# Patient Record
Sex: Female | Born: 1965 | Race: Black or African American | Hispanic: No | Smoking: Current every day smoker
Health system: Southern US, Community
[De-identification: ages and names within clinical notes are randomized; demographics above are authoritative.]

## PROBLEM LIST (undated history)

## (undated) DIAGNOSIS — F319 Bipolar disorder, unspecified: Secondary | ICD-10-CM

## (undated) DIAGNOSIS — G894 Chronic pain syndrome: Secondary | ICD-10-CM

## (undated) DIAGNOSIS — Z460 Encounter for fitting and adjustment of spectacles and contact lenses: Secondary | ICD-10-CM

## (undated) DIAGNOSIS — E785 Hyperlipidemia, unspecified: Secondary | ICD-10-CM

## (undated) DIAGNOSIS — F419 Anxiety disorder, unspecified: Secondary | ICD-10-CM

## (undated) DIAGNOSIS — R61 Generalized hyperhidrosis: Secondary | ICD-10-CM

## (undated) DIAGNOSIS — I10 Essential (primary) hypertension: Secondary | ICD-10-CM

## (undated) DIAGNOSIS — K279 Peptic ulcer, site unspecified, unspecified as acute or chronic, without hemorrhage or perforation: Secondary | ICD-10-CM

## (undated) DIAGNOSIS — K219 Gastro-esophageal reflux disease without esophagitis: Secondary | ICD-10-CM

## (undated) DIAGNOSIS — D509 Iron deficiency anemia, unspecified: Secondary | ICD-10-CM

## (undated) DIAGNOSIS — M75121 Complete rotator cuff tear or rupture of right shoulder, not specified as traumatic: Secondary | ICD-10-CM

## (undated) DIAGNOSIS — M5137 Other intervertebral disc degeneration, lumbosacral region: Secondary | ICD-10-CM

## (undated) HISTORY — DX: Iron deficiency anemia, unspecified: D50.9

## (undated) HISTORY — DX: Hyperlipidemia, unspecified: E78.5

## (undated) HISTORY — DX: Chronic pain syndrome: G89.4

## (undated) HISTORY — DX: Anxiety disorder, unspecified: F41.9

## (undated) HISTORY — DX: Peptic ulcer, site unspecified, unspecified as acute or chronic, without hemorrhage or perforation: K27.9

## (undated) HISTORY — DX: Essential (primary) hypertension: I10

## (undated) HISTORY — DX: Encounter for fitting and adjustment of spectacles and contact lenses: Z46.0

## (undated) HISTORY — PX: TUBAL LIGATION: SHX77

## (undated) HISTORY — PX: BLADDER SURGERY: SHX569

## (undated) HISTORY — DX: Bipolar disorder, unspecified: F31.9

## (undated) HISTORY — DX: Other intervertebral disc degeneration, lumbosacral region: M51.37

## (undated) HISTORY — PX: ABDOMINAL HYSTERECTOMY: SHX81

## (undated) HISTORY — DX: Morbid (severe) obesity due to excess calories: E66.01

## (undated) HISTORY — DX: Generalized hyperhidrosis: R61

---

## 1989-02-23 HISTORY — PX: HAND TENDON SURGERY: SHX663

## 2008-05-13 ENCOUNTER — Observation Stay (HOSPITAL_COMMUNITY): Admission: EM | Admit: 2008-05-13 | Discharge: 2008-05-14 | Payer: Self-pay | Admitting: Emergency Medicine

## 2008-05-22 ENCOUNTER — Other Ambulatory Visit: Payer: Self-pay

## 2008-05-22 ENCOUNTER — Inpatient Hospital Stay (HOSPITAL_COMMUNITY): Admission: RE | Admit: 2008-05-22 | Discharge: 2008-05-25 | Payer: Self-pay | Admitting: Psychiatry

## 2008-05-22 ENCOUNTER — Ambulatory Visit: Payer: Self-pay | Admitting: Psychiatry

## 2008-05-22 ENCOUNTER — Other Ambulatory Visit: Payer: Self-pay | Admitting: Emergency Medicine

## 2008-06-27 ENCOUNTER — Emergency Department (HOSPITAL_COMMUNITY): Admission: EM | Admit: 2008-06-27 | Discharge: 2008-06-27 | Payer: Self-pay | Admitting: Emergency Medicine

## 2008-06-27 ENCOUNTER — Encounter (INDEPENDENT_AMBULATORY_CARE_PROVIDER_SITE_OTHER): Payer: Self-pay | Admitting: *Deleted

## 2008-07-25 ENCOUNTER — Ambulatory Visit (HOSPITAL_COMMUNITY): Admission: RE | Admit: 2008-07-25 | Discharge: 2008-07-25 | Payer: Self-pay | Admitting: Obstetrics and Gynecology

## 2008-08-13 DIAGNOSIS — D259 Leiomyoma of uterus, unspecified: Secondary | ICD-10-CM | POA: Insufficient documentation

## 2008-08-13 DIAGNOSIS — J45909 Unspecified asthma, uncomplicated: Secondary | ICD-10-CM | POA: Insufficient documentation

## 2008-08-13 HISTORY — DX: Leiomyoma of uterus, unspecified: D25.9

## 2008-09-12 ENCOUNTER — Encounter (INDEPENDENT_AMBULATORY_CARE_PROVIDER_SITE_OTHER): Payer: Self-pay | Admitting: Obstetrics and Gynecology

## 2008-09-12 ENCOUNTER — Ambulatory Visit (HOSPITAL_COMMUNITY): Admission: RE | Admit: 2008-09-12 | Discharge: 2008-09-12 | Payer: Self-pay | Admitting: Obstetrics and Gynecology

## 2008-11-23 LAB — CONVERTED CEMR LAB: Pap Smear: NORMAL

## 2009-01-25 ENCOUNTER — Ambulatory Visit: Payer: Self-pay | Admitting: Internal Medicine

## 2009-01-25 DIAGNOSIS — M51379 Other intervertebral disc degeneration, lumbosacral region without mention of lumbar back pain or lower extremity pain: Secondary | ICD-10-CM

## 2009-01-25 DIAGNOSIS — M5137 Other intervertebral disc degeneration, lumbosacral region: Secondary | ICD-10-CM

## 2009-01-25 DIAGNOSIS — F319 Bipolar disorder, unspecified: Secondary | ICD-10-CM

## 2009-01-25 DIAGNOSIS — I10 Essential (primary) hypertension: Secondary | ICD-10-CM

## 2009-01-25 DIAGNOSIS — D509 Iron deficiency anemia, unspecified: Secondary | ICD-10-CM

## 2009-01-25 DIAGNOSIS — E785 Hyperlipidemia, unspecified: Secondary | ICD-10-CM

## 2009-01-25 DIAGNOSIS — M549 Dorsalgia, unspecified: Secondary | ICD-10-CM | POA: Insufficient documentation

## 2009-01-25 DIAGNOSIS — M51369 Other intervertebral disc degeneration, lumbar region without mention of lumbar back pain or lower extremity pain: Secondary | ICD-10-CM | POA: Insufficient documentation

## 2009-01-25 DIAGNOSIS — R5383 Other fatigue: Secondary | ICD-10-CM

## 2009-01-25 DIAGNOSIS — J309 Allergic rhinitis, unspecified: Secondary | ICD-10-CM

## 2009-01-25 DIAGNOSIS — R5381 Other malaise: Secondary | ICD-10-CM | POA: Insufficient documentation

## 2009-01-25 DIAGNOSIS — F17200 Nicotine dependence, unspecified, uncomplicated: Secondary | ICD-10-CM

## 2009-01-25 DIAGNOSIS — M5136 Other intervertebral disc degeneration, lumbar region: Secondary | ICD-10-CM | POA: Insufficient documentation

## 2009-01-25 HISTORY — DX: Essential (primary) hypertension: I10

## 2009-01-25 HISTORY — DX: Dorsalgia, unspecified: M54.9

## 2009-01-25 HISTORY — DX: Other intervertebral disc degeneration, lumbosacral region: M51.37

## 2009-01-25 HISTORY — DX: Iron deficiency anemia, unspecified: D50.9

## 2009-01-25 HISTORY — DX: Morbid (severe) obesity due to excess calories: E66.01

## 2009-01-25 HISTORY — DX: Allergic rhinitis, unspecified: J30.9

## 2009-01-25 HISTORY — DX: Hyperlipidemia, unspecified: E78.5

## 2009-01-25 HISTORY — DX: Nicotine dependence, unspecified, uncomplicated: F17.200

## 2009-01-25 HISTORY — DX: Bipolar disorder, unspecified: F31.9

## 2009-01-25 HISTORY — DX: Other intervertebral disc degeneration, lumbosacral region without mention of lumbar back pain or lower extremity pain: M51.379

## 2009-01-29 LAB — CONVERTED CEMR LAB
ALT: 20 units/L (ref 0–35)
AST: 15 units/L (ref 0–37)
Albumin: 3.3 g/dL — ABNORMAL LOW (ref 3.5–5.2)
Alkaline Phosphatase: 79 units/L (ref 39–117)
BUN: 3 mg/dL — ABNORMAL LOW (ref 6–23)
Basophils Absolute: 0 10*3/uL (ref 0.0–0.1)
Basophils Relative: 0.1 % (ref 0.0–3.0)
Bilirubin Urine: NEGATIVE
Bilirubin, Direct: 0 mg/dL (ref 0.0–0.3)
CO2: 31 meq/L (ref 19–32)
Calcium: 8.7 mg/dL (ref 8.4–10.5)
Chloride: 107 meq/L (ref 96–112)
Cholesterol: 232 mg/dL — ABNORMAL HIGH (ref 0–200)
Creatinine, Ser: 0.7 mg/dL (ref 0.4–1.2)
Direct LDL: 187.8 mg/dL
Eosinophils Absolute: 0.1 10*3/uL (ref 0.0–0.7)
Eosinophils Relative: 2.3 % (ref 0.0–5.0)
Folate: 11.2 ng/mL
GFR calc non Af Amer: 116.59 mL/min (ref >60)
Glucose, Bld: 89 mg/dL (ref 70–99)
HCT: 39.7 % (ref 36.0–46.0)
HDL: 32.8 mg/dL — ABNORMAL LOW (ref >39.00)
Hemoglobin, Urine: NEGATIVE
Hemoglobin: 13.4 g/dL (ref 12.0–15.0)
Iron: 30 ug/dL — ABNORMAL LOW (ref 42–145)
Ketones, ur: NEGATIVE mg/dL
Leukocytes, UA: NEGATIVE
Lymphocytes Relative: 23.1 % (ref 12.0–46.0)
Lymphs Abs: 1.5 10*3/uL (ref 0.7–4.0)
MCHC: 33.7 g/dL (ref 30.0–36.0)
MCV: 88.1 fL (ref 78.0–100.0)
Monocytes Absolute: 0.2 10*3/uL (ref 0.1–1.0)
Monocytes Relative: 3.4 % (ref 3.0–12.0)
Neutro Abs: 4.5 10*3/uL (ref 1.4–7.7)
Neutrophils Relative %: 71.1 % (ref 43.0–77.0)
Nitrite: NEGATIVE
Platelets: 239 10*3/uL (ref 150.0–400.0)
Potassium: 3.7 meq/L (ref 3.5–5.1)
RBC: 4.51 M/uL (ref 3.87–5.11)
RDW: 14.4 % (ref 11.5–14.6)
Saturation Ratios: 8.3 % — ABNORMAL LOW (ref 20.0–50.0)
Sed Rate: 80 mm/hr — ABNORMAL HIGH (ref 0–22)
Sodium: 143 meq/L (ref 135–145)
Specific Gravity, Urine: 1.01 (ref 1.000–1.030)
TSH: 1.54 microintl units/mL (ref 0.35–5.50)
Total Bilirubin: 0.5 mg/dL (ref 0.3–1.2)
Total CHOL/HDL Ratio: 7
Total Protein, Urine: NEGATIVE mg/dL
Total Protein: 7.9 g/dL (ref 6.0–8.3)
Transferrin: 258.5 mg/dL (ref 212.0–360.0)
Triglycerides: 94 mg/dL (ref 0.0–149.0)
Urine Glucose: NEGATIVE mg/dL
Urobilinogen, UA: 0.2 (ref 0.0–1.0)
VLDL: 18.8 mg/dL (ref 0.0–40.0)
Vitamin B-12: 659 pg/mL (ref 211–911)
WBC: 6.3 10*3/uL (ref 4.5–10.5)
pH: 7 (ref 5.0–8.0)

## 2009-02-25 ENCOUNTER — Ambulatory Visit: Payer: Self-pay | Admitting: Internal Medicine

## 2009-02-25 ENCOUNTER — Telehealth: Payer: Self-pay | Admitting: Internal Medicine

## 2009-02-26 ENCOUNTER — Ambulatory Visit (HOSPITAL_COMMUNITY): Admission: RE | Admit: 2009-02-26 | Discharge: 2009-02-26 | Payer: Self-pay | Admitting: Surgery

## 2009-02-26 ENCOUNTER — Encounter (INDEPENDENT_AMBULATORY_CARE_PROVIDER_SITE_OTHER): Payer: Self-pay | Admitting: *Deleted

## 2009-02-27 ENCOUNTER — Ambulatory Visit: Payer: Self-pay | Admitting: Internal Medicine

## 2009-02-28 ENCOUNTER — Encounter: Payer: Self-pay | Admitting: Internal Medicine

## 2009-02-28 ENCOUNTER — Telehealth (INDEPENDENT_AMBULATORY_CARE_PROVIDER_SITE_OTHER): Payer: Self-pay | Admitting: *Deleted

## 2009-03-01 ENCOUNTER — Ambulatory Visit: Payer: Self-pay | Admitting: Internal Medicine

## 2009-03-01 ENCOUNTER — Telehealth: Payer: Self-pay | Admitting: Internal Medicine

## 2009-03-01 LAB — CONVERTED CEMR LAB
HCV Ab: NEGATIVE
Hep A IgM: NEGATIVE
Hep B C IgM: NEGATIVE
Hepatitis B Surface Ag: NEGATIVE
Rubella: 24.9 intl units/mL — ABNORMAL HIGH
Rubeola IgG: 2.03 — ABNORMAL HIGH

## 2009-03-15 ENCOUNTER — Telehealth: Payer: Self-pay | Admitting: Internal Medicine

## 2009-03-29 ENCOUNTER — Ambulatory Visit: Payer: Self-pay | Admitting: Internal Medicine

## 2009-04-02 ENCOUNTER — Telehealth: Payer: Self-pay | Admitting: Internal Medicine

## 2009-04-30 ENCOUNTER — Ambulatory Visit: Payer: Self-pay | Admitting: Internal Medicine

## 2009-04-30 DIAGNOSIS — G4700 Insomnia, unspecified: Secondary | ICD-10-CM

## 2009-04-30 HISTORY — DX: Insomnia, unspecified: G47.00

## 2009-05-31 ENCOUNTER — Ambulatory Visit: Payer: Self-pay | Admitting: Internal Medicine

## 2009-05-31 DIAGNOSIS — R51 Headache: Secondary | ICD-10-CM | POA: Insufficient documentation

## 2009-05-31 DIAGNOSIS — R519 Headache, unspecified: Secondary | ICD-10-CM | POA: Insufficient documentation

## 2009-06-03 ENCOUNTER — Encounter (INDEPENDENT_AMBULATORY_CARE_PROVIDER_SITE_OTHER): Payer: Self-pay | Admitting: *Deleted

## 2009-06-04 ENCOUNTER — Telehealth (INDEPENDENT_AMBULATORY_CARE_PROVIDER_SITE_OTHER): Payer: Self-pay | Admitting: *Deleted

## 2009-07-02 ENCOUNTER — Ambulatory Visit: Payer: Self-pay | Admitting: Internal Medicine

## 2009-07-15 ENCOUNTER — Telehealth: Payer: Self-pay | Admitting: Internal Medicine

## 2009-07-16 ENCOUNTER — Telehealth: Payer: Self-pay | Admitting: Internal Medicine

## 2009-07-26 ENCOUNTER — Ambulatory Visit: Payer: Self-pay | Admitting: Internal Medicine

## 2009-09-16 ENCOUNTER — Ambulatory Visit: Payer: Self-pay | Admitting: Internal Medicine

## 2009-10-03 ENCOUNTER — Telehealth: Payer: Self-pay | Admitting: Internal Medicine

## 2009-10-03 DIAGNOSIS — K279 Peptic ulcer, site unspecified, unspecified as acute or chronic, without hemorrhage or perforation: Secondary | ICD-10-CM

## 2009-10-03 HISTORY — DX: Peptic ulcer, site unspecified, unspecified as acute or chronic, without hemorrhage or perforation: K27.9

## 2009-10-04 ENCOUNTER — Encounter: Payer: Self-pay | Admitting: Gastroenterology

## 2009-10-15 ENCOUNTER — Encounter: Admission: RE | Admit: 2009-10-15 | Discharge: 2009-11-21 | Payer: Self-pay | Admitting: Surgery

## 2009-11-06 ENCOUNTER — Encounter (INDEPENDENT_AMBULATORY_CARE_PROVIDER_SITE_OTHER): Payer: Self-pay | Admitting: *Deleted

## 2009-11-06 ENCOUNTER — Ambulatory Visit: Payer: Self-pay | Admitting: Internal Medicine

## 2009-11-06 DIAGNOSIS — F1910 Other psychoactive substance abuse, uncomplicated: Secondary | ICD-10-CM | POA: Insufficient documentation

## 2009-11-06 HISTORY — DX: Other psychoactive substance abuse, uncomplicated: F19.10

## 2009-11-06 LAB — CONVERTED CEMR LAB
ALT: 14 units/L (ref 0–35)
AST: 15 units/L (ref 0–37)
Albumin: 3.3 g/dL — ABNORMAL LOW (ref 3.5–5.2)
Alkaline Phosphatase: 87 units/L (ref 39–117)
Amphetamine Screen, Ur: NEGATIVE
BUN: 8 mg/dL (ref 6–23)
Barbiturate Quant, Ur: NEGATIVE
Basophils Absolute: 0 10*3/uL (ref 0.0–0.1)
Basophils Relative: 0.1 % (ref 0.0–3.0)
Benzodiazepines.: NEGATIVE
Bilirubin Urine: NEGATIVE
Bilirubin, Direct: 0.1 mg/dL (ref 0.0–0.3)
CO2: 26 meq/L (ref 19–32)
Calcium: 9.2 mg/dL (ref 8.4–10.5)
Chloride: 109 meq/L (ref 96–112)
Cholesterol: 244 mg/dL — ABNORMAL HIGH (ref 0–200)
Cocaine Metabolites: NEGATIVE
Creatinine, Ser: 0.7 mg/dL (ref 0.4–1.2)
Creatinine,U: 162.7 mg/dL
Direct LDL: 184.8 mg/dL
Eosinophils Absolute: 0.2 10*3/uL (ref 0.0–0.7)
Eosinophils Relative: 2.4 % (ref 0.0–5.0)
Folate: 11.1 ng/mL
GFR calc non Af Amer: 116.18 mL/min (ref >60)
Glucose, Bld: 77 mg/dL (ref 70–99)
HCT: 38 % (ref 36.0–46.0)
HDL: 36.1 mg/dL — ABNORMAL LOW (ref >39.00)
Hemoglobin, Urine: NEGATIVE
Hemoglobin: 13.3 g/dL (ref 12.0–15.0)
Hgb A1c MFr Bld: 5.7 % (ref 4.6–6.5)
Iron: 48 ug/dL (ref 42–145)
Ketones, ur: NEGATIVE mg/dL
Leukocytes, UA: NEGATIVE
Lymphocytes Relative: 23.3 % (ref 12.0–46.0)
Lymphs Abs: 1.7 10*3/uL (ref 0.7–4.0)
MCHC: 35 g/dL (ref 30.0–36.0)
MCV: 86 fL (ref 78.0–100.0)
Marijuana Metabolite: NEGATIVE
Methadone: NEGATIVE
Monocytes Absolute: 0.3 10*3/uL (ref 0.1–1.0)
Monocytes Relative: 4.7 % (ref 3.0–12.0)
Neutro Abs: 5 10*3/uL (ref 1.4–7.7)
Neutrophils Relative %: 69.5 % (ref 43.0–77.0)
Nitrite: NEGATIVE
Opiates: NEGATIVE
Phencyclidine (PCP): NEGATIVE
Platelets: 300 10*3/uL (ref 150.0–400.0)
Potassium: 4.2 meq/L (ref 3.5–5.1)
Propoxyphene: NEGATIVE
RBC: 4.43 M/uL (ref 3.87–5.11)
RDW: 14.8 % — ABNORMAL HIGH (ref 11.5–14.6)
Saturation Ratios: 13.6 % — ABNORMAL LOW (ref 20.0–50.0)
Sodium: 142 meq/L (ref 135–145)
Specific Gravity, Urine: 1.02 (ref 1.000–1.030)
TSH: 1.9 microintl units/mL (ref 0.35–5.50)
Total Bilirubin: 0.6 mg/dL (ref 0.3–1.2)
Total CHOL/HDL Ratio: 7
Total Protein, Urine: NEGATIVE mg/dL
Total Protein: 7.4 g/dL (ref 6.0–8.3)
Transferrin: 252.7 mg/dL (ref 212.0–360.0)
Triglycerides: 151 mg/dL — ABNORMAL HIGH (ref 0.0–149.0)
Urine Glucose: NEGATIVE mg/dL
Urobilinogen, UA: 0.2 (ref 0.0–1.0)
VLDL: 30.2 mg/dL (ref 0.0–40.0)
Vitamin B-12: 470 pg/mL (ref 211–911)
WBC: 7.2 10*3/uL (ref 4.5–10.5)
pH: 5.5 (ref 5.0–8.0)

## 2009-11-19 ENCOUNTER — Ambulatory Visit: Payer: Self-pay | Admitting: Gastroenterology

## 2009-11-20 ENCOUNTER — Ambulatory Visit: Payer: Self-pay | Admitting: Gastroenterology

## 2009-11-22 ENCOUNTER — Encounter: Payer: Self-pay | Admitting: Physician Assistant

## 2009-12-05 ENCOUNTER — Telehealth: Payer: Self-pay | Admitting: Physician Assistant

## 2009-12-25 ENCOUNTER — Telehealth: Payer: Self-pay | Admitting: Gastroenterology

## 2010-01-21 ENCOUNTER — Telehealth: Payer: Self-pay | Admitting: Internal Medicine

## 2010-01-27 ENCOUNTER — Ambulatory Visit (HOSPITAL_COMMUNITY)
Admission: RE | Admit: 2010-01-27 | Discharge: 2010-01-27 | Payer: Self-pay | Source: Home / Self Care | Admitting: Surgery

## 2010-02-12 ENCOUNTER — Encounter: Payer: Self-pay | Admitting: Internal Medicine

## 2010-02-27 ENCOUNTER — Ambulatory Visit (HOSPITAL_COMMUNITY)
Admission: RE | Admit: 2010-02-27 | Discharge: 2010-02-27 | Payer: Self-pay | Source: Home / Self Care | Attending: Internal Medicine | Admitting: Internal Medicine

## 2010-02-27 LAB — HM MAMMOGRAPHY: HM Mammogram: NEGATIVE

## 2010-03-16 ENCOUNTER — Encounter: Payer: Self-pay | Admitting: Surgery

## 2010-03-25 NOTE — Assessment & Plan Note (Signed)
Summary: DISCUSS WEIGHT LOSS AND DISABILITY/NWS  #   Vital Signs:  Patient profile:   45 year old female Height:      63 inches Weight:      261 pounds BMI:     46.40 O2 Sat:      96 % on Room air Temp:     97.6 degrees F oral Pulse rate:   88 / minute Pulse rhythm:   regular Resp:     16 per minute BP sitting:   130 / 78  (left arm) Cuff size:   large  Vitals Entered By: Rock Nephew CMA (May 31, 2009 1:14 PM)  Nutrition Counseling: Patient's BMI is greater than 25 and therefore counseled on weight management options.  O2 Flow:  Room air CC: discuss headache x3wks w/ fatgue and "out burst", Hypertension Management   Primary Care Provider:  Corwin Levins MD  CC:  discuss headache x3wks w/ fatgue and "out burst" and Hypertension Management.  History of Present Illness: New to me this pleasant young female says that she was supposed to see Dr. Jonny Ruiz today. She has chronic daily headaches and saw a HA specialist in [place] and wants a referral to a new HA specialist. She needs a letter today stating that she is treated at [facility] for bipolar disease. Her next appt. there is in one month. She was admiited to Psych. last November for suicidal ideations and has been doing well under their care since then. She is scheduled for a lap-band procedure soon and says that she is following a diet given to her by Dr. Jonny Ruiz diligently. She does not exercise due to LBP from an old worker's comp. injury.  Hypertension History:      She denies headache, chest pain, palpitations, dyspnea with exertion, orthopnea, PND, peripheral edema, visual symptoms, neurologic problems, syncope, and side effects from treatment.  She notes no problems with any antihypertensive medication side effects.        Positive major cardiovascular risk factors include hyperlipidemia, hypertension, and current tobacco user.  Negative major cardiovascular risk factors include female age less than 76 years old, no history of diabetes,  and negative family history for ischemic heart disease.        Further assessment for target organ damage reveals no history of ASHD, cardiac end-organ damage (CHF/LVH), stroke/TIA, peripheral vascular disease, renal insufficiency, or hypertensive retinopathy.     Current Medications (verified): 1)  Gabapentin 400 Mg Caps (Gabapentin) .Marland Kitchen.. 1 By Mouth Once Daily 2)  Clonazepam 0.5 Mg Tabs (Clonazepam) .... Three Times A Day As Needed 3)  Lexapro 10 Mg Tabs (Escitalopram Oxalate) .Marland Kitchen.. 1 By Mouth Every Morning 4)  Hydrocodone-Acetaminophen 5-325 Mg Tabs (Hydrocodone-Acetaminophen) .Marland Kitchen.. 1 By Mouth Three Times A Day As Needed 5)  Losartan Potassium 100 Mg Tabs (Losartan Potassium) .Marland Kitchen.. 1 By Mouth Once Daily 6)  Seroquel 50 Mg Tabs (Quetiapine Fumarate) .Marland Kitchen.. 1 By Mouth At Bedtime 7)  Phentermine Hcl 37.5 Mg Caps (Phentermine Hcl) .Marland Kitchen.. 1po Once Daily 8)  Cephalexin 500 Mg Caps (Cephalexin) .Marland Kitchen.. 1 By Mouth Three Times A Day 9)  Meclizine Hcl 12.5 Mg Tabs (Meclizine Hcl) .Marland Kitchen.. 1 - 2 By Mouth Q 6 Hrs As Needed 10)  Promethazine Hcl 25 Mg Tabs (Promethazine Hcl) .Marland Kitchen.. 1 By Mouth Q 6 Hrs As Needed Nausea  Allergies (verified): No Known Drug Allergies  Past History:  Past Medical History: Reviewed history from 01/25/2009 and no changes required. Anemia-iron deficiency Bipolar Illness Allergic rhinitis Hyperlipidemia  Hypertension HIV neg 2001 Lumbar disc disease chronic pain syndrome chronic smoker morbid obesity  Past Surgical History: Reviewed history from 01/25/2009 and no changes required. s/p bladder surgury with ? diverticulitis Hysterectomy Tubal ligation s/p right hand tendon surgury- index and middle  Family History: Reviewed history from 01/25/2009 and no changes required. father died iwth ETOH cirrhosis, stroke, HTN mother with HTN sister and p-aunt overwt  and HTN aunt with DM uncle with DM maternal - several with emotional illness but not dx or tx son and 3 nephews with  ADHD, and emotional issues  Social History: Reviewed history from 01/25/2009 and no changes required. moved from [place], then [place] to [place] 2009 Divorced 2 chidren disabled - bipolar Current Smoker Alcohol use-no no illiict drugs  Review of Systems       The patient complains of weight gain.  The patient denies chest pain, abdominal pain, and depression.   Psych:  Denies alternate hallucination ( auditory/visual), anxiety, depression, easily angered, easily tearful, irritability, mental problems, panic attacks, sense of great danger, suicidal thoughts/plans, thoughts of violence, and unusual visions or sounds.  Physical Exam  General:  alert, well-developed, well-nourished, well-hydrated, appropriate dress, cooperative to examination, and overweight-appearing.   Head:  normocephalic, atraumatic, no abnormalities observed, and no abnormalities palpated.   Eyes:  vision grossly intact, pupils equal, pupils round, and pupils reactive to light.   Mouth:  Oral mucosa and oropharynx without lesions or exudates.  Teeth in good repair. Neck:  No deformities, masses, or tenderness noted. Lungs:  Normal respiratory effort, chest expands symmetrically. Lungs are clear to auscultation, no crackles or wheezes. Heart:  Normal rate and regular rhythm. S1 and S2 normal without gallop, murmur, click, rub or other extra sounds. Abdomen:  Bowel sounds positive,abdomen soft and non-tender without masses, organomegaly or hernias noted. Msk:  No deformity or scoliosis noted of thoracic or lumbar spine.   Pulses:  R and L carotid,radial,femoral,dorsalis pedis and posterior tibial pulses are full and equal bilaterally Extremities:  No clubbing, cyanosis, edema, or deformity noted with normal full range of motion of all joints.   Neurologic:  alert & oriented X3, cranial nerves II-XII intact, strength normal in all extremities, sensation intact to light touch, gait normal, DTRs symmetrical and normal,  finger-to-nose normal, heel-to-shin normal, RUE hyporeflexia, RLE hyporeflexia, LUE hyporeflexia, and LLE hyporeflexia.   Skin:  turgor normal, color normal, no rashes, no suspicious lesions, no ecchymoses, no petechiae, no purpura, no ulcerations, and no edema.   Psych:  Cognition and judgment appear intact. Alert and cooperative with normal attention span and concentration. No apparent delusions, illusions, hallucinations   Impression & Recommendations:  Problem # 1:  HEADACHE, CHRONIC (ICD-784.0) Assessment New  Her updated medication list for this problem includes:    Hydrocodone-acetaminophen 5-325 Mg Tabs (Hydrocodone-acetaminophen) .Marland Kitchen... 1 by mouth three times a day as needed  Orders: Neurology Referral (Neuro)  Headache diary reviewed.  Problem # 2:  BACK PAIN, CHRONIC (ICD-724.5) Assessment: Unchanged  Her updated medication list for this problem includes:    Hydrocodone-acetaminophen 5-325 Mg Tabs (Hydrocodone-acetaminophen) .Marland Kitchen... 1 by mouth three times a day as needed  Discussed use of moist heat or ice, modified activities, medications, and stretching/strengthening exercises. Back care instructions given. To be seen in 2 weeks if no improvement; sooner if worsening of symptoms.   Problem # 3:  HYPERTENSION (ICD-401.9) Assessment: Improved  Her updated medication list for this problem includes:    Losartan Potassium 100 Mg Tabs (Losartan potassium) .Marland KitchenMarland KitchenMarland KitchenMarland Kitchen  1 by mouth once daily  BP today: 130/78 Prior BP: 120/80 (04/30/2009)  Labs Reviewed: K+: 3.7 (01/25/2009) Creat: : 0.7 (01/25/2009)   Chol: 232 (01/25/2009)   HDL: 32.80 (01/25/2009)   TG: 94.0 (01/25/2009)  Complete Medication List: 1)  Gabapentin 400 Mg Caps (Gabapentin) .Marland Kitchen.. 1 by mouth once daily 2)  Clonazepam 0.5 Mg Tabs (Clonazepam) .... Three times a day as needed 3)  Lexapro 10 Mg Tabs (Escitalopram oxalate) .Marland Kitchen.. 1 by mouth every morning 4)  Hydrocodone-acetaminophen 5-325 Mg Tabs  (Hydrocodone-acetaminophen) .Marland Kitchen.. 1 by mouth three times a day as needed 5)  Losartan Potassium 100 Mg Tabs (Losartan potassium) .Marland Kitchen.. 1 by mouth once daily 6)  Seroquel 50 Mg Tabs (Quetiapine fumarate) .Marland Kitchen.. 1 by mouth at bedtime 7)  Phentermine Hcl 37.5 Mg Caps (Phentermine hcl) .Marland Kitchen.. 1po once daily 8)  Cephalexin 500 Mg Caps (Cephalexin) .Marland Kitchen.. 1 by mouth three times a day 9)  Meclizine Hcl 12.5 Mg Tabs (Meclizine hcl) .Marland Kitchen.. 1 - 2 by mouth q 6 hrs as needed 10)  Promethazine Hcl 25 Mg Tabs (Promethazine hcl) .Marland Kitchen.. 1 by mouth q 6 hrs as needed nausea  Hypertension Assessment/Plan:      The patient's hypertensive risk group is category B: At least one risk factor (excluding diabetes) with no target organ damage.  Today's blood pressure is 130/78.  Her blood pressure goal is < 140/90.  Patient Instructions: 1)  Please schedule a follow-up appointment in 1 month. 2)  It is important that you exercise regularly at least 20 minutes 5 times a week. If you develop chest pain, have severe difficulty breathing, or feel very tired , stop exercising immediately and seek medical attention. 3)  You need to lose weight. Consider a lower calorie diet and regular exercise.  4)  Check your Blood Pressure regularly. If it is above 140/90: you should make an appointment.

## 2010-03-25 NOTE — Letter (Signed)
Summary: [facility] Consult Scheduled Letter  [facility]  [address] [place], [place] [address]   Phone: [phone]  Fax: [phone]      06/03/2009 MRN: [id number]  Shannon [name] [address] [place], [place]  [address]    Dear [name],      We have scheduled an appointment for you.  At the recommendation of Dr.Jones, we have scheduled you a consult with Dr Clarisse Gouge on 06/27/09 at 2:25pm.  Their phone number is [phone].  If this appointment day and time is not convenient for you, please feel free to call the office of the doctor you are being referred to at the number listed above and reschedule the appointment.     Dr Rubye Beach [address] [place] [address] [place], [place] [address]     Thank you,  Patient Care Coordinator [facility]

## 2010-03-25 NOTE — Progress Notes (Signed)
----   Converted from flag ---- ---- 02/25/2009 3:15 PM, James W John MD wrote: that's what I thought must have happened; it appears the pt has been the one who has actually been more resposible for not making the appt  ---- 02/25/2009 3:15 PM, Mary Phillips wrote: Dr John, I called [facility] to ck on status of pt referral, [facility] said that they have left pt 2 messages to call about appt. I called pt and left message for pt to call me about referral.  ---- 02/25/2009 2:02 PM, James W John MD wrote: please find out why pt has not been notified regarding the dietary clinic referral - pt still has not been called and referral done dec 3 ------------------------------ 

## 2010-03-25 NOTE — Progress Notes (Signed)
----   Converted from flag ---- ---- 02/27/2009 1:10 PM, Corwin Levins MD wrote: ok for measles titer, mumps IgG, rubella, acute hepatitis panel   there is no titer for the meningococcal  - she just has to decide if she wants it  please convert to phone note   ---- 02/27/2009 10:20 AM, Margaret Pyle, CMA wrote: pt is requesting lab order to have titer done for MMR, Hep A & B and meningoccocal please. Thank you ------------------------------  Appended Document:  pt would like to have titers done today if possible and she will have meningococcal vaccination as well.  Appended Document:  ok - orders done hardcopy to [facility] side B - dahlia   Appended Document:  entered in IDX pt notified

## 2010-03-25 NOTE — Progress Notes (Signed)
Phone Note Call from Patient Call back at [facility] Phone [phone]   Caller: Patient Call For: Dr Jonny Ruiz Summary of Call: Pt called VERY upset. Pt states she has tried to obtain lab results for 2 wks. Pt very upset, frustrated. Pt states she is going to come to the office after her class to expess how upset she is. I told her she could leave a message on triage or a message for the manager. Pt states she has left messages or people tell her they will call her back and she has yet to receive a phone call. Initial call taken by: Verdell Face,  March 15, 2009 9:30 AM  Follow-up for Phone Call        pt was scheduled to have Meninicoccal vac on 03/01/2009 (see note 02/28/2009) . From what I understand, pt came in as scheduled to have vac and was told that she was not supposed to have vaccination but to go to lab for Titer. please advise Follow-up by: Margaret Pyle, CMA,  March 15, 2009 11:58 AM  Additional Follow-up for Phone Call Additional follow up Details #1::        I am confused, b/c I have left messages on phonetree, as well as responded to messages appropriately (as documented on the EMR) , just as we do for all other patients;    To summarize;  her tests were positive for MMR so she will not need that vaccination;  she needs to make appt for the meningococcal vaccine as there is not test for this so SHE has to decide if she wants it;    Please also let patient I personally am not interested in doing the [facility] "medically supervised wt loss" program, so no need to make her appt for that;  I regret to say as well, that due to our problems with comminucation, it appears that I am not able to continue effectively as her Primary Care Provider.   She is not being dismissed from the practice, but please inform pt that I will not continue as her Primary Care Provider after the next 30 days, and she should seek care with another provider of her choice.  I do not have "grounds"  to dismiss her from the practice, but I would suggest a different medical group, such as [facility]. Additional Follow-up by: Corwin Levins MD,  March 15, 2009 1:13 PM    Additional Follow-up for Phone Call Additional follow up Details #2::    Pt came into clinic today and I was asked to speak with her (see previous note). I asked pt to describe to me the events that happened as she beleives it to be. Pt stated to me that she has been calling our office for 3 weeks trying to get the results from the titer she had done. she said that everytime she called she was told that no one knew what she was talking about but they would call her back, she says she was never called back. (She stated a few times that she was told that I would call her back, I never received any such message via staff or Triage).  I then told pt that it shows in EMR her results were on the phone tree and she said that she was never told that and she did not even know what a PT was. She also said that she does not understand why we would not give her a copy of her labs so she  can provide it to her employer so she could start working.  I advised pt that in the future she could call and leave a message on Triage and request to have her lab results mailed to her and it would be done but she said she would never come back to [facility] and that she will make a complaint about "unprofessional behaviour of staff". I asked pt to tell me if she ever left a message on an answering machine with our office and she said that she spoke to different people everytime. The pt was very jumpy and figety while speaking with me, at one she just stood up suddenly. At times she seemed very upset and then she would speak in a whsper.  I was not bale to help with resolving this situation as pt was clear that [facility] "refused to give her the labs results" and that if she did not come up to the office she never would have been able to get her results. I again  apologized to the pt but at that point she suddenly got up and left the room. Follow-up by: Margaret Pyle, CMA,  March 19, 2009 1:27 PM

## 2010-03-25 NOTE — Progress Notes (Signed)
Summary: Weight Loss Form  Weight Loss Form   Imported By: Sherian Rein 12/04/2009 14:21:28  _____________________________________________________________________  External Attachment:    Type:   Image     Comment:   External Document

## 2010-03-25 NOTE — Progress Notes (Signed)
Summary: Neuro referral  Phone Note Outgoing Call   Call placed by: Dagoberto Reef,  June 04, 2009 11:15 AM Summary of Call: Dr Yetta Barre , Dr Clarisse Gouge office do not take medicaid. Please advise.   Thanks Initial call taken by: Dagoberto Reef,  June 04, 2009 11:17 AM  Follow-up for Phone Call        send to another neurologist Follow-up by: Etta Grandchild MD,  June 04, 2009 11:24 AM  Additional Follow-up for Phone Call Additional follow up Details #1::        Faxed to [facility], awaiting appt. Additional Follow-up by: Dagoberto Reef,  June 04, 2009 11:42 AM

## 2010-03-25 NOTE — Progress Notes (Signed)
Summary: breath test  Phone Note Call from Patient Call back at Home Phone [phone]   Caller: Patient Call For: Dr. Jarold Motto Reason for Call: Talk to Nurse Summary of Call: pt had positive breath test, has completed meds... would like a re-test per surgeon's orders Initial call taken by: Vallarie Mare,  December 25, 2009 11:36 AM  Follow-up for Phone Call        advised pt that [facility] can retest her and she should contact them. Follow-up by: Harlow Mares CMA Duncan Dull),  December 25, 2009 11:44 AM

## 2010-03-25 NOTE — Progress Notes (Signed)
Summary: Unable to afford Pylera  Phone Note Call from Patient Call back at Home Phone [phone]   Call For: Mike Gip, PA Summary of Call: Unable to afford the $530 for the Pylera. What else can she take to clear up bacteria so she can have her surgery? Initial call taken by: Leanor Kail [facility],  December 05, 2009 9:42 AM  Follow-up for Phone Call        pt aware  Follow-up by: Harlow Mares CMA Duncan Dull),  December 05, 2009 1:56 PM    New/Updated Medications: BIAXIN 500 MG TABS (CLARITHROMYCIN) take one by mouth two times a day for 10 days OMEPRAZOLE 20 MG CPDR (OMEPRAZOLE) take one by mouth two times a day for 10 days METRONIDAZOLE 250 MG TABS (METRONIDAZOLE) take one by mouth four times a day for 10 days. Prescriptions: METRONIDAZOLE 250 MG TABS (METRONIDAZOLE) take one by mouth four times a day for 10 days.  #40 x 0   Entered by:   Harlow Mares CMA (AAMA)   Authorized by:   Sammuel Cooper PA-c   Signed by:   Harlow Mares CMA (AAMA) on 12/05/2009   Method used:   Electronically to        [facility]  [address] [id number] (retail)       [address]       [place], [place]  [address]       Ph: [phone] or [phone]       Fax: [phone]   RxID:   [id number] BIAXIN 500 MG TABS (CLARITHROMYCIN) take one by mouth two times a day for 10 days  #20 x 0   Entered by:   Harlow Mares CMA (AAMA)   Authorized by:   Sammuel Cooper PA-c   Signed by:   Harlow Mares CMA (AAMA) on 12/05/2009   Method used:   Electronically to        [facility]  [address] [id number] (retail[facility]       [address]       [place], [place]  [address]       Ph: [phone] or [phone]       Fax: [phone]   RxID:   [id number]

## 2010-03-25 NOTE — Assessment & Plan Note (Signed)
Summary: PEPTIC ULCER DISEASE/YF   History of Present Illness Visit Type: consult Primary GI MD: Sheryn Bison MD FACP FAGA Primary Margareta Laureano: Etta Grandchild MD Requesting Teagyn Fishel: Sanda Linger, MD Chief Complaint: pt does not have any complaints at this time, pt needs radiology referral to continue lap band surgery process History of Present Illness:   45 year old American female into as planned LAP-BAND bariatric surgery by Dr. Ovidio Kin. She has had a negative upper GI series and upper abdominal ultrasound exam. Her only GI complaint his gas and bloating after eating spicy foods. She apparently has had previous endoscopic procedures in [place]. Today she denies other GI complaints this time. She is sent by Dr. Yetta Barre for breath testing for H. pylori.  She has a history of bipolar disorder and is on multiple inotropic medications. She also has a history of chronic pain syndrome, iron deficiency anemia with previous partial hysterectomy, and lumbar disc disease. She denies any history of fatty liver or other hepatic abnormalities. Family history remarkable for alcohol liver-induced damage-psoriasis in her father. Patient denies ethanol or IV drug use, quit smoking this year.  CT Scan of the abdomen in May of 2010  and was negative except for degenerating uterine fibroid.   GI Review of Systems    Reports acid reflux, belching, bloating, dysphagia with solids, heartburn, nausea, and  weight gain.      Denies abdominal pain, chest pain, dysphagia with liquids, loss of appetite, vomiting, vomiting blood, and  weight loss.      Reports hemorrhoids.     Denies anal fissure, black tarry stools, change in bowel habit, constipation, diarrhea, diverticulosis, fecal incontinence, heme positive stool, irritable bowel syndrome, jaundice, light color stool, liver problems, rectal bleeding, and  rectal pain.    Current Medications (verified): 1)  Gabapentin 400 Mg Caps (Gabapentin) .Marland Kitchen.. 1  By Mouth Once Daily 2)  Clonazepam 0.5 Mg Tabs (Clonazepam) .... Three Times A Day As Needed 3)  Seroquel Xr 50 Mg Xr24h-Tab (Quetiapine Fumarate) .... 2tabs Two Times A Day 4)  Seroquel 25 Mg Tabs (Quetiapine Fumarate) .... 2 Tabs At Bedtime 5)  Gas-X 80 Mg Chew (Simethicone) .... As Needed  Allergies (verified): No Known Drug Allergies  Past History:  Past medical, surgical, family and social histories (including risk factors) reviewed for relevance to current acute and chronic problems.  Past Medical History: Reviewed history from 01/25/2009 and no changes required. Anemia-iron deficiency Bipolar Illness Allergic rhinitis Hyperlipidemia Hypertension HIV neg 2001 Lumbar disc disease chronic pain syndrome chronic smoker morbid obesity  Past Surgical History: Reviewed history from 01/25/2009 and no changes required. s/p bladder surgury with ? diverticulitis Hysterectomy Tubal ligation s/p right hand tendon surgury- index and middle  Family History: Reviewed history from 01/25/2009 and no changes required. father died iwth ETOH cirrhosis, stroke, HTN mother with HTN sister and p-aunt overwt  and HTN aunt with DM uncle with DM maternal - several with emotional illness but not dx or tx son and 3 nephews with ADHD, and emotional issues Family History of Diabetes: MGM  Social History: Reviewed history from 01/25/2009 and no changes required. moved from [place], then [place] to [place] 2009 Divorced 2 chidren, 1 boy, 1 girl disabled - bipolar Current Smoker--pt states she quit in 2011 Alcohol use-no no illiict drugs Daily Caffeine Use 1 cup/day  Review of Systems       The patient complains of allergy/sinus, back pain, swelling of feet/legs, thirst - excessive, and urine leakage.  The  patient denies anemia, anxiety-new, arthritis/joint pain, blood in urine, breast changes/lumps, confusion, cough, coughing up blood, depression-new, fainting, fatigue, fever,  headaches-new, hearing problems, heart murmur, heart rhythm changes, itching, menstrual pain, muscle pains/cramps, night sweats, nosebleeds, pregnancy symptoms, shortness of breath, skin rash, sleeping problems, sore throat, swollen lymph glands, urination - excessive, urination changes/pain, vision changes, and voice change.    Vital Signs:  Patient profile:   45 year old female Height:      63 inches Weight:      275 pounds BMI:     48.89 Pulse rate:   104 / minute Pulse rhythm:   regular BP sitting:   122 / 82  (left arm) Cuff size:   large  Vitals Entered By: Francee Piccolo CMA Duncan Dull) (November 19, 2009 9:49 AM)  Physical Exam  General:  Well developed, well nourished, no acute distress.obese.   Head:  Normocephalic and atraumatic. Eyes:  PERRLA, no icterus.exam deferred to patient's ophthalmologist.   Lungs:  Clear throughout to auscultation. Heart:  Regular rate and rhythm; no murmurs, rubs,  or bruits. Abdomen:  Soft, nontender and nondistended. No masses, hepatosplenomegaly or hernias noted. Normal bowel sounds.obese.   Msk:  Symmetrical with no gross deformities. Normal posture. Extremities:  No clubbing, cyanosis, edema or deformities noted. Neurologic:  Alert and  oriented x4;  grossly normal neurologically. Cervical Nodes:  No significant cervical adenopathy. Psych:  Alert and cooperative. Normal mood and affect.   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 We will check breath test analysis for H. pylori as requested. I see no need for repeat endoscopic exam at this time. Orders: Urea Breath Test (UBT)  Problem # 2:  MORBID OBESITY (ICD-278.01) Assessment: Unchanged Apparently she has completed all of her psychological and dietary evaluations and is scheduled for lap  band bariatric surgery in the near future. Her surgeon is Dr. Ovidio Kin. She has no specific questions concerning her surgery or possible complications at this time. Apparently she had done a lot of self education concerning this procedure, and she has been evaluated extensively by psychology. It is unclear to me if she is under psychiatric care.BMI today is 49.  Problem # 3:  MANIC DEPRESSIVE ILLNESS (ICD-296.80) Assessment: Unchanged Continue current meds with plan psychological followup per bariatric protocol. Mondaya clot in  Patient Instructions: 1)  Copy sent to : Etta Grandchild MD 2)  Your Urea Breath Test is scheduled for tomorrow please come to the 3rd floor of the [facility], we have provided you seperate instructions. 3)  The medication list was reviewed and reconciled.  All changed / newly prescribed medications were explained.  A complete medication list was provided to the patient / caregiver.

## 2010-03-25 NOTE — Assessment & Plan Note (Signed)
Summary: discuss lap band surgery/letter?   Vital Signs:  Patient profile:   45 year old female Height:      63 inches Weight:      274.50 pounds BMI:     48.80 O2 Sat:      96 % on Room air Temp:     98.6 degrees F oral Pulse rate:   90 / minute Pulse rhythm:   regular Resp:     16 per minute BP sitting:   140 / 90  (left arm) Cuff size:   small  Vitals Entered By: Rock Nephew CMA (November 06, 2009 2:24 PM)  Nutrition Counseling: Patient's BMI is greater than 25 and therefore counseled on weight management options.  O2 Flow:  Room air  Primary Care Provider:  Etta Grandchild MD   History of Present Illness: She returns for f/up and she informs me that she is coming out of a recent manic episode during which she spent $5000, started drinking beer and smoking THC, and was sexually permiscuous. Her Psych. has adjusted her meds and she is doing better. She needs a letter for [facility] for her Lap Band surgery. Also, her Psych wants for her to have labs done to see if she has any complications from her meds.  Preventive Screening-Counseling & Management  Alcohol-Tobacco     Alcohol drinks/day: 1     Alcohol type: beer     >5/day in last 3 mos: no     Alcohol Counseling: to STOP drinking     Feels need to cut down: no     Feels annoyed by complaints: no     Feels guilty re: drinking: no     Needs 'eye opener' in am: no     Smoking Status: quit < 6 months     Smoking Cessation Counseling: yes     Tobacco Counseling: to remain off tobacco products  Hep-HIV-STD-Contraception     Hepatitis Risk: risk noted     Hepatitis Risk Counseling: to avoid increased hepatitis risk     HIV Risk: risk noted     HIV Risk Counseling: to avoid increased HIV risk     STD Risk: risk noted     STD Risk Counseling: to avoid increased STD risk      Sexual History:  multiple partners currently.        Drug Use:  marijuana.        Blood Transfusions:  no.    Medications Prior to Update: 1)   Gabapentin 400 Mg Caps (Gabapentin) .Marland Kitchen.. 1 By Mouth Once Daily 2)  Clonazepam 0.5 Mg Tabs (Clonazepam) .... Three Times A Day As Needed  Current Medications (verified): 1)  Gabapentin 400 Mg Caps (Gabapentin) .Marland Kitchen.. 1 By Mouth Once Daily 2)  Clonazepam 0.5 Mg Tabs (Clonazepam) .... Three Times A Day As Needed 3)  Seroquel Xr 50 Mg Xr24h-Tab (Quetiapine Fumarate) .... 2tabs Two Times A Day 4)  Seroquel 25 Mg Tabs (Quetiapine Fumarate) .... 2 Tabs At Bedtime  Allergies (verified): No Known Drug Allergies  Past History:  Past Medical History: Last updated: 02-04-2009 Anemia-iron deficiency Bipolar Illness Allergic rhinitis Hyperlipidemia Hypertension HIV neg 2001 Lumbar disc disease chronic pain syndrome chronic smoker morbid obesity  Past Surgical History: Last updated: Feb 04, 2009 s/p bladder surgury with ? diverticulitis Hysterectomy Tubal ligation s/p right hand tendon surgury- index and middle  Family History: Last updated: Feb 04, 2009 father died iwth ETOH cirrhosis, stroke, HTN mother with HTN sister and p-aunt overwt  and  HTN aunt with DM uncle with DM maternal - several with emotional illness but not dx or tx son and 3 nephews with ADHD, and emotional issues  Social History: Last updated: 01/25/2009 moved from [place], then [place] to [place] 2009 Divorced 2 chidren disabled - bipolar Current Smoker Alcohol use-no no illiict drugs  Risk Factors: Alcohol Use: 1 (11/06/2009) >5 drinks/d w/in last 3 months: no (11/06/2009) Exercise: yes (09/16/2009)  Risk Factors: Smoking Status: quit < 6 months (11/06/2009)  Family History: Reviewed history from 01/25/2009 and no changes required. father died iwth ETOH cirrhosis, stroke, HTN mother with HTN sister and p-aunt overwt  and HTN aunt with DM uncle with DM maternal - several with emotional illness but not dx or tx son and 3 nephews with ADHD, and emotional issues  Social History: Reviewed history  from 01/25/2009 and no changes required. moved from [place], then [place] to [place] 2009 Divorced 2 chidren disabled - bipolar Current Smoker Alcohol use-no no illiict drugsSexual History:  multiple partners currently Drug Use:  marijuana Hepatitis Risk:  risk noted HIV Risk:  risk noted STD Risk:  risk noted  Review of Systems       The patient complains of weight gain.  The patient denies anorexia, fever, weight loss, chest pain, syncope, dyspnea on exertion, peripheral edema, prolonged cough, headaches, hemoptysis, abdominal pain, hematuria, suspicious skin lesions, and depression.   Psych:  Complains of easily tearful, irritability, and mental problems; denies alternate hallucination ( auditory/visual), anxiety, depression, easily angered, panic attacks, sense of great danger, suicidal thoughts/plans, thoughts of violence, unusual visions or sounds, and thoughts /plans of harming others. Endo:  Denies cold intolerance, excessive hunger, excessive thirst, excessive urination, heat intolerance, polyuria, and weight change. Heme:  Denies abnormal bruising, bleeding, enlarge lymph nodes, fevers, pallor, and skin discoloration.  Physical Exam  General:  alert, well-developed, well-nourished, well-hydrated, and overweight-appearing.   Head:  normocephalic, atraumatic, no abnormalities observed, and no abnormalities palpated.   Mouth:  good dentition, pharynx pink and moist, no erythema, and no posterior lymphoid hypertrophy.   Neck:  No deformities, masses, or tenderness noted. Lungs:  Normal respiratory effort, chest expands symmetrically. Lungs are clear to auscultation, no crackles or wheezes. Heart:  Normal rate and regular rhythm. S1 and S2 normal without gallop, murmur, click, rub or other extra sounds. Abdomen:  Bowel sounds positive,abdomen soft and non-tender without masses, organomegaly or hernias noted. Msk:  No deformity or scoliosis noted of thoracic or lumbar spine.     Pulses:  R and L carotid,radial,femoral,dorsalis pedis and posterior tibial pulses are full and equal bilaterally Extremities:  No clubbing, cyanosis, edema, or deformity noted with normal full range of motion of all joints.   Neurologic:  alert & oriented X3, cranial nerves II-XII intact, strength normal in all extremities, sensation intact to light touch, gait normal, DTRs symmetrical and normal, finger-to-nose normal, heel-to-shin normal, RUE hyporeflexia, RLE hyporeflexia, LUE hyporeflexia, and LLE hyporeflexia.   Skin:  turgor normal, color normal, no rashes, no suspicious lesions, no ecchymoses, no petechiae, no purpura, no ulcerations, and no edema.   Cervical Nodes:  no anterior cervical adenopathy and no posterior cervical adenopathy.   Psych:  Oriented X3, memory intact for recent and remote, normally interactive, good eye contact, not anxious appearing, not depressed appearing, not agitated, not suicidal, and not homicidal.     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) Assessment New  Orders: T-Drug Screen-Urine, (single) (10272-53664)  Problem #  2:  MORBID OBESITY (ICD-278.01) Assessment: Unchanged  Orders: Venipuncture (91478) TLB-Lipid Panel (80061-LIPID) TLB-BMP (Basic Metabolic Panel-BMET) (80048-METABOL) TLB-CBC Platelet - w/Differential (85025-CBCD) TLB-Hepatic/Liver Function Pnl (80076-HEPATIC) TLB-TSH (Thyroid Stimulating Hormone) (84443-TSH) TLB-Udip w/ Micro (81001-URINE) TLB-A1C / Hgb A1C (Glycohemoglobin) (83036-A1C)  Ht: 63 (11/06/2009)   Wt: 274.50 (11/06/2009)   BMI: 48.80 (11/06/2009)  Problem # 3:  HYPERTENSION (ICD-401.9) Assessment: Unchanged  Orders: Venipuncture (29562) TLB-Lipid Panel (80061-LIPID) TLB-BMP (Basic Metabolic Panel-BMET) (80048-METABOL) TLB-CBC Platelet - w/Differential (85025-CBCD) TLB-Hepatic/Liver Function Pnl (80076-HEPATIC) TLB-TSH (Thyroid Stimulating Hormone) (84443-TSH) TLB-Udip w/ Micro  (81001-URINE) TLB-A1C / Hgb A1C (Glycohemoglobin) (83036-A1C)  BP today: 140/90 Prior BP: 124/88 (09/16/2009)  Prior 10 Yr Risk Heart Disease: Not enough information (05/31/2009)  Labs Reviewed: K+: 3.7 (01/25/2009) Creat: : 0.7 (01/25/2009)   Chol: 232 (01/25/2009)   HDL: 32.80 (01/25/2009)   TG: 94.0 (01/25/2009)  Problem # 4:  SMOKER (ICD-305.1) Assessment: Unchanged  Encouraged smoking cessation and discussed different methods for smoking cessation.   Problem # 5:  ANEMIA-IRON DEFICIENCY (ICD-280.9) Assessment: Unchanged  Orders: Venipuncture (13086) TLB-Lipid Panel (80061-LIPID) TLB-BMP (Basic Metabolic Panel-BMET) (80048-METABOL) TLB-CBC Platelet - w/Differential (85025-CBCD) TLB-Hepatic/Liver Function Pnl (80076-HEPATIC) TLB-TSH (Thyroid Stimulating Hormone) (84443-TSH) TLB-Udip w/ Micro (81001-URINE) TLB-A1C / Hgb A1C (Glycohemoglobin) (83036-A1C) TLB-B12 + Folate Pnl (57846_96295-M84/XLK) TLB-IBC Pnl (Iron/FE;Transferrin) (83550-IBC)  Hgb: 13.4 (01/25/2009)   Hct: 39.7 (01/25/2009)   Platelets: 239.0 (01/25/2009) RBC: 4.51 (01/25/2009)   RDW: 14.4 (01/25/2009)   WBC: 6.3 (01/25/2009) MCV: 88.1 (01/25/2009)   MCHC: 33.7 (01/25/2009) Iron: 30 (01/25/2009)   % Sat: 8.3 (01/25/2009) B12: 659 (01/25/2009)   Folate: 11.2 (01/25/2009)   TSH: 1.54 (01/25/2009)  Complete Medication List: 1)  Gabapentin 400 Mg Caps (Gabapentin) .Marland Kitchen.. 1 by mouth once daily 2)  Clonazepam 0.5 Mg Tabs (Clonazepam) .... Three times a day as needed 3)  Seroquel Xr 50 Mg Xr24h-tab (Quetiapine fumarate) .... 2tabs two times a day 4)  Seroquel 25 Mg Tabs (Quetiapine fumarate) .... 2 tabs at bedtime  Patient Instructions: 1)  Please schedule a follow-up appointment in 1 month. 2)  Tobacco is very bad for your health and your loved ones! You Should stop smoking!. 3)  Stop Smoking Tips: Choose a Quit date. Cut down before the Quit date. decide what you will do as a substitute when you feel the urge  to smoke(gum,toothpick,exercise). 4)  It is important that you exercise regularly at least 20 minutes 5 times a week. If you develop chest pain, have severe difficulty breathing, or feel very tired , stop exercising immediately and seek medical attention. 5)  You need to lose weight. Consider a lower calorie diet and regular exercise.  6)  If you could be exposed to sexually transmitted diseases, you should use a condom.

## 2010-03-25 NOTE — Letter (Signed)
Summary: Generic Letter  [facility]  [address] [place], [place] [address]   Phone: [phone]  Fax: [phone]      05/31/2009  MIA MILAN [address] [place], [place]  [address]  Dear [name],   This letter serves to document that the above patient is still being treated for Bipolar Disease at [facility]. She continues to be monitored and treated for Bipolar Disease. For further details aboout the illness please contact [facility].        Sincerely,   Sanda Linger MD

## 2010-03-25 NOTE — Assessment & Plan Note (Signed)
Summary: f/u appt per pt/cd   Vital Signs:  Patient profile:   45 year old female Height:      63 inches Weight:      257.50 pounds BMI:     45.78 O2 Sat:      94 % on Room air Temp:     97.8 degrees F oral Pulse rate:   97 / minute Pulse rhythm:   regular Resp:     16 per minute BP sitting:   118 / 80  (left arm) Cuff size:   large  Vitals Entered By: Rock Nephew CMA (July 26, 2009 1:32 PM)  Nutrition Counseling: Patient's BMI is greater than 25 and therefore counseled on weight management options.  O2 Flow:  Room air CC: fatigue, excessive sleepiness Is Patient Diabetic? No Pain Assessment Patient in pain? no        Primary Care Provider:  Etta Grandchild MD  CC:  fatigue and excessive sleepiness.  History of Present Illness:  Follow-Up Visit      This is a 45 year old woman who presents for Follow-up visit.  The patient denies chest pain, palpitations, dizziness, syncope, edema, SOB, DOE, PND, and orthopnea.  Since the last visit the patient notes problems with medications.  The patient reports taking meds as prescribed and monitoring BP.  When questioned about possible medication side effects, the patient notes fatigue.    Preventive Screening-Counseling & Management  Alcohol-Tobacco     Alcohol drinks/day: 0     Smoking Status: quit < 6 months     Smoking Cessation Counseling: yes     Smoke Cessation Stage: quit     Tobacco Counseling: to remain off tobacco products  Hep-HIV-STD-Contraception     Hepatitis Risk: no risk noted     HIV Risk: no risk noted     STD Risk: no risk noted      Sexual History:  currently monogamous.        Drug Use:  never.        Blood Transfusions:  no.    Medications Prior to Update: 1)  Gabapentin 400 Mg Caps (Gabapentin) .Marland Kitchen.. 1 By Mouth Once Daily 2)  Clonazepam 0.5 Mg Tabs (Clonazepam) .... Three Times A Day As Needed 3)  Lexapro 10 Mg Tabs (Escitalopram Oxalate) .Marland Kitchen.. 1 By Mouth Every Morning 4)   Hydrocodone-Acetaminophen 5-325 Mg Tabs (Hydrocodone-Acetaminophen) .Marland Kitchen.. 1 By Mouth Three Times A Day As Needed 5)  Losartan Potassium 100 Mg Tabs (Losartan Potassium) .Marland Kitchen.. 1 By Mouth Once Daily 6)  Seroquel 50 Mg Tabs (Quetiapine Fumarate) .Marland Kitchen.. 1 By Mouth At Bedtime 7)  Phentermine Hcl 37.5 Mg Caps (Phentermine Hcl) .Marland Kitchen.. 1po Once Daily 8)  Meclizine Hcl 12.5 Mg Tabs (Meclizine Hcl) .Marland Kitchen.. 1 - 2 By Mouth Q 6 Hrs As Needed  Current Medications (verified): 1)  Gabapentin 400 Mg Caps (Gabapentin) .Marland Kitchen.. 1 By Mouth Once Daily 2)  Clonazepam 0.5 Mg Tabs (Clonazepam) .... Three Times A Day As Needed 3)  Lexapro 10 Mg Tabs (Escitalopram Oxalate) .Marland Kitchen.. 1 By Mouth Every Morning 4)  Hydrocodone-Acetaminophen 5-325 Mg Tabs (Hydrocodone-Acetaminophen) .Marland Kitchen.. 1 By Mouth Three Times A Day As Needed 5)  Losartan Potassium 100 Mg Tabs (Losartan Potassium) .Marland Kitchen.. 1 By Mouth Once Daily 6)  Seroquel 50 Mg Tabs (Quetiapine Fumarate) .Marland Kitchen.. 1 By Mouth At Bedtime 7)  Phentermine Hcl 37.5 Mg Caps (Phentermine Hcl) .Marland Kitchen.. 1po Once Daily 8)  Meclizine Hcl 12.5 Mg Tabs (Meclizine Hcl) .Marland Kitchen.. 1 - 2  By Mouth Q 6 Hrs As Needed  Allergies (verified): No Known Drug Allergies  Past History:  Past Medical History: Reviewed history from 01/25/2009 and no changes required. Anemia-iron deficiency Bipolar Illness Allergic rhinitis Hyperlipidemia Hypertension HIV neg 2001 Lumbar disc disease chronic pain syndrome chronic smoker morbid obesity  Past Surgical History: Reviewed history from 01/25/2009 and no changes required. s/p bladder surgury with ? diverticulitis Hysterectomy Tubal ligation s/p right hand tendon surgury- index and middle  Family History: Reviewed history from 01/25/2009 and no changes required. father died iwth ETOH cirrhosis, stroke, HTN mother with HTN sister and p-aunt overwt  and HTN aunt with DM uncle with DM maternal - several with emotional illness but not dx or tx son and 3 nephews with ADHD,  and emotional issues  Social History: Reviewed history from 01/25/2009 and no changes required. moved from [place], then [place] to [place] 2009 Divorced 2 chidren disabled - bipolar Current Smoker Alcohol use-no no illiict drugs  Review of Systems  The patient denies chest pain, syncope, dyspnea on exertion, peripheral edema, prolonged cough, headaches, hemoptysis, abdominal pain, difficulty walking, and depression.   Heme:  Denies abnormal bruising, bleeding, enlarge lymph nodes, fevers, pallor, and skin discoloration.  Physical Exam  General:  alert, well-developed, well-nourished, well-hydrated, and overweight-appearing.   Head:  normocephalic, atraumatic, no abnormalities observed, and no abnormalities palpated.   Mouth:  good dentition, pharynx pink and moist, no erythema, and no posterior lymphoid hypertrophy.   Neck:  No deformities, masses, or tenderness noted. Lungs:  Normal respiratory effort, chest expands symmetrically. Lungs are clear to auscultation, no crackles or wheezes. Heart:  Normal rate and regular rhythm. S1 and S2 normal without gallop, murmur, click, rub or other extra sounds. Abdomen:  Bowel sounds positive,abdomen soft and non-tender without masses, organomegaly or hernias noted. Msk:  No deformity or scoliosis noted of thoracic or lumbar spine.   Pulses:  R and L carotid,radial,femoral,dorsalis pedis and posterior tibial pulses are full and equal bilaterally Extremities:  No clubbing, cyanosis, edema, or deformity noted with normal full range of motion of all joints.   Neurologic:  alert & oriented X3, cranial nerves II-XII intact, strength normal in all extremities, sensation intact to light touch, gait normal, DTRs symmetrical and normal, finger-to-nose normal, heel-to-shin normal, RUE hyporeflexia, RLE hyporeflexia, LUE hyporeflexia, and LLE hyporeflexia.   Skin:  turgor normal, color normal, no rashes, no suspicious lesions, no ecchymoses, no petechiae,  no purpura, no ulcerations, and no edema.   Psych:  Cognition and judgment appear intact. Alert and cooperative with normal attention span and concentration. No apparent delusions, illusions, hallucinations   Impression & Recommendations:  Problem # 1:  FATIGUE (ICD-780.79) stop some meds that may be causing this   Problem # 2:  HYPERTENSION (ICD-401.9) Assessment: Improved  Her updated medication list for this problem includes:    Losartan Potassium 100 Mg Tabs (Losartan potassium) .Marland Kitchen... 1 by mouth once daily  BP today: 118/80 Prior BP: 130/86 (07/02/2009)  Prior 10 Yr Risk Heart Disease: Not enough information (05/31/2009)  Labs Reviewed: K+: 3.7 (01/25/2009) Creat: : 0.7 (01/25/2009)   Chol: 232 (01/25/2009)   HDL: 32.80 (01/25/2009)   TG: 94.0 (01/25/2009)  Problem # 3:  ANEMIA-IRON DEFICIENCY (ICD-280.9) Assessment: Improved  Hgb: 13.4 (01/25/2009)   Hct: 39.7 (01/25/2009)   Platelets: 239.0 (01/25/2009) RBC: 4.51 (01/25/2009)   RDW: 14.4 (01/25/2009)   WBC: 6.3 (01/25/2009) MCV: 88.1 (01/25/2009)   MCHC: 33.7 (01/25/2009) Iron: 30 (01/25/2009)   % Sat:  8.3 (01/25/2009) B12: 659 (01/25/2009)   Folate: 11.2 (01/25/2009)   TSH: 1.54 (01/25/2009)  Complete Medication List: 1)  Gabapentin 400 Mg Caps (Gabapentin) .Marland Kitchen.. 1 by mouth once daily 2)  Clonazepam 0.5 Mg Tabs (Clonazepam) .... Three times a day as needed 3)  Lexapro 10 Mg Tabs (Escitalopram oxalate) .Marland Kitchen.. 1 by mouth every morning 4)  Losartan Potassium 100 Mg Tabs (Losartan potassium) .Marland Kitchen.. 1 by mouth once daily 5)  Phentermine Hcl 37.5 Mg Caps (Phentermine hcl) .Marland Kitchen.. 1po once daily  Patient Instructions: 1)  Please schedule a follow-up appointment in 2 months. 2)  It is important that you exercise regularly at least 20 minutes 5 times a week. If you develop chest pain, have severe difficulty breathing, or feel very tired , stop exercising immediately and seek medical attention. 3)  You need to lose weight. Consider a  lower calorie diet and regular exercise.  4)  Check your Blood Pressure regularly. If it is above 130/80: you should make an appointment.

## 2010-03-25 NOTE — Progress Notes (Signed)
----   Converted from flag ---- ---- 02/25/2009 3:15 PM, Corwin Levins MD wrote: that's what I thought must have happened; it appears the pt has been the one who has actually been more resposible for not making the appt  ---- 02/25/2009 3:15 PM, Dagoberto Reef wrote: Dr Jonny Ruiz, I called [facility] to ck on status of pt referral, [facility] said that they have left pt 2 messages to call about appt. I called pt and left message for pt to call me about referral.  ---- 02/25/2009 2:02 PM, Corwin Levins MD wrote: please find out why pt has not been notified regarding the dietary clinic referral - pt still has not been called and referral done dec 3 ------------------------------

## 2010-03-25 NOTE — Progress Notes (Signed)
Summary: [facility] REPORT  Phone Note Call from Patient Call back at [phone]   Summary of Call: Pt is req status of a report stating that she has stopped smoking and what she has done to work  towards weight loss. She would like info faxed to [phone].  Initial call taken by: Lamar Sprinkles, CMA,  Jul 15, 2009 3:27 PM  Follow-up for Phone Call        she needs to write down what she has done to try to lose weight and I will type it up as a letter Follow-up by: Etta Grandchild MD,  Jul 15, 2009 3:37 PM

## 2010-03-25 NOTE — Assessment & Plan Note (Signed)
Summary: 1 MTH FU  STC   Vital Signs:  Patient profile:   45 year old female Height:      63 inches Weight:      261 pounds BMI:     46.40 O2 Sat:      98 % on Room air Temp:     98.5 degrees F oral Pulse rate:   92 / minute BP sitting:   132 / 90  (left arm) Cuff size:   large  Vitals Entered ByMarland Kitchen Zella Ball Ewing (February 25, 2009 1:37 PM)  O2 Flow:  Room air CC: 1 mo followup/RE   CC:  1 mo followup/RE.  History of Present Illness: here as she has not been arranged to see the dietary clinic yet;  not sure why ;  she did try to call them at noon time and and told to call back at  2 pm so she became angry that they were out at lunch;  frustrated and "now I'm a month behind" and was trying to get this done so that she could not get the surgury done in april;  also concerned about a ? mammogram and needs f/u films but does not need me to arrange this;  declines taking the statin and decided not to take as she wants to wait until after surgury at least to repeat the cholesterol ;  wants to try a different dietary clinic if available;  Pt denies CP, sob, doe, wheezing, orthopnea, pnd, worsening LE edema, palps, dizziness or syncope   Taking the wellbutrin and losartan , tolerating well.  No worsening depressive symtpoms or suicidal ideation.  Has some problem with the left eye with blurriness but no pain - has appt with optho later today.  did start the atkins diet herself yesterday to get started on some type of diet.  Still smoking - generic wellbutrin not working for this.  also wants to consider diet pill as she has gained further wt since lsat visit.    Problems Prior to Update: 1)  Need Prophylactic Vaccination&inoculation Flu  (ICD-V04.81) 2)  Fatigue  (ICD-780.79) 3)  Back Pain, Chronic  (ICD-724.5) 4)  Morbid Obesity  (ICD-278.01) 5)  Disc Disease, Lumbar  (ICD-722.52) 6)  Smoker  (ICD-305.1) 7)  Hypertension  (ICD-401.9) 8)  Hyperlipidemia  (ICD-272.4) 9)  Allergic Rhinitis   (ICD-477.9) 10)  Manic Depressive Illness  (ICD-296.80) 11)  Anemia-iron Deficiency  (ICD-280.9)  Medications Prior to Update: 1)  Gabapentin 400 Mg Caps (Gabapentin) .Marland Kitchen.. 1 By Mouth Once Daily 2)  Clonazepam 0.5 Mg Tabs (Clonazepam) .... Three Times A Day As Needed 3)  Lexapro 10 Mg Tabs (Escitalopram Oxalate) .Marland Kitchen.. 1 By Mouth Every Morning 4)  Bupropion Hcl 150 Mg Xr24h-Tab (Bupropion Hcl) .Marland Kitchen.. 1` By Mouth Once Daily 5)  Hydrocodone-Acetaminophen 5-325 Mg Tabs (Hydrocodone-Acetaminophen) .Marland Kitchen.. 1 By Mouth Three Times A Day As Needed 6)  Losartan Potassium 100 Mg Tabs (Losartan Potassium) .Marland Kitchen.. 1 By Mouth Once Daily 7)  Seroquel 100 Mg Tabs (Quetiapine Fumarate) .Marland Kitchen.. 1 By Mouth At Bedtime 8)  Simvastatin 40 Mg Tabs (Simvastatin) .Marland Kitchen.. 1 By Mouth Once Daily  Current Medications (verified): 1)  Gabapentin 400 Mg Caps (Gabapentin) .Marland Kitchen.. 1 By Mouth Once Daily 2)  Clonazepam 0.5 Mg Tabs (Clonazepam) .... Three Times A Day As Needed 3)  Lexapro 10 Mg Tabs (Escitalopram Oxalate) .Marland Kitchen.. 1 By Mouth Every Morning 4)  Hydrocodone-Acetaminophen 5-325 Mg Tabs (Hydrocodone-Acetaminophen) .Marland Kitchen.. 1 By Mouth Three Times A Day As Needed 5)  Losartan Potassium 100 Mg Tabs (Losartan Potassium) .Marland Kitchen.. 1 By Mouth Once Daily 6)  Seroquel 100 Mg Tabs (Quetiapine Fumarate) .Marland Kitchen.. 1 By Mouth At Bedtime 7)  Phentermine Hcl 37.5 Mg Caps (Phentermine Hcl) .Marland Kitchen.. 1po Once Daily  Allergies (verified): No Known Drug Allergies  Past History:  Past Medical History: Last updated: 01/25/2009 Anemia-iron deficiency Bipolar Illness Allergic rhinitis Hyperlipidemia Hypertension HIV neg 2001 Lumbar disc disease chronic pain syndrome chronic smoker morbid obesity  Past Surgical History: Last updated: 01/25/2009 s/p bladder surgury with ? diverticulitis Hysterectomy Tubal ligation s/p right hand tendon surgury- index and middle  Social History: Last updated: 01/25/2009 moved from [place], then [place] to [place]  2009 Divorced 2 chidren disabled - bipolar Current Smoker Alcohol use-no no illiict drugs  Risk Factors: Smoking Status: current (01/25/2009)  Review of Systems       all otherwise negative per pt -   Physical Exam  General:  alert and overweight-appearing.  alert and overweight-appearing.   Head:  normocephalic and atraumatic.  normocephalic and atraumatic.   Eyes:  vision grossly intact, pupils equal, and pupils round.  vision grossly intact, pupils equal, and pupils round.   Ears:  R ear normal and L ear normal.  R ear normal and L ear normal.   Nose:  no external deformity and no nasal discharge.  no external deformity and no nasal discharge.   Mouth:  no gingival abnormalities and pharynx pink and moist.  no gingival abnormalities and pharynx pink and moist.   Neck:  supple and no masses.  supple and no masses.   Lungs:  normal respiratory effort and normal breath sounds.  normal respiratory effort and normal breath sounds.   Heart:  normal rate and regular rhythm.  normal rate and regular rhythm.   Abdomen:  soft, non-tender, and normal bowel sounds.  soft, non-tender, and normal bowel sounds.   Msk:  no joint tenderness and no joint swelling.  no joint tenderness and no joint swelling.   Extremities:  no edema, no erythema    Impression & Recommendations:  Problem # 1:  HYPERTENSION (ICD-401.9)  Her updated medication list for this problem includes:    Losartan Potassium 100 Mg Tabs (Losartan potassium) .Marland Kitchen... 1 by mouth once daily  BP today: 132/90 Prior BP: 152/100 (01/25/2009)  Labs Reviewed: K+: 3.7 (01/25/2009) Creat: : 0.7 (01/25/2009)   Chol: 232 (01/25/2009)   HDL: 32.80 (01/25/2009)   TG: 94.0 (01/25/2009) stable overall by hx and exam, ok to continue meds/tx as is   Problem # 2:  HYPERLIPIDEMIA (ICD-272.4)  The following medications were removed from the medication list:    Simvastatin 40 Mg Tabs (Simvastatin) .Marland Kitchen... 1 by mouth once daily wants to hold  off on tx for now until after improved diet and bariatric surgury hopefully later this year  Problem # 3:  SMOKER (ICD-305.1) to stop the wellbutrin , although I think it may have helped with depressive symtpoms  Problem # 4:  MORBID OBESITY (ICD-278.01) ok to try short term - phenetermine asd   Complete Medication List: 1)  Gabapentin 400 Mg Caps (Gabapentin) .Marland Kitchen.. 1 by mouth once daily 2)  Clonazepam 0.5 Mg Tabs (Clonazepam) .... Three times a day as needed 3)  Lexapro 10 Mg Tabs (Escitalopram oxalate) .Marland Kitchen.. 1 by mouth every morning 4)  Hydrocodone-acetaminophen 5-325 Mg Tabs (Hydrocodone-acetaminophen) .Marland Kitchen.. 1 by mouth three times a day as needed 5)  Losartan Potassium 100 Mg Tabs (Losartan potassium) .Marland Kitchen.. 1 by mouth once  daily 6)  Seroquel 100 Mg Tabs (Quetiapine fumarate) .Marland Kitchen.. 1 by mouth at bedtime 7)  Phentermine Hcl 37.5 Mg Caps (Phentermine hcl) .Marland Kitchen.. 1po once daily  Patient Instructions: 1)  stop the wellbutrin 2)  stop the simvastatin 3)  Please take all new medications as prescribed  4)  Continue all previous medications as before this visit  5)  You will be contacted about the referral(s) to: dietary clinic -  Dagoberto Reef here at the office states the clinic has been contacted and should call you; she will try again;  if not successful this week, pelase let us know  6)  Please schedule a follow-up appointment as recommended at your last office visit Prescriptions: PHENTERMINE HCL 37.5 MG CAPS (PHENTERMINE HCL) 1po once daily  #30 x 2   Entered and Authorized by:   Corwin Levins MD   Signed by:   Corwin Levins MD on 02/25/2009   Method used:   Print then Give to Patient   RxID:   [id number]

## 2010-03-25 NOTE — Progress Notes (Signed)
Summary: REFERRAL  Phone Note Call from Patient   Summary of Call: Pt is unhappy w/her current surgeon and would like referral for second opinion.  Initial call taken by: Lamar Sprinkles, CMA,  January 21, 2010 10:12 AM

## 2010-03-25 NOTE — Progress Notes (Signed)
Summary: Med Refill  Phone Note Refill Request  on Jul 16, 2009 10:47 AM  Refills Requested: Medication #1:  HYDROCODONE-ACETAMINOPHEN 5-325 MG TABS 1 by mouth three times a day as needed   Dosage confirmed as above?Dosage Confirmed   Notes: [facility] [address] [phone] Initial call taken by: Scharlene Gloss,  Jul 16, 2009 10:47 AM  Follow-up for Phone Call        ok to defer to Dr Yetta Barre, who I think the patient is seeing now as primary md Follow-up by: Corwin Levins MD,  Jul 16, 2009 2:03 PM  Additional Follow-up for Phone Call Additional follow up Details #1::        i woul like for her to stop taking this Additional Follow-up by: Etta Grandchild MD,  Jul 17, 2009 7:42 AM    Additional Follow-up for Phone Call Additional follow up Details #2::    medication denied, pharmacy notified via fax.Marland KitchenMarland KitchenAlvy Beal Archie CMA  Jul 18, 2009 8:27 AM

## 2010-03-25 NOTE — Assessment & Plan Note (Signed)
Summary: NAUSEA --  DIZZINESS--PER D/T--ER/URG CARE IF WORSEN BP: 189/...   Vital Signs:  Patient profile:   45 year old female Height:      63 inches Weight:      256 pounds BMI:     45.51 O2 Sat:      99 % on Room air Temp:     98.2 degrees F oral Pulse rate:   102 / minute BP sitting:   118 / 86  (left arm) Cuff size:   large  Vitals Entered ByZella Ball Ewing (March 29, 2009 2:27 PM)  O2 Flow:  Room air CC: nausea,dizzy,headache/RE   CC:  nausea, dizzy, and headache/RE.  History of Present Illness: here with acute onset 3 days fever , headache, ST and prod cough with greenish sputum;  Pt denies CP, sob, doe, wheezing, orthopnea, pnd, worsening LE edema, palps,  syncope .  Has significnat dizziness and room spinning with nausea as well, no vomiting, abd pain , diarrhea or chills.    Has lost 8 lbs intentionally with better diet , meds and trying to be more active. States today the long form I received before does not need to be filled out.   Pt denies new neuro symptoms such as headache, facial or extremity weakness Good compliance with meds, tolerating well.    Problems Prior to Update: 1)  Nausea  (ICD-787.02) 2)  Vertigo  (ICD-780.4) 3)  Bronchitis-acute  (ICD-466.0) 4)  Need Prophylactic Vaccination&inoculation Flu  (ICD-V04.81) 5)  Fatigue  (ICD-780.79) 6)  Back Pain, Chronic  (ICD-724.5) 7)  Morbid Obesity  (ICD-278.01) 8)  Disc Disease, Lumbar  (ICD-722.52) 9)  Smoker  (ICD-305.1) 10)  Hypertension  (ICD-401.9) 11)  Hyperlipidemia  (ICD-272.4) 12)  Allergic Rhinitis  (ICD-477.9) 13)  Manic Depressive Illness  (ICD-296.80) 14)  Anemia-iron Deficiency  (ICD-280.9)  Medications Prior to Update: 1)  Gabapentin 400 Mg Caps (Gabapentin) .Marland Kitchen.. 1 By Mouth Once Daily 2)  Clonazepam 0.5 Mg Tabs (Clonazepam) .... Three Times A Day As Needed 3)  Lexapro 10 Mg Tabs (Escitalopram Oxalate) .Marland Kitchen.. 1 By Mouth Every Morning 4)  Hydrocodone-Acetaminophen 5-325 Mg Tabs  (Hydrocodone-Acetaminophen) .Marland Kitchen.. 1 By Mouth Three Times A Day As Needed 5)  Losartan Potassium 100 Mg Tabs (Losartan Potassium) .Marland Kitchen.. 1 By Mouth Once Daily 6)  Seroquel 100 Mg Tabs (Quetiapine Fumarate) .Marland Kitchen.. 1 By Mouth At Bedtime 7)  Phentermine Hcl 37.5 Mg Caps (Phentermine Hcl) .Marland Kitchen.. 1po Once Daily  Current Medications (verified): 1)  Gabapentin 400 Mg Caps (Gabapentin) .Marland Kitchen.. 1 By Mouth Once Daily 2)  Clonazepam 0.5 Mg Tabs (Clonazepam) .... Three Times A Day As Needed 3)  Lexapro 10 Mg Tabs (Escitalopram Oxalate) .Marland Kitchen.. 1 By Mouth Every Morning 4)  Hydrocodone-Acetaminophen 5-325 Mg Tabs (Hydrocodone-Acetaminophen) .Marland Kitchen.. 1 By Mouth Three Times A Day As Needed 5)  Losartan Potassium 100 Mg Tabs (Losartan Potassium) .Marland Kitchen.. 1 By Mouth Once Daily 6)  Seroquel 100 Mg Tabs (Quetiapine Fumarate) .Marland Kitchen.. 1 By Mouth At Bedtime 7)  Phentermine Hcl 37.5 Mg Caps (Phentermine Hcl) .Marland Kitchen.. 1po Once Daily 8)  Cephalexin 500 Mg Caps (Cephalexin) .Marland Kitchen.. 1 By Mouth Three Times A Day 9)  Meclizine Hcl 12.5 Mg Tabs (Meclizine Hcl) .Marland Kitchen.. 1 - 2 By Mouth Q 6 Hrs As Needed 10)  Promethazine Hcl 25 Mg Tabs (Promethazine Hcl) .Marland Kitchen.. 1 By Mouth Q 6 Hrs As Needed Nausea  Allergies (verified): No Known Drug Allergies  Past History:  Past Medical History: Last updated: 01/25/2009 Anemia-iron deficiency Bipolar  Illness Allergic rhinitis Hyperlipidemia Hypertension HIV neg 2001 Lumbar disc disease chronic pain syndrome chronic smoker morbid obesity  Past Surgical History: Last updated: 01/25/2009 s/p bladder surgury with ? diverticulitis Hysterectomy Tubal ligation s/p right hand tendon surgury- index and middle  Social History: Last updated: 01/25/2009 moved from [place], then [place] to [place] 2009 Divorced 2 chidren disabled - bipolar Current Smoker Alcohol use-no no illiict drugs  Risk Factors: Smoking Status: current (01/25/2009)  Review of Systems       all otherwise negative per pt -  Physical  Exam  General:  alert and overweight-appearing.   Head:  normocephalic and atraumatic.   Eyes:  vision grossly intact, pupils equal, and pupils round.   Ears:  bilat tm's red, sinus nontender Nose:  nasal dischargemucosal pallor and mucosal erythema.   Mouth:  pharyngeal erythema and fair dentition.   Neck:  supple and no masses.   Lungs:  normal respiratory effort and normal breath sounds.   Heart:  normal rate and regular rhythm.   Extremities:  no edema, no erythema  Neurologic:  cranial nerves II-XII intact, strength normal in all extremities, and finger-to-nose normal.     Impression & Recommendations:  Problem # 1:  BRONCHITIS-ACUTE (ICD-466.0)  Her updated medication list for this problem includes:    Cephalexin 500 Mg Caps (Cephalexin) .Marland Kitchen... 1 by mouth three times a day treat as above, f/u any worsening signs or symptoms   Problem # 2:  VERTIGO (ICD-780.4)  Her updated medication list for this problem includes:    Meclizine Hcl 12.5 Mg Tabs (Meclizine hcl) .Marland Kitchen... 1 - 2 by mouth q 6 hrs as needed    Promethazine Hcl 25 Mg Tabs (Promethazine hcl) .Marland Kitchen... 1 by mouth q 6 hrs as needed nausea treat as above, f/u any worsening signs or symptoms   Problem # 3:  NAUSEA (ICD-787.02)  Her updated medication list for this problem includes:    Meclizine Hcl 12.5 Mg Tabs (Meclizine hcl) .Marland Kitchen... 1 - 2 by mouth q 6 hrs as needed treat as above, f/u any worsening signs or symptoms , gave phenergan as needed   Problem # 4:  MORBID OBESITY (ICD-278.01) lost another 8 lbs - cont wt loss efforts, diet, excercise, meds, f/u 1 mo  Complete Medication List: 1)  Gabapentin 400 Mg Caps (Gabapentin) .Marland Kitchen.. 1 by mouth once daily 2)  Clonazepam 0.5 Mg Tabs (Clonazepam) .... Three times a day as needed 3)  Lexapro 10 Mg Tabs (Escitalopram oxalate) .Marland Kitchen.. 1 by mouth every morning 4)  Hydrocodone-acetaminophen 5-325 Mg Tabs (Hydrocodone-acetaminophen) .Marland Kitchen.. 1 by mouth three times a day as needed 5)   Losartan Potassium 100 Mg Tabs (Losartan potassium) .Marland Kitchen.. 1 by mouth once daily 6)  Seroquel 100 Mg Tabs (Quetiapine fumarate) .Marland Kitchen.. 1 by mouth at bedtime 7)  Phentermine Hcl 37.5 Mg Caps (Phentermine hcl) .Marland Kitchen.. 1po once daily 8)  Cephalexin 500 Mg Caps (Cephalexin) .Marland Kitchen.. 1 by mouth three times a day 9)  Meclizine Hcl 12.5 Mg Tabs (Meclizine hcl) .Marland Kitchen.. 1 - 2 by mouth q 6 hrs as needed 10)  Promethazine Hcl 25 Mg Tabs (Promethazine hcl) .Marland Kitchen.. 1 by mouth q 6 hrs as needed nausea  Patient Instructions: 1)  Please take all new medications as prescribed 2)  Continue all previous medications as before this visit  3)  Please schedule a follow-up appointment in 1 month. Prescriptions: PROMETHAZINE HCL 25 MG TABS (PROMETHAZINE HCL) 1 by mouth q 6 hrs as needed nausea  #  40 x 1   Entered and Authorized by:   Corwin Levins MD   Signed by:   Corwin Levins MD on 03/29/2009   Method used:   Print then Give to Patient   RxID:   [id number] MECLIZINE HCL 12.5 MG TABS (MECLIZINE HCL) 1 - 2 by mouth q 6 hrs as needed  #60 x 0   Entered and Authorized by:   Corwin Levins MD   Signed by:   Corwin Levins MD on 03/29/2009   Method used:   Print then Give to Patient   RxID:   [id number] CEPHALEXIN 500 MG CAPS (CEPHALEXIN) 1 by mouth three times a day  #30 x 0   Entered and Authorized by:   Corwin Levins MD   Signed by:   Corwin Levins MD on 03/29/2009   Method used:   Print then Give to Patient   RxID:   [id number]

## 2010-03-25 NOTE — Progress Notes (Signed)
Summary: REFERRAL NEEDED  Phone Note Call from Patient Call back at Home Phone [phone]   Summary of Call: Patient is requesting a referral Initial call taken by: Lamar Sprinkles, CMA,  October 03, 2009 3:33 PM  Follow-up for Phone Call        Spoke w/pt, She wants a referral for a Breath Test required prior to lab band. Left mess for bariatric nurse to call me back.  Patient emailed info from Dr Liz Claiborne office. She needs referral for Breath-tek/H. pylori test. This is only done per surgeon at [facility] long endo. [facility] may also do this but they may need GI consult first.   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

## 2010-03-25 NOTE — Letter (Signed)
Summary: New Patient Letter  [facility]  [address]   [place], [place] [address]   Phone:   Fax:        11/06/2009   Sanda Linger [address] [place], [place]  [address]  RE: Maymie N.Blackburn DOB: [birth year]   To Whom It May Concern:  The above named patient has been seen by our office for one year. She suffers from the following co morbidities: Hypertention, and joint disease. Her current weight is: 275 lbs, height: 5"3, and BMI: 42. The patient has undergone the following weight loss attempts: Weight Watchers, Nutri-Systems, Overeaters Anonymous, Slim Fast. Patient has completed six months of diet monitoring in my office unsuccessfully.   I feel this patient would benefit from weight loss surgery because she has limited mobility due to a herniated disc in her lower back, that will require surgery if the weight is not controlled.  I appreciate your consideration. Please contact me for further questions.     Sincerely,     Sanda Linger, MD.

## 2010-03-25 NOTE — Letter (Signed)
Summary: NO show x2/[facility]  NO show x2/[facility]   Imported By: Lester Howard 03/04/2009 08:18:30  _____________________________________________________________________  External Attachment:    Type:   Image     Comment:   External Document

## 2010-03-25 NOTE — Letter (Signed)
Summary: New Patient letter  [facility]  [address] [place], [place] [address]   Phone: [phone]  Fax: [phone]       10/04/2009 MRN: [id number]  Shannon [name] [address] [place], [place]  [address]  Dear [name],  Welcome to the Gastroenterology Division at [facility].    You are scheduled to see Dr.  Jarold Motto on 11-19-09 at 9:30am on the 3rd floor at [facility], [address]  We ask that you try to arrive at our office 15 minutes prior to your appointment time to allow for check-in.  We would like you to complete the enclosed self-administered evaluation form prior to your visit and bring it with you on the day of your appointment.  We will review it with you.  Also, please bring a complete list of all your medications or, if you prefer, bring the medication bottles and we will list them.  Please bring your insurance card so that we may make a copy of it.  If your insurance requires a referral to see a specialist, please bring your referral form from your primary care physician.  Co-payments are due at the time of your visit and may be paid by cash, check or credit card.     Your office visit will consist of a consult with your physician (includes a physical exam), any laboratory testing he/she may order, scheduling of any necessary diagnostic testing (e.g. x-ray, ultrasound, CT-scan), and scheduling of a procedure (e.g. Endoscopy, Colonoscopy) if required.  Please allow enough time on your schedule to allow for any/all of these possibilities.    If you cannot keep your appointment, please call [phone] to cancel or reschedule prior to your appointment date.  This allows [name] the opportunity to schedule an appointment for another patient in need of care.  If you do not cancel or reschedule by 5 p.m. the business day prior to your appointment date, you will be charged a $50.00 late cancellation/no-show fee.    Thank you for  choosing  Gastroenterology for your medical needs.  We appreciate the opportunity to care for you.  Please visit [name] at our website  to learn more about our practice.                     Sincerely,                                                             The Gastroenterology Division

## 2010-03-25 NOTE — Assessment & Plan Note (Signed)
Summary: SURGERY CLEARANCE/LAP BAND--BREATH TEK REQ -FORM/DIET----STC   Vital Signs:  Patient profile:   45 year old female Height:      63 inches Weight:      273 pounds BMI:     48.53 O2 Sat:      97 % on Room air Temp:     98.4 degrees F oral Pulse rate:   88 / minute Pulse rhythm:   regular Resp:     16 per minute BP sitting:   124 / 88  (left arm) Cuff size:   large  Vitals Entered By: Rock Nephew CMA (September 16, 2009 4:10 PM)  Nutrition Counseling: Patient's BMI is greater than 25 and therefore counseled on weight management options.  O2 Flow:  Room air  Primary Care Provider:  Etta Grandchild MD   History of Present Illness: She returns for f/up and she states that she needs a procedure done to see if she is a candidate for Lap Band. She saw her surgeon, Dr. Ezzard Standing, recently and he recommended it. She does not know what the procedure is for but she calls it a " Bek-it ". We called Dr. Allene Pyo office today and the bariatric nurse was not in the office today so no more info was available. I don't know what a Bek-it procedure is or who does it so I am waiting for more info.  Preventive Screening-Counseling & Management  Alcohol-Tobacco     Alcohol drinks/day: 0     Smoking Status: quit < 6 months     Smoking Cessation Counseling: yes     Smoke Cessation Stage: quit     Tobacco Counseling: to remain off tobacco products  Caffeine-Diet-Exercise     Diet Counseling: to improve diet; diet is suboptimal     Does Patient Exercise: yes     Type of exercise: walking     Exercise (avg: min/session): <30     Times/week: 3     Exercise Counseling: to improve exercise regimen  Hep-HIV-STD-Contraception     Hepatitis Risk: no risk noted     HIV Risk: no risk noted     STD Risk: no risk noted      Sexual History:  currently monogamous.        Drug Use:  never.        Blood Transfusions:  no.    Medications Prior to Update: 1)  Gabapentin 400 Mg Caps (Gabapentin) .Marland Kitchen..  1 By Mouth Once Daily 2)  Clonazepam 0.5 Mg Tabs (Clonazepam) .... Three Times A Day As Needed 3)  Lexapro 10 Mg Tabs (Escitalopram Oxalate) .Marland Kitchen.. 1 By Mouth Every Morning 4)  Losartan Potassium 100 Mg Tabs (Losartan Potassium) .Marland Kitchen.. 1 By Mouth Once Daily 5)  Phentermine Hcl 37.5 Mg Caps (Phentermine Hcl) .Marland Kitchen.. 1po Once Daily  Current Medications (verified): 1)  Gabapentin 400 Mg Caps (Gabapentin) .Marland Kitchen.. 1 By Mouth Once Daily 2)  Clonazepam 0.5 Mg Tabs (Clonazepam) .... Three Times A Day As Needed  Allergies (verified): No Known Drug Allergies  Past History:  Past Medical History: Last updated: 02/03/09 Anemia-iron deficiency Bipolar Illness Allergic rhinitis Hyperlipidemia Hypertension HIV neg 2001 Lumbar disc disease chronic pain syndrome chronic smoker morbid obesity  Past Surgical History: Last updated: February 03, 2009 s/p bladder surgury with ? diverticulitis Hysterectomy Tubal ligation s/p right hand tendon surgury- index and middle  Family History: Last updated: February 03, 2009 father died iwth ETOH cirrhosis, stroke, HTN mother with HTN sister and p-aunt overwt  and HTN aunt with  DM uncle with DM maternal - several with emotional illness but not dx or tx son and 3 nephews with ADHD, and emotional issues  Social History: Last updated: 01/25/2009 moved from [place], then [place] to [place] 2009 Divorced 2 chidren disabled - bipolar Current Smoker Alcohol use-no no illiict drugs  Risk Factors: Alcohol Use: 0 (09/16/2009) Exercise: yes (09/16/2009)  Risk Factors: Smoking Status: quit < 6 months (09/16/2009)  Family History: Reviewed history from 01/25/2009 and no changes required. father died iwth ETOH cirrhosis, stroke, HTN mother with HTN sister and p-aunt overwt  and HTN aunt with DM uncle with DM maternal - several with emotional illness but not dx or tx son and 3 nephews with ADHD, and emotional issues  Social History: Reviewed history from  01/25/2009 and no changes required. moved from [place], then [place] to [place] 2009 Divorced 2 chidren disabled - bipolar Current Smoker Alcohol use-no no illiict drugsDoes Patient Exercise:  yes  Review of Systems       The patient complains of weight gain.  The patient denies anorexia, fever, weight loss, chest pain, syncope, dyspnea on exertion, peripheral edema, prolonged cough, headaches, hemoptysis, abdominal pain, muscle weakness, suspicious skin lesions, difficulty walking, and depression.    Physical Exam  General:  alert, well-developed, well-nourished, well-hydrated, and overweight-appearing.   Head:  normocephalic, atraumatic, no abnormalities observed, and no abnormalities palpated.   Mouth:  good dentition, pharynx pink and moist, no erythema, and no posterior lymphoid hypertrophy.   Neck:  No deformities, masses, or tenderness noted. Lungs:  Normal respiratory effort, chest expands symmetrically. Lungs are clear to auscultation, no crackles or wheezes. Heart:  Normal rate and regular rhythm. S1 and S2 normal without gallop, murmur, click, rub or other extra sounds. Abdomen:  Bowel sounds positive,abdomen soft and non-tender without masses, organomegaly or hernias noted. Msk:  No deformity or scoliosis noted of thoracic or lumbar spine.   Pulses:  R and L carotid,radial,femoral,dorsalis pedis and posterior tibial pulses are full and equal bilaterally Extremities:  No clubbing, cyanosis, edema, or deformity noted with normal full range of motion of all joints.   Neurologic:  alert & oriented X3, cranial nerves II-XII intact, strength normal in all extremities, sensation intact to light touch, gait normal, DTRs symmetrical and normal, finger-to-nose normal, heel-to-shin normal, RUE hyporeflexia, RLE hyporeflexia, LUE hyporeflexia, and LLE hyporeflexia.   Skin:  turgor normal, color normal, no rashes, no suspicious lesions, no ecchymoses, no petechiae, no purpura, no  ulcerations, and no edema.   Cervical Nodes:  no anterior cervical adenopathy and no posterior cervical adenopathy.   Psych:  Cognition and judgment appear intact. Alert and cooperative with normal attention span and concentration. No apparent delusions, illusions, hallucinations   Impression & Recommendations:  Problem # 1:  MORBID OBESITY (ICD-278.01) Assessment Deteriorated  Ht: 63 (09/16/2009)   Wt: 273 (09/16/2009)   BMI: 48.53 (09/16/2009)  Problem # 2:  HYPERTENSION (ICD-401.9) Assessment: Unchanged  The following medications were removed from the medication list:    Losartan Potassium 100 Mg Tabs (Losartan potassium) .Marland Kitchen... 1 by mouth once daily  BP today: 124/88 Prior BP: 118/80 (07/26/2009)  Prior 10 Yr Risk Heart Disease: Not enough information (05/31/2009)  Labs Reviewed: K+: 3.7 (01/25/2009) Creat: : 0.7 (01/25/2009)   Chol: 232 (01/25/2009)   HDL: 32.80 (01/25/2009)   TG: 94.0 (01/25/2009)  Complete Medication List: 1)  Gabapentin 400 Mg Caps (Gabapentin) .Marland Kitchen.. 1 by mouth once daily 2)  Clonazepam 0.5 Mg Tabs (Clonazepam) .Marland KitchenMarland KitchenMarland Kitchen  Three times a day as needed  Patient Instructions: 1)  Please schedule a follow-up appointment in 1 month. 2)  It is important that you exercise regularly at least 20 minutes 5 times a week. If you develop chest pain, have severe difficulty breathing, or feel very tired , stop exercising immediately and seek medical attention. 3)  You need to lose weight. Consider a lower calorie diet and regular exercise.  4)  Check your Blood Pressure regularly. If it is above 140/90: you should make an appointment.

## 2010-03-25 NOTE — Progress Notes (Signed)
Summary: Diagnosis needed  Phone Note Outgoing Call   Summary of Call: DX is need for lab to draw MMR titers. please advise. Initial call taken by: Lucious Groves,  March 01, 2009 2:27 PM  Follow-up for Phone Call        v03.89  also, if you would, please inform pt that I am in receipt of the paperwork for the "medically supervised wt loss documentation" of which I simply do not wish to complete, due to the problem that it amounts to paperwork to be done at the expense of my free time that I am forced to do by the  insurance company and the surgeons  if she wishes, she can transfer her care to another MD as I do not wish to be responsible for this type of care Follow-up by: Corwin Levins MD,  March 01, 2009 2:41 PM  Additional Follow-up for Phone Call Additional follow up Details #1::        lab notified. Patient notified of the above and she is not sure why they want a month to month eval. Per the patient her purpose in coming to this office was prior to gastric bypass. Patient notified me that she was referred to a nutrionist from our office. Patient became very upset and would not let me explain the above in more detail so I offered office visit to discuss, patient agreed and was transferred to scheduling. Additional Follow-up by: Lucious Groves,  March 01, 2009 3:00 PM

## 2010-03-25 NOTE — Progress Notes (Signed)
Summary: Weight loss Report  Weight loss Report   Imported By: Lester  11/08/2009 08:27:37  _____________________________________________________________________  External Attachment:    Type:   Image     Comment:   External Document

## 2010-03-25 NOTE — Progress Notes (Signed)
Phone Note Call from Patient Call back at [phone]   Caller: Patient Call For: Dr Jonny Ruiz Summary of Call: Pt states she was in this past friday and rec'd prescriptions and  never filled as she  lost them.Pt is asking if she can can a new prescription faxed to [facility] Please call pt adn let her know. Initial call taken by: Verdell Face,  April 02, 2009 12:01 PM  Follow-up for Phone Call        okay to fill, I will call pt and send to pharmacy if okay with her Follow-up by: Margaret Pyle, CMA,  April 02, 2009 12:44 PM  Additional Follow-up for Phone Call Additional follow up Details #1::        ok to fill again only the ones from last visit Additional Follow-up by: Corwin Levins MD,  April 02, 2009 12:48 PM    Additional Follow-up for Phone Call Additional follow up Details #2::    left message on machine for pt to return my call. Margaret Pyle, CMA  April 02, 2009 2:33 PM   Additional Follow-up for Phone Call Additional follow up Details #3:: Details for Additional Follow-up Action Taken: pt informed that Rx were sent to her pharmacy [facility] [address] Additional Follow-up by: Margaret Pyle, CMA,  April 02, 2009 2:43 PM  Prescriptions: PROMETHAZINE HCL 25 MG TABS (PROMETHAZINE HCL) 1 by mouth q 6 hrs as needed nausea  #40 x 1   Entered by:   Margaret Pyle, CMA   Authorized by:   Corwin Levins MD   Signed by:   Margaret Pyle, CMA on 04/02/2009   Method used:   Electronically to        [facility]  [address] [id number] (retail)       [address]       [place], [place]  [address]       Ph: [phone] or [phone]       Fax: [phone]   RxID:   [id number] MECLIZINE HCL 12.5 MG TABS (MECLIZINE HCL) 1 - 2 by mouth q 6 hrs as needed  #60 x 0   Entered by:   Margaret Pyle, CMA   Authorized by:   Corwin Levins MD   Signed by:   Margaret Pyle, CMA on 04/02/2009   Method  used:   Electronically to        [facility]  [address] [id number] ([facility]       [address]       [place], [place]  [address]       Ph: [phone] or [phone]       Fax: [phone]   RxID:   [id number] CEPHALEXIN 500 MG CAPS (CEPHALEXIN) 1 by mouth three times a day  #30 x 0   Entered by:   Margaret Pyle, CMA   Authorized by:   Corwin Levins MD   Signed by:   Margaret Pyle, CMA on 04/02/2009   Method used:   Electronically to        [facility]  [address] [id number] ([facility]       [address]       [place], [place]  [address]       Ph: [phone] or [phone]       Fax: [phone]   RxID:   [id number]

## 2010-03-25 NOTE — Assessment & Plan Note (Signed)
Summary: MMR,TETANUS/FLU/#/CD  Nurse Visit   Allergies: No Known Drug Allergies  Immunizations Administered:  Tetanus Vaccine:    Vaccine Type: Tdap    Site: left deltoid    Mfr: Sanofi Pasteur    Dose: 0.5 ml    Route: IM    Given by: Margaret Pyle, CMA    Exp. Date: 09/08/2010    Lot #: [id number]  Influenza Vaccine # 1:    Vaccine Type: Fluvax 3+    Site: right deltoid    Mfr: GlaxoSmithKline    Dose: 0.5 ml    Route: IM    Given by: Margaret Pyle, CMA    Exp. Date: 08/22/2009    Lot #: [id number]  Flu Vaccine Consent Questions:    Do you have a history of severe allergic reactions to this vaccine? no    Any prior history of allergic reactions to egg and/or gelatin? no    Do you have a sensitivity to the preservative Thimersol? no    Do you have a past history of Guillan-Barre Syndrome? no    Do you currently have an acute febrile illness? no    Have you ever had a severe reaction to latex? no    Vaccine information given and explained to patient? yes    Are you currently pregnant? no  Orders Added: 1)  Tdap => 56yrs IM [90715] 2)  Flu Vaccine 46yrs + [90658] 3)  Admin 1st Vaccine [90471] 4)  Admin of Any Addtl Vaccine [90472] 5)  Est. Patient Level I [47829]

## 2010-03-25 NOTE — Miscellaneous (Signed)
Summary: Pos Urea breath test  Per Mike Gip, PA treat patient with Pylera and omeprazole and if her insurance does not cover treat with flagyl and bixan. The hard copy will be given to Dr. Jarold Motto when he returns to the office and I will send Dr. Daphine Deutscher a copy and Dr. Yetta Barre. Patient is aware of teh result.   Clinical Lists Changes  Medications: Added new medication of PYLERA 140-125-125 MG CAPS (BIS SUBCIT-METRONID-TETRACYC) take three caps by mouth four times a day X 10days - Signed Added new medication of OMEPRAZOLE 20 MG CPDR (OMEPRAZOLE) take one by mouth two times a day - Signed Rx of PYLERA 140-125-125 MG CAPS (BIS SUBCIT-METRONID-TETRACYC) take three caps by mouth four times a day X 10days;  #1 pak x 0;  Signed;  Entered by: Harlow Mares CMA (AAMA);  Authorized by: Sammuel Cooper PA-c;  Method used: Electronically to [facility]  [address] [id number] [address], [place], [place]  [address], Ph: [phone] or [phone], Fax: [phone] Rx of OMEPRAZOLE 20 MG CPDR (OMEPRAZOLE) take one by mouth two times a day;  #20 x 0;  Signed;  Entered by: Harlow Mares CMA (AAMA);  Authorized by: Sammuel Cooper PA-c;  Method used: Electronically to [facility]  [address] [id number] [address] [place], [place], [place]  [address], Ph: [phone] or [phone], Fax: [phone]    Prescriptions: OMEPRAZOLE 20 MG CPDR (OMEPRAZOLE) take one by mouth two times a day  #20 x 0   Entered by:   Harlow Mares CMA (AAMA)   Authorized by:   Sammuel Cooper PA-c   Signed by:   Harlow Mares CMA (AAMA) on 11/22/2009   Method used:   [facility]ically to        [id number] (retail)       [address]       [place], [place]  [address]       Ph: [phone] or [phone]       Fax: [phone]   RxID:   [id number] PYLERA 140-125-125 MG CAPS (BIS SUBCIT-METRONID-TETRACYC) take three caps by mouth four times a day X 10days  #1 pak x 0   Entered by:    Harlow Mares CMA (AAMA)   Authorized by:   Sammuel Cooper PA-c   Signed by:   Harlow Mares CMA (AAMA) on 11/22/2009   Method used:   [facility] to        [facility]  [address] [id number] ([facility]       [address]       [place], [place]  [address]       Ph: [phone] or [phone]       Fax: [phone]   RxID:   [id number]

## 2010-03-25 NOTE — Assessment & Plan Note (Signed)
Summary: needs medication per pt/#cd   Vital Signs:  Patient profile:   45 year old female Height:      63 inches Weight:      256.25 pounds BMI:     45.56 O2 Sat:      96 % on Room air Temp:     98.6 degrees F oral Pulse rate:   116 / minute BP sitting:   120 / 80  (left arm) Cuff size:   large  Vitals Entered ByZella Ball Ewing (April 30, 2009 2:22 PM)  O2 Flow:  Room air CC: Discuss medication/RE   CC:  Discuss medication/RE.  History of Present Illness: needs refill on the seroquel which has worked well for the insomnia;  due to start 2 wk liquid diet at the 6 mo mark (may 2011) in prep for lap band; still very much engaged per pt with efforts at diet control with lower chol/low calorie diet, increased excercise activity daily with walks and gym, Pt denies CP, sob, doe, wheezing, orthopnea, pnd, worsening LE edema, palps, dizziness or syncope   Pt denies new neuro symptoms such as headache, facial or extremity weakness    Wt down several lbs since last visit.    Problems Prior to Update: 1)  Insomnia-sleep Disorder-unspec  (ICD-780.52) 2)  Nausea  (ICD-787.02) 3)  Vertigo  (ICD-780.4) 4)  Need Prophylactic Vaccination&inoculation Flu  (ICD-V04.81) 5)  Fatigue  (ICD-780.79) 6)  Back Pain, Chronic  (ICD-724.5) 7)  Morbid Obesity  (ICD-278.01) 8)  Disc Disease, Lumbar  (ICD-722.52) 9)  Smoker  (ICD-305.1) 10)  Hypertension  (ICD-401.9) 11)  Hyperlipidemia  (ICD-272.4) 12)  Allergic Rhinitis  (ICD-477.9) 13)  Manic Depressive Illness  (ICD-296.80) 14)  Anemia-iron Deficiency  (ICD-280.9)  Medications Prior to Update: 1)  Gabapentin 400 Mg Caps (Gabapentin) .Marland Kitchen.. 1 By Mouth Once Daily 2)  Clonazepam 0.5 Mg Tabs (Clonazepam) .... Three Times A Day As Needed 3)  Lexapro 10 Mg Tabs (Escitalopram Oxalate) .Marland Kitchen.. 1 By Mouth Every Morning 4)  Hydrocodone-Acetaminophen 5-325 Mg Tabs (Hydrocodone-Acetaminophen) .Marland Kitchen.. 1 By Mouth Three Times A Day As Needed 5)  Losartan Potassium 100 Mg  Tabs (Losartan Potassium) .Marland Kitchen.. 1 By Mouth Once Daily 6)  Seroquel 100 Mg Tabs (Quetiapine Fumarate) .Marland Kitchen.. 1 By Mouth At Bedtime 7)  Phentermine Hcl 37.5 Mg Caps (Phentermine Hcl) .Marland Kitchen.. 1po Once Daily 8)  Cephalexin 500 Mg Caps (Cephalexin) .Marland Kitchen.. 1 By Mouth Three Times A Day 9)  Meclizine Hcl 12.5 Mg Tabs (Meclizine Hcl) .Marland Kitchen.. 1 - 2 By Mouth Q 6 Hrs As Needed 10)  Promethazine Hcl 25 Mg Tabs (Promethazine Hcl) .Marland Kitchen.. 1 By Mouth Q 6 Hrs As Needed Nausea  Current Medications (verified): 1)  Gabapentin 400 Mg Caps (Gabapentin) .Marland Kitchen.. 1 By Mouth Once Daily 2)  Clonazepam 0.5 Mg Tabs (Clonazepam) .... Three Times A Day As Needed 3)  Lexapro 10 Mg Tabs (Escitalopram Oxalate) .Marland Kitchen.. 1 By Mouth Every Morning 4)  Hydrocodone-Acetaminophen 5-325 Mg Tabs (Hydrocodone-Acetaminophen) .Marland Kitchen.. 1 By Mouth Three Times A Day As Needed 5)  Losartan Potassium 100 Mg Tabs (Losartan Potassium) .Marland Kitchen.. 1 By Mouth Once Daily 6)  Seroquel 50 Mg Tabs (Quetiapine Fumarate) .Marland Kitchen.. 1 By Mouth At Bedtime 7)  Phentermine Hcl 37.5 Mg Caps (Phentermine Hcl) .Marland Kitchen.. 1po Once Daily 8)  Cephalexin 500 Mg Caps (Cephalexin) .Marland Kitchen.. 1 By Mouth Three Times A Day 9)  Meclizine Hcl 12.5 Mg Tabs (Meclizine Hcl) .Marland Kitchen.. 1 - 2 By Mouth Q 6 Hrs As Needed 10)  Promethazine Hcl 25 Mg Tabs (Promethazine Hcl) .Marland Kitchen.. 1 By Mouth Q 6 Hrs As Needed Nausea  Allergies (verified): No Known Drug Allergies  Past History:  Past Medical History: Last updated: 01/25/2009 Anemia-iron deficiency Bipolar Illness Allergic rhinitis Hyperlipidemia Hypertension HIV neg 2001 Lumbar disc disease chronic pain syndrome chronic smoker morbid obesity  Past Surgical History: Last updated: 01/25/2009 s/p bladder surgury with ? diverticulitis Hysterectomy Tubal ligation s/p right hand tendon surgury- index and middle  Social History: Last updated: 01/25/2009 moved from [place], then [place] to [place] 2009 Divorced 2 chidren disabled - bipolar Current Smoker Alcohol  use-no no illiict drugs  Risk Factors: Smoking Status: current (01/25/2009)  Review of Systems       all otherwise negative per pt -  Physical Exam  General:  alert and overweight-appearing.   Head:  normocephalic and atraumatic.   Eyes:  vision grossly intact, pupils equal, and pupils round.   Ears:  R ear normal and L ear normal.   Nose:  no external deformity and no nasal discharge.   Mouth:  no gingival abnormalities and pharynx pink and moist.   Neck:  supple and no masses.   Lungs:  normal respiratory effort and normal breath sounds.   Heart:  normal rate and regular rhythm.   Abdomen:  soft, non-tender, and normal bowel sounds.   Extremities:  no edema, no erythema    Impression & Recommendations:  Problem # 1:  INSOMNIA-SLEEP DISORDER-UNSPEC (ICD-780.52) to refill the seroquel for now, has appt with psych f/u in june 2011  Problem # 2:  MORBID OBESITY (ICD-278.01) some mild success so far, to cont diet, excercise efforts  Problem # 3:  HYPERTENSION (ICD-401.9)  Her updated medication list for this problem includes:    Losartan Potassium 100 Mg Tabs (Losartan potassium) .Marland Kitchen... 1 by mouth once daily  BP today: 120/80 Prior BP: 118/86 (03/29/2009)  Labs Reviewed: K+: 3.7 (01/25/2009) Creat: : 0.7 (01/25/2009)   Chol: 232 (01/25/2009)   HDL: 32.80 (01/25/2009)   TG: 94.0 (01/25/2009) stable overall by hx and exam, ok to continue meds/tx as is   Complete Medication List: 1)  Gabapentin 400 Mg Caps (Gabapentin) .Marland Kitchen.. 1 by mouth once daily 2)  Clonazepam 0.5 Mg Tabs (Clonazepam) .... Three times a day as needed 3)  Lexapro 10 Mg Tabs (Escitalopram oxalate) .Marland Kitchen.. 1 by mouth every morning 4)  Hydrocodone-acetaminophen 5-325 Mg Tabs (Hydrocodone-acetaminophen) .Marland Kitchen.. 1 by mouth three times a day as needed 5)  Losartan Potassium 100 Mg Tabs (Losartan potassium) .Marland Kitchen.. 1 by mouth once daily 6)  Seroquel 50 Mg Tabs (Quetiapine fumarate) .Marland Kitchen.. 1 by mouth at bedtime 7)   Phentermine Hcl 37.5 Mg Caps (Phentermine hcl) .Marland Kitchen.. 1po once daily 8)  Cephalexin 500 Mg Caps (Cephalexin) .Marland Kitchen.. 1 by mouth three times a day 9)  Meclizine Hcl 12.5 Mg Tabs (Meclizine hcl) .Marland Kitchen.. 1 - 2 by mouth q 6 hrs as needed 10)  Promethazine Hcl 25 Mg Tabs (Promethazine hcl) .Marland Kitchen.. 1 by mouth q 6 hrs as needed nausea  Patient Instructions: 1)  Continue all previous medications as before this visit  2)  please continue diet and excercise and slim fast  3)  Please schedule a follow-up appointment in 1 month. Prescriptions: SEROQUEL 50 MG TABS (QUETIAPINE FUMARATE) 1 by mouth at bedtime  #30 x 5   Entered and Authorized by:   Corwin Levins MD   Signed by:   Corwin Levins MD on 04/30/2009   Method  used:   Print then Give to Patient   RxID:   [id number]

## 2010-03-25 NOTE — Miscellaneous (Signed)
Summary: Orders Update  Clinical Lists Changes  Orders: Added new Test order of T-Measles (Rubeola) Antibody IgG [id number]) - Signed Added new Test order of T-Mumps Virus Antibody, IgG ([id number]) - Signed Added new Test order of T-Rubella Antibody ([id number]) - Signed Added new Test order of T-Hepatitis Acute Panel ([id number]) - Signed

## 2010-03-25 NOTE — Assessment & Plan Note (Signed)
Summary: DISCUSS DIET/NWS   Vital Signs:  Patient profile:   45 year old female Height:      63 inches Weight:      258 pounds BMI:     45.87 O2 Sat:      98 % on Room air Temp:     98.7 degrees F oral Pulse rate:   97 / minute Pulse rhythm:   regular Resp:     16 per minute BP sitting:   130 / 86  (left arm) Cuff size:   large  Vitals Entered By: Rock Nephew CMA (Jul 02, 2009 8:21 AM)  Nutrition Counseling: Patient's BMI is greater than 25 and therefore counseled on weight management options.  O2 Flow:  Room air CC: discuss weight loss, diet and smoking Is Patient Diabetic? No Pain Assessment Patient in pain? no        Primary Care Provider:  Corwin Levins MD  CC:  discuss weight loss and diet and smoking.  History of Present Illness:  Follow-Up Visit      This is a 46 year old woman who presents for Follow-up visit.  The patient denies chest pain, palpitations, dizziness, syncope, edema, SOB, DOE, PND, and orthopnea.  Since the last visit the patient notes no new problems or concerns.  The patient reports taking meds as prescribed, monitoring BP, and dietary compliance.  When questioned about possible medication side effects, the patient notes none.    Preventive Screening-Counseling & Management  Alcohol-Tobacco     Alcohol drinks/day: 0     Smoking Status: quit < 6 months     Smoking Cessation Counseling: yes     Smoke Cessation Stage: quit     Tobacco Counseling: to remain off tobacco products  Hep-HIV-STD-Contraception     Hepatitis Risk: no risk noted     HIV Risk: no risk noted     STD Risk: no risk noted      Sexual History:  currently monogamous.        Drug Use:  never.        Blood Transfusions:  no.    Clinical Review Panels:  CBC   WBC:  6.3 (01/25/2009)   RBC:  4.51 (01/25/2009)   Hgb:  13.4 (01/25/2009)   Hct:  39.7 (01/25/2009)   Platelets:  239.0 (01/25/2009)   MCV  88.1 (01/25/2009)   MCHC  33.7 (01/25/2009)   RDW  14.4  (01/25/2009)   PMN:  71.1 (01/25/2009)   Lymphs:  23.1 (01/25/2009)   Monos:  3.4 (01/25/2009)   Eosinophils:  2.3 (01/25/2009)   Basophil:  0.1 (01/25/2009)   Medications Prior to Update: 1)  Gabapentin 400 Mg Caps (Gabapentin) .Marland Kitchen.. 1 By Mouth Once Daily 2)  Clonazepam 0.5 Mg Tabs (Clonazepam) .... Three Times A Day As Needed 3)  Lexapro 10 Mg Tabs (Escitalopram Oxalate) .Marland Kitchen.. 1 By Mouth Every Morning 4)  Hydrocodone-Acetaminophen 5-325 Mg Tabs (Hydrocodone-Acetaminophen) .Marland Kitchen.. 1 By Mouth Three Times A Day As Needed 5)  Losartan Potassium 100 Mg Tabs (Losartan Potassium) .Marland Kitchen.. 1 By Mouth Once Daily 6)  Seroquel 50 Mg Tabs (Quetiapine Fumarate) .Marland Kitchen.. 1 By Mouth At Bedtime 7)  Phentermine Hcl 37.5 Mg Caps (Phentermine Hcl) .Marland Kitchen.. 1po Once Daily 8)  Cephalexin 500 Mg Caps (Cephalexin) .Marland Kitchen.. 1 By Mouth Three Times A Day 9)  Meclizine Hcl 12.5 Mg Tabs (Meclizine Hcl) .Marland Kitchen.. 1 - 2 By Mouth Q 6 Hrs As Needed 10)  Promethazine Hcl 25 Mg Tabs (Promethazine Hcl) .Marland KitchenMarland KitchenMarland Kitchen  1 By Mouth Q 6 Hrs As Needed Nausea  Current Medications (verified): 1)  Gabapentin 400 Mg Caps (Gabapentin) .Marland Kitchen.. 1 By Mouth Once Daily 2)  Clonazepam 0.5 Mg Tabs (Clonazepam) .... Three Times A Day As Needed 3)  Lexapro 10 Mg Tabs (Escitalopram Oxalate) .Marland Kitchen.. 1 By Mouth Every Morning 4)  Hydrocodone-Acetaminophen 5-325 Mg Tabs (Hydrocodone-Acetaminophen) .Marland Kitchen.. 1 By Mouth Three Times A Day As Needed 5)  Losartan Potassium 100 Mg Tabs (Losartan Potassium) .Marland Kitchen.. 1 By Mouth Once Daily 6)  Seroquel 50 Mg Tabs (Quetiapine Fumarate) .Marland Kitchen.. 1 By Mouth At Bedtime 7)  Phentermine Hcl 37.5 Mg Caps (Phentermine Hcl) .Marland Kitchen.. 1po Once Daily 8)  Meclizine Hcl 12.5 Mg Tabs (Meclizine Hcl) .Marland Kitchen.. 1 - 2 By Mouth Q 6 Hrs As Needed  Allergies (verified): No Known Drug Allergies  Past History:  Past Medical History: Reviewed history from 01/25/2009 and no changes required. Anemia-iron deficiency Bipolar Illness Allergic  rhinitis Hyperlipidemia Hypertension HIV neg 2001 Lumbar disc disease chronic pain syndrome chronic smoker morbid obesity  Past Surgical History: Reviewed history from 01/25/2009 and no changes required. s/p bladder surgury with ? diverticulitis Hysterectomy Tubal ligation s/p right hand tendon surgury- index and middle  Family History: Reviewed history from 01/25/2009 and no changes required. father died iwth ETOH cirrhosis, stroke, HTN mother with HTN sister and p-aunt overwt  and HTN aunt with DM uncle with DM maternal - several with emotional illness but not dx or tx son and 3 nephews with ADHD, and emotional issues  Social History: Reviewed history from 01/25/2009 and no changes required. moved from [place], then [place] to [place] 2009 Divorced 2 chidren disabled - bipolar Current Smoker Alcohol use-no no illiict drugsSmoking Status:  quit < 6 months Hepatitis Risk:  no risk noted HIV Risk:  no risk noted STD Risk:  no risk noted Sexual History:  currently monogamous Drug Use:  never Blood Transfusions:  no  Review of Systems  The patient denies anorexia, fever, weight loss, decreased hearing, chest pain, syncope, headaches, hemoptysis, abdominal pain, hematuria, difficulty walking, and depression.    Physical Exam  General:  alert, well-developed, well-nourished, well-hydrated, and overweight-appearing.   Head:  normocephalic and atraumatic.   Mouth:  good dentition, pharynx pink and moist, no erythema, and no posterior lymphoid hypertrophy.   Neck:  No deformities, masses, or tenderness noted. Lungs:  Normal respiratory effort, chest expands symmetrically. Lungs are clear to auscultation, no crackles or wheezes. Heart:  Normal rate and regular rhythm. S1 and S2 normal without gallop, murmur, click, rub or other extra sounds. Abdomen:  Bowel sounds positive,abdomen soft and non-tender without masses, organomegaly or hernias noted. Msk:  No deformity or  scoliosis noted of thoracic or lumbar spine.   Pulses:  R and L carotid,radial,femoral,dorsalis pedis and posterior tibial pulses are full and equal bilaterally Extremities:  No clubbing, cyanosis, edema, or deformity noted with normal full range of motion of all joints.   Neurologic:  alert & oriented X3, cranial nerves II-XII intact, strength normal in all extremities, sensation intact to light touch, gait normal, DTRs symmetrical and normal, finger-to-nose normal, heel-to-shin normal, RUE hyporeflexia, RLE hyporeflexia, LUE hyporeflexia, and LLE hyporeflexia.   Skin:  turgor normal, color normal, no rashes, no suspicious lesions, no ecchymoses, no petechiae, no purpura, no ulcerations, and no edema.   Psych:  Cognition and judgment appear intact. Alert and cooperative with normal attention span and concentration. No apparent delusions, illusions, hallucinations   Impression & Recommendations:  Problem #  1:  HEADACHE, CHRONIC (ICD-784.0) Assessment Improved  Her updated medication list for this problem includes:    Hydrocodone-acetaminophen 5-325 Mg Tabs (Hydrocodone-acetaminophen) .Marland Kitchen... 1 by mouth three times a day as needed  Problem # 2:  HYPERTENSION (ICD-401.9) Assessment: Unchanged  Her updated medication list for this problem includes:    Losartan Potassium 100 Mg Tabs (Losartan potassium) .Marland Kitchen... 1 by mouth once daily  BP today: 130/86 Prior BP: 130/78 (05/31/2009)  Prior 10 Yr Risk Heart Disease: Not enough information (05/31/2009)  Labs Reviewed: K+: 3.7 (01/25/2009) Creat: : 0.7 (01/25/2009)   Chol: 232 (01/25/2009)   HDL: 32.80 (01/25/2009)   TG: 94.0 (01/25/2009)  Problem # 3:  SMOKER (ICD-305.1) Assessment: Improved  Complete Medication List: 1)  Gabapentin 400 Mg Caps (Gabapentin) .Marland Kitchen.. 1 by mouth once daily 2)  Clonazepam 0.5 Mg Tabs (Clonazepam) .... Three times a day as needed 3)  Lexapro 10 Mg Tabs (Escitalopram oxalate) .Marland Kitchen.. 1 by mouth every morning 4)   Hydrocodone-acetaminophen 5-325 Mg Tabs (Hydrocodone-acetaminophen) .Marland Kitchen.. 1 by mouth three times a day as needed 5)  Losartan Potassium 100 Mg Tabs (Losartan potassium) .Marland Kitchen.. 1 by mouth once daily 6)  Seroquel 50 Mg Tabs (Quetiapine fumarate) .Marland Kitchen.. 1 by mouth at bedtime 7)  Phentermine Hcl 37.5 Mg Caps (Phentermine hcl) .Marland Kitchen.. 1po once daily 8)  Meclizine Hcl 12.5 Mg Tabs (Meclizine hcl) .Marland Kitchen.. 1 - 2 by mouth q 6 hrs as needed  Patient Instructions: 1)  Please schedule a follow-up appointment in 4 months. 2)  It is important that you exercise regularly at least 20 minutes 5 times a week. If you develop chest pain, have severe difficulty breathing, or feel very tired , stop exercising immediately and seek medical attention. 3)  You need to lose weight. Consider a lower calorie diet and regular exercise.  4)  Check your Blood Pressure regularly. If it is above 140/90: you should make an appointment.

## 2010-03-27 NOTE — Letter (Signed)
Summary: Ovidio Kin MD/[facility]  Ovidio Kin MD/[facility]   Imported By: Lester Wendover 03/10/2010 09:34:41  _____________________________________________________________________  External Attachment:    Type:   Image     Comment:   External Document

## 2010-06-01 LAB — CBC
HCT: 38.9 % (ref 36.0–46.0)
Hemoglobin: 13.4 g/dL (ref 12.0–15.0)
MCHC: 34.4 g/dL (ref 30.0–36.0)
MCV: 89.6 fL (ref 78.0–100.0)
Platelets: 231 10*3/uL (ref 150–400)
RBC: 4.34 MIL/uL (ref 3.87–5.11)
RDW: 14.1 % (ref 11.5–15.5)
WBC: 4.5 10*3/uL (ref 4.0–10.5)

## 2010-06-01 LAB — BASIC METABOLIC PANEL
BUN: 5 mg/dL — ABNORMAL LOW (ref 6–23)
CO2: 25 mEq/L (ref 19–32)
Calcium: 8.6 mg/dL (ref 8.4–10.5)
Chloride: 106 mEq/L (ref 96–112)
Creatinine, Ser: 0.61 mg/dL (ref 0.4–1.2)
GFR calc Af Amer: >60 mL/min (ref >60)
GFR calc non Af Amer: >60 mL/min (ref >60)
Glucose, Bld: 95 mg/dL (ref 70–99)
Potassium: 3.8 mEq/L (ref 3.5–5.1)
Sodium: 138 mEq/L (ref 135–145)

## 2010-06-01 LAB — PREGNANCY, URINE: Preg Test, Ur: NEGATIVE

## 2010-06-02 LAB — HCG, SERUM, QUALITATIVE: Preg, Serum: NEGATIVE

## 2010-06-03 LAB — DIFFERENTIAL
Basophils Absolute: 0 10*3/uL (ref 0.0–0.1)
Basophils Relative: 0 % (ref 0–1)
Eosinophils Absolute: 0.2 10*3/uL (ref 0.0–0.7)
Eosinophils Relative: 2 % (ref 0–5)
Lymphocytes Relative: 24 % (ref 12–46)
Lymphs Abs: 1.7 10*3/uL (ref 0.7–4.0)
Monocytes Absolute: 0.6 10*3/uL (ref 0.1–1.0)
Monocytes Relative: 8 % (ref 3–12)
Neutro Abs: 4.6 10*3/uL (ref 1.7–7.7)
Neutrophils Relative %: 65 % (ref 43–77)

## 2010-06-03 LAB — CBC
HCT: 38.9 % (ref 36.0–46.0)
HCT: 38.3 % (ref 36.0–46.0)
Hemoglobin: 13.8 g/dL (ref 12.0–15.0)
Hemoglobin: 13.4 g/dL (ref 12.0–15.0)
MCHC: 35.6 g/dL (ref 30.0–36.0)
MCHC: 35.2 g/dL (ref 30.0–36.0)
MCV: 90 fL (ref 78.0–100.0)
MCV: 91.2 fL (ref 78.0–100.0)
Platelets: 285 10*3/uL (ref 150–400)
Platelets: 263 10*3/uL (ref 150–400)
RBC: 4.32 MIL/uL (ref 3.87–5.11)
RBC: 4.19 MIL/uL (ref 3.87–5.11)
RDW: 13.9 % (ref 11.5–15.5)
RDW: 13.7 % (ref 11.5–15.5)
WBC: 7.4 10*3/uL (ref 4.0–10.5)
WBC: 7.1 10*3/uL (ref 4.0–10.5)

## 2010-06-03 LAB — BASIC METABOLIC PANEL
BUN: 8 mg/dL (ref 6–23)
CO2: 24 mEq/L (ref 19–32)
Calcium: 8.1 mg/dL — ABNORMAL LOW (ref 8.4–10.5)
Chloride: 110 mEq/L (ref 96–112)
Creatinine, Ser: 0.61 mg/dL (ref 0.4–1.2)
GFR calc Af Amer: >60 mL/min (ref >60)
GFR calc non Af Amer: >60 mL/min (ref >60)
Glucose, Bld: 83 mg/dL (ref 70–99)
Potassium: 3.9 mEq/L (ref 3.5–5.1)
Sodium: 138 mEq/L (ref 135–145)

## 2010-06-03 LAB — URINALYSIS, ROUTINE W REFLEX MICROSCOPIC
Bilirubin Urine: NEGATIVE
Glucose, UA: NEGATIVE mg/dL
Hgb urine dipstick: NEGATIVE
Ketones, ur: NEGATIVE mg/dL
Nitrite: NEGATIVE
Protein, ur: NEGATIVE mg/dL
Specific Gravity, Urine: 1.015 (ref 1.005–1.030)
Urobilinogen, UA: 1 mg/dL (ref 0.0–1.0)
pH: 6 (ref 5.0–8.0)

## 2010-06-03 LAB — COMPREHENSIVE METABOLIC PANEL
ALT: 33 U/L (ref 0–35)
AST: 20 U/L (ref 0–37)
Albumin: 3.4 g/dL — ABNORMAL LOW (ref 3.5–5.2)
Alkaline Phosphatase: 69 U/L (ref 39–117)
BUN: 10 mg/dL (ref 6–23)
CO2: 25 mEq/L (ref 19–32)
Calcium: 8.9 mg/dL (ref 8.4–10.5)
Chloride: 110 mEq/L (ref 96–112)
Creatinine, Ser: 0.63 mg/dL (ref 0.4–1.2)
GFR calc Af Amer: >60 mL/min (ref >60)
GFR calc non Af Amer: >60 mL/min (ref >60)
Glucose, Bld: 103 mg/dL — ABNORMAL HIGH (ref 70–99)
Potassium: 3.9 mEq/L (ref 3.5–5.1)
Sodium: 139 mEq/L (ref 135–145)
Total Bilirubin: 0.5 mg/dL (ref 0.3–1.2)
Total Protein: 6.8 g/dL (ref 6.0–8.3)

## 2010-06-03 LAB — GC/CHLAMYDIA PROBE AMP, GENITAL
Chlamydia, DNA Probe: NEGATIVE
GC Probe Amp, Genital: NEGATIVE

## 2010-06-03 LAB — PREGNANCY, URINE: Preg Test, Ur: NEGATIVE

## 2010-06-04 ENCOUNTER — Telehealth: Payer: Self-pay | Admitting: *Deleted

## 2010-06-04 NOTE — Telephone Encounter (Signed)
Patient requesting rx - She was given rx from MD last year to help her stop smoking and wants to take med again to help w/urges.

## 2010-06-05 ENCOUNTER — Encounter: Payer: Medicare Other | Attending: Surgery

## 2010-06-05 DIAGNOSIS — Z713 Dietary counseling and surveillance: Secondary | ICD-10-CM | POA: Insufficient documentation

## 2010-06-05 DIAGNOSIS — Z01818 Encounter for other preprocedural examination: Secondary | ICD-10-CM | POA: Insufficient documentation

## 2010-06-05 LAB — URINALYSIS, ROUTINE W REFLEX MICROSCOPIC
Bilirubin Urine: NEGATIVE
Glucose, UA: NEGATIVE mg/dL
Hgb urine dipstick: NEGATIVE
Ketones, ur: NEGATIVE mg/dL
Nitrite: NEGATIVE
Protein, ur: NEGATIVE mg/dL
Specific Gravity, Urine: 1.014 (ref 1.005–1.030)
Urobilinogen, UA: 1 mg/dL (ref 0.0–1.0)
pH: 6.5 (ref 5.0–8.0)

## 2010-06-05 LAB — COMPREHENSIVE METABOLIC PANEL
ALT: 16 U/L (ref 0–35)
AST: 14 U/L (ref 0–37)
Albumin: 3.4 g/dL — ABNORMAL LOW (ref 3.5–5.2)
Alkaline Phosphatase: 68 U/L (ref 39–117)
BUN: 6 mg/dL (ref 6–23)
CO2: 25 mEq/L (ref 19–32)
Calcium: 8.8 mg/dL (ref 8.4–10.5)
Chloride: 108 mEq/L (ref 96–112)
Creatinine, Ser: 0.68 mg/dL (ref 0.4–1.2)
GFR calc Af Amer: >60 mL/min (ref >60)
GFR calc non Af Amer: >60 mL/min (ref >60)
Glucose, Bld: 100 mg/dL — ABNORMAL HIGH (ref 70–99)
Potassium: 3.7 mEq/L (ref 3.5–5.1)
Sodium: 139 mEq/L (ref 135–145)
Total Bilirubin: 1 mg/dL (ref 0.3–1.2)
Total Protein: 7.2 g/dL (ref 6.0–8.3)

## 2010-06-05 LAB — DIFFERENTIAL
Basophils Absolute: 0 10*3/uL (ref 0.0–0.1)
Basophils Relative: 1 % (ref 0–1)
Eosinophils Absolute: 0.1 10*3/uL (ref 0.0–0.7)
Eosinophils Relative: 2 % (ref 0–5)
Lymphocytes Relative: 26 % (ref 12–46)
Lymphs Abs: 1.6 10*3/uL (ref 0.7–4.0)
Monocytes Absolute: 0.3 10*3/uL (ref 0.1–1.0)
Monocytes Relative: 5 % (ref 3–12)
Neutro Abs: 4.1 10*3/uL (ref 1.7–7.7)
Neutrophils Relative %: 67 % (ref 43–77)

## 2010-06-05 LAB — CBC
HCT: 42.6 % (ref 36.0–46.0)
Hemoglobin: 14.6 g/dL (ref 12.0–15.0)
MCHC: 34.2 g/dL (ref 30.0–36.0)
MCV: 91.1 fL (ref 78.0–100.0)
Platelets: 224 10*3/uL (ref 150–400)
RBC: 4.68 MIL/uL (ref 3.87–5.11)
RDW: 13.5 % (ref 11.5–15.5)
WBC: 6.1 10*3/uL (ref 4.0–10.5)

## 2010-06-05 LAB — URINE MICROSCOPIC-ADD ON

## 2010-06-05 LAB — URINE CULTURE
Colony Count: 40000
Special Requests: NEGATIVE

## 2010-06-05 LAB — RAPID URINE DRUG SCREEN, HOSP PERFORMED
Amphetamines: NOT DETECTED
Barbiturates: NOT DETECTED
Benzodiazepines: POSITIVE — AB
Cocaine: NOT DETECTED
Opiates: NOT DETECTED
Tetrahydrocannabinol: NOT DETECTED

## 2010-06-05 LAB — GC/CHLAMYDIA PROBE AMP, URINE
Chlamydia, Swab/Urine, PCR: NEGATIVE
GC Probe Amp, Urine: NEGATIVE

## 2010-06-05 LAB — ETHANOL: Alcohol, Ethyl (B): <5 mg/dL (ref 0–10)

## 2010-06-05 LAB — PREGNANCY, URINE: Preg Test, Ur: NEGATIVE

## 2010-06-05 NOTE — Telephone Encounter (Signed)
Needs to be seen

## 2010-06-05 NOTE — Telephone Encounter (Signed)
Please schedule Pt for F/U.

## 2010-06-09 ENCOUNTER — Encounter: Payer: Self-pay | Admitting: Internal Medicine

## 2010-06-09 ENCOUNTER — Ambulatory Visit (INDEPENDENT_AMBULATORY_CARE_PROVIDER_SITE_OTHER): Payer: Medicare Other | Admitting: Internal Medicine

## 2010-06-09 VITALS — BP 126/90 | HR 90 | Temp 98.2°F | Resp 16 | Wt 288.0 lb

## 2010-06-09 DIAGNOSIS — F17200 Nicotine dependence, unspecified, uncomplicated: Secondary | ICD-10-CM

## 2010-06-09 DIAGNOSIS — Z720 Tobacco use: Secondary | ICD-10-CM | POA: Insufficient documentation

## 2010-06-09 HISTORY — DX: Tobacco use: Z72.0

## 2010-06-09 MED ORDER — VARENICLINE TARTRATE 1 MG PO TABS: 1.0000 mg | ORAL_TABLET | Freq: Two times a day (BID) | ORAL | Status: AC

## 2010-06-09 MED ORDER — VARENICLINE TARTRATE 0.5 MG X 11 & 1 MG X 42 PO MISC: ORAL | Status: AC

## 2010-06-09 NOTE — Progress Notes (Signed)
  Subjective:    Patient ID: Shannon [name], female    DOB: 04-Nov-1965, 45 y.o.   MRN: [id number]  HPI She returns for f/up and tells me that she wants to ry Chantix to quit smoking. She feels well emotionally and is motivated to quit.    Review of Systems  Constitutional: Negative for fever, chills, diaphoresis, activity change, appetite change, fatigue and unexpected weight change.  HENT: Negative for facial swelling, neck pain and neck stiffness.   Respiratory: Negative for apnea, cough, choking, chest tightness, shortness of breath, wheezing and stridor.   Cardiovascular: Negative for chest pain, palpitations and leg swelling.  Gastrointestinal: Negative for nausea, vomiting, abdominal pain, diarrhea, constipation, blood in stool and abdominal distention.  Genitourinary: Negative for dysuria, frequency, hematuria and enuresis.  Musculoskeletal: Negative for myalgias, back pain, joint swelling, arthralgias and gait problem.  Skin: Negative for color change and rash.  Neurological: Negative for dizziness, tremors, seizures, syncope, facial asymmetry, speech difficulty, weakness, light-headedness, numbness and headaches.  Hematological: Negative for adenopathy. Does not bruise/bleed easily.  Psychiatric/Behavioral: Negative for suicidal ideas, hallucinations, behavioral problems, confusion, sleep disturbance, self-injury, dysphoric mood, decreased concentration and agitation. The patient is not nervous/anxious and is not hyperactive.        Objective:   Physical Exam  Vitals reviewed. Constitutional: She is oriented to person, place, and time. She appears well-developed and well-nourished. No distress.  HENT:  Head: Normocephalic and atraumatic.  Right Ear: External ear normal.  Left Ear: External ear normal.  Nose: Nose normal.  Mouth/Throat: Oropharynx is clear and moist. No oropharyngeal exudate.  Eyes: Conjunctivae and EOM are normal. Pupils are equal, round, and reactive to  light. Right eye exhibits no discharge. Left eye exhibits no discharge. No scleral icterus.  Neck: Normal range of motion. Neck supple. No JVD present. No tracheal deviation present. No thyromegaly present.  Cardiovascular: Normal rate, regular rhythm, normal heart sounds and intact distal pulses.  Exam reveals no gallop and no friction rub.   No murmur heard. Pulmonary/Chest: Effort normal. No respiratory distress. She has no wheezes. She has no rales. She exhibits no tenderness.  Abdominal: Soft. Bowel sounds are normal. She exhibits no distension and no mass. There is no tenderness. There is no rebound and no guarding.  Musculoskeletal: Normal range of motion. She exhibits no edema and no tenderness.  Lymphadenopathy:    She has no cervical adenopathy.  Neurological: She is alert and oriented to person, place, and time. She has normal reflexes. No cranial nerve deficit. Coordination normal.  Skin: Skin is warm and dry. No rash noted. She is not diaphoretic. No erythema. No pallor.  Psychiatric: She has a normal mood and affect. Her behavior is normal. Judgment and thought content normal.          Assessment & Plan:

## 2010-06-09 NOTE — Patient Instructions (Signed)
Smoking Cessation, Tips for Success  YOU CAN QUIT SMOKING  If you are ready to quit smoking, congratulations! You have chosen to help yourself be healthier. Cigarettes bring nicotine, tar, carbon monoxide, and other irritants into your body. Your lungs, heart, and blood vessels will be able to work better without these poisons. There are lots of different ways to quit smoking. Nicotine gum, nicotine patches, a nicotine inhaler, or nicotine nasal spray help with physical craving. Hypnosis, support groups, and medicines help break the habit of smoking. Here are some tips to help you quit for good.  1. Throw away all cigarettes.   2. Clean and remove all ashtrays from your home, work, and car.   3. On a card, write down your reasons for quitting. Carry the card with you and read it when you get the urge to smoke.   4. Cleanse your body of nicotine. Drink enough water and fluids to keep your urine clear or pale yellow. Do this after quitting to flush the nicotine from your body.   5. Learn to predict your moods. Do not let a bad situation be your excuse to have a cigarette. Some situations in your life might tempt you into wanting a cigarette.   6. Never have "just one" cigarette. It leads to wanting another and another. Remind yourself of your decision to quit.   7. Change habits associated with smoking. If you smoked while driving or when feeling stressed, try other activities to replace smoking. Stand up when drinking your coffee. Brush your teeth after eating. Sit in a different chair when you read the paper. Avoid alcohol while trying to quit, and try to drink fewer caffeinated beverages. Alcohol and caffeine may urge you to smoke.   8. Avoid foods and drinks that can trigger a desire to smoke, such as sugary or spicy foods and alcohol.   9. Ask people who smoke not to smoke around you.    10. Have something planned to do right after eating or having a cup of coffee. Take a walk or exercise to perk you up. This will help to keep you from overeating.   11. Try a relaxation exercise to calm you down and decrease your stress. Remember, you may be tense and nervous in the first 2 weeks after you quit, but this will pass.   12. Find new activities to keep your hands busy. Play with a pen, coin, or rubber band. Doodle or draw things on paper.   13. Brush your teeth right after eating. This will help cut down on the craving for the taste of tobacco after meals. You can try mouthwash, too.   14. Use oral substitutes, such as lemon drops, carrots, a cinnamon stick, or chewing gum, in place of cigarettes. Keep them handy so they are available when you have the urge to smoke.   15. When you have the urge to smoke, try deep breathing.   16. Designate your home as a nonsmoking area.   17. If you are a heavy smoker, ask your caregiver about a prescription for nicotine chewing gum. It can ease your withdrawal from nicotine.   18. Reward yourself. Set aside the cigarette money you save and buy yourself something nice.   19. Look for support from others. Join a support group or smoking cessation program. Ask someone at home or at work to help you with your plan to quit smoking.   20. Always ask yourself, "Do I need this cigarette or   is this just a reflex?" Tell yourself, "Today, I choose not to smoke," or "I do not want to smoke." You are reminding yourself of your decision to quit, even if you do smoke a cigarette.   HOW WILL I FEEL WHEN I QUIT SMOKING?   The benefits of not smoking start within days of quitting.    You may have symptoms of withdrawal because your body is used to nicotine (the addictive substance in cigarettes). You may crave cigarettes, be irritable, feel very hungry, cough often, get headaches, or have difficulty concentrating.     The withdrawal symptoms are only temporary. They are strongest when you first quit but will go away within 10 to 14 days.    When withdrawal symptoms occur, stay in control. Think about your reasons for quitting. Remind yourself that these are signs that your body is healing and getting used to being without cigarettes.    Remember that withdrawal symptoms are easier to treat than the major diseases that smoking can cause.    Even after the withdrawal is over, expect periodic urges to smoke. However, these cravings are generally short-lived and will go away whether you smoke or not. Do not smoke!    If you relapse and smoke again, do not lose hope. Of the people who quit, 75% relapse. Most smokers quit 3 times before they are successful.    If you relapse, do not give up! Plan ahead and think about what you will do the next time you get the urge to smoke.   LIFE AS A NONSMOKER: MAKE IT FOR A MONTH, MAKE IT FOR LIFE  Day 1 Hang this page where you will see it every day.  Day 2 Get rid of all ashtrays, matches, and lighters.  Day 3 Drink water. Breathe deeply between sips.  Day 4 Avoid places with smoke-filled air, such as bars, clubs, or the smoking section of restaurants.  Day 5 Keep track of how much money you save by not smoking.  Day 6 Avoid boredom. Keep a good book with you or go to the movies.  Day 7 Reward yourself! One week without smoking!  Day 8 Make a dental appointment to get your teeth cleaned.  Day 9 Decide how you will turn down a cigarette before it is offered to you.  Day 10 Review your reasons for quitting.  Day 11 Distract yourself. Stay active to keep your mind off smoking and to relieve tension.  Take a walk, exercise, read a book, do a crossword puzzle, or try a new hobby.  Day 12 Exercise. Get off the bus before your stop or use stairs instead of escalators.  Day 13 Call on friends for support and encouragement.  Day 14 Reward yourself! Two weeks without smoking!   Day 15 Practice deep breathing exercises.  Day 16 Bet a friend that you can stay a nonsmoker.  Day 17 Ask to sit in nonsmoking sections of restaurants.  Day 18 Hang up "No Smoking" signs.  Day 19 Think of yourself as a nonsmoker.  Day 20 Each morning, tell yourself you will not smoke.  Day 21 Reward yourself! Three weeks without smoking!  Day 22 Think of smoking in negative ways.    Remember how it stains your teeth, gives you bad breath, and shortens your breath.  Day 23 Eat a nutritious breakfast.  Day 24 Do not relive your days as a smoker.  Day 25 Hold a pencil in your hand when talking on   the telephone.  Day 26 Tell all your friends you do not smoke.  Day 27 Think about how much better food tastes.  Day 28 Remember, one cigarette is one too many.  Day 29 Take up a hobby that will keep your hands busy.  Day 30 Congratulations! One month without smoking! Give yourself a big reward.  Your caregiver can direct you to community resources or hospitals for support, which may include:   Group support.    Education.    Hypnosis.    Subliminal therapy.   Document Released: 11/08/2003 Document Re-Released: 05/06/2009  [employer] Patient Information 2011 [employer].

## 2010-06-09 NOTE — Assessment & Plan Note (Signed)
She was given pt ed material and an Rx for Chantix

## 2010-06-12 ENCOUNTER — Other Ambulatory Visit (HOSPITAL_COMMUNITY): Payer: Medicare Other

## 2010-06-13 ENCOUNTER — Other Ambulatory Visit (HOSPITAL_COMMUNITY): Payer: Medicare Other

## 2010-06-17 ENCOUNTER — Inpatient Hospital Stay (HOSPITAL_COMMUNITY): Admission: RE | Admit: 2010-06-17 | Payer: Medicare Other | Source: Ambulatory Visit | Admitting: Surgery

## 2010-06-24 ENCOUNTER — Encounter: Payer: Medicare Other | Attending: Surgery

## 2010-06-24 DIAGNOSIS — Z713 Dietary counseling and surveillance: Secondary | ICD-10-CM | POA: Insufficient documentation

## 2010-06-24 DIAGNOSIS — Z01818 Encounter for other preprocedural examination: Secondary | ICD-10-CM | POA: Insufficient documentation

## 2010-07-07 ENCOUNTER — Other Ambulatory Visit (HOSPITAL_COMMUNITY): Payer: Medicare Other

## 2010-07-08 NOTE — Op Note (Signed)
Shannon [name], Shannon [name]              ACCOUNT NO.:  [id number]   MEDICAL RECORD NO.:  [id number]          PATIENT TYPE:  OIB   LOCATION:  9315                          FACILITY:  [facility]   PHYSICIAN:  Zenaida Niece, M.D.DATE OF BIRTH:  01-Jul-1965   DATE OF PROCEDURE:  09/12/2008  DATE OF DISCHARGE:  09/12/2008                               OPERATIVE REPORT   PREOPERATIVE DIAGNOSIS:  Symptomatic leiomyomatous uterus.   POSTOPERATIVE DIAGNOSIS:  Symptomatic leiomyomatous uterus.   PROCEDURES:  Laparoscopic supracervical hysterectomy.   SURGEON:  Zenaida Niece, MD   ASSISTANT:  Huel Cote, MD   ANESTHESIA:  General endotracheal tube.   FINDINGS:  She had an enlarged irregular uterus with multiple myomas.  She had an otherwise normal abdomen and pelvis.   SPECIMEN:  Morcellated uterus sent for routine pathology.   ESTIMATED BLOOD LOSS:  150 mL.   COMPLICATIONS:  None.   PROCEDURE IN DETAIL:  The patient was taken to the operating room and  placed in the dorsal supine position.  General anesthesia was induced.  Both arms were tucked to her sides and legs were placed in mobile  stirrups.  Abdomen, perineum, and vagina were then prepped and draped in  the usual sterile fashion and a Foley catheter was inserted.  Infraumbilical skin was then infiltrated with 0.25% Marcaine and a 1-cm  vertical incision was made.  The Veress needle was inserted in the  peritoneal cavity and placement confirmed by an opening pressure of 7  mmHg after the water drop test.  CO2 gas was insufflated to a pressure  of 14 mmHg and the Veress needle was removed.  A size 10/11 disposable  trocar was then introduced with direct visualization without difficulty.  A 5-mm port was then placed on the right side under direct visualization  and 12-mm port on the left side under direct visualization.  Inspection  revealed the above-mentioned findings.  Starting first on the patient's  left side with  Harmonic scalpel Ace, while from the right side, the left  uterine cornu was grasped with a single-tooth tenaculum, the fallopian  tube, utero-ovarian ligament, round ligament, and broad ligament were  taken down with Harmonic scalpel Ace.  Anterior peritoneum was incised  across the anterior portion of the uterus to free the bladder inferior.  Uterine artery was identified and taken down with Harmonic scalpel.  This eventually achieved hemostasis.  Attention was turned to the right  side.  Using the Harmonic scalpel Ace, the fallopian tube, utero-ovarian  ligament, round ligament, broad ligaments were taken down.  The anterior  peritoneum was incised across the anterior portion of the uterus to meet  the incision coming from the left side.  Uterine artery was then  identified and taken down with the Harmonic scalpel Ace.  This required  several bites to achieve adequate hemostasis.  However, we did  eventually obtain hemostasis.  Next coming from the left side, using the  Harmonic scalpel Ace, using a drill, clamp, cut technique on maximum  power, we started to remove the uterus from the cervix.  This was done  just inferior to an anterior myoma.  This was done for a short distance  from the patient's left side and then finished from the patient's right  side removing the uterus from the cervix.  Pelvis was irrigated and  found to be adequately hemostatic.  The 12-mm port was removed and the  Gynecare Morcellex morcellator was inserted.  The uterus was then  morcellated in several bites.  This was difficult because the fibroids  were calcified and made them difficult to morcellate.  However, all  pieces of the uterus were eventually removed.  The morcellator was  removed and the 12-mm port was reinserted.  The pelvis was irrigated.  A  small amount of bleeding from the posterior cervical edge was controlled  with Harmonic scalpel Ace.  No further significant bleeding was noted.  A piece  of Intercede was then placed over the cervical stump.  The 12-mm  port was then removed.  While Dr. Senaida Ores looked through the scope, I  placed a 0 Vicryl suture through the fascia at this site to help reduce  the risk of hernia.  This was then tied.  The 5-mm port on the right  side was then removed under direct visualization.  All gas was allowed  to deflate from the abdomen and the umbilical trocar was removed.  Skin  incisions were then closed with interrupted subcuticular sutures of 4-0  Vicryl followed by Dermabond.  The patient's Foley catheter was removed.  She was taken down from stirrups.  She was awakened in the operating  room after tolerating the procedure well and was taken to the recovery  room in stable condition.  Counts were correct.  She received Ancef 1 g  IV at the beginning of the procedure and had PAS hose on throughout the  procedure.      Zenaida Niece, M.D.  Electronically Signed     TDM/MEDQ  D:  09/12/2008  T:  09/13/2008  Job:  [id number]

## 2010-07-08 NOTE — H&P (Signed)
Shannon [name], [name]              ACCOUNT NO.:  [id number]   MEDICAL RECORD NO.:  [id number]          PATIENT TYPE:  IPS   LOCATION:  0404                          FACILITY:  [facility]   PHYSICIAN:  Anselm Jungling, MD  DATE OF BIRTH:  January 28, 1966   DATE OF ADMISSION:  05/22/2008  DATE OF DISCHARGE:                       PSYCHIATRIC ADMISSION ASSESSMENT   TIME:  10:45 a.m.   IDENTIFYING INFORMATION:  A 45 year old female.  This is a voluntary  admission.   HISTORY OF PRESENT ILLNESS:  This is the first [facility] admission for this 7-  year-old initially referred by mental health where she endorsed hearing  voices that were telling her to kill herself.  Apparently the voices  have also told her that she needs to move and she has been moving about  the country from [place] to [place] and now [place].  She is  refusing to give additional history today, quite guarded and insisting  that she wants to leave.  Says she is unhappy with the cleanliness and  the manner of the staff.  Her history is taken primarily from the record  in the emergency room where she was medically cleared.  She had endorsed  that she had been doing some things to harm herself including  intentionally falling and was treated on May 14, 2008 in the emergency  room for a fall, and was prescribed Valium at that time.  She has  endorsed a history of bipolar disorder.   PAST PSYCHIATRIC HISTORY:  Her history is not clear.  She declines to  give any details today.  This is her first [facility] admission.  Prior  hospitalizations and medication trials are not clear, but she has  described being on Neurontin and Seroquel in the past.   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  Currently living in [place].  Declines  to discuss family arrangements or history.   MEDICAL HISTORY:  No regular primary care Shannon [name].  Medical problems  are pyuria, rule out UTI.  Fall  on May 14, 2008 with contusion of the  right buttock.   CURRENT MEDICATIONS:  1. She reports her medications as Neurontin 600 mg 2 tablets twice a      day.  2. Seroquel 25 mg 1 at bedtime.  3. Xanax, she was taking last in September.  4. It is noted that she was prescribed some Valium in the emergency      room on May 14, 2008.   DRUG ALLERGIES:  1. LITHIUM.  2. RISPERDAL.  3. DEPAKOTE.   PHYSICAL EXAMINATION:  Physical exam was done in the emergency room  where she was noted to be fully alert and coherent.  She endorsed  suicidal thoughts and admitted to a history of bipolar disorder.  She  did not describe a specific plan for suicide, but said that she had been  trying several things including causing herself to fall.   LABORATORY DATA:  Urine drug screen was positive for benzodiazepines.  Routine urinalysis was remarkable for cloudy yellow urine with  specific  gravity of 1.014, moderate leukocyte esterase, WBC 7-10 per high-powered  field, few bacteria and positive for Trichomonas.   MENTAL STATUS EXAM:  Fully alert female, guarded affect, poor eye  contact, insisting that she wants to leave.  Saying she does not want to  discuss her history.  Does not want to discuss how she feels.  Says that  she is feeling anxious, agitated, is uncomfortable here, wants to go  home.  Will not give any details of history.  Tried to discuss any  possible symptoms of UTI and she denies any symptoms.  Memory is intact.  She is oriented x3.   AXIS I:  Depressive disorder not otherwise specified, rule out psychosis  not otherwise specified.  AXIS II:  Deferred.  AXIS III:  Pyuria, rule out urinary tract infection.  AXIS IV:  Deferred.  AXIS V:  Current 40, past year not known.   PLAN:  The plan is to voluntarily admit her to alleviate any suicidal  thoughts.  She is on Librium 25 mg q.6 h. p.r.n. for any withdrawal.  We  will check urine for culture and sensitivity, GC and chlamydia,  check an  RPR and TSH.  We have also prescribed her Seroquel 50 mg q.6 h. p.r.n.  for any agitation.      Shannon [name], N.P.      Anselm Jungling, MD  Electronically Signed    MAS/MEDQ  D:  05/23/2008  T:  05/23/2008  Job:  [id number]

## 2010-07-08 NOTE — Op Note (Signed)
NAMESOLEIL, MAS              ACCOUNT NO.:  [id number]   MEDICAL RECORD NO.:  [id number]          PATIENT TYPE:  AMB   LOCATION:  SDC                           FACILITY:  [facility]   PHYSICIAN:  Zenaida Niece, M.D.DATE OF BIRTH:  16-Oct-1965   DATE OF PROCEDURE:  07/25/2008  DATE OF DISCHARGE:                               OPERATIVE REPORT   PREOPERATIVE DIAGNOSIS:  Pelvic pain.   POSTOPERATIVE DIAGNOSES:  Pelvic pain and leiomyomatous uterus.   PROCEDURE:  Diagnostic laparoscopy.   SURGEON:  Zenaida Niece, MD   ASSISTANT:  None.   ANESTHESIA:  General endotracheal tube.   FINDINGS:  She had an enlarged uterus with multiple myomas.  She had an  otherwise normal pelvis and abdomen with evidence of prior bilateral  tubal ligation.   SPECIMENS:  None.   ESTIMATED BLOOD LOSS:  Minimal.   COMPLICATIONS:  None.   PROCEDURE IN DETAIL:  The patient was taken to the operating room and  placed in the dorsal supine position.  Left arm was tucked to her side.  General anesthesia was induced and she was placed in mobile stirrups.  Abdomen was then prepped and draped in the usual sterile fashion,  bladder drained with a red Robinson catheter, Hulka tenaculum applied to  the cervix for uterine manipulation.  Infraumbilical skin was  infiltrated with 0.5% Marcaine and a o.75 cm vertical incision was made.  The Veress needle was inserted into the peritoneal cavity and placement  confirmed by the water drop test and an opening pressure of 9 mmHg.  CO2  gas was insufflated to a pressure of 14 mmHg and the Veress needle was  removed.  A 5-mm trocar was then introduced under direct visualization  with a 5-mm scope.  A 5-mm port was then also placed on the left side  under direct visualization.  Inspection revealed the above-mentioned  findings.  Normal appendix and normal liver edge.  Uterus was enlarged  and irregular with fibroids.  Tubes and ovaries were normal with  evidence of  prior tubal ligation.  All peritoneal surfaces appeared  normal.  There was no evidence of endometriosis or significant  adhesions.  There was a small free-floating phlebolith in the anterior  cul-de-sac and this was removed through the 5-mm port.  Other than her  fibroids, no source for her pain was identified and she did not want  anything done about the fibroids at this time.  All gas was allowed to  deflate from the abdomen and the trocars were removed.  Skin incisions  were closed with interrupted subcuticular  sutures of 4-0 Vicryl followed by Dermabond.  The Hulka tenaculum was  then removed.  The patient tolerated the procedure well.  She was taken  to the recovery room in stable condition.  Counts were correct, and she  had PAS hose on throughout the procedure.      Zenaida Niece, M.D.  Electronically Signed     TDM/MEDQ  D:  07/25/2008  T:  07/25/2008  Job:  [id number]

## 2010-07-08 NOTE — H&P (Signed)
Shannon [name], Shannon [name]              ACCOUNT NO.:  [id number]   MEDICAL RECORD NO.:  [id number]          PATIENT TYPE:  AMB   LOCATION:  SDC                           [facility]ILITY[facility]   PHYSICIAN:  Zenaida Niece, M.D.DATE OF BIRTH:  [birth year]   DATE OF ADMISSION:  DATE OF DISCHARGE:                              HISTORY & PHYSICAL   CHIEF COMPLAINT:  Symptomatic leiomyomatous uterus.   HISTORY AND PHYSICAL:  This is a 45 year old female para 2-0-2-2, whom I  first saw in May of this year.  At that time, she was having regular  menses every month.  She was having left lower quadrant pain for the  past couple of months are recently getting worse.  The pain radiates to  her back and into her left groin.  Physical exam at that point had a  slightly enlarged irregular uterus and tender bilateral adnexa.  The  patient elected to proceed with laparoscopy, which was performed on July 25, 2008.  This revealed an enlarged uterus with multiple fibroids.  She  had an otherwise normal pelvis and abdomen with evidence of prior tubal  ligation.  These findings have been discussed with the patient and she  now wishes to proceed with definitive surgical therapy with hysterectomy  and is being admitted for this at this time.   PAST OB HISTORY:  Two vaginal deliveries at term without complications  and 2 elective terminations.   PAST MEDICAL HISTORY:  Hypertension, bipolar disorder, and back pain.   SURGERIES:  The above mentioned laparoscopy as well as previous  bilateral tubal ligation, she has also had surgery for a urethral  diverticulum.   ALLERGIES:  None known.   CURRENT MEDICATIONS:  Clonazepam, Seroquel, and amlodipine.   SOCIAL HISTORY:  The patient is divorced and does smoke at least a half-  pack of cigarettes a day.   GYN HISTORY:  Questionable history of an abnormal Pap smear.  Preoperative Pipelle was benign with a possible functional polyp and Pap  smear is normal.  She does  have a history of Trichomonas.   REVIEW OF SYSTEMS:  She does have occasional constipation and urinary  hesitancy.  No chest pain, palpitations, or shortness of breath.   PHYSICAL EXAMINATION:  GENERAL:  This is a slightly obese black female  in no acute distress.  VITAL SIGNS:  Last weight office was approximately 230 pounds.  NECK:  Supple without lymphadenopathy or thyromegaly.  LUNGS:  Clear to auscultation.  HEART:  Regular rate and rhythm without murmur.  ABDOMEN:  Soft, nontender, and nondistended without palpable mass.  EXTREMITIES:  No edema and are nontender.  PELVIC:  External genitalia has no lesions.  On speculum exam, the  cervix is normal and Pipelle sounded to 10 cm.  BIMANUAL:  The uterus is slightly enlarged and irregular and she is  slightly tender across her pelvis.   ASSESSMENT:  Pelvic pain, most likely due to uterine leiomyomas.  The  patient has a previous laparoscopy which documents no other source for  her pelvic pain.  The patient wishes to proceed with definitive  surgical  therapy.  Risks of surgery have been discussed as well the permanency of  the procedure.   PLAN:  To admit the patient on the day of surgery for laparoscopic  supracervical hysterectomy.      Zenaida Niece, M.D.  Electronically Signed     TDM/MEDQ  D:  09/11/2008  T:  09/12/2008  Job:  [id number]

## 2010-07-10 ENCOUNTER — Other Ambulatory Visit (INDEPENDENT_AMBULATORY_CARE_PROVIDER_SITE_OTHER): Payer: Self-pay | Admitting: Surgery

## 2010-07-10 ENCOUNTER — Encounter (HOSPITAL_COMMUNITY): Payer: Medicare Other

## 2010-07-10 ENCOUNTER — Ambulatory Visit (HOSPITAL_COMMUNITY)
Admission: RE | Admit: 2010-07-10 | Discharge: 2010-07-10 | Disposition: A | Discharge status: Home / Self Care | Payer: Medicare Other | Source: Ambulatory Visit | Attending: Surgery | Admitting: Surgery

## 2010-07-10 DIAGNOSIS — Z01818 Encounter for other preprocedural examination: Secondary | ICD-10-CM | POA: Insufficient documentation

## 2010-07-10 DIAGNOSIS — Z01811 Encounter for preprocedural respiratory examination: Secondary | ICD-10-CM

## 2010-07-10 DIAGNOSIS — M6250 Muscle wasting and atrophy, not elsewhere classified, unspecified site: Secondary | ICD-10-CM | POA: Insufficient documentation

## 2010-07-10 DIAGNOSIS — I10 Essential (primary) hypertension: Secondary | ICD-10-CM

## 2010-07-10 DIAGNOSIS — Z01810 Encounter for preprocedural cardiovascular examination: Secondary | ICD-10-CM | POA: Insufficient documentation

## 2010-07-10 DIAGNOSIS — Z01812 Encounter for preprocedural laboratory examination: Secondary | ICD-10-CM | POA: Insufficient documentation

## 2010-07-10 LAB — CBC
HCT: 39.3 % (ref 36.0–46.0)
Hemoglobin: 13.4 g/dL (ref 12.0–15.0)
MCH: 28.6 pg (ref 26.0–34.0)
MCHC: 34.1 g/dL (ref 30.0–36.0)
MCV: 83.8 fL (ref 78.0–100.0)
Platelets: 295 10*3/uL (ref 150–400)
RBC: 4.69 MIL/uL (ref 3.87–5.11)
RDW: 14.8 % (ref 11.5–15.5)
WBC: 7.3 10*3/uL (ref 4.0–10.5)

## 2010-07-10 LAB — COMPREHENSIVE METABOLIC PANEL
ALT: 19 U/L (ref 0–35)
AST: 15 U/L (ref 0–37)
Albumin: 3.5 g/dL (ref 3.5–5.2)
Alkaline Phosphatase: 109 U/L (ref 39–117)
BUN: 8 mg/dL (ref 6–23)
CO2: 26 mEq/L (ref 19–32)
Calcium: 8.6 mg/dL (ref 8.4–10.5)
Chloride: 104 mEq/L (ref 96–112)
Creatinine, Ser: 0.53 mg/dL (ref 0.4–1.2)
GFR calc Af Amer: >60 mL/min (ref >60)
GFR calc non Af Amer: >60 mL/min (ref >60)
Glucose, Bld: 73 mg/dL (ref 70–99)
Potassium: 4 mEq/L (ref 3.5–5.1)
Sodium: 138 mEq/L (ref 135–145)
Total Bilirubin: 0.7 mg/dL (ref 0.3–1.2)
Total Protein: 7.9 g/dL (ref 6.0–8.3)

## 2010-07-10 LAB — DIFFERENTIAL
Basophils Absolute: 0 10*3/uL (ref 0.0–0.1)
Basophils Relative: 0 % (ref 0–1)
Eosinophils Absolute: 0.3 10*3/uL (ref 0.0–0.7)
Eosinophils Relative: 4 % (ref 0–5)
Lymphocytes Relative: 28 % (ref 12–46)
Lymphs Abs: 2 10*3/uL (ref 0.7–4.0)
Monocytes Absolute: 0.5 10*3/uL (ref 0.1–1.0)
Monocytes Relative: 6 % (ref 3–12)
Neutro Abs: 4.6 10*3/uL (ref 1.7–7.7)
Neutrophils Relative %: 62 % (ref 43–77)

## 2010-07-10 LAB — SURGICAL PCR SCREEN: Staphylococcus aureus: INVALID — AB

## 2010-07-11 NOTE — Discharge Summary (Signed)
Shannon [name], Shannon [name] NO.:  [id number]   MEDICAL RECORD NO.:  [id number]          PATIENT TYPE:  IPS   LOCATION:  0404                          FACILITY:  [facility]   PHYSICIAN:  Jasmine Pang, M.D. DATE OF BIRTH:  12-Sep-1965   DATE OF ADMISSION:  05/22/2008  DATE OF DISCHARGE:  05/25/2008                               DISCHARGE SUMMARY   IDENTIFICATION:  This is a 45 year old female who was admitted on a  voluntary basis.   HISTORY OF PRESENT ILLNESS:  This is the first [facility] admission for this 19-  year-old initially referred by her mental health Shannon [name].  She had  endorsed to them that she was hearing voices that were telling her to  kill herself.  For further admission information, see psychiatric  admission and assessment.   PHYSICAL FINDINGS:  Physical exam was done in the emergency room.  There  were no acute physical or medical problems noted.   LABORATORY DATA:  Urine drug screen was positive for benzodiazepines.  Routine urinalysis was remarkable for cloudy, yellow urine with specific  gravity of 1.014, moderate leukocyte esterase, WBCs 7 to 10 per high-  powered field, few bacteria, and positive for Trichomonas.   HOSPITAL COURSE:  Upon admission, the patient was placed on Ambien 10 mg  p.o. q.h.s. p.r.n. insomnia, Seroquel 50 mg p.o. q.6 hours p.r.n.  agitation and psychosis, Librium 25 mg p.o. q.4 h. p.r.n. withdrawal,  and Seroquel 100 mg p.o. b.i.d. and 400 mg p.o. q.h.s.  In individual  sessions, the patient was guarded, suspicious, and irritable.  She  wanted discharge.  She stated this place is unclean.  She states she  has been diagnosed with bipolar disorder.  She has been on Seroquel in  the past.  On May 24, 2008, she was angry and irritable.  She was  focused on wanting to go home.  Sleep was good and appetite was good.  On May 25, 2008, the patient remained angry and irritable.  She was  refusing labs, refusing any medications.  She was  focused on wanting to  go home.  She was seen by [name] as a second opinion.  He found that  she was not suicidal.  She stated she felt she will get more depressed  if she stays here.  She denies any auditory or visual hallucinations,  suicidal or homicidal ideation.  She does not meet criteria for  commitment and it was felt the patient could be discharged with follow  up at [facility].  She was much calmer when she realized  she was going home.   DISCHARGE DIAGNOSES:  Axis I:  Bipolar disorder, not otherwise  specified.  Axis II:  Features of personality disorder, not otherwise specified.  Axis III:  Pyuria, rule out urinary tract infection. The patient refused  any medications and refused further blood drawing to check for sexually  transmitted diseases.  Axis IV:  Moderate (problems with primary support group, burden of  psychiatric illness, other psychosocial stressors).  Axis V:  Global assessment of functioning was 50 upon discharge.  GAF  was 40 upon admission.  GAF highest past year was 60 to 65.   DISCHARGE PLANS:  There was no specific activity level or dietary  restrictions.   POSTHOSPITAL CARE PLANS:  The patient will be called by Eloisa Northern, our case manager on Monday, May 28, 2008, to advise her  phone appointment time at [facility].   DISCHARGE MEDICATIONS:  1. Seroquel 100 mg twice a day.  2. Seroquel 400 mg at bedtime.  3. Gabapentin 600 mg twice a day.      Jasmine Pang, M.D.  Electronically Signed     BHS/MEDQ  D:  06/07/2008  T:  06/08/2008  Job:  [id number]

## 2010-07-13 LAB — MRSA CULTURE

## 2010-07-14 ENCOUNTER — Inpatient Hospital Stay (HOSPITAL_COMMUNITY)
Admission: RE | Admit: 2010-07-14 | Discharge: 2010-07-15 | DRG: 621 | Disposition: A | Discharge status: Home / Self Care | Payer: Medicare Other | Source: Ambulatory Visit | Attending: Surgery | Admitting: Surgery

## 2010-07-14 DIAGNOSIS — K219 Gastro-esophageal reflux disease without esophagitis: Secondary | ICD-10-CM | POA: Diagnosis present

## 2010-07-14 DIAGNOSIS — Z6841 Body Mass Index (BMI) 40.0 and over, adult: Secondary | ICD-10-CM

## 2010-07-14 DIAGNOSIS — Z87891 Personal history of nicotine dependence: Secondary | ICD-10-CM

## 2010-07-14 DIAGNOSIS — M549 Dorsalgia, unspecified: Secondary | ICD-10-CM | POA: Diagnosis present

## 2010-07-14 DIAGNOSIS — Z01810 Encounter for preprocedural cardiovascular examination: Secondary | ICD-10-CM

## 2010-07-14 DIAGNOSIS — G8929 Other chronic pain: Secondary | ICD-10-CM | POA: Diagnosis present

## 2010-07-14 DIAGNOSIS — I10 Essential (primary) hypertension: Secondary | ICD-10-CM | POA: Diagnosis present

## 2010-07-14 DIAGNOSIS — Z01812 Encounter for preprocedural laboratory examination: Secondary | ICD-10-CM

## 2010-07-14 DIAGNOSIS — F319 Bipolar disorder, unspecified: Secondary | ICD-10-CM | POA: Diagnosis present

## 2010-07-14 DIAGNOSIS — Z79899 Other long term (current) drug therapy: Secondary | ICD-10-CM

## 2010-07-14 HISTORY — PX: LAPAROSCOPIC GASTRIC BANDING: SHX1100

## 2010-07-15 ENCOUNTER — Inpatient Hospital Stay (HOSPITAL_COMMUNITY): Payer: Medicare Other

## 2010-07-15 LAB — DIFFERENTIAL
Basophils Absolute: 0 10*3/uL (ref 0.0–0.1)
Basophils Relative: 0 % (ref 0–1)
Eosinophils Absolute: 0 10*3/uL (ref 0.0–0.7)
Eosinophils Relative: 0 % (ref 0–5)
Lymphocytes Relative: 12 % (ref 12–46)
Lymphs Abs: 1.2 10*3/uL (ref 0.7–4.0)
Monocytes Absolute: 0.4 10*3/uL (ref 0.1–1.0)
Monocytes Relative: 4 % (ref 3–12)
Neutro Abs: 8.4 10*3/uL — ABNORMAL HIGH (ref 1.7–7.7)
Neutrophils Relative %: 84 % — ABNORMAL HIGH (ref 43–77)

## 2010-07-15 LAB — CBC
HCT: 39.4 % (ref 36.0–46.0)
Hemoglobin: 13 g/dL (ref 12.0–15.0)
MCH: 27.7 pg (ref 26.0–34.0)
MCHC: 33 g/dL (ref 30.0–36.0)
MCV: 84 fL (ref 78.0–100.0)
Platelets: 293 10*3/uL (ref 150–400)
RBC: 4.69 MIL/uL (ref 3.87–5.11)
RDW: 14.9 % (ref 11.5–15.5)
WBC: 9.9 10*3/uL (ref 4.0–10.5)

## 2010-07-21 NOTE — Discharge Summary (Signed)
NAMECAILIN, GEBEL              ACCOUNT NO.:  [id number]  MEDICAL RECORD NO.:  [id number]           PATIENT TYPE:  I  LOCATION:  1527                         FACILITY:  [facility]  PHYSICIAN:  Sandria Bales. Ezzard Standing, M.D.  DATE OF BIRTH:  Jul 01, 1965  DATE OF ADMISSION:  07/14/2010 DATE OF DISCHARGE:  15 Jul 2010                              DISCHARGE SUMMARY  DISCHARGE DIAGNOSES: 1. Morbid obesity with weight 289, body mass index 51.2. 2. Bipolar disease, followed by [facility]     Department. 3. Hypertension. 4. History of smoking which she has quit.  Currently on Chantix. 5. Chronic back pain. 6. History of gastroesophageal reflux disease. 7. Recent teeth surgery for which she has recovered.  OPERATION PERFORMED:  The patient had an AP lap band placed on Jul 14, 2010.  This was a AP large lap band.  HISTORY OF ILLNESS:  Ms. Pike is a 45 year old black female who sees Dr. Sanda Linger from a medical standpoint,  Dr. Lavina Hamman from GYN standpoint.  She has been obese much of her adult life and has been through our program for bariatric surgery.  Actually, I originally saw her in October 2010 for different reasons.    I saw her back in December 2011 in which she seemed to be more motivated but one thing she had been smoking, which she quit.  I am not sure if she had a little bit of trouble with that since quitting smoking.  She also demonstrated significant weight gain between October 2010 to December 2011.  Her weight in October 2010 was 248 pounds with BMI of 43.29.  Her weight in December 2011 was 289 with a BMI of 51.2.  Her comorbid problems in addition to her weight have been: 1. She has bipolar disease followed by [facility] Department. 2. She has had a hypertension. 3. She has had chronic back pain. 4. She has a history of gastroesophageal reflux disease.  HOSPITAL COURSE:  On the day of admission, she was taken to the operating  room where she underwent placement of an AP large band.    She has done well from her surgery.  Her x-ray this morning showed good band location.  Her hemoglobin was 13, white blood count 9900 and she is now ready for discharge.  DISCHARGE INSTRUCTIONS:  Her discharge instructions include continuing her home medications which includes vitamins.   She is to stop her naproxen and aspirin.   She takes gabapentin 40 mg 3 times a day, clonazepam 1 mg 3 times a day p.r.n., Seroquel 25 mg twice a day and Chantix 2 mg twice a day.  I will give her Roxicet elixir 200 cc for pain control.   She should not drive for 3 to 4 days.   She is still on a lap band diet, going home until the nutritionist advance her diet.   She should have a followup with the nutritionist who will drive her diet.   She can shower starting tomorrow.   She is tosee me back in 2 or 3 weeks for followup.  CONDITION ON  DISCHARGE:  Her discharge condition is good.   Sandria Bales. Ezzard Standing, M.D., FACS   DHN/MEDQ  D:  07/15/2010  T:  07/15/2010  Job:  [id number]  cc:   Sanda Linger, MD [address] [place] [address] [place] [address]  Zenaida Niece, M.D. Fax: [phone]  Electronically Signed by Ovidio Kin M.D. on 07/21/2010 11:16:58 AM

## 2010-07-21 NOTE — Op Note (Signed)
NAMESEHAR, SEDANO              ACCOUNT NO.:  [id number]  MEDICAL RECORD NO.:  [id number]           PATIENT TYPE:  I  LOCATION:  1527                         FACILITY:  [facility]  PHYSICIAN:  Sandria Bales. Ezzard Standing, M.D.  DATE OF BIRTH:  September 04, 1965  DATE OF PROCEDURE:  07/14/2010                              OPERATIVE REPORT   PREOPERATIVE DIAGNOSIS:  Morbid obesity (weight 289, body mass index 51.2).  POSTOPERATIVE DIAGNOSIS:  Morbid obesity (weight 289, body mass index 51.2).  PROCEDURE:  Laparoscopic band placement with an AP large band.  SURGEON:  Sandria Bales. Ezzard Standing, MD  FIRST ASSISTANTSharlet Salina T. Hoxworth, MD  ANESTHESIA:  General endotracheal with approximately 50 cc of 0.25% Marcaine.  COMPLICATIONS:  None.  INDICATIONS FOR PROCEDURE:  Ms. Dau is a 45 year old black female who sees Dr. Sanda Linger as her primary medical doctor.  She had moved to [place] from [place] around 2009.  She struggled with obesity all her life, in fact, she has recently quit smoking and gained more weight.  She had been through our preop bariatric program and is interested in a lap band.    I discussed with her the indications, potential complication of lap band surgery.  Potential complications include, but not limited to, bleeding, infection, bowel injury, slippage and erosion of the band and long-term nutritional consequences.  She also understands that the band is a tool and that she has to follow our guidelines for diet and exercise to loose weight.  She also understands the importance of remaining smoke free.  OPERATIVE NOTE:  The patient was placed in a supine position, given a general endotracheal anesthetic.  Her abdomen was prepped with ChloraPrep and sterilely draped.    Time-out was held and surgical checklist run.  I used an 11 mm Optiview through an incision in the left upper quadrant to get into the abdominal cavity.  The abdomen that I could see was unremarkable.   Right and left lobes of liver were unremarkable.  The stomach was unremarkable.  The bowel was covered with omentum, which was unremarkable.  I then placed a5 mm subxiphoid trocar for the liver retractor, a 15 mm right upper quadrant trocar for introduction of the band, 11 mm to the right of midline, an 11 mm trocars to the left of midline and a 5 mm trocar left lateral subcostal area.  I made a window to the left of the gastroesophageal junction at the angle of Hiss.  The dissection was then carried out along the gastrohepatic ligament, which showed the right crus, entered the area behind the esophagogastric junction and passed the  finger dissector around the proximal stomach.  I used an AP large band, but because of her size and the amount of fat, I followed around the gastrohepatic junction.  The patient had no evidence of hiatal hernia.  She did have some reflux and in part this was because of her recent weight gain since quitting smoking.  The band was placed around the proximal stomach.  I used an AP large band and imbricated to the stomach around the band laterally using a 2-0 Ethibond  suture with grommets of tie-knot to cinch these down.  I put 3 sutures to embricate the stomach around the band.  The lap band appeared to lay in the correct position.  I removed all the other trocars.  The sizing tubing of the band was brought out through the right lateral abdominal incision.  The trocar was then deflated.  In turn, there was no bleeding at trocar site.    I made a pocket using the right subcostal incision.  I attached the band tubing to the reservoir and placed it in the pocket.  There was a mesh backing to the port.  The subcu tissues were closed with 2-0 Vicryl suture.  The skin at each site was closed with a 5-0 Monocryl suture, painted with Dermabond.    The patient tolerated the procedure well, was transferred to the recovery room in good condition.  Her sponge and needle  count were correct at the end of the case.   Sandria Bales. Ezzard Standing, M.D., FACS   DHN/MEDQ  D:  07/14/2010  T:  07/14/2010  Job:  [id number]  cc:   Sanda Linger, MD [address] [place] [address] [place] [address]  Zenaida Niece, M.D. Fax: [phone]  Electronically Signed by Ovidio Kin M.D. on 07/21/2010 11:10:10 AM

## 2010-07-22 IMAGING — CR DG LUMBAR SPINE 2-3V
3 series · 3 of 3 positions shown · non-contrast
Comparison: None

CLINICAL DATA: Fell, back pain

LUMBAR SPINE - 2-3 VIEW

[t l-spine lat]
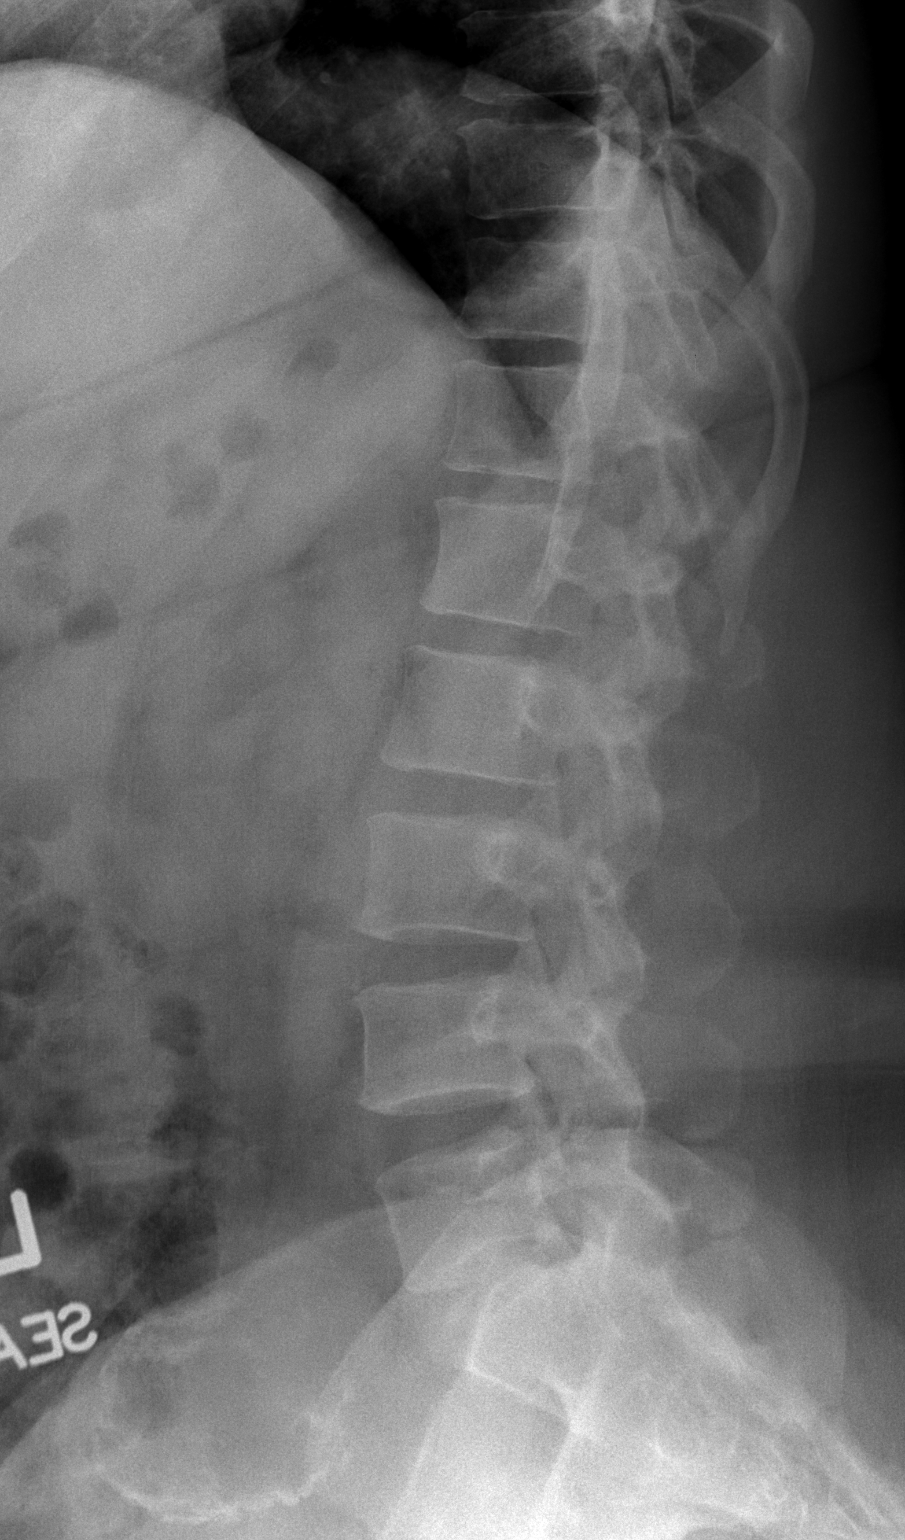

[t l-spine l5-s1 spot]
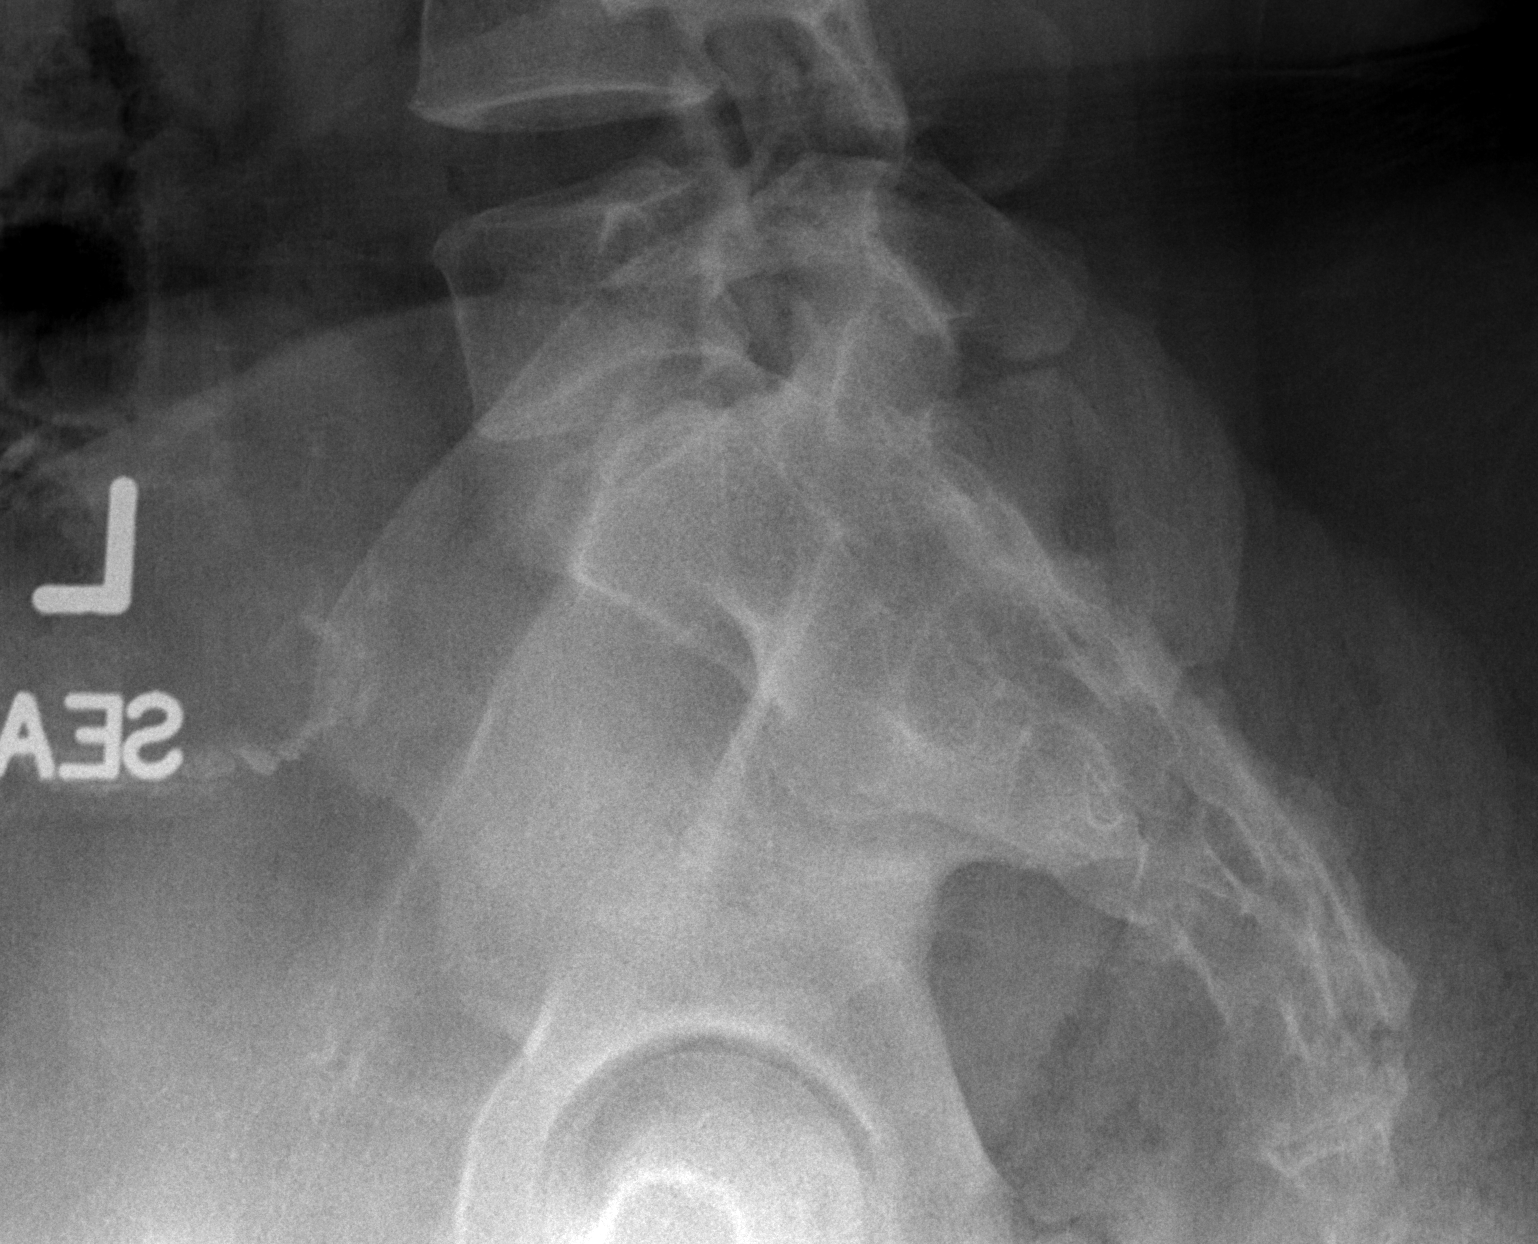

[t l-spine a.p.]
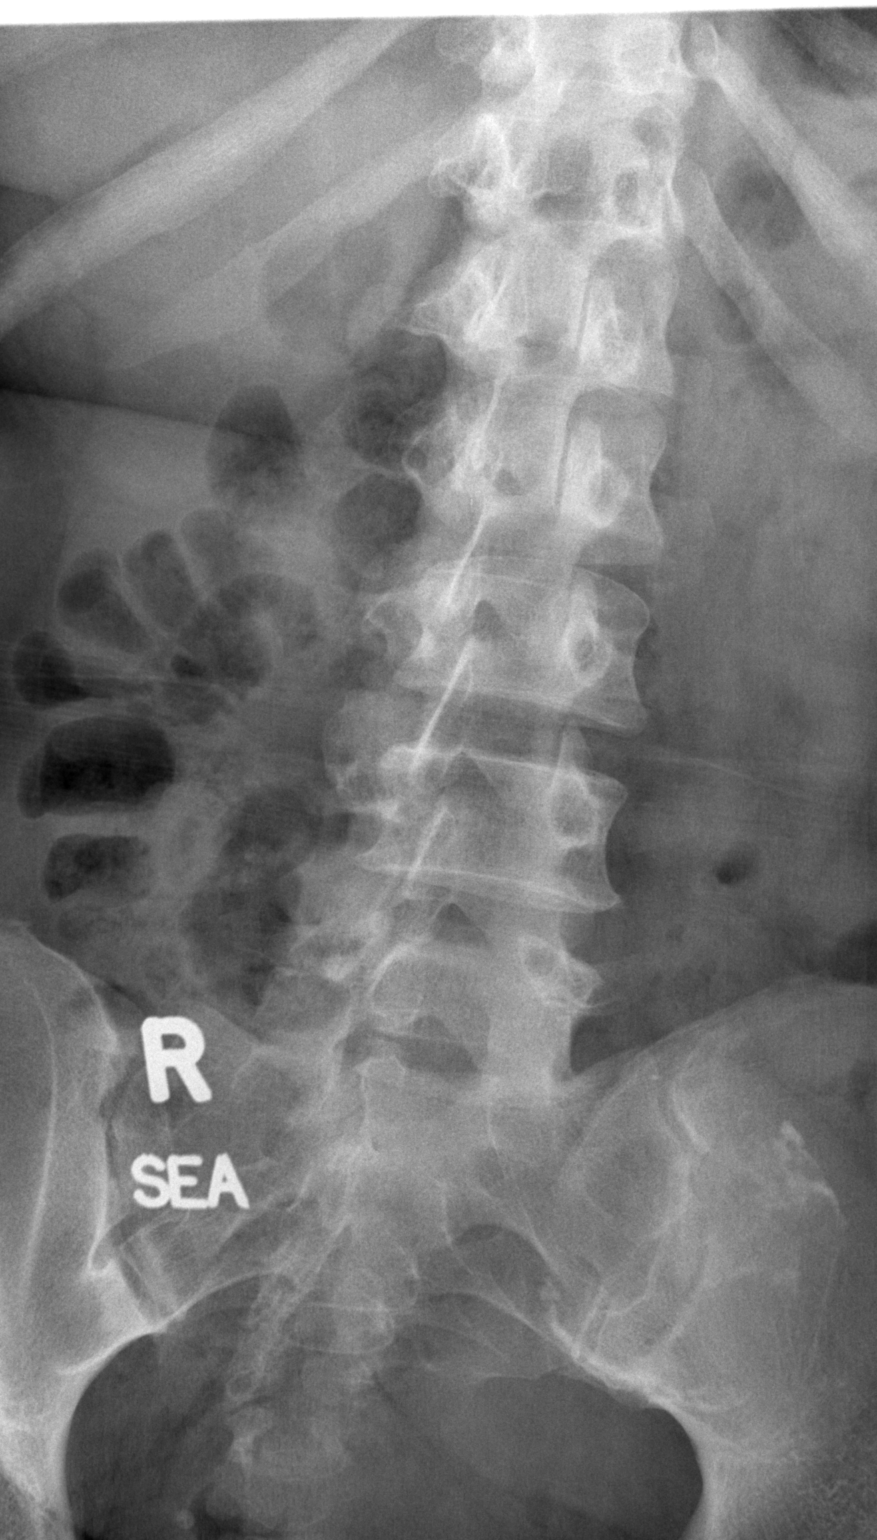

[3 of 3 positions shown; findings below may reference images not displayed]

FINDINGS: The coarse calcification overlying the left pelvis may
represent a calcified uterine fibroid. Negative for fracture,
dislocation, or other acute abnormality.  Normal alignment and
mineralization. No significant degenerative change.  Regional soft
tissues unremarkable.
IMPRESSION: Negative

## 2010-07-22 IMAGING — CT CT PELVIS W/O CM
3 series · 17 of 46 positions shown, 20 images · non-contrast
Comparison: None

CLINICAL DATA: right hip pain

CT PELVIS WITHOUT CONTRAST
TECHNIQUE: Multidetector CT imaging of the pelvis was performed
following the standard protocol without intravenous contrast.

[Series 3: bonypelvis 5.0 b31f · axial · 0.77mm/px · z∈[-289,-74]mm · 13 of 49 slices shown, 16 images]
[im 4/49  soft-tissue]
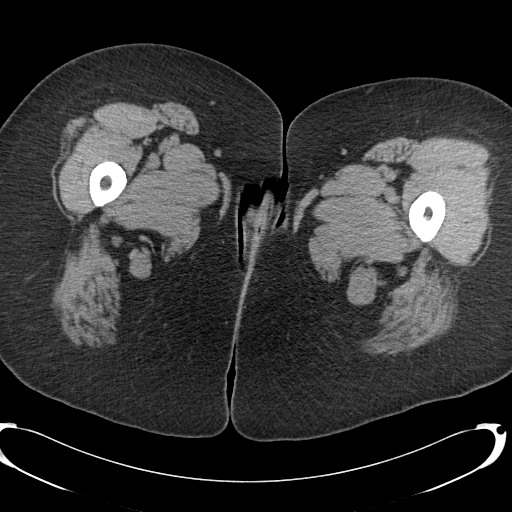
[im 4/49  bone]
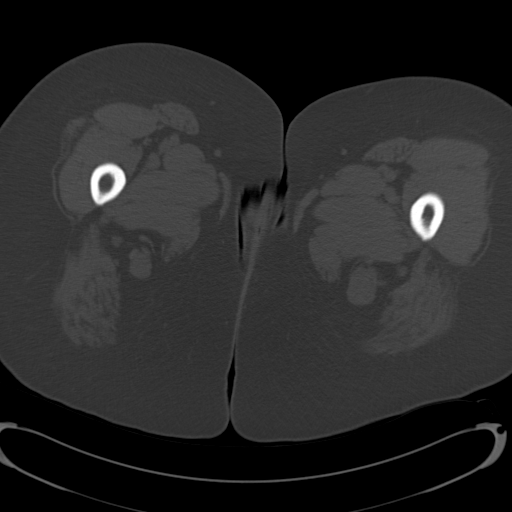
[im 8/49  soft-tissue]
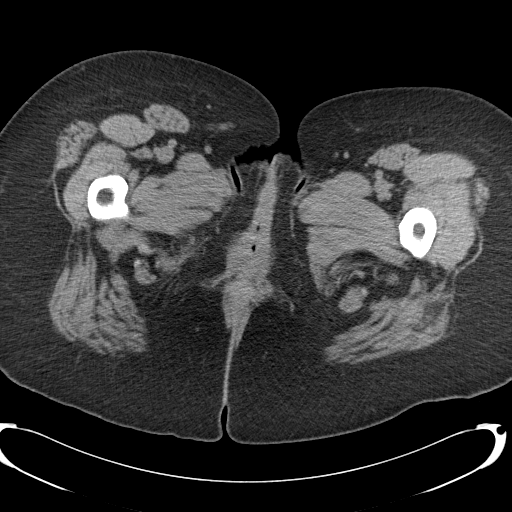
[im 13/49  soft-tissue]
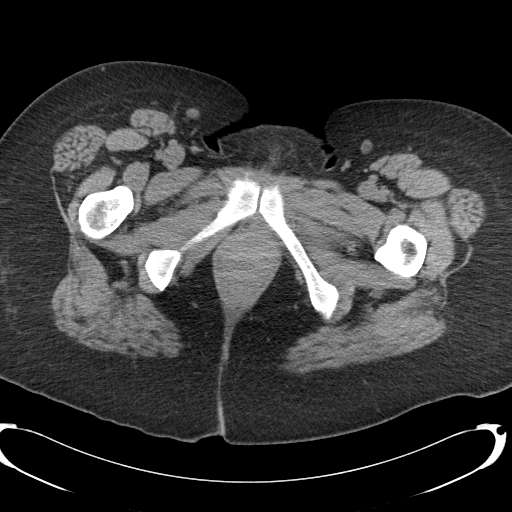
[im 18/49  soft-tissue]
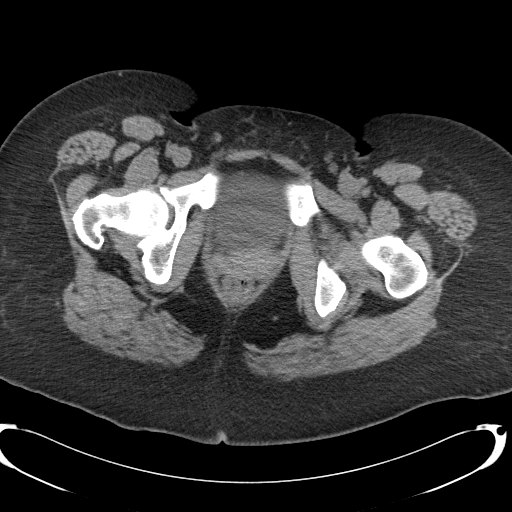
[im 22/49  soft-tissue]
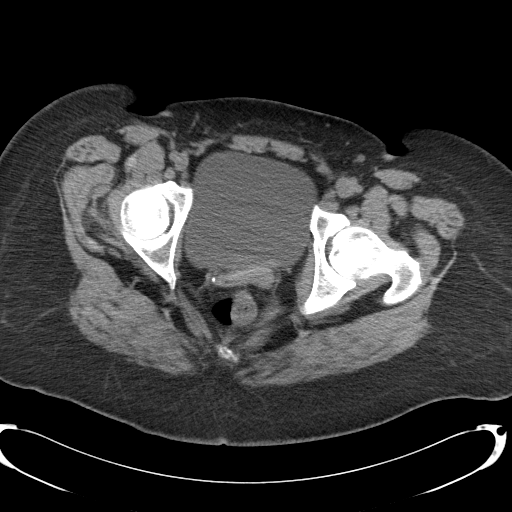
[im 27/49  soft-tissue]
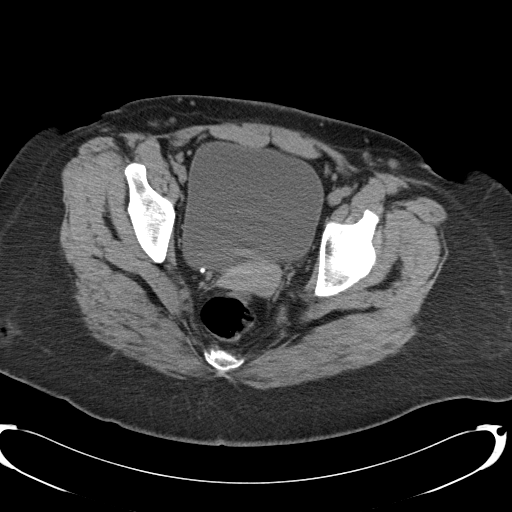
[im 31/49  soft-tissue]
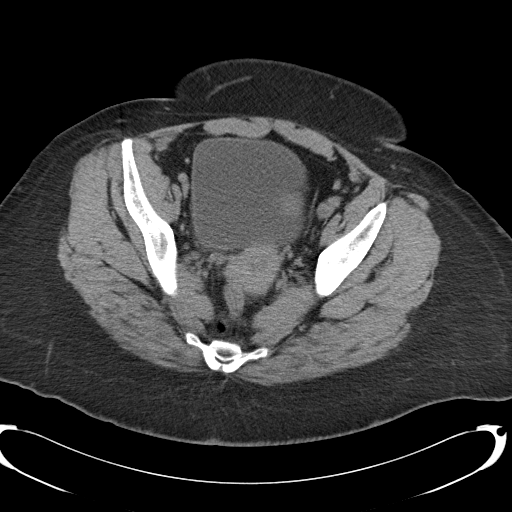
[im 36/49  soft-tissue]
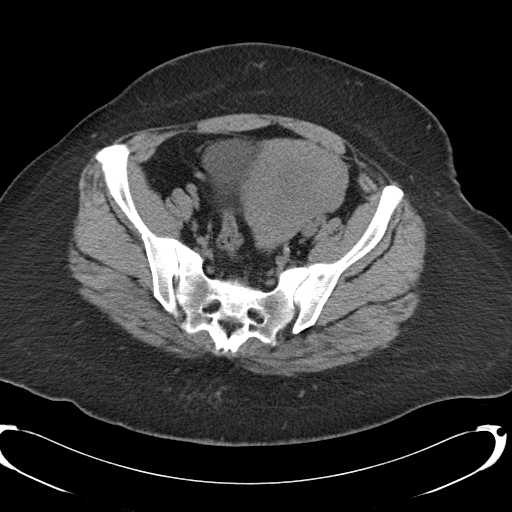
[im 41/49  soft-tissue]
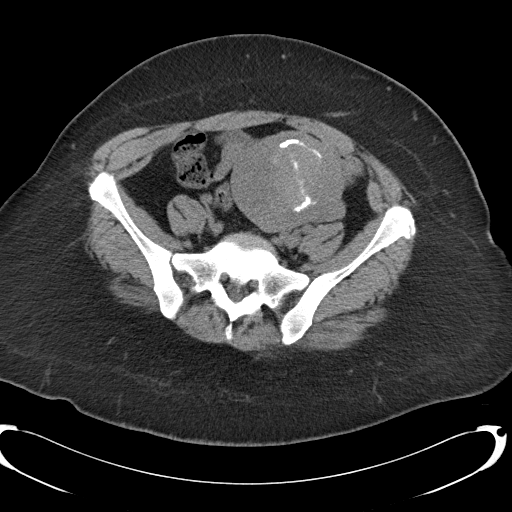
[im 41/49  bone]
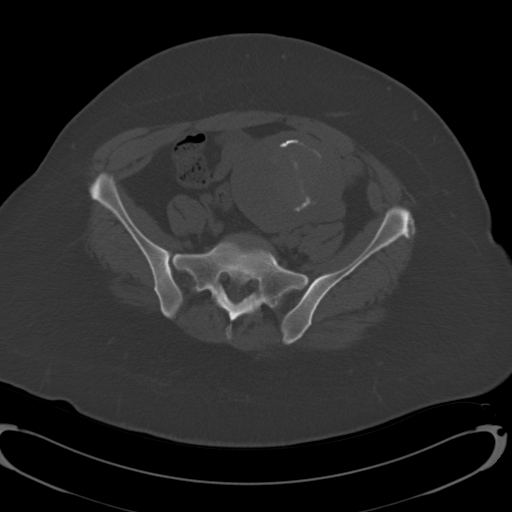
[im 42/49  lung]
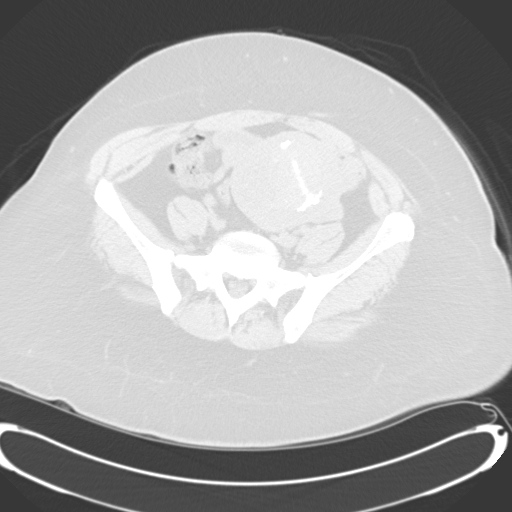
[im 44/49  lung]
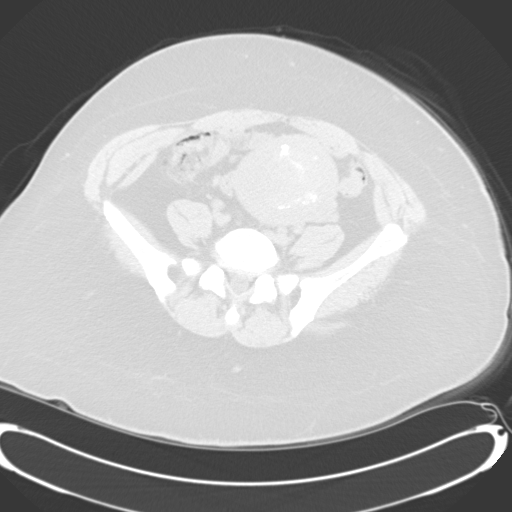
[im 45/49  soft-tissue]
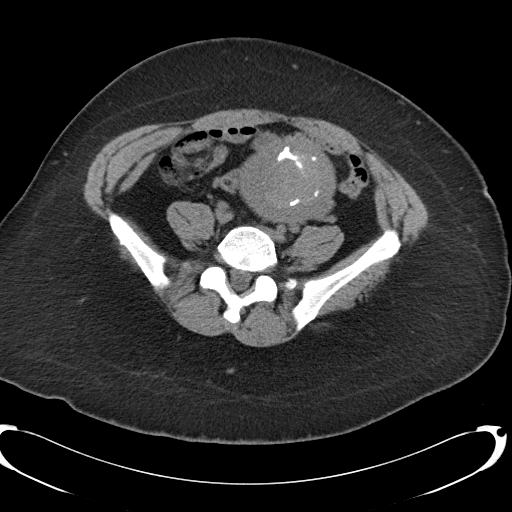
[im 45/49  lung]
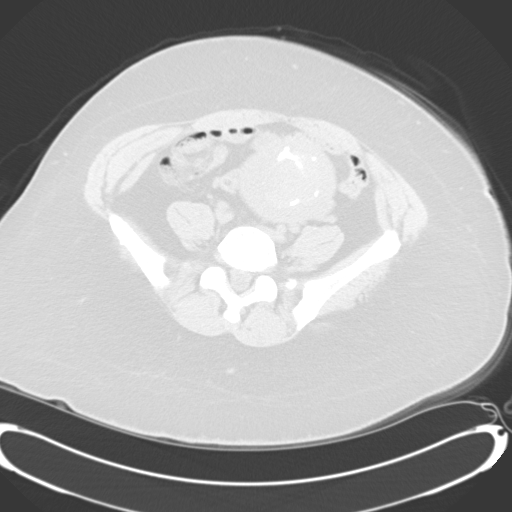
[im 47/49  lung]
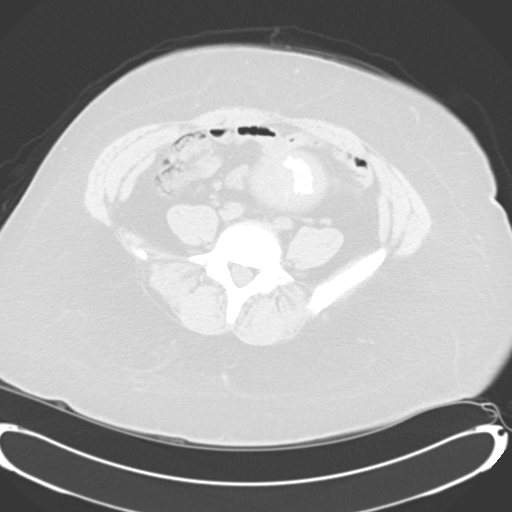

[Series 602: cor · coronal · 0.77mm/px · 3 of 89 slices shown]
[im 30/89  soft-tissue]
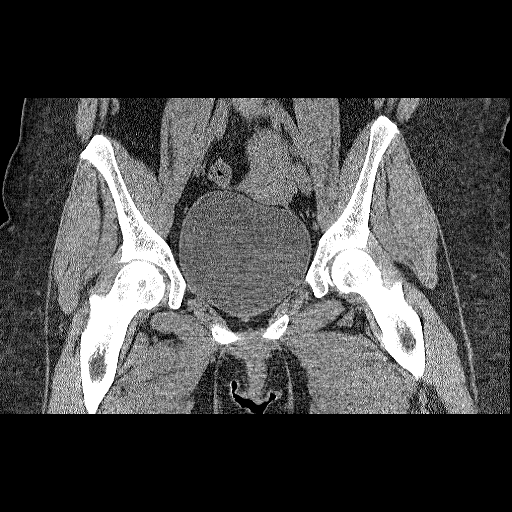
[im 40/89  soft-tissue]
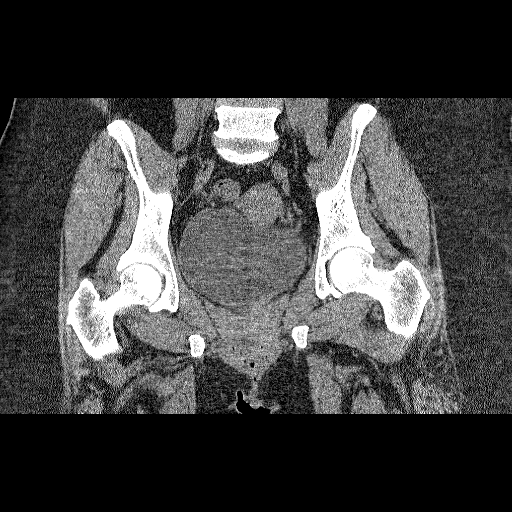
[im 49/89  soft-tissue]
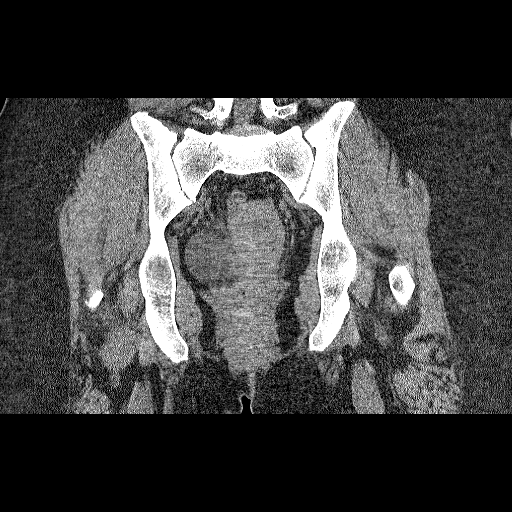

[Series 603: sag · sagittal · 0.77mm/px · 1 of 144 slices shown]
[im 48/144  soft-tissue]
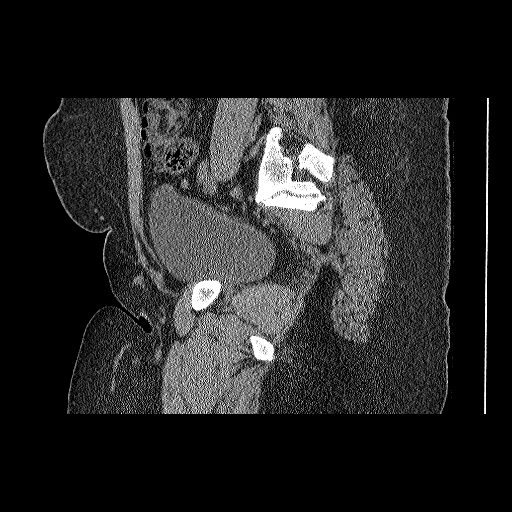

[17 of 46 positions shown; findings below may reference images not displayed]

FINDINGS: Negative for fracture.  There is a small subchondral cyst
or geode in the right femoral head.  No other significant
degenerative changes.  There is a peripherally calcified uterine
fibroid.  Urinary bladder is physiologically distended.  No free
fluid.  Regional soft tissues unremarkable.
IMPRESSION: 1.  Negative for fracture or other acute bone injury.
2.  Calcified uterine fibroid

## 2010-07-23 ENCOUNTER — Ambulatory Visit: Payer: Medicare Other | Admitting: *Deleted

## 2010-07-25 ENCOUNTER — Telehealth: Payer: Self-pay

## 2010-07-25 ENCOUNTER — Encounter: Payer: Self-pay | Admitting: Internal Medicine

## 2010-07-25 ENCOUNTER — Ambulatory Visit (INDEPENDENT_AMBULATORY_CARE_PROVIDER_SITE_OTHER): Payer: Medicare Other | Admitting: Internal Medicine

## 2010-07-25 VITALS — BP 120/80 | HR 80 | Temp 97.9°F | Resp 16 | Wt 280.0 lb

## 2010-07-25 DIAGNOSIS — I10 Essential (primary) hypertension: Secondary | ICD-10-CM

## 2010-07-25 DIAGNOSIS — F319 Bipolar disorder, unspecified: Secondary | ICD-10-CM

## 2010-07-25 DIAGNOSIS — Z720 Tobacco use: Secondary | ICD-10-CM

## 2010-07-25 DIAGNOSIS — J309 Allergic rhinitis, unspecified: Secondary | ICD-10-CM

## 2010-07-25 DIAGNOSIS — F17200 Nicotine dependence, unspecified, uncomplicated: Secondary | ICD-10-CM

## 2010-07-25 MED ORDER — CLONAZEPAM 1 MG PO TABS: 1.0000 mg | ORAL_TABLET | Freq: Three times a day (TID) | ORAL | Status: DC

## 2010-07-25 MED ORDER — MOMETASONE FUROATE 50 MCG/ACT NA SUSP: 2.0000 | Freq: Every day | NASAL | Status: DC

## 2010-07-25 MED ORDER — QUETIAPINE FUMARATE 25 MG PO TABS: 50.0000 mg | ORAL_TABLET | Freq: Every day | ORAL | Status: DC

## 2010-07-25 MED ORDER — GABAPENTIN 400 MG PO CAPS: 400.0000 mg | ORAL_CAPSULE | Freq: Every day | ORAL | Status: DC

## 2010-07-25 NOTE — Patient Instructions (Signed)
Allergic Rhinitis Allergic rhinitis is when the mucous membranes in the nose respond to allergens. Allergens are particles in the air that cause your body to have an allergic reaction. This causes you to release allergic antibodies. Through a chain of events, these eventually cause you to release histamine into the blood stream (hence the use of antihistamines). Although meant to be protective to the body, it is this release that causes your discomfort, such as frequent sneezing, congestion and an itchy runny nose.  CAUSES The pollen allergens may come from grasses, trees, and weeds. This is seasonal allergic rhinitis, or "hay fever." Other allergens cause year-round allergic rhinitis (perennial allergic rhinitis) such as house dust mite allergen, pet dander and mold spores.  SYMPTOMS  Nasal stuffiness (congestion).   Runny, itchy nose with sneezing and tearing of the eyes.   There is often an itching of the mouth, eyes and ears.  It cannot be cured, but it can be controlled with medications. DIAGNOSIS If you are unable to determine the offending allergen, skin or blood testing may find it. TREATMENT  Avoid the allergen.   Medications and allergy shots (immunotherapy) can help.   Hay fever may often be treated with antihistamines in pill or nasal spray forms. Antihistamines block the effects of histamine. There are over-the-counter medicines that may help with nasal congestion and swelling around the eyes. Check with your caregiver before taking or giving this medicine.  If the treatment above does not work, there are many new medications your caregiver can prescribe. Stronger medications may be used if initial measures are ineffective. Desensitizing injections can be used if medications and avoidance fails. Desensitization is when a patient is given ongoing shots until the body becomes less sensitive to the allergen. Make sure you follow up with your caregiver if problems continue. SEEK  MEDICAL CARE IF:   You develop fever (more than 100.5F (38.1 C).   You develop a cough that does not stop easily (persistent).   You have shortness of breath.   You start wheezing.   Symptoms interfere with normal daily activities.  Document Released: 11/04/2000 Document Re-Released: 03/03/2009 Exit[employer] Patient Information 2011 [employer]. 

## 2010-07-25 NOTE — Assessment & Plan Note (Signed)
Her BP is well controlled 

## 2010-07-25 NOTE — Assessment & Plan Note (Signed)
She is actively quitting tobacco abuse by using chantix

## 2010-07-25 NOTE — Telephone Encounter (Signed)
abstract

## 2010-07-25 NOTE — Assessment & Plan Note (Signed)
Start nasonex ns 

## 2010-07-25 NOTE — Assessment & Plan Note (Signed)
She is doing well, her meds were refilled for her

## 2010-07-25 NOTE — Progress Notes (Signed)
Subjective:    Patient ID: Shannon [name], female    DOB: 1965/12/02, 44 y.o.   MRN: [id number]  Allergic Reaction This is a recurrent problem. The current episode started more than 1 week ago. The problem occurs intermittently. The problem has been gradually worsening since onset. The problem is mild. It is unknown what she was exposed to. The time of exposure is unknown. Associated symptoms include eye itching and eye watering. Pertinent negatives include no abdominal pain, chest pain, chest pressure, coughing, diarrhea, difficulty breathing, drooling, eye redness, globus sensation, hyperventilation, itching, rash, stridor, trouble swallowing, vomiting or wheezing. There is no swelling present. Past treatments include nothing. Her past medical history is significant for seasonal allergies. There is no history of asthma, atopic dermatitis, food allergies or medication allergies.      Review of Systems  HENT: Positive for ear pain, congestion, rhinorrhea, sneezing and postnasal drip. Negative for hearing loss, nosebleeds, sore throat, facial swelling, drooling, mouth sores, trouble swallowing, neck pain, neck stiffness, dental problem, voice change, sinus pressure, tinnitus and ear discharge.   Eyes: Positive for itching. Negative for photophobia, pain, discharge, redness and visual disturbance.  Respiratory: Negative for apnea, cough, chest tightness, shortness of breath, wheezing and stridor.   Cardiovascular: Negative for chest pain, palpitations and leg swelling.  Gastrointestinal: Negative for nausea, vomiting, abdominal pain, diarrhea, constipation and blood in stool.  Genitourinary: Negative for dysuria, urgency, frequency, hematuria, flank pain, decreased urine volume, enuresis, difficulty urinating and dyspareunia.  Skin: Negative for itching and rash.  Neurological: Negative for dizziness, tremors, seizures, syncope, facial asymmetry, speech difficulty, weakness, light-headedness,  numbness and headaches.  Hematological: Negative for adenopathy. Does not bruise/bleed easily.  Psychiatric/Behavioral: Negative for suicidal ideas, hallucinations, behavioral problems, confusion, sleep disturbance, self-injury, dysphoric mood, decreased concentration and agitation. The patient is nervous/anxious. The patient is not hyperactive.        Objective:   Physical Exam  Constitutional: She is oriented to person, place, and time. She appears well-developed and well-nourished. No distress.  HENT:  Head: Normocephalic and atraumatic. No trismus in the jaw.  Right Ear: Hearing, tympanic membrane, external ear and ear canal normal. No lacerations. No drainage, swelling or tenderness. No foreign bodies. No mastoid tenderness. Tympanic membrane is not injected, not scarred, not perforated, not erythematous, not retracted and not bulging. Tympanic membrane mobility is normal. No middle ear effusion. No hemotympanum. No decreased hearing is noted.  Left Ear: Hearing, tympanic membrane, external ear and ear canal normal. No lacerations. No drainage, swelling or tenderness. No foreign bodies. No mastoid tenderness. Tympanic membrane is not injected, not scarred, not perforated, not erythematous, not retracted and not bulging. Tympanic membrane mobility is normal.  No middle ear effusion. No hemotympanum. No decreased hearing is noted.  Nose: Mucosal edema present. No rhinorrhea, nose lacerations, sinus tenderness, nasal deformity, septal deviation or nasal septal hematoma. No epistaxis.  No foreign bodies. Right sinus exhibits no maxillary sinus tenderness and no frontal sinus tenderness. Left sinus exhibits no maxillary sinus tenderness and no frontal sinus tenderness.  Mouth/Throat: Uvula is midline, oropharynx is clear and moist and mucous membranes are normal. Mucous membranes are not pale, not dry and not cyanotic. Normal dentition. No uvula swelling. No oropharyngeal exudate, posterior  oropharyngeal edema, posterior oropharyngeal erythema or tonsillar abscesses.  Eyes: Conjunctivae and EOM are normal. Pupils are equal, round, and reactive to light. Right eye exhibits no discharge. Left eye exhibits no discharge. No scleral icterus.  Neck: Normal range of motion. Neck  supple. No JVD present. No tracheal deviation present. No thyromegaly present.  Cardiovascular: Normal rate, regular rhythm, normal heart sounds and intact distal pulses.  Exam reveals no gallop and no friction rub.   No murmur heard. Pulmonary/Chest: Effort normal and breath sounds normal. No stridor. No respiratory distress. She has no wheezes. She has no rales. She exhibits no tenderness.  Abdominal: Soft. Bowel sounds are normal. She exhibits no distension and no mass. There is no tenderness. There is no rebound and no guarding.  Musculoskeletal: Normal range of motion. She exhibits no edema and no tenderness.  Lymphadenopathy:    She has no cervical adenopathy.  Neurological: She is alert and oriented to person, place, and time. She has normal reflexes. No cranial nerve deficit. She exhibits normal muscle tone. Coordination normal.  Skin: Skin is warm and dry. No rash noted. She is not diaphoretic. No erythema. No pallor.  Psychiatric: She has a normal mood and affect. Her behavior is normal. Judgment and thought content normal.        Lab Results  Component Value Date   WBC 9.9 07/15/2010   HGB 13.0 07/15/2010   HCT 39.4 07/15/2010   PLT 293 07/15/2010   CHOL 244* 11/06/2009   TRIG 151.0* 11/06/2009   HDL 36.10* 11/06/2009   LDLDIRECT 184.8 11/06/2009   ALT 19 07/10/2010   AST 15 07/10/2010   NA 138 07/10/2010   K 4.0 07/10/2010   CL 104 07/10/2010   CREATININE 0.53 07/10/2010   BUN 8 07/10/2010   CO2 26 07/10/2010   TSH 1.90 11/06/2009   HGBA1C 5.7 11/06/2009    Assessment & Plan:

## 2010-08-13 ENCOUNTER — Encounter (INDEPENDENT_AMBULATORY_CARE_PROVIDER_SITE_OTHER): Payer: Self-pay | Admitting: Surgery

## 2010-09-03 ENCOUNTER — Encounter (INDEPENDENT_AMBULATORY_CARE_PROVIDER_SITE_OTHER): Payer: Self-pay | Admitting: Surgery

## 2010-09-03 ENCOUNTER — Encounter: Payer: Self-pay | Admitting: *Deleted

## 2010-09-03 ENCOUNTER — Encounter: Payer: Medicare Other | Attending: Surgery | Admitting: *Deleted

## 2010-09-03 VITALS — Ht 64.0 in | Wt 272.4 lb

## 2010-09-03 DIAGNOSIS — Z9884 Bariatric surgery status: Secondary | ICD-10-CM

## 2010-09-03 DIAGNOSIS — Z01818 Encounter for other preprocedural examination: Secondary | ICD-10-CM | POA: Insufficient documentation

## 2010-09-03 DIAGNOSIS — Z713 Dietary counseling and surveillance: Secondary | ICD-10-CM | POA: Insufficient documentation

## 2010-09-03 HISTORY — DX: Bariatric surgery status: Z98.84

## 2010-09-03 NOTE — Progress Notes (Signed)
  Follow-up visit: 8 Weeks Post-Operative LAGB Surgery  Medical Nutrition Therapy:  Appt start time: 1500 end time:  1530.  Assessment:  Primary concerns today: post-operative bariatric surgery nutrition management. Due having to reschedule multiple appointments, this is the pts first post-op nutrition f/u visit. Pt has been following what she believes to be, a high protein diet yet has incorporated multiple new food items to diet. She notes large portions and an increasing appetite in the past week. Pt to see her surgeon tomorrow for possible band fill.  Weight today: 272.4 lbs (Start weight at [facility]: 292.4 lbs) Weight change: 20lbs (since surgery) Total weight lost: (20lbs) total BMI: 46.9%  Typical Day Recall:  B (9 AM): whole grain cereal (3/4 cup) with soy milk (1 cup) Snk (AM): None   L (1-2 PM): Fish fillet (3-6oz) OR Hot Dog franks with bread(2) Snk (4 PM): whole grain cereal (3/4 cup) with soy milk (1 cup)  D (6-8 PM): Hot Dog franks with bread(1) Snk (9 PM): whole grain cereal (3/4 cup) with soy milk (1 cup) OR ice cream cups (2)  Fluid intake: water, Crystal Light, soy milk = 64 oz  Estimated total protein intake: 50g  Medications: Clonazepam, Seroquel, Gabapentin Supplementation: Multivitamin (daily), Calcium citrate (1000-1500mg )  Using straws: No Drinking while eating: YES (directly after eating >20oz) Hair loss: No  Carbonated beverages: Yes (pt drinking [place]) N/V/D/C: Constipation (using a "natural laxative") Dumping syndrome: N/A Last Lap-Band fill: Scheduled to see surgeon tomorrow  Recent physical activity: Exercising at the gym [employer], weights), 3 times per week, 60-90 minutes  Progress Towards Goal(s):  In progress.   Nutritional Diagnosis:  Inadequate protein intake related to smaller portions of protein rich foods, limited protein supplements, and large portions of starchy foods as evidenced by pt consuming <50g protein per day.    Intervention:     Advance diet to Phase 3B: High Protein + NS Vegetables  Eat protein-rich food FIRST, limit starchy foods  Increase protein to goal (80g)  Avoid drinking 15 minutes before, during, and 30 minutes after meals/snacks  Avoid all carbonated beverages  Continue regular physical activity  Follow up with your surgeon on Band Fills  Monitoring/Evaluation:  Dietary intake, exercise, lap band fills, and body weight. Follow up in 6 weeks for 3-4 month post-op visit.

## 2010-09-03 NOTE — Patient Instructions (Addendum)
   Advance diet to Phase 3B: High Protein + NS Vegetables  Eat protein-rich food FIRST, limit starchy foods  Increase protein to goal (80g)  Avoid drinking 15 minutes before, during, and 30 minutes after meals/snacks  Avoid all carbonated beverages  Continue regular physical activity  Follow up with your surgeon on Band Fills

## 2010-09-04 ENCOUNTER — Ambulatory Visit (INDEPENDENT_AMBULATORY_CARE_PROVIDER_SITE_OTHER): Payer: Medicare Other | Admitting: Surgery

## 2010-09-04 ENCOUNTER — Encounter (INDEPENDENT_AMBULATORY_CARE_PROVIDER_SITE_OTHER): Payer: Self-pay | Admitting: Surgery

## 2010-09-04 DIAGNOSIS — Z4651 Encounter for fitting and adjustment of gastric lap band: Secondary | ICD-10-CM

## 2010-09-04 NOTE — Patient Instructions (Addendum)
Stay on clear liquids for 2 days, then restart regular diet slowly.  Keep up the exercise.  This is hard work, very hard.  But you are doing well, keep it up.

## 2010-09-04 NOTE — Progress Notes (Signed)
The patient is a 45 year old black female sees Dr. Sanda Linger the medical standpoint and Dr. Lavina Hamman from a GYN standpoint.  I placed an AP large lap band on her on 14 Jul 2010.  Patient's initial weight was 291 pounds and her BMI 51.5. Her current weight is 271 pounds and her BMI is 48.  She somewhat frustrated because how difficult losing weight he is. She often compares herself to her girlfriend who has a lap band and seems like she is losing weight easily. The patient admits to needing bread though she is trying to correct that behavior. She has met with our nutritionist to discuss methods of improving her diet.  She is swimming 3 laps twice a week. I've encouraged her to try to increase her laps bowel one per month for a total of 15 laps each exercise.  I have encourgaed her to go to the LAP-BAND website at least once a week to review their information.  Physical exam: Abdomen: Her incisions for laparoscopic surgery are well healed. Her band port tilt towards her feet.  LAP-BAND adjustment: She had about 3 cc of fluid in her band and I added 2 more cc for a total of 5 cc in her band. She's a stay on clear liquids for 2 days, then go slowly on solid foods.  Diagnoses include: #1. AP large lap band.  We had a discussion about how hard it is to lose weight, even with the lap band.    She is frustrated and shows a lack of understanding about the band and how it works.  But she is trying hard with her exercise.  And I tried to impress on her that weight loss is difficult, even with bariatric surgery.  The lap band is only a tool to assist in weight loss, the patient stills has to do the work.  This is a repeat of information that she was given preoperatively.  She needs to continue her diet, continue paying attention to her portion size, and continue exercising.  Weight loss (with or without surgery is difficult (or else everyone would be thin).  #2. Bipolar disease. Followed by  [facility] mental health. #3. Hypertension. #4. Quit smoking, though having trouble with this. #5. Chronic back pain.

## 2010-09-09 ENCOUNTER — Encounter (INDEPENDENT_AMBULATORY_CARE_PROVIDER_SITE_OTHER): Payer: Self-pay | Admitting: Surgery

## 2010-09-24 ENCOUNTER — Encounter (INDEPENDENT_AMBULATORY_CARE_PROVIDER_SITE_OTHER): Payer: Self-pay | Admitting: Surgery

## 2010-10-15 ENCOUNTER — Ambulatory Visit: Payer: Medicare Other | Admitting: *Deleted

## 2010-10-20 ENCOUNTER — Encounter (INDEPENDENT_AMBULATORY_CARE_PROVIDER_SITE_OTHER): Payer: Self-pay

## 2010-10-21 ENCOUNTER — Encounter: Payer: Self-pay | Admitting: *Deleted

## 2010-10-21 ENCOUNTER — Encounter: Payer: Medicare Other | Attending: Surgery | Admitting: *Deleted

## 2010-10-21 DIAGNOSIS — Z01818 Encounter for other preprocedural examination: Secondary | ICD-10-CM | POA: Insufficient documentation

## 2010-10-21 DIAGNOSIS — Z713 Dietary counseling and surveillance: Secondary | ICD-10-CM | POA: Insufficient documentation

## 2010-10-21 DIAGNOSIS — Z9884 Bariatric surgery status: Secondary | ICD-10-CM

## 2010-10-21 NOTE — Patient Instructions (Addendum)
Goals:  Follow Phase 3B: High Protein + Non-Starchy Vegetables  Eat 3-6 small meals/snacks, every 3-5 hrs  Increase lean protein foods to meet 60-80g goal  Increase fluid intake to 64oz +  Add 15 grams of carbohydrate (fruit, whole grain, starchy vegetable) with meals  Avoid drinking 15 minutes before, during and 30 minutes after eating  Aim for >30 min of physical activity daily  Avoid all concentrated sweets

## 2010-10-21 NOTE — Progress Notes (Signed)
  Follow-up visit: 16 Weeks Post-Operative LAGB Surgery   Medical Nutrition Therapy: Appt start time: 0835 end time: 0905.   Assessment: Primary concerns today: post-operative bariatric surgery nutrition management. Shannon [name] is extremely frustrated today. She is mad with the fact that her lap band is not doing the job it is supposed to do. She feels "no restriction at all". She feels like the weight she has lost can be attributed to exercise. She reports several episodes of binge eating (half gallon of ice cream w/ oreo cookies crushed in it) related to stress. Pt may need referral back to psychologist for emotional/binge eating. Pt states that "a part of me is frustrated and part of me has given up on this weight loss".  Weight today: 264.4 lbs  (Start weight at [facility]: 292.4 lbs)  Weight change: 8 lbs (since last visit)  Total weight lost: 28 lbs total  BMI: 45.4%  Typical Day Recall:  B (11 AM): sausages (2), 2 pancakes w/ syrup Snk (AM): None L (1-2 PM): Skips Snk (4 PM): Piece of cake or Little Debbie treat *NAP D (6-8 PM): 2 slices of Pizza, 1 deviled egg Snk (11 PM): Piece of cake   Fluid intake: water, Crystal Light, soy milk = 64 oz  Estimated total protein intake: 30-45g   Medications: Clonazepam, Seroquel, Gabapentin (no changes) Supplementation: Multivitamin (daily), Calcium citrate (1000-1500mg )   Using straws: No  Drinking while eating: YES (directly after eating 20oz)  Hair loss: No  Carbonated beverages: Yes (pt drinking [place])  N/V/D/C: Constipation (using a "natural laxative")  Last Lap-Band fill: Only had one fill; To see Dr. Ezzard Standing Friday (8/31) Recent physical activity: No exercise reported due to recent moving  Progress Towards Goal(s): In progress.   Nutritional Diagnosis:  Inadequate protein intake related to smaller portions of protein rich foods, limited protein supplements, and large portions of starchy food/concentrated sweets as evidenced by pt  consuming <50g protein per day.   Excessive carbohydrate intake related to binge eating as evidenced by pt consuming 3-8 servings of concentrated sweets, cakes, desserts, or ice cream per day (>150-200g carb/day).  Intervention:   Follow Phase 3B: High Protein + Non-Starchy Vegetables  Eat 3-6 small meals/snacks, every 3-5 hrs  Increase lean protein foods to meet 60-80g goal  Increase fluid intake to 64oz +  Add 15 grams of carbohydrate (fruit, whole grain, starchy vegetable) with meals  Avoid drinking 15 minutes before, during and 30 minutes after eating  Aim for >30 min of physical activity daily  Avoid all concentrated sweets   Monitoring/Evaluation: Dietary intake, exercise, lap band fills, and body weight. Follow up in 6 weeks for 3 month post-op visit.

## 2010-10-24 ENCOUNTER — Ambulatory Visit (INDEPENDENT_AMBULATORY_CARE_PROVIDER_SITE_OTHER): Payer: Medicare Other | Admitting: Physician Assistant

## 2010-10-24 ENCOUNTER — Encounter (INDEPENDENT_AMBULATORY_CARE_PROVIDER_SITE_OTHER): Payer: Self-pay

## 2010-10-24 VITALS — BP 124/86 | HR 84 | Ht 63.0 in | Wt 263.0 lb

## 2010-10-24 DIAGNOSIS — Z4651 Encounter for fitting and adjustment of gastric lap band: Secondary | ICD-10-CM

## 2010-10-24 NOTE — Progress Notes (Signed)
  HISTORY: Shannon [name] is a 45 y.o.female who received an AP-Large lap-band in May 2012 by Dr. Ezzard Standing. She comes in having been seen by Dr. Ezzard Standing last month. She seems disappointed despite having lost just over 8 lbs. She complains of persistent hunger despite the lap band and appears somewhat frustrated.  VITAL SIGNS: Filed Vitals:   10/24/10 1540  BP: 124/86  Pulse: 84    PHYSICAL EXAM: Physical exam reveals a very well-appearing 45 y.o.female in no apparent distress Neurologic: Awake, alert, oriented Psych: Bright affect, conversant Respiratory: Breathing even and unlabored. No stridor or wheezing Abdomen: Soft, nontender, nondistended to palpation. Incisions well-healed. No incisional hernias. Port easily palpated. Extremities: Atraumatic, good range of motion.  ASSESMENT: 45 y.o.  female  s/p AP-Large lap-band.   PLAN: The patient's port was accessed with a 20G Huber needle without difficulty. Clear fluid was aspirated and 1.0 mL saline was added to the port. The patient was able to swallow water without difficulty following the procedure and was instructed to take clear liquids for the next 24-48 hours and advance slowly as tolerated.

## 2010-10-24 NOTE — Patient Instructions (Signed)
Take clear liquids for the next 48 hours. Thin protein shakes are ok to start on Saturday evening. Call us if you have persistent vomiting or regurgitation, night cough or reflux symptoms. Return as scheduled or sooner if you notice no changes in hunger/portion sizes.   

## 2010-11-08 ENCOUNTER — Other Ambulatory Visit: Payer: Self-pay | Admitting: Internal Medicine

## 2010-11-10 ENCOUNTER — Telehealth: Payer: Self-pay

## 2010-11-10 NOTE — Telephone Encounter (Signed)
Duplicate request. Patient was given refills on 07/25/10 #[id number]. Pharmacy notified too soon

## 2010-11-21 ENCOUNTER — Encounter (INDEPENDENT_AMBULATORY_CARE_PROVIDER_SITE_OTHER): Payer: Medicare Other

## 2010-11-28 ENCOUNTER — Ambulatory Visit (INDEPENDENT_AMBULATORY_CARE_PROVIDER_SITE_OTHER): Payer: Medicare Other | Admitting: Physician Assistant

## 2010-11-28 ENCOUNTER — Encounter (INDEPENDENT_AMBULATORY_CARE_PROVIDER_SITE_OTHER): Payer: Self-pay

## 2010-11-28 VITALS — BP 132/88 | HR 84 | Resp 16 | Ht 63.0 in | Wt 264.4 lb

## 2010-11-28 DIAGNOSIS — Z4651 Encounter for fitting and adjustment of gastric lap band: Secondary | ICD-10-CM

## 2010-11-28 NOTE — Progress Notes (Signed)
  HISTORY: BERNESE DOFFING is a 45 y.o.female who received an AP-Large lap-band in May 2012 by Dr. Ezzard Standing. She comes in having gained 1.4 lbs since her last visit 1 month ago. She says she felt little difference in her hunger or portion sizes since then. She denies vomiting or regurgitation.  VITAL SIGNS: Filed Vitals:   11/28/10 1112  BP: 132/88  Pulse: 84  Resp: 16    PHYSICAL EXAM: Physical exam reveals a very well-appearing 45 y.o.female in no apparent distress Neurologic: Awake, alert, oriented Psych: Flat affect, conversant Respiratory: Breathing even and unlabored. No stridor or wheezing Abdomen: Soft, nontender, nondistended to palpation. Incisions well-healed. No incisional hernias. Port easily palpated. Extremities: Atraumatic, good range of motion.  ASSESMENT: 45 y.o.  female  s/p AP-Large lap-band.   PLAN: The patient's port was accessed with a 20G Huber needle without difficulty. Clear fluid was aspirated and 0.5 mL saline was added to the port to give a total predicted volume of 6.5 mL. The patient was able to swallow water without difficulty following the procedure and was instructed to take clear liquids for the next 24-48 hours and advance slowly as tolerated.

## 2010-11-28 NOTE — Patient Instructions (Signed)
Take clear liquids for the next 48 hours. Thin protein shakes are ok to start on Saturday evening. Call us if you have persistent vomiting or regurgitation, night cough or reflux symptoms. Return as scheduled or sooner if you notice no changes in hunger/portion sizes.   

## 2010-12-09 ENCOUNTER — Telehealth: Payer: Self-pay | Admitting: *Deleted

## 2010-12-09 MED ORDER — QUETIAPINE FUMARATE ER 300 MG PO TB24: 300.0000 mg | ORAL_TABLET | Freq: Every day | ORAL | Status: DC

## 2010-12-09 NOTE — Telephone Encounter (Signed)
Pt is requesting refill on seroquel. Please Advise refills

## 2010-12-09 NOTE — Telephone Encounter (Signed)
ok 

## 2011-01-01 ENCOUNTER — Encounter (INDEPENDENT_AMBULATORY_CARE_PROVIDER_SITE_OTHER): Payer: Self-pay | Admitting: Surgery

## 2011-01-02 ENCOUNTER — Encounter (INDEPENDENT_AMBULATORY_CARE_PROVIDER_SITE_OTHER): Payer: Medicare Other

## 2011-01-07 ENCOUNTER — Encounter (INDEPENDENT_AMBULATORY_CARE_PROVIDER_SITE_OTHER): Payer: Medicare Other | Admitting: Surgery

## 2011-02-06 ENCOUNTER — Encounter (INDEPENDENT_AMBULATORY_CARE_PROVIDER_SITE_OTHER): Payer: Medicare Other

## 2011-02-13 ENCOUNTER — Other Ambulatory Visit: Payer: Self-pay | Admitting: Internal Medicine

## 2011-02-13 ENCOUNTER — Telehealth: Payer: Self-pay

## 2011-02-13 NOTE — Telephone Encounter (Signed)
Received fax from pharmacy requesting refill on clonazepam tid. Please advise if ok

## 2011-02-13 NOTE — Telephone Encounter (Signed)
She need to be seen. 

## 2011-02-26 ENCOUNTER — Encounter (INDEPENDENT_AMBULATORY_CARE_PROVIDER_SITE_OTHER): Payer: Self-pay | Admitting: Surgery

## 2011-02-26 ENCOUNTER — Encounter: Payer: Self-pay | Admitting: Internal Medicine

## 2011-02-26 ENCOUNTER — Ambulatory Visit (INDEPENDENT_AMBULATORY_CARE_PROVIDER_SITE_OTHER): Payer: Medicare Other | Admitting: Internal Medicine

## 2011-02-26 ENCOUNTER — Other Ambulatory Visit (INDEPENDENT_AMBULATORY_CARE_PROVIDER_SITE_OTHER): Payer: Medicare Other

## 2011-02-26 ENCOUNTER — Other Ambulatory Visit: Payer: Self-pay | Admitting: Internal Medicine

## 2011-02-26 DIAGNOSIS — I10 Essential (primary) hypertension: Secondary | ICD-10-CM

## 2011-02-26 DIAGNOSIS — E785 Hyperlipidemia, unspecified: Secondary | ICD-10-CM

## 2011-02-26 DIAGNOSIS — Z1231 Encounter for screening mammogram for malignant neoplasm of breast: Secondary | ICD-10-CM

## 2011-02-26 DIAGNOSIS — D509 Iron deficiency anemia, unspecified: Secondary | ICD-10-CM

## 2011-02-26 DIAGNOSIS — F319 Bipolar disorder, unspecified: Secondary | ICD-10-CM

## 2011-02-26 DIAGNOSIS — Z9884 Bariatric surgery status: Secondary | ICD-10-CM

## 2011-02-26 LAB — CBC WITH DIFFERENTIAL/PLATELET
Basophils Absolute: 0.1 10*3/uL (ref 0.0–0.1)
Basophils Relative: 0.7 % (ref 0.0–3.0)
Eosinophils Absolute: 0.1 10*3/uL (ref 0.0–0.7)
Eosinophils Relative: 1.9 % (ref 0.0–5.0)
HCT: 42.5 % (ref 36.0–46.0)
Hemoglobin: 14.7 g/dL (ref 12.0–15.0)
Lymphocytes Relative: 27 % (ref 12.0–46.0)
Lymphs Abs: 2 10*3/uL (ref 0.7–4.0)
MCHC: 34.6 g/dL (ref 30.0–36.0)
MCV: 88.8 fl (ref 78.0–100.0)
Monocytes Absolute: 0.3 10*3/uL (ref 0.1–1.0)
Monocytes Relative: 4.5 % (ref 3.0–12.0)
Neutro Abs: 5 10*3/uL (ref 1.4–7.7)
Neutrophils Relative %: 65.9 % (ref 43.0–77.0)
Platelets: 266 10*3/uL (ref 150.0–400.0)
RBC: 4.78 Mil/uL (ref 3.87–5.11)
RDW: 15.4 % — ABNORMAL HIGH (ref 11.5–14.6)
WBC: 7.6 10*3/uL (ref 4.5–10.5)

## 2011-02-26 LAB — IBC PANEL
Iron: 106 ug/dL (ref 42–145)
Saturation Ratios: 28.3 % (ref 20.0–50.0)
Transferrin: 267.1 mg/dL (ref 212.0–360.0)

## 2011-02-26 LAB — COMPREHENSIVE METABOLIC PANEL
ALT: 23 U/L (ref 0–35)
AST: 19 U/L (ref 0–37)
Albumin: 3.5 g/dL (ref 3.5–5.2)
Alkaline Phosphatase: 89 U/L (ref 39–117)
BUN: 15 mg/dL (ref 6–23)
CO2: 25 mEq/L (ref 19–32)
Calcium: 8.4 mg/dL (ref 8.4–10.5)
Chloride: 106 mEq/L (ref 96–112)
Creatinine, Ser: 0.7 mg/dL (ref 0.4–1.2)
GFR: 111.82 mL/min (ref >60.00)
Glucose, Bld: 92 mg/dL (ref 70–99)
Potassium: 3.4 mEq/L — ABNORMAL LOW (ref 3.5–5.1)
Sodium: 139 mEq/L (ref 135–145)
Total Bilirubin: 0.8 mg/dL (ref 0.3–1.2)
Total Protein: 7.7 g/dL (ref 6.0–8.3)

## 2011-02-26 LAB — TSH: TSH: 4.04 u[IU]/mL (ref 0.35–5.50)

## 2011-02-26 LAB — LIPID PANEL
Cholesterol: 224 mg/dL — ABNORMAL HIGH (ref 0–200)
HDL: 57.2 mg/dL (ref >39.00)
Total CHOL/HDL Ratio: 4
Triglycerides: 260 mg/dL — ABNORMAL HIGH (ref 0.0–149.0)
VLDL: 52 mg/dL — ABNORMAL HIGH (ref 0.0–40.0)

## 2011-02-26 LAB — FERRITIN: Ferritin: 63.9 ng/mL (ref 10.0–291.0)

## 2011-02-26 LAB — LDL CHOLESTEROL, DIRECT: Direct LDL: 132.8 mg/dL

## 2011-02-26 MED ORDER — QUETIAPINE FUMARATE ER 300 MG PO TB24: 300.0000 mg | ORAL_TABLET | Freq: Every day | ORAL | Status: DC

## 2011-02-26 MED ORDER — CLONAZEPAM 1 MG PO TABS: 1.0000 mg | ORAL_TABLET | Freq: Three times a day (TID) | ORAL | Status: DC

## 2011-02-26 MED ORDER — GABAPENTIN 400 MG PO CAPS: 400.0000 mg | ORAL_CAPSULE | Freq: Three times a day (TID) | ORAL | Status: DC

## 2011-02-26 MED ORDER — QUETIAPINE FUMARATE 25 MG PO TABS: 50.0000 mg | ORAL_TABLET | Freq: Every day | ORAL | Status: DC

## 2011-02-26 NOTE — Patient Instructions (Signed)

## 2011-02-26 NOTE — Progress Notes (Signed)
Subjective:    Patient ID: Shannon [name], female    DOB: 06-15-1965, 46 y.o.   MRN: [id number]  Anemia Presents for follow-up visit. There has been no abdominal pain, anorexia, bruising/bleeding easily, confusion, fever, leg swelling, light-headedness, malaise/fatigue, pallor, palpitations, paresthesias, pica or weight loss. Signs of blood loss that are not present include hematemesis, hematochezia, melena, menorrhagia and vaginal bleeding. Compliance problems include psychosocial issues.  Compliance with medications is 0-25%.      Review of Systems  Constitutional: Negative for fever, chills, weight loss, malaise/fatigue, diaphoresis, activity change, appetite change, fatigue and unexpected weight change.  HENT: Negative.   Eyes: Negative.   Respiratory: Negative for cough, chest tightness, shortness of breath, wheezing and stridor.   Cardiovascular: Negative for chest pain and palpitations.  Gastrointestinal: Negative for nausea, vomiting, abdominal pain, diarrhea, constipation, melena, hematochezia, anorexia and hematemesis.  Genitourinary: Negative for dysuria, urgency, frequency, hematuria, flank pain, decreased urine volume, vaginal bleeding, vaginal discharge, enuresis, difficulty urinating, genital sores, vaginal pain, menstrual problem, pelvic pain, dyspareunia and menorrhagia.  Musculoskeletal: Negative for myalgias, back pain, joint swelling, arthralgias and gait problem.  Skin: Negative for color change, pallor, rash and wound.  Neurological: Negative for dizziness, tremors, seizures, syncope, facial asymmetry, speech difficulty, weakness, light-headedness, numbness, headaches and paresthesias.  Hematological: Negative for adenopathy. Does not bruise/bleed easily.  Psychiatric/Behavioral: Positive for sleep disturbance (dfa). Negative for suicidal ideas, hallucinations, behavioral problems, confusion, self-injury, dysphoric mood, decreased concentration and agitation. The patient  is nervous/anxious. The patient is not hyperactive.        Objective:   Physical Exam  Vitals reviewed. Constitutional: She is oriented to person, place, and time. She appears well-developed and well-nourished. No distress.  HENT:  Head: Normocephalic and atraumatic.  Mouth/Throat: Oropharynx is clear and moist. No oropharyngeal exudate.  Eyes: Conjunctivae are normal. Right eye exhibits no discharge. Left eye exhibits no discharge. No scleral icterus.  Neck: Normal range of motion. Neck supple. No JVD present. No tracheal deviation present. No thyromegaly present.  Cardiovascular: Normal rate, regular rhythm, normal heart sounds and intact distal pulses.  Exam reveals no gallop and no friction rub.   No murmur heard. Pulmonary/Chest: Effort normal and breath sounds normal. No stridor. No respiratory distress. She has no wheezes. She has no rales. She exhibits no tenderness.  Abdominal: Soft. Bowel sounds are normal. She exhibits no distension and no mass. There is no tenderness. There is no rebound and no guarding.  Musculoskeletal: Normal range of motion. She exhibits no edema and no tenderness.  Lymphadenopathy:    She has no cervical adenopathy.  Neurological: She is oriented to person, place, and time.  Skin: Skin is warm and dry. No rash noted. She is not diaphoretic. No erythema. No pallor.  Psychiatric: She has a normal mood and affect. Her behavior is normal. Judgment and thought content normal.      Lab Results  Component Value Date   WBC 9.9 07/15/2010   HGB 13.0 07/15/2010   HCT 39.4 07/15/2010   PLT 293 07/15/2010   GLUCOSE 73 07/10/2010   CHOL 244* 11/06/2009   TRIG 151.0* 11/06/2009   HDL 36.10* 11/06/2009   LDLDIRECT 184.8 11/06/2009   ALT 19 07/10/2010   AST 15 07/10/2010   NA 138 07/10/2010   K 4.0 07/10/2010   CL 104 07/10/2010   CREATININE 0.53 07/10/2010   BUN 8 07/10/2010   CO2 26 07/10/2010   TSH 1.90 11/06/2009   HGBA1C 5.7 11/06/2009  Assessment & Plan:

## 2011-02-27 ENCOUNTER — Ambulatory Visit (INDEPENDENT_AMBULATORY_CARE_PROVIDER_SITE_OTHER): Payer: Medicare Other | Admitting: Physician Assistant

## 2011-02-27 ENCOUNTER — Encounter: Payer: Self-pay | Admitting: Internal Medicine

## 2011-02-27 ENCOUNTER — Encounter (INDEPENDENT_AMBULATORY_CARE_PROVIDER_SITE_OTHER): Payer: Self-pay

## 2011-02-27 VITALS — BP 144/100 | HR 84 | Temp 97.3°F | Resp 20 | Ht 63.0 in | Wt 260.2 lb

## 2011-02-27 DIAGNOSIS — Z4651 Encounter for fitting and adjustment of gastric lap band: Secondary | ICD-10-CM

## 2011-02-27 NOTE — Progress Notes (Signed)
  HISTORY: Shannon [name] is a 46 y.o.female who received an AP-Large lap-band in May 2012 by Dr. Ezzard Standing. She comes in having last been seen in October 2012 and has lost four pounds. She denies vomiting or regurgitation symptoms. She believes an adjustment would be helpful.  VITAL SIGNS: Filed Vitals:   02/27/11 0915  BP: 144/100  Pulse: 84  Temp: 97.3 F (36.3 C)  Resp: 20    PHYSICAL EXAM: Physical exam reveals a very well-appearing 46 y.o.female in no apparent distress Neurologic: Awake, alert, oriented Psych: Bright affect, conversant Respiratory: Breathing even and unlabored. No stridor or wheezing Abdomen: Soft, nontender, nondistended to palpation. Incisions well-healed. No incisional hernias. Port easily palpated. Extremities: Atraumatic, good range of motion.  ASSESMENT: 46 y.o.  female  s/p AP-Large lap-band.   PLAN: The patient's port was accessed with a 20G Huber needle without difficulty. Clear fluid was aspirated and 0.5 mL saline was added to the port to give a total predicted volume of 7 mL. The patient was able to swallow water without difficulty following the procedure and was instructed to take clear liquids for the next 24-48 hours and advance slowly as tolerated.

## 2011-02-27 NOTE — Patient Instructions (Signed)
Take clear liquids for the next 48 hours. Thin protein shakes are ok to start on Saturday evening. Call us if you have persistent vomiting or regurgitation, night cough or reflux symptoms. Return as scheduled or sooner if you notice no changes in hunger/portion sizes.   

## 2011-03-01 NOTE — Assessment & Plan Note (Signed)
I will check her CBC and her iron level 

## 2011-03-01 NOTE — Assessment & Plan Note (Signed)
I will check labs to look for malabsorption

## 2011-03-01 NOTE — Assessment & Plan Note (Signed)
I will check her FLP today to see how well her lifestyle modifications have been working

## 2011-03-01 NOTE — Assessment & Plan Note (Signed)
Her BP is well controlled today 

## 2011-03-01 NOTE — Assessment & Plan Note (Signed)
She is doing well on the current regimen so will continue those for now

## 2011-03-27 ENCOUNTER — Encounter (INDEPENDENT_AMBULATORY_CARE_PROVIDER_SITE_OTHER): Payer: Self-pay

## 2011-03-27 ENCOUNTER — Ambulatory Visit (INDEPENDENT_AMBULATORY_CARE_PROVIDER_SITE_OTHER): Payer: Medicare Other | Admitting: Physician Assistant

## 2011-03-27 VITALS — BP 170/96 | Ht 63.0 in | Wt 264.4 lb

## 2011-03-27 DIAGNOSIS — Z4651 Encounter for fitting and adjustment of gastric lap band: Secondary | ICD-10-CM

## 2011-03-27 NOTE — Progress Notes (Signed)
  HISTORY: Shannon [name] is a 46 y.o.female who received an AP-Large lap-band in May 2012 by Dr. Ezzard Standing. She had some trouble with her gabapentin prescription which has resolved. She said she feels much better today than during her previous appointment. She says she's still hungry and eating substantially but having no untoward symptoms. She wants to meet with Amy to get back on track nutritionally. Her exercise level is fairly low.  VITAL SIGNS: Filed Vitals:   03/27/11 1031  BP: 170/96    PHYSICAL EXAM: Physical exam reveals a very well-appearing 46 y.o.female in no apparent distress Neurologic: Awake, alert, oriented Psych: Bright affect, conversant Respiratory: Breathing even and unlabored. No stridor or wheezing Abdomen: Soft, nontender, nondistended to palpation. Incisions well-healed. No incisional hernias. Port easily palpated. Extremities: Atraumatic, good range of motion.  ASSESMENT: 46 y.o.  female  s/p AP-Large lap-band.   PLAN: The patient's port was accessed with a 20G Huber needle without difficulty. Clear fluid was aspirated and 0.5 mL saline was added to the port to give a total predicted volume of 7.5 mL. The patient was able to swallow water without difficulty following the procedure and was instructed to take clear liquids for the next 24-48 hours and advance slowly as tolerated. We had an extensive discussion of the red/yellow/green zones and maladaptive eating. It sounds like she's eating slider foods already which certainly isn't helping. We talked also about her need to increase her daily exercise. She voiced understanding and agreement.

## 2011-03-27 NOTE — Patient Instructions (Signed)
Take clear liquids for the next 48 hours. Thin protein shakes are ok to start on Saturday evening. You may begin foods with the consistency of yogurt, cottage cheese, cream soups, etc. on Sunday. Call us if you have persistent vomiting or regurgitation, night cough or reflux symptoms. Return as scheduled or sooner if you notice no changes in hunger/portion sizes.   

## 2011-03-30 ENCOUNTER — Ambulatory Visit (HOSPITAL_COMMUNITY): Payer: Medicare Other

## 2011-04-02 ENCOUNTER — Telehealth: Payer: Self-pay | Admitting: Internal Medicine

## 2011-04-02 NOTE — Telephone Encounter (Signed)
The pt called and stated she needs something called into the pharmacy to help with anxiety.  She states her daughter has been missing for four days, she has not eaten in several days, and she has not slept in four days.  Please advise if something can be called in for her, or if needs to be scheduled for an office visit.    Thanks!

## 2011-04-03 ENCOUNTER — Encounter: Payer: Self-pay | Admitting: Internal Medicine

## 2011-04-03 ENCOUNTER — Ambulatory Visit (INDEPENDENT_AMBULATORY_CARE_PROVIDER_SITE_OTHER): Payer: Medicare Other | Admitting: Internal Medicine

## 2011-04-03 VITALS — BP 136/90 | HR 80 | Temp 98.1°F | Resp 16 | Wt 262.0 lb

## 2011-04-03 DIAGNOSIS — G4700 Insomnia, unspecified: Secondary | ICD-10-CM

## 2011-04-03 MED ORDER — ZOLPIDEM TARTRATE 5 MG PO TABS: 5.0000 mg | ORAL_TABLET | Freq: Every evening | ORAL | Status: DC | PRN

## 2011-04-03 NOTE — Patient Instructions (Signed)

## 2011-04-04 ENCOUNTER — Encounter: Payer: Self-pay | Admitting: Internal Medicine

## 2011-04-04 DIAGNOSIS — G4700 Insomnia, unspecified: Secondary | ICD-10-CM | POA: Insufficient documentation

## 2011-04-04 HISTORY — DX: Insomnia, unspecified: G47.00

## 2011-04-04 NOTE — Assessment & Plan Note (Signed)
I reassured her that I think her daughter will be found safely, in the meantime I offered her an Rx for ambien to help with her sleep and I asked her to stay in touch with me and her therapist about her emotional well being

## 2011-04-04 NOTE — Progress Notes (Signed)
  Subjective:    Patient ID: Shannon [name], female    DOB: 09/19/1965, 46 y.o.   MRN: [id number]  HPI  She returns and tells me that she is distressed b/c her daughter who lives in the [place] area with her ex-husband has run away from a home and has not been found for a week now but she has been calling her father. The pt has had trouble sleeping and feels sad, anxious and has been crying.  Review of Systems  Psychiatric/Behavioral: Positive for dysphoric mood. Negative for suicidal ideas, hallucinations, behavioral problems, confusion, self-injury, decreased concentration and agitation. The patient is not hyperactive.        Objective:   Physical Exam  Vitals reviewed. Constitutional: She appears well-developed. No distress.  Eyes: Conjunctivae are normal. Right eye exhibits no discharge. Left eye exhibits no discharge. No scleral icterus.  Neck: Neck supple. No JVD present. No tracheal deviation present. No thyromegaly present.  Cardiovascular: Normal rate, regular rhythm, normal heart sounds and intact distal pulses.  Exam reveals no gallop and no friction rub.   No murmur heard. Pulmonary/Chest: Effort normal and breath sounds normal. No stridor. No respiratory distress. She has no wheezes. She has no rales. She exhibits no tenderness.  Abdominal: Soft. Bowel sounds are normal. She exhibits no distension and no mass. There is no tenderness. There is no rebound and no guarding.  Lymphadenopathy:    She has no cervical adenopathy.  Skin: Skin is warm and dry. No rash noted. She is not diaphoretic. No erythema.  Psychiatric: Her behavior is normal. Judgment normal. Her mood appears anxious. Her affect is not angry, not blunt, not labile and not inappropriate. Her speech is tangential. Her speech is not rapid and/or pressured, not delayed and not slurred. She is not agitated, not aggressive, is not hyperactive, not slowed, not withdrawn, not actively hallucinating and not combative. Thought  content is not paranoid and not delusional. Cognition and memory are normal. She exhibits a depressed mood (tearful and distraught). She expresses no homicidal and no suicidal ideation. She expresses no suicidal plans and no homicidal plans. She is communicative. She is attentive.          Assessment & Plan:

## 2011-04-06 ENCOUNTER — Telehealth: Payer: Self-pay

## 2011-04-06 NOTE — Telephone Encounter (Signed)
yes

## 2011-04-06 NOTE — Telephone Encounter (Signed)
Pt called requesting a note excusing her from school for the next few days. Pt says MD is aware of the reason for the request.

## 2011-04-06 NOTE — Telephone Encounter (Signed)
Returned back to patient/LMOVM requesting to know exact dates for excuse

## 2011-04-06 NOTE — Telephone Encounter (Signed)
Patient notified

## 2011-04-17 ENCOUNTER — Encounter (INDEPENDENT_AMBULATORY_CARE_PROVIDER_SITE_OTHER): Payer: Self-pay

## 2011-04-17 ENCOUNTER — Ambulatory Visit (INDEPENDENT_AMBULATORY_CARE_PROVIDER_SITE_OTHER): Payer: Medicare Other | Admitting: Physician Assistant

## 2011-04-17 VITALS — BP 144/88 | HR 80 | Temp 97.6°F | Resp 18 | Ht 63.0 in | Wt 260.6 lb

## 2011-04-17 DIAGNOSIS — Z4651 Encounter for fitting and adjustment of gastric lap band: Secondary | ICD-10-CM

## 2011-04-17 NOTE — Patient Instructions (Signed)
Take clear liquids for the next 48 hours. Thin protein shakes are ok to start on Saturday evening. You may begin foods with the consistency of yogurt, cottage cheese, cream soups, etc. on Sunday. Call us if you have persistent vomiting or regurgitation, night cough or reflux symptoms. Return as scheduled or sooner if you notice no changes in hunger/portion sizes.   

## 2011-04-17 NOTE — Progress Notes (Signed)
  HISTORY: Shannon [name] is a 46 y.o.female who received an AP-Large lap-band in May 2012 by Dr. Ezzard Standing. She comes in with some complaints of hunger and still eating large portion sizes. She has cut out coffee and the associated sweeteners. This could have contributed to some loose stools which have now resolved.  VITAL SIGNS: Filed Vitals:   04/17/11 0946  BP: 144/88  Pulse: 80  Temp: 97.6 F (36.4 C)  Resp: 18    PHYSICAL EXAM: Physical exam reveals a very well-appearing 46 y.o.female in no apparent distress Neurologic: Awake, alert, oriented Psych: Bright affect, conversant Respiratory: Breathing even and unlabored. No stridor or wheezing Abdomen: Soft, nontender, nondistended to palpation. Incisions well-healed. No incisional hernias. Port easily palpated. Extremities: Atraumatic, good range of motion.  ASSESMENT: 46 y.o.  female  s/p AP-Large lap-band.   PLAN: The patient's port was accessed with a 20G Huber needle without difficulty. Clear fluid was aspirated and 0.5 mL saline was added to the port to give a total predicted volume of 8 mL. The patient was able to swallow water without difficulty following the procedure and was instructed to take clear liquids for the next 24-48 hours and advance slowly as tolerated.

## 2011-05-28 ENCOUNTER — Encounter (INDEPENDENT_AMBULATORY_CARE_PROVIDER_SITE_OTHER): Payer: Medicare Other

## 2011-06-01 ENCOUNTER — Encounter (INDEPENDENT_AMBULATORY_CARE_PROVIDER_SITE_OTHER): Payer: Self-pay | Admitting: Physician Assistant

## 2011-08-26 ENCOUNTER — Other Ambulatory Visit: Payer: Self-pay | Admitting: Internal Medicine

## 2011-08-31 ENCOUNTER — Ambulatory Visit: Payer: Medicare Other | Admitting: Internal Medicine

## 2011-09-01 ENCOUNTER — Ambulatory Visit (INDEPENDENT_AMBULATORY_CARE_PROVIDER_SITE_OTHER): Payer: Medicare Other | Admitting: Internal Medicine

## 2011-09-01 ENCOUNTER — Other Ambulatory Visit (INDEPENDENT_AMBULATORY_CARE_PROVIDER_SITE_OTHER): Payer: Medicare Other

## 2011-09-01 ENCOUNTER — Encounter: Payer: Self-pay | Admitting: Internal Medicine

## 2011-09-01 VITALS — BP 140/86 | HR 80 | Temp 98.2°F | Resp 16 | Wt 224.0 lb

## 2011-09-01 DIAGNOSIS — I10 Essential (primary) hypertension: Secondary | ICD-10-CM

## 2011-09-01 DIAGNOSIS — F319 Bipolar disorder, unspecified: Secondary | ICD-10-CM

## 2011-09-01 DIAGNOSIS — D509 Iron deficiency anemia, unspecified: Secondary | ICD-10-CM

## 2011-09-01 DIAGNOSIS — F419 Anxiety disorder, unspecified: Secondary | ICD-10-CM

## 2011-09-01 DIAGNOSIS — E876 Hypokalemia: Secondary | ICD-10-CM | POA: Insufficient documentation

## 2011-09-01 DIAGNOSIS — F411 Generalized anxiety disorder: Secondary | ICD-10-CM

## 2011-09-01 HISTORY — DX: Hypokalemia: E87.6

## 2011-09-01 LAB — BASIC METABOLIC PANEL
BUN: 6 mg/dL (ref 6–23)
CO2: 24 mEq/L (ref 19–32)
Calcium: 8.4 mg/dL (ref 8.4–10.5)
Chloride: 106 mEq/L (ref 96–112)
Creatinine, Ser: 0.7 mg/dL (ref 0.4–1.2)
GFR: 119.18 mL/min (ref >60.00)
Glucose, Bld: 98 mg/dL (ref 70–99)
Potassium: 3.1 mEq/L — ABNORMAL LOW (ref 3.5–5.1)
Sodium: 139 mEq/L (ref 135–145)

## 2011-09-01 LAB — CBC WITH DIFFERENTIAL/PLATELET
Basophils Absolute: 0 10*3/uL (ref 0.0–0.1)
Basophils Relative: 0.4 % (ref 0.0–3.0)
Eosinophils Absolute: 0.1 10*3/uL (ref 0.0–0.7)
Eosinophils Relative: 2.2 % (ref 0.0–5.0)
HCT: 41.1 % (ref 36.0–46.0)
Hemoglobin: 14 g/dL (ref 12.0–15.0)
Lymphocytes Relative: 24.2 % (ref 12.0–46.0)
Lymphs Abs: 1.3 10*3/uL (ref 0.7–4.0)
MCHC: 34.1 g/dL (ref 30.0–36.0)
MCV: 88 fl (ref 78.0–100.0)
Monocytes Absolute: 0.2 10*3/uL (ref 0.1–1.0)
Monocytes Relative: 4.6 % (ref 3.0–12.0)
Neutro Abs: 3.7 10*3/uL (ref 1.4–7.7)
Neutrophils Relative %: 68.6 % (ref 43.0–77.0)
Platelets: 258 10*3/uL (ref 150.0–400.0)
RBC: 4.67 Mil/uL (ref 3.87–5.11)
RDW: 14.6 % (ref 11.5–14.6)
WBC: 5.4 10*3/uL (ref 4.5–10.5)

## 2011-09-01 MED ORDER — CLONAZEPAM 1 MG PO TABS: 1.0000 mg | ORAL_TABLET | Freq: Three times a day (TID) | ORAL | Status: DC

## 2011-09-01 MED ORDER — POTASSIUM CHLORIDE CRYS ER 20 MEQ PO TBCR: 20.0000 meq | EXTENDED_RELEASE_TABLET | Freq: Two times a day (BID) | ORAL | Status: DC

## 2011-09-01 NOTE — Progress Notes (Signed)
Subjective:    Patient ID: Shannon [name], female    DOB: 05/07/1965, 46 y.o.   MRN: [id number]  Anxiety Presents for follow-up visit. Symptoms include excessive worry, irritability and nervous/anxious behavior. Patient reports no chest pain, compulsions, confusion, decreased concentration, depressed mood, dizziness, dry mouth, feeling of choking, hyperventilation, insomnia, malaise, muscle tension, nausea, obsessions, palpitations, panic, restlessness, shortness of breath or suicidal ideas. Symptoms occur most days. The severity of symptoms is mild. The patient sleeps 7 hours per night. The quality of sleep is good. Nighttime awakenings: none.   Compliance with medications is 76-100%.      Review of Systems  Constitutional: Positive for irritability.  HENT: Negative.   Eyes: Negative.   Respiratory: Negative for cough, chest tightness, shortness of breath, wheezing and stridor.   Cardiovascular: Negative for chest pain, palpitations and leg swelling.  Gastrointestinal: Negative for nausea, vomiting, abdominal pain, diarrhea and constipation.  Genitourinary: Negative.   Musculoskeletal: Negative for myalgias, back pain, joint swelling, arthralgias and gait problem.  Skin: Negative for color change, pallor, rash and wound.  Neurological: Negative for dizziness, tremors, seizures, syncope, facial asymmetry, speech difficulty, weakness, light-headedness, numbness and headaches.  Hematological: Negative for adenopathy. Does not bruise/bleed easily.  Psychiatric/Behavioral: Negative for suicidal ideas, hallucinations, behavioral problems, confusion, disturbed wake/sleep cycle, self-injury, dysphoric mood, decreased concentration and agitation. The patient is nervous/anxious. The patient does not have insomnia and is not hyperactive.        Objective:   Physical Exam  Vitals reviewed. Constitutional: She is oriented to person, place, and time. She appears well-developed and well-nourished.  No distress.  HENT:  Head: Normocephalic and atraumatic.  Mouth/Throat: Oropharynx is clear and moist. No oropharyngeal exudate.  Eyes: Conjunctivae are normal. Right eye exhibits no discharge. Left eye exhibits no discharge. No scleral icterus.  Neck: Normal range of motion. Neck supple. No JVD present. No tracheal deviation present. No thyromegaly present.  Cardiovascular: Normal rate, regular rhythm, normal heart sounds and intact distal pulses.  Exam reveals no gallop and no friction rub.   No murmur heard. Pulmonary/Chest: Effort normal and breath sounds normal. No stridor. No respiratory distress. She has no wheezes. She has no rales. She exhibits no tenderness.  Abdominal: Soft. Bowel sounds are normal. She exhibits no distension and no mass. There is no tenderness. There is no rebound and no guarding.  Musculoskeletal: Normal range of motion. She exhibits no edema and no tenderness.  Lymphadenopathy:    She has no cervical adenopathy.  Neurological: She is oriented to person, place, and time.  Skin: Skin is warm and dry. No rash noted. She is not diaphoretic. No erythema. No pallor.  Psychiatric: She has a normal mood and affect. Her speech is normal and behavior is normal. Judgment and thought content normal. Her mood appears not anxious. Her affect is not angry, not blunt, not labile and not inappropriate. Cognition and memory are normal. She does not exhibit a depressed mood.      Lab Results  Component Value Date   WBC 7.6 02/26/2011   HGB 14.7 02/26/2011   HCT 42.5 02/26/2011   PLT 266.0 02/26/2011   GLUCOSE 92 02/26/2011   CHOL 224* 02/26/2011   TRIG 260.0* 02/26/2011   HDL 57.20 02/26/2011   LDLDIRECT 132.8 02/26/2011   ALT 23 02/26/2011   AST 19 02/26/2011   NA 139 02/26/2011   K 3.4* 02/26/2011   CL 106 02/26/2011   CREATININE 0.7 02/26/2011   BUN 15 02/26/2011  CO2 25 02/26/2011   TSH 4.04 02/26/2011   HGBA1C 5.7 11/06/2009      Assessment & Plan:

## 2011-09-01 NOTE — Assessment & Plan Note (Signed)
Her K+ level is still low so I have asked her to start K+ replacement therapy

## 2011-09-01 NOTE — Assessment & Plan Note (Signed)
Her BP is well controlled 

## 2011-09-01 NOTE — Assessment & Plan Note (Signed)
I will recheck her CBC 

## 2011-09-01 NOTE — Assessment & Plan Note (Signed)
She will continue her current meds.  

## 2011-09-01 NOTE — Patient Instructions (Signed)

## 2011-09-17 ENCOUNTER — Encounter (INDEPENDENT_AMBULATORY_CARE_PROVIDER_SITE_OTHER): Payer: Medicare Other

## 2011-09-21 ENCOUNTER — Encounter (INDEPENDENT_AMBULATORY_CARE_PROVIDER_SITE_OTHER): Payer: Self-pay | Admitting: Physician Assistant

## 2011-12-09 ENCOUNTER — Telehealth: Payer: Self-pay | Admitting: Internal Medicine

## 2011-12-09 NOTE — Telephone Encounter (Signed)
Caller: Nataliee/Patient; Patient Name: [name]; PCP: Sanda Linger (Adults only); Best Callback Phone Number: [phone]. Caller reports she began taking the K-dur (bid) again on Sunday 10/13 and diarrhea developed again on Monday 10/14. Caller reports she and MD discussed adding K supplement when she was seen in the office on 09/01/2011. Caller reports each time she has tried adding this supplement she develops diarrhea, and sometimes she is not able to get to the bathroom in time. Having up to 6-8 stools/day and gets up at night to have BM.  Per Diarrhea or Other Changes in Bowel Habits Protocol, Symptoms began after starting a new prescription medication.  RN offered caller an appointment to be seen in the next 24-48 hours. Caller declined appointment, stating, "My car is the shop and I do not have the money to pay for office visit." Caller asking if she needs to continue taking this medication based on diarrhea.  Caller advised to hold dose for tonight based on frequency of loose stools. Advised a note will be sent for a callback on Thurs 10/17 from PCP.

## 2012-03-21 ENCOUNTER — Other Ambulatory Visit: Payer: Self-pay | Admitting: Internal Medicine

## 2012-03-28 ENCOUNTER — Ambulatory Visit: Payer: Medicare Other | Admitting: Internal Medicine

## 2012-03-28 ENCOUNTER — Ambulatory Visit (INDEPENDENT_AMBULATORY_CARE_PROVIDER_SITE_OTHER): Payer: Medicare Other | Admitting: Internal Medicine

## 2012-03-28 ENCOUNTER — Encounter: Payer: Self-pay | Admitting: Internal Medicine

## 2012-03-28 VITALS — BP 104/68 | HR 98 | Temp 98.7°F | Resp 16 | Ht 63.0 in | Wt 200.2 lb

## 2012-03-28 DIAGNOSIS — D509 Iron deficiency anemia, unspecified: Secondary | ICD-10-CM

## 2012-03-28 DIAGNOSIS — F419 Anxiety disorder, unspecified: Secondary | ICD-10-CM

## 2012-03-28 DIAGNOSIS — D510 Vitamin B12 deficiency anemia due to intrinsic factor deficiency: Secondary | ICD-10-CM

## 2012-03-28 DIAGNOSIS — Z9884 Bariatric surgery status: Secondary | ICD-10-CM

## 2012-03-28 DIAGNOSIS — I10 Essential (primary) hypertension: Secondary | ICD-10-CM

## 2012-03-28 DIAGNOSIS — E785 Hyperlipidemia, unspecified: Secondary | ICD-10-CM

## 2012-03-28 DIAGNOSIS — F411 Generalized anxiety disorder: Secondary | ICD-10-CM

## 2012-03-28 DIAGNOSIS — E876 Hypokalemia: Secondary | ICD-10-CM

## 2012-03-28 DIAGNOSIS — F319 Bipolar disorder, unspecified: Secondary | ICD-10-CM

## 2012-03-28 HISTORY — DX: Vitamin B12 deficiency anemia due to intrinsic factor deficiency: D51.0

## 2012-03-28 MED ORDER — CLONAZEPAM 1 MG PO TABS: 1.0000 mg | ORAL_TABLET | Freq: Three times a day (TID) | ORAL | Status: DC

## 2012-03-28 MED ORDER — QUETIAPINE FUMARATE 25 MG PO TABS: 50.0000 mg | ORAL_TABLET | Freq: Every day | ORAL | Status: DC

## 2012-03-28 MED ORDER — GABAPENTIN 400 MG PO CAPS: 400.0000 mg | ORAL_CAPSULE | Freq: Three times a day (TID) | ORAL | Status: DC

## 2012-03-28 MED ORDER — QUETIAPINE FUMARATE ER 300 MG PO TB24: 300.0000 mg | ORAL_TABLET | Freq: Every day | ORAL | Status: DC

## 2012-03-28 NOTE — Assessment & Plan Note (Signed)
CBC and iron level today.

## 2012-03-28 NOTE — Assessment & Plan Note (Signed)
I will recheck her K+ level today. Will also check her Mg++ level.

## 2012-03-28 NOTE — Patient Instructions (Signed)

## 2012-03-28 NOTE — Assessment & Plan Note (Signed)
She is doing well on her current meds  No changes made today

## 2012-03-28 NOTE — Assessment & Plan Note (Signed)
Her BP is well controlled 

## 2012-03-28 NOTE — Assessment & Plan Note (Signed)
CBC B12 Folate levels today

## 2012-03-28 NOTE — Progress Notes (Signed)
  Subjective:    Patient ID: Shannon [name], female    DOB: 11-04-1965, 47 y.o.   MRN: [id number]  Anxiety Presents for follow-up visit. Symptoms include depressed mood, excessive worry, nervous/anxious behavior and panic. Patient reports no chest pain, compulsions, confusion, decreased concentration, dizziness, dry mouth, feeling of choking, hyperventilation, insomnia, irritability, malaise, muscle tension, nausea, obsessions, palpitations, restlessness, shortness of breath or suicidal ideas. Symptoms occur occasionally. The severity of symptoms is mild. The quality of sleep is fair. Nighttime awakenings: none.   Compliance with medications is 76-100%.      Review of Systems  Constitutional: Negative for fever, chills, diaphoresis, activity change, appetite change, irritability, fatigue and unexpected weight change.  HENT: Negative.   Eyes: Negative.   Respiratory: Negative.  Negative for shortness of breath.   Cardiovascular: Negative.  Negative for chest pain, palpitations and leg swelling.  Gastrointestinal: Negative for nausea, vomiting, abdominal pain, diarrhea, constipation and blood in stool.  Genitourinary: Negative.   Musculoskeletal: Negative for myalgias, back pain, joint swelling, arthralgias and gait problem.  Skin: Negative for color change, pallor, rash and wound.  Neurological: Negative.  Negative for dizziness, weakness and light-headedness.  Hematological: Negative.  Negative for adenopathy. Does not bruise/bleed easily.  Psychiatric/Behavioral: Positive for dysphoric mood. Negative for suicidal ideas, hallucinations, behavioral problems, confusion, sleep disturbance, self-injury, decreased concentration and agitation. The patient is nervous/anxious. The patient does not have insomnia and is not hyperactive.        Objective:   Physical Exam  Vitals reviewed. Constitutional: She is oriented to person, place, and time. She appears well-developed and well-nourished. No  distress.  HENT:  Head: Normocephalic and atraumatic.  Mouth/Throat: Oropharynx is clear and moist. No oropharyngeal exudate.  Eyes: Conjunctivae normal are normal. Right eye exhibits no discharge. Left eye exhibits no discharge. No scleral icterus.  Neck: Normal range of motion. Neck supple. No JVD present. No tracheal deviation present. No thyromegaly present.  Cardiovascular: Normal rate, regular rhythm, normal heart sounds and intact distal pulses.  Exam reveals no gallop and no friction rub.   No murmur heard. Pulmonary/Chest: Effort normal and breath sounds normal. No stridor. No respiratory distress. She has no wheezes. She has no rales. She exhibits no tenderness.  Abdominal: Soft. Bowel sounds are normal. She exhibits no distension and no mass. There is no tenderness. There is no rebound and no guarding.  Musculoskeletal: Normal range of motion. She exhibits no edema and no tenderness.  Lymphadenopathy:    She has no cervical adenopathy.  Neurological: She is oriented to person, place, and time.  Skin: Skin is warm and dry. No rash noted. She is not diaphoretic. No erythema. No pallor.  Psychiatric: She has a normal mood and affect. Her behavior is normal. Judgment and thought content normal.     Lab Results  Component Value Date   WBC 5.4 09/01/2011   HGB 14.0 09/01/2011   HCT 41.1 09/01/2011   PLT 258.0 09/01/2011   GLUCOSE 98 09/01/2011   CHOL 224* 02/26/2011   TRIG 260.0* 02/26/2011   HDL 57.20 02/26/2011   LDLDIRECT 132.8 02/26/2011   ALT 23 02/26/2011   AST 19 02/26/2011   NA 139 09/01/2011   K 3.1* 09/01/2011   CL 106 09/01/2011   CREATININE 0.7 09/01/2011   BUN 6 09/01/2011   CO2 24 09/01/2011   TSH 4.04 02/26/2011   HGBA1C 5.7 11/06/2009       Assessment & Plan:

## 2012-03-28 NOTE — Assessment & Plan Note (Signed)
I will check her labs today to look for malabsorption

## 2012-04-06 ENCOUNTER — Ambulatory Visit (HOSPITAL_COMMUNITY): Payer: Medicare Other

## 2012-04-15 ENCOUNTER — Ambulatory Visit (HOSPITAL_COMMUNITY): Payer: Medicare Other

## 2012-04-21 ENCOUNTER — Ambulatory Visit (HOSPITAL_COMMUNITY)
Admission: RE | Admit: 2012-04-21 | Discharge: 2012-04-21 | Disposition: A | Discharge status: Home / Self Care | Payer: PRIVATE HEALTH INSURANCE | Source: Ambulatory Visit | Attending: Internal Medicine | Admitting: Internal Medicine

## 2012-04-21 DIAGNOSIS — Z1231 Encounter for screening mammogram for malignant neoplasm of breast: Secondary | ICD-10-CM | POA: Insufficient documentation

## 2012-04-22 LAB — HM MAMMOGRAPHY: HM Mammogram: NORMAL

## 2012-04-28 ENCOUNTER — Encounter (INDEPENDENT_AMBULATORY_CARE_PROVIDER_SITE_OTHER): Payer: Self-pay | Admitting: Physician Assistant

## 2012-04-28 ENCOUNTER — Ambulatory Visit (INDEPENDENT_AMBULATORY_CARE_PROVIDER_SITE_OTHER): Payer: PRIVATE HEALTH INSURANCE | Admitting: Physician Assistant

## 2012-04-28 VITALS — BP 134/82 | HR 120 | Temp 98.0°F | Ht 63.0 in | Wt 208.0 lb

## 2012-04-28 DIAGNOSIS — Z4651 Encounter for fitting and adjustment of gastric lap band: Secondary | ICD-10-CM

## 2012-04-28 NOTE — Patient Instructions (Signed)
Take clear liquids tonight. Thin protein shakes are ok to start tomorrow morning. Slowly advance your diet thereafter. Call us if you have persistent vomiting or regurgitation, night cough or reflux symptoms. Return as scheduled or sooner if you notice no changes in hunger/portion sizes.  

## 2012-04-28 NOTE — Progress Notes (Signed)
  HISTORY: Shannon [name] is a 47 y.o.female who received an AP-Large lap-band in May 2012 by Dr. Ezzard Standing. She comes in with an amazing 50 lbs weight loss since her last visit 12 months ago. She's been exercising daily. She has noticed recently that her hunger and portion sizes have increased and she'd like a fill. She denies regurgitation or reflux symptoms.  VITAL SIGNS: Filed Vitals:   04/28/12 1315  BP: 134/82  Pulse: 120  Temp: 98 F (36.7 C)    PHYSICAL EXAM: Physical exam reveals a very well-appearing 47 y.o.female in no apparent distress Neurologic: Awake, alert, oriented Psych: Bright affect, conversant Respiratory: Breathing even and unlabored. No stridor or wheezing Abdomen: Soft, nontender, nondistended to palpation. Incisions well-healed. No incisional hernias. Port easily palpated. Extremities: Atraumatic, good range of motion.  ASSESMENT: 47 y.o.  female  s/p AP-Large lap-band.   PLAN: The patient's port was accessed with a 20G Huber needle without difficulty. Clear fluid was aspirated and 0.5 mL saline was added to the port to give a total predicted volume of 8.5 mL. The patient was able to swallow water without difficulty following the procedure and was instructed to take clear liquids for the next 24-48 hours and advance slowly as tolerated. She attributes her high pulse today to two cups of coffee which she recently consumed.

## 2012-07-10 ENCOUNTER — Encounter (HOSPITAL_COMMUNITY): Payer: Self-pay | Admitting: *Deleted

## 2012-07-10 ENCOUNTER — Emergency Department (HOSPITAL_COMMUNITY)
Admission: EM | Admit: 2012-07-10 | Discharge: 2012-07-12 | Disposition: A | Discharge status: Other Acute Inpatient Hospital | Payer: PRIVATE HEALTH INSURANCE | Attending: Emergency Medicine | Admitting: Emergency Medicine

## 2012-07-10 DIAGNOSIS — Z8739 Personal history of other diseases of the musculoskeletal system and connective tissue: Secondary | ICD-10-CM | POA: Insufficient documentation

## 2012-07-10 DIAGNOSIS — Z8639 Personal history of other endocrine, nutritional and metabolic disease: Secondary | ICD-10-CM | POA: Insufficient documentation

## 2012-07-10 DIAGNOSIS — Z460 Encounter for fitting and adjustment of spectacles and contact lenses: Secondary | ICD-10-CM | POA: Insufficient documentation

## 2012-07-10 DIAGNOSIS — F17200 Nicotine dependence, unspecified, uncomplicated: Secondary | ICD-10-CM | POA: Insufficient documentation

## 2012-07-10 DIAGNOSIS — I10 Essential (primary) hypertension: Secondary | ICD-10-CM | POA: Insufficient documentation

## 2012-07-10 DIAGNOSIS — Z8711 Personal history of peptic ulcer disease: Secondary | ICD-10-CM | POA: Insufficient documentation

## 2012-07-10 DIAGNOSIS — IMO0002 Reserved for concepts with insufficient information to code with codable children: Secondary | ICD-10-CM | POA: Insufficient documentation

## 2012-07-10 DIAGNOSIS — G894 Chronic pain syndrome: Secondary | ICD-10-CM | POA: Insufficient documentation

## 2012-07-10 DIAGNOSIS — F319 Bipolar disorder, unspecified: Secondary | ICD-10-CM | POA: Insufficient documentation

## 2012-07-10 DIAGNOSIS — R45851 Suicidal ideations: Secondary | ICD-10-CM | POA: Insufficient documentation

## 2012-07-10 DIAGNOSIS — F911 Conduct disorder, childhood-onset type: Secondary | ICD-10-CM | POA: Insufficient documentation

## 2012-07-10 DIAGNOSIS — Z79899 Other long term (current) drug therapy: Secondary | ICD-10-CM | POA: Insufficient documentation

## 2012-07-10 DIAGNOSIS — Z862 Personal history of diseases of the blood and blood-forming organs and certain disorders involving the immune mechanism: Secondary | ICD-10-CM | POA: Insufficient documentation

## 2012-07-10 MED ORDER — LORAZEPAM 2 MG/ML IJ SOLN
INTRAMUSCULAR | Status: AC
  Filled 2012-07-10: qty 1

## 2012-07-10 MED ORDER — LORAZEPAM 2 MG/ML IJ SOLN
2.0000 mg | Freq: Once | INTRAMUSCULAR | Status: AC
  Administered 2012-07-11: 2 mg via INTRAMUSCULAR

## 2012-07-10 MED ORDER — HALOPERIDOL LACTATE 5 MG/ML IJ SOLN
5.0000 mg | Freq: Once | INTRAMUSCULAR | Status: AC
  Administered 2012-07-11: 5 mg via INTRAMUSCULAR
  Filled 2012-07-10: qty 1

## 2012-07-10 MED ORDER — LORAZEPAM 1 MG PO TABS
2.0000 mg | ORAL_TABLET | Freq: Once | ORAL | Status: DC
  Filled 2012-07-10: qty 2
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
## 2012-07-11 ENCOUNTER — Emergency Department (HOSPITAL_COMMUNITY): Payer: PRIVATE HEALTH INSURANCE

## 2012-07-11 DIAGNOSIS — F319 Bipolar disorder, unspecified: Secondary | ICD-10-CM

## 2012-07-11 LAB — RAPID URINE DRUG SCREEN, HOSP PERFORMED
Amphetamines: NOT DETECTED
Barbiturates: NOT DETECTED
Benzodiazepines: NOT DETECTED
Cocaine: NOT DETECTED
Opiates: NOT DETECTED
Tetrahydrocannabinol: NOT DETECTED

## 2012-07-11 LAB — COMPREHENSIVE METABOLIC PANEL
ALT: 9 U/L (ref 0–35)
AST: 10 U/L (ref 0–37)
Albumin: 3 g/dL — ABNORMAL LOW (ref 3.5–5.2)
Alkaline Phosphatase: 75 U/L (ref 39–117)
BUN: 6 mg/dL (ref 6–23)
CO2: 25 mEq/L (ref 19–32)
Calcium: 8.8 mg/dL (ref 8.4–10.5)
Chloride: 106 mEq/L (ref 96–112)
Creatinine, Ser: 0.55 mg/dL (ref 0.50–1.10)
GFR calc Af Amer: >90 mL/min (ref >90)
GFR calc non Af Amer: >90 mL/min (ref >90)
Glucose, Bld: 89 mg/dL (ref 70–99)
Potassium: 3 mEq/L — ABNORMAL LOW (ref 3.5–5.1)
Sodium: 140 mEq/L (ref 135–145)
Total Bilirubin: 0.3 mg/dL (ref 0.3–1.2)
Total Protein: 6.8 g/dL (ref 6.0–8.3)

## 2012-07-11 LAB — CBC
HCT: 35.9 % — ABNORMAL LOW (ref 36.0–46.0)
Hemoglobin: 12.3 g/dL (ref 12.0–15.0)
MCH: 28.3 pg (ref 26.0–34.0)
MCHC: 34.3 g/dL (ref 30.0–36.0)
MCV: 82.7 fL (ref 78.0–100.0)
Platelets: 219 10*3/uL (ref 150–400)
RBC: 4.34 MIL/uL (ref 3.87–5.11)
RDW: 14.9 % (ref 11.5–15.5)
WBC: 7.1 10*3/uL (ref 4.0–10.5)

## 2012-07-11 LAB — ETHANOL: Alcohol, Ethyl (B): <11 mg/dL (ref 0–11)

## 2012-07-11 MED ORDER — ZIPRASIDONE HCL 20 MG PO CAPS
20.0000 mg | ORAL_CAPSULE | Freq: Two times a day (BID) | ORAL | Status: DC
  Administered 2012-07-11 (×2): 20 mg via ORAL
  Filled 2012-07-11 (×3): qty 1

## 2012-07-11 MED ORDER — POTASSIUM CHLORIDE CRYS ER 20 MEQ PO TBCR
40.0000 meq | EXTENDED_RELEASE_TABLET | Freq: Once | ORAL | Status: AC
  Administered 2012-07-11: 40 meq via ORAL
  Filled 2012-07-11: qty 2

## 2012-07-11 MED ORDER — ACETAMINOPHEN 325 MG PO TABS
650.0000 mg | ORAL_TABLET | ORAL | Status: DC | PRN
  Administered 2012-07-11: 650 mg via ORAL
  Filled 2012-07-11: qty 2

## 2012-07-11 MED ORDER — GABAPENTIN 400 MG PO CAPS
400.0000 mg | ORAL_CAPSULE | Freq: Three times a day (TID) | ORAL | Status: DC
  Administered 2012-07-11 (×2): 400 mg via ORAL
  Filled 2012-07-11 (×3): qty 1

## 2012-07-11 MED ORDER — IBUPROFEN 600 MG PO TABS: 600.0000 mg | ORAL_TABLET | Freq: Three times a day (TID) | ORAL | Status: DC | PRN

## 2012-07-11 MED ORDER — QUETIAPINE FUMARATE 50 MG PO TABS
350.0000 mg | ORAL_TABLET | Freq: Every day | ORAL | Status: DC
  Administered 2012-07-11: 350 mg via ORAL
  Filled 2012-07-11: qty 1

## 2012-07-11 MED ORDER — LORAZEPAM 1 MG PO TABS: 1.0000 mg | ORAL_TABLET | Freq: Three times a day (TID) | ORAL | Status: DC | PRN

## 2012-07-11 MED ORDER — ZIPRASIDONE MESYLATE 20 MG IM SOLR
20.0000 mg | Freq: Once | INTRAMUSCULAR | Status: AC
  Administered 2012-07-11: 20 mg via INTRAMUSCULAR
  Filled 2012-07-11: qty 20
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
## 2012-07-11 NOTE — BHH Counselor (Signed)
Magistrate Maisie Fus confirmed receipt of IVC paperwork. Copies placed in pt's chart and originals in IVC log in psych ed.  Evette Cristal, [place] Assessment Counselor

## 2012-07-11 NOTE — ED Notes (Signed)
Pt restrained in 4 point gurney restraints for attempt to choke self with cardiac monitor cord and resisting efforts to stop her w/ physical aggression toward providers. Pt unwilling to be verbally or physically redirected.
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
## 2012-07-11 NOTE — BHH Counselor (Addendum)
Patient accepted by Dr. Henrene Hawking Julieanne Cotton, NP to Dr. Geoffery Lyons. The room assignment is 401-1. EDP-Dr. Fredderick Phenix notified of patient's disposition. Patient's nurse-Sheila also made aware. The nursing report # is [phone]. All support paperwork completed and faxed to [facility]. Patient is under IVC and will be transported via [employer].

## 2012-07-11 NOTE — ED Provider Notes (Signed)
PT was accepted by Dr Irma Newness to [facility].  Rolan Bucco, MD 07/11/12 [phone]

## 2012-07-11 NOTE — ED Provider Notes (Signed)
7:19 AM Patient resting.  VS stable.  She is awake, but quiet.  Placement pending.    Gerhard Munch, MD 07/11/12 [phone]

## 2012-07-11 NOTE — ED Notes (Signed)
Chaplain and PhD counseling intern, Mariel Kansky, saw pt at nursing referral.   Pt recently transferred to [facility], sitter at bedside left room at chaplain arrival.  Pt lying in bed, not wholly open to chaplain presence.  Chaplain introduced spiritual care as resource.  Pt conversational for brief period and then ended conversation stating, she was tired and didn't want to talk anymore.   Chaplain provided support around pt transition to [facility].  Pt related being frustrated and feeling as though she was not getting care she was accustomed to at hospitals outside of [place].  Stated that she came in to talk to physician and get medicine, but felt as though she had been waiting too long and she was feeling neglected.  She had left another ED previously for similar feelings.   When chaplain suggested finding out what time physician would be coming, pt was dismissive.  Pt stated she is from [place], her daughter still resides there.  Expressed pride in daughter's education (working on masters in [place] American history)  Pt. has been in [place] 4 years.  Pt moves around every 5-7 years, moved to [place] from [place], [place].  Pt most open when speaking about music and daughter.  Stated that she loved reggae.    Chaplain provided empathic presence and support and worked to build therapeutic relationship and help pt feel known.  Informed nursing of pt's desire for transparency, esp around when she would speak to physician.    Belva Crome MDiv

## 2012-07-12 ENCOUNTER — Inpatient Hospital Stay (HOSPITAL_COMMUNITY)
Admission: AD | Admit: 2012-07-12 | Discharge: 2012-07-15 | DRG: 885 | Disposition: A | Discharge status: Home / Self Care | Payer: PRIVATE HEALTH INSURANCE | Source: Intra-hospital | Attending: Psychiatry | Admitting: Psychiatry

## 2012-07-12 ENCOUNTER — Encounter (HOSPITAL_COMMUNITY): Payer: Self-pay

## 2012-07-12 DIAGNOSIS — Z79899 Other long term (current) drug therapy: Secondary | ICD-10-CM | POA: Diagnosis not present

## 2012-07-12 DIAGNOSIS — F419 Anxiety disorder, unspecified: Secondary | ICD-10-CM

## 2012-07-12 DIAGNOSIS — R45851 Suicidal ideations: Secondary | ICD-10-CM

## 2012-07-12 DIAGNOSIS — G894 Chronic pain syndrome: Secondary | ICD-10-CM | POA: Diagnosis present

## 2012-07-12 DIAGNOSIS — F3160 Bipolar disorder, current episode mixed, unspecified: Secondary | ICD-10-CM

## 2012-07-12 DIAGNOSIS — I10 Essential (primary) hypertension: Secondary | ICD-10-CM | POA: Diagnosis present

## 2012-07-12 DIAGNOSIS — F319 Bipolar disorder, unspecified: Secondary | ICD-10-CM

## 2012-07-12 DIAGNOSIS — F3164 Bipolar disorder, current episode mixed, severe, with psychotic features: Secondary | ICD-10-CM

## 2012-07-12 HISTORY — DX: Bipolar disorder, current episode mixed, unspecified: F31.60

## 2012-07-12 MED ORDER — NICOTINE 14 MG/24HR TD PT24
14.0000 mg | MEDICATED_PATCH | Freq: Every day | TRANSDERMAL | Status: DC
  Filled 2012-07-12: qty 1

## 2012-07-12 MED ORDER — MAGNESIUM HYDROXIDE 400 MG/5ML PO SUSP
30.0000 mL | Freq: Every day | ORAL | Status: DC | PRN
  Administered 2012-07-13: 30 mL via ORAL

## 2012-07-12 MED ORDER — HYDROXYZINE HCL 50 MG PO TABS
50.0000 mg | ORAL_TABLET | Freq: Every evening | ORAL | Status: DC | PRN
  Administered 2012-07-12 – 2012-07-14 (×6): 50 mg via ORAL
  Filled 2012-07-12 (×11): qty 1

## 2012-07-12 MED ORDER — ALUM & MAG HYDROXIDE-SIMETH 200-200-20 MG/5ML PO SUSP: 30.0000 mL | ORAL | Status: DC | PRN

## 2012-07-12 MED ORDER — POTASSIUM CHLORIDE CRYS ER 20 MEQ PO TBCR
20.0000 meq | EXTENDED_RELEASE_TABLET | Freq: Two times a day (BID) | ORAL | Status: AC
  Administered 2012-07-12 – 2012-07-13 (×3): 20 meq via ORAL
  Filled 2012-07-12 (×4): qty 1

## 2012-07-12 MED ORDER — QUETIAPINE FUMARATE ER 300 MG PO TB24
300.0000 mg | ORAL_TABLET | Freq: Every day | ORAL | Status: DC
  Filled 2012-07-12: qty 1

## 2012-07-12 MED ORDER — GABAPENTIN 400 MG PO CAPS
400.0000 mg | ORAL_CAPSULE | Freq: Three times a day (TID) | ORAL | Status: DC
  Administered 2012-07-12 – 2012-07-13 (×4): 400 mg via ORAL
  Filled 2012-07-12 (×7): qty 1

## 2012-07-12 MED ORDER — CLONAZEPAM 0.5 MG PO TABS
0.5000 mg | ORAL_TABLET | Freq: Three times a day (TID) | ORAL | Status: DC | PRN
  Administered 2012-07-12 – 2012-07-14 (×4): 0.5 mg via ORAL
  Filled 2012-07-12 (×5): qty 1

## 2012-07-12 MED ORDER — ACETAMINOPHEN 325 MG PO TABS: 650.0000 mg | ORAL_TABLET | Freq: Four times a day (QID) | ORAL | Status: DC | PRN

## 2012-07-12 MED ORDER — QUETIAPINE FUMARATE ER 400 MG PO TB24
400.0000 mg | ORAL_TABLET | Freq: Every day | ORAL | Status: DC
  Administered 2012-07-12 – 2012-07-14 (×3): 400 mg via ORAL
  Filled 2012-07-12: qty 3
  Filled 2012-07-12 (×2): qty 1
  Filled 2012-07-12: qty 3
  Filled 2012-07-12 (×2): qty 1

## 2012-07-12 MED ORDER — CLONAZEPAM 1 MG PO TABS
1.0000 mg | ORAL_TABLET | Freq: Three times a day (TID) | ORAL | Status: DC
  Administered 2012-07-12: 1 mg via ORAL
  Filled 2012-07-12: qty 1

## 2012-07-12 MED ORDER — NICOTINE POLACRILEX 2 MG MT GUM: 2.0000 mg | CHEWING_GUM | OROMUCOSAL | Status: DC | PRN
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
## 2012-07-12 NOTE — Tx Team (Addendum)
Initial Interdisciplinary Treatment Plan  PATIENT STRENGTHS: (choose at least two) Ability for insight Average or above average intelligence Capable of independent living General fund of knowledge Motivation for treatment/growth  PATIENT STRESSORS: Financial difficulties Loss of grand daughter 11/2011 Medication change or noncompliance   PROBLEM LIST: Problem List/Patient Goals Date to be addressed Date deferred Reason deferred Estimated date of resolution  Increased risk for SI 5/20   D/C  Depression 5/20   D/C  Anxiety 5/20   D/C  Hopelessness 5/20   D/C  Non med compliant for 1 month  (Seroquel 300 mg XR) 5/20   D/C                           DISCHARGE CRITERIA:  Improved stabilization in mood, thinking, and/or behavior Motivation to continue treatment in a less acute level of care Need for constant or close observation no longer present Reduction of life-threatening or endangering symptoms to within safe limits Verbal commitment to aftercare and medication compliance  PRELIMINARY DISCHARGE PLAN: Attend aftercare/continuing care group Attend PHP/IOP Outpatient therapy Return to previous living arrangement  PATIENT/FAMIILY INVOLVEMENT: This treatment plan has been presented to and reviewed with the patient, [name].  The patient and family have been given the opportunity to ask questions and make suggestions.  Joshva Labreck A 07/12/2012, 3:58 AM
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
## 2012-07-12 NOTE — Clinical Social Work Note (Signed)
[facility] LCSW Group Therapy  07/12/2012 , 10:39 AM   Type of Therapy:  Group Therapy  Participation Level:  Active  Participation Quality:  Attentive  Affect:  Appropriate  Cognitive:  Alert  Insight:  Improving  Engagement in Therapy:  Engaged  Modes of Intervention:  Discussion, Exploration and Socialization  Summary of Progress/Problems: Today's group focused on the term Diagnosis.  Participants were asked to define the term, and then pronounce whether it is a negative, positive or neutral term.  Jovie talked about diagnosis as a hypothesis that may be accurate or inaccurate.  She got very focused on the fact that she has been misdiagnosed in the past, and as a result given depakote and lithium, both of which created problems for her.  However, she also acknowledged that it was overall helpful ultimately because it got her headed in the right direction.  She also talked about the stigmatization that she feels from others because of her mental health diagnosis.  As a result, she is very careful about who she confides in.  [name] 07/12/2012 , 10:39 AM

## 2012-07-12 NOTE — Progress Notes (Signed)
Adult Psychoeducational Group Note  Date:  07/12/2012 Time:  2015  Group Topic/Focus:  Wrap-Up Group:   The focus of this group is to help patients review their daily goal of treatment and discuss progress on daily workbooks.  Participation Level:  Active  Participation Quality:  Appropriate and Attentive  Affect:  Appropriate  Cognitive:  Appropriate  Insight:   Engagement in Group:  Engaged  Modes of Intervention:  Discussion  Additional Comments:  Pt stated she had a good day and stated her talk with the doctor earlier had her upset because he wasn't giving her the reason why she take lithium but just wanted her to read up on the medication itself  [name] 07/12/2012, 10:16 PM

## 2012-07-12 NOTE — Progress Notes (Signed)
Pt observed in the dayroom watching TV and talking with her peers.  She is smiling and laughing appropriately. As this writer walks in, she is saying she wants the "light/happy atmosphere" to continue.  Assure pt that this writer wants the same and then asks how her day has been.  She reports it had some rough spots, but she is doing better this evening.  She is able to deny SI/HI/AV and contracts with staff to inform if she has any self harm thoughts.  She voices no needs at this time.  While this writer is talking to another pt, she is so loud and boisterous that it was difficult to hear the other pt.  Pt is redirectable and willing to participate.  Pt plans to go back to school in the fall and finish her studies.  Support and encouragement offered.  Pt makes her needs known to staff.  Safety maintained with q15 minute checks.

## 2012-07-12 NOTE — ED Provider Notes (Signed)
Medical screening examination/treatment/procedure(s) were performed by non-physician practitioner and as supervising physician I was immediately available for consultation/collaboration.  John-Adam Morgan Rennert, M.D.   John-Adam Nevada Mullett, MD 07/12/12 0447 
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
## 2012-07-12 NOTE — [facility] Group Notes (Signed)
[facility] LCSW Aftercare Discharge Planning Group Note   07/12/2012 8:13 AM  Participation Quality:  Engaged  Mood/Affect:  Depressed  Depression Rating:  7  Anxiety Rating:  8  Thoughts of Suicide:  No Will you contract for safety?   NA  Current AVH:  No  Plan for Discharge/Comments:  States she is got a bad phone call that prompted her to come here.  Became suicidal.  Admitted to also sinking into depression prior to the call.  Had already dropped courses at [employer],  dropped out of her church,  quit working out.  Tearful. Distraught.    Transportation Means: slef  Supports:  Church family  Gordon, Alamillo B

## 2012-07-13 DIAGNOSIS — F3160 Bipolar disorder, current episode mixed, unspecified: Principal | ICD-10-CM

## 2012-07-13 MED ORDER — MAGNESIUM CITRATE PO SOLN
1.0000 | Freq: Once | ORAL | Status: AC
  Administered 2012-07-13: 1 via ORAL

## 2012-07-13 MED ORDER — GABAPENTIN 300 MG PO CAPS
600.0000 mg | ORAL_CAPSULE | Freq: Three times a day (TID) | ORAL | Status: DC
  Administered 2012-07-13 – 2012-07-15 (×6): 600 mg via ORAL
  Filled 2012-07-13 (×12): qty 2

## 2012-07-13 MED ORDER — DOCUSATE SODIUM 100 MG PO CAPS: 100.0000 mg | ORAL_CAPSULE | Freq: Two times a day (BID) | ORAL | Status: DC | PRN

## 2012-07-13 MED ORDER — QUETIAPINE FUMARATE 25 MG PO TABS
25.0000 mg | ORAL_TABLET | Freq: Every day | ORAL | Status: DC
  Administered 2012-07-13 – 2012-07-14 (×4): 25 mg via ORAL
  Filled 2012-07-13 (×6): qty 1

## 2012-07-13 MED ORDER — CALCIUM POLYCARBOPHIL 625 MG PO TABS
1250.0000 mg | ORAL_TABLET | Freq: Every day | ORAL | Status: DC
  Administered 2012-07-13 – 2012-07-15 (×3): 1250 mg via ORAL
  Filled 2012-07-13 (×5): qty 2

## 2012-07-13 NOTE — Progress Notes (Signed)
[facility] MD Progress Note  07/13/2012 10:34 AM Shannon [name]  MRN:  [id number] Subjective:  "I don't want to miss my meeting.." Objective: Patient notes that she is feeling better today, speech is less pressured, mood is calmer. Diagnosis:  Bipolar I disorder, most recent episode (or current) mixed, unspecified   ADL's:  Intact  Sleep: Poor  Appetite:  Fair  Suicidal Ideation:  Lessening, no intent, no plan Homicidal Ideation:  denies AEB (as evidenced by):  Psychiatric Specialty Exam: Review of Systems  Constitutional: Negative.  Negative for fever, chills, weight loss, malaise/fatigue and diaphoresis.  HENT: Negative for congestion and sore throat.   Eyes: Negative for blurred vision, double vision and photophobia.  Respiratory: Negative for cough, shortness of breath and wheezing.   Cardiovascular: Negative for chest pain, palpitations and PND.  Gastrointestinal: Negative for heartburn, nausea, vomiting, abdominal pain, diarrhea and constipation.  Musculoskeletal: Negative for myalgias, joint pain and falls.  Neurological: Negative for dizziness, tingling, tremors, sensory change, speech change, focal weakness, seizures, loss of consciousness, weakness and headaches.  Endo/Heme/Allergies: Negative for polydipsia. Does not bruise/bleed easily.  Psychiatric/Behavioral: Negative for depression, suicidal ideas, hallucinations, memory loss and substance abuse. The patient is not nervous/anxious and does not have insomnia.     Blood pressure 111/79, pulse 105, temperature 98.5 F (36.9 C), temperature source Oral, resp. rate 20, height 5' 4.25" (1.632 m), weight 93.895 kg (207 lb).Body mass index is 35.25 kg/(m^2).  General Appearance: Casual  Eye Contact::  Fair  Speech:  Clear and Coherent  Volume:  Normal  Mood:  Anxious and Depressed  Affect:  Labile  Thought Process:  Goal Directed  Orientation:  Full (Time, Place, and Person)  Thought Content:  Rumination  Suicidal  Thoughts:  No  Homicidal Thoughts:  No  Memory:  Immediate;   Fair  Judgement:  Impaired  Insight:  Lacking  Psychomotor Activity:  Increased  Concentration:  Fair  Recall:  Fair  Akathisia:  No  Handed:  Right  AIMS (if indicated):     Assets:  Communication Skills Desire for Improvement Physical Health Resilience  Sleep:  Number of Hours: 5.25   Current Medications: Current Facility-Administered Medications  Medication Dose Route Frequency Provider Last Rate Last Dose  . acetaminophen (TYLENOL) tablet 650 mg  650 mg Oral Q6H PRN Kerry Hough, PA-C      . alum & mag hydroxide-simeth (MAALOX/MYLANTA) 200-200-20 MG/5ML suspension 30 mL  30 mL Oral Q4H PRN Kerry Hough, PA-C      . clonazePAM (KLONOPIN) tablet 0.5 mg  0.5 mg Oral TID PRN Mojeed Akintayo   0.5 mg at 07/13/12 0243  . gabapentin (NEURONTIN) capsule 600 mg  600 mg Oral TID Verne Spurr, PA-C      . hydrOXYzine (ATARAX/VISTARIL) tablet 50 mg  50 mg Oral QHS,MR X 1 [name] E Simon, PA-C   50 mg at 07/12/12 2310  . magnesium hydroxide (MILK OF MAGNESIA) suspension 30 mL  30 mL Oral Daily PRN Kerry Hough, PA-C      . nicotine polacrilex (NICORETTE) gum 2 mg  2 mg Oral PRN Mojeed Akintayo      . potassium chloride SA (K-DUR,KLOR-CON) CR tablet 20 mEq  20 mEq Oral BID Kerry Hough, PA-C   20 mEq at 07/13/12 0740  . QUEtiapine (SEROQUEL XR) 24 hr tablet 400 mg  400 mg Oral QPC supper Mojeed Akintayo   400 mg at 07/12/12 1711    Lab Results: No results found  for this or any previous visit (from the past 48 hour(s)).  Physical Findings: AIMS: Facial and Oral Movements Muscles of Facial Expression: None, normal Lips and Perioral Area: None, normal Jaw: None, normal Tongue: None, normal,Extremity Movements Upper (arms, wrists, hands, fingers): None, normal Lower (legs, knees, ankles, toes): None, normal, Trunk Movements Neck, shoulders, hips: None, normal, Overall Severity Severity of abnormal movements  (highest score from questions above): None, normal Incapacitation due to abnormal movements: None, normal Patient's awareness of abnormal movements (rate only patient's report): No Awareness, Dental Status Current problems with teeth and/or dentures?: No Does patient usually wear dentures?: No  CIWA:    COWS:     Treatment Plan Summary: Daily contact with patient to assess and evaluate symptoms and progress in treatment Medication management  Plan: 1. Continue crisis management and stabilization. 2. Medication management to reduce current symptoms to base line and improve patient's overall level of functioning 3. Treat health problems as indicated. 4. Develop treatment plan to decrease risk of relapse upon discharge and the need for readmission. 5. Psycho-social education regarding relapse prevention and self care. 6. Health care follow up as needed for medical problems. 7. Continue home medications where appropriate. 8. Gabapentin is increased to assist with anxiety. 9. Will add Seroquel 25mg  1 po at hs w/1 repeat in 1 hour for sleep. 10: ELOS: 5-7 days. 11. Stool softener and fiber for constipation per patient's request.  Medical Decision Making Problem Points:  Established problem, stable/improving (1), New problem, with no additional work-up planned (3) and Review of psycho-social stressors (1) Data Points:  Review or order medicine tests (1) Review of new medications or change in dosage (2)  I certify that inpatient services furnished can reasonably be expected to improve the patient's condition.  Rona Ravens. Dijon Cosens RPAC 1:42 PM 07/13/2012

## 2012-07-13 NOTE — Progress Notes (Signed)
Pt observed in the dayroom watching TV.  She interacts well and appropriately with her peers.  She reports she is doing well this evening.  She says she is tired and wants to go to bed a little early since she did not get restful sleep last night.  She denies SI/HI/AV.  She plans to return home at discharge.  Pt makes her needs known to staff.  Support and encouragement offered.  Safety maintained with q15 minute checks.

## 2012-07-13 NOTE — Progress Notes (Signed)
Psychoeducational Group Note  Date:  07/13/2012 Time:  1100  Group Topic/Focus:  Personal Choices and Values:   The focus of this group is to help patients assess and explore the importance of values in their lives, how their values affect their decisions, how they express their values and what opposes their expression.  Participation Level: Did Not Attend  Participation Quality:  Not Applicable  Affect:  Not Applicable  Cognitive:  Not Applicable  Insight:  Not Applicable  Engagement in Group: Not Applicable  Additional Comments:  Pt was in consult with PA during group.  Sharyn Lull 07/13/2012, 5:18 PM

## 2012-07-13 NOTE — Progress Notes (Signed)
The focus of this group is to help patients review their daily goal of treatment and discuss progress on daily workbooks. Pt attended the evening group session and responded to all discussion prompts from the Writer. Pt reported enjoying the [facility]'s Group earlier today and expressed a desire to work with them upon discharge. Pt also recounted a positive story from lunch about a fellow Pt ignoring negative comments from a peer and praised her for "rising above" someone else's bad behavior. Pt was quick to praise other patient's in group and offer supportive comments.  Despite this, Pt had to be redirected early on in group for making negative comments to a peer ("What you're saying sounds like complete bullshit to me. Why don't you just tell everyone what you've been diagnosed with and quit making stuff up?") The Writer reminded Pt that being respectful to others is a group rule, that everyone is working through something different and that only helpful comments should be voiced, which Pt brushed off. Pt's affect throughout group was appropriate.

## 2012-07-13 NOTE — Progress Notes (Signed)
D: Patient denies SI/HI and auditory and visual hallucinations. The patient has an anxious mood and affect. The patient rates her depression a 4 out of 10 and her hopelessness a 3 out of 10 (1 low/10 high). The patient reports sleeping poorly and writes that her appetite is good and that her energy level is low. The patient is also complaining of constipation.  A: Patient given emotional support from RN. Patient encouraged to come to staff with concerns and/or questions. Patient's medication routine continued. Patient's orders and plan of care reviewed. PRN milk of magnesia given for constipation.  R: Patient remains cooperative. Will continue to monitor patient q15 minutes for safety.

## 2012-07-13 NOTE — Progress Notes (Signed)
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 Patient compiled this group list by drawing a letter of the alphabet out of a container and stating an activity that started with that letter. Patients drew letters until each letter of the alphabet was identified.   Patient actively participated in group activity. Patient added activities such as Zumba and [employer] to [employer]. Patient stated she understands the need for positive recreation/leisure activities. Patient stated she is taking a vested interest in her treatment this time. patient stated she was last a patient at [facility] in 2010, patient stated last admission was for approximately 2 weeks. LRT encouraged patient to stick with the programming and attend groups. Patient stated she was doing everything she knows she needs to in order to go home.  Marykay Lex Jamiracle [employer], LRT/CTRS  Jearl Klinefelter 07/13/2012 12:50 PM

## 2012-07-13 NOTE — Clinical Social Work Note (Signed)
[facility] Group Therapy  07/13/2012 , 1:22 PM    Type of Therapy:  [facility] [employer] Presentation  Participation Level:  Active  Participation Quality:  Attentive  Affect:  Blunted  Cognitive:  Oriented  Insight:  Limited  Engagement in Therapy:  Engaged  Modes of Intervention:  Discussion, Education and Socialization  Summary of Progress/Problems:  Shannon [name] from [facility] came to present his recovery story and play the guitar.  Shannon [name] was very focused on the services offered by the [facility].  She asked specific questions on how to get linked with services and groups there.  She enjoyed the guitar playing, and thanked Shannon [name] profusely for coming.  [name] 07/13/2012 , 1:22 PM

## 2012-07-13 NOTE — [facility] Group Notes (Signed)
[facility] LCSW Aftercare Discharge Planning Group Note   07/13/2012 8:13 AM  Participation Quality:  Engaged  Mood/Affect:  Depressed  Depression Rating:  8  Anxiety Rating:  8  Thoughts of Suicide:  No Will you contract for safety?   NA  Current AVH:  No  Plan for Discharge/Comments:  Graceyn thinks she may be making a bit of progress over yesterday.  Says she slept a bit better last night than the night before, but still not good.  Wonders about medication adjustment for this.  Told her I would pass this on to Dr.  [employer]: vehicle  Supports: neighbor, daughter  Ida Rogue

## 2012-07-14 DIAGNOSIS — F3160 Bipolar disorder, current episode mixed, unspecified: Secondary | ICD-10-CM | POA: Diagnosis not present

## 2012-07-14 MED ORDER — GABAPENTIN 300 MG PO CAPS: 600.0000 mg | ORAL_CAPSULE | Freq: Three times a day (TID) | ORAL | Status: DC

## 2012-07-14 MED ORDER — CLONAZEPAM 1 MG PO TABS: 1.0000 mg | ORAL_TABLET | Freq: Three times a day (TID) | ORAL | Status: DC

## 2012-07-14 MED ORDER — QUETIAPINE FUMARATE ER 400 MG PO TB24: 400.0000 mg | ORAL_TABLET | Freq: Every day | ORAL | Status: DC

## 2012-07-14 MED ORDER — QUETIAPINE FUMARATE 25 MG PO TABS: 50.0000 mg | ORAL_TABLET | Freq: Every day | ORAL | Status: DC

## 2012-07-14 NOTE — Discharge Summary (Signed)
Physician Discharge Summary Note  Patient:  Shannon [name] is an 47 y.o., female MRN:  [id number] DOB:  1965-05-14 Patient phone:  [phone] (home)  Patient address:   [address] [place] [address],   Date of Admission:  07/12/2012 Date of Discharge:  07/15/2012  Reason for Admission:  Mania  Discharge Diagnoses: Principal Problem:   Bipolar I disorder, most recent episode (or current) mixed, unspecified  Review of Systems  Constitutional: Negative.  Negative for fever, chills, weight loss, malaise/fatigue and diaphoresis.  HENT: Negative for congestion and sore throat.   Eyes: Negative for blurred vision, double vision and photophobia.  Respiratory: Negative for cough, shortness of breath and wheezing.   Cardiovascular: Negative for chest pain, palpitations and PND.  Gastrointestinal: Negative for heartburn, nausea, vomiting, abdominal pain, diarrhea and constipation.  Musculoskeletal: Negative for myalgias, joint pain and falls.  Neurological: Negative for dizziness, tingling, tremors, sensory change, speech change, focal weakness, seizures, loss of consciousness, weakness and headaches.  Endo/Heme/Allergies: Negative for polydipsia. Does not bruise/bleed easily.  Psychiatric/Behavioral: Negative for depression, suicidal ideas, hallucinations, memory loss and substance abuse. The patient is not nervous/anxious and does not have insomnia.   Discharge Diagnoses:  AXIS I: Bipolar I disorder, most recent episode (or current) mixed, unspecified  AXIS II: Deferred  AXIS III:  Past Medical History   Diagnosis  Date   .  ANEMIA-IRON DEFICIENCY  01/25/2009   .  HYPERLIPIDEMIA  01/25/2009   .  Morbid obesity  01/25/2009   .  DISC DISEASE, LUMBAR  01/25/2009   .  PEPTIC ULCER DISEASE, HELICOBACTER PYLORI POSITIVE  10/03/2009   .  HYPERTENSION  01/25/2009   .  Chronic pain syndrome    .  Allergic rhinitis    .  Night sweats    .  Contact lens/glasses fitting    .  MANIC  DEPRESSIVE ILLNESS  01/25/2009     pt is unsue if this is her specifc dx   .  Bipolar 1 disorder     AXIS IV: economic problems, other psychosocial or environmental problems and problems related to social environment  AXIS V: 61-70 mild symptoms  Level of Care:  OP  Hospital Course:  Shannon [name] was admitted to [facility] after coming to the [facility] reporting suicidal ideation with a plan to OD on medication. She reported having been off of her Seroquel for some time and that the symptoms of her mental illness had returned. She was given medical clearance and transferred to [facility] for further stabilization and care.    Upon arrival Shannon [name] was manic with circumstantial pressured speech and a flight of ideas. She was resistant to Lithium and argued vehemently against considering it. She was started on Seroquel at her request stating that it has always worked well for her in the past and she wanted to stay on this. Shannon [name] also noted that she had not been sleeping well prior to coming in to the the hospital and wanted to use Ambien. She was given Seroquel 25mg  po of IR tablets for sedation and reported improved sleep with this.     Shannon [name] noted improvement with 50% symptom reduction with in the first 24 hours of admission. The next day she was less pressured with her speech, and could maintain a conversation. She was evaluated each day by a clinical provider to assess her response to treatment.    On the 23rd of May Shannon [name] was ready to be discharged feeling much better. She denied  SI/HI and reported no AVH. She was in much improved condition than upon admission and felt to be stable for discharge.  Consults:  None  Significant Diagnostic Studies:  None  Discharge Vitals:   Blood pressure 124/82, pulse 92, temperature 98 F (36.7 C), temperature source Oral, resp. rate 20, height 5' 4.25" (1.632 m), weight 93.895 kg (207 lb). Body mass index is 35.25 kg/(m^2). Lab Results:   No results found for this or any  previous visit (from the past 72 hour(s)).  Physical Findings: AIMS: Facial and Oral Movements Muscles of Facial Expression: None, normal Lips and Perioral Area: None, normal Jaw: None, normal Tongue: None, normal,Extremity Movements Upper (arms, wrists, hands, fingers): None, normal Lower (legs, knees, ankles, toes): None, normal, Trunk Movements Neck, shoulders, hips: None, normal, Overall Severity Severity of abnormal movements (highest score from questions above): None, normal Incapacitation due to abnormal movements: None, normal Patient's awareness of abnormal movements (rate only patient's report): No Awareness, Dental Status Current problems with teeth and/or dentures?: No Does patient usually wear dentures?: No  CIWA:    COWS:     Psychiatric Specialty Exam: See Psychiatric Specialty Exam and Suicide Risk Assessment completed by Attending Physician prior to discharge.  Discharge destination:  Home  Is patient on multiple antipsychotic therapies at discharge:  No   Has Patient had three or more failed trials of antipsychotic monotherapy by history:  No  Recommended Plan for Multiple Antipsychotic Therapies:  Not applicable   Future Appointments Provider Department Dept Phone   07/28/2012 11:30 AM [facility] [place] [phone]       Medication List    TAKE these medications     Indication   clonazePAM 1 MG tablet  Commonly known as:  KLONOPIN  Take 1 tablet (1 mg total) by mouth 3 (three) times daily.   Indication:  Manic-Depression, Panic Disorder     gabapentin 300 MG capsule  Commonly known as:  NEURONTIN  Take 2 capsules (600 mg total) by mouth 3 (three) times daily. For agitation and anxiety.   Indication:  Agitation     QUEtiapine 400 MG 24 hr tablet  Commonly known as:  SEROQUEL XR  Take 1 tablet (400 mg total) by mouth daily after supper. For bipolar disorder and mood lability.   Indication:  Manic-Depression, Major  Depressive Disorder     QUEtiapine 25 MG tablet  Commonly known as:  SEROQUEL  Take 2 tablets (50 mg total) by mouth at bedtime. 2 tablets at bedtime   Indication:  Trouble Sleeping           Follow-up Information   Follow up with [employer] [facility].   Contact information:   [address] [facility]  [place]  [phone]      Follow-up recommendations:   Activities: Resume activity as tolerated. Diet: Heart healthy low sodium diet Tests: Follow up testing will be determined by your out patient provider. Comments:    Total Discharge Time:  Greater than 30 minutes.  Signed: Rona Ravens. Iysis Germain RPAC 2:27 PM 07/18/2012

## 2012-07-14 NOTE — Progress Notes (Signed)
Adult Psychoeducational Group Note  Date:  07/14/2012 Time: 0930 Group Topic/Focus:  Rediscovering Joy:   The focus of this group is to explore various ways to relieve stress in a positive manner.  Participation Level:  Active  Participation Quality:  Appropriate  Affect:  Appropriate  Cognitive:  Appropriate  Insight: Improving  Engagement in Group:  Engaged  Modes of Intervention:  Discussion and Education  Additional Comments:  Pt participated actively in group this morning.  Thoren Hosang E 07/14/2012, 11:02 AM

## 2012-07-14 NOTE — Progress Notes (Signed)
D: Patient denies SI/HI and auditory and visual hallucinations. The patient has an anxious mood and affect. The patient rates her depression a 4 out of 10 and her hopelessness a 2 out of 10 (1 low/10 high). The patient reports sleeping poorly and writes that her appetite is good and that her energy level is normal. The patient reports that the mag citrate given yesterday helped with her constipation. The patient also states that she feels like she is doing "a little better."  A: Patient given emotional support from RN. Patient encouraged to come to staff with concerns and/or questions. Patient's medication routine continued. Patient's orders and plan of care reviewed.  R: Patient remains cooperative. Will continue to monitor patient q15 minutes for safety.

## 2012-07-14 NOTE — Progress Notes (Signed)
Patient ID: Shannon [name], female   DOB: Feb 16, 1966, 47 y.o.   MRN: [id number]  D: Pt denies SI/HI/AVH. Pt is pleasant and cooperative. Pt concerned about sleep, "I haven't slept since I've gotten here " " I'm ready to go, I set up things to do when I get out". Pt went to [employer]. Pt denies pain at this time.  A: Pt was offered support and encouragement. Pt was given scheduled medications. Pt was encourage to attend groups. Q 15 minute checks were done for safety.   R:Pt attends groups and interacts well with peers and staff. Pt is taking medication. Pt  Insomnia is the only complaint at this time.Pt receptive to treatment and safety maintained on unit.

## 2012-07-14 NOTE — Clinical Social Work Note (Signed)
[facility] Group Notes:  (Counselor/Nursing/MHT/Case Management/Adjunct)  01/07/2012 2:20 PM  Type of Therapy:  Group Therapy  Participation Level:  Active  Participation Quality:  Attentive  Affect:  Appropriate  Cognitive:  Oriented  Insight:  Improving  Engagement in Group:  Engaged  Engagement in Therapy:  Improving  Modes of Intervention:  Discussion, Exploration and Socialization  Summary of Progress/Problems: .balance: The topic for group was balance in life.  Pt participated in the discussion about when their life was in balance and out of balance and how this feels.  Pt discussed ways to get back in balance and short term goals they can work on to get where they want to be.  K stated she is feeling balanced, and then went into a rehash of what was going on prior to admission that led to crises, and what was helpful here.  It was difficult for her to really figure out what helped, but eventually she decided it was a combination of meds and using groups.  Plans to follow up with groups outside of here.   [name] 01/07/2012, 2:20 PM

## 2012-07-14 NOTE — Progress Notes (Signed)
Patient ID: Shannon [name], female   DOB: [birth year], 47 y.o.   MRN: [id number] [facility] MD Progress Note  07/14/2012 10:22 AM Shannon [name]  MRN:  [id number] Subjective:  "I am feeling good , I thinks my medication is working well." Objective: Patient reports that she is sleeping much better and  doing really well on her current medication regimen. She denies suicidal/homicidal ideation, intent or plan. Endorsed decreased mood swings, depressive and anxiety symptoms. She is compliant with her medications and has not reported any adverse reactions to them.  Diagnosis:  Bipolar I disorder, most recent episode (or current) mixed, unspecified   ADL's:  Intact  Sleep: good  Appetite:  Fair  Suicidal Ideation:  Lessening, no intent, no plan Homicidal Ideation:  denies AEB (as evidenced by):  Psychiatric Specialty Exam: Review of Systems  Constitutional: Negative.  Negative for fever, chills, weight loss, malaise/fatigue and diaphoresis.  HENT: Negative for congestion and sore throat.   Eyes: Negative for blurred vision, double vision and photophobia.  Respiratory: Negative for cough, shortness of breath and wheezing.   Cardiovascular: Negative for chest pain, palpitations and PND.  Gastrointestinal: Negative for heartburn, nausea, vomiting, abdominal pain, diarrhea and constipation.  Musculoskeletal: Negative for myalgias, joint pain and falls.  Neurological: Negative for dizziness, tingling, tremors, sensory change, speech change, focal weakness, seizures, loss of consciousness, weakness and headaches.  Endo/Heme/Allergies: Negative for polydipsia. Does not bruise/bleed easily.  Psychiatric/Behavioral: Negative for depression, suicidal ideas, hallucinations, memory loss and substance abuse. The patient is not nervous/anxious and does not have insomnia.     Blood pressure 111/79, pulse 105, temperature 98.5 F (36.9 C), temperature source Oral, resp. rate 20, height 5' 4.25" (1.632 m),  weight 93.895 kg (207 lb).Body mass index is 35.25 kg/(m^2).  General Appearance: Casual  Eye Contact::  Fair  Speech:  Clear and Coherent  Volume:  Normal  Mood:  Less irritable  Affect:  Labile  Thought Process:  Goal Directed  Orientation:  Full (Time, Place, and Person)  Thought Content:  Reality based  Suicidal Thoughts:  No  Homicidal Thoughts:  No  Memory:  Immediate;   Fair  Judgement:  marginal  Insight:  fair  Psychomotor Activity:  normal  Concentration:  Fair  Recall:  Fair  Akathisia:  No  Handed:  Right  AIMS (if indicated):     Assets:  Communication Skills Desire for Improvement Physical Health Resilience  Sleep:  Number of Hours: 6.25   Current Medications: Current Facility-Administered Medications  Medication Dose Route Frequency Provider Last Rate Last Dose  . acetaminophen (TYLENOL) tablet 650 mg  650 mg Oral Q6H PRN Kerry Hough, PA-C      . alum & mag hydroxide-simeth (MAALOX/MYLANTA) 200-200-20 MG/5ML suspension 30 mL  30 mL Oral Q4H PRN Kerry Hough, PA-C      . clonazePAM (KLONOPIN) tablet 0.5 mg  0.5 mg Oral TID PRN Tytan Sandate   0.5 mg at 07/13/12 0243  . gabapentin (NEURONTIN) capsule 600 mg  600 mg Oral TID Verne Spurr, PA-C      . hydrOXYzine (ATARAX/VISTARIL) tablet 50 mg  50 mg Oral QHS,MR X 1 [name] E Simon, PA-C   50 mg at 07/12/12 2310  . magnesium hydroxide (MILK OF MAGNESIA) suspension 30 mL  30 mL Oral Daily PRN Kerry Hough, PA-C      . nicotine polacrilex (NICORETTE) gum 2 mg  2 mg Oral PRN Trinitee Horgan      . potassium chloride  SA (K-DUR,KLOR-CON) CR tablet 20 mEq  20 mEq Oral BID Kerry Hough, PA-C   20 mEq at 07/13/12 0740  . QUEtiapine (SEROQUEL XR) 24 hr tablet 400 mg  400 mg Oral QPC supper Jamaul Heist   400 mg at 07/12/12 1711    Lab Results: No results found for this or any previous visit (from the past 48 hour(s)).  Physical Findings: AIMS: Facial and Oral Movements Muscles of Facial Expression:  None, normal Lips and Perioral Area: None, normal Jaw: None, normal Tongue: None, normal,Extremity Movements Upper (arms, wrists, hands, fingers): None, normal Lower (legs, knees, ankles, toes): None, normal, Trunk Movements Neck, shoulders, hips: None, normal, Overall Severity Severity of abnormal movements (highest score from questions above): None, normal Incapacitation due to abnormal movements: None, normal Patient's awareness of abnormal movements (rate only patient's report): No Awareness, Dental Status Current problems with teeth and/or dentures?: No Does patient usually wear dentures?: No  CIWA:    COWS:     Treatment Plan Summary: Daily contact with patient to assess and evaluate symptoms and progress in treatment Medication management  Plan: 1. Continue crisis management and stabilization. 2. Medication management to reduce current symptoms to base line and improve patient's overall level of functioning 3. Treat health problems as indicated. 4. Develop treatment plan to decrease risk of relapse upon discharge and the need for readmission. 5. Psycho-social education regarding relapse prevention and self care. 6. Health care follow up as needed for medical problems. 7. Continue home medications where appropriate. 8. Gabapentin is increased to assist with anxiety. 9. Will continue Seroquel 25mg  1 tablet po Qhs for Insomnia. 10. Possible discharge tomorrow.  Medical Decision Making Problem Points:  Established problem, stable/improving (1), New problem, with no additional work-up planned (3) and Review of psycho-social stressors (1) Data Points:  Review or order medicine tests (1) Review of new medications or change in dosage (2)  I certify that inpatient services furnished can reasonably be expected to improve the patient's condition.  Thedore Mins, MD 10:22 AM 07/14/2012

## 2012-07-14 NOTE — [facility] Suicide Risk Assessment (Signed)
[facility] INPATIENT:  Family/Significant Other Suicide Prevention Education  Suicide Prevention Education:  Education Completed; Forrest Moron, neighbor, [phone] has been identified by the patient as the family member/significant other with whom the patient will be residing, and identified as the person(s) who will aid the patient in the event of a mental health crisis (suicidal ideations/suicide attempt).  With written consent from the patient, the family member/significant other has been provided the following suicide prevention education, prior to the and/or following the discharge of the patient.  The suicide prevention education provided includes the following:  Suicide risk factors  Suicide prevention and interventions  National Suicide Hotline telephone number  [facility] assessment telephone number  [facility] Emergency Assistance 911  [facility] and/or Residential Mobile Crisis Unit telephone number  Request made of family/significant other to:  Remove weapons (e.g., guns, rifles, knives), all items previously/currently identified as safety concern.    Remove drugs/medications (over-the-counter, prescriptions, illicit drugs), all items previously/currently identified as a safety concern.  The family member/significant other verbalizes understanding of the suicide prevention education information provided.  The family member/significant other agrees to remove the items of safety concern listed above.  [name] 07/14/2012, 4:05 PM

## 2012-07-14 NOTE — Progress Notes (Signed)
Pt came up to the med window around midnight specifically asking for her repeat seroquel 25 mg and clonazepam.  Informed pt that she also had a repeat Vistaril 50mg  and should try it and the seroquel 25mg  to see if she could go to sleep.   This Clinical research associate tried to discourage pt from taking the clonazepam.  Pt was visibly annoyed by this, but agreed to writer's suggestion, taking the medication and going back to bed.  However, just a few moments ago, @ 0150, pt is back up at the window asking for the clonazepam.  She says she is not able to sleep and every little sound wakes her if she does doze off.  She became verbally hostile with Clinical research associate, saying Clinical research associate was not listening to her.  This Clinical research associate repeated back to pt what pt had been saying.  Pt then became focused on the colors of the medications she had been given.  Explained to pt that medications are manufactured by different companies and are different colors, but the same medication.  Pt was not satisfied with writer's answer, but she took the medication and went back to bed.  Pt continues to be monitored q15 minutes for safety.

## 2012-07-14 NOTE — BHH Group Notes (Signed)
[facility] LCSW Aftercare Discharge Planning Group Note   07/14/2012 9:33 AM  Participation Quality:  Engaged  Mood/Affect:  Appropriate  Depression Rating:  4  Anxiety Rating:  4  Thoughts of Suicide:  No Will you contract for safety?   NA  Current AVH:  No  Plan for Discharge/Comments:  States she is feeling much better, and explained that yesterday and today she has been working on follow up appointments.  Plans to see psychiatrist at [facility] and switch PCPs, as well as go to [facility].  She already set up an appointment there.  Likely d/c tomorrow.  Transportation Means: car  Supports: friends, family  [place], [place] B

## 2012-07-14 NOTE — Progress Notes (Signed)
Adult Psychoeducational Group Note  Date:  07/14/2012 Time:  11:20 PM  Group Topic/Focus:  Karaoke  Participation Level:  Active  Participation Quality:  Appropriate  Affect:  Appropriate  Cognitive:  Alert  Insight: Appropriate  Engagement in Group:  Engaged  Modes of Intervention:  Activity  Additional Comments:    Flonnie Hailstone 07/14/2012, 11:20 PM

## 2012-07-15 DIAGNOSIS — F3160 Bipolar disorder, current episode mixed, unspecified: Secondary | ICD-10-CM | POA: Diagnosis not present

## 2012-07-15 LAB — BASIC METABOLIC PANEL
BUN: 7 mg/dL (ref 6–23)
CO2: 22 mEq/L (ref 19–32)
Calcium: 8.3 mg/dL — ABNORMAL LOW (ref 8.4–10.5)
Chloride: 105 mEq/L (ref 96–112)
Creatinine, Ser: 0.59 mg/dL (ref 0.50–1.10)
GFR calc Af Amer: >90 mL/min (ref >90)
GFR calc non Af Amer: >90 mL/min (ref >90)
Glucose, Bld: 131 mg/dL — ABNORMAL HIGH (ref 70–99)
Potassium: 3.6 mEq/L (ref 3.5–5.1)
Sodium: 137 mEq/L (ref 135–145)

## 2012-07-15 MED ORDER — QUETIAPINE FUMARATE 50 MG PO TABS
50.0000 mg | ORAL_TABLET | Freq: Every day | ORAL | Status: DC
  Filled 2012-07-15: qty 3

## 2012-07-15 MED ORDER — GABAPENTIN 600 MG PO TABS
600.0000 mg | ORAL_TABLET | Freq: Three times a day (TID) | ORAL | Status: DC
Start: 2012-07-15 — End: 2012-07-15
  Filled 2012-07-15: qty 42
  Filled 2012-07-15: qty 9

## 2012-07-15 NOTE — Progress Notes (Signed)
Pt received 100 mg vistaril, 50 mg Seroquel, and 0.5 klonopin, pt states she got the best sleep since she has been here, pt slept a total of  5.5 hrs last night.

## 2012-07-15 NOTE — Tx Team (Signed)
  Interdisciplinary Treatment Plan Update   Date Reviewed:  07/15/2012  Time Reviewed:  8:20 AM  Progress in Treatment:   Attending groups: Yes Participating in groups: Yes Taking medication as prescribed: Yes  Tolerating medication: Yes Family/Significant other contact made: Yes  Patient understands diagnosis: Yes  Discussing patient identified problems/goals with staff: Yes Medical problems stabilized or resolved: Yes Denies suicidal/homicidal ideation: Yes  In tx team Patient has not harmed self or others: Yes  For review of initial/current patient goals, please see plan of care.  Estimated Length of Stay:  D/C today  Reason for Continuation of Hospitalization:   New Problems/Goals identified:  N/A  Discharge Plan or Barriers: return home, follow up outpt  Additional Comments:  Attendees:  Signature: Thedore Mins, MD 07/15/2012 8:20 AM   Signature: Richelle Ito, LCSW 07/15/2012 8:20 AM  Signature: Verne Spurr, PA 07/15/2012 8:20 AM  Signature: Nestor Ramp, RN 07/15/2012 8:20 AM  Signature:  07/15/2012 8:20 AM  Signature:  07/15/2012 8:20 AM  Signature:   07/15/2012 8:20 AM  Signature:    Signature:    Signature:    Signature:    Signature:    Signature:      Scribe for Treatment Team:   Richelle Ito, LCSW  07/15/2012 8:20 AM

## 2012-07-15 NOTE — Progress Notes (Signed)
Pt discharged per MD orders; pt currently denies SI/HI and auditory/visual hallucinations; pt was given education by RN regarding follow-up appointments and medications and pt denied any questions or concerns about these instructions; pt was then escorted to search room to retrieve her belongings by RN before being discharged to hospital lobby. 

## 2012-07-15 NOTE — Progress Notes (Signed)
[name] :  Will you be returning to the same living situation after discharge: Yes,  home At discharge, do you have transportation home?:Yes,  friend Do you have the ability to pay for your medications:Yes,  MCD  Release of information consent forms completed and in the chart;  Patient's signature needed at discharge.  Patient to Follow up at: Follow-up Information   Follow up with [facility]. (I faxed your information and have been trying to get you a follow up appointment for 2 days.  You will need to call them when you get home.)    Contact information:   [address] [facility]  [place]  [phone]      Patient denies SI/HI:   Yes,  yes    Safety Planning and Suicide Prevention discussed:  Yes,  yes  Shannon [name] 07/15/2012, 10:24 AM

## 2012-07-15 NOTE — [facility] Suicide Risk Assessment (Signed)
Suicide Risk Assessment  Discharge Assessment     Demographic Factors:  Low socioeconomic status, Living alone, Unemployed and female  Mental Status Per Nursing Assessment::   On Admission:   (recent thoughts of SI)  Current Mental Status by Physician: patient currently denies suicidal ideation, inten or plan  Loss Factors: Decrease in vocational status and Financial problems/change in socioeconomic status  Historical Factors: Impulsivity  Risk Reduction Factors:   Religious beliefs about death, Positive social support and Positive therapeutic relationship  Continued Clinical Symptoms:  Bipolar Disorder:   Mixed State  Cognitive Features That Contribute To Risk:  Closed-mindedness Polarized thinking    Suicide Risk:  Minimal: No identifiable suicidal ideation.  Patients presenting with no risk factors but with morbid ruminations; may be classified as minimal risk based on the severity of the depressive symptoms  Discharge Diagnoses:   AXIS I:  Bipolar I disorder, most recent episode (or current) mixed, unspecified  AXIS II:  Deferred AXIS III:   Past Medical History  Diagnosis Date  . ANEMIA-IRON DEFICIENCY 01/25/2009  . HYPERLIPIDEMIA 01/25/2009  . Morbid obesity 01/25/2009  . DISC DISEASE, LUMBAR 01/25/2009  . PEPTIC ULCER DISEASE, HELICOBACTER PYLORI POSITIVE 10/03/2009  . HYPERTENSION 01/25/2009  . Chronic pain syndrome   . Allergic rhinitis   . Night sweats   . Contact lens/glasses fitting   . MANIC DEPRESSIVE ILLNESS 01/25/2009    pt is unsue if this is her specifc dx  . Bipolar 1 disorder    AXIS IV:  economic problems, other psychosocial or environmental problems and problems related to social environment AXIS V:  61-70 mild symptoms  Plan Of Care/Follow-up recommendations:  Activity:  as tolerated Diet:  healthy Tests:  routine blood test Other:  patient to keep her after care appointment  Is patient on multiple antipsychotic therapies at discharge:   No   Has Patient had three or more failed trials of antipsychotic monotherapy by history:  No  Recommended Plan for Multiple Antipsychotic Therapies: N/A  Arhianna Ebey,MD 07/15/2012, 9:03 AM

## 2012-07-19 NOTE — Progress Notes (Signed)
Patient Discharge Instructions:  After Visit Summary (AVS):   Faxed to:  07/19/12 Discharge Summary Note:   Faxed to:  07/19/12 Psychiatric Admission Assessment Note:   Faxed to:  07/19/12 Suicide Risk Assessment - Discharge Assessment:   Faxed to:  07/19/12 Faxed/Sent to the Next Level Care provider:  07/19/12 Faxed to [facility] @ [phone]  Jerelene Redden, 07/19/2012, 3:55 PM

## 2012-07-19 NOTE — Discharge Summary (Signed)
Seen and agreed. Jaben Benegas, MD 

## 2012-07-27 ENCOUNTER — Other Ambulatory Visit: Payer: Self-pay | Admitting: Internal Medicine

## 2012-07-28 ENCOUNTER — Encounter (INDEPENDENT_AMBULATORY_CARE_PROVIDER_SITE_OTHER): Payer: Medicare Other

## 2012-08-15 ENCOUNTER — Telehealth (INDEPENDENT_AMBULATORY_CARE_PROVIDER_SITE_OTHER): Payer: Self-pay

## 2012-08-15 NOTE — Telephone Encounter (Signed)
Patient states she is having frequent reflux. She states she can not drink water without it coming back up. Appointment moved from 08/25/12 to 08/18/12 2 10  am  with lap band clinic/Andy   patient aware

## 2012-08-18 ENCOUNTER — Encounter (INDEPENDENT_AMBULATORY_CARE_PROVIDER_SITE_OTHER): Payer: Medicare Other

## 2012-08-25 ENCOUNTER — Encounter (INDEPENDENT_AMBULATORY_CARE_PROVIDER_SITE_OTHER): Payer: Medicare Other

## 2012-08-25 ENCOUNTER — Encounter (INDEPENDENT_AMBULATORY_CARE_PROVIDER_SITE_OTHER): Payer: Self-pay

## 2012-08-25 ENCOUNTER — Ambulatory Visit (INDEPENDENT_AMBULATORY_CARE_PROVIDER_SITE_OTHER): Payer: PRIVATE HEALTH INSURANCE | Admitting: Physician Assistant

## 2012-08-25 DIAGNOSIS — Z9884 Bariatric surgery status: Secondary | ICD-10-CM

## 2012-08-25 DIAGNOSIS — K219 Gastro-esophageal reflux disease without esophagitis: Secondary | ICD-10-CM

## 2012-08-25 MED ORDER — ESOMEPRAZOLE MAGNESIUM 20 MG PO CPDR: 20.0000 mg | DELAYED_RELEASE_CAPSULE | Freq: Every day | ORAL | Status: DC

## 2012-08-25 NOTE — Progress Notes (Signed)
  HISTORY: Shannon [name] is a 47 y.o.female who received an AP-Large lap-band in May 2012 by Dr. Ezzard Standing. She comes in with close to 6 lbs weight gain since her last visit in March. She reports an increase in reflux in the past month with no known trigger. She has been trying to eat bread as well, which is poorly tolerated (as expected). She says that sometimes she has reflux as a result of drinking water. For the most part, she can swallow solid foods. She can take her medications as well but has occasional difficulty with her potassium supplement.  VITAL SIGNS: Filed Vitals:   08/25/12 0857  BP: 127/77  Pulse: 81  Temp: 98.6 F (37 C)  Resp: 14    PHYSICAL EXAM: Physical exam reveals a very well-appearing 47 y.o.female in no apparent distress Neurologic: Awake, alert, oriented Psych: Bright affect, conversant Respiratory: Breathing even and unlabored. No stridor or wheezing Extremities: Atraumatic, good range of motion. Skin: Warm, Dry, no rashes Musculoskeletal: Normal gait, Joints normal  ASSESMENT: 47 y.o.  female  s/p AP-Large lap-band with symptomatic GERD.   PLAN: We discussed the need to remove a small amount of fluid as I'm concerned that this is contributing to her reflux. She adamantly refused, despite hearing the risks of aspiration pneumonitis as a result of night cough as well as esophageal injury. Over-restriction may also be contributing to her weight gain as a result of maladaptive eating. She voiced understanding of these risks but still refused to have fluid removed. I prescribed nexium which will hopefully ameliorate her symptoms. I've asked her to return in two weeks or to return sooner should her symptoms worsen. She voiced understanding and agreement.

## 2012-08-25 NOTE — Patient Instructions (Signed)
Return in two weeks. Focus on good food choices as well as physical activity. Return sooner if you have an increase in hunger, portion sizes or weight. Return also for difficulty swallowing, night cough, reflux. Take nexium as prescribed. Return in two weeks. Return sooner if your reflux doesn't improve or if it worsens. Persistent reflux can cause problems with your esophagus and risks pneumonia due to night cough.

## 2012-09-08 ENCOUNTER — Encounter (INDEPENDENT_AMBULATORY_CARE_PROVIDER_SITE_OTHER): Payer: Self-pay

## 2012-09-08 ENCOUNTER — Ambulatory Visit (INDEPENDENT_AMBULATORY_CARE_PROVIDER_SITE_OTHER): Payer: PRIVATE HEALTH INSURANCE | Admitting: Physician Assistant

## 2012-09-08 DIAGNOSIS — Z4651 Encounter for fitting and adjustment of gastric lap band: Secondary | ICD-10-CM

## 2012-09-08 NOTE — Patient Instructions (Signed)

## 2012-09-08 NOTE — Progress Notes (Signed)
  HISTORY: Shannon [name] is a 47 y.o.female who received an AP-Large lap-band in May 2012 by Dr. Ezzard Standing. She comes in with 7 lbs weight gain but fortunately no further complaints of reflux. She reports having [place] from [place] who [place] cigarettes, which she began smoking. She noticed that once she ceased smoking them, her reflux totally resolved, even with discontinuing nexium. She is noticing now an increase in her hunger and portion size, as well as weight gain. She would like a fill today.  VITAL SIGNS: Filed Vitals:   09/08/12 0939  BP: 132/88  Pulse: 98  Temp: 97.6 F (36.4 C)  Resp: 16    PHYSICAL EXAM: Physical exam reveals a very well-appearing 47 y.o.female in no apparent distress Neurologic: Awake, alert, oriented Psych: Bright affect, conversant Respiratory: Breathing even and unlabored. No stridor or wheezing Abdomen: Soft, nontender, nondistended to palpation. Incisions well-healed. No incisional hernias. Port easily palpated. Extremities: Atraumatic, good range of motion.  ASSESMENT: 47 y.o.  female  s/p AP-Large lap-band.   PLAN: The patient's port was accessed with a 20G Huber needle without difficulty. Clear fluid was aspirated and 0.5 mL saline was added to the port to give a total predicted volume of 9 mL. The patient was able to swallow water without difficulty following the procedure and was instructed to take clear liquids for the next 24-48 hours and advance slowly as tolerated.

## 2012-09-15 ENCOUNTER — Encounter (INDEPENDENT_AMBULATORY_CARE_PROVIDER_SITE_OTHER): Payer: Self-pay

## 2012-09-15 ENCOUNTER — Ambulatory Visit (INDEPENDENT_AMBULATORY_CARE_PROVIDER_SITE_OTHER): Payer: PRIVATE HEALTH INSURANCE | Admitting: Physician Assistant

## 2012-09-15 VITALS — BP 132/84 | HR 102 | Temp 98.1°F | Resp 18 | Ht 63.0 in | Wt 211.4 lb

## 2012-09-15 DIAGNOSIS — Z4651 Encounter for fitting and adjustment of gastric lap band: Secondary | ICD-10-CM

## 2012-09-15 MED ORDER — OMEPRAZOLE 20 MG PO CPDR: 20.0000 mg | DELAYED_RELEASE_CAPSULE | Freq: Every day | ORAL | Status: DC

## 2012-09-15 NOTE — Patient Instructions (Signed)
Return in one week. Focus on good food choices as well as physical activity. Return sooner if you have an increase in hunger, portion sizes or weight. Return also for difficulty swallowing, night cough, reflux. Take omeprazole as prescribed.

## 2012-09-15 NOTE — Progress Notes (Signed)
  HISTORY: Shannon [name] is a 47 y.o.female who received an AP-Large lap-band in May 2012 by Dr. Ezzard Standing. She comes in with obstruction since the day after her fill. She was doing fine on the evening of her fill but the next morning she attempted to take her medication but the pills got stuck. Since then she's been unable to tolerate solids and she's able to only sip water. She does complain of some diarrhea as well.  VITAL SIGNS: Filed Vitals:   09/15/12 0941  BP: 132/84  Pulse: 102  Temp: 98.1 F (36.7 C)  Resp: 18    PHYSICAL EXAM: Physical exam reveals a very well-appearing 47 y.o.female in no apparent distress Neurologic: Awake, alert, oriented Psych: Bright affect, conversant Respiratory: Breathing even and unlabored. No stridor or wheezing Abdomen: Soft, nontender, nondistended to palpation. Incisions well-healed. No incisional hernias. Port easily palpated. Extremities: Atraumatic, good range of motion.  ASSESMENT: 47 y.o.  female  s/p AP-Large lap-band.   PLAN: The patient's port was accessed with a 20G Huber needle without difficulty. Clear fluid was aspirated and 2 mL saline was removed from the port to give a total predicted volume of 7 mL. The patient was advised to concentrate on healthy food choices and to avoid slider foods high in fats and carbohydrates. I called in omeprazole to her pharmacy. We'll see her back next week to get her back on track with a fill.

## 2012-09-22 ENCOUNTER — Encounter (INDEPENDENT_AMBULATORY_CARE_PROVIDER_SITE_OTHER): Payer: Self-pay

## 2012-09-22 ENCOUNTER — Ambulatory Visit (INDEPENDENT_AMBULATORY_CARE_PROVIDER_SITE_OTHER): Payer: PRIVATE HEALTH INSURANCE | Admitting: Physician Assistant

## 2012-09-22 VITALS — BP 120/86 | HR 80 | Resp 14 | Ht 63.0 in | Wt 218.2 lb

## 2012-09-22 DIAGNOSIS — Z4651 Encounter for fitting and adjustment of gastric lap band: Secondary | ICD-10-CM

## 2012-09-22 NOTE — Progress Notes (Signed)
  HISTORY: Shannon [name] is a 47 y.o.female who received an AP-Large lap-band in May 2012 by Dr. Ezzard Standing. She comes in with no further obstructive symptoms for which 2 mL fluid was removed last week. She's gained close to 7 lbs as a result. She's back for a fill today as recommended last week.  VITAL SIGNS: Filed Vitals:   09/22/12 0912  BP: 120/86  Pulse: 80  Resp: 14    PHYSICAL EXAM: Physical exam reveals a very well-appearing 47 y.o.female in no apparent distress Neurologic: Awake, alert, oriented Psych: Bright affect, conversant Respiratory: Breathing even and unlabored. No stridor or wheezing Abdomen: Soft, nontender, nondistended to palpation. Incisions well-healed. No incisional hernias. Port easily palpated. Extremities: Atraumatic, good range of motion.  ASSESMENT: 47 y.o.  female  s/p AP-Large lap-band.   PLAN: The patient's port was accessed with a 20G Huber needle without difficulty. Clear fluid was aspirated and 1.5 mL saline was added to the port to give a total predicted volume of 8.5 mL. This is 0.5 mL shy of the volume that gave her difficulty a couple of weeks ago. The patient was able to swallow water without difficulty following the procedure and was instructed to take clear liquids for the next 24-48 hours and advance slowly as tolerated.

## 2012-09-22 NOTE — Patient Instructions (Signed)

## 2012-10-26 ENCOUNTER — Encounter: Payer: Self-pay | Admitting: Internal Medicine

## 2012-10-26 ENCOUNTER — Other Ambulatory Visit (INDEPENDENT_AMBULATORY_CARE_PROVIDER_SITE_OTHER): Payer: PRIVATE HEALTH INSURANCE

## 2012-10-26 ENCOUNTER — Ambulatory Visit (INDEPENDENT_AMBULATORY_CARE_PROVIDER_SITE_OTHER): Payer: PRIVATE HEALTH INSURANCE | Admitting: Internal Medicine

## 2012-10-26 VITALS — BP 130/86 | HR 80 | Temp 98.3°F | Resp 16 | Ht 63.0 in | Wt 215.0 lb

## 2012-10-26 DIAGNOSIS — K279 Peptic ulcer, site unspecified, unspecified as acute or chronic, without hemorrhage or perforation: Secondary | ICD-10-CM

## 2012-10-26 DIAGNOSIS — Z9884 Bariatric surgery status: Secondary | ICD-10-CM

## 2012-10-26 DIAGNOSIS — E519 Thiamine deficiency, unspecified: Secondary | ICD-10-CM

## 2012-10-26 DIAGNOSIS — F319 Bipolar disorder, unspecified: Secondary | ICD-10-CM

## 2012-10-26 DIAGNOSIS — I10 Essential (primary) hypertension: Secondary | ICD-10-CM

## 2012-10-26 DIAGNOSIS — D509 Iron deficiency anemia, unspecified: Secondary | ICD-10-CM

## 2012-10-26 DIAGNOSIS — E785 Hyperlipidemia, unspecified: Secondary | ICD-10-CM

## 2012-10-26 DIAGNOSIS — F1910 Other psychoactive substance abuse, uncomplicated: Secondary | ICD-10-CM

## 2012-10-26 DIAGNOSIS — E876 Hypokalemia: Secondary | ICD-10-CM

## 2012-10-26 DIAGNOSIS — R7309 Other abnormal glucose: Secondary | ICD-10-CM | POA: Insufficient documentation

## 2012-10-26 DIAGNOSIS — F419 Anxiety disorder, unspecified: Secondary | ICD-10-CM

## 2012-10-26 DIAGNOSIS — D510 Vitamin B12 deficiency anemia due to intrinsic factor deficiency: Secondary | ICD-10-CM

## 2012-10-26 DIAGNOSIS — F3160 Bipolar disorder, current episode mixed, unspecified: Secondary | ICD-10-CM

## 2012-10-26 DIAGNOSIS — F411 Generalized anxiety disorder: Secondary | ICD-10-CM

## 2012-10-26 DIAGNOSIS — Z23 Encounter for immunization: Secondary | ICD-10-CM

## 2012-10-26 DIAGNOSIS — E559 Vitamin D deficiency, unspecified: Secondary | ICD-10-CM

## 2012-10-26 HISTORY — DX: Other abnormal glucose: R73.09

## 2012-10-26 LAB — CBC WITH DIFFERENTIAL/PLATELET
Basophils Absolute: 0 10*3/uL (ref 0.0–0.1)
Basophils Relative: 0.7 % (ref 0.0–3.0)
Eosinophils Absolute: 0.1 10*3/uL (ref 0.0–0.7)
Eosinophils Relative: 2.2 % (ref 0.0–5.0)
HCT: 38.9 % (ref 36.0–46.0)
Hemoglobin: 13.6 g/dL (ref 12.0–15.0)
Lymphocytes Relative: 28.6 % (ref 12.0–46.0)
Lymphs Abs: 1.8 10*3/uL (ref 0.7–4.0)
MCHC: 35 g/dL (ref 30.0–36.0)
MCV: 86.6 fl (ref 78.0–100.0)
Monocytes Absolute: 0.3 10*3/uL (ref 0.1–1.0)
Monocytes Relative: 4.2 % (ref 3.0–12.0)
Neutro Abs: 4 10*3/uL (ref 1.4–7.7)
Neutrophils Relative %: 64.3 % (ref 43.0–77.0)
Platelets: 277 10*3/uL (ref 150.0–400.0)
RBC: 4.49 Mil/uL (ref 3.87–5.11)
RDW: 14.2 % (ref 11.5–14.6)
WBC: 6.3 10*3/uL (ref 4.5–10.5)

## 2012-10-26 LAB — VITAMIN B12: Vitamin B-12: 324 pg/mL (ref 211–911)

## 2012-10-26 LAB — BASIC METABOLIC PANEL
BUN: 6 mg/dL (ref 6–23)
CO2: 25 mEq/L (ref 19–32)
Calcium: 8.9 mg/dL (ref 8.4–10.5)
Chloride: 104 mEq/L (ref 96–112)
Creatinine, Ser: 0.7 mg/dL (ref 0.4–1.2)
GFR: 116.61 mL/min (ref >60.00)
Glucose, Bld: 90 mg/dL (ref 70–99)
Potassium: 3.7 mEq/L (ref 3.5–5.1)
Sodium: 138 mEq/L (ref 135–145)

## 2012-10-26 LAB — LIPID PANEL
Cholesterol: 246 mg/dL — ABNORMAL HIGH (ref 0–200)
HDL: 48.6 mg/dL (ref >39.00)
Total CHOL/HDL Ratio: 5
Triglycerides: 53 mg/dL (ref 0.0–149.0)
VLDL: 10.6 mg/dL (ref 0.0–40.0)

## 2012-10-26 LAB — TSH: TSH: 1.91 u[IU]/mL (ref 0.35–5.50)

## 2012-10-26 LAB — IBC PANEL
Iron: 50 ug/dL (ref 42–145)
Saturation Ratios: 14.7 % — ABNORMAL LOW (ref 20.0–50.0)
Transferrin: 243.5 mg/dL (ref 212.0–360.0)

## 2012-10-26 LAB — FERRITIN: Ferritin: 90.5 ng/mL (ref 10.0–291.0)

## 2012-10-26 LAB — FOLATE: Folate: 15.5 ng/mL (ref >5.9)

## 2012-10-26 LAB — LDL CHOLESTEROL, DIRECT: Direct LDL: 195.6 mg/dL

## 2012-10-26 LAB — HEMOGLOBIN A1C: Hgb A1c MFr Bld: 5.2 % (ref 4.6–6.5)

## 2012-10-26 MED ORDER — GABAPENTIN 600 MG PO TABS: 600.0000 mg | ORAL_TABLET | Freq: Three times a day (TID) | ORAL | Status: DC

## 2012-10-26 MED ORDER — CLONAZEPAM 1 MG PO TABS: 1.0000 mg | ORAL_TABLET | Freq: Three times a day (TID) | ORAL | Status: DC

## 2012-10-26 MED ORDER — ESOMEPRAZOLE MAGNESIUM 40 MG PO CPDR: 40.0000 mg | DELAYED_RELEASE_CAPSULE | Freq: Every day | ORAL | Status: DC

## 2012-10-26 NOTE — Patient Instructions (Addendum)
Gastroesophageal Reflux Disease, Adult  Gastroesophageal reflux disease (GERD) happens when acid from your stomach flows up into the esophagus. When acid comes in contact with the esophagus, the acid causes soreness (inflammation) in the esophagus. Over time, GERD may create small holes (ulcers) in the lining of the esophagus.  CAUSES   · Increased body weight. This puts pressure on the stomach, making acid rise from the stomach into the esophagus.  · Smoking. This increases acid production in the stomach.  · Drinking alcohol. This causes decreased pressure in the lower esophageal sphincter (valve or ring of muscle between the esophagus and stomach), allowing acid from the stomach into the esophagus.  · Late evening meals and a full stomach. This increases pressure and acid production in the stomach.  · A malformed lower esophageal sphincter.  Sometimes, no cause is found.  SYMPTOMS   · Burning pain in the lower part of the mid-chest behind the breastbone and in the mid-stomach area. This may occur twice a week or more often.  · Trouble swallowing.  · Sore throat.  · Dry cough.  · Asthma-like symptoms including chest tightness, shortness of breath, or wheezing.  DIAGNOSIS   Your caregiver may be able to diagnose GERD based on your symptoms. In some cases, X-rays and other tests may be done to check for complications or to check the condition of your stomach and esophagus.  TREATMENT   Your caregiver may recommend over-the-counter or prescription medicines to help decrease acid production. Ask your caregiver before starting or adding any new medicines.   HOME CARE INSTRUCTIONS   · Change the factors that you can control. Ask your caregiver for guidance concerning weight loss, quitting smoking, and alcohol consumption.  · Avoid foods and drinks that make your symptoms worse, such as:  · Caffeine or alcoholic drinks.  · Chocolate.  · Peppermint or mint flavorings.  · Garlic and onions.  · Spicy foods.  · Citrus fruits,  such as oranges, lemons, or limes.  · Tomato-based foods such as sauce, chili, salsa, and pizza.  · Fried and fatty foods.  · Avoid lying down for the 3 hours prior to your bedtime or prior to taking a nap.  · Eat small, frequent meals instead of large meals.  · Wear loose-fitting clothing. Do not wear anything tight around your waist that causes pressure on your stomach.  · Raise the head of your bed 6 to 8 inches with wood blocks to help you sleep. Extra pillows will not help.  · Only take over-the-counter or prescription medicines for pain, discomfort, or fever as directed by your caregiver.  · Do not take aspirin, ibuprofen, or other nonsteroidal anti-inflammatory drugs (NSAIDs).  SEEK IMMEDIATE MEDICAL CARE IF:   · You have pain in your arms, neck, jaw, teeth, or back.  · Your pain increases or changes in intensity or duration.  · You develop nausea, vomiting, or sweating (diaphoresis).  · You develop shortness of breath, or you faint.  · Your vomit is green, yellow, black, or looks like coffee grounds or blood.  · Your stool is red, bloody, or black.  These symptoms could be signs of other problems, such as heart disease, gastric bleeding, or esophageal bleeding.  MAKE SURE YOU:   · Understand these instructions.  · Will watch your condition.  · Will get help right away if you are not doing well or get worse.  Document Released: 11/19/2004 Document Revised: 05/04/2011 Document Reviewed: 08/29/2010  [employer]® Patient   Information ©2014 [employer].

## 2012-10-26 NOTE — Assessment & Plan Note (Signed)
I will check her labs to see if there is any concern for malabsorption

## 2012-10-26 NOTE — Assessment & Plan Note (Signed)
I will recheck her K+ level and will treat if needed 

## 2012-10-26 NOTE — Assessment & Plan Note (Signed)
Will check her UDS to see if she has been compliant with the BZD therapy and to see if she has had a relapse

## 2012-10-26 NOTE — Assessment & Plan Note (Signed)
She will work on her lifestyle modifications to lose weight 

## 2012-10-26 NOTE — Assessment & Plan Note (Signed)
Will recheck her CBC today 

## 2012-10-26 NOTE — Assessment & Plan Note (Signed)
Start nexium 

## 2012-10-26 NOTE — Assessment & Plan Note (Signed)
CBC today.  

## 2012-10-26 NOTE — Progress Notes (Signed)
Subjective:    Patient ID: Shannon [name], female    DOB: 02-11-1966, 47 y.o.   MRN: [id number]  Anemia Presents for follow-up visit. There has been no abdominal pain, anorexia, bruising/bleeding easily, confusion, fever, leg swelling, light-headedness, malaise/fatigue, pallor, palpitations, paresthesias, pica or weight loss. Signs of blood loss that are not present include hematemesis, hematochezia, melena, menorrhagia and vaginal bleeding. Past treatments include nothing.      Review of Systems  Constitutional: Negative.  Negative for fever, chills, weight loss, malaise/fatigue, diaphoresis, activity change, appetite change, fatigue and unexpected weight change.  HENT: Negative.   Eyes: Negative.   Respiratory: Negative.  Negative for apnea, cough, choking, chest tightness, shortness of breath, wheezing and stridor.   Cardiovascular: Negative.  Negative for chest pain, palpitations and leg swelling.  Gastrointestinal: Negative.  Negative for nausea, vomiting, abdominal pain, diarrhea, constipation, blood in stool, melena, hematochezia, abdominal distention, anal bleeding, rectal pain, anorexia and hematemesis.       She has mild heartburn  Endocrine: Negative.  Negative for polydipsia, polyphagia and polyuria.  Genitourinary: Negative.  Negative for frequency, flank pain, decreased urine volume, vaginal bleeding and menorrhagia.  Musculoskeletal: Negative.   Skin: Negative.  Negative for color change and pallor.  Allergic/Immunologic: Negative.   Neurological: Negative.  Negative for light-headedness and paresthesias.  Hematological: Negative.  Negative for adenopathy. Does not bruise/bleed easily.  Psychiatric/Behavioral: Positive for dysphoric mood. Negative for suicidal ideas, hallucinations, behavioral problems, confusion, sleep disturbance, self-injury, decreased concentration and agitation. The patient is nervous/anxious. The patient is not hyperactive.        Objective:   Physical Exam  Vitals reviewed. Constitutional: She is oriented to person, place, and time. She appears well-developed and well-nourished. No distress.  HENT:  Head: Normocephalic and atraumatic.  Mouth/Throat: Oropharynx is clear and moist. No oropharyngeal exudate.  Eyes: Conjunctivae are normal. Right eye exhibits no discharge. Left eye exhibits no discharge. No scleral icterus.  Neck: Normal range of motion. Neck supple. No JVD present. No tracheal deviation present. No thyromegaly present.  Cardiovascular: Normal rate, regular rhythm and intact distal pulses.  Exam reveals no gallop and no friction rub.   No murmur heard. Pulmonary/Chest: Effort normal and breath sounds normal. No stridor. No respiratory distress. She has no wheezes. She has no rales. She exhibits no tenderness.  Abdominal: Soft. Bowel sounds are normal. She exhibits no distension and no mass. There is no tenderness. There is no rebound and no guarding.  Musculoskeletal: Normal range of motion. She exhibits no edema and no tenderness.  Lymphadenopathy:    She has no cervical adenopathy.  Neurological: She is oriented to person, place, and time.  Skin: Skin is warm and dry. No rash noted. She is not diaphoretic. No erythema. No pallor.  Psychiatric: She has a normal mood and affect. Her behavior is normal. Judgment and thought content normal.     Lab Results  Component Value Date   WBC 7.1 07/11/2012   HGB 12.3 07/11/2012   HCT 35.9* 07/11/2012   PLT 219 07/11/2012   GLUCOSE 131* 07/15/2012   CHOL 224* 02/26/2011   TRIG 260.0* 02/26/2011   HDL 57.20 02/26/2011   LDLDIRECT 132.8 02/26/2011   ALT 9 07/11/2012   AST 10 07/11/2012   NA 137 07/15/2012   K 3.6 07/15/2012   CL 105 07/15/2012   CREATININE 0.59 07/15/2012   BUN 7 07/15/2012   CO2 22 07/15/2012   TSH 4.04 02/26/2011   HGBA1C 5.7 11/06/2009  Assessment & Plan:

## 2012-10-26 NOTE — Assessment & Plan Note (Signed)
Her BP is well controlled 

## 2012-10-26 NOTE — Assessment & Plan Note (Signed)
I will check her A1C to see if she has developed DM2 

## 2012-10-26 NOTE — Assessment & Plan Note (Signed)
FLP today 

## 2012-10-26 NOTE — Assessment & Plan Note (Signed)
She will continue klonopin as needed

## 2012-10-27 ENCOUNTER — Encounter: Payer: Self-pay | Admitting: Internal Medicine

## 2012-10-27 DIAGNOSIS — E559 Vitamin D deficiency, unspecified: Secondary | ICD-10-CM

## 2012-10-27 HISTORY — DX: Vitamin D deficiency, unspecified: E55.9

## 2012-10-27 LAB — DRUGS OF ABUSE SCREEN W/O ALC, ROUTINE URINE
Amphetamine Screen, Ur: NEGATIVE
Barbiturate Quant, Ur: NEGATIVE
Benzodiazepines.: NEGATIVE
Cocaine Metabolites: NEGATIVE
Creatinine,U: 124.6 mg/dL
Marijuana Metabolite: NEGATIVE
Methadone: NEGATIVE
Opiate Screen, Urine: NEGATIVE
Phencyclidine (PCP): NEGATIVE
Propoxyphene: NEGATIVE

## 2012-10-27 LAB — VITAMIN D 25 HYDROXY (VIT D DEFICIENCY, FRACTURES): Vit D, 25-Hydroxy: 14 ng/mL — ABNORMAL LOW (ref 30–89)

## 2012-10-27 MED ORDER — CHOLECALCIFEROL 1.25 MG (50000 UT) PO TABS: 1.0000 | ORAL_TABLET | ORAL | Status: DC

## 2012-10-27 NOTE — Addendum Note (Signed)
Addended by: Etta Grandchild on: 10/27/2012 08:04 AM   Modules accepted: Orders

## 2012-10-27 NOTE — Assessment & Plan Note (Signed)
She will start cholecalciferol 50,000 units weekly

## 2012-10-29 LAB — VITAMIN B1: Vitamin B1 (Thiamine): 8 nmol/L (ref 8–30)

## 2012-10-30 ENCOUNTER — Encounter: Payer: Self-pay | Admitting: Internal Medicine

## 2012-10-30 DIAGNOSIS — E519 Thiamine deficiency, unspecified: Secondary | ICD-10-CM

## 2012-10-30 HISTORY — DX: Thiamine deficiency, unspecified: E51.9

## 2012-10-30 MED ORDER — VITAMIN B-1 100 MG PO TABS: 100.0000 mg | ORAL_TABLET | Freq: Every day | ORAL | Status: DC

## 2012-10-30 NOTE — Addendum Note (Signed)
Addended by: Etta Grandchild on: 10/30/2012 11:48 AM   Modules accepted: Orders

## 2012-11-15 ENCOUNTER — Ambulatory Visit (INDEPENDENT_AMBULATORY_CARE_PROVIDER_SITE_OTHER): Payer: 59 | Admitting: Psychiatry

## 2012-11-15 DIAGNOSIS — F319 Bipolar disorder, unspecified: Secondary | ICD-10-CM

## 2012-11-22 ENCOUNTER — Ambulatory Visit (INDEPENDENT_AMBULATORY_CARE_PROVIDER_SITE_OTHER): Payer: 59 | Admitting: Psychiatry

## 2012-11-22 DIAGNOSIS — F259 Schizoaffective disorder, unspecified: Secondary | ICD-10-CM

## 2012-11-22 DIAGNOSIS — F17200 Nicotine dependence, unspecified, uncomplicated: Secondary | ICD-10-CM

## 2012-11-30 ENCOUNTER — Ambulatory Visit (INDEPENDENT_AMBULATORY_CARE_PROVIDER_SITE_OTHER): Payer: 59 | Admitting: Psychiatry

## 2012-11-30 DIAGNOSIS — F17200 Nicotine dependence, unspecified, uncomplicated: Secondary | ICD-10-CM

## 2012-11-30 DIAGNOSIS — F319 Bipolar disorder, unspecified: Secondary | ICD-10-CM

## 2012-12-07 ENCOUNTER — Ambulatory Visit (INDEPENDENT_AMBULATORY_CARE_PROVIDER_SITE_OTHER): Payer: 59 | Admitting: Psychiatry

## 2012-12-07 DIAGNOSIS — F319 Bipolar disorder, unspecified: Secondary | ICD-10-CM

## 2012-12-07 DIAGNOSIS — F17200 Nicotine dependence, unspecified, uncomplicated: Secondary | ICD-10-CM

## 2012-12-08 ENCOUNTER — Encounter (INDEPENDENT_AMBULATORY_CARE_PROVIDER_SITE_OTHER): Payer: Self-pay

## 2012-12-08 ENCOUNTER — Ambulatory Visit (INDEPENDENT_AMBULATORY_CARE_PROVIDER_SITE_OTHER): Payer: PRIVATE HEALTH INSURANCE | Admitting: Physician Assistant

## 2012-12-08 VITALS — BP 140/90 | HR 96 | Temp 98.3°F | Resp 16 | Ht 63.0 in | Wt 238.8 lb

## 2012-12-08 DIAGNOSIS — Z4651 Encounter for fitting and adjustment of gastric lap band: Secondary | ICD-10-CM

## 2012-12-08 NOTE — Progress Notes (Signed)
  HISTORY: Shannon [name] is a 47 y.o.female who received an AP-Large lap-band in May 2012 by Dr. Ezzard Standing. She comes in with 20 lbs weight gain since her last visit in July. She had fluid replaced after a spell of reflux for which 2 mL was removed. She describes being able to eat 4 pieces of fish at one sitting followed by bread. She believes she needs a fill today. She denies regurgitation, but has occasional GERD symptoms which are aided by Nexium PRN. She describes increased sweets cravings as well.  VITAL SIGNS: Filed Vitals:   12/08/12 1005  BP: 140/90  Pulse: 96  Temp: 98.3 F (36.8 C)  Resp: 16    PHYSICAL EXAM: Physical exam reveals a very well-appearing 47 y.o.female in no apparent distress Neurologic: Awake, alert, oriented Psych: Bright affect, conversant Respiratory: Breathing even and unlabored. No stridor or wheezing Abdomen: Soft, nontender, nondistended to palpation. Incisions well-healed. No incisional hernias. Port easily palpated. Extremities: Atraumatic, good range of motion.  ASSESMENT: 47 y.o.  female  s/p AP-Large lap-band.   PLAN: The patient's port was accessed with a 20G Huber needle without difficulty. 8.5 mL clear fluid was aspirated and 0.5 mL saline was added to the port to give a total predicted volume of 9 mL. The patient was able to swallow water without difficulty following the procedure and was instructed to take clear liquids for the next 24-48 hours and advance slowly as tolerated. I have recommended a daily multivitamin as well as a consultation with Huntley Dec in nutrition. We'll have her back in three months.

## 2012-12-08 NOTE — Patient Instructions (Signed)
Take clear liquids for the remainder of the day. Thin protein shakes are ok to start this evening for your evening meal. Have one also for breakfast tomorrow morning. Try yogurt, cottage cheese or foods of similar consistency for lunch tomorrow. Slowly and carefully advance your diet thereafter. Call us if you have persistent vomiting or regurgitation, night cough or reflux symptoms. Return as scheduled or sooner if you notice no changes in hunger/portion sizes. Consider taking a chewable multivitamin daily. Consult your pharmacist about your over-the-counter choices. Follow-up also with Nutrition services for consultation.

## 2012-12-12 ENCOUNTER — Encounter: Payer: Self-pay | Admitting: *Deleted

## 2012-12-12 ENCOUNTER — Ambulatory Visit: Payer: Self-pay | Admitting: *Deleted

## 2012-12-14 ENCOUNTER — Ambulatory Visit (INDEPENDENT_AMBULATORY_CARE_PROVIDER_SITE_OTHER): Payer: 59 | Admitting: Psychiatry

## 2012-12-14 DIAGNOSIS — F319 Bipolar disorder, unspecified: Secondary | ICD-10-CM

## 2012-12-14 DIAGNOSIS — F17200 Nicotine dependence, unspecified, uncomplicated: Secondary | ICD-10-CM

## 2012-12-21 ENCOUNTER — Ambulatory Visit: Payer: 59 | Admitting: Psychiatry

## 2012-12-22 ENCOUNTER — Encounter (INDEPENDENT_AMBULATORY_CARE_PROVIDER_SITE_OTHER): Payer: PRIVATE HEALTH INSURANCE

## 2012-12-28 ENCOUNTER — Ambulatory Visit (INDEPENDENT_AMBULATORY_CARE_PROVIDER_SITE_OTHER): Payer: 59 | Admitting: Psychiatry

## 2012-12-28 DIAGNOSIS — F17200 Nicotine dependence, unspecified, uncomplicated: Secondary | ICD-10-CM

## 2012-12-28 DIAGNOSIS — F319 Bipolar disorder, unspecified: Secondary | ICD-10-CM

## 2013-01-04 ENCOUNTER — Ambulatory Visit: Payer: 59 | Admitting: Psychiatry

## 2013-01-11 ENCOUNTER — Ambulatory Visit (INDEPENDENT_AMBULATORY_CARE_PROVIDER_SITE_OTHER): Payer: 59 | Admitting: Psychiatry

## 2013-01-11 DIAGNOSIS — F319 Bipolar disorder, unspecified: Secondary | ICD-10-CM

## 2013-01-11 DIAGNOSIS — F17200 Nicotine dependence, unspecified, uncomplicated: Secondary | ICD-10-CM

## 2013-01-18 ENCOUNTER — Ambulatory Visit (INDEPENDENT_AMBULATORY_CARE_PROVIDER_SITE_OTHER): Payer: 59 | Admitting: Psychiatry

## 2013-01-18 DIAGNOSIS — F319 Bipolar disorder, unspecified: Secondary | ICD-10-CM

## 2013-01-18 DIAGNOSIS — F17200 Nicotine dependence, unspecified, uncomplicated: Secondary | ICD-10-CM

## 2013-01-25 ENCOUNTER — Ambulatory Visit (INDEPENDENT_AMBULATORY_CARE_PROVIDER_SITE_OTHER): Payer: 59 | Admitting: Psychiatry

## 2013-01-25 DIAGNOSIS — F17200 Nicotine dependence, unspecified, uncomplicated: Secondary | ICD-10-CM

## 2013-01-25 DIAGNOSIS — F319 Bipolar disorder, unspecified: Secondary | ICD-10-CM

## 2013-02-21 ENCOUNTER — Ambulatory Visit (INDEPENDENT_AMBULATORY_CARE_PROVIDER_SITE_OTHER): Payer: 59 | Admitting: Psychiatry

## 2013-02-21 DIAGNOSIS — F17200 Nicotine dependence, unspecified, uncomplicated: Secondary | ICD-10-CM

## 2013-02-21 DIAGNOSIS — F319 Bipolar disorder, unspecified: Secondary | ICD-10-CM

## 2013-02-28 ENCOUNTER — Ambulatory Visit: Payer: 59 | Admitting: Psychiatry

## 2013-03-07 ENCOUNTER — Ambulatory Visit (INDEPENDENT_AMBULATORY_CARE_PROVIDER_SITE_OTHER): Payer: 59 | Admitting: Psychiatry

## 2013-03-07 DIAGNOSIS — F319 Bipolar disorder, unspecified: Secondary | ICD-10-CM

## 2013-03-07 DIAGNOSIS — F17200 Nicotine dependence, unspecified, uncomplicated: Secondary | ICD-10-CM

## 2013-03-08 ENCOUNTER — Encounter: Payer: Self-pay | Admitting: Internal Medicine

## 2013-03-08 ENCOUNTER — Ambulatory Visit (INDEPENDENT_AMBULATORY_CARE_PROVIDER_SITE_OTHER): Payer: PRIVATE HEALTH INSURANCE | Admitting: Internal Medicine

## 2013-03-08 VITALS — BP 120/84 | HR 80 | Temp 98.0°F | Resp 16 | Ht 63.0 in | Wt 242.0 lb

## 2013-03-08 DIAGNOSIS — D509 Iron deficiency anemia, unspecified: Secondary | ICD-10-CM

## 2013-03-08 DIAGNOSIS — F419 Anxiety disorder, unspecified: Secondary | ICD-10-CM

## 2013-03-08 DIAGNOSIS — F319 Bipolar disorder, unspecified: Secondary | ICD-10-CM

## 2013-03-08 DIAGNOSIS — I10 Essential (primary) hypertension: Secondary | ICD-10-CM

## 2013-03-08 DIAGNOSIS — E519 Thiamine deficiency, unspecified: Secondary | ICD-10-CM

## 2013-03-08 DIAGNOSIS — E876 Hypokalemia: Secondary | ICD-10-CM

## 2013-03-08 DIAGNOSIS — K279 Peptic ulcer, site unspecified, unspecified as acute or chronic, without hemorrhage or perforation: Secondary | ICD-10-CM

## 2013-03-08 DIAGNOSIS — Z9884 Bariatric surgery status: Secondary | ICD-10-CM

## 2013-03-08 DIAGNOSIS — F411 Generalized anxiety disorder: Secondary | ICD-10-CM

## 2013-03-08 MED ORDER — ESOMEPRAZOLE MAGNESIUM 40 MG PO CPDR: 40.0000 mg | DELAYED_RELEASE_CAPSULE | Freq: Every day | ORAL | Status: DC

## 2013-03-08 MED ORDER — CLONAZEPAM 1 MG PO TABS: 1.0000 mg | ORAL_TABLET | Freq: Three times a day (TID) | ORAL | Status: DC

## 2013-03-08 NOTE — Patient Instructions (Signed)
Gastroesophageal Reflux Disease, Adult  Gastroesophageal reflux disease (GERD) happens when acid from your stomach flows up into the esophagus. When acid comes in contact with the esophagus, the acid causes soreness (inflammation) in the esophagus. Over time, GERD may create small holes (ulcers) in the lining of the esophagus.  CAUSES   · Increased body weight. This puts pressure on the stomach, making acid rise from the stomach into the esophagus.  · Smoking. This increases acid production in the stomach.  · Drinking alcohol. This causes decreased pressure in the lower esophageal sphincter (valve or ring of muscle between the esophagus and stomach), allowing acid from the stomach into the esophagus.  · Late evening meals and a full stomach. This increases pressure and acid production in the stomach.  · A malformed lower esophageal sphincter.  Sometimes, no cause is found.  SYMPTOMS   · Burning pain in the lower part of the mid-chest behind the breastbone and in the mid-stomach area. This may occur twice a week or more often.  · Trouble swallowing.  · Sore throat.  · Dry cough.  · Asthma-like symptoms including chest tightness, shortness of breath, or wheezing.  DIAGNOSIS   Your caregiver may be able to diagnose GERD based on your symptoms. In some cases, X-rays and other tests may be done to check for complications or to check the condition of your stomach and esophagus.  TREATMENT   Your caregiver may recommend over-the-counter or prescription medicines to help decrease acid production. Ask your caregiver before starting or adding any new medicines.   HOME CARE INSTRUCTIONS   · Change the factors that you can control. Ask your caregiver for guidance concerning weight loss, quitting smoking, and alcohol consumption.  · Avoid foods and drinks that make your symptoms worse, such as:  · Caffeine or alcoholic drinks.  · Chocolate.  · Peppermint or mint flavorings.  · Garlic and onions.  · Spicy foods.  · Citrus fruits,  such as oranges, lemons, or limes.  · Tomato-based foods such as sauce, chili, salsa, and pizza.  · Fried and fatty foods.  · Avoid lying down for the 3 hours prior to your bedtime or prior to taking a nap.  · Eat small, frequent meals instead of large meals.  · Wear loose-fitting clothing. Do not wear anything tight around your waist that causes pressure on your stomach.  · Raise the head of your bed 6 to 8 inches with wood blocks to help you sleep. Extra pillows will not help.  · Only take over-the-counter or prescription medicines for pain, discomfort, or fever as directed by your caregiver.  · Do not take aspirin, ibuprofen, or other nonsteroidal anti-inflammatory drugs (NSAIDs).  SEEK IMMEDIATE MEDICAL CARE IF:   · You have pain in your arms, neck, jaw, teeth, or back.  · Your pain increases or changes in intensity or duration.  · You develop nausea, vomiting, or sweating (diaphoresis).  · You develop shortness of breath, or you faint.  · Your vomit is green, yellow, black, or looks like coffee grounds or blood.  · Your stool is red, bloody, or black.  These symptoms could be signs of other problems, such as heart disease, gastric bleeding, or esophageal bleeding.  MAKE SURE YOU:   · Understand these instructions.  · Will watch your condition.  · Will get help right away if you are not doing well or get worse.  Document Released: 11/19/2004 Document Revised: 05/04/2011 Document Reviewed: 08/29/2010  [employer]® Patient   Information ©2014 [employer].

## 2013-03-08 NOTE — Assessment & Plan Note (Signed)
I will recheck her CBC today and will address if needed

## 2013-03-08 NOTE — Assessment & Plan Note (Signed)
Klonopin was refilled today

## 2013-03-08 NOTE — Progress Notes (Signed)
Pre-visit discussion using our clinic review tool. No additional management support is needed unless otherwise documented below in the visit note.  

## 2013-03-08 NOTE — Assessment & Plan Note (Signed)
I will recheck her K+ level today 

## 2013-03-08 NOTE — Assessment & Plan Note (Signed)
Referral sent for f/up on this

## 2013-03-08 NOTE — Assessment & Plan Note (Signed)
She is doing well on nexium

## 2013-03-08 NOTE — Progress Notes (Signed)
Subjective:    Patient ID: Shannon [name], female    DOB: 1965-10-29, 48 y.o.   MRN: [id number]  Gastrophageal Reflux She complains of heartburn. She reports no abdominal pain, no belching, no chest pain, no choking, no coughing, no dysphagia, no early satiety, no globus sensation, no hoarse voice, no nausea, no sore throat, no stridor, no tooth decay, no water brash or no wheezing. This is a chronic problem. The current episode started more than 1 year ago. The problem occurs frequently. The problem has been unchanged. Nothing aggravates the symptoms. Pertinent negatives include no anemia, fatigue, melena, muscle weakness, orthopnea or weight loss. She has tried a PPI for the symptoms. The treatment provided moderate relief.      Review of Systems  Constitutional: Negative.  Negative for fever, chills, weight loss, diaphoresis, appetite change and fatigue.  HENT: Negative.  Negative for hoarse voice and sore throat.   Eyes: Negative.   Respiratory: Negative.  Negative for cough, choking and wheezing.   Cardiovascular: Negative for chest pain, palpitations and leg swelling.  Gastrointestinal: Positive for heartburn. Negative for dysphagia, nausea, vomiting, abdominal pain, diarrhea, constipation, blood in stool and melena.  Endocrine: Negative.   Genitourinary: Negative.   Musculoskeletal: Negative.  Negative for muscle weakness.  Skin: Negative.   Allergic/Immunologic: Negative.   Neurological: Negative.   Hematological: Negative.  Negative for adenopathy. Does not bruise/bleed easily.  Psychiatric/Behavioral: Negative for suicidal ideas, hallucinations, behavioral problems, confusion, sleep disturbance, self-injury, dysphoric mood, decreased concentration and agitation. The patient is nervous/anxious. The patient is not hyperactive.        Objective:   Physical Exam  Vitals reviewed. Constitutional: She is oriented to person, place, and time. She appears well-developed and  well-nourished. No distress.  HENT:  Head: Normocephalic and atraumatic.  Mouth/Throat: Oropharynx is clear and moist. No oropharyngeal exudate.  Eyes: Conjunctivae are normal. Right eye exhibits no discharge. Left eye exhibits no discharge. No scleral icterus.  Neck: Normal range of motion. Neck supple. No JVD present. No tracheal deviation present. No thyromegaly present.  Cardiovascular: Normal rate, regular rhythm, normal heart sounds and intact distal pulses.  Exam reveals no gallop and no friction rub.   No murmur heard. Pulmonary/Chest: Effort normal and breath sounds normal. No stridor. No respiratory distress. She has no wheezes. She has no rales. She exhibits no tenderness.  Abdominal: Soft. Bowel sounds are normal. She exhibits no distension and no mass. There is no tenderness. There is no rebound and no guarding.  Musculoskeletal: Normal range of motion. She exhibits no edema and no tenderness.  Lymphadenopathy:    She has no cervical adenopathy.  Neurological: She is oriented to person, place, and time.  Skin: Skin is warm and dry. No rash noted. She is not diaphoretic. No erythema. No pallor.  Psychiatric: She has a normal mood and affect. Her behavior is normal. Judgment and thought content normal.     Lab Results  Component Value Date   WBC 6.3 10/26/2012   HGB 13.6 10/26/2012   HCT 38.9 10/26/2012   PLT 277.0 10/26/2012   GLUCOSE 90 10/26/2012   CHOL 246* 10/26/2012   TRIG 53.0 10/26/2012   HDL 48.60 10/26/2012   LDLDIRECT 195.6 10/26/2012   ALT 9 07/11/2012   AST 10 07/11/2012   NA 138 10/26/2012   K 3.7 10/26/2012   CL 104 10/26/2012   CREATININE 0.7 10/26/2012   BUN 6 10/26/2012   CO2 25 10/26/2012   TSH 1.91 10/26/2012  HGBA1C 5.2 10/26/2012       Assessment & Plan:

## 2013-03-08 NOTE — Assessment & Plan Note (Signed)
She tells me that she is not taking the thiamine supplement so I will recheck her level today

## 2013-03-14 ENCOUNTER — Ambulatory Visit (INDEPENDENT_AMBULATORY_CARE_PROVIDER_SITE_OTHER): Payer: 59 | Admitting: Psychiatry

## 2013-03-14 DIAGNOSIS — F17200 Nicotine dependence, unspecified, uncomplicated: Secondary | ICD-10-CM

## 2013-03-14 DIAGNOSIS — F319 Bipolar disorder, unspecified: Secondary | ICD-10-CM

## 2013-03-16 ENCOUNTER — Encounter (INDEPENDENT_AMBULATORY_CARE_PROVIDER_SITE_OTHER): Payer: Self-pay

## 2013-03-16 ENCOUNTER — Ambulatory Visit (INDEPENDENT_AMBULATORY_CARE_PROVIDER_SITE_OTHER): Payer: PRIVATE HEALTH INSURANCE | Admitting: Physician Assistant

## 2013-03-16 VITALS — BP 110/80 | HR 100 | Resp 16 | Ht 63.0 in | Wt 242.8 lb

## 2013-03-16 DIAGNOSIS — Z4651 Encounter for fitting and adjustment of gastric lap band: Secondary | ICD-10-CM

## 2013-03-16 NOTE — Patient Instructions (Signed)

## 2013-03-16 NOTE — Progress Notes (Signed)
  HISTORY: JALEY YAN is a 48 y.o.female who received an AP-Large lap-band in March 2012 by Dr. Lucia Gaskins. She comes in with 4 lbs weight gain since her last visit. She denies regurgitation and says Nexium has helped with her reflux. She admits to laying in bed to read shortly after eating a meal. Otherwise she says she's able to eat chicken and fish without difficulty.  VITAL SIGNS: Filed Vitals:   03/16/13 1057  BP: 110/80  Pulse: 100  Resp: 16    PHYSICAL EXAM: Physical exam reveals a very well-appearing 48 y.o.female in no apparent distress Neurologic: Awake, alert, oriented Psych: Bright affect, conversant Respiratory: Breathing even and unlabored. No stridor or wheezing Abdomen: Soft, nontender, nondistended to palpation. Incisions well-healed. No incisional hernias. Port easily palpated. Extremities: Atraumatic, good range of motion.  ASSESMENT: 48 y.o.  female  s/p AP-Large lap-band.   PLAN: The patient's port was accessed with a 20G Huber needle without difficulty. Clear fluid was aspirated and 0.5 mL saline was added to the port to give a total predicted volume of 9.5 mL. The patient was able to swallow water without difficulty following the procedure and was instructed to take clear liquids for the next 24-48 hours and advance slowly as tolerated.

## 2013-03-21 ENCOUNTER — Ambulatory Visit: Payer: 59 | Admitting: Psychiatry

## 2013-03-28 ENCOUNTER — Ambulatory Visit (INDEPENDENT_AMBULATORY_CARE_PROVIDER_SITE_OTHER): Payer: 59 | Admitting: Psychiatry

## 2013-03-28 ENCOUNTER — Other Ambulatory Visit: Payer: Self-pay | Admitting: Internal Medicine

## 2013-03-28 DIAGNOSIS — F17200 Nicotine dependence, unspecified, uncomplicated: Secondary | ICD-10-CM

## 2013-03-28 DIAGNOSIS — F319 Bipolar disorder, unspecified: Secondary | ICD-10-CM

## 2013-04-04 ENCOUNTER — Ambulatory Visit (INDEPENDENT_AMBULATORY_CARE_PROVIDER_SITE_OTHER): Payer: 59 | Admitting: Psychiatry

## 2013-04-04 DIAGNOSIS — F319 Bipolar disorder, unspecified: Secondary | ICD-10-CM

## 2013-04-04 DIAGNOSIS — F17200 Nicotine dependence, unspecified, uncomplicated: Secondary | ICD-10-CM

## 2013-04-11 ENCOUNTER — Ambulatory Visit: Payer: 59 | Admitting: Psychiatry

## 2013-04-18 ENCOUNTER — Ambulatory Visit (INDEPENDENT_AMBULATORY_CARE_PROVIDER_SITE_OTHER): Payer: 59 | Admitting: Psychiatry

## 2013-04-18 DIAGNOSIS — F319 Bipolar disorder, unspecified: Secondary | ICD-10-CM

## 2013-04-18 DIAGNOSIS — F17200 Nicotine dependence, unspecified, uncomplicated: Secondary | ICD-10-CM

## 2013-04-25 ENCOUNTER — Ambulatory Visit: Payer: 59 | Admitting: Psychiatry

## 2013-05-02 ENCOUNTER — Ambulatory Visit (INDEPENDENT_AMBULATORY_CARE_PROVIDER_SITE_OTHER): Payer: 59 | Admitting: Psychiatry

## 2013-05-02 DIAGNOSIS — F4323 Adjustment disorder with mixed anxiety and depressed mood: Secondary | ICD-10-CM

## 2013-05-02 DIAGNOSIS — Z630 Problems in relationship with spouse or partner: Secondary | ICD-10-CM

## 2013-05-02 DIAGNOSIS — Z7189 Other specified counseling: Secondary | ICD-10-CM

## 2013-05-03 ENCOUNTER — Encounter: Payer: Self-pay | Admitting: Internal Medicine

## 2013-05-03 ENCOUNTER — Ambulatory Visit (INDEPENDENT_AMBULATORY_CARE_PROVIDER_SITE_OTHER): Payer: PRIVATE HEALTH INSURANCE | Admitting: Internal Medicine

## 2013-05-03 ENCOUNTER — Ambulatory Visit (INDEPENDENT_AMBULATORY_CARE_PROVIDER_SITE_OTHER)
Admission: RE | Admit: 2013-05-03 | Discharge: 2013-05-03 | Disposition: A | Discharge status: Home / Self Care | Payer: PRIVATE HEALTH INSURANCE | Source: Ambulatory Visit | Attending: Internal Medicine | Admitting: Internal Medicine

## 2013-05-03 VITALS — BP 136/88 | HR 80 | Temp 98.2°F | Resp 16 | Ht 63.0 in | Wt 242.0 lb

## 2013-05-03 DIAGNOSIS — M25511 Pain in right shoulder: Secondary | ICD-10-CM

## 2013-05-03 DIAGNOSIS — M25519 Pain in unspecified shoulder: Secondary | ICD-10-CM

## 2013-05-03 DIAGNOSIS — D510 Vitamin B12 deficiency anemia due to intrinsic factor deficiency: Secondary | ICD-10-CM

## 2013-05-03 DIAGNOSIS — H539 Unspecified visual disturbance: Secondary | ICD-10-CM

## 2013-05-03 DIAGNOSIS — E519 Thiamine deficiency, unspecified: Secondary | ICD-10-CM

## 2013-05-03 HISTORY — DX: Pain in right shoulder: M25.511

## 2013-05-03 HISTORY — DX: Unspecified visual disturbance: H53.9

## 2013-05-03 NOTE — Assessment & Plan Note (Signed)
Will refer to sports medicine for further evaluation 

## 2013-05-03 NOTE — Assessment & Plan Note (Signed)
She will do the previously ordered labs today

## 2013-05-03 NOTE — Progress Notes (Signed)
Pre visit review using our clinic review tool, if applicable. No additional management support is needed unless otherwise documented below in the visit note. 

## 2013-05-03 NOTE — Assessment & Plan Note (Signed)
I will recheck her CBC today and will advise further if needed

## 2013-05-03 NOTE — Progress Notes (Signed)
Subjective:    Patient ID: Shannon [name], female    DOB: 1966-01-24, 48 y.o.   MRN: [id number]  Shoulder Pain  The pain is present in the right shoulder. This is a chronic problem. Episode onset: for 14 years. There has been a history of trauma (in 2001). The problem has been gradually worsening. The quality of the pain is described as aching. The pain is at a severity of 2/10. The pain is mild. Associated symptoms include a limited range of motion. Pertinent negatives include no fever, inability to bear weight, itching, joint locking, joint swelling, numbness, stiffness or tingling. She has tried acetaminophen and NSAIDS for the symptoms. The treatment provided moderate relief.      Review of Systems  Constitutional: Negative.  Negative for fever, chills, diaphoresis, appetite change and fatigue.  HENT: Negative.   Eyes: Positive for visual disturbance. Negative for photophobia, pain, discharge, redness and itching.       She has had trouble with the vision in her left eye for 2-3 years and today she requests a referral to an eye doctor  Respiratory: Negative.  Negative for cough, choking, chest tightness, shortness of breath and stridor.   Cardiovascular: Negative.  Negative for chest pain, palpitations and leg swelling.  Gastrointestinal: Negative.  Negative for vomiting, abdominal pain, diarrhea, constipation and blood in stool.  Endocrine: Negative.   Genitourinary: Negative.   Musculoskeletal: Positive for arthralgias. Negative for back pain, myalgias, neck pain, neck stiffness and stiffness.  Skin: Negative.  Negative for itching.  Allergic/Immunologic: Negative.   Neurological: Negative for dizziness, tingling and numbness.  Hematological: Negative.  Negative for adenopathy. Does not bruise/bleed easily.  Psychiatric/Behavioral: Negative for suicidal ideas, hallucinations, behavioral problems, confusion, sleep disturbance, self-injury, dysphoric mood and decreased concentration.  The patient is nervous/anxious. The patient is not hyperactive.        Objective:   Physical Exam  Vitals reviewed. Constitutional: She is oriented to person, place, and time. She appears well-developed and well-nourished. No distress.  HENT:  Head: Normocephalic and atraumatic.  Mouth/Throat: Oropharynx is clear and moist. No oropharyngeal exudate.  Eyes: Conjunctivae are normal. Right eye exhibits no discharge. Left eye exhibits no discharge. No scleral icterus.  Neck: Normal range of motion. Neck supple. No JVD present. No tracheal deviation present. No thyromegaly present.  Cardiovascular: Normal rate, regular rhythm, normal heart sounds and intact distal pulses.  Exam reveals no gallop and no friction rub.   No murmur heard. Pulmonary/Chest: Effort normal and breath sounds normal. No stridor. No respiratory distress. She has no wheezes. She has no rales. She exhibits no tenderness.  Abdominal: Soft. Bowel sounds are normal. She exhibits no distension and no mass. There is no tenderness. There is no rebound and no guarding.  Musculoskeletal: She exhibits no edema and no tenderness.       Right shoulder: She exhibits decreased range of motion. She exhibits no tenderness, no bony tenderness, no swelling, no effusion, no crepitus, no deformity, no laceration, no pain, no spasm, normal pulse and normal strength.  Lymphadenopathy:    She has no cervical adenopathy.  Neurological: She is oriented to person, place, and time.  Skin: Skin is warm and dry. No rash noted. She is not diaphoretic. No erythema. No pallor.  Psychiatric: She has a normal mood and affect. Her behavior is normal. Judgment and thought content normal.     Lab Results  Component Value Date   WBC 6.3 10/26/2012   HGB 13.6 10/26/2012  HCT 38.9 10/26/2012   PLT 277.0 10/26/2012   GLUCOSE 90 10/26/2012   CHOL 246* 10/26/2012   TRIG 53.0 10/26/2012   HDL 48.60 10/26/2012   LDLDIRECT 195.6 10/26/2012   ALT 9 07/11/2012   AST 10  07/11/2012   NA 138 10/26/2012   K 3.7 10/26/2012   CL 104 10/26/2012   CREATININE 0.7 10/26/2012   BUN 6 10/26/2012   CO2 25 10/26/2012   TSH 1.91 10/26/2012   HGBA1C 5.2 10/26/2012       Assessment & Plan:

## 2013-05-03 NOTE — Assessment & Plan Note (Signed)
ophth referral

## 2013-05-03 NOTE — Patient Instructions (Signed)
   Shoulder Pain The shoulder is the joint that connects your arms to your body. The bones that form the shoulder joint include the upper arm bone (humerus), the shoulder blade (scapula), and the collarbone (clavicle). The top of the humerus is shaped like a ball and fits into a rather flat socket on the scapula (glenoid cavity). A combination of muscles and strong, fibrous tissues that connect muscles to bones (tendons) support your shoulder joint and hold the ball in the socket. Small, fluid-filled sacs (bursae) are located in different areas of the joint. They act as cushions between the bones and the overlying soft tissues and help reduce friction between the gliding tendons and the bone as you move your arm. Your shoulder joint allows a wide range of motion in your arm. This range of motion allows you to do things like scratch your back or throw a ball. However, this range of motion also makes your shoulder more prone to pain from overuse and injury. Causes of shoulder pain can originate from both injury and overuse and usually can be grouped in the following four categories:  Redness, swelling, and pain (inflammation) of the tendon (tendinitis) or the bursae (bursitis).  Instability, such as a dislocation of the joint.  Inflammation of the joint (arthritis).  Broken bone (fracture). HOME CARE INSTRUCTIONS   Apply ice to the sore area.  Put ice in a plastic bag.  Place a towel between your skin and the bag.  Leave the ice on for 15-20 minutes, 03-04 times per day for the first 2 days.  Stop using cold packs if they do not help with the pain.  If you have a shoulder sling or immobilizer, wear it as long as your caregiver instructs. Only remove it to shower or bathe. Move your arm as little as possible, but keep your hand moving to prevent swelling.  Squeeze a soft ball or foam pad as much as possible to help prevent swelling.  Only take over-the-counter or prescription medicines for  pain, discomfort, or fever as directed by your caregiver. SEEK MEDICAL CARE IF:   Your shoulder pain increases, or new pain develops in your arm, hand, or fingers.  Your hand or fingers become cold and numb.  Your pain is not relieved with medicines. SEEK IMMEDIATE MEDICAL CARE IF:   Your arm, hand, or fingers are numb or tingling.  Your arm, hand, or fingers are significantly swollen or turn white or blue. MAKE SURE YOU:   Understand these instructions.  Will watch your condition.  Will get help right away if you are not doing well or get worse. Document Released: 11/19/2004 Document Revised: 11/04/2011 Document Reviewed: 01/24/2011 [employer] Patient Information 2014 [employer].  

## 2013-05-04 ENCOUNTER — Encounter (INDEPENDENT_AMBULATORY_CARE_PROVIDER_SITE_OTHER): Payer: PRIVATE HEALTH INSURANCE

## 2013-05-09 ENCOUNTER — Ambulatory Visit (INDEPENDENT_AMBULATORY_CARE_PROVIDER_SITE_OTHER): Payer: 59 | Admitting: Psychiatry

## 2013-05-09 DIAGNOSIS — F319 Bipolar disorder, unspecified: Secondary | ICD-10-CM

## 2013-05-09 DIAGNOSIS — F17200 Nicotine dependence, unspecified, uncomplicated: Secondary | ICD-10-CM

## 2013-05-16 ENCOUNTER — Ambulatory Visit: Payer: Self-pay | Admitting: Family Medicine

## 2013-05-16 ENCOUNTER — Ambulatory Visit: Payer: 59 | Admitting: Psychiatry

## 2013-05-17 ENCOUNTER — Other Ambulatory Visit (INDEPENDENT_AMBULATORY_CARE_PROVIDER_SITE_OTHER): Payer: PRIVATE HEALTH INSURANCE

## 2013-05-17 ENCOUNTER — Ambulatory Visit (INDEPENDENT_AMBULATORY_CARE_PROVIDER_SITE_OTHER): Payer: PRIVATE HEALTH INSURANCE | Admitting: Family Medicine

## 2013-05-17 ENCOUNTER — Encounter: Payer: Self-pay | Admitting: Family Medicine

## 2013-05-17 VITALS — BP 146/90 | HR 115 | Wt 245.0 lb

## 2013-05-17 DIAGNOSIS — M25511 Pain in right shoulder: Secondary | ICD-10-CM

## 2013-05-17 DIAGNOSIS — M25519 Pain in unspecified shoulder: Secondary | ICD-10-CM

## 2013-05-17 DIAGNOSIS — M7550 Bursitis of unspecified shoulder: Secondary | ICD-10-CM

## 2013-05-17 DIAGNOSIS — M75100 Unspecified rotator cuff tear or rupture of unspecified shoulder, not specified as traumatic: Secondary | ICD-10-CM

## 2013-05-17 DIAGNOSIS — IMO0002 Reserved for concepts with insufficient information to code with codable children: Secondary | ICD-10-CM

## 2013-05-17 HISTORY — DX: Bursitis of unspecified shoulder: M75.50

## 2013-05-17 NOTE — Patient Instructions (Addendum)
Very nice to meet you Ice 20 minutes 2 times a day exercises most days of the week Come back in 3 weeks if not better

## 2013-05-17 NOTE — Progress Notes (Signed)
[name] [address], [place] [address] Phone: [phone] Subjective:    I'm seeing this patient by the request  of:  Scarlette Calico, MD    CC: Right shoulder pain    [id number] Shannon [name] is a 48 y.o. female coming in with complaint of right shoulder pain for approximately 6 months duration. Patient states that she had a fall approximately 14 years ago. Patient is Shannon [name] November any true injury and states that he got better with ice. Patient states since that she's had intermittent pain from time to time of the right shoulder. Patient states the last 6 months duration it has gotten to be really worse. Patient states it is more of a constant dull aching sensation that is worse with any type of movement. Worse in the morning when she is waking up. Patient denies any numbness or any weakness of the hand. Patient does like to be pain-free so she can start working again. Patient puts the severity of 9/10. Denies any nighttime awakening.     Past medical history, social, surgical and family history all reviewed in electronic medical record.   Review of Systems: No headache, visual changes, nausea, vomiting, diarrhea, constipation, dizziness, abdominal pain, skin rash, fevers, chills, night sweats, weight loss, swollen lymph nodes, body aches, joint swelling, muscle aches, chest pain, shortness of breath, mood changes.   Objective Blood pressure 146/90, pulse 115, weight 245 lb (111.131 kg), SpO2 97.00%.  General: No apparent distress alert and oriented x3 mood and affect normal, dressed appropriately.  HEENT: Pupils equal, extraocular movements intact  Respiratory: Patient's speak in full sentences and does not appear short of breath  Cardiovascular: No lower extremity edema, non tender, no erythema  Skin: Warm dry intact with no signs of infection or rash on extremities or on axial skeleton.  Abdomen: Soft nontender  Neuro: Cranial nerves  II through XII are intact, neurovascularly intact in all extremities with 2+ DTRs and 2+ pulses.  Lymph: No lymphadenopathy of posterior or anterior cervical chain or axillae bilaterally.  Gait normal with good balance and coordination.  MSK:  Non tender with full range of motion and good stability and symmetric strength and tone of  elbows, wrist, hip, knee and ankles bilaterally.  Shoulder: Right Inspection reveals no abnormalities, atrophy or asymmetry. Palpation is normal with no tenderness over AC joint or bicipital groove. ROM is full passively the patient does have some mild pain when you forward flex greater than 120 her abduction greater than 90. Rotator cuff strength normal throughout. signs of impingement with positive Neer and Hawkin's tests, empty can sign. Speeds and Yergason's tests normal. No labral pathology noted with negative Obrien's, negative clunk and good stability. Normal scapular function observed. No painful arc and no drop arm sign. No apprehension sign Contralateral side unremarkable  MSK US performed of: Right shoulder This study was ordered, performed, and interpreted by Charlann Boxer D.O.  Shoulder:   Supraspinatus:  Appears normal on long and transverse views,  bursal bulge seen with shoulder abduction on impingement view. Infraspinatus:  Appears normal on long and transverse views. Positive bursitis noted Subscapularis:  Appears normal on long and transverse views. Teres Minor:  Appears normal on long and transverse views. AC joint:  Capsule undistended, no geyser sign. Glenohumeral Joint:  Appears normal without effusion. Glenoid Labrum:  Intact without visualized tears. Biceps Tendon:  Appears normal on long and transverse views, no fraying of tendon, tendon located in intertubercular  groove, no subluxation with shoulder internal or external rotation. No increased power doppler signal.  Impression: Subacromial bursitis  Procedure: Real-time  Ultrasound Guided Injection of right glenohumeral joint Device: GE Logiq E  Ultrasound guided injection is preferred based studies that show increased duration, increased effect, greater accuracy, decreased procedural pain, increased response rate with ultrasound guided versus blind injection.  Verbal informed consent obtained.  Time-out conducted.  Noted no overlying erythema, induration, or other signs of local infection.  Skin prepped in a sterile fashion.  Local anesthesia: Topical Ethyl chloride.  With sterile technique and under real time ultrasound guidance:  Joint visualized.  23g 1  inch needle inserted posterior approach. Pictures taken for needle placement. Patient did have injection of 2 cc of 1% lidocaine, 2 cc of 0.5% Marcaine, and 1.0 cc of Kenalog 40 mg/dL. Completed without difficulty  Pain immediately resolved suggesting accurate placement of the medication.  Advised to call if fevers/chills, erythema, induration, drainage, or persistent bleeding.  Images permanently stored and available for review in the ultrasound unit.  Impression: Technically successful ultrasound guided injection.     Impression and Recommendations:     This case required medical decision making of moderate complexity.

## 2013-05-17 NOTE — Assessment & Plan Note (Signed)
Injection given today. We discussed icing protocol. Patient was to avoid any other medications which I agree on. We discussed some over-the-counter medications that could be beneficial including vitamins. Patient states she was wearing of taking these as well. Patient will try these interventions and come back again in 3 weeks for further evaluation.

## 2013-05-18 ENCOUNTER — Ambulatory Visit (INDEPENDENT_AMBULATORY_CARE_PROVIDER_SITE_OTHER): Payer: PRIVATE HEALTH INSURANCE | Admitting: Physician Assistant

## 2013-05-18 ENCOUNTER — Encounter (INDEPENDENT_AMBULATORY_CARE_PROVIDER_SITE_OTHER): Payer: Self-pay

## 2013-05-18 DIAGNOSIS — Z9884 Bariatric surgery status: Secondary | ICD-10-CM

## 2013-05-18 NOTE — Patient Instructions (Signed)
Return in one month. Focus on good food choices as well as physical activity. Return sooner if you have an increase in hunger, portion sizes or weight. Return also for difficulty swallowing, night cough, reflux. Please ask your counselor to contact me to discuss eating behaviors and possible solutions to help with these.

## 2013-05-18 NOTE — Progress Notes (Signed)
  HISTORY: Shannon [name] is a 48 y.o.female who received an AP-Large lap-band in May 2012 by Dr. Lucia Gaskins. She comes in today with stable weight since her last visit. She has lost a total of 48 lbs since surgery. She describes recent improvements in her mental health as she battles bipolar disorder. Previously she ate very little due to poor coping with stressors. Now that this has improved, she finds herself eating primarily snack foods when she gets hungry in the evening. She reports having eaten an entire half gallon of ice cream last night since she was nervous about coming in to the office today. This is fairly typical, she reports. She tends to eat the snack foods first before reaching for something more healthy. She has no complaints of regurgitation or reflux.  VITAL SIGNS: Filed Vitals:   05/18/13 1032  BP: 190/100  Pulse: 80  Temp: 98.5 F (36.9 C)  Resp: 14    PHYSICAL EXAM: Physical exam reveals a very well-appearing 48 y.o.female in no apparent distress Neurologic: Awake, alert, oriented Psych: Bright affect, conversant Respiratory: Breathing even and unlabored. No stridor or wheezing Extremities: Atraumatic, good range of motion. Skin: Warm, Dry, no rashes Musculoskeletal: Normal gait, Joints normal  ASSESMENT: 48 y.o.  female  s/p AP-Large lap-band.   PLAN: We had a lengthy discussion about food choices and how this is the most likely source of her weight loss impediment. She is seeing a counselor regularly. I recommended having her counselor contact me to investigate tools to help her make better food choices, for if these do not change, any work with the band will yield little result. She voiced understanding and agreement. We'll have her return in one month. I gave her my contact information here at the office for her counselor to contact me.

## 2013-05-30 ENCOUNTER — Ambulatory Visit (INDEPENDENT_AMBULATORY_CARE_PROVIDER_SITE_OTHER): Payer: 59 | Admitting: Psychiatry

## 2013-05-30 ENCOUNTER — Telehealth: Payer: Self-pay | Admitting: *Deleted

## 2013-05-30 DIAGNOSIS — F17200 Nicotine dependence, unspecified, uncomplicated: Secondary | ICD-10-CM

## 2013-05-30 DIAGNOSIS — F319 Bipolar disorder, unspecified: Secondary | ICD-10-CM

## 2013-05-30 NOTE — Telephone Encounter (Signed)
Spoke with pt advised of MDs message 

## 2013-05-30 NOTE — Telephone Encounter (Signed)
Pt called states she is unable to find the Thiamine and Vitamin E in 100mg .  States the medications do not come in that strength.  Please advise

## 2013-05-30 NOTE — Telephone Encounter (Signed)
Just take the dose that she can find

## 2013-06-06 ENCOUNTER — Ambulatory Visit (INDEPENDENT_AMBULATORY_CARE_PROVIDER_SITE_OTHER): Payer: 59 | Admitting: Psychiatry

## 2013-06-06 DIAGNOSIS — F17200 Nicotine dependence, unspecified, uncomplicated: Secondary | ICD-10-CM

## 2013-06-06 DIAGNOSIS — F319 Bipolar disorder, unspecified: Secondary | ICD-10-CM

## 2013-06-13 ENCOUNTER — Ambulatory Visit (INDEPENDENT_AMBULATORY_CARE_PROVIDER_SITE_OTHER): Payer: 59 | Admitting: Psychiatry

## 2013-06-13 ENCOUNTER — Ambulatory Visit (HOSPITAL_COMMUNITY)
Admission: RE | Admit: 2013-06-13 | Discharge: 2013-06-13 | Disposition: A | Discharge status: Home / Self Care | Payer: 59 | Attending: Psychiatry | Admitting: Psychiatry

## 2013-06-13 DIAGNOSIS — F17200 Nicotine dependence, unspecified, uncomplicated: Secondary | ICD-10-CM

## 2013-06-13 DIAGNOSIS — F319 Bipolar disorder, unspecified: Secondary | ICD-10-CM
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
## 2013-06-14 ENCOUNTER — Telehealth (HOSPITAL_COMMUNITY): Payer: Self-pay

## 2013-06-15 ENCOUNTER — Encounter (INDEPENDENT_AMBULATORY_CARE_PROVIDER_SITE_OTHER): Payer: PRIVATE HEALTH INSURANCE

## 2013-06-20 ENCOUNTER — Ambulatory Visit (INDEPENDENT_AMBULATORY_CARE_PROVIDER_SITE_OTHER): Payer: 59 | Admitting: Psychiatry

## 2013-06-20 DIAGNOSIS — F17200 Nicotine dependence, unspecified, uncomplicated: Secondary | ICD-10-CM

## 2013-06-20 DIAGNOSIS — F319 Bipolar disorder, unspecified: Secondary | ICD-10-CM

## 2013-06-27 ENCOUNTER — Ambulatory Visit (INDEPENDENT_AMBULATORY_CARE_PROVIDER_SITE_OTHER): Payer: 59 | Admitting: Psychiatry

## 2013-06-27 ENCOUNTER — Telehealth: Payer: Self-pay | Admitting: *Deleted

## 2013-06-27 DIAGNOSIS — F319 Bipolar disorder, unspecified: Secondary | ICD-10-CM

## 2013-06-27 DIAGNOSIS — F17200 Nicotine dependence, unspecified, uncomplicated: Secondary | ICD-10-CM

## 2013-06-27 NOTE — Telephone Encounter (Signed)
Patient Information:  Caller Name: Girtha  Phone: [phone]  Patient: Shannon [name], Shannon [name]  Gender: Female  DOB: 04/02/1965  Age: 48 Years  PCP: Charlann Boxer  Pregnant: No  Office Follow Up:  Does the office need to follow up with this patient?: Yes  Instructions For The Office: Pain is increasing with right shoulder and now radiating down arm with shaking, numbness and "crawling feeling". This is interrupting her lifestyle, had to leave [employer], cannot pray prostrate on the ground several times/day (Muslim).  RN Note:  Onset a long time ago had a right shoulder injury, recently - 3 weeks ago, right shoulder pain increased, intermittent and was seen in the office. Today, 06/27/2013 right shoulder pain increasing, radiating now and increased pain with coughing. She states, "it feels like something is crawling around". She states her Pain is Moderate - Severe she is holding the phone on her chest, she has to get in and out of clothing carefully, she has new shaking and some numbness that is constant. RN/CAN advised going to the ER, office is closed and she refused and states she would like to wait for tomorrow's appointment, 06/28/2013. She states she orginally called to see if it would be ok to use an old 2009 or so, Lidoderm patch which RN/CAN advised against since it is expired. She will call back if anything worsens.  Symptoms  Reason For Call & Symptoms: soreness on the right shoulder blade increasing and radiating now.  Reviewed Health History In EMR: Yes  Reviewed Medications In EMR: Yes  Reviewed Allergies In EMR: Yes  Reviewed Surgeries / Procedures: Yes  Date of Onset of Symptoms: 06/12/2013  Treatments Tried: Advil 500 mg q day  Treatments Tried Worked: No OB / GYN:  LMP: Unknown  Guideline(s) Used:  Arm Pain  Shoulder Injury  Disposition Per Guideline:   Go to ED Now (or to Office with PCP Approval)  Reason For Disposition Reached:   Collar bone is painful or tender to  touch  Advice Given:  Apply Heat to the Area:  Beginning 48 hours after an injury, apply a warm washcloth or heating pad for 10 minutes three times a day.  This will help increase blood flow and improve healing.  Rest vs. Movement:  Movement is generally more healing in the long term than rest.  Continue normal activities as much as your pain permits.  Avoid heavy lifting and active sports for 1-2 weeks or until the pain and swelling are gone.  Call Back If:  Pain becomes severe  You become worse.  Patient Refused Recommendation:  Patient Will Follow Up With Office Later  She refused ER disposition RN/CAN advised and will call in the am, 06/28/2013 for appointment for pain management.

## 2013-06-28 ENCOUNTER — Encounter: Payer: Self-pay | Admitting: Family Medicine

## 2013-06-28 ENCOUNTER — Other Ambulatory Visit (INDEPENDENT_AMBULATORY_CARE_PROVIDER_SITE_OTHER): Payer: Medicaid Other

## 2013-06-28 ENCOUNTER — Ambulatory Visit (INDEPENDENT_AMBULATORY_CARE_PROVIDER_SITE_OTHER): Payer: Medicaid Other | Admitting: Family Medicine

## 2013-06-28 VITALS — BP 142/94 | HR 106

## 2013-06-28 DIAGNOSIS — M19019 Primary osteoarthritis, unspecified shoulder: Secondary | ICD-10-CM

## 2013-06-28 DIAGNOSIS — M25519 Pain in unspecified shoulder: Secondary | ICD-10-CM

## 2013-06-28 DIAGNOSIS — M25511 Pain in right shoulder: Secondary | ICD-10-CM

## 2013-06-28 HISTORY — DX: Primary osteoarthritis, unspecified shoulder: M19.019

## 2013-06-28 NOTE — Progress Notes (Signed)
  [name] [address] [place], [place] [address] Phone: [phone] Subjective:    CC: Right shoulder pain followup    [id number] Shannon [name] is a 48 y.o. female coming in with acute worsening pain of her right shoulder pain. 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 Patient did have an intra-articular injection and did have some improvement for approximately 3 weeks. Patient states over the course last week she has had an extreme amount of pain that seems to be constant and is associated with some weakness of the shoulder. Patient states that no over-the-counter medications has been helpful. Patient has not tried icing. Patient has not tried any prescription medications and would like to avoid it. Patient denies any numbness but states that the pain is so severe it is waking her up at night and rates the severity is 10 out of 10 pain. Patient denies any swelling describes the pain as more of a dull aching sensation that has a sharp pain with movement.     Past medical history, social, surgical and family history all reviewed in electronic medical record.   Review of Systems: No headache, visual changes, nausea, vomiting, diarrhea, constipation, dizziness, abdominal pain, skin rash, fevers, chills, night sweats, weight loss, swollen lymph nodes, body aches, joint swelling, muscle aches, chest pain, shortness of breath, mood changes.   Objective Blood pressure 142/94, pulse 106, SpO2 97.00%.  General: No apparent distress alert and oriented x3 mood and affect normal, dressed appropriately.  HEENT: Pupils equal, extraocular movements intact  Respiratory: Patient's speak in full sentences and does not appear short of breath  Cardiovascular: No lower extremity edema, non tender, no erythema  Skin: Warm dry intact with no signs of  infection or rash on extremities or on axial skeleton.  Abdomen: Soft nontender  Neuro: Cranial nerves II through XII are intact, neurovascularly intact in all extremities with 2+ DTRs and 2+ pulses.  Lymph: No lymphadenopathy of posterior or anterior cervical chain or axillae bilaterally.  Gait normal with good balance and coordination.  MSK:  Non tender with full range of motion and good stability and symmetric strength and tone of  elbows, wrist, hip, knee and ankles bilaterally.  Shoulder: Right Inspection reveals no abnormalities, atrophy or asymmetry. Palpation is normal with no tenderness over AC joint or bicipital groove. ROM is full passively the patient does have some mild pain when you forward flex greater than 100 her abduction greater than 90. This is somewhat worse than previous exam Rotator cuff strength normal the patient does stopped prematurely secondary to pain signs of impingement with positive Neer and Hawkin's tests, empty can sign. Speeds and Yergason's tests normal. No labral pathology noted with negative Obrien's, negative clunk and good stability. Normal scapular function observed. No painful arc and no drop arm sign. No apprehension sign Contralateral side unremarkable Contralateral shoulder unremarkable.      Impression and Recommendations:     This case required medical decision making of moderate complexity.

## 2013-06-28 NOTE — Patient Instructions (Signed)
I am sorry you are in so much pain We are getting an mri of your shoulder.  You do have bad arthritis 2 injections today to help with the pain  Ice 20 minutes 2 times a day Capsacin or topical medicines over the scounter may be helpful MRI is ordered and they will be calling.  Once MRI is schedule schedule an appointment with me 1-2 days later and we will go over the images to gether

## 2013-06-28 NOTE — Assessment & Plan Note (Signed)
Patient does have underlying arthritis and did respond well but unfortunately the intra-articular injection only worked approximately 4 weeks. We discussed different treatment options including oral medications as well as IM injections which patient declined today. I do believe because patient's pain has gotten significantly worse further imaging is necessary and an MRI was ordered today to rule out rotator cuff tear. Patient's will continue icing and over-the-counter medications which we've discussed previously. Patient and will come back again in one to 2 days after the MRI and we will discuss results in further detail. All questions were answered today. Patient was put in a sling today for comfort  Spent greater than 25 minutes with patient face-to-face and had greater than 50% of counseling including as described above in assessment and plan.

## 2013-06-29 ENCOUNTER — Encounter (INDEPENDENT_AMBULATORY_CARE_PROVIDER_SITE_OTHER): Payer: Medicaid Other

## 2013-07-03 ENCOUNTER — Telehealth: Payer: Self-pay | Admitting: Family Medicine

## 2013-07-03 ENCOUNTER — Ambulatory Visit
Admission: RE | Admit: 2013-07-03 | Discharge: 2013-07-03 | Disposition: A | Discharge status: Home / Self Care | Payer: Medicaid Other | Source: Ambulatory Visit | Attending: Family Medicine | Admitting: Family Medicine

## 2013-07-03 DIAGNOSIS — M25511 Pain in right shoulder: Secondary | ICD-10-CM

## 2013-07-03 DIAGNOSIS — M75100 Unspecified rotator cuff tear or rupture of unspecified shoulder, not specified as traumatic: Secondary | ICD-10-CM

## 2013-07-03 NOTE — Telephone Encounter (Signed)
  Called and told patient she does have a large tear e=that seems to be chronic with atrophy.  May not be a candidate for repair and surgery likely need replacement.  Referred to Dr. Mardelle Matte to discuss possibility.

## 2013-07-04 ENCOUNTER — Ambulatory Visit: Payer: 59 | Admitting: Psychiatry

## 2013-07-07 ENCOUNTER — Telehealth: Payer: Self-pay

## 2013-07-07 NOTE — Telephone Encounter (Signed)
Received pharmacy rejection stating that insurance will not cover Nexium  without a prior authorization. PA submitted to insurance via cover my med's web site. Insurance decision is currently pending.

## 2013-07-10 ENCOUNTER — Ambulatory Visit: Payer: Self-pay | Admitting: Family Medicine

## 2013-07-11 ENCOUNTER — Ambulatory Visit: Payer: 59 | Admitting: Psychiatry

## 2013-07-12 ENCOUNTER — Other Ambulatory Visit: Payer: Self-pay | Admitting: Orthopedic Surgery

## 2013-07-14 ENCOUNTER — Encounter (HOSPITAL_BASED_OUTPATIENT_CLINIC_OR_DEPARTMENT_OTHER): Payer: Self-pay | Admitting: *Deleted

## 2013-07-14 NOTE — Progress Notes (Signed)
Pt hx depression/suicidal-doing better-no labs needed She will not have anyone to stay with her post op-told her she would need to stay overnight-she was not happy about that, but finally said it would be ok. To bring all meds and overnight bag

## 2013-07-21 ENCOUNTER — Ambulatory Visit (HOSPITAL_BASED_OUTPATIENT_CLINIC_OR_DEPARTMENT_OTHER): Payer: PRIVATE HEALTH INSURANCE | Admitting: Anesthesiology

## 2013-07-21 ENCOUNTER — Encounter (HOSPITAL_BASED_OUTPATIENT_CLINIC_OR_DEPARTMENT_OTHER): Payer: Self-pay

## 2013-07-21 ENCOUNTER — Ambulatory Visit (HOSPITAL_BASED_OUTPATIENT_CLINIC_OR_DEPARTMENT_OTHER)
Admission: RE | Admit: 2013-07-21 | Discharge: 2013-07-21 | Disposition: A | Discharge status: Home / Self Care | Payer: PRIVATE HEALTH INSURANCE | Source: Ambulatory Visit | Attending: Orthopedic Surgery | Admitting: Orthopedic Surgery

## 2013-07-21 ENCOUNTER — Encounter (HOSPITAL_BASED_OUTPATIENT_CLINIC_OR_DEPARTMENT_OTHER): Payer: PRIVATE HEALTH INSURANCE | Admitting: Anesthesiology

## 2013-07-21 ENCOUNTER — Encounter (HOSPITAL_BASED_OUTPATIENT_CLINIC_OR_DEPARTMENT_OTHER)
Admission: RE | Disposition: A | Discharge status: Home / Self Care | Payer: Self-pay | Source: Ambulatory Visit | Attending: Orthopedic Surgery

## 2013-07-21 DIAGNOSIS — X58XXXA Exposure to other specified factors, initial encounter: Secondary | ICD-10-CM | POA: Insufficient documentation

## 2013-07-21 DIAGNOSIS — I509 Heart failure, unspecified: Secondary | ICD-10-CM | POA: Insufficient documentation

## 2013-07-21 DIAGNOSIS — E785 Hyperlipidemia, unspecified: Secondary | ICD-10-CM | POA: Diagnosis not present

## 2013-07-21 DIAGNOSIS — I252 Old myocardial infarction: Secondary | ICD-10-CM | POA: Diagnosis not present

## 2013-07-21 DIAGNOSIS — Y929 Unspecified place or not applicable: Secondary | ICD-10-CM | POA: Diagnosis not present

## 2013-07-21 DIAGNOSIS — F319 Bipolar disorder, unspecified: Secondary | ICD-10-CM | POA: Insufficient documentation

## 2013-07-21 DIAGNOSIS — K279 Peptic ulcer, site unspecified, unspecified as acute or chronic, without hemorrhage or perforation: Secondary | ICD-10-CM | POA: Diagnosis not present

## 2013-07-21 DIAGNOSIS — I10 Essential (primary) hypertension: Secondary | ICD-10-CM | POA: Diagnosis not present

## 2013-07-21 DIAGNOSIS — Z6841 Body Mass Index (BMI) 40.0 and over, adult: Secondary | ICD-10-CM | POA: Insufficient documentation

## 2013-07-21 DIAGNOSIS — M75120 Complete rotator cuff tear or rupture of unspecified shoulder, not specified as traumatic: Secondary | ICD-10-CM | POA: Diagnosis present

## 2013-07-21 DIAGNOSIS — Z79899 Other long term (current) drug therapy: Secondary | ICD-10-CM | POA: Diagnosis not present

## 2013-07-21 DIAGNOSIS — M75121 Complete rotator cuff tear or rupture of right shoulder, not specified as traumatic: Secondary | ICD-10-CM | POA: Diagnosis present

## 2013-07-21 DIAGNOSIS — S42253A Displaced fracture of greater tuberosity of unspecified humerus, initial encounter for closed fracture: Secondary | ICD-10-CM | POA: Insufficient documentation

## 2013-07-21 DIAGNOSIS — K219 Gastro-esophageal reflux disease without esophagitis: Secondary | ICD-10-CM | POA: Insufficient documentation

## 2013-07-21 DIAGNOSIS — F17200 Nicotine dependence, unspecified, uncomplicated: Secondary | ICD-10-CM | POA: Insufficient documentation

## 2013-07-21 DIAGNOSIS — G4730 Sleep apnea, unspecified: Secondary | ICD-10-CM | POA: Insufficient documentation

## 2013-07-21 DIAGNOSIS — S43429A Sprain of unspecified rotator cuff capsule, initial encounter: Secondary | ICD-10-CM | POA: Diagnosis not present

## 2013-07-21 DIAGNOSIS — F411 Generalized anxiety disorder: Secondary | ICD-10-CM | POA: Diagnosis not present

## 2013-07-21 HISTORY — DX: Complete rotator cuff tear or rupture of right shoulder, not specified as traumatic: M75.121

## 2013-07-21 HISTORY — PX: SHOULDER ARTHROSCOPY: SHX128

## 2013-07-21 HISTORY — DX: Complete rotator cuff tear or rupture of unspecified shoulder, not specified as traumatic: M75.120

## 2013-07-21 LAB — POCT I-STAT, CHEM 8
BUN: 8 mg/dL (ref 6–23)
Calcium, Ion: 1.04 mmol/L — ABNORMAL LOW (ref 1.12–1.23)
Chloride: 106 mEq/L (ref 96–112)
Creatinine, Ser: 0.6 mg/dL (ref 0.50–1.10)
Glucose, Bld: 97 mg/dL (ref 70–99)
HCT: 41 % (ref 36.0–46.0)
Hemoglobin: 13.9 g/dL (ref 12.0–15.0)
Potassium: 3.1 mEq/L — ABNORMAL LOW (ref 3.7–5.3)
Sodium: 140 mEq/L (ref 137–147)
TCO2: 23 mmol/L (ref 0–100)

## 2013-07-21 SURGERY — ARTHROSCOPY, SHOULDER
Anesthesia: Regional | Site: Shoulder | Laterality: Right

## 2013-07-21 MED ORDER — SODIUM CHLORIDE 0.9 % IV SOLN
INTRAVENOUS | Status: DC
  Administered 2013-07-21: 12:00:00 via INTRAVENOUS

## 2013-07-21 MED ORDER — FENTANYL CITRATE 0.05 MG/ML IJ SOLN
INTRAMUSCULAR | Status: AC
  Filled 2013-07-21: qty 2

## 2013-07-21 MED ORDER — METHOCARBAMOL 500 MG PO TABS: 500.0000 mg | ORAL_TABLET | Freq: Four times a day (QID) | ORAL | Status: DC | PRN

## 2013-07-21 MED ORDER — OXYCODONE-ACETAMINOPHEN 5-325 MG PO TABS: 1.0000 | ORAL_TABLET | ORAL | Status: DC | PRN

## 2013-07-21 MED ORDER — DEXAMETHASONE SODIUM PHOSPHATE 4 MG/ML IJ SOLN
INTRAMUSCULAR | Status: DC | PRN
  Administered 2013-07-21: 10 mg via INTRAVENOUS

## 2013-07-21 MED ORDER — HYDROMORPHONE HCL PF 1 MG/ML IJ SOLN: 0.5000 mg | INTRAMUSCULAR | Status: DC | PRN

## 2013-07-21 MED ORDER — OXYCODONE HCL 5 MG PO TABS: 5.0000 mg | ORAL_TABLET | Freq: Once | ORAL | Status: DC | PRN

## 2013-07-21 MED ORDER — SUCCINYLCHOLINE CHLORIDE 20 MG/ML IJ SOLN
INTRAMUSCULAR | Status: DC | PRN
  Administered 2013-07-21: 50 mg via INTRAVENOUS

## 2013-07-21 MED ORDER — FENTANYL CITRATE 0.05 MG/ML IJ SOLN
50.0000 ug | INTRAMUSCULAR | Status: DC | PRN
  Administered 2013-07-21: 100 ug via INTRAVENOUS

## 2013-07-21 MED ORDER — SODIUM CHLORIDE 0.9 % IR SOLN
Status: DC | PRN
  Administered 2013-07-21: 12000 mL

## 2013-07-21 MED ORDER — QUETIAPINE FUMARATE ER 300 MG PO TB24: 300.0000 mg | ORAL_TABLET | Freq: Every day | ORAL | Status: DC

## 2013-07-21 MED ORDER — FENTANYL CITRATE 0.05 MG/ML IJ SOLN
INTRAMUSCULAR | Status: DC | PRN
  Administered 2013-07-21: 50 ug via INTRAVENOUS

## 2013-07-21 MED ORDER — PANTOPRAZOLE SODIUM 40 MG PO TBEC: 80.0000 mg | DELAYED_RELEASE_TABLET | Freq: Every day | ORAL | Status: DC

## 2013-07-21 MED ORDER — GABAPENTIN 600 MG PO TABS: 600.0000 mg | ORAL_TABLET | Freq: Three times a day (TID) | ORAL | Status: DC

## 2013-07-21 MED ORDER — ACETAMINOPHEN 325 MG PO TABS: 325.0000 mg | ORAL_TABLET | ORAL | Status: DC | PRN

## 2013-07-21 MED ORDER — FENTANYL CITRATE 0.05 MG/ML IJ SOLN
25.0000 ug | INTRAMUSCULAR | Status: DC | PRN
  Administered 2013-07-21: 25 ug via INTRAVENOUS

## 2013-07-21 MED ORDER — DOCUSATE SODIUM 100 MG PO CAPS: 100.0000 mg | ORAL_CAPSULE | Freq: Two times a day (BID) | ORAL | Status: DC

## 2013-07-21 MED ORDER — PROMETHAZINE HCL 25 MG/ML IJ SOLN: 6.2500 mg | INTRAMUSCULAR | Status: DC | PRN

## 2013-07-21 MED ORDER — CEFAZOLIN SODIUM 1-5 GM-% IV SOLN: 1.0000 g | Freq: Four times a day (QID) | INTRAVENOUS | Status: DC

## 2013-07-21 MED ORDER — POTASSIUM CHLORIDE CRYS ER 20 MEQ PO TBCR: 20.0000 meq | EXTENDED_RELEASE_TABLET | Freq: Three times a day (TID) | ORAL | Status: DC

## 2013-07-21 MED ORDER — METOCLOPRAMIDE HCL 5 MG/ML IJ SOLN: 5.0000 mg | Freq: Three times a day (TID) | INTRAMUSCULAR | Status: DC | PRN

## 2013-07-21 MED ORDER — MIDAZOLAM HCL 2 MG/2ML IJ SOLN
1.0000 mg | INTRAMUSCULAR | Status: DC | PRN
  Administered 2013-07-21: 2 mg via INTRAVENOUS

## 2013-07-21 MED ORDER — CLONAZEPAM 1 MG PO TABS: 1.0000 mg | ORAL_TABLET | Freq: Three times a day (TID) | ORAL | Status: DC

## 2013-07-21 MED ORDER — OXYCODONE HCL 5 MG/5ML PO SOLN: 5.0000 mg | Freq: Once | ORAL | Status: DC | PRN

## 2013-07-21 MED ORDER — OXYCODONE HCL 5 MG PO TABS: 5.0000 mg | ORAL_TABLET | ORAL | Status: DC | PRN

## 2013-07-21 MED ORDER — VITAMIN E 45 MG (100 UNIT) PO CAPS: 100.0000 [IU] | ORAL_CAPSULE | Freq: Every day | ORAL | Status: DC

## 2013-07-21 MED ORDER — LACTATED RINGERS IV SOLN
INTRAVENOUS | Status: DC
  Administered 2013-07-21: 07:00:00 via INTRAVENOUS

## 2013-07-21 MED ORDER — KETOROLAC TROMETHAMINE 30 MG/ML IJ SOLN: 15.0000 mg | Freq: Once | INTRAMUSCULAR | Status: DC | PRN

## 2013-07-21 MED ORDER — QUETIAPINE FUMARATE 50 MG PO TABS: 50.0000 mg | ORAL_TABLET | Freq: Every day | ORAL | Status: DC

## 2013-07-21 MED ORDER — OXYCODONE-ACETAMINOPHEN 10-325 MG PO TABS: 1.0000 | ORAL_TABLET | Freq: Four times a day (QID) | ORAL | Status: DC | PRN

## 2013-07-21 MED ORDER — LIDOCAINE HCL (CARDIAC) 20 MG/ML IV SOLN
INTRAVENOUS | Status: DC | PRN
  Administered 2013-07-21: 60 mg via INTRAVENOUS

## 2013-07-21 MED ORDER — METOCLOPRAMIDE HCL 5 MG PO TABS: 5.0000 mg | ORAL_TABLET | Freq: Three times a day (TID) | ORAL | Status: DC | PRN

## 2013-07-21 MED ORDER — ONDANSETRON HCL 4 MG/2ML IJ SOLN
INTRAMUSCULAR | Status: DC | PRN
  Administered 2013-07-21: 4 mg via INTRAVENOUS

## 2013-07-21 MED ORDER — METHOCARBAMOL 1000 MG/10ML IJ SOLN: 500.0000 mg | Freq: Four times a day (QID) | INTRAVENOUS | Status: DC | PRN

## 2013-07-21 MED ORDER — METHOCARBAMOL 500 MG PO TABS: 500.0000 mg | ORAL_TABLET | Freq: Four times a day (QID) | ORAL | Status: DC

## 2013-07-21 MED ORDER — FENTANYL CITRATE 0.05 MG/ML IJ SOLN: INTRAMUSCULAR | Status: DC | PRN

## 2013-07-21 MED ORDER — SENNA 8.6 MG PO TABS: 1.0000 | ORAL_TABLET | Freq: Two times a day (BID) | ORAL | Status: DC

## 2013-07-21 MED ORDER — ACETAMINOPHEN 160 MG/5ML PO SOLN: 325.0000 mg | ORAL | Status: DC | PRN

## 2013-07-21 MED ORDER — MIDAZOLAM HCL 2 MG/2ML IJ SOLN
INTRAMUSCULAR | Status: AC
  Filled 2013-07-21: qty 2

## 2013-07-21 MED ORDER — CEFAZOLIN SODIUM-DEXTROSE 2-3 GM-% IV SOLR
INTRAVENOUS | Status: AC
  Filled 2013-07-21: qty 50

## 2013-07-21 MED ORDER — PROPOFOL 10 MG/ML IV BOLUS
INTRAVENOUS | Status: DC | PRN
  Administered 2013-07-21: 160 mg via INTRAVENOUS

## 2013-07-21 MED ORDER — VITAMIN B-1 100 MG PO TABS
100.0000 mg | ORAL_TABLET | Freq: Every day | ORAL | Status: DC
Start: 2013-07-21 — End: 2013-07-21

## 2013-07-21 MED ORDER — ROPIVACAINE HCL 5 MG/ML IJ SOLN
INTRAMUSCULAR | Status: DC | PRN
  Administered 2013-07-21: 20 mL via PERINEURAL

## 2013-07-21 MED ORDER — CEFAZOLIN SODIUM-DEXTROSE 2-3 GM-% IV SOLR
2.0000 g | INTRAVENOUS | Status: AC
  Administered 2013-07-21: 2 g via INTRAVENOUS

## 2013-07-21 SURGICAL SUPPLY — 61 items
BENZOIN TINCTURE PRP APPL 2/3 (GAUZE/BANDAGES/DRESSINGS) ×2 IMPLANT
BLADE 15 SAFETY STRL DISP (BLADE) IMPLANT
BLADE CUTTER GATOR 3.5 (BLADE) ×2 IMPLANT
BLADE GREAT WHITE 4.2 (BLADE) IMPLANT
BUR OVAL 4.0 (BURR) IMPLANT
BUR OVAL 6.0 (BURR) ×2 IMPLANT
CANNULA 5.75X71 LONG (CANNULA) ×2 IMPLANT
CANNULA TWIST IN 8.25X7CM (CANNULA) IMPLANT
DECANTER SPIKE VIAL GLASS SM (MISCELLANEOUS) IMPLANT
DRAPE INCISE IOBAN 66X45 STRL (DRAPES) ×2 IMPLANT
DRAPE SHOULDER BEACH CHAIR (DRAPES) ×2 IMPLANT
DRAPE U 20/CS (DRAPES) ×2 IMPLANT
DRAPE U-SHAPE 47X51 STRL (DRAPES) ×4 IMPLANT
DRSG PAD ABDOMINAL 8X10 ST (GAUZE/BANDAGES/DRESSINGS) ×2 IMPLANT
DURAPREP 26ML APPLICATOR (WOUND CARE) ×2 IMPLANT
ELECT REM PT RETURN 9FT ADLT (ELECTROSURGICAL) ×2
ELECTRODE REM PT RTRN 9FT ADLT (ELECTROSURGICAL) ×1 IMPLANT
GAUZE SPONGE 4X4 12PLY STRL (GAUZE/BANDAGES/DRESSINGS) ×2 IMPLANT
GLOVE BIO SURGEON STRL SZ 6.5 (GLOVE) ×2 IMPLANT
GLOVE BIO SURGEON STRL SZ8 (GLOVE) ×4 IMPLANT
GLOVE BIOGEL PI IND STRL 7.0 (GLOVE) ×1 IMPLANT
GLOVE BIOGEL PI IND STRL 8 (GLOVE) ×2 IMPLANT
GLOVE BIOGEL PI INDICATOR 7.0 (GLOVE) ×1
GLOVE BIOGEL PI INDICATOR 8 (GLOVE) ×2
GLOVE EXAM NITRILE LRG STRL (GLOVE) ×2 IMPLANT
GLOVE ORTHO TXT STRL SZ7.5 (GLOVE) ×2 IMPLANT
GOWN STRL REUS W/ TWL LRG LVL3 (GOWN DISPOSABLE) ×1 IMPLANT
GOWN STRL REUS W/ TWL XL LVL3 (GOWN DISPOSABLE) ×2 IMPLANT
GOWN STRL REUS W/TWL LRG LVL3 (GOWN DISPOSABLE) ×1
GOWN STRL REUS W/TWL XL LVL3 (GOWN DISPOSABLE) ×2
IMMOBILIZER SHOULDER FOAM XLGE (SOFTGOODS) IMPLANT
IV NS IRRIG 3000ML ARTHROMATIC (IV SOLUTION) ×8 IMPLANT
KIT SHOULDER TRACTION (DRAPES) ×2 IMPLANT
MANIFOLD NEPTUNE II (INSTRUMENTS) ×2 IMPLANT
PACK ARTHROSCOPY DSU (CUSTOM PROCEDURE TRAY) ×2 IMPLANT
PACK BASIN DAY SURGERY FS (CUSTOM PROCEDURE TRAY) ×2 IMPLANT
SET ARTHROSCOPY TUBING (MISCELLANEOUS) ×1
SET ARTHROSCOPY TUBING LN (MISCELLANEOUS) ×1 IMPLANT
SHEET MEDIUM DRAPE 40X70 STRL (DRAPES) ×2 IMPLANT
SLEEVE SCD COMPRESS KNEE MED (MISCELLANEOUS) ×2 IMPLANT
SLING ARM IMMOBILIZER LRG (SOFTGOODS) IMPLANT
SLING ARM IMMOBILIZER MED (SOFTGOODS) IMPLANT
SLING ARM LRG ADULT FOAM STRAP (SOFTGOODS) IMPLANT
SLING ARM MED ADULT FOAM STRAP (SOFTGOODS) IMPLANT
SLING ARM XL FOAM STRAP (SOFTGOODS) ×2 IMPLANT
STRIP CLOSURE SKIN 1/2X4 (GAUZE/BANDAGES/DRESSINGS) ×2 IMPLANT
SUPPORT WRAP ARM LG (MISCELLANEOUS) ×2 IMPLANT
SUT FIBERWIRE #2 38 T-5 BLUE (SUTURE)
SUT MNCRL AB 4-0 PS2 18 (SUTURE) ×2 IMPLANT
SUT PDS AB 1 CT  36 (SUTURE)
SUT PDS AB 1 CT 36 (SUTURE) IMPLANT
SUT TIGER TAPE 7 IN WHITE (SUTURE) IMPLANT
SUT VIC AB 3-0 SH 27 (SUTURE)
SUT VIC AB 3-0 SH 27X BRD (SUTURE) IMPLANT
SUTURE FIBERWR #2 38 T-5 BLUE (SUTURE) IMPLANT
TAPE FIBER 2MM 7IN #2 BLUE (SUTURE) IMPLANT
TOWEL OR 17X24 6PK STRL BLUE (TOWEL DISPOSABLE) ×2 IMPLANT
TOWEL OR NON WOVEN STRL DISP B (DISPOSABLE) ×2 IMPLANT
TUBE CONNECTING 20X1/4 (TUBING) IMPLANT
WAND STAR VAC 90 (SURGICAL WAND) ×2 IMPLANT
WATER STERILE IRR 1000ML POUR (IV SOLUTION) ×2 IMPLANT

## 2013-07-21 NOTE — Discharge Instructions (Signed)
Post Anesthesia Home Care Instructions  Activity: Get plenty of rest for the remainder of the day. A responsible adult should stay with you for 24 hours following the procedure.  For the next 24 hours, DO NOT: -Drive a car -Paediatric nurse -Drink alcoholic beverages -Take any medication unless instructed by your physician -Make any legal decisions or sign important papers.  Meals: Start with liquid foods such as gelatin or soup. Progress to regular foods as tolerated. Avoid greasy, spicy, heavy foods. If nausea and/or vomiting occur, drink only clear liquids until the nausea and/or vomiting subsides. Call your physician if vomiting continues.  Special Instructions/Symptoms: Your throat may feel dry or sore from the anesthesia or the breathing tube placed in your throat during surgery. If this causes discomfort, gargle with warm salt water. The discomfort should disappear within 24 hours.   Regional Anesthesia Blocks  1. Numbness or the inability to move the "blocked" extremity may last from 3-48 hours after placement. The length of time depends on the medication injected and your individual response to the medication. If the numbness is not going away after 48 hours, call your surgeon.  2. The extremity that is blocked will need to be protected until the numbness is gone and the  Strength has returned. Because you cannot feel it, you will need to take extra care to avoid injury. Because it may be weak, you may have difficulty moving it or using it. You may not know what position it is in without looking at it while the block is in effect.  3. For blocks in the legs and feet, returning to weight bearing and walking needs to be done carefully. You will need to wait until the numbness is entirely gone and the strength has returned. You should be able to move your leg and foot normally before you try and bear weight or walk. You will need someone to be with you when you first try to ensure  you do not fall and possibly risk injury.  4. Bruising and tenderness at the needle site are common side effects and will resolve in a few days.  5. Persistent numbness or new problems with movement should be communicated to the surgeon or the [place] [phone] [place] [phone]).       Diet: As you were doing prior to hospitalization   Shower:  May shower but keep the wounds dry, use an occlusive plastic wrap, NO SOAKING IN TUB.  If the bandage gets wet, change with a clean dry gauze.  Dressing:  You may change your dressing 3-5 days after surgery.  Then change the dressing daily with sterile gauze dressing.    There are sticky tapes (steri-strips) on your wounds and all the stitches are absorbable.  Leave the steri-strips in place when changing your dressings, they will peel off with time, usually 2-3 weeks.  Activity:  Increase activity slowly as tolerated, but follow the weight bearing instructions below.  No lifting or driving for 6 weeks.  Weight Bearing:   Sling as desired.    To prevent constipation: you may use a stool softener such as -  Colace (over the counter) 100 mg by mouth twice a day  Drink plenty of fluids (prune juice may be helpful) and high fiber foods Miralax (over the counter) for constipation as needed.    Itching:  If you experience itching with your medications, try taking only a single pain pill, or even half a pain  pill at a time.  You may take up to 10 pain pills per day, and you can also use benadryl over the counter for itching or also to help with sleep.   Precautions:  If you experience chest pain or shortness of breath - call 911 immediately for transfer to the hospital emergency department!!  If you develop a fever greater that 101 F, purulent drainage from wound, increased redness or drainage from wound, or calf pain -- Call the office at [phone]                                                Follow-  Up Appointment:  Please call for an appointment to be seen in 2 weeks [place] - [phone]

## 2013-07-21 NOTE — Transfer of Care (Signed)
Immediate Anesthesia Transfer of Care Note  Patient: Shannon [name]  Procedure(s) Performed: Procedure(s): RIGHT ARTHROSCOPY SHOULDER DEBRIDMENT EXTENTSIVE,ARTHROSCOPIC REMOVE LOOSE FOREIGN BODY, BICEPS TENOLYSIS  (Right)  Patient Location: PACU  Anesthesia Type:General  Level of Consciousness: awake, alert  and oriented  Airway & Oxygen Therapy: Patient Spontanous Breathing and Patient connected to face mask oxygen  Post-op Assessment: Report given to PACU RN and Post -op Vital signs reviewed and stable  Post vital signs: Reviewed and stable  Complications: No apparent anesthesia complications

## 2013-07-21 NOTE — Anesthesia Postprocedure Evaluation (Signed)
  Anesthesia Post-op Note  Patient: Shannon [name]  Procedure(s) Performed: Procedure(s): RIGHT ARTHROSCOPY SHOULDER DEBRIDMENT EXTENTSIVE,ARTHROSCOPIC REMOVE LOOSE FOREIGN BODY, BICEPS TENOLYSIS  (Right)  Patient Location: PACU  Anesthesia Type:General and Regional  Level of Consciousness: awake and alert   Airway and Oxygen Therapy: Patient Spontanous Breathing  Post-op Pain: mild  Post-op Assessment: Post-op Vital signs reviewed, Patient's Cardiovascular Status Stable, Respiratory Function Stable, Patent Airway, No signs of Nausea or vomiting and Pain level controlled  Post-op Vital Signs: Reviewed and stable  Last Vitals:  Filed Vitals:   07/21/13 1115  BP: 139/92  Pulse: 86  Temp: 36.1 C  Resp: 16    Complications: No apparent anesthesia complications

## 2013-07-21 NOTE — Anesthesia Preprocedure Evaluation (Signed)
Anesthesia Evaluation  Patient identified by MRN, date of birth, ID band Patient awake    Reviewed: Allergy & Precautions, H&P , NPO status , Patient's Chart, lab work & pertinent test results  History of Anesthesia Complications Negative for: history of anesthetic complications  Airway Mallampati: II TM Distance: >3 FB Neck ROM: Full    Dental  (+) Teeth Intact, Missing,    Pulmonary neg sleep apnea, neg COPDneg recent URI, Current Smoker,  breath sounds clear to auscultation        Cardiovascular hypertension, Pt. on medications - angina- Past MI and - CHF - dysrhythmias - Valvular Problems/MurmursRhythm:Regular     Neuro/Psych PSYCHIATRIC DISORDERS Anxiety Bipolar Disorder    GI/Hepatic Neg liver ROS, PUD, GERD-  Medicated and Controlled,  Endo/Other  neg diabetesMorbid obesity  Renal/GU negative Renal ROS     Musculoskeletal   Abdominal   Peds  Hematology negative hematology ROS (+)   Anesthesia Other Findings   Reproductive/Obstetrics                           Anesthesia Physical Anesthesia Plan  ASA: III  Anesthesia Plan: General and Regional   Post-op Pain Management:    Induction: Intravenous  Airway Management Planned: Oral ETT  Additional Equipment: None  Intra-op Plan:   Post-operative Plan: Extubation in OR  Informed Consent: I have reviewed the patients History and Physical, chart, labs and discussed the procedure including the risks, benefits and alternatives for the proposed anesthesia with the patient or authorized representative who has indicated his/her understanding and acceptance.   Dental advisory given  Plan Discussed with: CRNA and Surgeon  Anesthesia Plan Comments:         Anesthesia Quick Evaluation

## 2013-07-21 NOTE — Op Note (Signed)
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  ANESTHESIA:   General  PREOPERATIVE INDICATIONS:  Shannon [name] is a  48 y.o. female who had a greater tuberosity fracture with retraction that was probably from 2002, who presented on a delayed basis, with pain and limited function. She failed conservative measures and elected for surgical intervention.  The risks benefits and alternatives were discussed with the patient preoperatively including but not limited to the risks of infection, bleeding, nerve injury, cardiopulmonary complications, the need for revision surgery, among others, and the patient was willing to proceed. We also discussed the risks for incomplete relief of pain, persistent weakness, progression of rotator cuff arthropathy, inability to repair the rotator cuff, among others.  OPERATIVE IMPLANTS: None  OPERATIVE FINDINGS: There was a large retracted greater tuberosity fracture, which was to the level of the glenoid, and was not mobile enough to restore it back to its native footprint. There was severe atrophy of the supraspinatus and infraspinatus and effectively no muscle belly left in either fossa. There was some glenohumeral labral fraying, and some slight chondral damage, grade 2 and grade 3 changes in the focal areas on the glenoid. The biceps tendon was in fair shape, but the labrum attachment at the supraglenoid was fairly poor. I did perform biceps tear lysis, in order to help remove a pain  generator, and optimized for postoperative pain symptoms.  OPERATIVE PROCEDURE: The patient is brought to the operating room and placed in the supine position. General anesthesia was administered. 2 g of Ancef were given. She was in the beach chair position. The right upper extremity was prepped and draped in usual sterile fashion. Time out was performed. Diagnostic arthroscopy was carried out through a posterior viewing portal and an anterior instrumentation portal. The arthroscopic shaver was used to debride the anterior labrum, as well as the superior and posterior labrum. I actually placed the shaver through the scar tissue that was between the fragment of bone and the greater tuberosity. The scar tissue had been clearly well formed, and had synovialized lining on the anterior. This is probably been that way for at least a decade.  I debrided this out, viewing from below, then went to the subacromial space, although I was actually already effectively bear. I completed the debridement, back to the residual stump of bone, and then tried to mobilize this bony fragment as best as possible to get it back to its footprint. The footprint was clearly marginated laterally. The bony piece would not get lateral enough to achieve this, and any repair would have effectively put it overlying the articular cartilage, creating actually a superior impingement site.  In addition, there is no muscular volume left in the supraspinatus infraspinatus, and the spine itself was easily visible and fairly barren.  Therefore I used a combination of the arthroscopic biter, along with pituitary graspers, and the bur, to remove a 3 x 2 cm piece of residual greater tuberosity. This was an extremely large piece. I was able to get out affectively in one piece, and completed the debridement with  the shaver. I also performed a biceps tenodesis.  Instruments were removed, the shoulder drain, and the portals closed with Monocryl followed  by Steri-Strips and sterile gauze. She tolerated the procedure well and there were no complications.

## 2013-07-21 NOTE — Anesthesia Procedure Notes (Addendum)
Procedure Name: Intubation Performed by: Terrance Mass Pre-anesthesia Checklist: Patient identified, Timeout performed, Emergency Drugs available, Suction available and Patient being monitored Patient Re-evaluated:Patient Re-evaluated prior to inductionOxygen Delivery Method: Circle system utilized Preoxygenation: Pre-oxygenation with 100% oxygen Intubation Type: IV induction Ventilation: Mask ventilation without difficulty Laryngoscope Size: Miller and 2 Tube type: Oral Number of attempts: 1 Airway Equipment and Method: Stylet Placement Confirmation: ETT inserted through vocal cords under direct vision,  breath sounds checked- equal and bilateral and positive ETCO2 Secured at: 22 cm Tube secured with: Tape Dental Injury: Teeth and Oropharynx as per pre-operative assessment     Anesthesia Regional Block:  Interscalene brachial plexus block  Pre-Anesthetic Checklist: ,, timeout performed, Correct Patient, Correct Site, Correct Laterality, Correct Procedure, Correct Position, site marked, Risks and benefits discussed,  Surgical consent,  Pre-op evaluation,  At surgeon's request and post-op pain management  Laterality: Upper and Right  Prep: chloraprep       Needles:  Injection technique: Single-shot  Needle Type: Echogenic Stimulator Needle          Additional Needles:  Procedures: ultrasound guided (picture in chart) and nerve stimulator Interscalene brachial plexus block  Nerve Stimulator or Paresthesia:  Response: deltoid, 0.4 mA,   Additional Responses:   Narrative:  Start time: 07/21/2013 7:27 AM End time: 07/21/2013 7:31 AM Injection made incrementally with aspirations every 5 mL.  Performed by: Personally  Anesthesiologist: Kallie Depolo  Additional Notes: H+P and labs reviewed, risks and benefits discussed with patient, procedure tolerated well without complications

## 2013-07-21 NOTE — H&P (Signed)
PREOPERATIVE H&P  Chief Complaint: RIGHT SHOULDER MASSIVE ROTATOR CUFF TEAR WITH ATROPHY  HPI: Shannon [name] is a 48 y.o. female who presents for preoperative history and physical with a diagnosis of RIGHT SHOULDER ROTATOR CUFF TEAR,. Symptoms are rated as moderate to severe, and have been worsening.  This is significantly impairing activities of daily living.  She has elected for surgical management. She's had an injection, which did not help, has persistent weakness, pain 4/10 but can get much worse. This all began after a fall in 2002.  Past Medical History  Diagnosis Date  . ANEMIA-IRON DEFICIENCY 01/25/2009  . HYPERLIPIDEMIA 01/25/2009  . Morbid obesity 01/25/2009  . [place] DISEASE, LUMBAR 01/25/2009  . PEPTIC ULCER DISEASE, HELICOBACTER PYLORI POSITIVE 10/03/2009  . HYPERTENSION 01/25/2009  . Chronic pain syndrome   . Allergic rhinitis   . Night sweats   . Contact lens/glasses fitting   . MANIC DEPRESSIVE ILLNESS 01/25/2009    pt is unsue if this is her specifc dx  . Bipolar 1 disorder    Past Surgical History  Procedure Laterality Date  . Bladder surgery      s/p with ?diverticulitis  . Abdominal hysterectomy    . Tubal ligation    . Hand tendon surgery  1991    s/p-Right-index and middle  . Laparoscopic gastric banding  07/14/10   History   Social History  . Marital Status: Divorced    Spouse Name: N/A    Number of Children: 2  . Years of Education: N/A   Occupational History  . STUDENT    Social History Main Topics  . Smoking status: Current Every Day Smoker -- 0.50 packs/day for 20 years    Last Attempt to Quit: 02/23/2009  . Smokeless tobacco: Never Used  . Alcohol Use: No  . Drug Use: No  . Sexual Activity: Not Currently   Other Topics Concern  . None   Social History Narrative   Moved from [place], then [place] to [place] 2009   2 children-1 boy, 1 girl   Disabled-bipolar   Daily Caffeine Use-1 cup/day   Family History  Problem Relation Age of  Onset  . Hypertension Mother   . Alcohol abuse Mother   . Stroke Father   . Cirrhosis Father     ETOH  . Hypertension Father   . Diabetes Father   . Hypertension Sister   . ADD / ADHD Son   . Hypertension Paternal Aunt   . Diabetes Maternal Grandmother   . ADD / ADHD Other     3 nephews, also emotional issues  . Diabetes Other     Uncle  . Diabetes Other     Aunt  . Hypertension Brother    No Known Allergies Prior to Admission medications   Medication Sig Start Date End Date Taking? Authorizing Provider  Cholecalciferol 50000 UNITS TABS Take 1 tablet by mouth once a week. 10/27/12  Yes Janith Lima, MD  clonazePAM (KLONOPIN) 1 MG tablet Take 1 mg by mouth 3 (three) times daily. 03/08/13  Yes Janith Lima, MD  esomeprazole (NEXIUM) 40 MG capsule Take 1 capsule (40 mg total) by mouth daily. 03/08/13  Yes Janith Lima, MD  fish oil-omega-3 fatty acids 1000 MG capsule Take 2 g by mouth daily.   Yes Historical Provider, MD  gabapentin (NEURONTIN) 600 MG tablet Take 1 tablet (600 mg total) by mouth 3 (three) times daily. 10/26/12  Yes Janith Lima, MD  Multiple Vitamin (  MULTIVITAMIN) tablet Take 1 tablet by mouth daily.   Yes Historical Provider, MD  Potassium Chloride Crys CR (KLOR-CON M20 PO) Take by mouth.   Yes Historical Provider, MD  QUEtiapine (SEROQUEL XR) 300 MG 24 hr tablet Take 300 mg by mouth at bedtime.   Yes Historical Provider, MD  QUEtiapine (SEROQUEL) 25 MG tablet take 2 tablets by mouth at bedtime 03/28/13  Yes Janith Lima, MD  thiamine (VITAMIN B-1) 100 MG tablet Take 1 tablet (100 mg total) by mouth daily. 10/30/12  Yes Janith Lima, MD  vitamin E 100 UNIT capsule Take 100 Units by mouth daily.   Yes Historical Provider, MD     Positive ROS: All other systems have been reviewed and were otherwise negative with the exception of those mentioned in the HPI and as above.  Physical Exam: General: Alert, no acute distress Cardiovascular: No pedal  edema Respiratory: No cyanosis, no use of accessory musculature GI: No organomegaly, abdomen is soft and non-tender Skin: No lesions in the area of chief complaint Neurologic: Sensation intact distally Psychiatric: Patient is competent for consent with normal mood and affect Lymphatic: No axillary or cervical lymphadenopathy  MUSCULOSKELETAL: Right shoulder active motion is 0-120, significant weakness with supraspinatus testing, infraspinatus strength is fair.  Assessment: RIGHT SHOULDER MASSIVE CHRONIC ROTATOR CUFF TEAR  Plan: Plan for Procedure(s): Right shoulder arthroscopy, possible rotator cuff tear versus debridement, removal of large fragment of bone that appears to be avulsed tuberosity.  The risks benefits and alternatives were discussed with the patient including but not limited to the risks of nonoperative treatment, versus surgical intervention including infection, bleeding, nerve injury,  blood clots, cardiopulmonary complications, morbidity, mortality, among others, and they were willing to proceed.   Johnny Bridge, MD Cell [phone]   07/21/2013 7:23 AM

## 2013-07-21 NOTE — Progress Notes (Signed)
AssistedDr. Moser with right, ultrasound guided, interscalene  block. Side rails up, monitors on throughout procedure. See vital signs in flow sheet. Tolerated Procedure well.  

## 2013-07-24 ENCOUNTER — Encounter (HOSPITAL_BASED_OUTPATIENT_CLINIC_OR_DEPARTMENT_OTHER): Payer: Self-pay | Admitting: Orthopedic Surgery

## 2013-07-25 ENCOUNTER — Ambulatory Visit: Payer: 59 | Admitting: Psychiatry

## 2013-08-01 ENCOUNTER — Ambulatory Visit: Payer: 59 | Admitting: Psychiatry

## 2013-09-21 ENCOUNTER — Ambulatory Visit (HOSPITAL_COMMUNITY): Payer: Self-pay | Admitting: Psychiatry

## 2013-09-25 ENCOUNTER — Other Ambulatory Visit: Payer: Self-pay | Admitting: Internal Medicine

## 2013-09-25 ENCOUNTER — Telehealth: Payer: Self-pay | Admitting: Internal Medicine

## 2013-09-25 NOTE — Telephone Encounter (Signed)
Patient is calling to request a refill on her QUEtiapine (SEROQUEL) 25 MG tablet. States that she is completely out. Please advise.

## 2013-09-26 ENCOUNTER — Ambulatory Visit (INDEPENDENT_AMBULATORY_CARE_PROVIDER_SITE_OTHER): Payer: PRIVATE HEALTH INSURANCE | Admitting: Psychiatry

## 2013-09-26 ENCOUNTER — Encounter (INDEPENDENT_AMBULATORY_CARE_PROVIDER_SITE_OTHER): Payer: Self-pay

## 2013-09-26 ENCOUNTER — Encounter (HOSPITAL_COMMUNITY): Payer: Self-pay | Admitting: Psychiatry

## 2013-09-26 VITALS — BP 142/90 | HR 106 | Ht 63.0 in | Wt 255.0 lb

## 2013-09-26 DIAGNOSIS — Z79899 Other long term (current) drug therapy: Secondary | ICD-10-CM

## 2013-09-26 DIAGNOSIS — F411 Generalized anxiety disorder: Secondary | ICD-10-CM

## 2013-09-26 DIAGNOSIS — F319 Bipolar disorder, unspecified: Secondary | ICD-10-CM

## 2013-09-26 MED ORDER — CLONAZEPAM 1 MG PO TABS: 1.0000 mg | ORAL_TABLET | Freq: Three times a day (TID) | ORAL | Status: DC | PRN

## 2013-09-26 MED ORDER — QUETIAPINE FUMARATE 25 MG PO TABS: 50.0000 mg | ORAL_TABLET | Freq: Every day | ORAL | Status: DC

## 2013-09-26 MED ORDER — QUETIAPINE FUMARATE ER 300 MG PO TB24: 300.0000 mg | ORAL_TABLET | Freq: Every day | ORAL | Status: DC
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
## 2013-09-26 NOTE — Patient Instructions (Signed)
Please get a EKG from PCP due to use of Seroquel

## 2013-10-01 ENCOUNTER — Other Ambulatory Visit: Payer: Self-pay | Admitting: Internal Medicine

## 2013-10-25 ENCOUNTER — Ambulatory Visit (INDEPENDENT_AMBULATORY_CARE_PROVIDER_SITE_OTHER): Payer: PRIVATE HEALTH INSURANCE | Admitting: Internal Medicine

## 2013-10-25 ENCOUNTER — Other Ambulatory Visit (INDEPENDENT_AMBULATORY_CARE_PROVIDER_SITE_OTHER): Payer: PRIVATE HEALTH INSURANCE

## 2013-10-25 ENCOUNTER — Encounter: Payer: Self-pay | Admitting: Internal Medicine

## 2013-10-25 VITALS — BP 140/84 | HR 90 | Temp 98.6°F | Resp 16 | Ht 63.0 in | Wt 259.0 lb

## 2013-10-25 DIAGNOSIS — E519 Thiamine deficiency, unspecified: Secondary | ICD-10-CM

## 2013-10-25 DIAGNOSIS — D509 Iron deficiency anemia, unspecified: Secondary | ICD-10-CM

## 2013-10-25 DIAGNOSIS — R002 Palpitations: Secondary | ICD-10-CM | POA: Insufficient documentation

## 2013-10-25 DIAGNOSIS — E559 Vitamin D deficiency, unspecified: Secondary | ICD-10-CM

## 2013-10-25 DIAGNOSIS — E785 Hyperlipidemia, unspecified: Secondary | ICD-10-CM

## 2013-10-25 DIAGNOSIS — Z23 Encounter for immunization: Secondary | ICD-10-CM

## 2013-10-25 DIAGNOSIS — Z9884 Bariatric surgery status: Secondary | ICD-10-CM

## 2013-10-25 DIAGNOSIS — Z1231 Encounter for screening mammogram for malignant neoplasm of breast: Secondary | ICD-10-CM

## 2013-10-25 DIAGNOSIS — E876 Hypokalemia: Secondary | ICD-10-CM

## 2013-10-25 DIAGNOSIS — R7309 Other abnormal glucose: Secondary | ICD-10-CM

## 2013-10-25 DIAGNOSIS — I10 Essential (primary) hypertension: Secondary | ICD-10-CM

## 2013-10-25 DIAGNOSIS — R87629 Unspecified abnormal cytological findings in specimens from vagina: Secondary | ICD-10-CM | POA: Insufficient documentation

## 2013-10-25 HISTORY — DX: Palpitations: R00.2

## 2013-10-25 LAB — LIPID PANEL
Cholesterol: 309 mg/dL — ABNORMAL HIGH (ref 0–200)
HDL: 40.9 mg/dL (ref >39.00)
LDL Cholesterol: 235 mg/dL — ABNORMAL HIGH (ref 0–99)
NonHDL: 268.1
Total CHOL/HDL Ratio: 8
Triglycerides: 167 mg/dL — ABNORMAL HIGH (ref 0.0–149.0)
VLDL: 33.4 mg/dL (ref 0.0–40.0)

## 2013-10-25 LAB — COMPREHENSIVE METABOLIC PANEL
ALT: 14 U/L (ref 0–35)
AST: 14 U/L (ref 0–37)
Albumin: 3.5 g/dL (ref 3.5–5.2)
Alkaline Phosphatase: 79 U/L (ref 39–117)
BUN: 6 mg/dL (ref 6–23)
CO2: 25 mEq/L (ref 19–32)
Calcium: 8.9 mg/dL (ref 8.4–10.5)
Chloride: 105 mEq/L (ref 96–112)
Creatinine, Ser: 0.7 mg/dL (ref 0.4–1.2)
GFR: 107.12 mL/min (ref >60.00)
Glucose, Bld: 87 mg/dL (ref 70–99)
Potassium: 3.9 mEq/L (ref 3.5–5.1)
Sodium: 138 mEq/L (ref 135–145)
Total Bilirubin: 0.9 mg/dL (ref 0.2–1.2)
Total Protein: 7.8 g/dL (ref 6.0–8.3)

## 2013-10-25 LAB — CBC WITH DIFFERENTIAL/PLATELET
Basophils Absolute: 0 10*3/uL (ref 0.0–0.1)
Basophils Relative: 0.4 % (ref 0.0–3.0)
Eosinophils Absolute: 0.1 10*3/uL (ref 0.0–0.7)
Eosinophils Relative: 2 % (ref 0.0–5.0)
HCT: 41.6 % (ref 36.0–46.0)
Hemoglobin: 13.8 g/dL (ref 12.0–15.0)
Lymphocytes Relative: 25.2 % (ref 12.0–46.0)
Lymphs Abs: 1.8 10*3/uL (ref 0.7–4.0)
MCHC: 33.3 g/dL (ref 30.0–36.0)
MCV: 85.5 fl (ref 78.0–100.0)
Monocytes Absolute: 0.4 10*3/uL (ref 0.1–1.0)
Monocytes Relative: 5.5 % (ref 3.0–12.0)
Neutro Abs: 4.8 10*3/uL (ref 1.4–7.7)
Neutrophils Relative %: 66.9 % (ref 43.0–77.0)
Platelets: 256 10*3/uL (ref 150.0–400.0)
RBC: 4.86 Mil/uL (ref 3.87–5.11)
RDW: 14.9 % (ref 11.5–15.5)
WBC: 7.1 10*3/uL (ref 4.0–10.5)

## 2013-10-25 LAB — TSH: TSH: 1.67 u[IU]/mL (ref 0.35–4.50)

## 2013-10-25 LAB — VITAMIN D 25 HYDROXY (VIT D DEFICIENCY, FRACTURES): VITD: 46.79 ng/mL (ref 30.00–100.00)

## 2013-10-25 LAB — HEMOGLOBIN A1C: Hgb A1c MFr Bld: 5.6 % (ref 4.6–6.5)

## 2013-10-25 MED ORDER — POTASSIUM CHLORIDE 20 MEQ PO PACK: 20.0000 meq | PACK | Freq: Two times a day (BID) | ORAL | Status: DC

## 2013-10-25 NOTE — Patient Instructions (Signed)
Hypertension Hypertension, commonly called high blood pressure, is when the force of blood pumping through your arteries is too strong. Your arteries are the blood vessels that carry blood from your heart throughout your body. A blood pressure reading consists of a higher number over a lower number, such as 110/72. The higher number (systolic) is the pressure inside your arteries when your heart pumps. The lower number (diastolic) is the pressure inside your arteries when your heart relaxes. Ideally you want your blood pressure below 120/80. Hypertension forces your heart to work harder to pump blood. Your arteries may become narrow or stiff. Having hypertension puts you at risk for heart disease, stroke, and other problems.  RISK FACTORS Some risk factors for high blood pressure are controllable. Others are not.  Risk factors you cannot control include:   Race. You may be at higher risk if you are African American.  Age. Risk increases with age.  Gender. Men are at higher risk than women before age 45 years. After age 65, women are at higher risk than men. Risk factors you can control include:  Not getting enough exercise or physical activity.  Being overweight.  Getting too much fat, sugar, calories, or salt in your diet.  Drinking too much alcohol. SIGNS AND SYMPTOMS Hypertension does not usually cause signs or symptoms. Extremely high blood pressure (hypertensive crisis) may cause headache, anxiety, shortness of breath, and nosebleed. DIAGNOSIS  To check if you have hypertension, your health care provider will measure your blood pressure while you are seated, with your arm held at the level of your heart. It should be measured at least twice using the same arm. Certain conditions can cause a difference in blood pressure between your right and left arms. A blood pressure reading that is higher than normal on one occasion does not mean that you need treatment. If one blood pressure reading  is high, ask your health care provider about having it checked again. TREATMENT  Treating high blood pressure includes making lifestyle changes and possibly taking medicine. Living a healthy lifestyle can help lower high blood pressure. You may need to change some of your habits. Lifestyle changes may include:  Following the DASH diet. This diet is high in fruits, vegetables, and whole grains. It is low in salt, red meat, and added sugars.  Getting at least 2 hours of brisk physical activity every week.  Losing weight if necessary.  Not smoking.  Limiting alcoholic beverages.  Learning ways to reduce stress. If lifestyle changes are not enough to get your blood pressure under control, your health care provider may prescribe medicine. You may need to take more than one. Work closely with your health care provider to understand the risks and benefits. HOME CARE INSTRUCTIONS  Have your blood pressure rechecked as directed by your health care provider.   Take medicines only as directed by your health care provider. Follow the directions carefully. Blood pressure medicines must be taken as prescribed. The medicine does not work as well when you skip doses. Skipping doses also puts you at risk for problems.   Do not smoke.   Monitor your blood pressure at home as directed by your health care provider. SEEK MEDICAL CARE IF:   You think you are having a reaction to medicines taken.  You have recurrent headaches or feel dizzy.  You have swelling in your ankles.  You have trouble with your vision. SEEK IMMEDIATE MEDICAL CARE IF:  You develop a severe headache or confusion.    You have unusual weakness, numbness, or feel faint.  You have severe chest or abdominal pain.  You vomit repeatedly.  You have trouble breathing. MAKE SURE YOU:   Understand these instructions.  Will watch your condition.  Will get help right away if you are not doing well or get worse. Document  Released: 02/09/2005 Document Revised: 06/26/2013 Document Reviewed: 12/02/2012 [facility] Patient Information 2015 [place], [place]. This information is not intended to replace advice given to you by your health care provider. Make sure you discuss any questions you have with your health care provider. Preventive Care for Adults A healthy lifestyle and preventive care can promote health and wellness. Preventive health guidelines for women include the following key practices.  A routine yearly physical is a good way to check with your health care provider about your health and preventive screening. It is a chance to share any concerns and updates on your health and to receive a thorough exam.  Visit your dentist for a routine exam and preventive care every 6 months. Brush your teeth twice a day and floss once a day. Good oral hygiene prevents tooth decay and gum disease.  The frequency of eye exams is based on your age, health, family medical history, use of contact lenses, and other factors. Follow your health care provider's recommendations for frequency of eye exams.  Eat a healthy diet. Foods like vegetables, fruits, whole grains, low-fat dairy products, and lean protein foods contain the nutrients you need without too many calories. Decrease your intake of foods high in solid fats, added sugars, and salt. Eat the right amount of calories for you.Get information about a proper diet from your health care provider, if necessary.  Regular physical exercise is one of the most important things you can do for your health. Most adults should get at least 150 minutes of moderate-intensity exercise (any activity that increases your heart rate and causes you to sweat) each week. In addition, most adults need muscle-strengthening exercises on 2 or more days a week.  Maintain a healthy weight. The body mass index (BMI) is a screening tool to identify possible weight problems. It provides an estimate of body fat  based on height and weight. Your health care provider can find your BMI and can help you achieve or maintain a healthy weight.For adults 20 years and older:  A BMI below 18.5 is considered underweight.  A BMI of 18.5 to 24.9 is normal.  A BMI of 25 to 29.9 is considered overweight.  A BMI of 30 and above is considered obese.  Maintain normal blood lipids and cholesterol levels by exercising and minimizing your intake of saturated fat. Eat a balanced diet with plenty of fruit and vegetables. Blood tests for lipids and cholesterol should begin at age 64 and be repeated every 5 years. If your lipid or cholesterol levels are high, you are over 50, or you are at high risk for heart disease, you may need your cholesterol levels checked more frequently.Ongoing high lipid and cholesterol levels should be treated with medicines if diet and exercise are not working.  If you smoke, find out from your health care provider how to quit. If you do not use tobacco, do not start.  Lung cancer screening is recommended for adults aged 6-80 years who are at high risk for developing lung cancer because of a history of smoking. A yearly low-dose CT scan of the lungs is recommended for people who have at least a 30-pack-year history of smoking  and are a current smoker or have quit within the past 15 years. A pack year of smoking is smoking an average of 1 pack of cigarettes a day for 1 year (for example: 1 pack a day for 30 years or 2 packs a day for 15 years). Yearly screening should continue until the smoker has stopped smoking for at least 15 years. Yearly screening should be stopped for people who develop a health problem that would prevent them from having lung cancer treatment.  If you are pregnant, do not drink alcohol. If you are breastfeeding, be very cautious about drinking alcohol. If you are not pregnant and choose to drink alcohol, do not have more than 1 drink per day. One drink is considered to be 12  ounces (355 mL) of beer, 5 ounces (148 mL) of wine, or 1.5 ounces (44 mL) of liquor.  Avoid use of street drugs. Do not share needles with anyone. Ask for help if you need support or instructions about stopping the use of drugs.  High blood pressure causes heart disease and increases the risk of stroke. Your blood pressure should be checked at least every 1 to 2 years. Ongoing high blood pressure should be treated with medicines if weight loss and exercise do not work.  If you are 54-39 years old, ask your health care provider if you should take aspirin to prevent strokes.  Diabetes screening involves taking a blood sample to check your fasting blood sugar level. This should be done once every 3 years, after age 66, if you are within normal weight and without risk factors for diabetes. Testing should be considered at a younger age or be carried out more frequently if you are overweight and have at least 1 risk factor for diabetes.  Breast cancer screening is essential preventive care for women. You should practice "breast self-awareness." This means understanding the normal appearance and feel of your breasts and may include breast self-examination. Any changes detected, no matter how small, should be reported to a health care provider. Women in their 71s and 30s should have a clinical breast exam (CBE) by a health care provider as part of a regular health exam every 1 to 3 years. After age 42, women should have a CBE every year. Starting at age 47, women should consider having a mammogram (breast X-ray test) every year. Women who have a family history of breast cancer should talk to their health care provider about genetic screening. Women at a high risk of breast cancer should talk to their health care providers about having an MRI and a mammogram every year.  Breast cancer gene (BRCA)-related cancer risk assessment is recommended for women who have family members with BRCA-related cancers.  BRCA-related cancers include breast, ovarian, tubal, and peritoneal cancers. Having family members with these cancers may be associated with an increased risk for harmful changes (mutations) in the breast cancer genes BRCA1 and BRCA2. Results of the assessment will determine the need for genetic counseling and BRCA1 and BRCA2 testing.  Routine pelvic exams to screen for cancer are no longer recommended for nonpregnant women who are considered low risk for cancer of the pelvic organs (ovaries, uterus, and vagina) and who do not have symptoms. Ask your health care provider if a screening pelvic exam is right for you.  If you have had past treatment for cervical cancer or a condition that could lead to cancer, you need Pap tests and screening for cancer for at least 20 years after  your treatment. If Pap tests have been discontinued, your risk factors (such as having a new sexual partner) need to be reassessed to determine if screening should be resumed. Some women have medical problems that increase the chance of getting cervical cancer. In these cases, your health care provider may recommend more frequent screening and Pap tests.  The HPV test is an additional test that may be used for cervical cancer screening. The HPV test looks for the virus that can cause the cell changes on the cervix. The cells collected during the Pap test can be tested for HPV. The HPV test could be used to screen women aged 71 years and older, and should be used in women of any age who have unclear Pap test results. After the age of 78, women should have HPV testing at the same frequency as a Pap test.  Colorectal cancer can be detected and often prevented. Most routine colorectal cancer screening begins at the age of 62 years and continues through age 65 years. However, your health care provider may recommend screening at an earlier age if you have risk factors for colon cancer. On a yearly basis, your health care provider may  provide home test kits to check for hidden blood in the stool. Use of a small camera at the end of a tube, to directly examine the colon (sigmoidoscopy or colonoscopy), can detect the earliest forms of colorectal cancer. Talk to your health care provider about this at age 66, when routine screening begins. Direct exam of the colon should be repeated every 5-10 years through age 5 years, unless early forms of pre-cancerous polyps or small growths are found.  People who are at an increased risk for hepatitis B should be screened for this virus. You are considered at high risk for hepatitis B if:  You were born in a country where hepatitis B occurs often. Talk with your health care provider about which countries are considered high risk.  Your parents were born in a high-risk country and you have not received a shot to protect against hepatitis B (hepatitis B vaccine).  You have HIV or AIDS.  You use needles to inject street drugs.  You live with, or have sex with, someone who has hepatitis B.  You get hemodialysis treatment.  You take certain medicines for conditions like cancer, organ transplantation, and autoimmune conditions.  Hepatitis C blood testing is recommended for all people born from [birth year] through 1966 and any individual with known risks for hepatitis C.  Practice safe sex. Use condoms and avoid high-risk sexual practices to reduce the spread of sexually transmitted infections (STIs). STIs include gonorrhea, chlamydia, syphilis, trichomonas, herpes, HPV, and human immunodeficiency virus (HIV). Herpes, HIV, and HPV are viral illnesses that have no cure. They can result in disability, cancer, and death.  You should be screened for sexually transmitted illnesses (STIs) including gonorrhea and chlamydia if:  You are sexually active and are younger than 24 years.  You are older than 24 years and your health care provider tells you that you are at risk for this type of  infection.  Your sexual activity has changed since you were last screened and you are at an increased risk for chlamydia or gonorrhea. Ask your health care provider if you are at risk.  If you are at risk of being infected with HIV, it is recommended that you take a prescription medicine daily to prevent HIV infection. This is called preexposure prophylaxis (PrEP). You are considered  at risk if:  You are a heterosexual woman, are sexually active, and are at increased risk for HIV infection.  You take drugs by injection.  You are sexually active with a partner who has HIV.  Talk with your health care provider about whether you are at high risk of being infected with HIV. If you choose to begin PrEP, you should first be tested for HIV. You should then be tested every 3 months for as long as you are taking PrEP.  Osteoporosis is a disease in which the bones lose minerals and strength with aging. This can result in serious bone fractures or breaks. The risk of osteoporosis can be identified using a bone density scan. Women ages 16 years and over and women at risk for fractures or osteoporosis should discuss screening with their health care providers. Ask your health care provider whether you should take a calcium supplement or vitamin D to reduce the rate of osteoporosis.  Menopause can be associated with physical symptoms and risks. Hormone replacement therapy is available to decrease symptoms and risks. You should talk to your health care provider about whether hormone replacement therapy is right for you.  Use sunscreen. Apply sunscreen liberally and repeatedly throughout the day. You should seek shade when your shadow is shorter than you. Protect yourself by wearing long sleeves, pants, a wide-brimmed hat, and sunglasses year round, whenever you are outdoors.  Once a month, do a whole body skin exam, using a mirror to look at the skin on your back. Tell your health care provider of new moles,  moles that have irregular borders, moles that are larger than a pencil eraser, or moles that have changed in shape or color.  Stay current with required vaccines (immunizations).  Influenza vaccine. All adults should be immunized every year.  Tetanus, diphtheria, and acellular pertussis (Td, Tdap) vaccine. Pregnant women should receive 1 dose of Tdap vaccine during each pregnancy. The dose should be obtained regardless of the length of time since the last dose. Immunization is preferred during the 27th-36th week of gestation. An adult who has not previously received Tdap or who does not know her vaccine status should receive 1 dose of Tdap. This initial dose should be followed by tetanus and diphtheria toxoids (Td) booster doses every 10 years. Adults with an unknown or incomplete history of completing a 3-dose immunization series with Td-containing vaccines should begin or complete a primary immunization series including a Tdap dose. Adults should receive a Td booster every 10 years.  Varicella vaccine. An adult without evidence of immunity to varicella should receive 2 doses or a second dose if she has previously received 1 dose. Pregnant females who do not have evidence of immunity should receive the first dose after pregnancy. This first dose should be obtained before leaving the health care facility. The second dose should be obtained 4-8 weeks after the first dose.  Human papillomavirus (HPV) vaccine. Females aged 13-26 years who have not received the vaccine previously should obtain the 3-dose series. The vaccine is not recommended for use in pregnant females. However, pregnancy testing is not needed before receiving a dose. If a female is found to be pregnant after receiving a dose, no treatment is needed. In that case, the remaining doses should be delayed until after the pregnancy. Immunization is recommended for any person with an immunocompromised condition through the age of 95 years if she  did not get any or all doses earlier. During the 3-dose series, the second  dose should be obtained 4-8 weeks after the first dose. The third dose should be obtained 24 weeks after the first dose and 16 weeks after the second dose.  Zoster vaccine. One dose is recommended for adults aged 86 years or older unless certain conditions are present.  Measles, mumps, and rubella (MMR) vaccine. Adults born before [birth year] generally are considered immune to measles and mumps. Adults born in [birth year] or later should have 1 or more doses of MMR vaccine unless there is a contraindication to the vaccine or there is laboratory evidence of immunity to each of the three diseases. A routine second dose of MMR vaccine should be obtained at least 28 days after the first dose for students attending postsecondary schools, health care workers, or international travelers. People who received inactivated measles vaccine or an unknown type of measles vaccine during 1963-1967 should receive 2 doses of MMR vaccine. People who received inactivated mumps vaccine or an unknown type of mumps vaccine before 1979 and are at high risk for mumps infection should consider immunization with 2 doses of MMR vaccine. For females of childbearing age, rubella immunity should be determined. If there is no evidence of immunity, females who are not pregnant should be vaccinated. If there is no evidence of immunity, females who are pregnant should delay immunization until after pregnancy. Unvaccinated health care workers born before [birth year] who lack laboratory evidence of measles, mumps, or rubella immunity or laboratory confirmation of disease should consider measles and mumps immunization with 2 doses of MMR vaccine or rubella immunization with 1 dose of MMR vaccine.  Pneumococcal 13-valent conjugate (PCV13) vaccine. When indicated, a person who is uncertain of her immunization history and has no record of immunization should receive the PCV13 vaccine. An adult  aged 74 years or older who has certain medical conditions and has not been previously immunized should receive 1 dose of PCV13 vaccine. This PCV13 should be followed with a dose of pneumococcal polysaccharide (PPSV23) vaccine. The PPSV23 vaccine dose should be obtained at least 8 weeks after the dose of PCV13 vaccine. An adult aged 53 years or older who has certain medical conditions and previously received 1 or more doses of PPSV23 vaccine should receive 1 dose of PCV13. The PCV13 vaccine dose should be obtained 1 or more years after the last PPSV23 vaccine dose.  Pneumococcal polysaccharide (PPSV23) vaccine. When PCV13 is also indicated, PCV13 should be obtained first. All adults aged 25 years and older should be immunized. An adult younger than age 79 years who has certain medical conditions should be immunized. Any person who resides in a nursing home or long-term care facility should be immunized. An adult smoker should be immunized. People with an immunocompromised condition and certain other conditions should receive both PCV13 and PPSV23 vaccines. People with human immunodeficiency virus (HIV) infection should be immunized as soon as possible after diagnosis. Immunization during chemotherapy or radiation therapy should be avoided. Routine use of PPSV23 vaccine is not recommended for American Indians, [place] Natives, or people younger than 65 years unless there are medical conditions that require PPSV23 vaccine. When indicated, people who have unknown immunization and have no record of immunization should receive PPSV23 vaccine. One-time revaccination 5 years after the first dose of PPSV23 is recommended for people aged 19-64 years who have chronic kidney failure, nephrotic syndrome, asplenia, or immunocompromised conditions. People who received 1-2 doses of PPSV23 before age 61 years should receive another dose of PPSV23 vaccine at age 31 years or later if  at least 5 years have passed since the previous  dose. Doses of PPSV23 are not needed for people immunized with PPSV23 at or after age 59 years.  Meningococcal vaccine. Adults with asplenia or persistent complement component deficiencies should receive 2 doses of quadrivalent meningococcal conjugate (MenACWY-D) vaccine. The doses should be obtained at least 2 months apart. Microbiologists working with certain meningococcal bacteria, [employer] recruits, people at risk during an outbreak, and people who travel to or live in countries with a high rate of meningitis should be immunized. A first-year college student up through age 13 years who is living in a residence hall should receive a dose if she did not receive a dose on or after her 16th birthday. Adults who have certain high-risk conditions should receive one or more doses of vaccine.  Hepatitis A vaccine. Adults who wish to be protected from this disease, have certain high-risk conditions, work with hepatitis A-infected animals, work in hepatitis A research labs, or travel to or work in countries with a high rate of hepatitis A should be immunized. Adults who were previously unvaccinated and who anticipate close contact with an international adoptee during the first 60 days after arrival in the [place] from a country with a high rate of hepatitis A should be immunized.  Hepatitis B vaccine. Adults who wish to be protected from this disease, have certain high-risk conditions, may be exposed to blood or other infectious body fluids, are household contacts or sex partners of hepatitis B positive people, are clients or workers in certain care facilities, or travel to or work in countries with a high rate of hepatitis B should be immunized.  Haemophilus influenzae type b (Hib) vaccine. A previously unvaccinated person with asplenia or sickle cell disease or having a scheduled splenectomy should receive 1 dose of Hib vaccine. Regardless of previous immunization, a recipient of a hematopoietic stem cell  transplant should receive a 3-dose series 6-12 months after her successful transplant. Hib vaccine is not recommended for adults with HIV infection. Preventive Services / Frequency Ages 69 to 28 years  Blood pressure check.** / Every 1 to 2 years.  Lipid and cholesterol check.** / Every 5 years beginning at age 49.  Clinical breast exam.** / Every 3 years for women in their 61s and 27s.  BRCA-related cancer risk assessment.** / For women who have family members with a BRCA-related cancer (breast, ovarian, tubal, or peritoneal cancers).  Pap test.** / Every 2 years from ages 64 through 14. Every 3 years starting at age 35 through age 65 or 6 with a history of 3 consecutive normal Pap tests.  HPV screening.** / Every 3 years from ages 37 through ages 50 to 85 with a history of 3 consecutive normal Pap tests.  Hepatitis C blood test.** / For any individual with known risks for hepatitis C.  Skin self-exam. / Monthly.  Influenza vaccine. / Every year.  Tetanus, diphtheria, and acellular pertussis (Tdap, Td) vaccine.** / Consult your health care provider. Pregnant women should receive 1 dose of Tdap vaccine during each pregnancy. 1 dose of Td every 10 years.  Varicella vaccine.** / Consult your health care provider. Pregnant females who do not have evidence of immunity should receive the first dose after pregnancy.  HPV vaccine. / 3 doses over 6 months, if 50 and younger. The vaccine is not recommended for use in pregnant females. However, pregnancy testing is not needed before receiving a dose.  Measles, mumps, rubella (MMR) vaccine.** / You need  at least 1 dose of MMR if you were born in 1957 or later. You may also need a 2nd dose. For females of childbearing age, rubella immunity should be determined. If there is no evidence of immunity, females who are not pregnant should be vaccinated. If there is no evidence of immunity, females who are pregnant should delay immunization until after  pregnancy.  Pneumococcal 13-valent conjugate (PCV13) vaccine.** / Consult your health care provider.  Pneumococcal polysaccharide (PPSV23) vaccine.** / 1 to 2 doses if you smoke cigarettes or if you have certain conditions.  Meningococcal vaccine.** / 1 dose if you are age 27 to 78 years and a Market researcher living in a residence hall, or have one of several medical conditions, you need to get vaccinated against meningococcal disease. You may also need additional booster doses.  Hepatitis A vaccine.** / Consult your health care provider.  Hepatitis B vaccine.** / Consult your health care provider.  Haemophilus influenzae type b (Hib) vaccine.** / Consult your health care provider. Ages 86 to 75 years  Blood pressure check.** / Every 1 to 2 years.  Lipid and cholesterol check.** / Every 5 years beginning at age 69 years.  Lung cancer screening. / Every year if you are aged 28-80 years and have a 30-pack-year history of smoking and currently smoke or have quit within the past 15 years. Yearly screening is stopped once you have quit smoking for at least 15 years or develop a health problem that would prevent you from having lung cancer treatment.  Clinical breast exam.** / Every year after age 45 years.  BRCA-related cancer risk assessment.** / For women who have family members with a BRCA-related cancer (breast, ovarian, tubal, or peritoneal cancers).  Mammogram.** / Every year beginning at age 71 years and continuing for as long as you are in good health. Consult with your health care provider.  Pap test.** / Every 3 years starting at age 67 years through age 4 or 11 years with a history of 3 consecutive normal Pap tests.  HPV screening.** / Every 3 years from ages 8 years through ages 8 to 47 years with a history of 3 consecutive normal Pap tests.  Fecal occult blood test (FOBT) of stool. / Every year beginning at age 26 years and continuing until age 72 years. You may  not need to do this test if you get a colonoscopy every 10 years.  Flexible sigmoidoscopy or colonoscopy.** / Every 5 years for a flexible sigmoidoscopy or every 10 years for a colonoscopy beginning at age 38 years and continuing until age 12 years.  Hepatitis C blood test.** / For all people born from [birth year] through 1966 and any individual with known risks for hepatitis C.  Skin self-exam. / Monthly.  Influenza vaccine. / Every year.  Tetanus, diphtheria, and acellular pertussis (Tdap/Td) vaccine.** / Consult your health care provider. Pregnant women should receive 1 dose of Tdap vaccine during each pregnancy. 1 dose of Td every 10 years.  Varicella vaccine.** / Consult your health care provider. Pregnant females who do not have evidence of immunity should receive the first dose after pregnancy.  Zoster vaccine.** / 1 dose for adults aged 80 years or older.  Measles, mumps, rubella (MMR) vaccine.** / You need at least 1 dose of MMR if you were born in 1957 or later. You may also need a 2nd dose. For females of childbearing age, rubella immunity should be determined. If there is no evidence of immunity, females who  are not pregnant should be vaccinated. If there is no evidence of immunity, females who are pregnant should delay immunization until after pregnancy.  Pneumococcal 13-valent conjugate (PCV13) vaccine.** / Consult your health care provider.  Pneumococcal polysaccharide (PPSV23) vaccine.** / 1 to 2 doses if you smoke cigarettes or if you have certain conditions.  Meningococcal vaccine.** / Consult your health care provider.  Hepatitis A vaccine.** / Consult your health care provider.  Hepatitis B vaccine.** / Consult your health care provider.  Haemophilus influenzae type b (Hib) vaccine.** / Consult your health care provider. Ages 11 years and over  Blood pressure check.** / Every 1 to 2 years.  Lipid and cholesterol check.** / Every 5 years beginning at age 36 years.  Lung  cancer screening. / Every year if you are aged 73-80 years and have a 30-pack-year history of smoking and currently smoke or have quit within the past 15 years. Yearly screening is stopped once you have quit smoking for at least 15 years or develop a health problem that would prevent you from having lung cancer treatment.  Clinical breast exam.** / Every year after age 29 years.  BRCA-related cancer risk assessment.** / For women who have family members with a BRCA-related cancer (breast, ovarian, tubal, or peritoneal cancers).  Mammogram.** / Every year beginning at age 18 years and continuing for as long as you are in good health. Consult with your health care provider.  Pap test.** / Every 3 years starting at age 7 years through age 32 or 58 years with 3 consecutive normal Pap tests. Testing can be stopped between 65 and 70 years with 3 consecutive normal Pap tests and no abnormal Pap or HPV tests in the past 10 years.  HPV screening.** / Every 3 years from ages 33 years through ages 61 or 77 years with a history of 3 consecutive normal Pap tests. Testing can be stopped between 65 and 70 years with 3 consecutive normal Pap tests and no abnormal Pap or HPV tests in the past 10 years.  Fecal occult blood test (FOBT) of stool. / Every year beginning at age 27 years and continuing until age 34 years. You may not need to do this test if you get a colonoscopy every 10 years.  Flexible sigmoidoscopy or colonoscopy.** / Every 5 years for a flexible sigmoidoscopy or every 10 years for a colonoscopy beginning at age 60 years and continuing until age 59 years.  Hepatitis C blood test.** / For all people born from [birth year] through 1966 and any individual with known risks for hepatitis C.  Osteoporosis screening.** / A one-time screening for women ages 53 years and over and women at risk for fractures or osteoporosis.  Skin self-exam. / Monthly.  Influenza vaccine. / Every year.  Tetanus, diphtheria, and  acellular pertussis (Tdap/Td) vaccine.** / 1 dose of Td every 10 years.  Varicella vaccine.** / Consult your health care provider.  Zoster vaccine.** / 1 dose for adults aged 38 years or older.  Pneumococcal 13-valent conjugate (PCV13) vaccine.** / Consult your health care provider.  Pneumococcal polysaccharide (PPSV23) vaccine.** / 1 dose for all adults aged 8 years and older.  Meningococcal vaccine.** / Consult your health care provider.  Hepatitis A vaccine.** / Consult your health care provider.  Hepatitis B vaccine.** / Consult your health care provider.  Haemophilus influenzae type b (Hib) vaccine.** / Consult your health care provider. ** Family history and personal history of risk and conditions may change your health care provider's  recommendations. Document Released: 04/07/2001 Document Revised: 06/26/2013 Document Reviewed: 07/07/2010 [facility] Patient Information 2015 [place], [place]. This information is not intended to replace advice given to you by your health care provider. Make sure you discuss any questions you have with your health care provider.

## 2013-10-25 NOTE — Progress Notes (Signed)
Subjective:    Patient ID: Shannon [name], female    DOB: [birth year], 48 y.o.   MRN: [id number]  Hypertension This is a chronic problem. The current episode started more than 1 year ago. The problem is unchanged. The problem is controlled. Associated symptoms include anxiety and palpitations (occasional fluttering). Pertinent negatives include no blurred vision, chest pain, headaches, malaise/fatigue, neck pain, orthopnea, peripheral edema, PND, shortness of breath or sweats. There are no associated agents to hypertension. Risk factors for coronary artery disease include smoking/tobacco exposure and obesity. Past treatments include lifestyle changes. The current treatment provides moderate improvement. Compliance problems include diet and exercise.       Review of Systems  Constitutional: Negative.  Negative for fever, chills, malaise/fatigue, diaphoresis and fatigue.  HENT: Negative.   Eyes: Negative.  Negative for blurred vision.  Respiratory: Negative.  Negative for apnea, cough, chest tightness, shortness of breath, wheezing and stridor.   Cardiovascular: Positive for palpitations (occasional fluttering). Negative for chest pain, orthopnea, leg swelling and PND.  Gastrointestinal: Negative.  Negative for nausea, vomiting, abdominal pain, diarrhea, constipation and blood in stool.  Endocrine: Negative.   Genitourinary: Negative.   Musculoskeletal: Negative.  Negative for arthralgias, back pain, gait problem, joint swelling, myalgias, neck pain and neck stiffness.  Skin: Negative.  Negative for rash.  Allergic/Immunologic: Negative.   Neurological: Negative.  Negative for headaches.  Hematological: Negative.  Negative for adenopathy. Does not bruise/bleed easily.  Psychiatric/Behavioral: Negative.        Objective:   Physical Exam  Vitals reviewed. Constitutional: She is oriented to person, place, and time. She appears well-developed and well-nourished. No distress.  HENT:    Head: Normocephalic and atraumatic.  Mouth/Throat: Oropharynx is clear and moist. No oropharyngeal exudate.  Eyes: Conjunctivae are normal. Right eye exhibits no discharge. Left eye exhibits no discharge. No scleral icterus.  Neck: Normal range of motion. Neck supple. No JVD present. No tracheal deviation present. No thyromegaly present.  Cardiovascular: Normal rate, regular rhythm, normal heart sounds and intact distal pulses.  Exam reveals no gallop and no friction rub.   No murmur heard. Pulmonary/Chest: Effort normal. No stridor. No respiratory distress. She has no wheezes. She has no rales. Chest wall is not dull to percussion. She exhibits no mass, no tenderness, no bony tenderness, no laceration, no crepitus, no edema, no deformity, no swelling and no retraction. Right breast exhibits no inverted nipple, no mass, no nipple discharge, no skin change and no tenderness. Left breast exhibits no inverted nipple, no mass, no nipple discharge, no skin change and no tenderness. Breasts are symmetrical.  Abdominal: Soft. Bowel sounds are normal. She exhibits no distension and no mass. There is no tenderness. There is no rebound and no guarding.  Musculoskeletal: Normal range of motion. She exhibits no edema.  Lymphadenopathy:    She has no cervical adenopathy.  Neurological: She is oriented to person, place, and time.  Skin: Skin is warm and dry. No rash noted. She is not diaphoretic. No erythema. No pallor.  Psychiatric: She has a normal mood and affect. Her behavior is normal. Judgment and thought content normal.    Lab Results  Component Value Date   WBC 6.3 10/26/2012   HGB 13.9 07/21/2013   HCT 41.0 07/21/2013   PLT 277.0 10/26/2012   GLUCOSE 97 07/21/2013   CHOL 246* 10/26/2012   TRIG 53.0 10/26/2012   HDL 48.60 10/26/2012   LDLDIRECT 195.6 10/26/2012   ALT 9 07/11/2012   AST 10  07/11/2012   NA 140 07/21/2013   K 3.1* 07/21/2013   CL 106 07/21/2013   CREATININE 0.60 07/21/2013   BUN 8 07/21/2013    CO2 25 10/26/2012   TSH 1.91 10/26/2012   HGBA1C 5.2 10/26/2012        Assessment & Plan:

## 2013-10-25 NOTE — Progress Notes (Signed)
Pre visit review using our clinic review tool, if applicable. No additional management support is needed unless otherwise documented below in the visit note. 

## 2013-10-26 ENCOUNTER — Telehealth: Payer: Self-pay | Admitting: Internal Medicine

## 2013-10-26 MED ORDER — POTASSIUM CHLORIDE 20 MEQ/15ML (10%) PO SOLN: 20.0000 meq | Freq: Two times a day (BID) | ORAL | Status: DC

## 2013-10-26 NOTE — Telephone Encounter (Signed)
Change to liquid form @ same dose

## 2013-10-26 NOTE — Telephone Encounter (Signed)
Pt called stated that Dr. Ronnald Ramp send in potassium for her to take, pt stated the pill is too large, even when she cut in half. Dr. Ronnald Ramp told her that he will send in liquid kind for her but when she pick it up it was the pill kind. Please help, Dr. Ronnald Ramp is not in the office.

## 2013-10-26 NOTE — Telephone Encounter (Signed)
Notified pt [facility]

## 2013-10-27 NOTE — Assessment & Plan Note (Signed)
This has improved.

## 2013-10-27 NOTE — Assessment & Plan Note (Signed)
Will recheck her Vit D level today

## 2013-10-27 NOTE — Assessment & Plan Note (Signed)
She tells me that her last smear was abnormal, will send to GYN for further evaluation

## 2013-10-27 NOTE — Assessment & Plan Note (Signed)
Will recheck her K+ level today 

## 2013-10-27 NOTE — Assessment & Plan Note (Signed)
Our EKG machine malfunctioned today but the leads we do have are normal I will check her labs for secondary causes of palpitations and have asked her to do an event monitor

## 2013-10-27 NOTE — Assessment & Plan Note (Signed)
Her BP is well controlled 

## 2013-10-28 LAB — VITAMIN B1: Vitamin B1 (Thiamine): 7 nmol/L — ABNORMAL LOW (ref 8–30)

## 2013-10-30 ENCOUNTER — Encounter: Payer: Self-pay | Admitting: Internal Medicine

## 2013-11-14 ENCOUNTER — Encounter: Payer: Self-pay | Admitting: *Deleted

## 2013-11-14 ENCOUNTER — Encounter (INDEPENDENT_AMBULATORY_CARE_PROVIDER_SITE_OTHER): Payer: PRIVATE HEALTH INSURANCE

## 2013-11-14 DIAGNOSIS — R002 Palpitations: Secondary | ICD-10-CM

## 2013-11-14 NOTE — Progress Notes (Signed)
Patient ID: Shannon [name], female   DOB: Nov 24, 1965, 48 y.o.   MRN: [id number] [employer] 30 day cardiac event monitor applied to patient.

## 2013-11-15 ENCOUNTER — Telehealth: Payer: Self-pay

## 2013-11-15 DIAGNOSIS — F3160 Bipolar disorder, current episode mixed, unspecified: Secondary | ICD-10-CM

## 2013-11-15 DIAGNOSIS — F419 Anxiety disorder, unspecified: Secondary | ICD-10-CM

## 2013-11-15 MED ORDER — GABAPENTIN 600 MG PO TABS: 600.0000 mg | ORAL_TABLET | Freq: Three times a day (TID) | ORAL | Status: DC

## 2013-11-15 NOTE — Telephone Encounter (Signed)
Refill done for gabapentin 600 mg 1 tab po tid, qty 90 w/ 11 rfs

## 2013-11-20 ENCOUNTER — Other Ambulatory Visit: Payer: Self-pay

## 2013-11-20 DIAGNOSIS — K279 Peptic ulcer, site unspecified, unspecified as acute or chronic, without hemorrhage or perforation: Secondary | ICD-10-CM

## 2013-11-20 MED ORDER — ESOMEPRAZOLE MAGNESIUM 40 MG PO CPDR: 40.0000 mg | DELAYED_RELEASE_CAPSULE | Freq: Every day | ORAL | Status: DC

## 2013-11-29 ENCOUNTER — Telehealth (HOSPITAL_COMMUNITY): Payer: Self-pay

## 2013-11-29 ENCOUNTER — Telehealth: Payer: Self-pay | Admitting: Internal Medicine

## 2013-11-29 DIAGNOSIS — F411 Generalized anxiety disorder: Secondary | ICD-10-CM

## 2013-11-29 DIAGNOSIS — E559 Vitamin D deficiency, unspecified: Secondary | ICD-10-CM

## 2013-11-29 MED ORDER — CHOLECALCIFEROL 1.25 MG (50000 UT) PO TABS: 1.0000 | ORAL_TABLET | ORAL | Status: DC

## 2013-11-29 NOTE — Telephone Encounter (Signed)
Patient stated she would like for you to call a prescription in for her cholesterol, she has questions about her Vit-D

## 2013-11-30 ENCOUNTER — Other Ambulatory Visit: Payer: Self-pay | Admitting: Internal Medicine

## 2013-11-30 ENCOUNTER — Telehealth: Payer: Self-pay | Admitting: Internal Medicine

## 2013-11-30 DIAGNOSIS — E785 Hyperlipidemia, unspecified: Secondary | ICD-10-CM

## 2013-11-30 DIAGNOSIS — E519 Thiamine deficiency, unspecified: Secondary | ICD-10-CM

## 2013-11-30 MED ORDER — EZETIMIBE-SIMVASTATIN 10-40 MG PO TABS: 1.0000 | ORAL_TABLET | Freq: Every day | ORAL | Status: DC

## 2013-11-30 MED ORDER — DIAZEPAM 5 MG PO TABS: 5.0000 mg | ORAL_TABLET | Freq: Two times a day (BID) | ORAL | Status: DC | PRN

## 2013-11-30 MED ORDER — VITAMIN B-1 100 MG PO TABS: 100.0000 mg | ORAL_TABLET | Freq: Every day | ORAL | Status: DC

## 2013-11-30 NOTE — Telephone Encounter (Signed)
Called pt with md response. Pt stated she received a letter from md stating that her cholesterol was elevated but didn't rx anything, and that why she called. Inform pt md sent vytorin in this am. Also she stated her vitamin B was elevated and no rx was sent for that either. She was taking 1 pill a week. Inform pt per chart she suppose to take 1 pill a day. [facility]

## 2013-11-30 NOTE — Telephone Encounter (Signed)
Patient ask why was a another Vit D medication sent [facility] and she doesn't need any refills, and taking as prescribed.

## 2013-11-30 NOTE — Telephone Encounter (Signed)
Please advise on cholesterol medication, not sure if I am overlooking this in Epic. Thanks

## 2013-11-30 NOTE — Telephone Encounter (Signed)
Start vytorin
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
## 2013-12-07 MED ORDER — QUETIAPINE FUMARATE 25 MG PO TABS: 25.0000 mg | ORAL_TABLET | Freq: Two times a day (BID) | ORAL | Status: DC | PRN

## 2013-12-07 NOTE — Telephone Encounter (Signed)
Pt is taking Valium as prescribed and is still having panic attacks. Pt takes it when anxiety is coming on and it is not helping. Klonopin did not help either. She is working with a Transport planner and using coping skills.  A/P: Bipolar d/o, GAD 1. Start Seroquel 25mg  po BID prn anxiety 2. Continue Seroquel 350mg  po qHS for mood lability 3. D/c Valium as pt reports it is ineffective

## 2013-12-08 ENCOUNTER — Telehealth (HOSPITAL_COMMUNITY): Payer: Self-pay

## 2013-12-08 ENCOUNTER — Telehealth (HOSPITAL_COMMUNITY): Payer: Self-pay | Admitting: *Deleted

## 2013-12-08 DIAGNOSIS — F319 Bipolar disorder, unspecified: Secondary | ICD-10-CM

## 2013-12-08 MED ORDER — QUETIAPINE FUMARATE 25 MG PO TABS: 25.0000 mg | ORAL_TABLET | Freq: Four times a day (QID) | ORAL | Status: DC

## 2013-12-08 NOTE — Telephone Encounter (Signed)
Pt called very anxious and upset about her Seroquel. She was unable to pick it up at her pharmacy because she picked up the night dose of  50mg  on 10/2 and her insurance will not pay for another prescription of the same dosage 25mg  BID prn. She would have to pay out of pocket. Called and spoke to Dr. Doyne Keel who gave order to change to Seroquel 25mg  QID and instruct patient to take 2 throughout day as needed and the other 2 at hs. Called in to pharmacy, returned call to patient. Pt understands dosage and was thankful for a solution.

## 2013-12-18 ENCOUNTER — Other Ambulatory Visit: Payer: Self-pay

## 2013-12-18 DIAGNOSIS — F419 Anxiety disorder, unspecified: Secondary | ICD-10-CM

## 2013-12-18 DIAGNOSIS — F3160 Bipolar disorder, current episode mixed, unspecified: Secondary | ICD-10-CM

## 2013-12-18 MED ORDER — GABAPENTIN 600 MG PO TABS: 600.0000 mg | ORAL_TABLET | Freq: Three times a day (TID) | ORAL | Status: DC

## 2013-12-19 ENCOUNTER — Telehealth: Payer: Self-pay | Admitting: Internal Medicine

## 2013-12-19 NOTE — Telephone Encounter (Signed)
Patient Information:  Caller Name: Danyal  Phone: [phone]  Patient: Shannon [name]  Gender: Female  DOB: [birth year]  Age: 48 Years  PCP: Scarlette Calico (Adults only)  Pregnant: No  Office Follow Up:  Does the office need to follow up with this patient?: No  Instructions For The Office: N/A   Symptoms  Reason For Call & Symptoms: Pt reports she has severe mirgraine headaches.  Pt reports she has tried to stop smoking due to headaches are worse when smoking .  Reviewed Health History In EMR: Yes  Reviewed Medications In EMR: Yes  Reviewed Allergies In EMR: Yes  Reviewed Surgeries / Procedures: Yes  Date of Onset of Symptoms: 11/28/2013 OB / GYN:  LMP: Unknown  Guideline(s) Used:  Headache  Disposition Per Guideline:   See Today in Office  Reason For Disposition Reached:   Patient wants to be seen  Advice Given:  Call Back If:  You become worse.  Patient Will Follow Care Advice:  YES  Appointment Scheduled:  12/20/2013 11:15:00 Appointment Scheduled Provider:  Scarlette Calico (Adults only)

## 2013-12-20 ENCOUNTER — Encounter: Payer: Self-pay | Admitting: Internal Medicine

## 2013-12-20 ENCOUNTER — Ambulatory Visit (INDEPENDENT_AMBULATORY_CARE_PROVIDER_SITE_OTHER): Payer: PRIVATE HEALTH INSURANCE | Admitting: Internal Medicine

## 2013-12-20 VITALS — BP 144/98 | HR 112 | Temp 98.7°F | Resp 16 | Ht 63.0 in | Wt 258.0 lb

## 2013-12-20 DIAGNOSIS — R519 Headache, unspecified: Secondary | ICD-10-CM

## 2013-12-20 DIAGNOSIS — R51 Headache: Secondary | ICD-10-CM

## 2013-12-20 DIAGNOSIS — I10 Essential (primary) hypertension: Secondary | ICD-10-CM

## 2013-12-20 HISTORY — DX: Headache, unspecified: R51.9

## 2013-12-20 MED ORDER — NEBIVOLOL HCL 10 MG PO TABS: 10.0000 mg | ORAL_TABLET | Freq: Every day | ORAL | Status: DC

## 2013-12-20 MED ORDER — SUMATRIPTAN SUCCINATE 50 MG PO TABS: 50.0000 mg | ORAL_TABLET | ORAL | Status: DC | PRN

## 2013-12-20 NOTE — Patient Instructions (Signed)

## 2013-12-21 ENCOUNTER — Telehealth: Payer: Self-pay | Admitting: Internal Medicine

## 2013-12-21 ENCOUNTER — Ambulatory Visit (INDEPENDENT_AMBULATORY_CARE_PROVIDER_SITE_OTHER)
Admission: RE | Admit: 2013-12-21 | Discharge: 2013-12-21 | Disposition: A | Discharge status: Home / Self Care | Payer: PRIVATE HEALTH INSURANCE | Source: Ambulatory Visit | Attending: Internal Medicine | Admitting: Internal Medicine

## 2013-12-21 DIAGNOSIS — R519 Headache, unspecified: Secondary | ICD-10-CM

## 2013-12-21 DIAGNOSIS — R51 Headache: Secondary | ICD-10-CM

## 2013-12-21 NOTE — Telephone Encounter (Signed)
emmi emailed °

## 2013-12-22 ENCOUNTER — Telehealth: Payer: Self-pay | Admitting: Internal Medicine

## 2013-12-22 NOTE — Telephone Encounter (Signed)
Pt called in and is requesting call from nurse on meds that Dr Ronnald Ramp gave her.  She lost paper and not sure when to take it.

## 2013-12-24 ENCOUNTER — Encounter: Payer: Self-pay | Admitting: Internal Medicine

## 2013-12-24 NOTE — Assessment & Plan Note (Signed)
Her BP is well controlled 

## 2013-12-24 NOTE — Assessment & Plan Note (Signed)
This is a new HA with abnormal neuro findings so I will scan her brain to look for mass, bleed, etc For now, will treat the pain with a triptan as it appears that is most likely a migraine

## 2013-12-24 NOTE — Progress Notes (Signed)
Subjective:    Patient ID: Shannon [name], female    DOB: February 03, 1966, 48 y.o.   MRN: [id number]  Hypertension This is a new problem. Episode onset: for 3 weeks. The problem is unchanged. The problem is uncontrolled. Associated symptoms include anxiety and headaches. Pertinent negatives include no blurred vision, chest pain, malaise/fatigue, neck pain, orthopnea, palpitations, peripheral edema, PND, shortness of breath or sweats.  Headache  This is a new problem. Episode onset: 3 weeks. The problem occurs intermittently. The problem has been unchanged. The pain is located in the bilateral and frontal region. The pain does not radiate. The pain quality is not similar to prior headaches. The quality of the pain is described as sharp and throbbing. The pain is at a severity of 4/10. The pain is moderate. Associated symptoms include dizziness, phonophobia and photophobia. Pertinent negatives include no abdominal pain, abnormal behavior, anorexia, back pain, blurred vision, coughing, drainage, ear pain, eye pain, eye redness, facial sweating, fever, hearing loss, insomnia, loss of balance, muscle aches, nausea, neck pain, numbness, rhinorrhea, scalp tenderness, seizures, sinus pressure, sore throat, swollen glands, tingling, tinnitus, visual change, vomiting, weakness or weight loss. Nothing aggravates the symptoms. She has tried nothing for the symptoms. The treatment provided no relief. Her past medical history is significant for hypertension.      Review of Systems  Constitutional: Negative.  Negative for fever, chills, weight loss, malaise/fatigue, diaphoresis, appetite change and fatigue.  HENT: Negative for ear pain, hearing loss, rhinorrhea, sinus pressure, sore throat and tinnitus.   Eyes: Positive for photophobia. Negative for blurred vision, pain and redness.  Respiratory: Negative.  Negative for cough, choking, chest tightness, shortness of breath and stridor.   Cardiovascular: Negative.   Negative for chest pain, palpitations, orthopnea, leg swelling and PND.  Gastrointestinal: Negative.  Negative for nausea, vomiting, abdominal pain and anorexia.  Endocrine: Negative.   Genitourinary: Negative.   Musculoskeletal: Negative.  Negative for myalgias, back pain, arthralgias and neck pain.  Skin: Negative.  Negative for rash.  Allergic/Immunologic: Negative.   Neurological: Positive for dizziness and headaches. Negative for tingling, tremors, seizures, syncope, facial asymmetry, speech difficulty, weakness, light-headedness, numbness and loss of balance.  Hematological: Negative.  Negative for adenopathy. Does not bruise/bleed easily.  Psychiatric/Behavioral: Negative.  The patient does not have insomnia.        Objective:   Physical Exam  Constitutional: She appears well-developed and well-nourished.  Non-toxic appearance. She does not have a sickly appearance. She does not appear ill. No distress.  HENT:  Head: Normocephalic and atraumatic.  Mouth/Throat: Oropharynx is clear and moist. No oropharyngeal exudate.  Eyes: Conjunctivae and EOM are normal. Pupils are equal, round, and reactive to light. Right eye exhibits no discharge. Left eye exhibits no discharge. No scleral icterus.  Neck: Normal range of motion. Neck supple. No JVD present. No tracheal deviation present. No thyromegaly present.  Cardiovascular: Normal rate, regular rhythm, normal heart sounds and intact distal pulses.  Exam reveals no gallop and no friction rub.   No murmur heard. Pulmonary/Chest: Effort normal and breath sounds normal. No stridor. No respiratory distress. She has no wheezes. She has no rales. She exhibits no tenderness.  Abdominal: Soft. Bowel sounds are normal. She exhibits no distension and no mass. There is no tenderness. There is no rebound and no guarding.  Musculoskeletal: Normal range of motion. She exhibits no edema or tenderness.  Lymphadenopathy:    She has no cervical adenopathy.    Neurological: She is alert. She  has normal strength. She displays no atrophy, no tremor and normal reflexes. No cranial nerve deficit or sensory deficit. She exhibits abnormal muscle tone. She displays a negative Romberg sign. She displays no seizure activity. Coordination and gait normal. She displays no Babinski's sign on the right side. She displays no Babinski's sign on the left side.  Reflex Scores:      Tricep reflexes are 1+ on the right side and 1+ on the left side.      Bicep reflexes are 1+ on the right side and 1+ on the left side.      Brachioradialis reflexes are 0 on the right side and 0 on the left side.      Patellar reflexes are 0 on the right side and 0 on the left side.      Achilles reflexes are 0 on the right side and 0 on the left side. She has mild weakness in her RUE and RLE  Skin: Skin is warm and dry. No rash noted. She is not diaphoretic. No erythema. No pallor.  Psychiatric: She has a normal mood and affect. Her behavior is normal. Judgment and thought content normal.  Vitals reviewed.     Lab Results  Component Value Date   WBC 7.1 10/25/2013   HGB 13.8 10/25/2013   HCT 41.6 10/25/2013   PLT 256.0 10/25/2013   GLUCOSE 87 10/25/2013   CHOL 309* 10/25/2013   TRIG 167.0* 10/25/2013   HDL 40.90 10/25/2013   LDLDIRECT 195.6 10/26/2012   LDLCALC 235* 10/25/2013   ALT 14 10/25/2013   AST 14 10/25/2013   NA 138 10/25/2013   K 3.9 10/25/2013   CL 105 10/25/2013   CREATININE 0.7 10/25/2013   BUN 6 10/25/2013   CO2 25 10/25/2013   TSH 1.67 10/25/2013   HGBA1C 5.6 10/25/2013      Assessment & Plan:

## 2013-12-28 ENCOUNTER — Encounter (HOSPITAL_COMMUNITY): Payer: Self-pay | Admitting: Psychiatry

## 2013-12-28 ENCOUNTER — Ambulatory Visit (INDEPENDENT_AMBULATORY_CARE_PROVIDER_SITE_OTHER): Payer: 59 | Admitting: Psychiatry

## 2013-12-28 VITALS — BP 118/79 | HR 74 | Ht 63.0 in | Wt 260.0 lb

## 2013-12-28 DIAGNOSIS — F411 Generalized anxiety disorder: Secondary | ICD-10-CM | POA: Insufficient documentation

## 2013-12-28 DIAGNOSIS — F319 Bipolar disorder, unspecified: Secondary | ICD-10-CM | POA: Insufficient documentation

## 2013-12-28 HISTORY — DX: Generalized anxiety disorder: F41.1

## 2013-12-28 MED ORDER — QUETIAPINE FUMARATE 25 MG PO TABS: 25.0000 mg | ORAL_TABLET | Freq: Four times a day (QID) | ORAL | Status: DC

## 2013-12-28 MED ORDER — QUETIAPINE FUMARATE ER 300 MG PO TB24: 300.0000 mg | ORAL_TABLET | Freq: Every day | ORAL | Status: DC
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
## 2014-01-21 ENCOUNTER — Other Ambulatory Visit: Payer: Self-pay | Admitting: Internal Medicine

## 2014-02-19 ENCOUNTER — Ambulatory Visit (INDEPENDENT_AMBULATORY_CARE_PROVIDER_SITE_OTHER): Payer: PRIVATE HEALTH INSURANCE | Admitting: Internal Medicine

## 2014-02-19 ENCOUNTER — Telehealth (HOSPITAL_COMMUNITY): Payer: Self-pay

## 2014-02-19 ENCOUNTER — Encounter: Payer: Self-pay | Admitting: Internal Medicine

## 2014-02-19 VITALS — BP 122/84 | HR 98 | Temp 98.2°F | Resp 76 | Ht 63.0 in | Wt 263.0 lb

## 2014-02-19 DIAGNOSIS — D509 Iron deficiency anemia, unspecified: Secondary | ICD-10-CM

## 2014-02-19 DIAGNOSIS — H8309 Labyrinthitis, unspecified ear: Secondary | ICD-10-CM | POA: Insufficient documentation

## 2014-02-19 DIAGNOSIS — I10 Essential (primary) hypertension: Secondary | ICD-10-CM

## 2014-02-19 DIAGNOSIS — F410 Panic disorder [episodic paroxysmal anxiety] without agoraphobia: Secondary | ICD-10-CM

## 2014-02-19 HISTORY — DX: Labyrinthitis, unspecified ear: H83.09

## 2014-02-19 MED ORDER — PREDNISONE 50 MG PO TABS: ORAL_TABLET | ORAL | Status: DC

## 2014-02-19 MED ORDER — NEBIVOLOL HCL 10 MG PO TABS: 10.0000 mg | ORAL_TABLET | Freq: Every day | ORAL | Status: DC

## 2014-02-19 NOTE — Progress Notes (Signed)
Subjective:    Patient ID: Shannon [name], female    DOB: 1966/02/11, 48 y.o.   MRN: [id number]  HPI  She has been ill for about 3 weeks with sore throat and LAD on the left side of her neck. Those symptoms have resolved but now she complains of "thumping" in her left ear with intermittent lightheadedness and dizziness.  Review of Systems  Constitutional: Negative.  Negative for fever, chills, diaphoresis, activity change, appetite change, fatigue and unexpected weight change.  HENT: Negative.  Negative for congestion, dental problem, drooling, ear discharge, ear pain, facial swelling, hearing loss, mouth sores, nosebleeds, postnasal drip, rhinorrhea, sinus pressure, sneezing, sore throat, tinnitus, trouble swallowing and voice change.   Eyes: Negative.   Respiratory: Negative.  Negative for cough, choking, chest tightness, shortness of breath and stridor.   Cardiovascular: Negative.  Negative for chest pain, palpitations and leg swelling.  Gastrointestinal: Negative.  Negative for abdominal pain.  Endocrine: Negative.   Genitourinary: Negative.   Musculoskeletal: Negative.  Negative for myalgias, arthralgias and gait problem.  Skin: Negative.  Negative for rash.  Allergic/Immunologic: Negative.   Neurological: Positive for dizziness and light-headedness. Negative for tremors, seizures, syncope, facial asymmetry, speech difficulty, weakness, numbness and headaches.  Hematological: Negative.  Negative for adenopathy. Does not bruise/bleed easily.  Psychiatric/Behavioral: Positive for sleep disturbance. Negative for suicidal ideas, hallucinations, behavioral problems, confusion, self-injury, dysphoric mood, decreased concentration and agitation. The patient is nervous/anxious. The patient is not hyperactive.        Objective:   Physical Exam  Constitutional: She is oriented to person, place, and time. She appears well-developed and well-nourished. No distress.  HENT:  Head:  Normocephalic and atraumatic.  Right Ear: Hearing, tympanic membrane, external ear and ear canal normal.  Left Ear: Hearing, tympanic membrane, external ear and ear canal normal.  Mouth/Throat: Oropharynx is clear and moist. No oral lesions. No trismus in the jaw. No uvula swelling. No oropharyngeal exudate.  Eyes: Conjunctivae are normal. Right eye exhibits no discharge. Left eye exhibits no discharge. No scleral icterus.  Neck: Normal range of motion. Neck supple. No JVD present. No tracheal deviation present. No thyromegaly present.  Cardiovascular: Normal rate, regular rhythm, normal heart sounds and intact distal pulses.  Exam reveals no gallop and no friction rub.   No murmur heard. Pulmonary/Chest: Effort normal and breath sounds normal. No stridor. No respiratory distress. She has no wheezes. She has no rales. She exhibits no tenderness.  Abdominal: Soft. Bowel sounds are normal. She exhibits no distension and no mass. There is no tenderness. There is no rebound and no guarding.  Musculoskeletal: Normal range of motion. She exhibits no edema or tenderness.  Lymphadenopathy:       Head (right side): No submental, no submandibular, no tonsillar, no preauricular, no posterior auricular and no occipital adenopathy present.       Head (left side): No submental, no submandibular, no tonsillar, no preauricular, no posterior auricular and no occipital adenopathy present.    She has no cervical adenopathy.       Right cervical: No superficial cervical, no deep cervical and no posterior cervical adenopathy present.      Left cervical: No superficial cervical, no deep cervical and no posterior cervical adenopathy present.    She has no axillary adenopathy.  Neurological: She is alert and oriented to person, place, and time. She has normal reflexes. She displays normal reflexes. No cranial nerve deficit. She exhibits normal muscle tone. Coordination normal.  Skin: Skin  is warm and dry. No rash noted.  She is not diaphoretic. No erythema. No pallor.  Psychiatric: She has a normal mood and affect. Her behavior is normal. Judgment and thought content normal.  Vitals reviewed.    Lab Results  Component Value Date   WBC 7.1 10/25/2013   HGB 13.8 10/25/2013   HCT 41.6 10/25/2013   PLT 256.0 10/25/2013   GLUCOSE 87 10/25/2013   CHOL 309* 10/25/2013   TRIG 167.0* 10/25/2013   HDL 40.90 10/25/2013   LDLDIRECT 195.6 10/26/2012   LDLCALC 235* 10/25/2013   ALT 14 10/25/2013   AST 14 10/25/2013   NA 138 10/25/2013   K 3.9 10/25/2013   CL 105 10/25/2013   CREATININE 0.7 10/25/2013   BUN 6 10/25/2013   CO2 25 10/25/2013   TSH 1.67 10/25/2013   HGBA1C 5.6 10/25/2013       Assessment & Plan:

## 2014-02-19 NOTE — Patient Instructions (Signed)
Labyrinthitis (Inner Ear Inflammation) Your exam shows you have an inner ear disturbance or labyrinthitis. The cause of this condition is not known. But it may be due to a virus infection. The symptoms of labyrinthitis include vertigo or dizziness made worse by motion, nausea and vomiting. The onset of labyrinthitis may be very sudden. It usually lasts for a few days and then clears up over 1-2 weeks. The treatment of an inner ear disturbance includes bed rest and medications to reduce dizziness, nausea, and vomiting. You should stay away from alcohol, tranquilizers, caffeine, nicotine, or any medicine your doctor thinks may make your symptoms worse. Further testing may be needed to evaluate your hearing and balance system. Please see your doctor or go to the emergency room right away if you have:  Increasing vertigo, earache, loss of hearing, or ear drainage.  Headache, blurred vision, trouble walking, fainting, or fever.  Persistent vomiting, dehydration, or extreme weakness. Document Released: 02/09/2005 Document Revised: 05/04/2011 Document Reviewed: 07/28/2006 [facility] Patient Information 2015 [place], [place]. This information is not intended to replace advice given to you by your health care provider. Make sure you discuss any questions you have with your health care provider.

## 2014-02-20 MED ORDER — CLONAZEPAM 1 MG PO TABS: 1.0000 mg | ORAL_TABLET | Freq: Three times a day (TID) | ORAL | Status: DC | PRN

## 2014-02-20 NOTE — Assessment & Plan Note (Signed)
She has viral labyrntihitis s/p a URI, the URI has resolved but she has inner ear symptoms Will try a brief course of steroids to see if this will help reduce the symptoms

## 2014-02-20 NOTE — Assessment & Plan Note (Signed)
Her BP is well controlled 

## 2014-02-20 NOTE — Telephone Encounter (Signed)
Pt states she had a severe panic attack out of the blue last Thursday. She had already taken her morning meds. It took about 74min for her panic attack to resolve. She used breathing techniques and relaxed. 2 hrs later she had another severe panic attack. Pt called 911. Paramedics suggested she call her psychiatrist to get Klonopin restarted. States she lives alone and it was traumatic. She is upset. States therapy is not the answer and is not helpful with panic attacks. Pt would like to stop Seroquel and restart Klonopin.   A/P: Bipolar disorder, GAD, Panic disorder 1. D/c Seroquel 25mg  BID prn anxiety 2. Restart trial Klonopin 1mg  po TID prn anxiety 3. Discussed the need to start an SSRI or SNRI at the next appt to address panic attacks.

## 2014-02-26 ENCOUNTER — Telehealth: Payer: Self-pay | Admitting: Internal Medicine

## 2014-02-26 DIAGNOSIS — H8302 Labyrinthitis, left ear: Secondary | ICD-10-CM

## 2014-02-26 NOTE — Telephone Encounter (Signed)
Pt called stated that her dizziness and thumping in ear coming back last night. Pt was wondering what should she do at this point, offer an appt with Dr. Ronnald Ramp today but pt want [name] to send massage to Dr. Ronnald Ramp first. Please advise.

## 2014-02-27 ENCOUNTER — Ambulatory Visit (INDEPENDENT_AMBULATORY_CARE_PROVIDER_SITE_OTHER): Payer: 59 | Admitting: Psychiatry

## 2014-02-27 ENCOUNTER — Encounter (HOSPITAL_COMMUNITY): Payer: Self-pay | Admitting: Psychiatry

## 2014-02-27 DIAGNOSIS — F410 Panic disorder [episodic paroxysmal anxiety] without agoraphobia: Secondary | ICD-10-CM

## 2014-02-27 DIAGNOSIS — F411 Generalized anxiety disorder: Secondary | ICD-10-CM

## 2014-02-27 DIAGNOSIS — F4310 Post-traumatic stress disorder, unspecified: Secondary | ICD-10-CM

## 2014-02-27 DIAGNOSIS — F319 Bipolar disorder, unspecified: Secondary | ICD-10-CM

## 2014-02-27 MED ORDER — QUETIAPINE FUMARATE ER 300 MG PO TB24: 300.0000 mg | ORAL_TABLET | Freq: Every day | ORAL | Status: DC
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
## 2014-02-28 NOTE — Telephone Encounter (Signed)
Notified patient of urgent ENT referral

## 2014-02-28 NOTE — Telephone Encounter (Signed)
I have sent an urgent request for an ENT referral

## 2014-02-28 NOTE — Telephone Encounter (Signed)
Pt called back in requesting a return call today.  Pt upset that she has not heard anything back.

## 2014-03-02 DIAGNOSIS — G44201 Tension-type headache, unspecified, intractable: Secondary | ICD-10-CM | POA: Diagnosis not present

## 2014-03-02 DIAGNOSIS — H9202 Otalgia, left ear: Secondary | ICD-10-CM | POA: Diagnosis not present

## 2014-03-02 DIAGNOSIS — H9312 Tinnitus, left ear: Secondary | ICD-10-CM | POA: Diagnosis not present

## 2014-04-09 ENCOUNTER — Ambulatory Visit (HOSPITAL_COMMUNITY): Payer: Self-pay | Admitting: Psychiatry

## 2014-04-19 ENCOUNTER — Ambulatory Visit (INDEPENDENT_AMBULATORY_CARE_PROVIDER_SITE_OTHER): Payer: 59 | Admitting: Psychiatry

## 2014-04-19 ENCOUNTER — Encounter (HOSPITAL_COMMUNITY): Payer: Self-pay | Admitting: Psychiatry

## 2014-04-19 VITALS — BP 145/80 | HR 107 | Ht 63.0 in | Wt 270.6 lb

## 2014-04-19 DIAGNOSIS — F3131 Bipolar disorder, current episode depressed, mild: Secondary | ICD-10-CM

## 2014-04-19 DIAGNOSIS — F410 Panic disorder [episodic paroxysmal anxiety] without agoraphobia: Secondary | ICD-10-CM | POA: Diagnosis not present

## 2014-04-19 MED ORDER — LAMOTRIGINE 25 MG PO TABS: ORAL_TABLET | ORAL | Status: DC

## 2014-04-19 MED ORDER — CLONAZEPAM 1 MG PO TABS: 1.0000 mg | ORAL_TABLET | ORAL | Status: DC | PRN
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
## 2014-05-10 ENCOUNTER — Ambulatory Visit (INDEPENDENT_AMBULATORY_CARE_PROVIDER_SITE_OTHER): Payer: 59 | Admitting: Psychiatry

## 2014-05-10 ENCOUNTER — Encounter (HOSPITAL_COMMUNITY): Payer: Self-pay | Admitting: Psychiatry

## 2014-05-10 VITALS — BP 136/76 | HR 84 | Ht 63.0 in | Wt 266.4 lb

## 2014-05-10 DIAGNOSIS — F319 Bipolar disorder, unspecified: Secondary | ICD-10-CM | POA: Diagnosis not present

## 2014-05-10 DIAGNOSIS — F410 Panic disorder [episodic paroxysmal anxiety] without agoraphobia: Secondary | ICD-10-CM | POA: Diagnosis not present

## 2014-05-10 MED ORDER — CARBAMAZEPINE ER 100 MG PO TB12: 100.0000 mg | ORAL_TABLET | Freq: Two times a day (BID) | ORAL | Status: DC

## 2014-05-10 MED ORDER — HYDROXYZINE HCL 50 MG PO TABS: 50.0000 mg | ORAL_TABLET | Freq: Every day | ORAL | Status: DC

## 2014-05-10 MED ORDER — QUETIAPINE FUMARATE ER 300 MG PO TB24: 300.0000 mg | ORAL_TABLET | Freq: Every day | ORAL | Status: DC
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
## 2014-05-30 ENCOUNTER — Ambulatory Visit (INDEPENDENT_AMBULATORY_CARE_PROVIDER_SITE_OTHER)
Admission: RE | Admit: 2014-05-30 | Discharge: 2014-05-30 | Disposition: A | Discharge status: Home / Self Care | Payer: Medicare Other | Source: Ambulatory Visit | Attending: Internal Medicine | Admitting: Internal Medicine

## 2014-05-30 ENCOUNTER — Ambulatory Visit (INDEPENDENT_AMBULATORY_CARE_PROVIDER_SITE_OTHER): Payer: Medicare Other | Admitting: Internal Medicine

## 2014-05-30 ENCOUNTER — Encounter: Payer: Self-pay | Admitting: Internal Medicine

## 2014-05-30 VITALS — BP 130/80 | HR 94 | Temp 98.7°F | Resp 16 | Wt 268.0 lb

## 2014-05-30 DIAGNOSIS — R059 Cough, unspecified: Secondary | ICD-10-CM

## 2014-05-30 DIAGNOSIS — A084 Viral intestinal infection, unspecified: Secondary | ICD-10-CM

## 2014-05-30 DIAGNOSIS — R05 Cough: Secondary | ICD-10-CM | POA: Diagnosis not present

## 2014-05-30 MED ORDER — PROMETHAZINE HCL 12.5 MG PO TABS: 12.5000 mg | ORAL_TABLET | Freq: Four times a day (QID) | ORAL | Status: DC | PRN

## 2014-05-30 NOTE — Progress Notes (Signed)
Subjective:    Patient ID: Shannon [name], female    DOB: 10/28/1965, 49 y.o.   MRN: [id number]  HPI Comments: She complains of a 4 day history that started with a sore throat then she developed N/V/diarrhea (2 loose BM's per day), and cough productive of white phlegm.  Cough This is a new problem. The current episode started in the past 7 days. The problem has been unchanged. The cough is productive of sputum. Pertinent negatives include no chest pain, chills, ear congestion, ear pain, fever, headaches, heartburn, hemoptysis, myalgias, nasal congestion, postnasal drip, rash, rhinorrhea, sore throat, shortness of breath, sweats, weight loss or wheezing. She has tried nothing for the symptoms. The treatment provided no relief.      Review of Systems  Constitutional: Negative.  Negative for fever, chills, weight loss, diaphoresis and fatigue.  HENT: Negative.  Negative for ear pain, postnasal drip, rhinorrhea, sore throat and trouble swallowing.   Eyes: Negative.   Respiratory: Positive for cough. Negative for apnea, hemoptysis, choking, chest tightness, shortness of breath and wheezing.   Cardiovascular: Negative.  Negative for chest pain, palpitations and leg swelling.  Gastrointestinal: Positive for nausea, vomiting and diarrhea. Negative for heartburn, abdominal pain and rectal pain.  Endocrine: Negative.   Genitourinary: Negative.   Musculoskeletal: Negative.  Negative for myalgias.  Skin: Negative.  Negative for rash.  Allergic/Immunologic: Negative.   Neurological: Negative.  Negative for dizziness, weakness, light-headedness and headaches.  Hematological: Negative.  Negative for adenopathy. Does not bruise/bleed easily.  Psychiatric/Behavioral: Negative.        Objective:   Physical Exam  Constitutional: She is oriented to person, place, and time. She appears well-developed and well-nourished.  Non-toxic appearance. She does not have a sickly appearance. She does not appear  ill. No distress.  HENT:  Head: Normocephalic and atraumatic.  Mouth/Throat: Oropharynx is clear and moist. No oropharyngeal exudate.  Eyes: Conjunctivae are normal. Right eye exhibits no discharge. Left eye exhibits no discharge. No scleral icterus.  Neck: Normal range of motion. Neck supple. No JVD present. No tracheal deviation present. No thyromegaly present.  Cardiovascular: Normal rate, regular rhythm, normal heart sounds and intact distal pulses.  Exam reveals no gallop and no friction rub.   No murmur heard. Pulmonary/Chest: Effort normal and breath sounds normal. No stridor. No respiratory distress. She has no wheezes. She has no rales. She exhibits no tenderness.  Abdominal: Soft. Bowel sounds are normal. She exhibits no distension and no mass. There is no tenderness. There is no rebound and no guarding.  Musculoskeletal: Normal range of motion. She exhibits no edema or tenderness.  Lymphadenopathy:    She has no cervical adenopathy.  Neurological: She is oriented to person, place, and time.  Skin: Skin is warm and dry. No rash noted. She is not diaphoretic. No erythema. No pallor.  Psychiatric: She has a normal mood and affect. Her behavior is normal. Judgment and thought content normal.  Vitals reviewed.    Lab Results  Component Value Date   WBC 7.1 10/25/2013   HGB 13.8 10/25/2013   HCT 41.6 10/25/2013   PLT 256.0 10/25/2013   GLUCOSE 87 10/25/2013   CHOL 309* 10/25/2013   TRIG 167.0* 10/25/2013   HDL 40.90 10/25/2013   LDLDIRECT 195.6 10/26/2012   LDLCALC 235* 10/25/2013   ALT 14 10/25/2013   AST 14 10/25/2013   NA 138 10/25/2013   K 3.9 10/25/2013   CL 105 10/25/2013   CREATININE 0.7 10/25/2013   BUN  6 10/25/2013   CO2 25 10/25/2013   TSH 1.67 10/25/2013   HGBA1C 5.6 10/25/2013       Assessment & Plan:

## 2014-05-30 NOTE — Progress Notes (Signed)
Pre visit review using our clinic review tool, if applicable. No additional management support is needed unless otherwise documented below in the visit note. 

## 2014-05-30 NOTE — Patient Instructions (Signed)

## 2014-05-31 ENCOUNTER — Telehealth: Payer: Self-pay

## 2014-05-31 NOTE — Assessment & Plan Note (Signed)
She will take phenergan for symptom relief She will increase her intake of liquids She will let me know if she develops and new or worsening s/s

## 2014-05-31 NOTE — Assessment & Plan Note (Signed)
Her CXR is normal This is a Viral URI She will try a cough suppressant

## 2014-05-31 NOTE — Telephone Encounter (Signed)
-----   Message from Shannon Lima, MD sent at 05/30/2014 12:08 PM EDT ----- Please tell her that her CXR was ok, no pneumonia

## 2014-05-31 NOTE — Telephone Encounter (Signed)
Advised pt chest xray was normal, no pneumonia

## 2014-06-07 ENCOUNTER — Ambulatory Visit (INDEPENDENT_AMBULATORY_CARE_PROVIDER_SITE_OTHER): Payer: 59 | Admitting: Psychiatry

## 2014-06-07 ENCOUNTER — Telehealth: Payer: Self-pay | Admitting: Internal Medicine

## 2014-06-07 ENCOUNTER — Encounter (HOSPITAL_COMMUNITY): Payer: Self-pay | Admitting: Psychiatry

## 2014-06-07 VITALS — BP 149/90 | HR 68 | Ht 63.0 in | Wt 267.0 lb

## 2014-06-07 DIAGNOSIS — Z79899 Other long term (current) drug therapy: Secondary | ICD-10-CM

## 2014-06-07 DIAGNOSIS — F3131 Bipolar disorder, current episode depressed, mild: Secondary | ICD-10-CM

## 2014-06-07 DIAGNOSIS — F410 Panic disorder [episodic paroxysmal anxiety] without agoraphobia: Secondary | ICD-10-CM | POA: Diagnosis not present

## 2014-06-07 DIAGNOSIS — F319 Bipolar disorder, unspecified: Secondary | ICD-10-CM | POA: Diagnosis not present

## 2014-06-07 MED ORDER — QUETIAPINE FUMARATE 300 MG PO TABS: 300.0000 mg | ORAL_TABLET | Freq: Every day | ORAL | Status: DC

## 2014-06-07 MED ORDER — CLONAZEPAM 1 MG PO TABS: 1.0000 mg | ORAL_TABLET | ORAL | Status: DC | PRN

## 2014-06-07 MED ORDER — HYDROXYZINE HCL 50 MG PO TABS: 50.0000 mg | ORAL_TABLET | Freq: Every day | ORAL | Status: DC

## 2014-06-07 MED ORDER — CARBAMAZEPINE ER 100 MG PO TB12: 100.0000 mg | ORAL_TABLET | Freq: Two times a day (BID) | ORAL | Status: DC

## 2014-06-07 NOTE — Telephone Encounter (Signed)
Per Epic, lab orders (a1c, carbamazepine level) already entered on 06/07/14 and pt has a follow up already scheduled with PCP 06/11/14
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
## 2014-06-07 NOTE — Telephone Encounter (Signed)
Dr. Tomie China need her to get some blood work for her psychiatric medicine she takes. She was told that Dr. Ronnald Ramp would need to order

## 2014-06-11 ENCOUNTER — Encounter: Payer: Self-pay | Admitting: Internal Medicine

## 2014-06-11 ENCOUNTER — Ambulatory Visit (INDEPENDENT_AMBULATORY_CARE_PROVIDER_SITE_OTHER): Payer: Medicare Other | Admitting: Internal Medicine

## 2014-06-11 ENCOUNTER — Ambulatory Visit: Payer: Self-pay | Admitting: Internal Medicine

## 2014-06-11 ENCOUNTER — Other Ambulatory Visit (INDEPENDENT_AMBULATORY_CARE_PROVIDER_SITE_OTHER): Payer: Medicare Other

## 2014-06-11 VITALS — BP 134/86 | HR 77 | Temp 98.3°F | Resp 16 | Wt 265.0 lb

## 2014-06-11 DIAGNOSIS — I10 Essential (primary) hypertension: Secondary | ICD-10-CM

## 2014-06-11 DIAGNOSIS — Z79899 Other long term (current) drug therapy: Secondary | ICD-10-CM

## 2014-06-11 DIAGNOSIS — F319 Bipolar disorder, unspecified: Secondary | ICD-10-CM | POA: Diagnosis not present

## 2014-06-11 DIAGNOSIS — Z9884 Bariatric surgery status: Secondary | ICD-10-CM

## 2014-06-11 DIAGNOSIS — J301 Allergic rhinitis due to pollen: Secondary | ICD-10-CM | POA: Diagnosis not present

## 2014-06-11 DIAGNOSIS — F3160 Bipolar disorder, current episode mixed, unspecified: Secondary | ICD-10-CM

## 2014-06-11 LAB — HEMOGLOBIN A1C: Hgb A1c MFr Bld: 5.6 % (ref 4.6–6.5)

## 2014-06-11 LAB — TSH: TSH: 2.2 u[IU]/mL (ref 0.35–4.50)

## 2014-06-11 MED ORDER — LEVOCETIRIZINE DIHYDROCHLORIDE 5 MG PO TABS: 5.0000 mg | ORAL_TABLET | Freq: Every evening | ORAL | Status: DC

## 2014-06-11 MED ORDER — FLUTICASONE PROPIONATE 50 MCG/ACT NA SUSP: 2.0000 | Freq: Every day | NASAL | Status: DC

## 2014-06-11 NOTE — Assessment & Plan Note (Signed)
Her BP is well controlled 

## 2014-06-11 NOTE — Progress Notes (Signed)
   Subjective:    Patient ID: Shannon [name], female    DOB: 12-Jul-1965, 49 y.o.   MRN: [id number]  HPI Comments: She complains of persistent nasal allergies with nasal congestion, sneezing, left ear "popping", and itchy throat.     Review of Systems  Constitutional: Negative.  Negative for fever, chills, diaphoresis, appetite change and fatigue.  HENT: Positive for congestion, postnasal drip, rhinorrhea and sneezing. Negative for facial swelling, hearing loss, sinus pressure, sore throat, tinnitus, trouble swallowing and voice change.   Eyes: Negative.   Respiratory: Negative.  Negative for cough, choking, chest tightness, shortness of breath and stridor.   Cardiovascular: Negative.  Negative for chest pain, palpitations and leg swelling.  Gastrointestinal: Negative.  Negative for abdominal pain.  Endocrine: Negative.   Genitourinary: Negative.   Musculoskeletal: Negative.   Skin: Negative.   Allergic/Immunologic: Negative.   Neurological: Negative.   Hematological: Negative.  Negative for adenopathy. Does not bruise/bleed easily.  Psychiatric/Behavioral: Negative.        Objective:   Physical Exam  Constitutional: She is oriented to person, place, and time. She appears well-developed and well-nourished. No distress.  HENT:  Head: Normocephalic and atraumatic.  Right Ear: Hearing, tympanic membrane, external ear and ear canal normal.  Left Ear: Hearing, tympanic membrane, external ear and ear canal normal.  Nose: Mucosal edema present. No rhinorrhea, sinus tenderness or nasal deformity. Right sinus exhibits no maxillary sinus tenderness and no frontal sinus tenderness. Left sinus exhibits no maxillary sinus tenderness and no frontal sinus tenderness.  Mouth/Throat: Oropharynx is clear and moist and mucous membranes are normal. Mucous membranes are not pale, not dry and not cyanotic. No oral lesions. No trismus in the jaw. No uvula swelling. No oropharyngeal exudate, posterior  oropharyngeal edema, posterior oropharyngeal erythema or tonsillar abscesses.  Eyes: Conjunctivae are normal. Right eye exhibits no discharge. Left eye exhibits no discharge. No scleral icterus.  Neck: Normal range of motion. Neck supple. No JVD present. No tracheal deviation present. No thyromegaly present.  Cardiovascular: Normal rate, regular rhythm, normal heart sounds and intact distal pulses.  Exam reveals no gallop and no friction rub.   No murmur heard. Pulmonary/Chest: Effort normal and breath sounds normal. No stridor. No respiratory distress. She has no wheezes. She has no rales. She exhibits no tenderness.  Abdominal: Soft. Bowel sounds are normal. She exhibits no distension and no mass. There is no tenderness. There is no rebound and no guarding.  Musculoskeletal: Normal range of motion. She exhibits no edema or tenderness.  Lymphadenopathy:    She has no cervical adenopathy.  Neurological: She is oriented to person, place, and time.  Skin: Skin is warm and dry. No rash noted. She is not diaphoretic. No erythema. No pallor.  Vitals reviewed.         Assessment & Plan:

## 2014-06-11 NOTE — Progress Notes (Signed)
Pre visit review using our clinic review tool, if applicable. No additional management support is needed unless otherwise documented below in the visit note. 

## 2014-06-11 NOTE — Patient Instructions (Signed)

## 2014-06-11 NOTE — Assessment & Plan Note (Signed)
Her psych wants me to check her tegretol level

## 2014-06-11 NOTE — Assessment & Plan Note (Signed)
Will start xyzal and flonase to treat this

## 2014-06-12 LAB — CARBAMAZEPINE LEVEL, TOTAL: Carbamazepine Lvl: 3.9 ug/mL — ABNORMAL LOW (ref 4.0–12.0)

## 2014-06-18 ENCOUNTER — Other Ambulatory Visit (HOSPITAL_COMMUNITY): Payer: Self-pay | Admitting: Psychiatry

## 2014-06-27 ENCOUNTER — Telehealth (HOSPITAL_COMMUNITY): Payer: Self-pay

## 2014-06-27 NOTE — Telephone Encounter (Signed)
Medication problem - Telephone call back with patient after she left a message on the nurse's line stating she had stopped most of her medication because she was having problems with muscle twitching. Patient reported she had stopped her Quetiapine, hydroxyzine and Tegretol but was still taking her Seroquel and Gabapentin.  When this nurse questioned more, patient was apparently taking 300mg  of Quetiapine and 300 mg of Seroquel from different bottles each night and was having muscle twitching occuring in her lower extreme ties.   Patient stated she was "not sleeping at all" and reported she was also having problems with menopause she had started with night sweats and along with her behavioral health difficulties was tired of all the problems she was experiencing with medications.  Patient stated she has an appointment with her PCP on 06/28/14 and Dr. Adele Schilder on 07/05/14 but at this point is not sure which medications to continue and just wants to feel better and sleep without so many side effects to her medicine.  Patient requests instruction of which medications to return to taking or to continue as this nurse informed muscle twitching could be more likely due to seroquel over use or hormonal in nature.  Agreed to send information to Dr. Adele Schilder to request he call back to discuss.

## 2014-06-27 NOTE — Telephone Encounter (Signed)
I returned patient's phone call.  She mentioned that she has itching, rash and feeling very nervous.  When I ask if she stopped taking Lamictal patient replied that she is taking Lamictal because pharmacy gave her the prescription.  She was told not to take the Lamictal because of the rash in the past.  Patient told that she was so nervous that she stopped the quetiapine, Vistaril, Tegretol and Lamictal because she did not know which one..  I strongly encouraged to stop the Lamictal and should not take an again because she has allergies .  She wants to go back on Seroquel XR 300 and gabapentin.  She has Klonopin I recommended to go back on Klonopin to take as needed for severe anxiety.  Reassurance given.  Recommended that once she stopped the Lamictal her rash and itching should go away.  However if rash is still persists then she should see the medical doctor.

## 2014-06-28 DIAGNOSIS — R8290 Unspecified abnormal findings in urine: Secondary | ICD-10-CM | POA: Diagnosis not present

## 2014-06-28 DIAGNOSIS — Z01419 Encounter for gynecological examination (general) (routine) without abnormal findings: Secondary | ICD-10-CM | POA: Diagnosis not present

## 2014-06-28 DIAGNOSIS — N951 Menopausal and female climacteric states: Secondary | ICD-10-CM | POA: Diagnosis not present

## 2014-06-28 DIAGNOSIS — K640 First degree hemorrhoids: Secondary | ICD-10-CM | POA: Diagnosis not present

## 2014-06-28 DIAGNOSIS — Z1231 Encounter for screening mammogram for malignant neoplasm of breast: Secondary | ICD-10-CM | POA: Diagnosis not present

## 2014-06-28 DIAGNOSIS — Z130 Encounter for screening for diseases of the blood and blood-forming organs and certain disorders involving the immune mechanism: Secondary | ICD-10-CM | POA: Diagnosis not present

## 2014-06-28 DIAGNOSIS — R3915 Urgency of urination: Secondary | ICD-10-CM | POA: Diagnosis not present

## 2014-06-28 DIAGNOSIS — Z6841 Body Mass Index (BMI) 40.0 and over, adult: Secondary | ICD-10-CM | POA: Diagnosis not present

## 2014-06-28 DIAGNOSIS — Z124 Encounter for screening for malignant neoplasm of cervix: Secondary | ICD-10-CM | POA: Diagnosis not present

## 2014-06-28 DIAGNOSIS — Z1151 Encounter for screening for human papillomavirus (HPV): Secondary | ICD-10-CM | POA: Diagnosis not present

## 2014-07-01 ENCOUNTER — Other Ambulatory Visit (HOSPITAL_COMMUNITY): Payer: Self-pay | Admitting: Psychiatry

## 2014-07-02 NOTE — Telephone Encounter (Signed)
Refill requests for Hydroxyzine and Seroquel declined at this time as last given 06/07/14 and patient returns to see Dr. Adele Schilder on 07/05/14.

## 2014-07-05 ENCOUNTER — Encounter (HOSPITAL_COMMUNITY): Payer: Self-pay | Admitting: Psychiatry

## 2014-07-05 ENCOUNTER — Ambulatory Visit (INDEPENDENT_AMBULATORY_CARE_PROVIDER_SITE_OTHER): Payer: 59 | Admitting: Psychiatry

## 2014-07-05 VITALS — BP 145/87 | HR 83 | Ht 63.0 in | Wt 263.2 lb

## 2014-07-05 DIAGNOSIS — F410 Panic disorder [episodic paroxysmal anxiety] without agoraphobia: Secondary | ICD-10-CM

## 2014-07-05 DIAGNOSIS — F319 Bipolar disorder, unspecified: Secondary | ICD-10-CM | POA: Diagnosis not present

## 2014-07-05 MED ORDER — CLONAZEPAM 1 MG PO TABS: ORAL_TABLET | ORAL | Status: DC

## 2014-07-05 MED ORDER — SEROQUEL XR 300 MG PO TB24: 300.0000 mg | ORAL_TABLET | Freq: Every day | ORAL | Status: DC

## 2014-07-05 MED ORDER — CARBAMAZEPINE ER 100 MG PO TB12: 100.0000 mg | ORAL_TABLET | Freq: Two times a day (BID) | ORAL | Status: DC
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
## 2014-07-11 DIAGNOSIS — Z124 Encounter for screening for malignant neoplasm of cervix: Secondary | ICD-10-CM | POA: Diagnosis not present

## 2014-07-11 DIAGNOSIS — R87615 Unsatisfactory cytologic smear of cervix: Secondary | ICD-10-CM | POA: Diagnosis not present

## 2014-07-24 ENCOUNTER — Telehealth: Payer: Self-pay | Admitting: Internal Medicine

## 2014-07-24 DIAGNOSIS — K279 Peptic ulcer, site unspecified, unspecified as acute or chronic, without hemorrhage or perforation: Secondary | ICD-10-CM

## 2014-07-24 MED ORDER — ESOMEPRAZOLE MAGNESIUM 40 MG PO CPDR: 40.0000 mg | DELAYED_RELEASE_CAPSULE | Freq: Every day | ORAL | Status: DC

## 2014-07-24 MED ORDER — NEXIUM 40 MG PO CPDR: 40.0000 mg | DELAYED_RELEASE_CAPSULE | Freq: Every day | ORAL | Status: DC

## 2014-07-24 NOTE — Telephone Encounter (Signed)
Kyle from [facility] called regarding esomeprazole (NEXIUM) 40 MG capsule [[id number]. Patient wants name brand. Can you please resend prescription with a [id number] so the insurance will pay for it.

## 2014-07-24 NOTE — Telephone Encounter (Signed)
Pt called in and said that only the name brand (NEXIUM) 40 MG capsule  Help no generic, Can this be called in?     [facility] [address]

## 2014-07-24 NOTE — Telephone Encounter (Signed)
RX resent as Brand name only per pt request.

## 2014-07-28 ENCOUNTER — Other Ambulatory Visit (HOSPITAL_COMMUNITY): Payer: Self-pay | Admitting: Psychiatry

## 2014-07-28 DIAGNOSIS — F319 Bipolar disorder, unspecified: Secondary | ICD-10-CM

## 2014-07-31 NOTE — Telephone Encounter (Signed)
Met with Dr. Adele Schilder about received refill request for patient's prescribed Klonopin.  Called [facility]'s [facility] and Bryson Ha, pharmacist reported just filling patient's prescription from 07/05/14 on 07/30/14 so order not needed at this time.

## 2014-08-09 ENCOUNTER — Encounter: Payer: Self-pay | Admitting: Internal Medicine

## 2014-08-09 ENCOUNTER — Ambulatory Visit (INDEPENDENT_AMBULATORY_CARE_PROVIDER_SITE_OTHER): Payer: Medicare Other | Admitting: Internal Medicine

## 2014-08-09 VITALS — BP 126/76 | HR 92 | Temp 98.2°F | Resp 18 | Wt 262.0 lb

## 2014-08-09 DIAGNOSIS — M545 Low back pain, unspecified: Secondary | ICD-10-CM

## 2014-08-09 MED ORDER — MELOXICAM 7.5 MG PO TABS: ORAL_TABLET | ORAL | Status: DC

## 2014-08-09 MED ORDER — PREDNISONE 20 MG PO TABS: 20.0000 mg | ORAL_TABLET | Freq: Two times a day (BID) | ORAL | Status: DC

## 2014-08-09 NOTE — Progress Notes (Signed)
   Subjective:    Patient ID: Shannon [name], female    DOB: March 22, 1965, 49 y.o.   MRN: [id number]  HPI  Her symptoms began 4 days ago when she bent over to pick up a child. She experienced sharp pain in the lumbosacral area. The pain was treated with topical BenGay, oxycodone every 6 hours, and back brace at night. She also used salt baths. Oxycodone decreased the pain "to an 8". The pain radiated to the left buttock & into the posterior thigh. She has been "angling" her gait to decrease the pain.  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 case. She has been on gabapentin 600 mg for her bipolar disease. This prescribed 3 times a day but typically she will miss 1-2 doses.  She has no other neuromuscular or constitutional symptoms.  She had shoulder arthroscopy surgery in 2015.LS films in 2010 following a fall were negative.  Review of Systems Fever, chills, sweats, or unexplained weight loss not present. No significant headaches. Mental status change or memory loss denied. Blurred vision , diplopia or vision loss absent. Vertigo, near syncope or imbalance denied. There is no numbness, tingling, or weakness in extremities.   No loss of control of bladder or bowels. No seizure stigmata.    Objective:   Physical Exam  Pertinent or positive findings include: She walks very slowly with broad,deliberate gait. She has difficulty getting on the exam table because of the pain. She lies flat and sits up with difficulty. With straight leg raising she has pain in the lumbosacral spine but not down the legs. Deep tendon reflexes are 0+ at the knees.Extensive Henna tatooes over hands.  General appearance :adequately nourished; in no distress. BMI:46.42 Eyes: No conjunctival inflammation or scleral icterus is present.  Heart:  Normal rate and regular rhythm. S1 and S2 normal without  gallop, murmur, click, rub or other extra sounds    Lungs:Chest clear to auscultation; no wheezes, rhonchi,rales ,or rubs present.No increased work of breathing.   Abdomen: bowel sounds normal, soft and non-tender without masses, organomegaly or hernias noted.  No guarding or rebound. No flank tenderness to percussion.  Vascular : all pulses equal ; no bruits present.  Skin:Warm & dry.  Intact without suspicious lesions or rashes ; no tenting  Lymphatic: No lymphadenopathy is noted about the head, neck, axilla, or inguinal areas.   Neuro: Strength, tone & DTRs normal.         Assessment & Plan:  #1 acute low back pain with sacral radiculopathy component  Plan: Increase gabapentin to 3 times a day. Short course of prednisone.

## 2014-08-09 NOTE — Progress Notes (Signed)
Pre visit review using our clinic review tool, if applicable. No additional management support is needed unless otherwise documented below in the visit note. 

## 2014-08-09 NOTE — Patient Instructions (Signed)
Take the gabapentin one every 8 hours as needed The Orthopedic referral will be scheduled if you are no better by Monday.

## 2014-08-28 ENCOUNTER — Other Ambulatory Visit: Payer: Self-pay | Admitting: Internal Medicine

## 2014-09-06 DIAGNOSIS — Z4651 Encounter for fitting and adjustment of gastric lap band: Secondary | ICD-10-CM | POA: Diagnosis not present

## 2014-09-10 ENCOUNTER — Ambulatory Visit (INDEPENDENT_AMBULATORY_CARE_PROVIDER_SITE_OTHER): Payer: 59 | Admitting: Psychiatry

## 2014-09-10 ENCOUNTER — Encounter (HOSPITAL_COMMUNITY): Payer: Self-pay | Admitting: Psychiatry

## 2014-09-10 VITALS — BP 124/80 | HR 97 | Ht 63.0 in | Wt 265.0 lb

## 2014-09-10 DIAGNOSIS — F410 Panic disorder [episodic paroxysmal anxiety] without agoraphobia: Secondary | ICD-10-CM

## 2014-09-10 DIAGNOSIS — Z79899 Other long term (current) drug therapy: Secondary | ICD-10-CM

## 2014-09-10 DIAGNOSIS — F319 Bipolar disorder, unspecified: Secondary | ICD-10-CM

## 2014-09-10 DIAGNOSIS — F29 Unspecified psychosis not due to a substance or known physiological condition: Secondary | ICD-10-CM

## 2014-09-10 MED ORDER — SEROQUEL XR 400 MG PO TB24: 400.0000 mg | ORAL_TABLET | Freq: Every day | ORAL | Status: DC

## 2014-09-10 MED ORDER — CARBAMAZEPINE ER 100 MG PO TB12: 100.0000 mg | ORAL_TABLET | Freq: Two times a day (BID) | ORAL | Status: DC

## 2014-09-10 MED ORDER — CLONAZEPAM 1 MG PO TABS: ORAL_TABLET | ORAL | Status: DC
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
## 2014-09-26 ENCOUNTER — Encounter: Payer: Self-pay | Admitting: Internal Medicine

## 2014-09-26 ENCOUNTER — Ambulatory Visit (INDEPENDENT_AMBULATORY_CARE_PROVIDER_SITE_OTHER): Payer: Medicare Other | Admitting: Internal Medicine

## 2014-09-26 ENCOUNTER — Other Ambulatory Visit (INDEPENDENT_AMBULATORY_CARE_PROVIDER_SITE_OTHER): Payer: Medicare Other

## 2014-09-26 VITALS — BP 140/90 | HR 86 | Temp 98.6°F | Resp 12 | Ht 63.0 in | Wt 261.0 lb

## 2014-09-26 DIAGNOSIS — I10 Essential (primary) hypertension: Secondary | ICD-10-CM

## 2014-09-26 DIAGNOSIS — E785 Hyperlipidemia, unspecified: Secondary | ICD-10-CM

## 2014-09-26 DIAGNOSIS — D509 Iron deficiency anemia, unspecified: Secondary | ICD-10-CM

## 2014-09-26 DIAGNOSIS — R05 Cough: Secondary | ICD-10-CM | POA: Diagnosis not present

## 2014-09-26 DIAGNOSIS — E519 Thiamine deficiency, unspecified: Secondary | ICD-10-CM

## 2014-09-26 DIAGNOSIS — Z9884 Bariatric surgery status: Secondary | ICD-10-CM

## 2014-09-26 DIAGNOSIS — R059 Cough, unspecified: Secondary | ICD-10-CM | POA: Insufficient documentation

## 2014-09-26 LAB — LIPID PANEL
Cholesterol: 184 mg/dL (ref 0–200)
HDL: 36.9 mg/dL — ABNORMAL LOW (ref >39.00)
LDL Cholesterol: 124 mg/dL — ABNORMAL HIGH (ref 0–99)
NonHDL: 146.95
Total CHOL/HDL Ratio: 5
Triglycerides: 117 mg/dL (ref 0.0–149.0)
VLDL: 23.4 mg/dL (ref 0.0–40.0)

## 2014-09-26 LAB — CBC WITH DIFFERENTIAL/PLATELET
Basophils Absolute: 0 10*3/uL (ref 0.0–0.1)
Basophils Relative: 0.6 % (ref 0.0–3.0)
Eosinophils Absolute: 0.1 10*3/uL (ref 0.0–0.7)
Eosinophils Relative: 1.5 % (ref 0.0–5.0)
HCT: 38.8 % (ref 36.0–46.0)
Hemoglobin: 13.3 g/dL (ref 12.0–15.0)
Lymphocytes Relative: 25.4 % (ref 12.0–46.0)
Lymphs Abs: 1.9 10*3/uL (ref 0.7–4.0)
MCHC: 34.1 g/dL (ref 30.0–36.0)
MCV: 86.1 fl (ref 78.0–100.0)
Monocytes Absolute: 0.3 10*3/uL (ref 0.1–1.0)
Monocytes Relative: 4.2 % (ref 3.0–12.0)
Neutro Abs: 5.2 10*3/uL (ref 1.4–7.7)
Neutrophils Relative %: 68.3 % (ref 43.0–77.0)
Platelets: 286 10*3/uL (ref 150.0–400.0)
RBC: 4.51 Mil/uL (ref 3.87–5.11)
RDW: 14.7 % (ref 11.5–15.5)
WBC: 7.7 10*3/uL (ref 4.0–10.5)

## 2014-09-26 LAB — IBC PANEL
Iron: 35 ug/dL — ABNORMAL LOW (ref 42–145)
Saturation Ratios: 12.8 % — ABNORMAL LOW (ref 20.0–50.0)
Transferrin: 195 mg/dL — ABNORMAL LOW (ref 212.0–360.0)

## 2014-09-26 LAB — COMPREHENSIVE METABOLIC PANEL
ALT: 26 U/L (ref 0–35)
AST: 18 U/L (ref 0–37)
Albumin: 3.5 g/dL (ref 3.5–5.2)
Alkaline Phosphatase: 78 U/L (ref 39–117)
BUN: 4 mg/dL — ABNORMAL LOW (ref 6–23)
CO2: 26 mEq/L (ref 19–32)
Calcium: 8.7 mg/dL (ref 8.4–10.5)
Chloride: 106 mEq/L (ref 96–112)
Creatinine, Ser: 0.61 mg/dL (ref 0.40–1.20)
GFR: 133.38 mL/min (ref >60.00)
Glucose, Bld: 107 mg/dL — ABNORMAL HIGH (ref 70–99)
Potassium: 3.7 mEq/L (ref 3.5–5.1)
Sodium: 139 mEq/L (ref 135–145)
Total Bilirubin: 0.3 mg/dL (ref 0.2–1.2)
Total Protein: 7.3 g/dL (ref 6.0–8.3)

## 2014-09-26 LAB — FERRITIN: Ferritin: 105.1 ng/mL (ref 10.0–291.0)

## 2014-09-26 MED ORDER — FERIVA 21/7 75-1 MG PO TABS: 1.0000 | ORAL_TABLET | Freq: Every day | ORAL | Status: DC

## 2014-09-26 MED ORDER — PROMETHAZINE-DM 6.25-15 MG/5ML PO SYRP: 5.0000 mL | ORAL_SOLUTION | Freq: Four times a day (QID) | ORAL | Status: DC | PRN

## 2014-09-26 NOTE — Progress Notes (Signed)
Subjective:  Patient ID: Shannon [name], female    DOB: [birth year]  Age: 49 y.o. MRN: [id number]  CC: Cough and Anemia   HPI Shannon [name] presents for a 10 day history of cough productive of white to clear phlegm. It started with a sore throat, night sweats and chills. She has not noticed any wheezing or shortness of breath. She has lost weight in the last few weeks but also reports that she recently had her LAP band filled to help her lose weight.  Outpatient Prescriptions Prior to Visit  Medication Sig Dispense Refill  . carbamazepine (TEGRETOL-XR) 100 MG 12 hr tablet Take 1 tablet (100 mg total) by mouth 2 (two) times daily. 60 tablet 2  . Cholecalciferol 50000 UNITS TABS Take 1 tablet by mouth once a week. 12 tablet 3  . clonazePAM (KLONOPIN) 1 MG tablet Take 1/2  To 1 tab as needed for anxiety 15 tablet 0  . ezetimibe-simvastatin (VYTORIN) 10-40 MG per tablet Take 1 tablet by mouth daily. 90 tablet 3  . fish oil-omega-3 fatty acids 1000 MG capsule Take 2 g by mouth daily.    . fluticasone (FLONASE) 50 MCG/ACT nasal spray Place 2 sprays into both nostrils daily. 16 g 11  . gabapentin (NEURONTIN) 600 MG tablet TAKE 1 TABLET THREE TIMES A DAY 90 tablet 5  . KLOR-CON M20 20 MEQ tablet     . levocetirizine (XYZAL) 5 MG tablet Take 1 tablet (5 mg total) by mouth every evening. 90 tablet 3  . meloxicam (MOBIC) 7.5 MG tablet TAKE 1 TABLET TWICE A DAY AS NEEDED 30 tablet 0  . Multiple Vitamin (MULTIVITAMIN) tablet Take 1 tablet by mouth daily.    . nebivolol (BYSTOLIC) 10 MG tablet Take 1 tablet (10 mg total) by mouth daily. 90 tablet 3  . NEXIUM 40 MG capsule Take 1 capsule (40 mg total) by mouth daily. Brand name only 90 capsule 3  . SEROQUEL XR 400 MG 24 hr tablet Take 1 tablet (400 mg total) by mouth at bedtime. 30 tablet 2  . vitamin E 100 UNIT capsule Take 100 Units by mouth daily.    . predniSONE (DELTASONE) 20 MG tablet TAKE 1 TABLET (20 MG TOTAL) BY MOUTH 2 (TWO) TIMES DAILY.  10 tablet 0   No facility-administered medications prior to visit.    ROS Review of Systems  Constitutional: Positive for unexpected weight change. Negative for fever, chills, diaphoresis, appetite change and fatigue.  HENT: Negative.  Negative for congestion, facial swelling, sinus pressure, sore throat and trouble swallowing.   Eyes: Negative.   Respiratory: Positive for cough. Negative for apnea, choking, chest tightness, shortness of breath, wheezing and stridor.   Cardiovascular: Negative.  Negative for chest pain, palpitations and leg swelling.  Gastrointestinal: Negative.  Negative for nausea, vomiting, abdominal pain, diarrhea, constipation and blood in stool.  Endocrine: Negative.   Genitourinary: Negative.  Negative for dysuria and difficulty urinating.  Musculoskeletal: Negative.   Skin: Negative.  Negative for rash.  Allergic/Immunologic: Negative.   Neurological: Negative.  Negative for dizziness.  Hematological: Negative.  Negative for adenopathy. Does not bruise/bleed easily.  Psychiatric/Behavioral: Negative.     Objective:  BP 140/90 mmHg  Pulse 86  Temp(Src) 98.6 F (37 C) (Oral)  Resp 12  Ht 5\' 3"  (1.6 m)  Wt 261 lb (118.389 kg)  BMI 46.25 kg/m2  SpO2 98%  BP Readings from Last 3 Encounters:  09/26/14 140/90  09/10/14 124/80  08/09/14 126/76  Wt Readings from Last 3 Encounters:  09/26/14 261 lb (118.389 kg)  09/10/14 265 lb (120.203 kg)  08/09/14 262 lb (118.842 kg)    Physical Exam  Constitutional: She is oriented to person, place, and time.  Non-toxic appearance. She does not have a sickly appearance. She does not appear ill. No distress.  HENT:  Mouth/Throat: Oropharynx is clear and moist. No oropharyngeal exudate.  Eyes: Conjunctivae are normal. Right eye exhibits no discharge. Left eye exhibits no discharge. No scleral icterus.  Neck: Normal range of motion. Neck supple. No JVD present. No tracheal deviation present. No thyromegaly present.   Cardiovascular: Normal rate, regular rhythm, normal heart sounds and intact distal pulses.  Exam reveals no gallop and no friction rub.   No murmur heard. Pulmonary/Chest: Effort normal and breath sounds normal. No accessory muscle usage or stridor. No respiratory distress. She has no decreased breath sounds. She has no wheezes. She has no rhonchi. She has no rales. She exhibits no tenderness.  Abdominal: Soft. Bowel sounds are normal. She exhibits no distension and no mass. There is no tenderness. There is no rebound and no guarding.  Musculoskeletal: Normal range of motion. She exhibits no edema or tenderness.  Lymphadenopathy:    She has no cervical adenopathy.  Neurological: She is oriented to person, place, and time.  Skin: Skin is warm and dry. No rash noted. She is not diaphoretic. No erythema. No pallor.  Vitals reviewed.   Lab Results  Component Value Date   WBC 7.7 09/26/2014   HGB 13.3 09/26/2014   HCT 38.8 09/26/2014   PLT 286.0 09/26/2014   GLUCOSE 107* 09/26/2014   CHOL 184 09/26/2014   TRIG 117.0 09/26/2014   HDL 36.90* 09/26/2014   LDLDIRECT 195.6 10/26/2012   LDLCALC 124* 09/26/2014   ALT 26 09/26/2014   AST 18 09/26/2014   NA 139 09/26/2014   K 3.7 09/26/2014   CL 106 09/26/2014   CREATININE 0.61 09/26/2014   BUN 4* 09/26/2014   CO2 26 09/26/2014   TSH 2.20 06/11/2014   HGBA1C 5.6 06/11/2014    Dg Chest 2 View  05/30/2014   CLINICAL DATA:  Cough.  EXAM: CHEST  2 VIEW  COMPARISON:  07/10/2010.  02/26/2009.  FINDINGS: Mediastinum hilar structures normal. Low lung volumes with mild basilar atelectasis. Mild right upper lobe pleural parenchymal thickening is noted. This is stable consistent with scarring. No pleural effusion or pneumothorax. No acute bony abnormality.  IMPRESSION: Low lung volumes with mild basilar atelectasis and right upper lobe pleural parenchymal scarring. Otherwise negative chest.   Electronically Signed   By: Marcello Moores  Register   On: 05/30/2014  11:56    Assessment & Plan:   Shannon [name] was seen today for cough and anemia.  Diagnoses and all orders for this visit:  Essential hypertension- her blood pressure is well-controlled, lites and renal function are stable. Orders: -     Comprehensive metabolic panel; Future  Hyperlipidemia with target LDL less than 130 Orders: -     Lipid panel; Future -     Comprehensive metabolic panel; Future  Iron deficiency anemia- her iron level remains low, I have asked her to restart iron replacement therapy. Orders: -     CBC with Differential/Platelet; Future -     Ferritin; Future -     IBC panel; Future -     FeAsp-B12-FA-C-DSS-SuccAc-Zn (FERIVA 21/7) 75-1 MG TABS; Take 1 tablet by mouth daily.  Thiamin deficiency  Hx of laparoscopic gastric banding  Cough-  her exam and chest x-ray are normal, she appears to have a viral upper respiratory infection, will treat with symptom relief with Phenergan DM. Orders: -     DG Chest 2 View; Future -     promethazine-dextromethorphan (PROMETHAZINE-DM) 6.25-15 MG/5ML syrup; Take 5 mLs by mouth 4 (four) times daily as needed for cough.  I have discontinued Ms. Detzel's predniSONE. I am also having her start on promethazine-dextromethorphan and FERIVA 21/7. Additionally, I am having her maintain her fish oil-omega-3 fatty acids, vitamin E, multivitamin, Cholecalciferol, ezetimibe-simvastatin, KLOR-CON M20, gabapentin, nebivolol, levocetirizine, fluticasone, NEXIUM, meloxicam, carbamazepine, SEROQUEL XR, and clonazePAM.  Meds ordered this encounter  Medications  . promethazine-dextromethorphan (PROMETHAZINE-DM) 6.25-15 MG/5ML syrup    Sig: Take 5 mLs by mouth 4 (four) times daily as needed for cough.    Dispense:  118 mL    Refill:  0  . FeAsp-B12-FA-C-DSS-SuccAc-Zn (FERIVA 21/7) 75-1 MG TABS    Sig: Take 1 tablet by mouth daily.    Dispense:  28 tablet    Refill:  11     Follow-up: Return in about 3 weeks (around 10/17/2014).  Scarlette Calico,  MD

## 2014-09-26 NOTE — Patient Instructions (Signed)
Cough, Adult  A cough is a reflex that helps clear your throat and airways. It can help heal the body or may be a reaction to an irritated airway. A cough may only last 2 or 3 weeks (acute) or may last more than 8 weeks (chronic).  CAUSES Acute cough:  Viral or bacterial infections. Chronic cough:  Infections.  Allergies.  Asthma.  Post-nasal drip.  Smoking.  Heartburn or acid reflux.  Some medicines.  Chronic lung problems (COPD).  Cancer. SYMPTOMS   Cough.  Fever.  Chest pain.  Increased breathing rate.  High-pitched whistling sound when breathing (wheezing).  Colored mucus that you cough up (sputum). TREATMENT   A bacterial cough may be treated with antibiotic medicine.  A viral cough must run its course and will not respond to antibiotics.  Your caregiver may recommend other treatments if you have a chronic cough. HOME CARE INSTRUCTIONS   Only take over-the-counter or prescription medicines for pain, discomfort, or fever as directed by your caregiver. Use cough suppressants only as directed by your caregiver.  Use a cold steam vaporizer or humidifier in your bedroom or home to help loosen secretions.  Sleep in a semi-upright position if your cough is worse at night.  Rest as needed.  Stop smoking if you smoke. SEEK IMMEDIATE MEDICAL CARE IF:   You have pus in your sputum.  Your cough starts to worsen.  You cannot control your cough with suppressants and are losing sleep.  You begin coughing up blood.  You have difficulty breathing.  You develop pain which is getting worse or is uncontrolled with medicine.  You have a fever. MAKE SURE YOU:   Understand these instructions.  Will watch your condition.  Will get help right away if you are not doing well or get worse. Document Released: 08/08/2010 Document Revised: 05/04/2011 Document Reviewed: 08/08/2010 Exit[employer] Patient Information 2015 [employer]. This information is not intended  to replace advice given to you by your health care provider. Make sure you discuss any questions you have with your health care provider.  

## 2014-09-26 NOTE — Progress Notes (Signed)
Pre visit review using our clinic review tool, if applicable. No additional management support is needed unless otherwise documented below in the visit note. 

## 2014-10-01 ENCOUNTER — Telehealth (HOSPITAL_COMMUNITY): Payer: Self-pay

## 2014-10-01 ENCOUNTER — Telehealth: Payer: Self-pay | Admitting: Internal Medicine

## 2014-10-01 DIAGNOSIS — K279 Peptic ulcer, site unspecified, unspecified as acute or chronic, without hemorrhage or perforation: Secondary | ICD-10-CM

## 2014-10-01 DIAGNOSIS — F319 Bipolar disorder, unspecified: Secondary | ICD-10-CM

## 2014-10-01 MED ORDER — NEXIUM 40 MG PO CPDR: 40.0000 mg | DELAYED_RELEASE_CAPSULE | Freq: Every day | ORAL | Status: DC

## 2014-10-01 NOTE — Telephone Encounter (Signed)
Patient is calling to advise that promethazine-dextromethorphan (PROMETHAZINE-DM) 6.25-15 MG/5ML syrup [[id number]] is causing choking and coughing at night. She states that she needs nexium again, and that medicaid will pay for it is it is medically necessary. She wanted to make it known that this is the second time that she has called with no success. She states that she is @ [facility] on [address] now waiting. Advised that it coul;d take some time. Please call patient with update.

## 2014-10-01 NOTE — Telephone Encounter (Signed)
Met with Dr. Adele Schilder after patient left a message she was not sleeping with Seroquel the way it is currently being prescribed and would like to go back on 50m, 2 at bedtime as she slept better with this dosage and since then has been having to take Clonazepam more at night but does not sleep as well with it either.  Dr. AAdele Schilderauthorized patient to go down to Seroquel 2081mXR but no lower due to concern for mood instability.  Called patient to discuss as she then stated she was fine taking Seroquel XR 40064mr a lower dosage of that but also wanted back regular Seroquel 41m73m at bedtime as that is what helped her sleep and helped her mood when she woke up after a good nights sleep.  States the XR does not help as much with the sleep so would like to have both.  Patient questioned if Dr. ArfeAdele Schilderld give her Seroquel XR 300mg46m then regular Seroquel 41mg,69mt bedtime too or some type of combination.  Agreed to send request back to Dr. ArfeenAdele Schilderould contact patient back once he reviewed request and patient agreed to call back on 10/02/14 evening if had not heard back by then.

## 2014-10-01 NOTE — Telephone Encounter (Signed)
Initiating PA for Nexium

## 2014-10-02 ENCOUNTER — Other Ambulatory Visit (HOSPITAL_COMMUNITY): Payer: Self-pay | Admitting: Psychiatry

## 2014-10-02 NOTE — Telephone Encounter (Signed)
KEY for PA [id number] Sent to plan 8/9. Awaiting response

## 2014-10-02 NOTE — Telephone Encounter (Signed)
Received response the medication is on the plan's list of covered drugs. Pt should be able to fill script at pharmacy.

## 2014-10-03 MED ORDER — SEROQUEL XR 300 MG PO TB24: 300.0000 mg | ORAL_TABLET | Freq: Every day | ORAL | Status: DC

## 2014-10-03 MED ORDER — QUETIAPINE FUMARATE 25 MG PO TABS
50.0000 mg | ORAL_TABLET | Freq: Every day | ORAL | Status: DC
Start: 2014-10-03 — End: 2014-12-11

## 2014-10-03 NOTE — Telephone Encounter (Signed)
Met with Dr. Adele Schilder to discuss possible changes patient is requesting due to problems with sleep and agreed to call her pharmacy to discuss past dosages of Seroquel as patient would like to return to a past dosage of medication when she received regular Seroquel at night for a total of 36m.  [facility] was getting Seroquel 268mtablets at one time plus Seroquel XR 30079m Dr. ArfAdele Schilderreed with these dosages to return to as did not want patient to go below 350m37mtal but was fine with Seroquel XR 300mg27m a day and Seroquel 25mg,43mt bedtime.  Called patient back to discuss as she was pleased and in agreement with change.  Patient agreed to call back if any problems with change and informed new orders would be sent to [facility] this date. A new Seroquel name brand order for 300mg X1mne a day and Quetiapine 25mg, 240mbedtime e-scribed to [facility] with 2 refills per authorization by [name] aAdele Schilderoquel XR 400mg dis25minued.  Patient aware of how [name] waAdele Schilderr to take changed dosages and will call back if any negative change in symptoms.

## 2014-10-15 ENCOUNTER — Ambulatory Visit: Payer: Self-pay | Admitting: Dietician

## 2014-12-11 ENCOUNTER — Encounter (HOSPITAL_COMMUNITY): Payer: Self-pay | Admitting: Psychiatry

## 2014-12-11 ENCOUNTER — Ambulatory Visit (INDEPENDENT_AMBULATORY_CARE_PROVIDER_SITE_OTHER): Payer: 59 | Admitting: Psychiatry

## 2014-12-11 VITALS — BP 138/78 | HR 98 | Ht 63.0 in | Wt 245.8 lb

## 2014-12-11 DIAGNOSIS — F410 Panic disorder [episodic paroxysmal anxiety] without agoraphobia: Secondary | ICD-10-CM | POA: Diagnosis not present

## 2014-12-11 DIAGNOSIS — F319 Bipolar disorder, unspecified: Secondary | ICD-10-CM

## 2014-12-11 MED ORDER — CARBAMAZEPINE ER 100 MG PO TB12: 100.0000 mg | ORAL_TABLET | Freq: Two times a day (BID) | ORAL | Status: DC

## 2014-12-11 MED ORDER — CLONAZEPAM 1 MG PO TABS: ORAL_TABLET | ORAL | Status: DC

## 2014-12-11 MED ORDER — TRAZODONE HCL 50 MG PO TABS: ORAL_TABLET | ORAL | Status: DC

## 2014-12-11 MED ORDER — SEROQUEL XR 300 MG PO TB24: 300.0000 mg | ORAL_TABLET | Freq: Every day | ORAL | Status: DC

## 2014-12-11 MED ORDER — QUETIAPINE FUMARATE 25 MG PO TABS: 50.0000 mg | ORAL_TABLET | Freq: Every day | ORAL | Status: DC

## 2014-12-11 NOTE — Progress Notes (Signed)
Shannon [name] Progress Note  Shannon [name] [id number] 49 y.o.  12/11/2014 1:37 PM  Chief Complaint:  I am taking medication but I still have poor sleep.         History of Present Illness: Shannon [name] came for her follow-up appointment.  She is now taking Seroquel XR 300 mg at bedtime and Seroquel 25 mg twice a day.  On her last visit we tried Seroquel 400 but she complained no improvement with increased dose decided to go back to 300.  She also wanted to go back on Seroquel 25 mg twice a day.  Overall she described her mood good but she still have insomnia.  She does not want to take higher dose of Seroquel but willing to try something different.  She does not take Klonopin every day unless she has panic and nervousness.  Her last position was given in July and she has used as needed.  She is happy as she got married 2 weeks ago.  She endorse her husband is very supportive.  She admitted that she was very nervous and anxious on her wedding day.  But she denies any hallucination.  She admitted at times feeling paranoid that people are talking about her but denies agitation, crying spells.  She is tolerating her medication.  She denies any side effects.  Her appetite is okay.  Her vitals are stable.  Patient denies drinking or using any illegal substances.  Recently she's seen her primary care physician for cough and anemia.   Suicidal Ideation: No Plan Formed: No Patient has means to carry out plan: No  Homicidal Ideation: No Plan Formed: No Patient has means to carry out plan: No  Review of Systems  Constitutional: Negative.   Eyes: Negative for blurred vision.  Respiratory: Negative for cough.   Cardiovascular: Negative for chest pain and palpitations.  Musculoskeletal: Positive for back pain.       Chronic back pain  Skin: Negative for itching and rash.  Neurological: Negative for dizziness and tingling.  Psychiatric/Behavioral: Negative for depression. The patient has  insomnia.    Medical history: Patient has iron deficiency anemia, hyperlipidemia, morbid obesity, chronic pain, peptic ulcer disease, hypertension, vitamin D deficiency, allergy rhinitis.  Her primary care physician is Dr. Ronnald Ramp.  Outpatient Encounter Prescriptions as of 12/11/2014  Medication Sig  . carbamazepine (TEGRETOL-XR) 100 MG 12 hr tablet Take 1 tablet (100 mg total) by mouth 2 (two) times daily.  . Cholecalciferol 50000 UNITS TABS Take 1 tablet by mouth once a week.  . clonazePAM (KLONOPIN) 1 MG tablet Take 1/2  To 1 tab as needed for anxiety  . ezetimibe-simvastatin (VYTORIN) 10-40 MG per tablet Take 1 tablet by mouth daily.  . FeAsp-B12-FA-C-DSS-SuccAc-Zn (FERIVA 21/7) 75-1 MG TABS Take 1 tablet by mouth daily.  . fish oil-omega-3 fatty acids 1000 MG capsule Take 2 g by mouth daily.  . fluticasone (FLONASE) 50 MCG/ACT nasal spray Place 2 sprays into both nostrils daily.  Marland Kitchen gabapentin (NEURONTIN) 600 MG tablet TAKE 1 TABLET THREE TIMES A DAY  . KLOR-CON M20 20 MEQ tablet   . levocetirizine (XYZAL) 5 MG tablet Take 1 tablet (5 mg total) by mouth every evening.  . meloxicam (MOBIC) 7.5 MG tablet TAKE 1 TABLET TWICE A DAY AS NEEDED  . Multiple Vitamin (MULTIVITAMIN) tablet Take 1 tablet by mouth daily.  . nebivolol (BYSTOLIC) 10 MG tablet Take 1 tablet (10 mg total) by mouth daily.  Marland Kitchen NEXIUM 40 MG capsule Take  1 capsule (40 mg total) by mouth daily. Brand name only  . promethazine-dextromethorphan (PROMETHAZINE-DM) 6.25-15 MG/5ML syrup Take 5 mLs by mouth 4 (four) times daily as needed for cough.  . QUEtiapine (SEROQUEL) 25 MG tablet Take 2 tablets (50 mg total) by mouth at bedtime.  . SEROQUEL XR 300 MG 24 hr tablet Take 1 tablet (300 mg total) by mouth daily.  . traZODone (DESYREL) 50 MG tablet Take 1-2 tab at bed time  . vitamin E 100 UNIT capsule Take 100 Units by mouth daily.  . [DISCONTINUED] carbamazepine (TEGRETOL-XR) 100 MG 12 hr tablet Take 1 tablet (100 mg total) by mouth  2 (two) times daily.  . [DISCONTINUED] clonazePAM (KLONOPIN) 1 MG tablet Take 1/2  To 1 tab as needed for anxiety  . [DISCONTINUED] QUEtiapine (SEROQUEL) 25 MG tablet Take 2 tablets (50 mg total) by mouth at bedtime.  . [DISCONTINUED] SEROQUEL XR 300 MG 24 hr tablet Take 1 tablet (300 mg total) by mouth daily.   No facility-administered encounter medications on file as of 12/11/2014.    Past Psychiatric History/Hospitalization(s): Patient has history of multiple psychiatric hospitalization due to suicidal thoughts, paranoia and hallucination.  She has one suicidal attempt when she cut her wrist.  She endorse mania, impulsivity, anger issues and severe depression.  Her last psychiatric hospitalization was 2014 at [facility].  In the past she had tried Zoloft, Risperdal, Zyprexa, lithium and Depakote.  She remember EPS with Risperdal.  Recently we tried Lamictal but she developed a rash.  She was also given generic Seroquel but she felt side effects with generic Seroquel. Anxiety: No Bipolar Disorder: Yes Depression: Yes Mania: Yes Psychosis: No Schizophrenia: No Personality Disorder: No Hospitalization for psychiatric illness: Yes History of Electroconvulsive Shock Therapy: No Prior Suicide Attempts: Yes  Physical Exam: Constitutional:  BP 138/78 mmHg  Pulse 98  Ht 5\' 3"  (1.6 m)  Wt 245 lb 12.8 oz (111.494 kg)  BMI 43.55 kg/m2 Pt refused  Recent Results (from the past 2160 hour(s))  Lipid panel     Status: Abnormal   Collection Time: 09/26/14  9:32 AM  Result Value Ref Range   Cholesterol 184 0 - 200 mg/dL    Comment: ATP III Classification       Desirable:  < 200 mg/dL               Borderline High:  200 - 239 mg/dL          High:  > = 240 mg/dL   Triglycerides 117.0 0.0 - 149.0 mg/dL    Comment: Normal:  <150 mg/dLBorderline High:  150 - 199 mg/dL   HDL 36.90 (L) >39.00 mg/dL   VLDL 23.4 0.0 - 40.0 mg/dL   LDL Cholesterol 124 (H) 0 - 99 mg/dL   Total CHOL/HDL  Ratio 5     Comment:                Men          Women1/2 Average Risk     3.4          3.3Average Risk          5.0          4.42X Average Risk          9.6          7.13X Average Risk          15.0          11.0  NonHDL 146.95     Comment: NOTE:  Non-HDL goal should be 30 mg/dL higher than patient's LDL goal (i.e. LDL goal of < 70 mg/dL, would have non-HDL goal of < 100 mg/dL)  Comprehensive metabolic panel     Status: Abnormal   Collection Time: 09/26/14  9:32 AM  Result Value Ref Range   Sodium 139 135 - 145 mEq/L   Potassium 3.7 3.5 - 5.1 mEq/L   Chloride 106 96 - 112 mEq/L   CO2 26 19 - 32 mEq/L   Glucose, Bld 107 (H) 70 - 99 mg/dL   BUN 4 (L) 6 - 23 mg/dL   Creatinine, Ser 0.61 0.40 - 1.20 mg/dL   Total Bilirubin 0.3 0.2 - 1.2 mg/dL   Alkaline Phosphatase 78 39 - 117 U/L   AST 18 0 - 37 U/L   ALT 26 0 - 35 U/L   Total Protein 7.3 6.0 - 8.3 g/dL   Albumin 3.5 3.5 - 5.2 g/dL   Calcium 8.7 8.4 - 10.5 mg/dL   GFR 133.38 >60.00 mL/min  CBC with Differential/Platelet     Status: None   Collection Time: 09/26/14  9:32 AM  Result Value Ref Range   WBC 7.7 4.0 - 10.5 K/uL   RBC 4.51 3.87 - 5.11 Mil/uL   Hemoglobin 13.3 12.0 - 15.0 g/dL   HCT 38.8 36.0 - 46.0 %   MCV 86.1 78.0 - 100.0 fl   MCHC 34.1 30.0 - 36.0 g/dL   RDW 14.7 11.5 - 15.5 %   Platelets 286.0 150.0 - 400.0 K/uL   Neutrophils Relative % 68.3 43.0 - 77.0 %   Lymphocytes Relative 25.4 12.0 - 46.0 %   Monocytes Relative 4.2 3.0 - 12.0 %   Eosinophils Relative 1.5 0.0 - 5.0 %   Basophils Relative 0.6 0.0 - 3.0 %   Neutro Abs 5.2 1.4 - 7.7 K/uL   Lymphs Abs 1.9 0.7 - 4.0 K/uL   Monocytes Absolute 0.3 0.1 - 1.0 K/uL   Eosinophils Absolute 0.1 0.0 - 0.7 K/uL   Basophils Absolute 0.0 0.0 - 0.1 K/uL  Ferritin     Status: None   Collection Time: 09/26/14  9:32 AM  Result Value Ref Range   Ferritin 105.1 10.0 - 291.0 ng/mL  IBC panel     Status: Abnormal   Collection Time: 09/26/14  9:32 AM   Result Value Ref Range   Iron 35 (L) 42 - 145 ug/dL   Transferrin 195.0 (L) 212.0 - 360.0 mg/dL   Saturation Ratios 12.8 (L) 20.0 - 50.0 %   General Appearance: alert, oriented, no acute distress  Musculoskeletal: Strength & Muscle Tone: within normal limits Gait & Station: normal Patient leans: N/A  Mental Status Examination/Evaluation: Patient is a middle-aged woman who is fully covered except her face due to her religious reason.  She is anxious but cooperative.  She maintained good eye contact.  Her speech is soft, fluent and coherent.  She described her mood anxious and nervous and her affect is constricted.  She denies any auditory or visual hallucination.  She denies any active or passive suicidal thoughts or homicidal thought.  There were no delusions, paranoia or any obsessive thoughts.  Her fund of knowledge is adequate.  She has no EPS, tremors or shakes.  There were no flight of ideas or any loose association.  Her memory is good.  She is alert and oriented 3.  Her attention and concentration is fair.  Her insight judgment and  impulse control is okay.  Established Problem, Stable/Improving (1), Review of Psycho-Social Stressors (1), Review of Last Therapy Session (1), Review of Medication Regimen & Side Effects (2) and Review of New Medication or Change in Dosage (2)  Assessment: Axis I; bipolar disorder with psychotic features, panic disorder without agoraphobia  Axis II; deferred  Axis III;  Past Medical History  Diagnosis Date  . ANEMIA-IRON DEFICIENCY 01/25/2009  . HYPERLIPIDEMIA 01/25/2009  . Morbid obesity ([place]) 01/25/2009  . [place] DISEASE, LUMBAR 01/25/2009  . PEPTIC ULCER DISEASE, HELICOBACTER PYLORI POSITIVE 10/03/2009  . HYPERTENSION 01/25/2009  . Chronic pain syndrome   . Allergic rhinitis   . Night sweats   . Contact lens/glasses fitting   . MANIC DEPRESSIVE ILLNESS 01/25/2009    pt is unsue if this is her specifc dx  . Bipolar 1 disorder ([place])   . Complete  tear of right rotator cuff 07/21/2013  . Anxiety     Plan Patient is doing better on her current psychiatric medication however she still have insomnia.  I recommended to try trazodone 50 mg 1-2 tablet at bedtime for insomnia.  Discussed medication side effects especially dry mouth and constipation.  I will continue Seroquel X 300 mg at bedtime, Seroquel 25 mg twice a day , Tegretol 100 mg 2 times a day and Klonopin 1 mg half to one tablet as needed for severe anxiety. Recommended to call us back if she has any question or any concern.   reinforce Tegretol level .  She is getting Neurontin from her primary care physician.   One more time I offered counseling but patient refused at this time.    Karim Aiello T., MD 12/11/2014

## 2014-12-26 ENCOUNTER — Ambulatory Visit (INDEPENDENT_AMBULATORY_CARE_PROVIDER_SITE_OTHER): Payer: Medicare Other | Admitting: Internal Medicine

## 2014-12-26 ENCOUNTER — Encounter: Payer: Self-pay | Admitting: Internal Medicine

## 2014-12-26 ENCOUNTER — Telehealth: Payer: Self-pay | Admitting: Internal Medicine

## 2014-12-26 VITALS — BP 150/98 | HR 88 | Temp 98.6°F | Wt 245.0 lb

## 2014-12-26 DIAGNOSIS — K219 Gastro-esophageal reflux disease without esophagitis: Secondary | ICD-10-CM | POA: Insufficient documentation

## 2014-12-26 DIAGNOSIS — J449 Chronic obstructive pulmonary disease, unspecified: Secondary | ICD-10-CM | POA: Diagnosis not present

## 2014-12-26 DIAGNOSIS — J4489 Other specified chronic obstructive pulmonary disease: Secondary | ICD-10-CM | POA: Insufficient documentation

## 2014-12-26 DIAGNOSIS — K279 Peptic ulcer, site unspecified, unspecified as acute or chronic, without hemorrhage or perforation: Secondary | ICD-10-CM | POA: Diagnosis not present

## 2014-12-26 DIAGNOSIS — R05 Cough: Secondary | ICD-10-CM

## 2014-12-26 DIAGNOSIS — I10 Essential (primary) hypertension: Secondary | ICD-10-CM

## 2014-12-26 DIAGNOSIS — J45909 Unspecified asthma, uncomplicated: Secondary | ICD-10-CM

## 2014-12-26 DIAGNOSIS — R059 Cough, unspecified: Secondary | ICD-10-CM

## 2014-12-26 MED ORDER — NEXIUM 40 MG PO CPDR: 40.0000 mg | DELAYED_RELEASE_CAPSULE | Freq: Two times a day (BID) | ORAL | Status: DC

## 2014-12-26 MED ORDER — PROMETHAZINE-CODEINE 6.25-10 MG/5ML PO SYRP: ORAL_SOLUTION | ORAL | Status: DC

## 2014-12-26 MED ORDER — FLUTICASONE FUROATE-VILANTEROL 100-25 MCG/INH IN AEPB: 1.0000 | INHALATION_SPRAY | Freq: Every day | RESPIRATORY_TRACT | Status: DC

## 2014-12-26 MED ORDER — VITAMIN D3 50 MCG (2000 UT) PO CAPS: 2000.0000 [IU] | ORAL_CAPSULE | Freq: Every day | ORAL | Status: DC

## 2014-12-26 NOTE — Assessment & Plan Note (Signed)
On Bystolic

## 2014-12-26 NOTE — Assessment & Plan Note (Addendum)
Poss asthmatic bronchitis, aggravated by GERD and cyclical nighttime cough Breo qd Prom cod syr at hs Increase Nexium to bid Pulm consult offered RTC Dr Ronnald Ramp

## 2014-12-26 NOTE — Progress Notes (Signed)
Pre visit review using our clinic review tool, if applicable. No additional management support is needed unless otherwise documented below in the visit note. 

## 2014-12-26 NOTE — Assessment & Plan Note (Signed)
Nexium increased to bid Stop eating late

## 2014-12-26 NOTE — Assessment & Plan Note (Addendum)
  Poss asthmatic bronchitis, aggravated by GERD and cyclical nighttime cough Breo qd Prom cod syr at hs Increase Nexium to bid Pulm consult offered RTC Dr Ronnald Ramp

## 2014-12-26 NOTE — Progress Notes (Signed)
Subjective:  Patient ID: Shannon [name], female    DOB: Sep 16, 1965  Age: 49 y.o. MRN: [id number]  CC: Cough   HPI [name] presents for night time dry cough, wheezing, hoarsness. C/o GERD sx's at night. Can't sleep due to cough. Many relatives w/asthma  Outpatient Prescriptions Prior to Visit  Medication Sig Dispense Refill  . carbamazepine (TEGRETOL-XR) 100 MG 12 hr tablet Take 1 tablet (100 mg total) by mouth 2 (two) times daily. 60 tablet 1  . clonazePAM (KLONOPIN) 1 MG tablet Take 1/2  To 1 tab as needed for anxiety 15 tablet 1  . ezetimibe-simvastatin (VYTORIN) 10-40 MG per tablet Take 1 tablet by mouth daily. 90 tablet 3  . FeAsp-B12-FA-C-DSS-SuccAc-Zn (FERIVA 21/7) 75-1 MG TABS Take 1 tablet by mouth daily. 28 tablet 11  . fish oil-omega-3 fatty acids 1000 MG capsule Take 2 g by mouth daily.    . fluticasone (FLONASE) 50 MCG/ACT nasal spray Place 2 sprays into both nostrils daily. 16 g 11  . gabapentin (NEURONTIN) 600 MG tablet TAKE 1 TABLET THREE TIMES A DAY 90 tablet 5  . KLOR-CON M20 20 MEQ tablet     . levocetirizine (XYZAL) 5 MG tablet Take 1 tablet (5 mg total) by mouth every evening. 90 tablet 3  . meloxicam (MOBIC) 7.5 MG tablet TAKE 1 TABLET TWICE A DAY AS NEEDED 30 tablet 0  . Multiple Vitamin (MULTIVITAMIN) tablet Take 1 tablet by mouth daily.    . nebivolol (BYSTOLIC) 10 MG tablet Take 1 tablet (10 mg total) by mouth daily. 90 tablet 3  . QUEtiapine (SEROQUEL) 25 MG tablet Take 2 tablets (50 mg total) by mouth at bedtime. 60 tablet 1  . SEROQUEL XR 300 MG 24 hr tablet Take 1 tablet (300 mg total) by mouth daily. 30 tablet 1  . traZODone (DESYREL) 50 MG tablet Take 1-2 tab at bed time 45 tablet 0  . vitamin E 100 UNIT capsule Take 100 Units by mouth daily.    . Cholecalciferol 50000 UNITS TABS Take 1 tablet by mouth once a week. 12 tablet 3  . NEXIUM 40 MG capsule Take 1 capsule (40 mg total) by mouth daily. Brand name only 90 capsule 3  .  promethazine-dextromethorphan (PROMETHAZINE-DM) 6.25-15 MG/5ML syrup Take 5 mLs by mouth 4 (four) times daily as needed for cough. 118 mL 0   No facility-administered medications prior to visit.    ROS Review of Systems  Constitutional: Negative for chills, activity change, appetite change, fatigue and unexpected weight change.  HENT: Positive for rhinorrhea. Negative for congestion, mouth sores, nosebleeds, postnasal drip, sinus pressure and sneezing.   Eyes: Negative for visual disturbance.  Respiratory: Positive for cough and wheezing. Negative for chest tightness.   Gastrointestinal: Negative for nausea and abdominal pain.  Genitourinary: Negative for frequency, difficulty urinating and vaginal pain.  Musculoskeletal: Negative for back pain and gait problem.  Skin: Negative for pallor and rash.  Neurological: Negative for dizziness, tremors, weakness, numbness and headaches.  Psychiatric/Behavioral: Negative for confusion and sleep disturbance.    Objective:  BP 150/98 mmHg  Pulse 88  Temp(Src) 98.6 F (37 C) (Oral)  Wt 245 lb (111.131 kg)  SpO2 97%  BP Readings from Last 3 Encounters:  12/26/14 150/98  12/11/14 138/78  09/26/14 140/90    Wt Readings from Last 3 Encounters:  12/26/14 245 lb (111.131 kg)  12/11/14 245 lb 12.8 oz (111.494 kg)  09/26/14 261 lb (118.389 kg)    Physical Exam  Constitutional: She appears well-developed. No distress.  HENT:  Head: Normocephalic.  Right Ear: External ear normal.  Left Ear: External ear normal.  Nose: Nose normal.  Mouth/Throat: Oropharynx is clear and moist.  Eyes: Conjunctivae are normal. Pupils are equal, round, and reactive to light. Right eye exhibits no discharge. Left eye exhibits no discharge.  Neck: Normal range of motion. Neck supple. No JVD present. No tracheal deviation present. No thyromegaly present.  Cardiovascular: Normal rate, regular rhythm and normal heart sounds.   Pulmonary/Chest: No stridor. No  respiratory distress. She has no wheezes.  Abdominal: Soft. Bowel sounds are normal. She exhibits no distension and no mass. There is no tenderness. There is no rebound and no guarding.  Musculoskeletal: She exhibits no edema or tenderness.  Lymphadenopathy:    She has no cervical adenopathy.  Neurological: She displays normal reflexes. No cranial nerve deficit. She exhibits normal muscle tone. Coordination normal.  Skin: No rash noted. No erythema.  Psychiatric: She has a normal mood and affect. Her behavior is normal. Judgment and thought content normal.  Obese  Lab Results  Component Value Date   WBC 7.7 09/26/2014   HGB 13.3 09/26/2014   HCT 38.8 09/26/2014   PLT 286.0 09/26/2014   GLUCOSE 107* 09/26/2014   CHOL 184 09/26/2014   TRIG 117.0 09/26/2014   HDL 36.90* 09/26/2014   LDLDIRECT 195.6 10/26/2012   LDLCALC 124* 09/26/2014   ALT 26 09/26/2014   AST 18 09/26/2014   NA 139 09/26/2014   K 3.7 09/26/2014   CL 106 09/26/2014   CREATININE 0.61 09/26/2014   BUN 4* 09/26/2014   CO2 26 09/26/2014   TSH 2.20 06/11/2014   HGBA1C 5.6 06/11/2014    Dg Chest 2 View  05/30/2014  CLINICAL DATA:  Cough. EXAM: CHEST  2 VIEW COMPARISON:  07/10/2010.  02/26/2009. FINDINGS: Mediastinum hilar structures normal. Low lung volumes with mild basilar atelectasis. Mild right upper lobe pleural parenchymal thickening is noted. This is stable consistent with scarring. No pleural effusion or pneumothorax. No acute bony abnormality. IMPRESSION: Low lung volumes with mild basilar atelectasis and right upper lobe pleural parenchymal scarring. Otherwise negative chest. Electronically Signed   By: Marcello Moores  Register   On: 05/30/2014 11:56   I personally provided Breo inhaler use teaching. After the teaching patient was able to demonstrate it's use effectively. All questions were answered   Assessment & Plan:   [name] was seen today for cough.  Diagnoses and all orders for this visit:  Peptic  ulcer -     Discontinue: NEXIUM 40 MG capsule; Take 1 capsule (40 mg total) by mouth 2 (two) times daily before a meal. Brand name only -     NEXIUM 40 MG capsule; Take 1 capsule (40 mg total) by mouth 2 (two) times daily before a meal. Brand name only  PUD (peptic ulcer disease) -     Discontinue: NEXIUM 40 MG capsule; Take 1 capsule (40 mg total) by mouth 2 (two) times daily before a meal. Brand name only -     NEXIUM 40 MG capsule; Take 1 capsule (40 mg total) by mouth 2 (two) times daily before a meal. Brand name only  Essential hypertension  Cough  Asthmatic bronchitis , chronic (HCC)  Gastroesophageal reflux disease without esophagitis  Other orders -     promethazine-codeine (PHENERGAN WITH CODEINE) 6.25-10 MG/5ML syrup; 5-10 ml at night prn -     Fluticasone Furoate-Vilanterol (BREO ELLIPTA) 100-25 MCG/INH AEPB; Inhale 1 Act into  the lungs daily. -     Cholecalciferol (VITAMIN D3) 2000 UNITS capsule; Take 1 capsule (2,000 Units total) by mouth daily.   I have discontinued Ms. Epps's Cholecalciferol and promethazine-dextromethorphan. I am also having her start on promethazine-codeine, Fluticasone Furoate-Vilanterol, and Vitamin D3. Additionally, I am having her maintain her fish oil-omega-3 fatty acids, vitamin E, multivitamin, ezetimibe-simvastatin, KLOR-CON M20, gabapentin, nebivolol, levocetirizine, fluticasone, meloxicam, FERIVA 21/7, clonazePAM, SEROQUEL XR, QUEtiapine, carbamazepine, traZODone, and NEXIUM.  Meds ordered this encounter  Medications  . promethazine-codeine (PHENERGAN WITH CODEINE) 6.25-10 MG/5ML syrup    Sig: 5-10 ml at night prn    Dispense:  300 mL    Refill:  0  . DISCONTD: NEXIUM 40 MG capsule    Sig: Take 1 capsule (40 mg total) by mouth 2 (two) times daily before a meal. Brand name only    Dispense:  180 capsule    Refill:  3  . NEXIUM 40 MG capsule    Sig: Take 1 capsule (40 mg total) by mouth 2 (two) times daily before a meal. Brand name only     Dispense:  180 capsule    Refill:  3  . Fluticasone Furoate-Vilanterol (BREO ELLIPTA) 100-25 MCG/INH AEPB    Sig: Inhale 1 Act into the lungs daily.    Dispense:  1 each    Refill:  5  . Cholecalciferol (VITAMIN D3) 2000 UNITS capsule    Sig: Take 1 capsule (2,000 Units total) by mouth daily.    Dispense:  100 capsule    Refill:  3     Follow-up: Return in about 4 weeks (around 01/23/2015) for a follow-up visit.  Walker Kehr, MD

## 2014-12-26 NOTE — Telephone Encounter (Signed)
Patient states that Dr. Camila Li sent over a script to her pharmacy for Nexium.  States the script needs to say that it is medically necessary for patient to take Nexium and not generic form.

## 2014-12-27 MED ORDER — NEXIUM 40 MG PO CPDR: 40.0000 mg | DELAYED_RELEASE_CAPSULE | Freq: Two times a day (BID) | ORAL | Status: DC

## 2014-12-27 NOTE — Telephone Encounter (Signed)
Resent [facility]ium to [facility].../lmb

## 2015-01-02 ENCOUNTER — Other Ambulatory Visit: Payer: Self-pay | Admitting: *Deleted

## 2015-01-02 ENCOUNTER — Ambulatory Visit
Admission: RE | Admit: 2015-01-02 | Discharge: 2015-01-02 | Disposition: A | Discharge status: Home / Self Care | Payer: Medicare Other | Source: Ambulatory Visit | Attending: Physician Assistant | Admitting: Physician Assistant

## 2015-01-02 ENCOUNTER — Other Ambulatory Visit: Payer: Self-pay | Admitting: Physician Assistant

## 2015-01-02 DIAGNOSIS — Z4651 Encounter for fitting and adjustment of gastric lap band: Secondary | ICD-10-CM

## 2015-01-02 DIAGNOSIS — R1110 Vomiting, unspecified: Secondary | ICD-10-CM | POA: Diagnosis not present

## 2015-01-07 ENCOUNTER — Other Ambulatory Visit (HOSPITAL_COMMUNITY): Payer: Self-pay | Admitting: Psychiatry

## 2015-01-07 DIAGNOSIS — F319 Bipolar disorder, unspecified: Secondary | ICD-10-CM

## 2015-01-09 NOTE — Telephone Encounter (Signed)
Met with Dr. Taylor who approved a one time refill of patient's prescribed Trazodone.  New one time order e-scribed to patient's [facility] on [address] as authorized and patient to return for next evaluation with [name] on 02/05/15. 

## 2015-01-24 ENCOUNTER — Encounter: Payer: Self-pay | Admitting: Internal Medicine

## 2015-01-24 ENCOUNTER — Ambulatory Visit (INDEPENDENT_AMBULATORY_CARE_PROVIDER_SITE_OTHER): Payer: Medicare Other | Admitting: Internal Medicine

## 2015-01-24 VITALS — BP 128/82 | HR 76 | Temp 98.2°F | Resp 16 | Ht 63.0 in | Wt 254.0 lb

## 2015-01-24 DIAGNOSIS — Z1211 Encounter for screening for malignant neoplasm of colon: Secondary | ICD-10-CM | POA: Insufficient documentation

## 2015-01-24 DIAGNOSIS — Z1231 Encounter for screening mammogram for malignant neoplasm of breast: Secondary | ICD-10-CM

## 2015-01-24 DIAGNOSIS — I10 Essential (primary) hypertension: Secondary | ICD-10-CM

## 2015-01-24 DIAGNOSIS — K219 Gastro-esophageal reflux disease without esophagitis: Secondary | ICD-10-CM | POA: Diagnosis not present

## 2015-01-24 NOTE — Progress Notes (Signed)
Pre visit review using our clinic review tool, if applicable. No additional management support is needed unless otherwise documented below in the visit note. 

## 2015-01-24 NOTE — Progress Notes (Signed)
Subjective:  Patient ID: Shannon [name], female    DOB: 10/26/1965  Age: 49 y.o. MRN: [id number]  CC: Gastroesophageal Reflux and Hypertension   HPI Shannon [name] presents for follow-up after recent episode of what was thought to be peptic ulcer disease. She has since seen her lap band surgeon and was told that there was too much fluid in her lap band fill. The fluid has been removed and her abdomen feels much better. All of her GI symptoms have resolved. Unfortunately she has gained 10 pounds.  Outpatient Prescriptions Prior to Visit  Medication Sig Dispense Refill  . Cholecalciferol (VITAMIN D3) 2000 UNITS capsule Take 1 capsule (2,000 Units total) by mouth daily. 100 capsule 3  . clonazePAM (KLONOPIN) 1 MG tablet Take 1/2  To 1 tab as needed for anxiety 15 tablet 1  . ezetimibe-simvastatin (VYTORIN) 10-40 MG per tablet Take 1 tablet by mouth daily. 90 tablet 3  . FeAsp-B12-FA-C-DSS-SuccAc-Zn (FERIVA 21/7) 75-1 MG TABS Take 1 tablet by mouth daily. 28 tablet 11  . gabapentin (NEURONTIN) 600 MG tablet TAKE 1 TABLET THREE TIMES A DAY 90 tablet 5  . KLOR-CON M20 20 MEQ tablet     . Multiple Vitamin (MULTIVITAMIN) tablet Take 1 tablet by mouth daily.    . nebivolol (BYSTOLIC) 10 MG tablet Take 1 tablet (10 mg total) by mouth daily. 90 tablet 3  . NEXIUM 40 MG capsule Take 1 capsule (40 mg total) by mouth 2 (two) times daily before a meal. Brand name only 180 capsule 3  . QUEtiapine (SEROQUEL) 25 MG tablet Take 2 tablets (50 mg total) by mouth at bedtime. 60 tablet 1  . traZODone (DESYREL) 50 MG tablet TAKE 1-2 TAB AT BED TIME 45 tablet 0  . carbamazepine (TEGRETOL-XR) 100 MG 12 hr tablet Take 1 tablet (100 mg total) by mouth 2 (two) times daily. 60 tablet 1  . fish oil-omega-3 fatty acids 1000 MG capsule Take 2 g by mouth daily.    . fluticasone (FLONASE) 50 MCG/ACT nasal spray Place 2 sprays into both nostrils daily. 16 g 11  . Fluticasone Furoate-Vilanterol (BREO ELLIPTA) 100-25  MCG/INH AEPB Inhale 1 Act into the lungs daily. 1 each 5  . levocetirizine (XYZAL) 5 MG tablet Take 1 tablet (5 mg total) by mouth every evening. 90 tablet 3  . meloxicam (MOBIC) 7.5 MG tablet TAKE 1 TABLET TWICE A DAY AS NEEDED 30 tablet 0  . promethazine-codeine (PHENERGAN WITH CODEINE) 6.25-10 MG/5ML syrup 5-10 ml at night prn 300 mL 0  . SEROQUEL XR 300 MG 24 hr tablet Take 1 tablet (300 mg total) by mouth daily. 30 tablet 1  . vitamin E 100 UNIT capsule Take 100 Units by mouth daily.     No facility-administered medications prior to visit.    ROS Review of Systems  Constitutional: Positive for unexpected weight change. Negative for fever, chills, diaphoresis, appetite change and fatigue.  HENT: Negative.   Eyes: Negative.   Respiratory: Negative.  Negative for cough, choking, chest tightness, shortness of breath and stridor.   Cardiovascular: Negative.  Negative for chest pain, palpitations and leg swelling.  Gastrointestinal: Negative.  Negative for nausea, vomiting, abdominal pain, diarrhea, constipation and blood in stool.  Endocrine: Negative.   Genitourinary: Negative.  Negative for dysuria, urgency, frequency, hematuria, decreased urine volume and difficulty urinating.  Musculoskeletal: Negative.  Negative for myalgias, back pain, joint swelling and arthralgias.  Skin: Negative.  Negative for rash.  Allergic/Immunologic: Negative.   Neurological:  Negative.  Negative for dizziness, weakness and light-headedness.  Hematological: Negative.  Negative for adenopathy. Does not bruise/bleed easily.  Psychiatric/Behavioral: Negative.     Objective:  BP 128/82 mmHg  Pulse 76  Temp(Src) 98.2 F (36.8 C) (Oral)  Resp 16  Ht 5\' 3"  (1.6 m)  Wt 254 lb (115.214 kg)  BMI 45.01 kg/m2  SpO2 97%  BP Readings from Last 3 Encounters:  01/24/15 128/82  12/26/14 150/98  12/11/14 138/78    Wt Readings from Last 3 Encounters:  01/24/15 254 lb (115.214 kg)  12/26/14 245 lb (111.131  kg)  12/11/14 245 lb 12.8 oz (111.494 kg)    Physical Exam  Constitutional: She is oriented to person, place, and time. She appears well-developed and well-nourished. No distress.  HENT:  Head: Normocephalic and atraumatic.  Mouth/Throat: Oropharynx is clear and moist. No oropharyngeal exudate.  Eyes: Conjunctivae are normal. Right eye exhibits no discharge. Left eye exhibits no discharge. No scleral icterus.  Neck: Normal range of motion. Neck supple. No JVD present. No tracheal deviation present. No thyromegaly present.  Cardiovascular: Normal rate, regular rhythm, normal heart sounds and intact distal pulses.  Exam reveals no gallop and no friction rub.   No murmur heard. Pulmonary/Chest: Effort normal and breath sounds normal. No stridor. No respiratory distress. She has no wheezes. She has no rales. She exhibits no tenderness.  Abdominal: Soft. Bowel sounds are normal. She exhibits no distension and no mass. There is no hepatosplenomegaly, splenomegaly or hepatomegaly. There is no tenderness. There is no rebound, no guarding and no CVA tenderness. No hernia. Hernia confirmed negative in the ventral area, confirmed negative in the right inguinal area and confirmed negative in the left inguinal area.  Musculoskeletal: Normal range of motion. She exhibits no edema or tenderness.  Lymphadenopathy:    She has no cervical adenopathy.  Neurological: She is oriented to person, place, and time.  Skin: Skin is warm and dry. No rash noted. She is not diaphoretic. No erythema. No pallor.    Lab Results  Component Value Date   WBC 7.7 09/26/2014   HGB 13.3 09/26/2014   HCT 38.8 09/26/2014   PLT 286.0 09/26/2014   GLUCOSE 107* 09/26/2014   CHOL 184 09/26/2014   TRIG 117.0 09/26/2014   HDL 36.90* 09/26/2014   LDLDIRECT 195.6 10/26/2012   LDLCALC 124* 09/26/2014   ALT 26 09/26/2014   AST 18 09/26/2014   NA 139 09/26/2014   K 3.7 09/26/2014   CL 106 09/26/2014   CREATININE 0.61 09/26/2014     BUN 4* 09/26/2014   CO2 26 09/26/2014   TSH 2.20 06/11/2014   HGBA1C 5.6 06/11/2014    Dg Abd 1 View  01/02/2015  CLINICAL DATA:  Chronic vomiting.  Lap band. EXAM: ABDOMEN - 1 VIEW COMPARISON:  07/15/2010 FINDINGS: The lap band appears in good position with normal orientation, unchanged since the prior exam. There are no dilated loops of large or small bowel. No visible free air or free fluid or osseous abnormality. IMPRESSION: Benign-appearing abdomen. Lap band appears in good position, unchanged. Electronically Signed   By: Lorriane Shire M.D.   On: 01/02/2015 16:40    Assessment & Plan:   Shannon [name] was seen today for gastroesophageal reflux and hypertension.  Diagnoses and all orders for this visit:  Gastroesophageal reflux disease without esophagitis  Visit for screening mammogram -     MM DIGITAL SCREENING BILATERAL; Future  Colon cancer screening -     Ambulatory referral to Gastroenterology  I have discontinued Shannon [name]'s fish oil-omega-3 fatty acids, vitamin E, levocetirizine, fluticasone, meloxicam, SEROQUEL XR, carbamazepine, promethazine-codeine, and Fluticasone Furoate-Vilanterol. I am also having her maintain her multivitamin, ezetimibe-simvastatin, KLOR-CON M20, gabapentin, nebivolol, FERIVA 21/7, clonazePAM, QUEtiapine, Vitamin D3, NEXIUM, and traZODone.  No orders of the defined types were placed in this encounter.     Follow-up: Return in about 6 months (around 07/25/2015).  Scarlette Calico, MD

## 2015-01-24 NOTE — Patient Instructions (Signed)

## 2015-01-26 NOTE — Assessment & Plan Note (Signed)
improvement noted 

## 2015-01-26 NOTE — Assessment & Plan Note (Signed)
Her BP is well controlled 

## 2015-02-05 ENCOUNTER — Encounter (HOSPITAL_COMMUNITY): Payer: Self-pay | Admitting: Psychiatry

## 2015-02-05 ENCOUNTER — Ambulatory Visit (INDEPENDENT_AMBULATORY_CARE_PROVIDER_SITE_OTHER): Payer: Medicare Other | Admitting: Psychiatry

## 2015-02-05 VITALS — BP 128/80 | HR 82 | Ht 64.0 in | Wt 262.4 lb

## 2015-02-05 DIAGNOSIS — F410 Panic disorder [episodic paroxysmal anxiety] without agoraphobia: Secondary | ICD-10-CM

## 2015-02-05 DIAGNOSIS — F319 Bipolar disorder, unspecified: Secondary | ICD-10-CM | POA: Diagnosis not present

## 2015-02-05 MED ORDER — CLONAZEPAM 1 MG PO TABS: ORAL_TABLET | ORAL | Status: DC

## 2015-02-05 MED ORDER — TRAZODONE HCL 100 MG PO TABS: 100.0000 mg | ORAL_TABLET | Freq: Every day | ORAL | Status: DC

## 2015-02-05 MED ORDER — SEROQUEL XR 300 MG PO TB24: 300.0000 mg | ORAL_TABLET | Freq: Every day | ORAL | Status: DC

## 2015-02-05 NOTE — Progress Notes (Signed)
[name] [phone] Progress Note  [name] [id number] 49 y.o.  02/05/2015 3:49 PM  Chief Complaint:  I stop taking Tegretol and Seroquel.  I want to cut down some of medication.           History of Present Illness: Shannon [name] came for her follow-up appointment.  She is no longer taking Seroquel 25 mg twice a day but she is still taking Seroquel XR 300 mg at bedtime.  She wants to come off from her psychiatric medication he cut she believe she is taking too much.  She also stopped taking Tegretol.  She is taking Klonopin and she like trazodone which is helping her sleep.  Recently she's seen her primary care physician for abdominal pain.  She admitted sometime feeling anxious and nervous but denies any agitation, irritability or significant mood swing.  She sleeping on and off but denies any paranoia or any feeling of hopelessness or worthlessness.  She has no tremors or shakes.  She is prescribed, parent and 61 mg 3 times a day but she is only taking twice a day and she like to come off from gabapentin because she believe it is not working.  Patient has chronic back pain and anxiety.  She denies any paranoia or any delusions.  She denies any crying spells.  She has no tremors or shakes.  She mentioned her husband is very supportive.  Patient denies drinking or using any illegal substances.  Her appetite is okay.  Her vitals are stable.  Suicidal Ideation: No Plan Formed: No Patient has means to carry out plan: No  Homicidal Ideation: No Plan Formed: No Patient has means to carry out plan: No  Review of Systems  Constitutional: Negative.   Eyes: Negative for blurred vision.  Respiratory: Negative for cough.   Cardiovascular: Negative for chest pain and palpitations.  Musculoskeletal: Positive for back pain.       Chronic back pain  Skin: Negative for itching and rash.  Neurological: Negative for dizziness and tingling.  Psychiatric/Behavioral: Negative for suicidal ideas,  hallucinations and substance abuse.   Medical history: Patient has iron deficiency anemia, hyperlipidemia, morbid obesity, chronic pain, peptic ulcer disease, hypertension, vitamin D deficiency, allergy rhinitis.  Her primary care physician is Dr. Ronnald Ramp.  Outpatient Encounter Prescriptions as of 02/05/2015  Medication Sig  . Cholecalciferol (VITAMIN D3) 2000 UNITS capsule Take 1 capsule (2,000 Units total) by mouth daily.  . clonazePAM (KLONOPIN) 1 MG tablet Take 1/2  To 1 tab as needed for anxiety  . ezetimibe-simvastatin (VYTORIN) 10-40 MG per tablet Take 1 tablet by mouth daily.  . FeAsp-B12-FA-C-DSS-SuccAc-Zn (FERIVA 21/7) 75-1 MG TABS Take 1 tablet by mouth daily.  Marland Kitchen gabapentin (NEURONTIN) 600 MG tablet TAKE 1 TABLET THREE TIMES A DAY  . KLOR-CON M20 20 MEQ tablet   . Multiple Vitamin (MULTIVITAMIN) tablet Take 1 tablet by mouth daily.  . nebivolol (BYSTOLIC) 10 MG tablet Take 1 tablet (10 mg total) by mouth daily.  Marland Kitchen NEXIUM 40 MG capsule Take 1 capsule (40 mg total) by mouth 2 (two) times daily before a meal. Brand name only  . SEROQUEL XR 300 MG 24 hr tablet Take 1 tablet (300 mg total) by mouth at bedtime.  . traZODone (DESYREL) 100 MG tablet Take 1 tablet (100 mg total) by mouth at bedtime.  . [DISCONTINUED] clonazePAM (KLONOPIN) 1 MG tablet Take 1/2  To 1 tab as needed for anxiety  . [DISCONTINUED] QUEtiapine (SEROQUEL) 25 MG tablet Take 2  tablets (50 mg total) by mouth at bedtime.  . [DISCONTINUED] SEROQUEL XR 300 MG 24 hr tablet   . [DISCONTINUED] traZODone (DESYREL) 50 MG tablet TAKE 1-2 TAB AT BED TIME   No facility-administered encounter medications on file as of 02/05/2015.    Past Psychiatric History/Hospitalization(s): Patient has history of multiple psychiatric hospitalization due to suicidal thoughts, paranoia and hallucination.  She has one suicidal attempt when she cut her wrist.  She endorse mania, impulsivity, anger issues and severe depression.  Her last  psychiatric hospitalization was 2014 at [facility].  In the past she had tried Zoloft, Risperdal, Zyprexa, lithium and Depakote.  She remember EPS with Risperdal.  Recently we tried Lamictal but she developed a rash.  She was also given generic Seroquel but she felt side effects with generic Seroquel. Anxiety: No Bipolar Disorder: Yes Depression: Yes Mania: Yes Psychosis: No Schizophrenia: No Personality Disorder: No Hospitalization for psychiatric illness: Yes History of Electroconvulsive Shock Therapy: No Prior Suicide Attempts: Yes  Physical Exam: Constitutional:  BP 128/80 mmHg  Pulse 82  Ht 5\' 4"  (1.626 m)  Wt 262 lb 6.4 oz (119.024 kg)  BMI 45.02 kg/m2 Pt refused  No results found for this or any previous visit (from the past 2160 hour(s)). General Appearance: alert, oriented, no acute distress  Musculoskeletal: Strength & Muscle Tone: within normal limits Gait & Station: normal Patient leans: N/A  Mental Status Examination/Evaluation: Patient is a middle-aged woman who is fully covered except her face due to her religious reason.  She is anxious but cooperative.  She maintained good eye contact.  Her speech is soft, fluent and coherent.  She described her mood anxious and nervous and her affect is constricted.  She denies any auditory or visual hallucination.  She denies any active or passive suicidal thoughts or homicidal thought.  There were no delusions, paranoia or any obsessive thoughts.  Her fund of knowledge is adequate.  She has no EPS, tremors or shakes.  There were no flight of ideas or any loose association.  Her memory is good.  She is alert and oriented 3.  Her attention and concentration is fair.  Her insight judgment and impulse control is okay.  Established Problem, Stable/Improving (1), Review of Psycho-Social Stressors (1), Review of Last Therapy Session (1), Review of Medication Regimen & Side Effects (2) and Review of New Medication or Change  in Dosage (2)  Assessment: Axis I; bipolar disorder with psychotic features, panic disorder without agoraphobia  Axis II; deferred  Axis III;  Past Medical History  Diagnosis Date  . ANEMIA-IRON DEFICIENCY 01/25/2009  . HYPERLIPIDEMIA 01/25/2009  . Morbid obesity ([place]) 01/25/2009  . [place] DISEASE, LUMBAR 01/25/2009  . PEPTIC ULCER DISEASE, HELICOBACTER PYLORI POSITIVE 10/03/2009  . HYPERTENSION 01/25/2009  . Chronic pain syndrome   . Allergic rhinitis   . Night sweats   . Contact lens/glasses fitting   . MANIC DEPRESSIVE ILLNESS 01/25/2009    pt is unsue if this is her specifc dx  . Bipolar 1 disorder ([place])   . Complete tear of right rotator cuff 07/21/2013  . Anxiety     Plan Patient like to continue trazodone 100 mg at bedtime as it is helping her sleep.  She is not interested to take Tegretol at this time.  I encouraged to continue gabapentin which is prescribed by her primary care physician to help her anxiety and chronic pain.  Patient told that she like to come off but she would look into  that.  She will continue Seroquel XR 300 mg at bedtime and Klonopin 1 mg half to one tablet as needed for severe anxiety and nervousness.  I explained if she started to have more mood swing, depression and irritability and she may need to go back on Tegretol.  Recommended to call us back if she has any question or any concern.  I also offered counseling but patient declined.  Follow-up in 2 months.  Louis Ivery T., MD 02/05/2015

## 2015-02-15 ENCOUNTER — Other Ambulatory Visit: Payer: Self-pay | Admitting: Internal Medicine

## 2015-03-14 ENCOUNTER — Other Ambulatory Visit: Payer: Self-pay | Admitting: Internal Medicine

## 2015-04-04 ENCOUNTER — Other Ambulatory Visit: Payer: Self-pay | Admitting: Internal Medicine

## 2015-04-08 ENCOUNTER — Ambulatory Visit (HOSPITAL_COMMUNITY): Payer: Self-pay | Admitting: Psychiatry

## 2015-04-11 ENCOUNTER — Other Ambulatory Visit (HOSPITAL_COMMUNITY): Payer: Self-pay | Admitting: Psychiatry

## 2015-04-11 DIAGNOSIS — F319 Bipolar disorder, unspecified: Secondary | ICD-10-CM

## 2015-04-16 ENCOUNTER — Ambulatory Visit (HOSPITAL_COMMUNITY): Payer: Self-pay | Admitting: Psychiatry

## 2015-04-16 MED ORDER — CLONAZEPAM 1 MG PO TABS: ORAL_TABLET | ORAL | Status: DC

## 2015-04-16 NOTE — Telephone Encounter (Signed)
Met with Dr. Adele Schilder to arrange to contact patient to verify she is going to be returning to see him and need for medication refills.   Dr. Adele Schilder authorized a one time refill of medications if patient reported need. Contacted patient who reported she could not come in this date for scheduled evaluation as states she had a stomach virus and concerned for spreading it to others.  Patient rescheduled for Dr. Adele Schilder for 04/22/15 at 2:30pm and agreed to call in one time refill of medication.  Called in a new one time Klonopin order to patient's [facility] on [address] with [place], pharmacist and informed per Dr. Adele Schilder no further refills until patient evaluated.  E-scribed in one time refill orders for patient's prescribed Seroquel XR 300 mg name brand and Trazodone 156m to the [facility].  Patient to keep appointment now set for 04/22/15 for any further refills.

## 2015-04-22 ENCOUNTER — Ambulatory Visit (INDEPENDENT_AMBULATORY_CARE_PROVIDER_SITE_OTHER): Payer: Medicare Other | Admitting: Psychiatry

## 2015-04-22 ENCOUNTER — Encounter (HOSPITAL_COMMUNITY): Payer: Self-pay | Admitting: Psychiatry

## 2015-04-22 VITALS — BP 129/86 | HR 78 | Ht 63.0 in | Wt 279.8 lb

## 2015-04-22 DIAGNOSIS — F410 Panic disorder [episodic paroxysmal anxiety] without agoraphobia: Secondary | ICD-10-CM

## 2015-04-22 DIAGNOSIS — F319 Bipolar disorder, unspecified: Secondary | ICD-10-CM | POA: Diagnosis not present

## 2015-04-22 MED ORDER — CLONAZEPAM 0.5 MG PO TABS: 0.5000 mg | ORAL_TABLET | Freq: Two times a day (BID) | ORAL | Status: DC

## 2015-04-22 MED ORDER — SEROQUEL XR 300 MG PO TB24: ORAL_TABLET | ORAL | Status: DC

## 2015-04-22 MED ORDER — TRAZODONE HCL 100 MG PO TABS: 100.0000 mg | ORAL_TABLET | Freq: Every day | ORAL | Status: DC

## 2015-04-22 NOTE — Progress Notes (Signed)
[facility] [phone] Progress Note  Shannon [name] [id number] 50 y.o.  04/22/2015 3:02 PM  Chief Complaint:  I am gaining weight.  I'm not happy.  I still feel anxious and nervous.          History of Present Illness: Shannon [name] came for her follow-up appointment.  She is taking Seroquel XR 300 mg , trazodone 100 mg at bedtime and Klonopin 1 mg half to one tablet as needed.  She continued to feel nervous anxious .  She is taking gabapentin 600 mg twice a day prescribed by primary care physician.  She is not sure why she was given because she do not recall any neuropathy. She believe it was given by psychiatrist in [place] for bipolar disorder which was continued by her primary care physician.  She has gained weight since started gabapentin .  Patient also had history of lap band surgery and she believe it stopped working.  She scheduled to see surgeon on March 10 2 to evaluate.  She admitted lately feeling easily tired, lack of motivation, and nervous.  She also endorse having panic attacks and nervous attack when she goes into public places.  She believe Klonopin helped her but she does not have enough tablets.  She denies any paranoia or any hallucination but admitted irritability, frustration and mood swings.  She is offered Tegretol but at that time she wants to come off from medication decided not to take it.  Her husband is very supportive.  Patient denies drinking or using any illegal substances.  She does not ask for early refills for her psychiatric medication.  She has gained more than 15 pounds in past 2 months.  Suicidal Ideation: No Plan Formed: No Patient has means to carry out plan: No  Homicidal Ideation: No Plan Formed: No Patient has means to carry out plan: No  Review of Systems  Constitutional: Positive for malaise/fatigue.       Weight gain  Eyes: Negative for blurred vision.  Respiratory: Negative for cough.   Cardiovascular: Negative for chest pain and  palpitations.  Musculoskeletal: Positive for back pain.       Chronic back pain  Skin: Negative for itching and rash.  Neurological: Negative for dizziness and tingling.  Psychiatric/Behavioral: Negative for suicidal ideas, hallucinations and substance abuse. The patient is nervous/anxious.    Medical history: Patient has iron deficiency anemia, hyperlipidemia, morbid obesity, chronic pain, peptic ulcer disease, hypertension, vitamin D deficiency, allergy rhinitis.  Her primary care physician is Dr. Ronnald Ramp.  Outpatient Encounter Prescriptions as of 04/22/2015  Medication Sig  . [DISCONTINUED] gabapentin (NEURONTIN) 600 MG tablet TAKE 1 TABLET THREE TIMES A DAY  . BYSTOLIC 10 MG tablet TAKE 1 TABLET (10 MG TOTAL) BY MOUTH DAILY.  Marland Kitchen Cholecalciferol (VITAMIN D3) 2000 UNITS capsule Take 1 capsule (2,000 Units total) by mouth daily.  . clonazePAM (KLONOPIN) 0.5 MG tablet Take 1 tablet (0.5 mg total) by mouth 2 (two) times daily.  . [facility] B-1 100 MG tablet TAKE 1 TABLET (100 MG TOTAL) BY MOUTH DAILY.  . FeAsp-B12-FA-C-DSS-SuccAc-Zn (FERIVA 21/7) 75-1 MG TABS Take 1 tablet by mouth daily.  Marland Kitchen KLOR-CON M20 20 MEQ tablet   . Multiple Vitamin (MULTIVITAMIN) tablet Take 1 tablet by mouth daily.  Marland Kitchen NEXIUM 40 MG capsule Take 1 capsule (40 mg total) by mouth 2 (two) times daily before a meal. Brand name only  . SEROQUEL XR 300 MG 24 hr tablet TAKE 1 TABLET (300 MG TOTAL) BY  MOUTH AT BEDTIME.  . traZODone (DESYREL) 100 MG tablet Take 1 tablet (100 mg total) by mouth at bedtime.  Marland Kitchen VYTORIN 10-40 MG tablet TAKE 1 TABLET BY MOUTH DAILY.  . [DISCONTINUED] clonazePAM (KLONOPIN) 1 MG tablet Take 1/2  To 1 tab as needed for anxiety  . [DISCONTINUED] gabapentin (NEURONTIN) 600 MG tablet TAKE 1 TABLET (600 MG TOTAL) BY MOUTH 3 (THREE) TIMES DAILY.  . [DISCONTINUED] SEROQUEL XR 300 MG 24 hr tablet TAKE 1 TABLET (300 MG TOTAL) BY MOUTH AT BEDTIME.  . [DISCONTINUED] traZODone (DESYREL) 100 MG tablet TAKE 1 TABLET BY  MOUTH AT BEDTIME   No facility-administered encounter medications on file as of 04/22/2015.    Past Psychiatric History/Hospitalization(s): Patient has history of multiple psychiatric hospitalization due to suicidal thoughts, paranoia and hallucination.  She has one suicidal attempt when she cut her wrist.  She endorse mania, impulsivity, anger issues and severe depression.  Her last psychiatric hospitalization was 2014 at [facility].  In the past she had tried Zoloft, Risperdal, Zyprexa, lithium and Depakote.  She remember EPS with Risperdal.  Recently we tried Lamictal but she developed a rash.  She was also given generic Seroquel but she felt side effects with generic Seroquel. Anxiety: No Bipolar Disorder: Yes Depression: Yes Mania: Yes Psychosis: No Schizophrenia: No Personality Disorder: No Hospitalization for psychiatric illness: Yes History of Electroconvulsive Shock Therapy: No Prior Suicide Attempts: Yes  Physical Exam: Constitutional:  BP 129/86 mmHg  Pulse 78  Ht 5\' 3"  (1.6 m)  Wt 279 lb 12.8 oz (126.916 kg)  BMI 49.58 kg/m2 Pt refused  No results found for this or any previous visit (from the past 2160 hour(s)). General Appearance: alert, oriented, no acute distress  Musculoskeletal: Strength & Muscle Tone: within normal limits Gait & Station: normal Patient leans: N/A  Mental Status Examination/Evaluation: Patient is a middle-aged woman who is fully covered except her face due to her religious reason.  She is anxious but cooperative.  She maintained good eye contact.  Her speech is soft, fluent and coherent.  She described her mood anxious and nervous and her affect is constricted.  She denies any auditory or visual hallucination.  She denies any active or passive suicidal thoughts or homicidal thought.  There were no delusions, paranoia or any obsessive thoughts.  Her fund of knowledge is adequate.  She has no EPS, tremors or shakes.  There were no  flight of ideas or any loose association.  Her memory is good.  She is alert and oriented 3.  Her attention and concentration is fair.  Her insight judgment and impulse control is okay.  Established Problem, Stable/Improving (1), Review of Psycho-Social Stressors (1), Review and summation of old records (2), New Problem, with no additional work-up planned (3), Review of Last Therapy Session (1), Review of Medication Regimen & Side Effects (2) and Review of New Medication or Change in Dosage (2)  Assessment: Axis I; bipolar disorder with psychotic features, panic disorder without agoraphobia  Axis II; deferred  Axis III;  Past Medical History  Diagnosis Date  . ANEMIA-IRON DEFICIENCY 01/25/2009  . HYPERLIPIDEMIA 01/25/2009  . Morbid obesity ([place]) 01/25/2009  . [place] DISEASE, LUMBAR 01/25/2009  . PEPTIC ULCER DISEASE, HELICOBACTER PYLORI POSITIVE 10/03/2009  . HYPERTENSION 01/25/2009  . Chronic pain syndrome   . Allergic rhinitis   . Night sweats   . Contact lens/glasses fitting   . MANIC DEPRESSIVE ILLNESS 01/25/2009    pt is unsue if this is her  specifc dx  . Bipolar 1 disorder ([place])   . Complete tear of right rotator cuff 07/21/2013  . Anxiety     Plan Patient is still not interested in taking Tegretol .  She is very frustrated with weight gain .  I recommended to discontinue gabapentin and try Klonopin 0.5 mg twice a day to help her anxiety and panic attacks.  Continue Seroquel XR 300 mg at bedtime and trazodone 100 mg at bedtime.  Patient is scheduled to see her surgeon for evaluation for her lap band surgery follow-up.  Encouraged to watch her calorie intake and to regular exercise.  Patient has no EPS, tremors or shakes.  I will see her again in 3 months.  Discuss safety plan that anytime having active suicidal thoughts or homicidal thoughts and she need to call 911 or go to the local emergency room.  Mickell Birdwell T., MD  04/22/2015

## 2015-05-02 DIAGNOSIS — Z4651 Encounter for fitting and adjustment of gastric lap band: Secondary | ICD-10-CM | POA: Diagnosis not present

## 2015-05-23 DIAGNOSIS — Z9884 Bariatric surgery status: Secondary | ICD-10-CM | POA: Diagnosis not present

## 2015-05-31 DIAGNOSIS — I10 Essential (primary) hypertension: Secondary | ICD-10-CM | POA: Diagnosis not present

## 2015-05-31 DIAGNOSIS — Z9884 Bariatric surgery status: Secondary | ICD-10-CM | POA: Diagnosis not present

## 2015-06-01 ENCOUNTER — Other Ambulatory Visit (HOSPITAL_COMMUNITY): Payer: Self-pay | Admitting: Psychiatry

## 2015-06-12 ENCOUNTER — Other Ambulatory Visit (INDEPENDENT_AMBULATORY_CARE_PROVIDER_SITE_OTHER): Payer: Medicare Other

## 2015-06-12 ENCOUNTER — Encounter: Payer: Self-pay | Admitting: Internal Medicine

## 2015-06-12 ENCOUNTER — Ambulatory Visit (INDEPENDENT_AMBULATORY_CARE_PROVIDER_SITE_OTHER): Payer: Medicare Other | Admitting: Internal Medicine

## 2015-06-12 VITALS — BP 128/70 | HR 84 | Temp 98.7°F | Resp 16 | Ht 63.0 in | Wt 291.0 lb

## 2015-06-12 DIAGNOSIS — E785 Hyperlipidemia, unspecified: Secondary | ICD-10-CM | POA: Diagnosis not present

## 2015-06-12 DIAGNOSIS — I10 Essential (primary) hypertension: Secondary | ICD-10-CM | POA: Diagnosis not present

## 2015-06-12 DIAGNOSIS — R791 Abnormal coagulation profile: Secondary | ICD-10-CM

## 2015-06-12 DIAGNOSIS — R600 Localized edema: Secondary | ICD-10-CM

## 2015-06-12 DIAGNOSIS — R7309 Other abnormal glucose: Secondary | ICD-10-CM | POA: Diagnosis not present

## 2015-06-12 DIAGNOSIS — M549 Dorsalgia, unspecified: Secondary | ICD-10-CM

## 2015-06-12 DIAGNOSIS — D509 Iron deficiency anemia, unspecified: Secondary | ICD-10-CM | POA: Diagnosis not present

## 2015-06-12 DIAGNOSIS — R7989 Other specified abnormal findings of blood chemistry: Secondary | ICD-10-CM | POA: Insufficient documentation

## 2015-06-12 DIAGNOSIS — F3160 Bipolar disorder, current episode mixed, unspecified: Secondary | ICD-10-CM

## 2015-06-12 DIAGNOSIS — E559 Vitamin D deficiency, unspecified: Secondary | ICD-10-CM

## 2015-06-12 HISTORY — DX: Localized edema: R60.0

## 2015-06-12 LAB — COMPREHENSIVE METABOLIC PANEL
ALT: 25 U/L (ref 0–35)
AST: 17 U/L (ref 0–37)
Albumin: 3.6 g/dL (ref 3.5–5.2)
Alkaline Phosphatase: 75 U/L (ref 39–117)
BUN: 7 mg/dL (ref 6–23)
CO2: 28 mEq/L (ref 19–32)
Calcium: 8.9 mg/dL (ref 8.4–10.5)
Chloride: 106 mEq/L (ref 96–112)
Creatinine, Ser: 0.7 mg/dL (ref 0.40–1.20)
GFR: 113.47 mL/min (ref >60.00)
Glucose, Bld: 109 mg/dL — ABNORMAL HIGH (ref 70–99)
Potassium: 3.9 mEq/L (ref 3.5–5.1)
Sodium: 139 mEq/L (ref 135–145)
Total Bilirubin: 0.5 mg/dL (ref 0.2–1.2)
Total Protein: 7.3 g/dL (ref 6.0–8.3)

## 2015-06-12 LAB — CBC WITH DIFFERENTIAL/PLATELET
Basophils Absolute: 0 10*3/uL (ref 0.0–0.1)
Basophils Relative: 0.6 % (ref 0.0–3.0)
Eosinophils Absolute: 0.2 10*3/uL (ref 0.0–0.7)
Eosinophils Relative: 2 % (ref 0.0–5.0)
HCT: 40 % (ref 36.0–46.0)
Hemoglobin: 13.4 g/dL (ref 12.0–15.0)
Lymphocytes Relative: 23.2 % (ref 12.0–46.0)
Lymphs Abs: 1.9 10*3/uL (ref 0.7–4.0)
MCHC: 33.7 g/dL (ref 30.0–36.0)
MCV: 82.6 fl (ref 78.0–100.0)
Monocytes Absolute: 0.5 10*3/uL (ref 0.1–1.0)
Monocytes Relative: 5.4 % (ref 3.0–12.0)
Neutro Abs: 5.7 10*3/uL (ref 1.4–7.7)
Neutrophils Relative %: 68.8 % (ref 43.0–77.0)
Platelets: 242 10*3/uL (ref 150.0–400.0)
RBC: 4.84 Mil/uL (ref 3.87–5.11)
RDW: 15.3 % (ref 11.5–15.5)
WBC: 8.3 10*3/uL (ref 4.0–10.5)

## 2015-06-12 LAB — T4, FREE: Free T4: 0.69 ng/dL (ref 0.60–1.60)

## 2015-06-12 LAB — URINALYSIS, ROUTINE W REFLEX MICROSCOPIC
Bilirubin Urine: NEGATIVE
Hgb urine dipstick: NEGATIVE
Ketones, ur: NEGATIVE
Leukocytes, UA: NEGATIVE
Nitrite: NEGATIVE
RBC / HPF: NONE SEEN (ref 0–?)
Specific Gravity, Urine: 1.01 (ref 1.000–1.030)
Total Protein, Urine: NEGATIVE
Urine Glucose: NEGATIVE
Urobilinogen, UA: 0.2 (ref 0.0–1.0)
WBC, UA: NONE SEEN (ref 0–?)
pH: 6.5 (ref 5.0–8.0)

## 2015-06-12 LAB — LIPID PANEL
Cholesterol: 191 mg/dL (ref 0–200)
HDL: 48.1 mg/dL (ref >39.00)
LDL Cholesterol: 119 mg/dL — ABNORMAL HIGH (ref 0–99)
NonHDL: 142.86
Total CHOL/HDL Ratio: 4
Triglycerides: 117 mg/dL (ref 0.0–149.0)
VLDL: 23.4 mg/dL (ref 0.0–40.0)

## 2015-06-12 LAB — BRAIN NATRIURETIC PEPTIDE: Pro B Natriuretic peptide (BNP): 32 pg/mL (ref 0.0–100.0)

## 2015-06-12 LAB — CK: Total CK: 72 U/L (ref 7–177)

## 2015-06-12 LAB — TSH: TSH: 2.76 u[IU]/mL (ref 0.35–4.50)

## 2015-06-12 LAB — D-DIMER, QUANTITATIVE: D-Dimer, Quant: 0.89 ug/mL-FEU — ABNORMAL HIGH (ref 0.00–0.48)

## 2015-06-12 MED ORDER — LORCASERIN HCL ER 20 MG PO TB24: 1.0000 | ORAL_TABLET | Freq: Every day | ORAL | Status: DC

## 2015-06-12 MED ORDER — EZETIMIBE-SIMVASTATIN 10-40 MG PO TABS: 1.0000 | ORAL_TABLET | Freq: Every day | ORAL | Status: DC

## 2015-06-12 MED ORDER — GABAPENTIN 600 MG PO TABS: ORAL_TABLET | ORAL | Status: DC

## 2015-06-12 MED ORDER — VITAMIN D (ERGOCALCIFEROL) 1.25 MG (50000 UNIT) PO CAPS: 50000.0000 [IU] | ORAL_CAPSULE | ORAL | Status: DC

## 2015-06-12 NOTE — Progress Notes (Signed)
Pre visit review using our clinic review tool, if applicable. No additional management support is needed unless otherwise documented below in the visit note. 

## 2015-06-12 NOTE — Progress Notes (Signed)
Subjective:  Patient ID: Shannon [name], female    DOB: 1965-12-20  Age: 50 y.o. MRN: [id number]  CC: Edema   HPI DANALEE TARR presents for the complaint of swelling in both feet and ankles for about 1 week. She tells me the swelling goes away when she elevates her feet but then it returns throughout the day. She also complains of some weight gain and urinary frequency. She denies headache, blurred vision, chest pain, shortness of breath, dyspnea on exertion, or fatigue. She has had no urinary symptoms either. She is not taking NSAIDs for pain.  Outpatient Prescriptions Prior to Visit  Medication Sig Dispense Refill  . BYSTOLIC 10 MG tablet TAKE 1 TABLET (10 MG TOTAL) BY MOUTH DAILY. 90 tablet 3  . clonazePAM (KLONOPIN) 0.5 MG tablet Take 1 tablet (0.5 mg total) by mouth 2 (two) times daily. 60 tablet 2  . [facility] B-1 100 MG tablet TAKE 1 TABLET (100 MG TOTAL) BY MOUTH DAILY. 90 tablet 1  . FeAsp-B12-FA-C-DSS-SuccAc-Zn (FERIVA 21/7) 75-1 MG TABS Take 1 tablet by mouth daily. 28 tablet 11  . KLOR-CON M20 20 MEQ tablet     . Multiple Vitamin (MULTIVITAMIN) tablet Take 1 tablet by mouth daily.    Marland Kitchen NEXIUM 40 MG capsule Take 1 capsule (40 mg total) by mouth 2 (two) times daily before a meal. Brand name only 180 capsule 3  . SEROQUEL XR 300 MG 24 hr tablet TAKE 1 TABLET (300 MG TOTAL) BY MOUTH AT BEDTIME. 30 tablet 2  . traZODone (DESYREL) 100 MG tablet Take 1 tablet (100 mg total) by mouth at bedtime. 30 tablet 2  . Cholecalciferol (VITAMIN D3) 2000 UNITS capsule Take 1 capsule (2,000 Units total) by mouth daily. 100 capsule 3  . VYTORIN 10-40 MG tablet TAKE 1 TABLET BY MOUTH DAILY. 90 tablet 3   No facility-administered medications prior to visit.    ROS Review of Systems  Constitutional: Positive for unexpected weight change. Negative for fever, chills, diaphoresis, appetite change and fatigue.  HENT: Negative.   Eyes: Negative.  Negative for visual disturbance.  Respiratory:  Negative.  Negative for cough, choking, chest tightness, shortness of breath and stridor.   Cardiovascular: Positive for leg swelling. Negative for chest pain and palpitations.  Gastrointestinal: Negative.  Negative for vomiting, abdominal pain, diarrhea, constipation and blood in stool.  Endocrine: Positive for polyuria. Negative for polydipsia and polyphagia.  Genitourinary: Positive for frequency. Negative for dysuria, hematuria, flank pain, decreased urine volume and difficulty urinating.  Musculoskeletal: Positive for back pain. Negative for myalgias, joint swelling and arthralgias.  Skin: Negative.   Allergic/Immunologic: Negative.   Neurological: Negative.  Negative for dizziness, syncope, weakness and light-headedness.  Hematological: Negative.  Negative for adenopathy. Does not bruise/bleed easily.  Psychiatric/Behavioral: Negative.     Objective:  BP 128/70 mmHg  Pulse 84  Temp(Src) 98.7 F (37.1 C) (Oral)  Resp 16  Ht 5\' 3"  (1.6 m)  Wt 291 lb (131.997 kg)  BMI 51.56 kg/m2  SpO2 95%  BP Readings from Last 3 Encounters:  06/12/15 128/70  04/22/15 129/86  02/05/15 128/80    Wt Readings from Last 3 Encounters:  06/12/15 291 lb (131.997 kg)  04/22/15 279 lb 12.8 oz (126.916 kg)  02/05/15 262 lb 6.4 oz (119.024 kg)    Physical Exam  Constitutional: She is oriented to person, place, and time. No distress.  HENT:  Mouth/Throat: Oropharynx is clear and moist. No oropharyngeal exudate.  Eyes: Conjunctivae are normal. Right  eye exhibits no discharge. Left eye exhibits no discharge. No scleral icterus.  Neck: Normal range of motion. Neck supple. No JVD present. No tracheal deviation present. No thyromegaly present.  Cardiovascular: Normal rate, regular rhythm, normal heart sounds and intact distal pulses.  Exam reveals no gallop and no friction rub.   No murmur heard. Pulses:      Carotid pulses are 1+ on the right side, and 1+ on the left side.      Radial pulses are 1+  on the right side, and 1+ on the left side.       Femoral pulses are 1+ on the right side, and 1+ on the left side.      Popliteal pulses are 1+ on the right side, and 1+ on the left side.       Dorsalis pedis pulses are 1+ on the right side, and 1+ on the left side.       Posterior tibial pulses are 1+ on the right side, and 1+ on the left side.  EKG ---  Sinus  Rhythm  -Left axis.   ABNORMAL   Pulmonary/Chest: Effort normal and breath sounds normal. No stridor. No respiratory distress. She has no wheezes. She has no rales. She exhibits no tenderness.  Abdominal: Soft. Bowel sounds are normal. She exhibits no distension and no mass. There is no tenderness. There is no rebound and no guarding.  Musculoskeletal: Normal range of motion. She exhibits edema (1+ pitting edema in both ankles and feet). She exhibits no tenderness.  Lymphadenopathy:    She has no cervical adenopathy.  Neurological: She is oriented to person, place, and time.  Skin: Skin is warm and dry. No rash noted. She is not diaphoretic. No erythema. No pallor.  Psychiatric: She has a normal mood and affect. Her behavior is normal. Judgment and thought content normal.  Vitals reviewed.   Lab Results  Component Value Date   WBC 8.3 06/12/2015   HGB 13.4 06/12/2015   HCT 40.0 06/12/2015   PLT 242.0 06/12/2015   GLUCOSE 109* 06/12/2015   CHOL 191 06/12/2015   TRIG 117.0 06/12/2015   HDL 48.10 06/12/2015   LDLDIRECT 195.6 10/26/2012   LDLCALC 119* 06/12/2015   ALT 25 06/12/2015   AST 17 06/12/2015   NA 139 06/12/2015   K 3.9 06/12/2015   CL 106 06/12/2015   CREATININE 0.70 06/12/2015   BUN 7 06/12/2015   CO2 28 06/12/2015   TSH 2.76 06/12/2015   HGBA1C 5.6 06/11/2014    Dg Abd 1 View  01/02/2015  CLINICAL DATA:  Chronic vomiting.  Lap band. EXAM: ABDOMEN - 1 VIEW COMPARISON:  07/15/2010 FINDINGS: The lap band appears in good position with normal orientation, unchanged since the prior exam. There are no dilated  loops of large or small bowel. No visible free air or free fluid or osseous abnormality. IMPRESSION: Benign-appearing abdomen. Lap band appears in good position, unchanged. Electronically Signed   By: Lorriane Shire M.D.   On: 01/02/2015 16:40    Assessment & Plan:   Erilyn was seen today for edema.  Diagnoses and all orders for this visit:  Essential hypertension- her blood pressures well controlled, electrolytes and renal function are stable. -     Comprehensive metabolic panel; Future  Iron deficiency anemia- improvement noted -     CBC with Differential/Platelet; Future  Morbid obesity due to excess calories ([place])- she is motivated to lose weight so in addition to lifestyle modifications I have asked  her to start taking Belviq -     Lorcaserin HCl ER (BELVIQ XR) 20 MG TB24; Take 1 tablet by mouth daily.  Other abnormal glucose- she is prediabetic, will start Belviq to help her lose weight. -     Comprehensive metabolic panel; Future  Localized edema- the only remarkable finding on her labs is a very slightly elevated d-dimer, I will get an ultrasound of lower extremities to see if there is a deep venous thrombosis, the other labs are normal including thyroid functions/renal function/urine analysis/albumin level. I don't see any concern for nephrotic syndrome the most likely cause for the edema is obesity. -     Comprehensive metabolic panel; Future -     TSH; Future -     T4, free; Future -     Urinalysis, Routine w reflex microscopic (not at [facility]); Future -     D-dimer, quantitative (not at [facility]); Future -     Brain natriuretic peptide; Future -     EKG 12-Lead -     LE VENOUS; Future  Bilateral back pain, unspecified location -     gabapentin (NEURONTIN) 600 MG tablet; TAKE 1 TABLET (600 MG TOTAL) BY MOUTH 3 (THREE) TIMES DAILY.  Mixed bipolar I disorder (HCC) -     gabapentin (NEURONTIN) 600 MG tablet; TAKE 1 TABLET (600 MG TOTAL) BY MOUTH 3 (THREE) TIMES  DAILY.  Hyperlipidemia with target LDL less than 130- she is doing well on the statin is has achieved her LDL goal -     ezetimibe-simvastatin (VYTORIN) 10-40 MG tablet; Take 1 tablet by mouth daily. -     Lipid panel; Future -     CK; Future  Vitamin D deficiency -     Vitamin D, Ergocalciferol, (DRISDOL) 50000 units CAPS capsule; Take 1 capsule (50,000 Units total) by mouth every 7 (seven) days.  D-dimer, elevated- I've ordered a venous ultrasound the lower extremities to screen for deep venous thromboses. -     LE VENOUS; Future  I have discontinued Ms. Bielak's Vitamin D3. I have also changed her VYTORIN to ezetimibe-simvastatin. Additionally, I am having her start on Vitamin D (Ergocalciferol) and Lorcaserin HCl ER. Lastly, I am having her maintain her multivitamin, KLOR-CON M20, FERIVA 21/7, NEXIUM, [facility] B-1, BYSTOLIC, clonazePAM, SEROQUEL XR, traZODone, and gabapentin.  Meds ordered this encounter  Medications  . DISCONTD: gabapentin (NEURONTIN) 600 MG tablet    Sig: TAKE 1 TABLET (600 MG TOTAL) BY MOUTH 3 (THREE) TIMES DAILY.    Refill:  9  . ezetimibe-simvastatin (VYTORIN) 10-40 MG tablet    Sig: Take 1 tablet by mouth daily.    Dispense:  90 tablet    Refill:  3  . Vitamin D, Ergocalciferol, (DRISDOL) 50000 units CAPS capsule    Sig: Take 1 capsule (50,000 Units total) by mouth every 7 (seven) days.    Dispense:  12 capsule    Refill:  1  . gabapentin (NEURONTIN) 600 MG tablet    Sig: TAKE 1 TABLET (600 MG TOTAL) BY MOUTH 3 (THREE) TIMES DAILY.    Dispense:  90 tablet    Refill:  11  . Lorcaserin HCl ER (BELVIQ XR) 20 MG TB24    Sig: Take 1 tablet by mouth daily.    Dispense:  30 tablet    Refill:  5     Follow-up: Return in about 3 weeks (around 07/03/2015).  Scarlette Calico, MD

## 2015-06-12 NOTE — Patient Instructions (Signed)
Edema °Edema is an abnormal buildup of fluids in your body tissues. Edema is somewhat dependent on gravity to pull the fluid to the lowest place in your body. That makes the condition more common in the legs and thighs (lower extremities). Painless swelling of the feet and ankles is common and becomes more likely as you get older. It is also common in looser tissues, like around your eyes.  °When the affected area is squeezed, the fluid may move out of that spot and leave a dent for a few moments. This dent is called pitting.  °CAUSES  °There are many possible causes of edema. Eating too much salt and being on your feet or sitting for a long time can cause edema in your legs and ankles. Hot weather may make edema worse. Common medical causes of edema include: °· Heart failure. °· Liver disease. °· Kidney disease. °· Weak blood vessels in your legs. °· Cancer. °· An injury. °· Pregnancy. °· Some medications. °· Obesity.  °SYMPTOMS  °Edema is usually painless. Your skin may look swollen or shiny.  °DIAGNOSIS  °Your health care provider may be able to diagnose edema by asking about your medical history and doing a physical exam. You may need to have tests such as X-rays, an electrocardiogram, or blood tests to check for medical conditions that may cause edema.  °TREATMENT  °Edema treatment depends on the cause. If you have heart, liver, or kidney disease, you need the treatment appropriate for these conditions. General treatment may include: °· Elevation of the affected body part above the level of your heart. °· Compression of the affected body part. Pressure from elastic bandages or support stockings squeezes the tissues and forces fluid back into the blood vessels. This keeps fluid from entering the tissues. °· Restriction of fluid and salt intake. °· Use of a water pill (diuretic). These medications are appropriate only for some types of edema. They pull fluid out of your body and make you urinate more often. This  gets rid of fluid and reduces swelling, but diuretics can have side effects. Only use diuretics as directed by your health care provider. °HOME CARE INSTRUCTIONS  °· Keep the affected body part above the level of your heart when you are lying down.   °· Do not sit still or stand for prolonged periods.   °· Do not put anything directly under your knees when lying down. °· Do not wear constricting clothing or garters on your upper legs.   °· Exercise your legs to work the fluid back into your blood vessels. This may help the swelling go down.   °· Wear elastic bandages or support stockings to reduce ankle swelling as directed by your health care provider.   °· Eat a low-salt diet to reduce fluid if your health care provider recommends it.   °· Only take medicines as directed by your health care provider.  °SEEK MEDICAL CARE IF:  °· Your edema is not responding to treatment. °· You have heart, liver, or kidney disease and notice symptoms of edema. °· You have edema in your legs that does not improve after elevating them.   °· You have sudden and unexplained weight gain. °SEEK IMMEDIATE MEDICAL CARE IF:  °· You develop shortness of breath or chest pain.   °· You cannot breathe when you lie down. °· You develop pain, redness, or warmth in the swollen areas.   °· You have heart, liver, or kidney disease and suddenly get edema. °· You have a fever and your symptoms suddenly get worse. °MAKE SURE YOU:  °·   Understand these instructions. °· Will watch your condition. °· Will get help right away if you are not doing well or get worse. °  °This information is not intended to replace advice given to you by your health care provider. Make sure you discuss any questions you have with your health care provider. °  °Document Released: 02/09/2005 Document Revised: 03/02/2014 Document Reviewed: 12/02/2012 °Elsevier Interactive Patient Education ©2016 [employer] ° °

## 2015-06-13 ENCOUNTER — Ambulatory Visit (HOSPITAL_COMMUNITY)
Admission: RE | Admit: 2015-06-13 | Discharge: 2015-06-13 | Disposition: A | Discharge status: Home / Self Care | Payer: Medicare Other | Source: Ambulatory Visit | Attending: Family Medicine | Admitting: Family Medicine

## 2015-06-13 ENCOUNTER — Telehealth: Payer: Self-pay

## 2015-06-13 DIAGNOSIS — R600 Localized edema: Secondary | ICD-10-CM

## 2015-06-13 DIAGNOSIS — R791 Abnormal coagulation profile: Secondary | ICD-10-CM | POA: Diagnosis not present

## 2015-06-13 DIAGNOSIS — R7989 Other specified abnormal findings of blood chemistry: Secondary | ICD-10-CM

## 2015-06-13 NOTE — Progress Notes (Signed)
Preliminary results by tech - Venous Duplex Lower Ext. Completed. Negative for deep and superficial vein thrombosis in both legs.  Halston Kintz, BS, RDMS, RVT  

## 2015-06-13 NOTE — Telephone Encounter (Signed)
  Received called from scan stating pt was NEG for DVT this is also in EPIC routing to PCP

## 2015-06-14 ENCOUNTER — Telehealth: Payer: Self-pay | Admitting: Internal Medicine

## 2015-06-14 NOTE — Telephone Encounter (Signed)
1. The vytorin was sent on 4/19 2. Please advise Belviq 3. Msg on DVT was routed to PCP already if pt continues to have swelling she would need to be further evaluated, and EKG was also also done regarding her swelling   (PCP out of office and I am leaving for the day)

## 2015-06-14 NOTE — Telephone Encounter (Signed)
Patient called for the following:  - plan of care for the negative DVT results - she needs a cheaper alternative for the Lorcaserin HCl ER (BELVIQ XR) 20 MG TB24 [id number].  - she [facility] [address]

## 2015-06-15 MED ORDER — PHENTERMINE HCL 37.5 MG PO CAPS: 37.5000 mg | ORAL_CAPSULE | ORAL | Status: DC

## 2015-06-15 NOTE — Telephone Encounter (Signed)
1. Done 2. Change to phentermine 2 lose weight and elevated legs

## 2015-06-17 NOTE — Telephone Encounter (Signed)
[facility].../lmb

## 2015-06-25 ENCOUNTER — Encounter: Payer: Self-pay | Admitting: Internal Medicine

## 2015-06-25 ENCOUNTER — Ambulatory Visit (INDEPENDENT_AMBULATORY_CARE_PROVIDER_SITE_OTHER): Payer: Medicare Other | Admitting: Internal Medicine

## 2015-06-25 ENCOUNTER — Ambulatory Visit (INDEPENDENT_AMBULATORY_CARE_PROVIDER_SITE_OTHER)
Admission: RE | Admit: 2015-06-25 | Discharge: 2015-06-25 | Disposition: A | Discharge status: Home / Self Care | Payer: Medicare Other | Source: Ambulatory Visit | Attending: Internal Medicine | Admitting: Internal Medicine

## 2015-06-25 VITALS — BP 140/86 | HR 82 | Temp 98.0°F | Resp 20 | Wt 290.0 lb

## 2015-06-25 DIAGNOSIS — R609 Edema, unspecified: Secondary | ICD-10-CM

## 2015-06-25 DIAGNOSIS — R600 Localized edema: Secondary | ICD-10-CM

## 2015-06-25 DIAGNOSIS — I10 Essential (primary) hypertension: Secondary | ICD-10-CM | POA: Diagnosis not present

## 2015-06-25 DIAGNOSIS — R7309 Other abnormal glucose: Secondary | ICD-10-CM

## 2015-06-25 HISTORY — DX: Edema, unspecified: R60.9

## 2015-06-25 HISTORY — DX: Localized edema: R60.0

## 2015-06-25 NOTE — Patient Instructions (Signed)
.  Please continue all other medications as before  Please obtain compression stockings from [facility]  Please use leg elevation, low salt, no forced fluids, weight loss and the stockings to help the swelling  Please have the pharmacy call with any other refills you may need.  Please continue your efforts at being more active, low cholesterol diet, and weight control.  Please keep your appointments with your specialists as you may have planned  You will be contacted regarding the referral for: echocardiogram  Please go to the XRAY Department in the Basement (go straight as you get off the elevator) for the x-ray testing  You will be contacted by phone if any changes need to be made immediately.  Otherwise, you will receive a letter about your results with an explanation, but please check with MyChart first.  Please remember to sign up for MyChart if you have not done so, as this will be important to you in the future with finding out test results, communicating by private email, and scheduling acute appointments online when needed.

## 2015-06-25 NOTE — Progress Notes (Signed)
Subjective:    Patient ID: Shannon [name], female    DOB: November 04, 1965, 50 y.o.   MRN: [id number]  HPI  Here to f/u, c/o persistent LE edema, mild for several wks to mid legs, Pt denies chest pain, increased sob or doe, wheezing, orthopnea, PND, palpitations, dizziness or syncope. Pt denies new neurological symptoms such as new headache, or facial or extremity weakness or numbness   Pt denies polydipsia, polyuria, except does tend to drink plenty of fluids on a daily basis.  Has had several labs, LE venous doppler.  Pt denies new neurological symptoms such as new headache, or facial or extremity weakness or numbness  Pt denies fever, wt loss, night sweats, loss of appetite, or other constitutional symptoms Past Medical History  Diagnosis Date  . ANEMIA-IRON DEFICIENCY 01/25/2009  . HYPERLIPIDEMIA 01/25/2009  . Morbid obesity ([place]) 01/25/2009  . [place] DISEASE, LUMBAR 01/25/2009  . PEPTIC ULCER DISEASE, HELICOBACTER PYLORI POSITIVE 10/03/2009  . HYPERTENSION 01/25/2009  . Chronic pain syndrome   . Allergic rhinitis   . Night sweats   . Contact lens/glasses fitting   . MANIC DEPRESSIVE ILLNESS 01/25/2009    pt is unsue if this is her specifc dx  . Bipolar 1 disorder ([place])   . Complete tear of right rotator cuff 07/21/2013  . Anxiety    Past Surgical History  Procedure Laterality Date  . Bladder surgery      s/p with ?diverticulitis  . Abdominal hysterectomy    . Tubal ligation    . Hand tendon surgery  1991    s/p-Right-index and middle  . Laparoscopic gastric banding  07/14/10  . Shoulder arthroscopy Right 07/21/2013    Procedure: RIGHT ARTHROSCOPY SHOULDER DEBRIDMENT EXTENTSIVE,ARTHROSCOPIC REMOVE LOOSE FOREIGN BODY, BICEPS TENOLYSIS ;  Surgeon: Johnny Bridge, MD;  Location: [facility];  Service: Orthopedics;  Laterality: Right;    reports that she has been smoking Cigarettes.  She has a 5 pack-year smoking history. She has never used smokeless tobacco. She reports that she  does not drink alcohol or use illicit drugs. family history includes ADD / ADHD in her other and son; Alcohol abuse in her father; Asthma in her mother and sister; Cirrhosis in her father; Diabetes in her father, maternal grandmother, other, and other; Hypertension in her brother, father, mother, paternal aunt, and sister; Stroke in her father. There is no history of Suicidality. Allergies  Allergen Reactions  . Lamictal [Lamotrigine] Rash   Current Outpatient Prescriptions on File Prior to Visit  Medication Sig Dispense Refill  . BYSTOLIC 10 MG tablet TAKE 1 TABLET (10 MG TOTAL) BY MOUTH DAILY. 90 tablet 3  . clonazePAM (KLONOPIN) 0.5 MG tablet Take 1 tablet (0.5 mg total) by mouth 2 (two) times daily. 60 tablet 2  . [facility] B-1 100 MG tablet TAKE 1 TABLET (100 MG TOTAL) BY MOUTH DAILY. 90 tablet 1  . ezetimibe-simvastatin (VYTORIN) 10-40 MG tablet Take 1 tablet by mouth daily. 90 tablet 3  . FeAsp-B12-FA-C-DSS-SuccAc-Zn (FERIVA 21/7) 75-1 MG TABS Take 1 tablet by mouth daily. 28 tablet 11  . gabapentin (NEURONTIN) 600 MG tablet TAKE 1 TABLET (600 MG TOTAL) BY MOUTH 3 (THREE) TIMES DAILY. 90 tablet 11  . KLOR-CON M20 20 MEQ tablet     . Multiple Vitamin (MULTIVITAMIN) tablet Take 1 tablet by mouth daily.    Marland Kitchen NEXIUM 40 MG capsule Take 1 capsule (40 mg total) by mouth 2 (two) times daily before a meal. Brand name only 180  capsule 3  . phentermine 37.5 MG capsule Take 1 capsule (37.5 mg total) by mouth every morning. 30 capsule 2  . SEROQUEL XR 300 MG 24 hr tablet TAKE 1 TABLET (300 MG TOTAL) BY MOUTH AT BEDTIME. 30 tablet 2  . traZODone (DESYREL) 100 MG tablet Take 1 tablet (100 mg total) by mouth at bedtime. 30 tablet 2  . Vitamin D, Ergocalciferol, (DRISDOL) 50000 units CAPS capsule Take 1 capsule (50,000 Units total) by mouth every 7 (seven) days. 12 capsule 1   No current facility-administered medications on file prior to visit.    Review of Systems  Constitutional: Negative for unusual  diaphoresis or night sweats HENT: Negative for ear swelling or discharge Eyes: Negative for worsening visual haziness  Respiratory: Negative for choking and stridor.   Gastrointestinal: Negative for distension or worsening eructation Genitourinary: Negative for retention or change in urine volume.  Musculoskeletal: Negative for other MSK pain or swelling Skin: Negative for color change and worsening wound Neurological: Negative for tremors and numbness other than noted  Psychiatric/Behavioral: Negative for decreased concentration or agitation other than above  '     Objective:   Physical Exam BP 140/86 mmHg  Pulse 82  Temp(Src) 98 F (36.7 C) (Oral)  Resp 20  Wt 290 lb (131.543 kg)  SpO2 98% VS noted,  Constitutional: Pt appears in no apparent distress HENT: Head: NCAT.  Right Ear: External ear normal.  Left Ear: External ear normal.  Eyes: . Pupils are equal, round, and reactive to light. Conjunctivae and EOM are normal Neck: Normal range of motion. Neck supple.  Cardiovascular: Normal rate and regular rhythm.   Pulmonary/Chest: Effort normal and breath sounds without rales or wheezing.  Neurological: Pt is alert. Not confused , motor grossly intact Skin: Skin is warm. No rash, trace to 1+ bilat LE edema to knees Psychiatric: Pt behavior is normal. No agitation.      Assessment & Plan:

## 2015-06-25 NOTE — Assessment & Plan Note (Signed)
With recent extensive evaluation includig ecg, labs including BNP, and neg for LE DVT; suspect most likely related to venous insufficiency related to obesity and possibly forced po fluids recenlty as she was thinking this might help; cant completely r/o other, will check also cxr and echo, pt declines hct 12.5 b/c her mother warned her against fluid pills, for compression stockings, leg elevation, low salt, wt loss

## 2015-06-25 NOTE — Progress Notes (Signed)
Pre visit review using our clinic review tool, if applicable. No additional management support is needed unless otherwise documented below in the visit note. 

## 2015-06-30 NOTE — Assessment & Plan Note (Signed)
stable overall by history and exam, recent data reviewed with pt, and pt to continue medical treatment as before,  to f/u any worsening symptoms or concerns Lab Results  Component Value Date   HGBA1C 5.6 06/11/2014

## 2015-06-30 NOTE — Assessment & Plan Note (Signed)
stable overall by history and exam, recent data reviewed with pt, and pt to continue medical treatment as before,  to f/u any worsening symptoms or concerns BP Readings from Last 3 Encounters:  06/25/15 140/86  06/12/15 128/70  04/22/15 129/86

## 2015-07-03 ENCOUNTER — Ambulatory Visit: Payer: Self-pay | Admitting: Dietician

## 2015-07-12 ENCOUNTER — Encounter: Payer: Self-pay | Admitting: Internal Medicine

## 2015-07-12 ENCOUNTER — Ambulatory Visit (HOSPITAL_COMMUNITY): Payer: Medicare Other | Attending: Cardiology

## 2015-07-12 ENCOUNTER — Other Ambulatory Visit: Payer: Self-pay

## 2015-07-12 DIAGNOSIS — Z6841 Body Mass Index (BMI) 40.0 and over, adult: Secondary | ICD-10-CM | POA: Diagnosis not present

## 2015-07-12 DIAGNOSIS — E669 Obesity, unspecified: Secondary | ICD-10-CM | POA: Insufficient documentation

## 2015-07-12 DIAGNOSIS — E785 Hyperlipidemia, unspecified: Secondary | ICD-10-CM | POA: Diagnosis not present

## 2015-07-12 DIAGNOSIS — I10 Essential (primary) hypertension: Secondary | ICD-10-CM | POA: Insufficient documentation

## 2015-07-12 DIAGNOSIS — Z720 Tobacco use: Secondary | ICD-10-CM | POA: Diagnosis not present

## 2015-07-12 DIAGNOSIS — R609 Edema, unspecified: Secondary | ICD-10-CM | POA: Diagnosis not present

## 2015-07-23 ENCOUNTER — Encounter (HOSPITAL_COMMUNITY): Payer: Self-pay | Admitting: Psychiatry

## 2015-07-23 ENCOUNTER — Ambulatory Visit (INDEPENDENT_AMBULATORY_CARE_PROVIDER_SITE_OTHER): Payer: Medicare Other | Admitting: Psychiatry

## 2015-07-23 VITALS — BP 132/80 | HR 73 | Ht 63.0 in | Wt 296.6 lb

## 2015-07-23 DIAGNOSIS — F319 Bipolar disorder, unspecified: Secondary | ICD-10-CM | POA: Diagnosis not present

## 2015-07-23 MED ORDER — TRAZODONE HCL 100 MG PO TABS
100.0000 mg | ORAL_TABLET | Freq: Every day | ORAL | Status: DC
Start: 2015-07-23 — End: 2015-08-20

## 2015-07-23 MED ORDER — BUPROPION HCL ER (XL) 150 MG PO TB24: 150.0000 mg | ORAL_TABLET | Freq: Every day | ORAL | Status: DC

## 2015-07-23 MED ORDER — CLONAZEPAM 0.5 MG PO TABS
0.5000 mg | ORAL_TABLET | Freq: Three times a day (TID) | ORAL | Status: DC | PRN
Start: 2015-07-23 — End: 2015-08-20

## 2015-07-23 MED ORDER — SEROQUEL XR 300 MG PO TB24: ORAL_TABLET | ORAL | Status: DC

## 2015-07-23 NOTE — Progress Notes (Signed)
[facility] [phone] Progress Note  Shannon [name] [id number] 50 y.o.  07/23/2015 10:52 AM  Chief Complaint:  I'm feeling depressed.  I continued to gain weight.            History of Present Illness: Shannon [name] came for her follow-up appointment.  She gained another 15 pounds in past 3 months.  She is very frustrated and appears irritable with her weight gain.  She recently seen her surgeon and now she wants to do gastric sleeves surgery.  Patient believe she did not have a good outcome with LAP-BAND surgery.  She endorse that she rather be manic than depressed.  She admitted poor sleep, racing thoughts, sadness, lack of energy and feeling tired.  She also noticed some time hopelessness.  She is taking her medication but frustrated because trazodone not helping her sleep.  She is only sleeping 4 hours.  She denies any hallucination or any paranoia.  She also endorsed marital issues because she have not disclose very well her mental condition to her husband.  Sometimes she does not have enough support from her husband does not understand bipolar very well.  She recently seen her primary care physician who recommended to start phentermine but patient could not afford and she is not taking it.  She cut down her gabapentin and only taking one a day to try if she can lose some weight from gabapentin.  She did not lost weight and she has chronic pain.  We have tried lowering Seroquel but her symptoms come back.  She is taking Klonopin, trazodone and Seroquel.  She has no tremors, shakes, EPS.  She denies any suicidal thoughts or homicidal thought.  She denies drinking or using any illegal substances.  Patient seen her gastric surgeon and she scheduled to have surgery on October 10.  She need clearance from psychiatry.  Suicidal Ideation: No Plan Formed: No Patient has means to carry out plan: No  Homicidal Ideation: No Plan Formed: No Patient has means to carry out plan: No  Review of Systems   Constitutional: Positive for malaise/fatigue.       Weight gain  Eyes: Negative for blurred vision.  Respiratory: Negative for cough.   Cardiovascular: Negative for chest pain and palpitations.  Musculoskeletal: Positive for back pain.       Chronic back pain  Skin: Negative for itching and rash.  Neurological: Negative for dizziness and tingling.  Psychiatric/Behavioral: Negative for suicidal ideas, hallucinations and substance abuse. The patient is nervous/anxious.    Medical history: Patient has iron deficiency anemia, hyperlipidemia, morbid obesity, chronic pain, peptic ulcer disease, hypertension, vitamin D deficiency, allergy rhinitis.  Her primary care physician is Dr. Ronnald Ramp.  Outpatient Encounter Prescriptions as of 07/23/2015  Medication Sig  . buPROPion (WELLBUTRIN XL) 150 MG 24 hr tablet Take 1 tablet (150 mg total) by mouth daily.  Marland Kitchen BYSTOLIC 10 MG tablet TAKE 1 TABLET (10 MG TOTAL) BY MOUTH DAILY.  . clonazePAM (KLONOPIN) 0.5 MG tablet Take 1 tablet (0.5 mg total) by mouth 3 (three) times daily as needed for anxiety.  . CVS B-1 100 MG tablet TAKE 1 TABLET (100 MG TOTAL) BY MOUTH DAILY.  Marland Kitchen ezetimibe-simvastatin (VYTORIN) 10-40 MG tablet Take 1 tablet by mouth daily.  . FeAsp-B12-FA-C-DSS-SuccAc-Zn (FERIVA 21/7) 75-1 MG TABS Take 1 tablet by mouth daily.  Marland Kitchen gabapentin (NEURONTIN) 600 MG tablet TAKE 1 TABLET (600 MG TOTAL) BY MOUTH 3 (THREE) TIMES DAILY.  Marland Kitchen KLOR-CON M20 20 MEQ tablet   .  Multiple Vitamin (MULTIVITAMIN) tablet Take 1 tablet by mouth daily.  Marland Kitchen NEXIUM 40 MG capsule Take 1 capsule (40 mg total) by mouth 2 (two) times daily before a meal. Brand name only  . phentermine 37.5 MG capsule Take 1 capsule (37.5 mg total) by mouth every morning.  . SEROQUEL XR 300 MG 24 hr tablet TAKE 1 TABLET (300 MG TOTAL) BY MOUTH AT BEDTIME.  . traZODone (DESYREL) 100 MG tablet Take 1 tablet (100 mg total) by mouth at bedtime.  . Vitamin D, Ergocalciferol, (DRISDOL) 50000 units CAPS  capsule Take 1 capsule (50,000 Units total) by mouth every 7 (seven) days.  . [DISCONTINUED] clonazePAM (KLONOPIN) 0.5 MG tablet Take 1 tablet (0.5 mg total) by mouth 2 (two) times daily.  . [DISCONTINUED] SEROQUEL XR 300 MG 24 hr tablet TAKE 1 TABLET (300 MG TOTAL) BY MOUTH AT BEDTIME.  . [DISCONTINUED] traZODone (DESYREL) 100 MG tablet Take 1 tablet (100 mg total) by mouth at bedtime.   No facility-administered encounter medications on file as of 07/23/2015.    Past Psychiatric History/Hospitalization(s): Patient has history of multiple psychiatric hospitalization due to suicidal thoughts, paranoia and hallucination.  She has one suicidal attempt when she cut her wrist.  She endorse mania, impulsivity, anger issues and severe depression.  Her last psychiatric hospitalization was 2014 at [facility].  In the past she had tried Zoloft, Risperdal, Zyprexa, lithium and Depakote.  She remember EPS with Risperdal.  Recently we tried Lamictal but she developed a rash.  She was also given generic Seroquel but she felt side effects with generic Seroquel. Anxiety: No Bipolar Disorder: Yes Depression: Yes Mania: Yes Psychosis: No Schizophrenia: No Personality Disorder: No Hospitalization for psychiatric illness: Yes History of Electroconvulsive Shock Therapy: No Prior Suicide Attempts: Yes  Physical Exam: Constitutional:  BP 132/80 mmHg  Pulse 73  Ht 5\' 3"  (1.6 m)  Wt 296 lb 9.6 oz (134.537 kg)  BMI 52.55 kg/m2 Pt refused  Recent Results (from the past 2160 hour(s))  Lipid panel     Status: Abnormal   Collection Time: 06/12/15 12:09 PM  Result Value Ref Range   Cholesterol 191 0 - 200 mg/dL    Comment: ATP III Classification       Desirable:  < 200 mg/dL               Borderline High:  200 - 239 mg/dL          High:  > = 240 mg/dL   Triglycerides 117.0 0.0 - 149.0 mg/dL    Comment: Normal:  <150 mg/dLBorderline High:  150 - 199 mg/dL   HDL 48.10 >39.00 mg/dL   VLDL 23.4 0.0 -  40.0 mg/dL   LDL Cholesterol 119 (H) 0 - 99 mg/dL   Total CHOL/HDL Ratio 4     Comment:                Men          Women1/2 Average Risk     3.4          3.3Average Risk          5.0          4.42X Average Risk          9.6          7.13X Average Risk          15.0          11.0  NonHDL 142.86     Comment: NOTE:  Non-HDL goal should be 30 mg/dL higher than patient's LDL goal (i.e. LDL goal of < 70 mg/dL, would have non-HDL goal of < 100 mg/dL)  CK     Status: None   Collection Time: 06/12/15 12:09 PM  Result Value Ref Range   Total CK 72 7 - 177 U/L  Comprehensive metabolic panel     Status: Abnormal   Collection Time: 06/12/15 12:09 PM  Result Value Ref Range   Sodium 139 135 - 145 mEq/L   Potassium 3.9 3.5 - 5.1 mEq/L   Chloride 106 96 - 112 mEq/L   CO2 28 19 - 32 mEq/L   Glucose, Bld 109 (H) 70 - 99 mg/dL   BUN 7 6 - 23 mg/dL   Creatinine, Ser 0.70 0.40 - 1.20 mg/dL   Total Bilirubin 0.5 0.2 - 1.2 mg/dL   Alkaline Phosphatase 75 39 - 117 U/L   AST 17 0 - 37 U/L   ALT 25 0 - 35 U/L   Total Protein 7.3 6.0 - 8.3 g/dL   Albumin 3.6 3.5 - 5.2 g/dL   Calcium 8.9 8.4 - 10.5 mg/dL   GFR 113.47 >60.00 mL/min  CBC with Differential/Platelet     Status: None   Collection Time: 06/12/15 12:09 PM  Result Value Ref Range   WBC 8.3 4.0 - 10.5 K/uL   RBC 4.84 3.87 - 5.11 Mil/uL   Hemoglobin 13.4 12.0 - 15.0 g/dL   HCT 40.0 36.0 - 46.0 %   MCV 82.6 78.0 - 100.0 fl   MCHC 33.7 30.0 - 36.0 g/dL   RDW 15.3 11.5 - 15.5 %   Platelets 242.0 150.0 - 400.0 K/uL   Neutrophils Relative % 68.8 43.0 - 77.0 %   Lymphocytes Relative 23.2 12.0 - 46.0 %   Monocytes Relative 5.4 3.0 - 12.0 %   Eosinophils Relative 2.0 0.0 - 5.0 %   Basophils Relative 0.6 0.0 - 3.0 %   Neutro Abs 5.7 1.4 - 7.7 K/uL   Lymphs Abs 1.9 0.7 - 4.0 K/uL   Monocytes Absolute 0.5 0.1 - 1.0 K/uL   Eosinophils Absolute 0.2 0.0 - 0.7 K/uL   Basophils Absolute 0.0 0.0 - 0.1 K/uL  TSH     Status: None    Collection Time: 06/12/15 12:09 PM  Result Value Ref Range   TSH 2.76 0.35 - 4.50 uIU/mL  T4, free     Status: None   Collection Time: 06/12/15 12:09 PM  Result Value Ref Range   Free T4 0.69 0.60 - 1.60 ng/dL  Urinalysis, Routine w reflex microscopic (not at [facility])     Status: None   Collection Time: 06/12/15 12:09 PM  Result Value Ref Range   Color, Urine YELLOW Yellow;Lt. Yellow   APPearance CLEAR Clear   Specific Gravity, Urine 1.010 1.000-1.030   pH 6.5 5.0 - 8.0   Total Protein, Urine NEGATIVE Negative   Urine Glucose NEGATIVE Negative   Ketones, ur NEGATIVE Negative   Bilirubin Urine NEGATIVE Negative   Hgb urine dipstick NEGATIVE Negative   Urobilinogen, UA 0.2 0.0 - 1.0   Leukocytes, UA NEGATIVE Negative   Nitrite NEGATIVE Negative   WBC, UA none seen 0-2/hpf   RBC / HPF none seen 0-2/hpf   Squamous Epithelial / LPF Rare(0-4/hpf) Rare(0-4/hpf)  D-dimer, quantitative (not at [facility])     Status: Abnormal   Collection Time: 06/12/15 12:09 PM  Result Value Ref Range   D-Dimer,  Quant 0.89 (H) 0.00 - 0.48 ug/mL-FEU    Comment: At the inhouse established cutoff value of 0.48 ug/mL FEU, this methology has been documented in the literature to have a sensitivity and negative predictive value of at least 98-99%.  The test result should be correlated with an assessment of the clinical probability of DVT/VTE.   Brain natriuretic peptide     Status: None   Collection Time: 06/12/15 12:09 PM  Result Value Ref Range   Pro B Natriuretic peptide (BNP) 32.0 0.0 - 100.0 pg/mL   General Appearance: alert, oriented, no acute distress  Musculoskeletal: Strength & Muscle Tone: within normal limits Gait & Station: normal Patient leans: N/A  Mental Status Examination: Patient is a middle-aged woman who is fully covered except her face due to her religious reason.  She is anxious but cooperative.  She maintained fair eye contact.  Her speech is soft, fluent and coherent.  She described her  mood frustrated and her affect is mood appropriate.  She denies any auditory or visual hallucination.  She denies any active or passive suicidal thoughts or homicidal thought.  There were no delusions, paranoia or any obsessive thoughts.  Her fund of knowledge is adequate.  She has no EPS, tremors or shakes.  There were no flight of ideas or any loose association.  Her memory is good.  She is alert and oriented 3.  Her attention and concentration is fair.  Her insight judgment and impulse control is okay.  Established Problem, Stable/Improving (1), Review of Psycho-Social Stressors (1), Review and summation of old records (2), New Problem, with no additional work-up planned (3), Review of Last Therapy Session (1), Review of Medication Regimen & Side Effects (2) and Review of New Medication or Change in Dosage (2)  Assessment: Axis I; bipolar disorder with psychotic features, panic disorder without agoraphobia  Axis II; deferred  Axis III;  Past Medical History  Diagnosis Date  . ANEMIA-IRON DEFICIENCY 01/25/2009  . HYPERLIPIDEMIA 01/25/2009  . Morbid obesity ([place]) 01/25/2009  . [place] DISEASE, LUMBAR 01/25/2009  . PEPTIC ULCER DISEASE, HELICOBACTER PYLORI POSITIVE 10/03/2009  . HYPERTENSION 01/25/2009  . Chronic pain syndrome   . Allergic rhinitis   . Night sweats   . Contact lens/glasses fitting   . MANIC DEPRESSIVE ILLNESS 01/25/2009    pt is unsue if this is her specifc dx  . Bipolar 1 disorder ([place])   . Complete tear of right rotator cuff 07/21/2013  . Anxiety     Plan I have a long discussion with the patient about her diagnosis, long-term prognosis, medication and side effects.  I also reviewed blood work and collateral information from other physicians.  She is not interested to try any other antipsychotic .  She had tried Risperdal, Zyprexa but that also cause weight gain.  We tried to lower the Seroquel but she did not like the symptoms .  Though patient appears frustrated and mentioned  that she like to be manic than depressed but she realized that her mania always get worse and she admitted to the hospital.  She is frustrated with her weight.  She was given phentermine but she is not taking due to cost.  I recommended to try Wellbutrin XL 150 mg to help her depression.  I also recommended to try Klonopin 0.5 mg 3 times a day to help her anxiety and panic attacks.  It may help her sleep also.  Patient like to get gastric sleeves procedure because she has done some  research on it and she has been in discuss with the surgeon and both agreed to have the procedure.  However I strongly suggested that she need to watch her calorie intake, drinking regular exercise despite having the surgery.  I also encouraged to have her husband come next time so we can discuss her illness and prognosis with him and patient agreed.  She like to continue Seroquel XR 300 mg at bedtime.  I will continue trazodone 100 mg at bedtime.  I discuss medication side effects and in the beginning Wellbutrin may cause headaches or shakes but usually is a very safe medicine.  I recommended to call us back if she has any issues or any question.  Discuss safety plan that anytime having active suicidal thoughts or homicidal thoughts and she need to call 911 or go to the local emergency room.  Time spent 25 minutes and more than 50% of the time spent in psychoeducation, long-term prognosis, medication side effects and efficacy of the medication.   ARFEEN,SYED T., MD  07/23/2015

## 2015-07-24 ENCOUNTER — Telehealth: Payer: Self-pay | Admitting: Internal Medicine

## 2015-07-24 ENCOUNTER — Ambulatory Visit: Payer: Medicare Other | Admitting: Internal Medicine

## 2015-07-24 ENCOUNTER — Other Ambulatory Visit: Payer: Self-pay | Admitting: Internal Medicine

## 2015-07-24 DIAGNOSIS — R609 Edema, unspecified: Secondary | ICD-10-CM

## 2015-07-24 NOTE — Telephone Encounter (Signed)
Patient is requesting referral for swelling in her feet.

## 2015-07-31 ENCOUNTER — Other Ambulatory Visit: Payer: Self-pay | Admitting: Internal Medicine

## 2015-08-05 ENCOUNTER — Telehealth: Payer: Self-pay | Admitting: Internal Medicine

## 2015-08-05 NOTE — Telephone Encounter (Signed)
Patient called regarding the referral that we placed to vascular and vein specialists. She states that they booked an appointment for her on 10/15/2015. They state that this is the soonest that they can get her in. They also advised her that if we need her to be seen sooner, then we should contact them. The patient is asking that we contact [facility] to get her a sooner appointment, as her situation continued to digress.

## 2015-08-07 NOTE — Telephone Encounter (Signed)
Left msg at [facility] to see if we can get her in sooner.

## 2015-08-07 NOTE — Telephone Encounter (Signed)
Was able to get patient in on 7/17 at 3:00pm for [name] and 3:45 with Dr. Trula Slade Left msg on pt's vm to call back to give her information

## 2015-08-12 ENCOUNTER — Other Ambulatory Visit: Payer: Self-pay | Admitting: Internal Medicine

## 2015-08-12 NOTE — Telephone Encounter (Signed)
Patient called back. I informed her of the app. If you have anything to add please give her a call back. Thank you.

## 2015-08-20 ENCOUNTER — Telehealth (HOSPITAL_COMMUNITY): Payer: Self-pay

## 2015-08-20 ENCOUNTER — Ambulatory Visit (HOSPITAL_COMMUNITY): Payer: Self-pay | Admitting: Psychiatry

## 2015-08-20 DIAGNOSIS — F319 Bipolar disorder, unspecified: Secondary | ICD-10-CM

## 2015-08-20 MED ORDER — SEROQUEL XR 300 MG PO TB24
ORAL_TABLET | ORAL | Status: DC
Start: 2015-08-20 — End: 2015-10-02

## 2015-08-20 MED ORDER — BUPROPION HCL ER (XL) 150 MG PO TB24: 150.0000 mg | ORAL_TABLET | Freq: Every day | ORAL | Status: DC

## 2015-08-20 MED ORDER — CLONAZEPAM 0.5 MG PO TABS: 0.5000 mg | ORAL_TABLET | Freq: Three times a day (TID) | ORAL | Status: DC | PRN

## 2015-08-20 MED ORDER — TRAZODONE HCL 100 MG PO TABS: 100.0000 mg | ORAL_TABLET | Freq: Every day | ORAL | Status: DC

## 2015-08-20 NOTE — Telephone Encounter (Signed)
Dr. Adele Schilder spoke with me about this patient and approved an order for one month on all four medications. These were sent to the pharmacy, the Klonopin was called in. I called patient and left a voicemail letting her know that this was done.

## 2015-08-20 NOTE — Telephone Encounter (Signed)
Patient calling because she had to cancel her appointment due to the swelling in her feet. She has an appointment with a foot specialist coming up and has rescheduled with you for 7/24. Patient will need refills on Trazodone, wellbutrin, Klonopin, and Seroquel. Please review and advise, thank you

## 2015-08-21 ENCOUNTER — Other Ambulatory Visit: Payer: Self-pay

## 2015-08-21 DIAGNOSIS — R609 Edema, unspecified: Secondary | ICD-10-CM

## 2015-09-05 ENCOUNTER — Encounter: Payer: Self-pay | Admitting: Surgery

## 2015-09-09 ENCOUNTER — Ambulatory Visit (INDEPENDENT_AMBULATORY_CARE_PROVIDER_SITE_OTHER): Payer: Medicare Other | Admitting: Surgery

## 2015-09-09 ENCOUNTER — Ambulatory Visit (HOSPITAL_COMMUNITY)
Admission: RE | Admit: 2015-09-09 | Discharge: 2015-09-09 | Disposition: A | Discharge status: Home / Self Care | Payer: Medicare Other | Source: Ambulatory Visit | Attending: Surgery | Admitting: Surgery

## 2015-09-09 ENCOUNTER — Encounter: Payer: Self-pay | Admitting: Surgery

## 2015-09-09 VITALS — BP 162/105 | HR 81 | Temp 97.0°F | Resp 20 | Ht 63.0 in | Wt 301.0 lb

## 2015-09-09 DIAGNOSIS — I10 Essential (primary) hypertension: Secondary | ICD-10-CM | POA: Insufficient documentation

## 2015-09-09 DIAGNOSIS — F319 Bipolar disorder, unspecified: Secondary | ICD-10-CM | POA: Diagnosis not present

## 2015-09-09 DIAGNOSIS — E785 Hyperlipidemia, unspecified: Secondary | ICD-10-CM | POA: Diagnosis not present

## 2015-09-09 DIAGNOSIS — I872 Venous insufficiency (chronic) (peripheral): Secondary | ICD-10-CM

## 2015-09-09 DIAGNOSIS — I8393 Asymptomatic varicose veins of bilateral lower extremities: Secondary | ICD-10-CM | POA: Diagnosis not present

## 2015-09-09 DIAGNOSIS — R609 Edema, unspecified: Secondary | ICD-10-CM | POA: Diagnosis present

## 2015-09-09 NOTE — Progress Notes (Signed)
[facility] [place]  Patient name: Shannon [name] MRN: [id number] DOB: [birth year] Sex: female  REASON FOR CONSULT: Bilateral leg swelling  HPI: Shannon [name] is a 50 y.o. female, who presents with a 4 month history of worsening bilateral leg swelling. The patient states that her swelling is worse at the end of the day. She has elevated her legs in the past. Recently however, she states that elevating her legs does not improve her swelling. She has worn compression stockings in the past. She has noticed increased itching to her lower legs and darkening of her skin bilaterally. She denies any open wounds. She denies a history of DVT. She has no known family history of vein issues. She states that her swelling is worse after wearing them. She also complains of bilateral stabbing pain to the balls of her feet first thing in the morning.  Her past medical history includes hypertension managed on a beta blocker. Hyperlipidemia managed on a statin. She is morbidly obese and has undergone lap band surgery.  Past Medical History  Diagnosis Date  . ANEMIA-IRON DEFICIENCY 01/25/2009  . HYPERLIPIDEMIA 01/25/2009  . Morbid obesity ([place]) 01/25/2009  . [place] DISEASE, LUMBAR 01/25/2009  . PEPTIC ULCER DISEASE, HELICOBACTER PYLORI POSITIVE 10/03/2009  . HYPERTENSION 01/25/2009  . Chronic pain syndrome   . Allergic rhinitis   . Night sweats   . Contact lens/glasses fitting   . MANIC DEPRESSIVE ILLNESS 01/25/2009    pt is unsue if this is her specifc dx  . Bipolar 1 disorder ([place])   . Complete tear of right rotator cuff 07/21/2013  . Anxiety     Family History  Problem Relation Age of Onset  . Hypertension Mother   . Asthma Mother   . Stroke Father   . Cirrhosis Father     ETOH  . Hypertension Father   . Diabetes Father   . Alcohol abuse Father   . Hypertension Sister   . Asthma Sister   . ADD / ADHD Son   . Hypertension Paternal Aunt   . Diabetes Maternal Grandmother     . ADD / ADHD Other     3 nephews, also emotional issues  . Diabetes Other     Uncle  . Diabetes Other     Aunt  . Hypertension Brother   . Suicidality Neg Hx     SOCIAL HISTORY: Social History   Social History  . Marital Status: Divorced    Spouse Name: N/A  . Number of Children: 2  . Years of Education: N/A   Occupational History  . STUDENT    Social History Main Topics  . Smoking status: Current Every Day Smoker -- 0.25 packs/day for 20 years    Types: Cigarettes  . Smokeless tobacco: Never Used  . Alcohol Use: No  . Drug Use: No  . Sexual Activity: Not Currently   Other Topics Concern  . Not on file   Social History Narrative   Moved from [place], then [place] to [place] 2009   2 children-1 boy, 1 girl   Disabled-bipolar   Daily Caffeine Use-1 cup/day    Allergies  Allergen Reactions  . Lamictal [Lamotrigine] Rash    Current Outpatient Prescriptions  Medication Sig Dispense Refill  . buPROPion (WELLBUTRIN XL) 150 MG 24 hr tablet Take 1 tablet (150 mg total) by mouth daily. 30 tablet 0  . BYSTOLIC 10 MG tablet TAKE 1 TABLET (10 MG TOTAL) BY MOUTH DAILY. [place]  tablet 3  . clonazePAM (KLONOPIN) 0.5 MG tablet Take 1 tablet (0.5 mg total) by mouth 3 (three) times daily as needed for anxiety. 90 tablet 0  . CVS B-1 100 MG tablet TAKE 1 TABLET (100 MG TOTAL) BY MOUTH DAILY. 100 tablet 1  . ezetimibe-simvastatin (VYTORIN) 10-40 MG tablet Take 1 tablet by mouth daily. 90 tablet 3  . gabapentin (NEURONTIN) 600 MG tablet TAKE 1 TABLET (600 MG TOTAL) BY MOUTH 3 (THREE) TIMES DAILY. 90 tablet 11  . KLOR-CON M20 20 MEQ tablet     . NEXIUM 40 MG capsule Take 1 capsule (40 mg total) by mouth 2 (two) times daily before a meal. Brand name only 180 capsule 3  . SEROQUEL XR 300 MG 24 hr tablet TAKE 1 TABLET (300 MG TOTAL) BY MOUTH AT BEDTIME. 30 tablet 0  . traZODone (DESYREL) 100 MG tablet Take 1 tablet (100 mg total) by mouth at bedtime. 30 tablet 0  . Vitamin D,  Ergocalciferol, (DRISDOL) 50000 units CAPS capsule TAKE ONE CAPSULE BY MOUTH EVERY 7 DAYS 12 capsule 0  . FeAsp-B12-FA-C-DSS-SuccAc-Zn (FERIVA 21/7) 75-1 MG TABS Take 1 tablet by mouth daily. (Patient not taking: Reported on 09/09/2015) 28 tablet 11  . Multiple Vitamin (MULTIVITAMIN) tablet Take 1 tablet by mouth daily. Reported on 09/09/2015    . phentermine 37.5 MG capsule Take 1 capsule (37.5 mg total) by mouth every morning. (Patient not taking: Reported on 09/09/2015) 30 capsule 2   No current facility-administered medications for this visit.    REVIEW OF SYSTEMS:  [X]  denotes positive finding, [ ]  denotes negative finding Cardiac  Comments:  Chest pain or chest pressure:    Shortness of breath upon exertion:    Short of breath when lying flat:    Irregular heart rhythm:        Vascular    Pain in calf, thigh, or hip brought on by ambulation:    Pain in feet at night that wakes you up from your sleep:  x   Blood clot in your veins:    Leg swelling:  x       Pulmonary    Oxygen at home:    Productive cough:     Wheezing:         Neurologic    Sudden weakness in arms or legs:     Sudden numbness in arms or legs:     Sudden onset of difficulty speaking or slurred speech:    Temporary loss of vision in one eye:     Problems with dizziness:         Gastrointestinal    Blood in stool:     Vomited blood:         Genitourinary    Burning when urinating:     Blood in urine:        Psychiatric    Major depression:         Hematologic    Bleeding problems:    Problems with blood clotting too easily:        Skin    Rashes or ulcers:        Constitutional    Fever or chills:      PHYSICAL EXAM: Filed Vitals:   09/09/15 1608 09/09/15 1613  BP: 159/103 162/105  Pulse: 81   Temp: 97 F (36.1 C)   TempSrc: Oral   Resp: 20   Height: 5\' 3"  (1.6 m)   Weight: 301 lb (136.533 kg)  SpO2: 88%     GENERAL: The patient is a well-nourished morbidly obese female, in no  acute distress. The vital signs are documented above. CARDIAC: There is a regular rate and rhythm. No carotid bruits. VASCULAR: 2+ dorsalis pedis pulses bilaterally. 2+ pitting edema bilateral lower extremities. PULMONARY: There is good air exchange bilaterally without wheezing or rales. ABDOMEN: Soft and non-tender with normal pitched bowel sounds.  MUSCULOSKELETAL: There are no major deformities or cyanosis. NEUROLOGIC: No focal weakness or paresthesias are detected. SKIN: Some darkening of lower legs bilaterally. No open wounds. PSYCHIATRIC: The patient has a normal affect.  DATA:  Lower extremity venous reflux exam 09/09/2015  There is no evidence of DVT bilaterally. Lymph nodes are noted in the bilateral thigh area. There is some deep venous reflux on the right. No superficial venous reflux on the right. On the left there is no evidence of deep venous reflux. There is reflux in the left saphenofemoral junction.  MEDICAL ISSUES: Bilateral lower extremity edema  The patient's venous reflux exam reveals mostly competent veins. Her swelling is not likely related to venous insufficiency. Discussed symptom management including leg elevation and compression therapy. Also discussed weight loss. We'll also refer her to the lymphedema clinic. She will follow up on an as-needed basis.   Virgina Jock, PA-C [facility] [place]     I agree with the above.  I have seen and evaluated the patient.  Based on her ultrasound and physical exam findings, I feel that she is most likely suffering from lymphedema.  She denies any history of trauma.  She has tried for compression stockings but has not had good success.  I have encouraged her use lotion to keep her legs from getting too dry and to try and wear compression stockings as much as possible.  She was given information about where to obtain the stockings.  I have also encouraged her to keep her legs elevated.  I'm  also referring her to lymphedema therapy.  She is scheduled to undergo additional weight loss surgery next month.  I also told her that weight loss should help alleviate some of her symptoms.  She will contact me if she has any other questions or concerns  Wells Brabham

## 2015-09-16 ENCOUNTER — Ambulatory Visit (HOSPITAL_COMMUNITY): Payer: Self-pay | Admitting: Psychiatry

## 2015-09-30 ENCOUNTER — Other Ambulatory Visit (HOSPITAL_COMMUNITY): Payer: Self-pay | Admitting: Psychiatry

## 2015-09-30 DIAGNOSIS — F319 Bipolar disorder, unspecified: Secondary | ICD-10-CM

## 2015-10-02 ENCOUNTER — Other Ambulatory Visit (HOSPITAL_COMMUNITY): Payer: Self-pay

## 2015-10-02 DIAGNOSIS — F319 Bipolar disorder, unspecified: Secondary | ICD-10-CM

## 2015-10-02 MED ORDER — CLONAZEPAM 0.5 MG PO TABS: 0.5000 mg | ORAL_TABLET | Freq: Three times a day (TID) | ORAL | 0 refills | Status: DC | PRN

## 2015-10-02 MED ORDER — SEROQUEL XR 300 MG PO TB24: ORAL_TABLET | ORAL | 0 refills | Status: DC

## 2015-10-02 MED ORDER — BUPROPION HCL ER (XL) 150 MG PO TB24: 150.0000 mg | ORAL_TABLET | Freq: Every day | ORAL | 0 refills | Status: DC

## 2015-10-02 MED ORDER — TRAZODONE HCL 100 MG PO TABS: 100.0000 mg | ORAL_TABLET | Freq: Every day | ORAL | 0 refills | Status: DC

## 2015-10-15 ENCOUNTER — Encounter (HOSPITAL_COMMUNITY): Payer: Self-pay

## 2015-10-15 ENCOUNTER — Encounter: Payer: Self-pay | Admitting: Vascular Surgery

## 2015-10-28 ENCOUNTER — Other Ambulatory Visit (HOSPITAL_COMMUNITY): Payer: Self-pay | Admitting: Psychiatry

## 2015-10-28 DIAGNOSIS — F319 Bipolar disorder, unspecified: Secondary | ICD-10-CM

## 2015-10-31 ENCOUNTER — Other Ambulatory Visit (HOSPITAL_COMMUNITY): Payer: Self-pay

## 2015-10-31 DIAGNOSIS — F319 Bipolar disorder, unspecified: Secondary | ICD-10-CM

## 2015-10-31 MED ORDER — CLONAZEPAM 0.5 MG PO TABS: 0.5000 mg | ORAL_TABLET | Freq: Three times a day (TID) | ORAL | 0 refills | Status: DC | PRN

## 2015-10-31 MED ORDER — TRAZODONE HCL 100 MG PO TABS: 100.0000 mg | ORAL_TABLET | Freq: Every day | ORAL | 0 refills | Status: DC

## 2015-10-31 MED ORDER — BUPROPION HCL ER (XL) 150 MG PO TB24: 150.0000 mg | ORAL_TABLET | Freq: Every day | ORAL | 0 refills | Status: DC

## 2015-11-05 ENCOUNTER — Encounter: Payer: Medicare Other | Attending: Surgery | Admitting: Dietician

## 2015-11-05 DIAGNOSIS — Z713 Dietary counseling and surveillance: Secondary | ICD-10-CM | POA: Insufficient documentation

## 2015-11-05 DIAGNOSIS — Z01818 Encounter for other preprocedural examination: Secondary | ICD-10-CM | POA: Insufficient documentation

## 2015-11-05 NOTE — Progress Notes (Signed)
  Pre-Op Assessment Visit:  Pre-Operative Sleeve Gastrectomy Surgery  Medical Nutrition Therapy:  Appt start time: 0800   End time:  0845.  Patient was seen on 11/05/2015 for Pre-Operative Nutrition Assessment.  Patient is interested in a having lap band removed and converting to sleeve gastrectomy. Has had all fluid removed from band is gaining weight at a rapid rate. Reports that weight one year ago was 260 lbs and weight on 11/05/2015 was 307 lbs. Weight was 287 lbs on 05/31/2015 at [place]. She tends to not eat all day then eats mostly ice cream and cookie dough in the evening instead of meals. States that she does not eat these food because of problems with her band. Admits to emotional eating during times of boredom.   Per Jetty Peeks, RD, on 09/03/2010 patient was 8 weeks post op from lap band surgery patient was eating 3 bowls of cereal per day and bread. On 10/21/2010 patient was 16 weeks post op and reported several episodes of binge eating "half gallon of ice cream w/oreo cookies crushed in it". Also on 10/21/2010 patient is quoted as stating "a part of me is frustrated and part of me has given up on this weight loss". Patient did not follow up for appointments with dietitians at [facility] since that date. She did not show up for appointment on 12/12/2012 or 10/15/2014 and canceled an appointment on 07/03/2015.  Discussed the importance of following a low carb/high protein diet in order to have success after having sleeve gastrectomy and explained that she likely will still be able to tolerate ice cream and sweets after surgery. Recommended that she make her diet as close to the recommended diet before surgery and if it is too hard then she should delay surgery until she feels ready to make these changes. Patient is scheduled for a follow up appointment on 11/26/2015.  Preferred Learning Style:   Auditory  Visual  Hands on  No preference indicated   Learning Readiness:   Not ready  Handouts given  during visit include:  Pre-Op Goals Bariatric Surgery Protein Shakes   During the appointment today the following Pre-Op Goals were reviewed with the patient: Maintain or lose weight as instructed by your surgeon Make healthy food choices Begin to limit portion sizes Limited concentrated sugars and fried foods Keep fat/sugar in the single digits per serving on   food labels Practice CHEWING your food  (aim for 30 chews per bite or until applesauce consistency) Practice not drinking 15 minutes before, during, and 30 minutes after each meal/snack Avoid all carbonated beverages  Avoid/limit caffeinated beverages  Avoid all sugar-sweetened beverages Consume 3 meals per day; eat every 3-5 hours Make a list of non-food related activities Aim for 64-100 ounces of FLUID daily  Aim for at least 60-80 grams of PROTEIN daily Look for a liquid protein source that contain ?15 g protein and ?5 g carbohydrate  (ex: shakes, drinks, shots)  Demonstrated degree of understanding via:  Teach Back  Teaching Method Utilized:  Visual Auditory Hands on  Barriers to learning/adherence to lifestyle change: emotional eating, unstructured meals, inappropriate meal choices  [facility] to [facility] to enroll in Pre-Op and Post-Op Nutrition Education when surgery date is scheduled.

## 2015-11-05 NOTE — Patient Instructions (Signed)
Work on eating 3 x day protein food and vegetables (protein shake, meat, chicken, fish, cheese, yogurt, eggs). Remove sweets from your home.  Follow Pre-Op Goals Try Protein Shakes Call [facility] at [phone] when surgery is scheduled to enroll in Pre-Op Class  Things to remember:  Please always be honest with [name]. We want to support you!  If you have any questions or concerns in between appointments, please call or email Ferol Luz, or Margarita Grizzle.  The diet after surgery will be high protein and low in carbohydrate.  Vitamins and calcium need to be taken for the rest of your life.  Feel free to include support people in any classes or appointments.

## 2015-11-07 ENCOUNTER — Ambulatory Visit (HOSPITAL_COMMUNITY): Payer: Self-pay | Admitting: Psychiatry

## 2015-11-07 ENCOUNTER — Encounter: Payer: Self-pay | Admitting: Dietician

## 2015-11-14 ENCOUNTER — Other Ambulatory Visit: Payer: Self-pay | Admitting: Internal Medicine

## 2015-11-26 ENCOUNTER — Encounter: Payer: Medicare Other | Attending: Surgery | Admitting: Dietician

## 2015-11-26 DIAGNOSIS — Z713 Dietary counseling and surveillance: Secondary | ICD-10-CM | POA: Diagnosis present

## 2015-11-26 DIAGNOSIS — Z01818 Encounter for other preprocedural examination: Secondary | ICD-10-CM | POA: Insufficient documentation

## 2015-11-26 NOTE — Patient Instructions (Addendum)
Start taking a multivitamin with iron and 3000 IU of vitamin D. Continue having 3 meals per day. Continue exercising. Continue working on chewing 20-30 times per bite. Continue working on not drinking during meals and waiting 30 minutes eating before drinking.

## 2015-11-26 NOTE — Progress Notes (Signed)
  6 Months Supervised Weight Loss Visit:   Pre-Operative sleeve gastrectomy Surgery  Medical Nutrition Therapy:  Appt start time: 0830 end time:  0850.  Primary concerns today: Supervised Weight Loss Visit. Returns with a 6 lb weight loss since last month. Taking Phentermine. Not hungry at lunch but still having a snack.  Really trying to follow recommendations. as been having protein shakes ([place]), eating protein with vegetables, and doing some exercise! Has more energy and feeling much better. Protein shakes are constipating her (but ok with 1 per day). Taking some enemas.   Has removed sweets from home!   Working on chewing well and not drinking during meals.   Weight: 301.5 lbs BMI: 53.4  Preferred Learning Style:   No preference indicated   Learning Readiness:   Ready  24-hr recall: B (AM): Protein shake or banana  Snk (AM): none L (PM): fruit, nuts or cheese  Snk (PM): none D (PM): tilapia or other fish with vegetables and corn/peas Snk (PM): yogurt or banana or apples   Beverages: 2-3 bottles water, 1 glass tea with sugar, 16 oz whole milk, 1 protein shake per day    Medications: see list   Recent physical activity:  Stretching, getting on ground and getting back up, 10-15 minutes of exercise  Progress Towards Goal(s):  In progress.  Handouts given during visit include:  Vitamin paperwork   Nutritional Diagnosis:  [place]-3.3 Obesity related to past poor dietary habits and physical inactivity as evidenced by patient attending supervised weight loss for insurance approval of bariatric surgery.    Intervention:  Nutrition counseling provided. Plan: Start taking a multivitamin with iron and 3000 IU of vitamin D. Continue having 3 meals per day. Continue exercising. Continue working on chewing 20-30 times per bite. Continue working on not drinking during meals and waiting 30 minutes eating before drinking.   Teaching Method Utilized:  Visual Auditory Hands  on  Barriers to learning/adherence to lifestyle change: recent history of unstructured meal pattern  Demonstrated degree of understanding via:  Teach Back   Monitoring/Evaluation:  Dietary intake, exercise, and body weight. Follow up in 1 months for 6 month supervised weight loss visit.

## 2015-11-29 ENCOUNTER — Other Ambulatory Visit (HOSPITAL_COMMUNITY): Payer: Self-pay | Admitting: Psychiatry

## 2015-11-29 DIAGNOSIS — F319 Bipolar disorder, unspecified: Secondary | ICD-10-CM

## 2015-12-04 ENCOUNTER — Other Ambulatory Visit (HOSPITAL_COMMUNITY): Payer: Self-pay

## 2015-12-04 DIAGNOSIS — F319 Bipolar disorder, unspecified: Secondary | ICD-10-CM

## 2015-12-04 MED ORDER — TRAZODONE HCL 100 MG PO TABS: 100.0000 mg | ORAL_TABLET | Freq: Every day | ORAL | 0 refills | Status: DC

## 2015-12-04 MED ORDER — CLONAZEPAM 0.5 MG PO TABS: 0.5000 mg | ORAL_TABLET | Freq: Three times a day (TID) | ORAL | 0 refills | Status: DC | PRN

## 2015-12-04 MED ORDER — QUETIAPINE FUMARATE ER 300 MG PO TB24: 300.0000 mg | ORAL_TABLET | Freq: Every day | ORAL | 0 refills | Status: DC

## 2015-12-25 ENCOUNTER — Encounter: Payer: Medicare Other | Attending: Surgery | Admitting: Dietician

## 2015-12-25 DIAGNOSIS — Z01818 Encounter for other preprocedural examination: Secondary | ICD-10-CM | POA: Diagnosis not present

## 2015-12-25 DIAGNOSIS — Z713 Dietary counseling and surveillance: Secondary | ICD-10-CM | POA: Diagnosis present

## 2015-12-25 NOTE — Progress Notes (Signed)
  6 Months Supervised Weight Loss Visit:   Pre-Operative sleeve gastrectomy Surgery  Medical Nutrition Therapy:  Appt start time: 1100 end time:  1115  Primary concerns today: Supervised Weight Loss Visit. Returns with a 1 lb weight gain since last month. Not too many changes to her diet. Disappointed that she did not lose weight. Eating more bananas and fruit (substitute for other sweets). Eating about 6 fruits per day. Also having some almonds each day. Some days skipping some meals.   Started taking a multivitamin and vitamin D. Has been sleeping longer than usual.   No longer taking Phentermine (day before yesterday). Working on chewing well and not drinking during meals.   Still struggling to chew well and not drink during meals.   Weight: 302.4 lbs BMI: 53.6  Preferred Learning Style:   No preference indicated   Learning Readiness:   Ready  24-hr recall: B (AM): EAS protein shake with  banana and almond milk Snk (AM): none L (PM): fruit, nuts or cheese  Snk (PM): none D (PM): tilapia or other fish with vegetables and corn/peas Snk (PM): yogurt or banana or apples   Beverages: 2-3 bottles water, 1 glass tea with sugar, 16 oz whole milk, 1 protein shake per day    Medications: see list   Recent physical activity:  None recently  Progress Towards Goal(s):  In progress.  Handouts given during visit include:  Vitamin paperwork   Nutritional Diagnosis:  [place]-3.3 Obesity related to past poor dietary habits and physical inactivity as evidenced by patient attending supervised weight loss for insurance approval of bariatric surgery.    Intervention:  Nutrition counseling provided. Plan: Try having protein (almonds, cheese, fish, yogurt, meat, egg) with each meal or snack (fruit) at least 3 times per day. Have no more than 3 pieces of fruit total each day. Get back to exercising. Continue working on chewing 20-30 times per bite. Continue working on not drinking during  meals and waiting 30 minutes eating before drinking.  Cut back on sweet tea. Try skim or 1% milk.  Teaching Method Utilized:  Visual Auditory Hands on  Barriers to learning/adherence to lifestyle change: recent history of unstructured meal pattern  Demonstrated degree of understanding via:  Teach Back   Monitoring/Evaluation:  Dietary intake, exercise, and body weight. Follow up in 1 months for 6 month supervised weight loss visit.

## 2015-12-25 NOTE — Patient Instructions (Addendum)
Try having protein (almonds, cheese, fish, yogurt, meat, egg) with each meal or snack (fruit) at least 3 times per day. Have no more than 3 pieces of fruit total each day. Get back to exercising. Continue working on chewing 20-30 times per bite. Continue working on not drinking during meals and waiting 30 minutes eating before drinking.  Cut back on sweet tea. Try skim or 1% milk.

## 2016-01-02 ENCOUNTER — Ambulatory Visit (INDEPENDENT_AMBULATORY_CARE_PROVIDER_SITE_OTHER): Payer: Medicare Other | Admitting: Psychiatry

## 2016-01-02 ENCOUNTER — Encounter (HOSPITAL_COMMUNITY): Payer: Self-pay | Admitting: Psychiatry

## 2016-01-02 DIAGNOSIS — F319 Bipolar disorder, unspecified: Secondary | ICD-10-CM

## 2016-01-02 MED ORDER — QUETIAPINE FUMARATE ER 300 MG PO TB24: 300.0000 mg | ORAL_TABLET | Freq: Every day | ORAL | 0 refills | Status: DC

## 2016-01-02 MED ORDER — TRAZODONE HCL 100 MG PO TABS: 100.0000 mg | ORAL_TABLET | Freq: Every day | ORAL | 0 refills | Status: DC

## 2016-01-02 MED ORDER — CLONAZEPAM 0.5 MG PO TABS: 0.5000 mg | ORAL_TABLET | Freq: Three times a day (TID) | ORAL | 2 refills | Status: DC | PRN

## 2016-01-02 NOTE — Progress Notes (Signed)
[place] [phone] Progress Note  Shannon [name] [id number] 50 y.o.  01/02/2016 2:21 PM  Chief Complaint:  I stop taking Wellbutrin.  It was giving me twitching.              History of Present Illness: Martini came for her follow-up appointment.  On her last visit we started her on Wellbutrin however after 1 month she stopped because she was concerned about side effects and she noticed some time muscle twitching.  She is taking Seroquel, Klonopin and trazodone.  She sleeping 4-5 hours.  She is concerned about her weight but lately she is seeing nutritionist and her weight is a stable.  Due to her back pain she started again gabapentin but being only one tablet at bedtime.  She is excited about upcoming visit to [place] to meet her sister and mother.  She is not interested to try any new medication.  She is tolerating her current psychiatric medication.  She has no concern.  Her appetite is okay.  Her vital signs stable.  She denies any mania, psychosis, hallucination or any crying spells.  She denies any feeling of hopelessness or worthlessness.  She wants to continue her current psychiatric medication.  Suicidal Ideation: No Plan Formed: No Patient has means to carry out plan: No  Homicidal Ideation: No Plan Formed: No Patient has means to carry out plan: No  Review of Systems  Constitutional:       Weight gain  HENT: Negative.   Eyes: Negative for blurred vision.  Respiratory: Negative.  Negative for cough.   Cardiovascular: Negative for chest pain and palpitations.  Musculoskeletal: Positive for back pain.       Chronic back pain  Skin: Negative for itching and rash.  Neurological: Negative for dizziness and tingling.  Psychiatric/Behavioral: Negative for hallucinations, substance abuse and suicidal ideas.   Medical history: Patient has iron deficiency anemia, hyperlipidemia, morbid obesity, chronic pain, peptic ulcer disease, hypertension, vitamin D deficiency,  allergy rhinitis.  Her primary care physician is Dr. Ronnald Ramp.  Outpatient Encounter Prescriptions as of 01/02/2016  Medication Sig  . BYSTOLIC 10 MG tablet TAKE 1 TABLET (10 MG TOTAL) BY MOUTH DAILY.  . clonazePAM (KLONOPIN) 0.5 MG tablet Take 1 tablet (0.5 mg total) by mouth 3 (three) times daily as needed for anxiety.  . [employer] B-1 100 MG tablet TAKE 1 TABLET (100 MG TOTAL) BY MOUTH DAILY.  Marland Kitchen ezetimibe-simvastatin (VYTORIN) 10-40 MG tablet Take 1 tablet by mouth daily.  . FeAsp-B12-FA-C-DSS-SuccAc-Zn (FERIVA 21/7) 75-1 MG TABS Take 1 tablet by mouth daily. (Patient not taking: Reported on 09/09/2015)  . gabapentin (NEURONTIN) 600 MG tablet TAKE 1 TABLET (600 MG TOTAL) BY MOUTH 3 (THREE) TIMES DAILY.  Marland Kitchen KLOR-CON M20 20 MEQ tablet   . Multiple Vitamin (MULTIVITAMIN) tablet Take 1 tablet by mouth daily. Reported on 09/09/2015  . NEXIUM 40 MG capsule Take 1 capsule (40 mg total) by mouth 2 (two) times daily before a meal. Brand name only  . phentermine 37.5 MG capsule Take 1 capsule (37.5 mg total) by mouth every morning. (Patient not taking: Reported on 09/09/2015)  . QUEtiapine (SEROQUEL XR) 300 MG 24 hr tablet Take 1 tablet (300 mg total) by mouth daily.  . traZODone (DESYREL) 100 MG tablet Take 1 tablet (100 mg total) by mouth at bedtime.  . Vitamin D, Ergocalciferol, (DRISDOL) 50000 units CAPS capsule TAKE ONE CAPSULE BY MOUTH EVERY 7 DAYS  . [DISCONTINUED] buPROPion (WELLBUTRIN XL) 150 MG 24  hr tablet Take 1 tablet (150 mg total) by mouth daily.  . [DISCONTINUED] clonazePAM (KLONOPIN) 0.5 MG tablet Take 1 tablet (0.5 mg total) by mouth 3 (three) times daily as needed for anxiety.  . [DISCONTINUED] QUEtiapine (SEROQUEL XR) 300 MG 24 hr tablet Take 1 tablet (300 mg total) by mouth daily.  . [DISCONTINUED] traZODone (DESYREL) 100 MG tablet Take 1 tablet (100 mg total) by mouth at bedtime.   No facility-administered encounter medications on file as of 01/02/2016.     Past Psychiatric  History/Hospitalization(s): Patient has history of multiple psychiatric hospitalization due to suicidal thoughts, paranoia and hallucination.  She has one suicidal attempt when she cut her wrist.  She endorse mania, impulsivity, anger issues and severe depression.  Her last psychiatric hospitalization was 2014 at [facility].  In the past she had tried Zoloft, Risperdal, Zyprexa, lithium and Depakote.  She remember EPS with Risperdal.  Recently we tried Lamictal but she developed a rash.  She was also given generic Seroquel but she felt side effects with generic Seroquel. Anxiety: No Bipolar Disorder: Yes Depression: Yes Mania: Yes Psychosis: No Schizophrenia: No Personality Disorder: No Hospitalization for psychiatric illness: Yes History of Electroconvulsive Shock Therapy: No Prior Suicide Attempts: Yes  Physical Exam: Constitutional:  BP 128/78   Pulse 97   Ht 5\' 4"  (1.626 m)   Wt (!) 302 lb (137 kg)   BMI 51.84 kg/m  Pt refused  No results found for this or any previous visit (from the past 2160 hour(s)). General Appearance: alert, oriented, no acute distress  Musculoskeletal: Strength & Muscle Tone: within normal limits Gait & Station: normal Patient leans: N/A  Mental Status Examination: Patient is a middle-aged woman who is fully covered except her face due to her religious reason.  She is cooperative and maintained good eye contact.  She described her mood euthymic and her affect is appropriate.  Her speech is soft, clear, coherent and goal-directed.  There were no flight of ideas or any loose association.  She denies any auditory or visual hallucination.  She denies any active or passive suicidal thoughts or homicidal thought.  Her attention and concentration is okay. There were no delusions, paranoia or any obsessive thoughts.  Her fund of knowledge is adequate.  She has no EPS, tremors or shakes.  Her memory is good.  She is alert and oriented 3.  Her insight  judgment and impulse control is okay.  Established Problem, Stable/Improving (1), Review of Last Therapy Session (1), Review of Medication Regimen & Side Effects (2) and Review of New Medication or Change in Dosage (2)  Assessment: Axis I; bipolar disorder with psychotic features, panic disorder without agoraphobia  Axis II; deferred  Axis III;  Past Medical History:  Diagnosis Date  . Allergic rhinitis   . ANEMIA-IRON DEFICIENCY 01/25/2009  . Anxiety   . Bipolar 1 disorder ([place])   . Chronic pain syndrome   . Complete tear of right rotator cuff 07/21/2013  . Contact lens/glasses fitting   . [place] DISEASE, LUMBAR 01/25/2009  . HYPERLIPIDEMIA 01/25/2009  . HYPERTENSION 01/25/2009  . MANIC DEPRESSIVE ILLNESS 01/25/2009   pt is unsue if this is her specifc dx  . Morbid obesity ([place]) 01/25/2009  . Night sweats   . PEPTIC ULCER DISEASE, HELICOBACTER PYLORI POSITIVE 10/03/2009    Plan Patient is a stable on her current psychiatric medication.  I will discontinue Wellbutrin since patient is no longer taking it.  Discussed medication side effects and benefits.  Continue Klonopin 0.5 mg 3 times a day, Seroquel 200 mg at bedtime and trazodone 100 mg at bedtime.  Recommended to call us back if she has any question or any concern.  Follow-up in 3 months.   Danaisha Celli T., MD  01/02/2016

## 2016-01-09 ENCOUNTER — Other Ambulatory Visit (HOSPITAL_COMMUNITY): Payer: Self-pay | Admitting: Psychiatry

## 2016-01-09 DIAGNOSIS — F319 Bipolar disorder, unspecified: Secondary | ICD-10-CM

## 2016-01-12 ENCOUNTER — Other Ambulatory Visit (HOSPITAL_COMMUNITY): Payer: Self-pay | Admitting: Psychiatry

## 2016-01-12 DIAGNOSIS — F319 Bipolar disorder, unspecified: Secondary | ICD-10-CM

## 2016-01-27 ENCOUNTER — Encounter: Payer: Medicare Other | Attending: Surgery | Admitting: Dietician

## 2016-01-27 DIAGNOSIS — Z01818 Encounter for other preprocedural examination: Secondary | ICD-10-CM | POA: Diagnosis not present

## 2016-01-27 DIAGNOSIS — Z713 Dietary counseling and surveillance: Secondary | ICD-10-CM | POA: Insufficient documentation

## 2016-01-27 NOTE — Progress Notes (Signed)
  6 Months Supervised Weight Loss Visit:   Pre-Operative sleeve gastrectomy Surgery  Medical Nutrition Therapy:  Appt start time: 1100 end time:  1115  Primary concerns today: Supervised Weight Loss Visit. Returns with a 7 lb weight gain since last month. Traveled to [place] for the week of Thanksgiving and ate out a lot. Felt like she was moving a lot while she was there. Has not gotten back to exercise since she has been back. Feels like she has an addictive personality especially with sweets. Eating a lot of snacks late at night. Does not like to sweets in front of her husband. Will eat them after he goes to bed (after 1 am). Felt like things were going well before Thanksgiving.   Has seen a therapist in the past, most recently about 3-4 years ago. Does not really want to see a therapist again since she did not find it helping. Contemplating whether she wants to have bariatric surgery.   Weight: 302.4 lbs BMI: 53.6  Preferred Learning Style:   No preference indicated   Learning Readiness:   Ready  24-hr recall: B (AM): EAS protein shake with  banana and almond milk Snk (AM): none L (PM): fruit, nuts or cheese  Snk (PM): none D (PM): tilapia or other fish with vegetables and corn/peas Snk (PM): yogurt or banana or apples   Beverages: 2-3 bottles water, 1 glass tea with sugar, 16 oz whole milk, 1 protein shake per day    Medications: see list   Recent physical activity:  None recently  Progress Towards Goal(s):  In progress.  Handouts given during visit include:  Vitamin paperwork   Nutritional Diagnosis:  [place]-3.3 Obesity related to past poor dietary habits and physical inactivity as evidenced by patient attending supervised weight loss for insurance approval of bariatric surgery.    Intervention:  Nutrition counseling provided. Plan: Get to back to having protein shakes in the morning. Get to having 3 meals per day (protein and carb (fruit, mixed vegetables, 1 portion  cookies). Consider telling your husband about the cookies.  Think about getting help (therapist) for "food addiction".   Teaching Method Utilized:  Visual Auditory Hands on  Barriers to learning/adherence to lifestyle change: recent history of unstructured meal pattern  Demonstrated degree of understanding via:  Teach Back   Monitoring/Evaluation:  Dietary intake, exercise, and body weight. Follow up in 1 months for 6 month supervised weight loss visit.

## 2016-01-27 NOTE — Patient Instructions (Addendum)
Get to back to having protein shakes in the morning. Get to having 3 meals per day (protein and carb (fruit, mixed vegetables, 1 portion cookies). Consider telling your husband about the cookies.  Think about getting help (therapist) for "food addiction".

## 2016-02-04 ENCOUNTER — Other Ambulatory Visit: Payer: Self-pay | Admitting: Internal Medicine

## 2016-02-20 ENCOUNTER — Other Ambulatory Visit: Payer: Self-pay | Admitting: Internal Medicine

## 2016-02-27 ENCOUNTER — Encounter: Payer: Medicare Other | Attending: Surgery | Admitting: Dietician

## 2016-02-27 DIAGNOSIS — Z01818 Encounter for other preprocedural examination: Secondary | ICD-10-CM | POA: Diagnosis not present

## 2016-02-27 DIAGNOSIS — Z713 Dietary counseling and surveillance: Secondary | ICD-10-CM | POA: Diagnosis not present

## 2016-02-27 NOTE — Patient Instructions (Addendum)
Plan to exercise between 5:30-7 PM every day (app, stretches). Get to having 3 meals per day (protein and carb (fruit, mixed vegetables, 1 portion cookies). Have snacks only if you are hungry.  Try to eat meals and snacks at the table with no TV. (Eat mindfully)

## 2016-02-27 NOTE — Progress Notes (Signed)
  6 Months Supervised Weight Loss Visit:   Pre-Operative sleeve gastrectomy Surgery  Medical Nutrition Therapy:  Appt start time: 1100 end time:  1115  Primary concerns today: Supervised Weight Loss Visit. Returns with a 3 lb weight gain since last month. Feels like she ate more throughout the holidays. Feels like she is eating the right foods (fruit, nuts, protein shakes, vegetables, 1% milk) in addition to the "wrong" foods. Overall feels like she is eating better than before. 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 This is helping to keep her busy. Also volunteers with the [employer].   Has been avoiding fried foods. Still having cookies. Gave away some special cookies from [place].   Feels ready to get back to stretching/exercising. Does want to move forward with the surgery.   Sets alarm to get up by 9:30 AM in the morning to eat.   Eats 3 fruits per day and 25 almonds at a time about 3 x day. Does not like raw vegetables.   Weight: 312.3 lbs BMI: 55.3  Preferred Learning Style:   No preference indicated   Learning Readiness:   contemplating  24-hr recall: B (AM): EAS protein shake with flaxseeds, banana and almond milk Snk (AM): fruit and nuts L (PM): fruit, nuts or cheese  Snk (PM): fruit and nuts D (PM): tilapia or other fish with vegetables and corn/peas Snk (PM): yogurt or banana or apples or cookies  Beverages: 2-3 bottles water, 1 glass tea with sugar, 16 oz 1% milk, 1 protein shake per day    Medications: see list   Recent physical activity:  None recently  Progress Towards Goal(s):  In progress.  Handouts given during visit include:  Vitamin paperwork   Nutritional Diagnosis:  [place]-3.3 Obesity related to past poor dietary habits and physical inactivity as evidenced by patient attending supervised weight loss for insurance approval of bariatric surgery.    Intervention:  Nutrition counseling provided. Plan: Plan to exercise between 5:30-7 PM every  day (app, stretches). Get to having 3 meals per day (protein and carb (fruit, mixed vegetables, 1 portion cookies). Have snacks only if you are hungry.  Try to eat meals and snacks at the table with no TV. (Eat mindfully)  Teaching Method Utilized:  Visual Auditory Hands on  Barriers to learning/adherence to lifestyle change: recent history of unstructured meal pattern  Demonstrated degree of understanding via:  Teach Back   Monitoring/Evaluation:  Dietary intake, exercise, and body weight. Follow up in 1 months for 6 month supervised weight loss visit.

## 2016-03-09 ENCOUNTER — Other Ambulatory Visit (HOSPITAL_COMMUNITY): Payer: Self-pay

## 2016-03-09 DIAGNOSIS — F319 Bipolar disorder, unspecified: Secondary | ICD-10-CM

## 2016-03-09 MED ORDER — CLONAZEPAM 0.5 MG PO TABS: 0.5000 mg | ORAL_TABLET | Freq: Three times a day (TID) | ORAL | 0 refills | Status: DC | PRN

## 2016-03-09 MED ORDER — TRAZODONE HCL 100 MG PO TABS: 100.0000 mg | ORAL_TABLET | Freq: Every day | ORAL | 0 refills | Status: DC

## 2016-03-09 MED ORDER — QUETIAPINE FUMARATE ER 300 MG PO TB24: 300.0000 mg | ORAL_TABLET | Freq: Every day | ORAL | 0 refills | Status: DC

## 2016-03-30 ENCOUNTER — Encounter: Payer: Medicare Other | Attending: Surgery | Admitting: Dietician

## 2016-03-30 DIAGNOSIS — Z01818 Encounter for other preprocedural examination: Secondary | ICD-10-CM | POA: Diagnosis not present

## 2016-03-30 DIAGNOSIS — Z713 Dietary counseling and surveillance: Secondary | ICD-10-CM | POA: Insufficient documentation

## 2016-03-30 NOTE — Progress Notes (Signed)
  6 Months Supervised Weight Loss Visit:   Pre-Operative sleeve gastrectomy Surgery  Medical Nutrition Therapy:  Appt start time: 1110 end time:  1125  Primary concerns today: Supervised Weight Loss Visit.   Shannon [name] returns with a 3-lb weight gain. She reports that she has made major changes regarding her diet; has cut back on sweets significantly and has completely stopped eating fried foods. She reports that she is tired of eating fish and states that Premier shakes were expensive and constipated her. Has starting eating fruit more instead of cookies and other sweets.   We discussed getting more creative with protein foods. Emphasized the importance of protein foods after surgery.   Weight: 315.4 lbs BMI: 55.87  Preferred Learning Style:   No preference indicated   Learning Readiness:   contemplating  24-hr recall: B (AM): EAS protein shake with flaxseeds, banana and almond milk Snk (AM): fruit and nuts L (PM): fruit, nuts or cheese  Snk (PM): fruit and nuts D (PM): tilapia or other fish with vegetables and corn/peas Snk (PM): yogurt or banana or apples or cookies  Beverages: 2-3 bottles water, 1 glass tea with sugar, 16 oz 1% milk, 1 protein shake per day    Medications: see list   Recent physical activity:  None recently  Progress Towards Goal(s):  In progress.  Handouts given during visit include:  Vitamin paperwork   Nutritional Diagnosis:  [place]-3.3 Obesity related to past poor dietary habits and physical inactivity as evidenced by patient attending supervised weight loss for insurance approval of bariatric surgery.    Intervention:  Nutrition counseling provided. Plan: Plan to exercise between 5:30-7 PM every day (app, stretches). Get to having 3 meals per day (protein and carb (fruit, mixed vegetables, 1 portion cookies). Have snacks only if you are hungry.  Try to eat meals and snacks at the table with no TV. (Eat mindfully)  Teaching Method Utilized:   Visual Auditory Hands on  Barriers to learning/adherence to lifestyle change: recent history of unstructured meal pattern  Demonstrated degree of understanding via:  Teach Back   Monitoring/Evaluation:  Dietary intake, exercise, and body weight. Follow up in 1 months for 6 month supervised weight loss visit.

## 2016-03-30 NOTE — Patient Instructions (Addendum)
Plan to exercise between 5:30-7 PM every day (app, stretches). Get to having 3 meals per day (protein and carb (fruit, mixed vegetables, 1 portion cookies). Have snacks only if you are hungry.  Try to eat meals and snacks at the table with no TV. (Eat mindfully)  Aim to eat a protein with each meal  -meats: lamb, chicken, [place], and fish  -yogurt, milk, cheese, beans, eggs  -Get creative with protein foods  -Try making soup or turkey/chicken salad with leftover meat  - Plan to talk to Dr. Lucia Gaskins about surgery and reflux and constipation

## 2016-04-09 ENCOUNTER — Other Ambulatory Visit (HOSPITAL_COMMUNITY): Payer: Self-pay | Admitting: Psychiatry

## 2016-04-09 ENCOUNTER — Ambulatory Visit (INDEPENDENT_AMBULATORY_CARE_PROVIDER_SITE_OTHER): Payer: Medicare Other | Admitting: Psychiatry

## 2016-04-09 ENCOUNTER — Encounter (HOSPITAL_COMMUNITY): Payer: Self-pay | Admitting: Psychiatry

## 2016-04-09 VITALS — BP 136/76 | HR 99 | Ht 64.0 in | Wt 318.0 lb

## 2016-04-09 DIAGNOSIS — F319 Bipolar disorder, unspecified: Secondary | ICD-10-CM

## 2016-04-09 DIAGNOSIS — Z9049 Acquired absence of other specified parts of digestive tract: Secondary | ICD-10-CM | POA: Diagnosis not present

## 2016-04-09 DIAGNOSIS — Z823 Family history of stroke: Secondary | ICD-10-CM

## 2016-04-09 DIAGNOSIS — Z8249 Family history of ischemic heart disease and other diseases of the circulatory system: Secondary | ICD-10-CM

## 2016-04-09 DIAGNOSIS — Z9851 Tubal ligation status: Secondary | ICD-10-CM | POA: Diagnosis not present

## 2016-04-09 DIAGNOSIS — Z833 Family history of diabetes mellitus: Secondary | ICD-10-CM

## 2016-04-09 DIAGNOSIS — Z825 Family history of asthma and other chronic lower respiratory diseases: Secondary | ICD-10-CM

## 2016-04-09 DIAGNOSIS — Z79899 Other long term (current) drug therapy: Secondary | ICD-10-CM

## 2016-04-09 DIAGNOSIS — Z9889 Other specified postprocedural states: Secondary | ICD-10-CM | POA: Diagnosis not present

## 2016-04-09 DIAGNOSIS — Z811 Family history of alcohol abuse and dependence: Secondary | ICD-10-CM

## 2016-04-09 DIAGNOSIS — Z888 Allergy status to other drugs, medicaments and biological substances status: Secondary | ICD-10-CM

## 2016-04-09 DIAGNOSIS — F17210 Nicotine dependence, cigarettes, uncomplicated: Secondary | ICD-10-CM

## 2016-04-09 MED ORDER — QUETIAPINE FUMARATE ER 300 MG PO TB24: 300.0000 mg | ORAL_TABLET | Freq: Every day | ORAL | 2 refills | Status: DC

## 2016-04-09 MED ORDER — TRAZODONE HCL 100 MG PO TABS: 100.0000 mg | ORAL_TABLET | Freq: Every day | ORAL | 2 refills | Status: DC

## 2016-04-09 MED ORDER — CLONAZEPAM 0.5 MG PO TABS: 0.5000 mg | ORAL_TABLET | Freq: Three times a day (TID) | ORAL | 2 refills | Status: DC | PRN

## 2016-04-09 NOTE — Progress Notes (Signed)
BH MD/PA/NP OP Progress Note  04/09/2016 2:14 PM Shannon [name]  MRN:  [id number]  Chief Complaint:  Subjective:  I had a good trip to [place].  I really enjoy my time after so many years.  HPI: Patient came for her follow-up appointment.  She had a good trip to [place] and after so many years he really enjoyed this time because she was open with her family members.  She is taking her medication and reported no side effects.  She is concerned about her weight and she admitted some time she has no motivation to do things.  She gets easily tired and difficulty completing the task.  In the past he had tried Wellbutrin but she developed twitching.  She is reluctant to try any other medication.  She sleeping good.  She denies any irritability, anger, paranoia or any hallucination.  She endorse back pain and that also limits her motivation.  Her energy level is fair.  Patient denies drinking alcohol or using any illegal substances.  She lives with her husband who is very supportive.  Visit Diagnosis:    ICD-9-CM ICD-10-CM   1. Bipolar I disorder (HCC) 296.7 F31.9 traZODone (DESYREL) 100 MG tablet     QUEtiapine (SEROQUEL XR) 300 MG 24 hr tablet     clonazePAM (KLONOPIN) 0.5 MG tablet    Past Psychiatric History: Reviewed. Patient has history of multiple psychiatric hospitalization due to suicidal thoughts, paranoia and hallucination.  She has one suicidal attempt when she cut her wrist.  She endorse mania, impulsivity, anger issues and severe depression.  Her last psychiatric hospitalization was 2014 at [place].  In the past she had tried Zoloft, Risperdal, Zyprexa, lithium and Depakote.  She remember EPS with Risperdal.  Recently we tried Lamictal but she developed a rash.   she also tried Wellbutrin but developed twitching.    Past Medical History:  Past Medical History:  Diagnosis Date  . Allergic rhinitis   . ANEMIA-IRON DEFICIENCY 01/25/2009  . Anxiety   . Bipolar 1  disorder ([place])   . Chronic pain syndrome   . Complete tear of right rotator cuff 07/21/2013  . Contact lens/glasses fitting   . [place] DISEASE, LUMBAR 01/25/2009  . HYPERLIPIDEMIA 01/25/2009  . HYPERTENSION 01/25/2009  . MANIC DEPRESSIVE ILLNESS 01/25/2009   pt is unsue if this is her specifc dx  . Morbid obesity ([place]) 01/25/2009  . Night sweats   . PEPTIC ULCER DISEASE, HELICOBACTER PYLORI POSITIVE 10/03/2009    Past Surgical History:  Procedure Laterality Date  . ABDOMINAL HYSTERECTOMY    . BLADDER SURGERY     s/p with ?diverticulitis  . HAND TENDON SURGERY  1991   s/p-Right-index and middle  . LAPAROSCOPIC GASTRIC BANDING  07/14/10  . SHOULDER ARTHROSCOPY Right 07/21/2013   Procedure: RIGHT ARTHROSCOPY SHOULDER DEBRIDMENT EXTENTSIVE,ARTHROSCOPIC REMOVE LOOSE FOREIGN BODY, BICEPS TENOLYSIS ;  Surgeon: Johnny Bridge, MD;  Location: [facility];  Service: Orthopedics;  Laterality: Right;  . TUBAL LIGATION      Family Psychiatric History: Reviewed.  Family History:  Family History  Problem Relation Age of Onset  . Hypertension Mother   . Asthma Mother   . Stroke Father   . Cirrhosis Father     ETOH  . Hypertension Father   . Diabetes Father   . Alcohol abuse Father   . Hypertension Sister   . Asthma Sister   . ADD / ADHD Son   . Hypertension Paternal Aunt   .  Diabetes Maternal Grandmother   . ADD / ADHD Other     3 nephews, also emotional issues  . Diabetes Other     Uncle  . Diabetes Other     Aunt  . Hypertension Brother   . Suicidality Neg Hx     Social History:  Social History   Social History  . Marital status: Divorced    Spouse name: N/A  . Number of children: 2  . Years of education: N/A   Occupational History  . STUDENT Unemployed   Social History Main Topics  . Smoking status: Current Every Day Smoker    Packs/day: 0.25    Years: 20.00    Types: Cigarettes  . Smokeless tobacco: Never Used     Comment: States has cut back to 5-7 a  day and working on this.   . Alcohol use No  . Drug use: No  . Sexual activity: Not Currently   Other Topics Concern  . None   Social History Narrative   Moved from [place], then [place] to [place] 2009   2 children-1 boy, 1 girl   Disabled-bipolar   Daily Caffeine Use-1 cup/day    Allergies:  Allergies  Allergen Reactions  . Lamictal [Lamotrigine] Rash    Metabolic Disorder Labs: Lab Results  Component Value Date   HGBA1C 5.6 06/11/2014   No results found for: PROLACTIN Lab Results  Component Value Date   CHOL 191 06/12/2015   TRIG 117.0 06/12/2015   HDL 48.10 06/12/2015   CHOLHDL 4 06/12/2015   VLDL 23.4 06/12/2015   LDLCALC 119 (H) 06/12/2015   LDLCALC 124 (H) 09/26/2014     Current Medications: Current Outpatient Prescriptions  Medication Sig Dispense Refill  . BYSTOLIC 10 MG tablet TAKE 1 TABLET (10 MG TOTAL) BY MOUTH DAILY. 90 tablet 1  . clonazePAM (KLONOPIN) 0.5 MG tablet Take 1 tablet (0.5 mg total) by mouth 3 (three) times daily as needed for anxiety. 90 tablet 0  . [facility]S B-1 100 MG tablet TAKE 1 TABLET EVERY DAY 100 tablet 1  . ezetimibe-simvastatin (VYTORIN) 10-40 MG tablet Take 1 tablet by mouth daily. 90 tablet 3  . FeAsp-B12-FA-C-DSS-SuccAc-Zn (FERIVA 21/7) 75-1 MG TABS Take 1 tablet by mouth daily. 28 tablet 11  . gabapentin (NEURONTIN) 600 MG tablet TAKE 1 TABLET (600 MG TOTAL) BY MOUTH 3 (THREE) TIMES DAILY. 90 tablet 11  . KLOR-CON M20 20 MEQ tablet     . Multiple Vitamin (MULTIVITAMIN) tablet Take 1 tablet by mouth daily. Reported on 09/09/2015    . NEXIUM 40 MG capsule Take 1 capsule (40 mg total) by mouth 2 (two) times daily before a meal. Brand name only 180 capsule 3  . phentermine 37.5 MG capsule Take 1 capsule (37.5 mg total) by mouth every morning. 30 capsule 2  . QUEtiapine (SEROQUEL XR) 300 MG 24 hr tablet Take 1 tablet (300 mg total) by mouth daily. 30 tablet 0  . traZODone (DESYREL) 100 MG tablet Take 1 tablet (100 mg total) by mouth  at bedtime. 30 tablet 0  . Vitamin D, Ergocalciferol, (DRISDOL) 50000 units CAPS capsule TAKE ONE CAPSULE BY MOUTH EVERY 7 DAYS 12 capsule 3   No current facility-administered medications for this visit.     Neurologic: Headache: No Seizure: No Paresthesias: No  Musculoskeletal: Strength & Muscle Tone: within normal limits Gait & Station: normal Patient leans: N/A  Psychiatric Specialty Exam: Review of Systems  Constitutional: Positive for malaise/fatigue. Negative for weight loss.  HENT: Negative.   Musculoskeletal: Positive for back pain.  Skin: Negative.     Blood pressure 136/76, pulse 99, height 5\' 4"  (1.626 m), weight (!) 318 lb (144.2 kg), SpO2 96 %.Body mass index is 54.58 kg/m.  General Appearance: Casual and cover her head due to religious reason  Eye Contact:  Good  Speech:  Clear and Coherent  Volume:  Normal  Mood:  Euthymic  Affect:  Appropriate  Thought Process:  Goal Directed  Orientation:  Full (Time, Place, and Person)  Thought Content: Logical   Suicidal Thoughts:  No  Homicidal Thoughts:  No  Memory:  Immediate;   Good Recent;   Good Remote;   Good  Judgement:  Good  Insight:  Good  Psychomotor Activity:  Normal  Concentration:  Concentration: Good and Attention Span: Good  Recall:  Good  Fund of Knowledge: Good  Language: Good  Akathisia:  No  Handed:  Right  AIMS (if indicated):  0  Assets:  Communication Skills Desire for Improvement Housing  ADL's:  Intact  Cognition: WNL  Sleep:  good    Assessment: Bipolar disorder type I.  Plan: Discuss weight loss program.  She used to take phentermine provided by her primary care physician however she stop as she cannot afford.  Recommended to watch her calorie intake and encourage to take more vegetables and to have her exercise.  Patient does not want to change her medication.  I will continue Seroquel 200 mg at bedtime, trazodone 100 mg at bedtime and Klonopin 0.5 mg 3 times a day.  Patient  does not ask for early refills for her benzodiazepine.  Discussed medication side effects and benefits.  Recommended to call us back if she has any question, concern or if she feels worsening of the symptom.  Follow-up in 3 months.  [name] Coppock T., MD 04/09/2016, 2:14 PM

## 2016-04-10 ENCOUNTER — Other Ambulatory Visit (HOSPITAL_COMMUNITY): Payer: Self-pay | Admitting: Psychiatry

## 2016-04-10 DIAGNOSIS — F319 Bipolar disorder, unspecified: Secondary | ICD-10-CM

## 2016-04-16 ENCOUNTER — Telehealth: Payer: Self-pay

## 2016-04-16 NOTE — Telephone Encounter (Signed)
Pt stated that her insurance changed and she needs generics sent in.   LVM for pt to call back as soon as possible.   RE: confirming information.

## 2016-04-28 ENCOUNTER — Encounter: Payer: Self-pay | Admitting: Skilled Nursing Facility1

## 2016-04-28 ENCOUNTER — Encounter: Payer: Medicare Other | Attending: Surgery | Admitting: Skilled Nursing Facility1

## 2016-04-28 DIAGNOSIS — Z713 Dietary counseling and surveillance: Secondary | ICD-10-CM | POA: Diagnosis present

## 2016-04-28 DIAGNOSIS — Z01818 Encounter for other preprocedural examination: Secondary | ICD-10-CM | POA: Insufficient documentation

## 2016-04-28 DIAGNOSIS — E669 Obesity, unspecified: Secondary | ICD-10-CM

## 2016-04-28 NOTE — Progress Notes (Signed)
  6 Months Supervised Weight Loss Visit:   Pre-Operative sleeve gastrectomy Surgery  Medical Nutrition Therapy:  Appt start time: 1110 end time:  1125  Primary concerns today: Supervised Weight Loss Visit.   Pt arrives having lost 2 pounds. Pt states she has surgery scheduled for the 19th of March. Dietitian had her Start on the pre-op diet.   Goals: Morning drink a protein shake Lunch protein and vegetables Dinner protein and vegetables If you want a snack choose a vegetable or a protein shake No fruit    Weight: 313.1 lbs BMI: 55.46  Preferred Learning Style:   No preference indicated   Learning Readiness:   contemplating  24-hr recall: B (AM): EAS protein shake with flaxseeds, banana and almond milk Snk (AM): fruit and nuts L (PM): fruit, nuts or cheese  Snk (PM): fruit and nuts D (PM): tilapia or other fish with vegetables and corn/peas Snk (PM): yogurt or banana or apples or cookies  Beverages: 2-3 bottles water, 1 glass tea with sugar, 16 oz 1% milk, 1 protein shake per day    Medications: see list   Recent physical activity:  None recently  Progress Towards Goal(s):  In progress.  Handouts given during visit include:  Vitamin paperwork   Nutritional Diagnosis:  [place]-3.3 Obesity related to past poor dietary habits and physical inactivity as evidenced by patient attending supervised weight loss for insurance approval of bariatric surgery.    Intervention:  Nutrition counseling provided. Plan: Plan to exercise between 5:30-7 PM every day (app, stretches). Get to having 3 meals per day (protein and carb (fruit, mixed vegetables, 1 portion cookies). Have snacks only if you are hungry.  Try to eat meals and snacks at the table with no TV. (Eat mindfully)  Teaching Method Utilized:  Visual Auditory Hands on  Barriers to learning/adherence to lifestyle change: recent history of unstructured meal pattern  Demonstrated degree of understanding via:  Teach  Back   Monitoring/Evaluation:  Dietary intake, exercise, and body weight.

## 2016-04-28 NOTE — Patient Instructions (Signed)
Morning drink a protein shake  Lunch protein and vegetables  Dinner protein and vegetables  If you want a snack choose a vegetable or a protein shake  No fruit

## 2016-05-01 DIAGNOSIS — I10 Essential (primary) hypertension: Secondary | ICD-10-CM | POA: Diagnosis not present

## 2016-05-01 DIAGNOSIS — Z9884 Bariatric surgery status: Secondary | ICD-10-CM | POA: Diagnosis not present

## 2016-05-20 ENCOUNTER — Telehealth (HOSPITAL_COMMUNITY): Payer: Self-pay

## 2016-05-20 NOTE — Telephone Encounter (Signed)
Patient is calling to let you know that she has been having insomnia for the last 2 1/2 weeks. Patient takes Klonopin .5 mg 2 tabs, Seroquel 300 mg 1 tab, and Trazodone 100 mg 1 tab at about 9 pm - but she is not going to sleep. Please review and advise, thank you

## 2016-05-21 NOTE — Telephone Encounter (Signed)
She can try trazodone 150-200 mg at bed time

## 2016-05-22 NOTE — Telephone Encounter (Signed)
I called the patient and left her a voicemail letting her know to increase the dose and to call me back next week to see how she is doing

## 2016-06-15 ENCOUNTER — Other Ambulatory Visit (HOSPITAL_COMMUNITY): Payer: Self-pay | Admitting: Surgery

## 2016-06-15 DIAGNOSIS — Z9884 Bariatric surgery status: Secondary | ICD-10-CM

## 2016-07-02 ENCOUNTER — Ambulatory Visit (HOSPITAL_COMMUNITY)
Admission: RE | Admit: 2016-07-02 | Discharge: 2016-07-02 | Disposition: A | Discharge status: Home / Self Care | Payer: Medicare Other | Source: Ambulatory Visit | Attending: Surgery | Admitting: Surgery

## 2016-07-02 DIAGNOSIS — K7689 Other specified diseases of liver: Secondary | ICD-10-CM | POA: Diagnosis not present

## 2016-07-02 DIAGNOSIS — Z9884 Bariatric surgery status: Secondary | ICD-10-CM

## 2016-07-02 DIAGNOSIS — K449 Diaphragmatic hernia without obstruction or gangrene: Secondary | ICD-10-CM | POA: Insufficient documentation

## 2016-07-07 ENCOUNTER — Telehealth (HOSPITAL_COMMUNITY): Payer: Self-pay | Admitting: Psychiatry

## 2016-07-07 ENCOUNTER — Encounter (HOSPITAL_COMMUNITY): Payer: Self-pay | Admitting: Psychiatry

## 2016-07-07 ENCOUNTER — Ambulatory Visit (INDEPENDENT_AMBULATORY_CARE_PROVIDER_SITE_OTHER): Payer: Medicare Other | Admitting: Psychiatry

## 2016-07-07 DIAGNOSIS — F319 Bipolar disorder, unspecified: Secondary | ICD-10-CM | POA: Diagnosis not present

## 2016-07-07 DIAGNOSIS — Z818 Family history of other mental and behavioral disorders: Secondary | ICD-10-CM

## 2016-07-07 DIAGNOSIS — Z79899 Other long term (current) drug therapy: Secondary | ICD-10-CM

## 2016-07-07 DIAGNOSIS — F17210 Nicotine dependence, cigarettes, uncomplicated: Secondary | ICD-10-CM

## 2016-07-07 DIAGNOSIS — Z811 Family history of alcohol abuse and dependence: Secondary | ICD-10-CM | POA: Diagnosis not present

## 2016-07-07 MED ORDER — TRAZODONE HCL 150 MG PO TABS: 150.0000 mg | ORAL_TABLET | Freq: Every day | ORAL | 2 refills | Status: DC

## 2016-07-07 MED ORDER — CLONAZEPAM 0.5 MG PO TABS: 0.5000 mg | ORAL_TABLET | Freq: Three times a day (TID) | ORAL | 2 refills | Status: DC | PRN

## 2016-07-07 MED ORDER — QUETIAPINE FUMARATE ER 300 MG PO TB24: 300.0000 mg | ORAL_TABLET | Freq: Every day | ORAL | 2 refills | Status: DC

## 2016-07-07 NOTE — Telephone Encounter (Signed)
error 

## 2016-07-07 NOTE — Progress Notes (Signed)
BH MD/PA/NP OP Progress Note  07/07/2016 1:33 PM Shannon [name]  MRN:  [id number]  Chief Complaint:  Subjective:  I'm doing good but sometimes I have difficulty sleeping.  HPI: Patient came for her follow-up appointment.  She decided because she has a [place] 4 weeks ago.  She is hoping that her son moved from [place] to [place].  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  She is in the process of getting that company registered and sometime gets anxious and nervous.  But she is happy with the good cause .  Some might she has difficulty sleeping.  She denies any major panic attack but sometime feeling nervous.  Her husband is going to [place] after 12 years .  She is happy for him.  Patient told her husband is very supportive and she is hoping that next time she can go with him .  Patient denies any irritability, anger, mania, psychosis.  She admitted get some time tired but she is motivated to do things.  She is concerned about her weight and now she is in a process of getting sleeve procedure.  She stopped taking phentermine because it was not working.  Her appetite is okay.  Her vital signs are stable.  Patient denies any suicidal thoughts or homicidal thought.  Visit Diagnosis:    ICD-9-CM ICD-10-CM   1. Bipolar I disorder (HCC) 296.7 F31.9 traZODone (DESYREL) 150 MG tablet     QUEtiapine (SEROQUEL XR) 300 MG 24 hr tablet     clonazePAM (KLONOPIN) 0.5 MG tablet    Past Psychiatric History:  Patient has history of multiple psychiatric hospitalization due to suicidal thoughts, paranoia and hallucination. She has one suicidal attempt when she cut her wrist. She endorse mania, impulsivity, anger issues and severe depression. Her last psychiatric hospitalization was 2014 at [facility]. In the past she had tried Zoloft, Risperdal, Zyprexa, lithium and Depakote. She remember EPS with Risperdal. She also tried Lamictal but developed a rash  and Wellbutrin cause twitching.       Past Medical History:  Past Medical History:  Diagnosis Date  . Allergic rhinitis   . ANEMIA-IRON DEFICIENCY 01/25/2009  . Anxiety   . Bipolar 1 disorder ([place])   . Chronic pain syndrome   . Complete tear of right rotator cuff 07/21/2013  . Contact lens/glasses fitting   . [place] DISEASE, LUMBAR 01/25/2009  . HYPERLIPIDEMIA 01/25/2009  . HYPERTENSION 01/25/2009  . MANIC DEPRESSIVE ILLNESS 01/25/2009   pt is unsue if this is her specifc dx  . Morbid obesity ([place]) 01/25/2009  . Night sweats   . PEPTIC ULCER DISEASE, HELICOBACTER PYLORI POSITIVE 10/03/2009    Past Surgical History:  Procedure Laterality Date  . ABDOMINAL HYSTERECTOMY    . BLADDER SURGERY     s/p with ?diverticulitis  . HAND TENDON SURGERY  1991   s/p-Right-index and middle  . LAPAROSCOPIC GASTRIC BANDING  07/14/10  . SHOULDER ARTHROSCOPY Right 07/21/2013   Procedure: RIGHT ARTHROSCOPY SHOULDER DEBRIDMENT EXTENTSIVE,ARTHROSCOPIC REMOVE LOOSE FOREIGN BODY, BICEPS TENOLYSIS ;  Surgeon: Johnny Bridge, MD;  Location: [facility];  Service: Orthopedics;  Laterality: Right;  . TUBAL LIGATION      Family Psychiatric History: Reviewed.  Family History:  Family History  Problem Relation Age of Onset  . Hypertension Mother   . Asthma Mother   . Stroke Father   . Cirrhosis Father        ETOH  . Hypertension  Father   . Diabetes Father   . Alcohol abuse Father   . Hypertension Sister   . Asthma Sister   . Hypertension Brother   . ADD / ADHD Son   . Hypertension Paternal Aunt   . Diabetes Maternal Grandmother   . ADD / ADHD Other        3 nephews, also emotional issues  . Diabetes Other        Uncle  . Diabetes Other        Aunt  . Suicidality Neg Hx     Social History:  Social History   Social History  . Marital status: Divorced    Spouse name: N/A  . Number of children: 2  . Years of education: N/A   Occupational History  . STUDENT Unemployed    Social History Main Topics  . Smoking status: Current Every Day Smoker    Packs/day: 0.25    Years: 20.00    Types: Cigarettes  . Smokeless tobacco: Never Used     Comment: States has cut back to 5-7 a day and working on this.   . Alcohol use No  . Drug use: No  . Sexual activity: Not Currently   Other Topics Concern  . Not on file   Social History Narrative   Moved from [place], then [place] to [place] 2009   2 children-1 boy, 1 girl   Disabled-bipolar   Daily Caffeine Use-1 cup/day    Allergies:  Allergies  Allergen Reactions  . Lamictal [Lamotrigine] Rash    Metabolic Disorder Labs: Lab Results  Component Value Date   HGBA1C 5.6 06/11/2014   No results found for: PROLACTIN Lab Results  Component Value Date   CHOL 191 06/12/2015   TRIG 117.0 06/12/2015   HDL 48.10 06/12/2015   CHOLHDL 4 06/12/2015   VLDL 23.4 06/12/2015   LDLCALC 119 (H) 06/12/2015   LDLCALC 124 (H) 09/26/2014     Current Medications: Current Outpatient Prescriptions  Medication Sig Dispense Refill  . BYSTOLIC 10 MG tablet TAKE 1 TABLET (10 MG TOTAL) BY MOUTH DAILY. 90 tablet 1  . clonazePAM (KLONOPIN) 0.5 MG tablet Take 1 tablet (0.5 mg total) by mouth 3 (three) times daily as needed for anxiety. 90 tablet 2  . [facility]S B-1 100 MG tablet TAKE 1 TABLET EVERY DAY 100 tablet 1  . ezetimibe-simvastatin (VYTORIN) 10-40 MG tablet Take 1 tablet by mouth daily. 90 tablet 3  . FeAsp-B12-FA-C-DSS-SuccAc-Zn (FERIVA 21/7) 75-1 MG TABS Take 1 tablet by mouth daily. 28 tablet 11  . gabapentin (NEURONTIN) 600 MG tablet TAKE 1 TABLET (600 MG TOTAL) BY MOUTH 3 (THREE) TIMES DAILY. 90 tablet 11  . KLOR-CON M20 20 MEQ tablet     . Multiple Vitamin (MULTIVITAMIN) tablet Take 1 tablet by mouth daily. Reported on 09/09/2015    . NEXIUM 40 MG capsule Take 1 capsule (40 mg total) by mouth 2 (two) times daily before a meal. Brand name only 180 capsule 3  . phentermine 37.5 MG capsule Take 1 capsule (37.5 mg total)  by mouth every morning. 30 capsule 2  . QUEtiapine (SEROQUEL XR) 300 MG 24 hr tablet Take 1 tablet (300 mg total) by mouth daily. 30 tablet 2  . traZODone (DESYREL) 100 MG tablet Take 1 tablet (100 mg total) by mouth at bedtime. 30 tablet 2  . Vitamin D, Ergocalciferol, (DRISDOL) 50000 units CAPS capsule TAKE ONE CAPSULE BY MOUTH EVERY 7 DAYS 12 capsule 3   No current  facility-administered medications for this visit.     Neurologic: Headache: No Seizure: No Paresthesias: No  Musculoskeletal: Strength & Muscle Tone: within normal limits Gait & Station: normal Patient leans: N/A  Psychiatric Specialty Exam: ROS  There were no vitals taken for this visit.There is no height or weight on file to calculate BMI.  General Appearance: Casual  Eye Contact:  Good  Speech:  Clear and Coherent  Volume:  Normal  Mood:  Anxious  Affect:  Appropriate  Thought Process:  Goal Directed  Orientation:  Full (Time, Place, and Person)  Thought Content: WDL and Logical   Suicidal Thoughts:  No  Homicidal Thoughts:  No  Memory:  Immediate;   Good Recent;   Good Remote;   Good  Judgement:  Good  Insight:  Good  Psychomotor Activity:  Normal  Concentration:  Concentration: Good and Attention Span: Good  Recall:  Good  Fund of Knowledge: Good  Language: Good  Akathisia:  No  Handed:  Right  AIMS (if indicated):  0  Assets:  Communication Skills Desire for Improvement Housing Physical Health Resilience Social Support  ADL's:  Intact  Cognition: WNL  Sleep:  fair   Assessment: Bipolar disorder type I.  Plan: Overall patient is doing better except for insomnia.  I recommended to try trazodone 150 mg at bedtime.  We have requested to increase the trazodone by calling her phone few days ago but she never received the message.  She agreed to try trazodone 150 mg at bedtime.  Continue Seroquel 200 mg at bedtime and Klonopin 0.5 mg 3 times a day.  Discussed medication side effects and  benefits.  Patient is in process of getting sleeve procedure for weight loss. recommended to call us back if she has any question or any concern.  Follow-up in 3 months. Kennedy Brines T., MD 07/07/2016, 1:33 PM

## 2016-07-10 ENCOUNTER — Other Ambulatory Visit (HOSPITAL_COMMUNITY): Payer: Self-pay | Admitting: Psychiatry

## 2016-07-10 ENCOUNTER — Other Ambulatory Visit: Payer: Self-pay | Admitting: Internal Medicine

## 2016-07-10 DIAGNOSIS — E785 Hyperlipidemia, unspecified: Secondary | ICD-10-CM

## 2016-07-10 DIAGNOSIS — M549 Dorsalgia, unspecified: Secondary | ICD-10-CM

## 2016-07-10 DIAGNOSIS — F3160 Bipolar disorder, current episode mixed, unspecified: Secondary | ICD-10-CM

## 2016-07-10 DIAGNOSIS — F319 Bipolar disorder, unspecified: Secondary | ICD-10-CM

## 2016-07-16 ENCOUNTER — Other Ambulatory Visit (HOSPITAL_COMMUNITY): Payer: Self-pay | Admitting: Psychiatry

## 2016-07-17 DIAGNOSIS — I10 Essential (primary) hypertension: Secondary | ICD-10-CM | POA: Diagnosis not present

## 2016-07-17 DIAGNOSIS — E119 Type 2 diabetes mellitus without complications: Secondary | ICD-10-CM | POA: Diagnosis not present

## 2016-07-17 DIAGNOSIS — K912 Postsurgical malabsorption, not elsewhere classified: Secondary | ICD-10-CM | POA: Diagnosis not present

## 2016-07-17 DIAGNOSIS — Z09 Encounter for follow-up examination after completed treatment for conditions other than malignant neoplasm: Secondary | ICD-10-CM | POA: Diagnosis not present

## 2016-07-23 ENCOUNTER — Other Ambulatory Visit: Payer: Self-pay | Admitting: Surgery

## 2016-07-23 NOTE — Progress Notes (Signed)
Please place orders in EPIC as patient is being scheduled for a pre-op appointment! Thank you! 

## 2016-08-03 ENCOUNTER — Encounter: Payer: Medicare Other | Attending: Surgery | Admitting: Skilled Nursing Facility1

## 2016-08-03 DIAGNOSIS — Z01818 Encounter for other preprocedural examination: Secondary | ICD-10-CM | POA: Diagnosis not present

## 2016-08-03 DIAGNOSIS — Z9884 Bariatric surgery status: Secondary | ICD-10-CM | POA: Insufficient documentation

## 2016-08-03 DIAGNOSIS — Z713 Dietary counseling and surveillance: Secondary | ICD-10-CM | POA: Diagnosis not present

## 2016-08-03 DIAGNOSIS — E669 Obesity, unspecified: Secondary | ICD-10-CM

## 2016-08-04 NOTE — Progress Notes (Signed)
  Pre-Operative Nutrition Class:  Appt start time: 0375   End time:  1830.  Patient was seen on 06/11 for Pre-Operative Bariatric Surgery [facility].   Surgery date: 08/17/16 Surgery type: Revision to Sleeve Gastrectomy [facility]: 313.1 lb Weight today: 306.8 lb  TANITA  BODY COMP RESULTS     BMI (kg/m^2)    Fat Mass (lbs)    Fat Free Mass (lbs)    Total Body Water (lbs)    Samples given per MNT protocol. Patient educated on appropriate usage: Bariatric Advantage Multivitamin Lot # [id number] Exp: 06/19  Celebrate Vitamins Multivitamin Lot # [id number] Exp: 06/19  Celebrate Vitamins Calcium Citrate Lot # [id number] Exp: 04/19  Renee Pain Protein Shake Lot # [id number] Exp: 12/27/16  The following the learning objectives were met by the patient during this course:  Identify Pre-Op Dietary Goals and will begin 2 weeks pre-operatively  Identify appropriate sources of fluids and proteins   State protein recommendations and appropriate sources pre and post-operatively  Identify Post-Operative Dietary Goals and will follow for 2 weeks post-operatively  Identify appropriate multivitamin and calcium sources  Describe the need for physical activity post-operatively and will follow MD recommendations  State when to call healthcare provider regarding medication questions or post-operative complications  Handouts given during class include:  Pre-Op Bariatric Surgery Diet Handout  Protein Shake Handout  Post-Op Bariatric Surgery Nutrition Handout  [facility]EL[facility] Program Information Flyer  Support Group Information Flyer  [facility] Bariatric Supplements Price List  Follow-Up Plan: Patient will follow-up at [facility] 2 weeks post operatively for diet advancement per MD.

## 2016-08-07 IMAGING — CR DG CHEST 2V
2 series · 2 of 2 positions shown · non-contrast
Comparison: 07/10/2010.  02/26/2009.

CLINICAL DATA: Cough.

EXAM:
CHEST  2 VIEW

[view not recorded (1 of 2)]
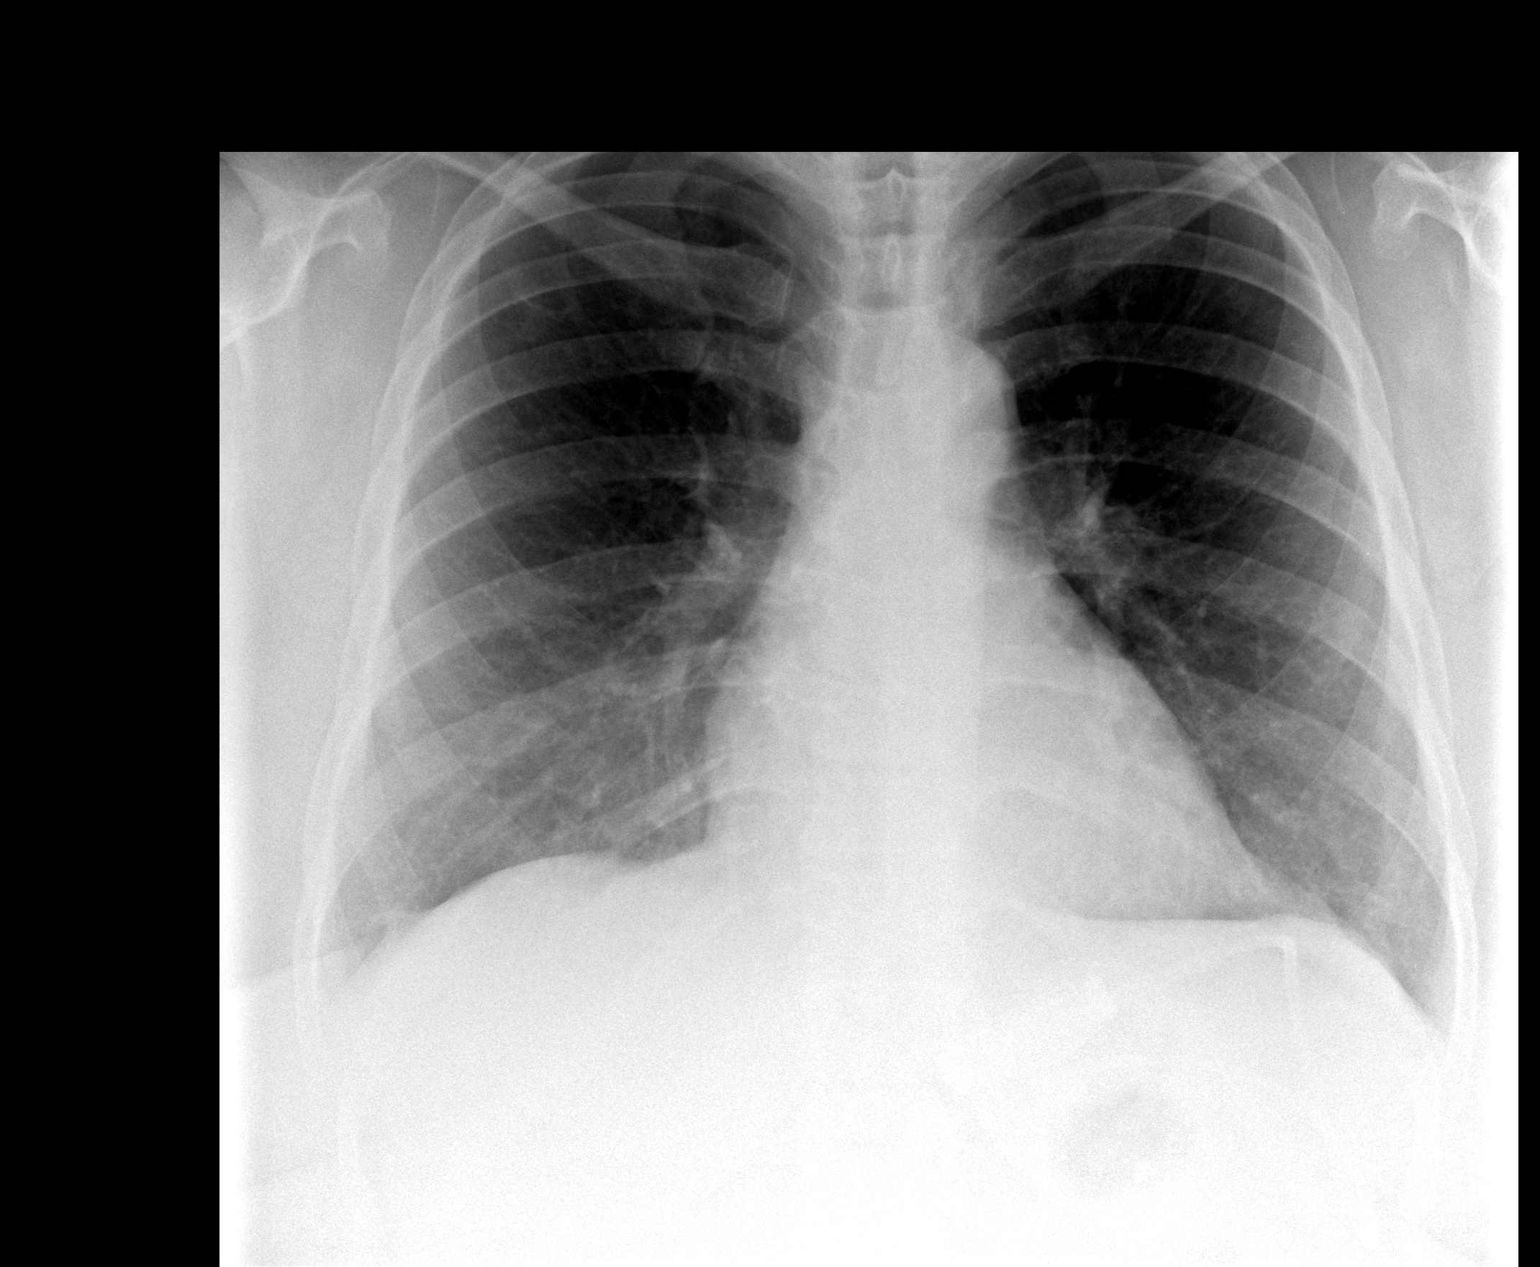

[view not recorded (2 of 2)]
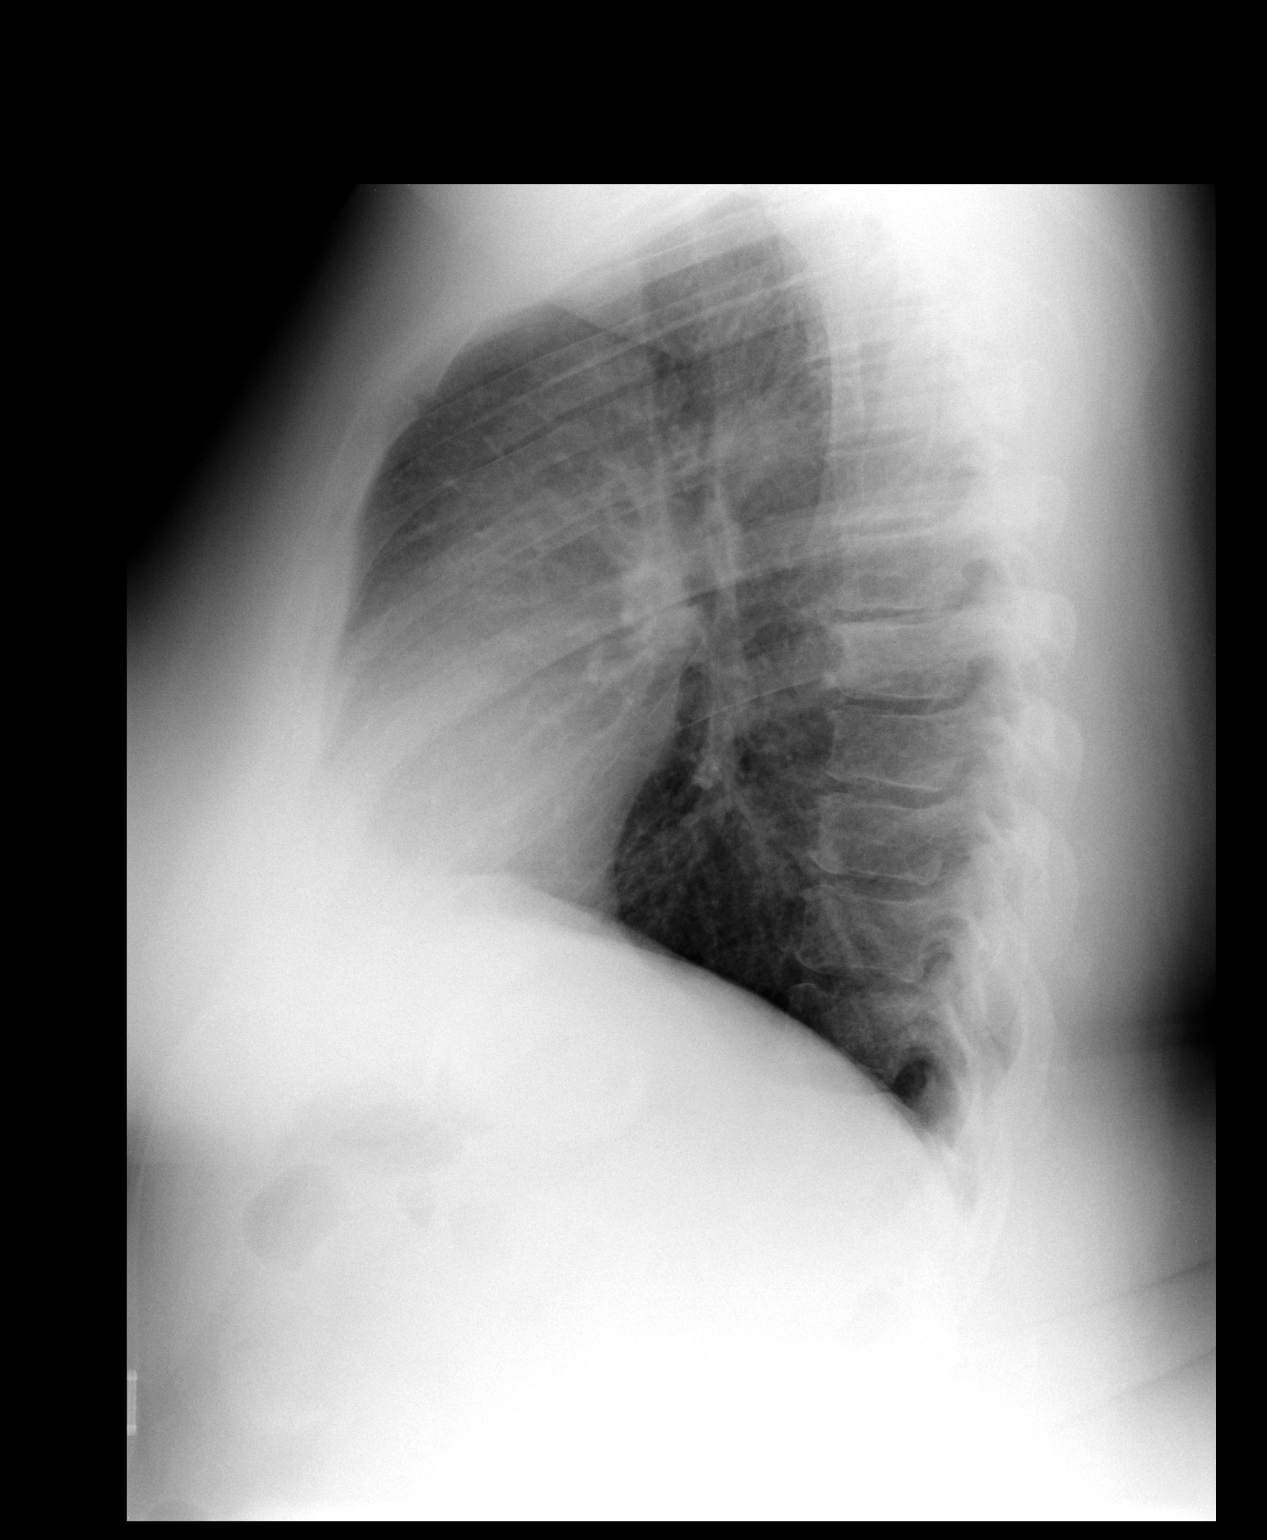

[2 of 2 positions shown; findings below may reference images not displayed]

FINDINGS: Mediastinum hilar structures normal. Low lung volumes with mild
basilar atelectasis. Mild right upper lobe pleural parenchymal
thickening is noted. This is stable consistent with scarring. No
pleural effusion or pneumothorax. No acute bony abnormality.
IMPRESSION: Low lung volumes with mild basilar atelectasis and right upper lobe
pleural parenchymal scarring. Otherwise negative chest.

## 2016-08-10 NOTE — Patient Instructions (Addendum)
Shannon [name]  08/10/2016   Your procedure is scheduled on: 08-17-16   Report to [facility] Main  Entrance Take [place]  Elevators to 3rd floor to  [place] at [place] AM.   Call this number if you have problems the morning of surgery [phone]    Remember: ONLY 1 PERSON MAY GO WITH YOU TO SHORT STAY TO GET  READY MORNING OF [place].  Do not eat food or drink liquids :After Midnight.     Take these medicines the morning of surgery with A SIP OF WATER: Nexium, Bystolic, Clonazepam if needed,   Incentive Spirometer  An incentive spirometer is a tool that can help keep your lungs clear and active. This tool measures how well you are filling your lungs with each breath. Taking long deep breaths may help reverse or decrease the chance of developing breathing (pulmonary) problems (especially infection) following:  A long period of time when you are unable to move or be active. BEFORE THE PROCEDURE   If the spirometer includes an indicator to show your best effort, your nurse or respiratory therapist will set it to a desired goal.  If possible, sit up straight or lean slightly forward. Try not to slouch.  Hold the incentive spirometer in an upright position. INSTRUCTIONS FOR USE  1. Sit on the edge of your bed if possible, or sit up as far as you can in bed or on a chair. 2. Hold the incentive spirometer in an upright position. 3. Breathe out normally. 4. Place the mouthpiece in your mouth and seal your lips tightly around it. 5. Breathe in slowly and as deeply as possible, raising the piston or the ball toward the top of the column. 6. Hold your breath for 3-5 seconds or for as long as possible. Allow the piston or ball to fall to the bottom of the column. 7. Remove the mouthpiece from your mouth and breathe out normally. 8. Rest for a few seconds and repeat Steps 1 through 7 at least 10 times every 1-2 hours when you are awake. Take your time and  take a few normal breaths between deep breaths. 9. The spirometer may include an indicator to show your best effort. Use the indicator as a goal to work toward during each repetition. 10. After each set of 10 deep breaths, practice coughing to be sure your lungs are clear. If you have an incision (the cut made at the time of surgery), support your incision when coughing by placing a pillow or rolled up towels firmly against it. Once you are able to get out of bed, walk around indoors and cough well. You may stop using the incentive spirometer when instructed by your caregiver.  RISKS AND COMPLICATIONS  Take your time so you do not get dizzy or light-headed.  If you are in pain, you may need to take or ask for pain medication before doing incentive spirometry. It is harder to take a deep breath if you are having pain. AFTER USE  Rest and breathe slowly and easily.  It can be helpful to keep track of a log of your progress. Your caregiver can provide you with a simple table to help with this. If you are using the spirometer at home, follow these instructions: [place] IF:   You are having difficultly using the spirometer.  You have trouble using the spirometer as  often as instructed.  Your pain medication is not giving enough relief while using the spirometer.  You develop fever of 100.5 F (38.1 C) or higher. SEEK IMMEDIATE MEDICAL CARE IF:   You cough up bloody sputum that had not been present before.  You develop fever of 102 F (38.9 C) or greater.  You develop worsening pain at or near the incision site. MAKE SURE YOU:   Understand these instructions.  Will watch your condition.  Will get help right away if you are not doing well or get worse. Document Released: 06/22/2006 Document Revised: 05/04/2011 Document Reviewed: 08/23/2006 ExitCare Patient Information 2014 ExitCare, [place].   ________________________________________________________________________                                  Dennis Bast may not have any metal on your body including hair pins and              piercings  Do not wear jewelry, make-up, lotions, powders or perfumes, deodorant             Do not wear nail polish.  Do not shave  48 hours prior to surgery.                Do not bring valuables to the hospital. [employer].  Contacts, dentures or bridgework may not be worn into surgery.  Leave suitcase in the car. After surgery it may be brought to your room.             Practice coughing and deep breathing exercises, leg exercises                   Please read over the following fact sheets you were given: _____________________________________________________________________             [facility] - Preparing for Surgery Before surgery, you can play an important role.  Because skin is not sterile, your skin needs to be as free of germs as possible.  You can reduce the number of germs on your skin by washing with CHG (chlorahexidine gluconate) soap before surgery.  CHG is an antiseptic cleaner which kills germs and bonds with the skin to continue killing germs even after washing. Please DO NOT use if you have an allergy to CHG or antibacterial soaps.  If your skin becomes reddened/irritated stop using the CHG and inform your nurse when you arrive at Short Stay. Do not shave (including legs and underarms) for at least 48 hours prior to the first CHG shower.  You may shave your face/neck. Please follow these instructions carefully:  1.  Shower with CHG Soap the night before surgery and the  morning of Surgery.  2.  If you choose to wash your hair, wash your hair first as usual with your  normal  shampoo.  3.  After you shampoo, rinse your hair and body thoroughly to remove the  shampoo.                           4.  Use CHG as you would any other liquid soap.  You can apply chg directly  to the skin and wash  Gently  with a scrungie or clean washcloth.  5.  Apply the CHG Soap to your body ONLY FROM THE NECK DOWN.   Do not use on face/ open                           Wound or open sores. Avoid contact with eyes, ears mouth and genitals (private parts).                       Wash face,  Genitals (private parts) with your normal soap.             6.  Wash thoroughly, paying special attention to the area where your surgery  will be performed.  7.  Thoroughly rinse your body with warm water from the neck down.  8.  DO NOT shower/wash with your normal soap after using and rinsing off  the CHG Soap.                9.  Pat yourself dry with a clean towel.            10.  Wear clean pajamas.            11.  Place clean sheets on your bed the night of your first shower and do not  sleep with pets. Day of Surgery : Do not apply any lotions/deodorants the morning of surgery.  Please wear clean clothes to the hospital/surgery center.  FAILURE TO FOLLOW THESE INSTRUCTIONS MAY RESULT IN THE CANCELLATION OF YOUR SURGERY PATIENT SIGNATURE_________________________________  NURSE SIGNATURE__________________________________  ________________________________________________________________________

## 2016-08-12 ENCOUNTER — Encounter (HOSPITAL_COMMUNITY): Payer: Self-pay

## 2016-08-12 ENCOUNTER — Other Ambulatory Visit (HOSPITAL_COMMUNITY): Payer: Self-pay | Admitting: Psychiatry

## 2016-08-12 ENCOUNTER — Other Ambulatory Visit: Payer: Self-pay | Admitting: Internal Medicine

## 2016-08-12 ENCOUNTER — Encounter (HOSPITAL_COMMUNITY)
Admission: RE | Admit: 2016-08-12 | Discharge: 2016-08-12 | Disposition: A | Discharge status: Home / Self Care | Payer: Medicare Other | Source: Ambulatory Visit | Attending: Surgery | Admitting: Surgery

## 2016-08-12 DIAGNOSIS — Z01810 Encounter for preprocedural cardiovascular examination: Secondary | ICD-10-CM | POA: Insufficient documentation

## 2016-08-12 DIAGNOSIS — G4730 Sleep apnea, unspecified: Secondary | ICD-10-CM

## 2016-08-12 DIAGNOSIS — Z01812 Encounter for preprocedural laboratory examination: Secondary | ICD-10-CM | POA: Insufficient documentation

## 2016-08-12 DIAGNOSIS — F319 Bipolar disorder, unspecified: Secondary | ICD-10-CM

## 2016-08-12 HISTORY — DX: Sleep apnea, unspecified: G47.30

## 2016-08-12 HISTORY — DX: Gastro-esophageal reflux disease without esophagitis: K21.9

## 2016-08-12 LAB — CBC WITH DIFFERENTIAL/PLATELET
Basophils Absolute: 0 10*3/uL (ref 0.0–0.1)
Basophils Relative: 0 %
Eosinophils Absolute: 0.2 10*3/uL (ref 0.0–0.7)
Eosinophils Relative: 2 %
HCT: 39.3 % (ref 36.0–46.0)
Hemoglobin: 13.5 g/dL (ref 12.0–15.0)
Lymphocytes Relative: 26 %
Lymphs Abs: 2 10*3/uL (ref 0.7–4.0)
MCH: 28.1 pg (ref 26.0–34.0)
MCHC: 34.4 g/dL (ref 30.0–36.0)
MCV: 81.7 fL (ref 78.0–100.0)
Monocytes Absolute: 0.3 10*3/uL (ref 0.1–1.0)
Monocytes Relative: 4 %
Neutro Abs: 5.2 10*3/uL (ref 1.7–7.7)
Neutrophils Relative %: 68 %
Platelets: 257 10*3/uL (ref 150–400)
RBC: 4.81 MIL/uL (ref 3.87–5.11)
RDW: 15.8 % — ABNORMAL HIGH (ref 11.5–15.5)
WBC: 7.7 10*3/uL (ref 4.0–10.5)

## 2016-08-12 LAB — COMPREHENSIVE METABOLIC PANEL
ALT: 41 U/L (ref 14–54)
AST: 26 U/L (ref 15–41)
Albumin: 3.6 g/dL (ref 3.5–5.0)
Alkaline Phosphatase: 89 U/L (ref 38–126)
Anion gap: 6 (ref 5–15)
BUN: 9 mg/dL (ref 6–20)
CO2: 25 mmol/L (ref 22–32)
Calcium: 8.4 mg/dL — ABNORMAL LOW (ref 8.9–10.3)
Chloride: 108 mmol/L (ref 101–111)
Creatinine, Ser: 0.68 mg/dL (ref 0.44–1.00)
GFR calc Af Amer: >60 mL/min (ref >60)
GFR calc non Af Amer: >60 mL/min (ref >60)
Glucose, Bld: 104 mg/dL — ABNORMAL HIGH (ref 65–99)
Potassium: 4 mmol/L (ref 3.5–5.1)
Sodium: 139 mmol/L (ref 135–145)
Total Bilirubin: 0.8 mg/dL (ref 0.3–1.2)
Total Protein: 7.5 g/dL (ref 6.5–8.1)

## 2016-08-12 NOTE — Progress Notes (Signed)
.......  [birth year]  patient's date of birth is  Your patient has screened at an elevated risk for obstructive sleep apnea using the STOP-Bang tool during a presurgical visit. A score of 5 or greater is an elevated risk.

## 2016-08-13 NOTE — Progress Notes (Signed)
Final EKG done 08/12/16-epic

## 2016-08-16 NOTE — H&P (Signed)
Shannon [name]  Location: [facility] Surgery Patient #: [id number] DOB: 1965-03-06 Divorced / Language: English / Race: Black or African American Female  History of Present Illness   The patient is a 51 year old female is here for LapBand followup.  Her PCP is Dr. Sharlyne [name].  She comes with her husband, but his [place] skills are limited  She is here for a conversion from a lap band to a sleeve gastrectomy . She said that she has been trying to contact our office about proceeding with surgery. She has done everything except look at the [facility] video. We had to show her how to do this. So it looks like she is ready to talk about going ahead with surgery. She has lost a few pounds since I last saw her - we went over the disappointing weight gain. We went over the operation again. She asked about "reflux". She has a friend who has had significant reflux. I explained that I will look at the [facility] seen on UGI. We talked about failing to loose weight, even with another operation. The surgery is a "tool" to help her loose weight ... I think that she's still trying to understand this.  I also expressed concern to her about her weight gain over the last year.  She was 285 pounds on 05/31/2015 - so she is up 30+ pounds over a year.  This increases the risk of her surgery and makes me worry about her understanding of the diet.  Per the [facility], the patient is a candidate for bariatric surgery. The patient attended our initial information session and reviewed the types of bariatric surgery.  The patient is interested in conversion to the sleeve gastrectomy.  She understands that conversion surgery carries a higher risk of complication.  I discussed with the patient the indications and risks of bariatric surgery. The potential risks of surgery include, but are not limited to, bleeding, infection, leak from the bowel, DVT and PE, open surgery, long term  nutrition consequences, and death. The patient understands the importance of compliance and long term follow-up with our group after surgery.  Shannon [name] - 07/02/2016 - normal UGI - 07/02/2016 - small HH She did see nutrition on 03/30/2016 - at that time she was still eating sweets and fried foods. Psych eval - saw Shannon [name] on 07/07/2016  She is interested in a conversion of the lap band to a lap sleeve gastrectomy. She thinks that she choose the wrong operation, but I pointed out to her that when we did her lap band, we were not doing lap sleeve gastrectomies. Looking over her record. She did do well and got her weight down to about 210 pounds in 2013 and 2014. She said that she is eating "healthier" foods, she is off sweet milk, and she is eating more fish and lamb. She is also tyring to exercise more (though it does not sound like this is a main conerstone of her wieght loss plan  Diagnoses include: 1. AP large lap band - 07/05/2010 - Shannon [name] We had a discussion about how hard it is to lose weight, even with the lap band.  She needs to continue her diet, continue paying attention to her portion size, and continue exercising. Weight loss (with or without surgery is difficult (or else everyone would be thin).  Past Medical History: 1. Bipolar disease. Followed by [facility] mental health. Last saw Shannon [name] on 07/07/2016. She pointed out  he is Muslim. On Seroquel and Klonopin 2. Hypertension. 3. Was smoking 4 to 6 cigarettes per day She said that she stopped smoking. 4. Chronic back pain. She is on no pain meds right now. She is not actively seeing anyone for her back 5. Hypercholesterolemia 6. She had right shoulder surgery by Dr. Mardelle [name] in 06/2013 7. Partial hysterectomy in 2010 8. She has been on chronic disability since about 2013.  Social History: Married - 11/19/2014. Her husband is from  [place] and is the of [place] tribe. It sounds like he has poor [place] skills. I have still asked her to bring him to her pre op visit. She is now Muslim. She has kept her married name. Has 2 chldren: 21 yo daughter Shannon [name]) and 67 yo son.   Allergies Shannon [name]; 07/17/2016 10:40 AM) No Known Drug Allergies 09/06/2014 No Known Allergies 05/01/2016  Medication History Shannon [name]; 07/17/2016 10:41 AM) ClonazePAM (0.5MG  Tablet, Oral) Active. CVS B-1 (100MG  Tablet, Oral) Active. Shingrix (50MCG For Suspension, Intramuscular) Active. TraZODone HCl (50MG  Tablet, Oral) Active. QUEtiapine Fumarate (25MG  Tablet, Oral) Active. Bystolic (10MG  Tablet, Oral) Active. Vitamin D (Ergocalciferol) (50000UNIT Capsule, Oral) Active. ClonazePAM (1MG  Tablet, Oral) Active. CVS D3 (2000UNIT Capsule, Oral) Active. NexIUM (40MG  Capsule DR, Oral) Active. SEROquel XR (300MG  Tablet ER 24HR, Oral) Active. Vytorin (10-40MG  Tablet, Oral) Active. Medications Reconciled  Vitals Shannon [name] [name]; 07/17/2016 10:41 AM) 07/17/2016 10:41 AM Weight: 309.2 lb Height: 63in Body Surface Area: 2.33 m Body Mass Index: 54.77 kg/m  Temp.: 98.48F  Pulse: 98 (Regular)  BP: 140/80 (Sitting, Left Arm, Standard)   Physical Exam  General: Obese WN AA F alert and generally healthy appearing. HEENT: Normal. Pupils equal.  Neck: Supple. No mass. No thyroid mass.  Lymph Nodes: No supraclavicular or cervical nodes.  Breasts: Right - no mass Left - tattoo in upper breast. No mass.  Lungs: Clear to auscultation and symmetric breath sounds. Heart: RRR. No murmur or rub.  Abdomen: Soft. No tenderness. No hernia. Normal bowel sounds. Port in [place].  She is more apple than pear. She has a pannus. She has a lot of central weight. I again encouraged her to continue to loose weight.  Extremities: Good strength and ROM in upper and lower  extremities.   Assessment & Plan  1.  HISTORY OF LAPAROSCOPIC ADJUSTABLE GASTRIC BANDING (Z98.84)  Story: AP large lap band - 07/05/2010 - Shannon [name]  Plan:  1) She has completed the pre op evaluation.  t.  2)  She is scheduled for conversion to sleeve gastrectomy on 08/17/2016.  2.  BIPOLAR DISEASE, CHRONIC (F31.9)  Followed by [facility] mental health. Last saw Shannon [name] on 07/07/2016. She pointed out he is Muslim. On Seroquel and Klonopin3.  HYPERTENSION, ESSENTIAL (I10)  3. Hypertension. 4. Was smoking 4 to 6 cigarettes per day She said that she stopped smoking. 5. Chronic back pain. She is on no pain meds right now. She is not actively seeing anyone for her back 6. Hypercholesterolemia 7. Partial hysterectomy in 2010 8. She has been on chronic disability since about 2013.   Alphonsa Overall, MD, [facility] Surgery Pager: [phone] Office phone:  [phone]

## 2016-08-17 ENCOUNTER — Encounter (HOSPITAL_COMMUNITY): Payer: Self-pay | Admitting: *Deleted

## 2016-08-17 ENCOUNTER — Inpatient Hospital Stay (HOSPITAL_COMMUNITY): Payer: Medicare Other | Admitting: Certified Registered Nurse Anesthetist

## 2016-08-17 ENCOUNTER — Observation Stay (HOSPITAL_COMMUNITY)
Admission: RE | Admit: 2016-08-17 | Discharge: 2016-08-18 | Disposition: A | Discharge status: Home / Self Care | Payer: Medicare Other | Source: Ambulatory Visit | Attending: Surgery | Admitting: Surgery

## 2016-08-17 ENCOUNTER — Encounter (HOSPITAL_COMMUNITY)
Admission: RE | Disposition: A | Discharge status: Home / Self Care | Payer: Self-pay | Source: Ambulatory Visit | Attending: Surgery

## 2016-08-17 DIAGNOSIS — K219 Gastro-esophageal reflux disease without esophagitis: Secondary | ICD-10-CM | POA: Diagnosis not present

## 2016-08-17 DIAGNOSIS — Z6841 Body Mass Index (BMI) 40.0 and over, adult: Secondary | ICD-10-CM | POA: Diagnosis not present

## 2016-08-17 DIAGNOSIS — E7800 Pure hypercholesterolemia, unspecified: Secondary | ICD-10-CM | POA: Diagnosis not present

## 2016-08-17 DIAGNOSIS — I10 Essential (primary) hypertension: Secondary | ICD-10-CM | POA: Insufficient documentation

## 2016-08-17 DIAGNOSIS — F319 Bipolar disorder, unspecified: Secondary | ICD-10-CM | POA: Diagnosis not present

## 2016-08-17 DIAGNOSIS — D214 Benign neoplasm of connective and other soft tissue of abdomen: Secondary | ICD-10-CM | POA: Insufficient documentation

## 2016-08-17 DIAGNOSIS — F17210 Nicotine dependence, cigarettes, uncomplicated: Secondary | ICD-10-CM | POA: Diagnosis not present

## 2016-08-17 DIAGNOSIS — K9509 Other complications of gastric band procedure: Secondary | ICD-10-CM | POA: Insufficient documentation

## 2016-08-17 DIAGNOSIS — G8929 Other chronic pain: Secondary | ICD-10-CM | POA: Insufficient documentation

## 2016-08-17 DIAGNOSIS — R6884 Jaw pain: Secondary | ICD-10-CM | POA: Diagnosis not present

## 2016-08-17 DIAGNOSIS — Z9884 Bariatric surgery status: Secondary | ICD-10-CM | POA: Insufficient documentation

## 2016-08-17 DIAGNOSIS — M1990 Unspecified osteoarthritis, unspecified site: Secondary | ICD-10-CM | POA: Diagnosis not present

## 2016-08-17 DIAGNOSIS — K2950 Unspecified chronic gastritis without bleeding: Secondary | ICD-10-CM | POA: Diagnosis not present

## 2016-08-17 DIAGNOSIS — Z79899 Other long term (current) drug therapy: Secondary | ICD-10-CM | POA: Diagnosis not present

## 2016-08-17 DIAGNOSIS — M549 Dorsalgia, unspecified: Secondary | ICD-10-CM | POA: Diagnosis not present

## 2016-08-17 DIAGNOSIS — J449 Chronic obstructive pulmonary disease, unspecified: Secondary | ICD-10-CM | POA: Diagnosis not present

## 2016-08-17 DIAGNOSIS — Z90710 Acquired absence of both cervix and uterus: Secondary | ICD-10-CM | POA: Diagnosis not present

## 2016-08-17 HISTORY — PX: LAPAROSCOPIC GASTRIC BAND REMOVAL WITH LAPAROSCOPIC GASTRIC SLEEVE RESECTION: SHX6498

## 2016-08-17 LAB — HEMOGLOBIN AND HEMATOCRIT, BLOOD
HCT: 39.8 % (ref 36.0–46.0)
Hemoglobin: 13.9 g/dL (ref 12.0–15.0)

## 2016-08-17 LAB — PREGNANCY, URINE: Preg Test, Ur: NEGATIVE

## 2016-08-17 SURGERY — LAPAROSCOPIC GASTRIC BAND REMOVAL WITH LAPAROSCOPIC GASTRIC SLEEVE RESECTION
Anesthesia: General

## 2016-08-17 MED ORDER — KETAMINE HCL 10 MG/ML IJ SOLN
INTRAMUSCULAR | Status: DC | PRN
  Administered 2016-08-17: 28 mg via INTRAVENOUS

## 2016-08-17 MED ORDER — BUPIVACAINE-EPINEPHRINE (PF) 0.25% -1:200000 IJ SOLN
INTRAMUSCULAR | Status: AC
  Filled 2016-08-17: qty 30

## 2016-08-17 MED ORDER — CHLORHEXIDINE GLUCONATE 4 % EX LIQD: 60.0000 mL | Freq: Once | CUTANEOUS | Status: DC

## 2016-08-17 MED ORDER — MEPERIDINE HCL 50 MG/ML IJ SOLN: 6.2500 mg | INTRAMUSCULAR | Status: DC | PRN

## 2016-08-17 MED ORDER — ROCURONIUM BROMIDE 50 MG/5ML IV SOSY
PREFILLED_SYRINGE | INTRAVENOUS | Status: DC | PRN
  Administered 2016-08-17: 20 mg via INTRAVENOUS
  Administered 2016-08-17: 10 mg via INTRAVENOUS
  Administered 2016-08-17: 30 mg via INTRAVENOUS
  Administered 2016-08-17: 50 mg via INTRAVENOUS

## 2016-08-17 MED ORDER — PHENYLEPHRINE HCL 10 MG/ML IJ SOLN
INTRAMUSCULAR | Status: AC
  Filled 2016-08-17: qty 1

## 2016-08-17 MED ORDER — ACETAMINOPHEN 325 MG PO TABS: 650.0000 mg | ORAL_TABLET | ORAL | Status: DC | PRN

## 2016-08-17 MED ORDER — SUCCINYLCHOLINE CHLORIDE 200 MG/10ML IV SOSY
PREFILLED_SYRINGE | INTRAVENOUS | Status: DC | PRN
  Administered 2016-08-17: 120 mg via INTRAVENOUS

## 2016-08-17 MED ORDER — LIDOCAINE 2% (20 MG/ML) 5 ML SYRINGE
INTRAMUSCULAR | Status: AC
  Filled 2016-08-17: qty 10

## 2016-08-17 MED ORDER — PROPOFOL 10 MG/ML IV BOLUS
INTRAVENOUS | Status: AC
  Filled 2016-08-17: qty 40

## 2016-08-17 MED ORDER — GABAPENTIN 300 MG PO CAPS
300.0000 mg | ORAL_CAPSULE | ORAL | Status: AC
  Administered 2016-08-17: 300 mg via ORAL
  Filled 2016-08-17: qty 1

## 2016-08-17 MED ORDER — OXYCODONE HCL 5 MG PO TABS: 5.0000 mg | ORAL_TABLET | Freq: Once | ORAL | Status: DC | PRN

## 2016-08-17 MED ORDER — HEPARIN SODIUM (PORCINE) 5000 UNIT/ML IJ SOLN
5000.0000 [IU] | Freq: Three times a day (TID) | INTRAMUSCULAR | Status: DC
  Administered 2016-08-17 – 2016-08-18 (×3): 5000 [IU] via SUBCUTANEOUS
  Filled 2016-08-17 (×3): qty 1

## 2016-08-17 MED ORDER — SCOPOLAMINE 1 MG/3DAYS TD PT72
MEDICATED_PATCH | TRANSDERMAL | Status: AC
  Filled 2016-08-17: qty 1

## 2016-08-17 MED ORDER — FENTANYL CITRATE (PF) 250 MCG/5ML IJ SOLN
INTRAMUSCULAR | Status: DC | PRN
  Administered 2016-08-17: 100 ug via INTRAVENOUS
  Administered 2016-08-17 (×3): 50 ug via INTRAVENOUS

## 2016-08-17 MED ORDER — BUPIVACAINE LIPOSOME 1.3 % IJ SUSP
20.0000 mL | Freq: Once | INTRAMUSCULAR | Status: AC
  Administered 2016-08-17: 20 mL
  Filled 2016-08-17: qty 20

## 2016-08-17 MED ORDER — CEFOTETAN DISODIUM-DEXTROSE 2-2.08 GM-% IV SOLR
INTRAVENOUS | Status: AC
  Filled 2016-08-17: qty 50

## 2016-08-17 MED ORDER — SUGAMMADEX SODIUM 500 MG/5ML IV SOLN
INTRAVENOUS | Status: AC
  Filled 2016-08-17: qty 5

## 2016-08-17 MED ORDER — OXYCODONE HCL 5 MG/5ML PO SOLN
5.0000 mg | Freq: Once | ORAL | Status: DC | PRN
  Filled 2016-08-17: qty 5

## 2016-08-17 MED ORDER — HEPARIN SODIUM (PORCINE) 5000 UNIT/ML IJ SOLN
5000.0000 [IU] | INTRAMUSCULAR | Status: AC
  Administered 2016-08-17: 5000 [IU] via SUBCUTANEOUS
  Filled 2016-08-17: qty 1

## 2016-08-17 MED ORDER — ROCURONIUM BROMIDE 50 MG/5ML IV SOSY
PREFILLED_SYRINGE | INTRAVENOUS | Status: AC
  Filled 2016-08-17: qty 5

## 2016-08-17 MED ORDER — PREMIER PROTEIN SHAKE
2.0000 [oz_av] | ORAL | Status: DC
  Administered 2016-08-18 (×5): 2 [oz_av] via ORAL

## 2016-08-17 MED ORDER — LIDOCAINE 2% (20 MG/ML) 5 ML SYRINGE
INTRAMUSCULAR | Status: AC
  Filled 2016-08-17: qty 5

## 2016-08-17 MED ORDER — APREPITANT 40 MG PO CAPS
40.0000 mg | ORAL_CAPSULE | ORAL | Status: AC
  Administered 2016-08-17: 40 mg via ORAL
  Filled 2016-08-17: qty 1

## 2016-08-17 MED ORDER — PROMETHAZINE HCL 25 MG/ML IJ SOLN: 6.2500 mg | INTRAMUSCULAR | Status: DC | PRN

## 2016-08-17 MED ORDER — LACTATED RINGERS IV SOLN
INTRAVENOUS | Status: DC | PRN
  Administered 2016-08-17 (×2): via INTRAVENOUS

## 2016-08-17 MED ORDER — PROPOFOL 10 MG/ML IV BOLUS
INTRAVENOUS | Status: DC | PRN
  Administered 2016-08-17: 270 mg via INTRAVENOUS

## 2016-08-17 MED ORDER — ONDANSETRON HCL 4 MG/2ML IJ SOLN
4.0000 mg | INTRAMUSCULAR | Status: DC | PRN
  Administered 2016-08-17: 4 mg via INTRAVENOUS
  Filled 2016-08-17: qty 2

## 2016-08-17 MED ORDER — KETAMINE HCL 10 MG/ML IJ SOLN
INTRAMUSCULAR | Status: AC
  Filled 2016-08-17: qty 1

## 2016-08-17 MED ORDER — LIDOCAINE 2% (20 MG/ML) 5 ML SYRINGE
INTRAMUSCULAR | Status: DC | PRN
  Administered 2016-08-17: 100 mg via INTRAVENOUS

## 2016-08-17 MED ORDER — MORPHINE SULFATE (PF) 2 MG/ML IV SOLN
1.0000 mg | INTRAVENOUS | Status: DC | PRN
  Administered 2016-08-17 – 2016-08-18 (×2): 2 mg via INTRAVENOUS
  Filled 2016-08-17 (×2): qty 1

## 2016-08-17 MED ORDER — SUCCINYLCHOLINE CHLORIDE 200 MG/10ML IV SOSY
PREFILLED_SYRINGE | INTRAVENOUS | Status: AC
  Filled 2016-08-17: qty 10

## 2016-08-17 MED ORDER — EPHEDRINE 5 MG/ML INJ
INTRAVENOUS | Status: AC
  Filled 2016-08-17: qty 10

## 2016-08-17 MED ORDER — ACETAMINOPHEN 160 MG/5ML PO SOLN
325.0000 mg | ORAL | Status: DC | PRN
  Administered 2016-08-17: 650 mg via ORAL
  Filled 2016-08-17: qty 20.3

## 2016-08-17 MED ORDER — HYDROMORPHONE HCL 1 MG/ML IJ SOLN
INTRAMUSCULAR | Status: AC
  Filled 2016-08-17: qty 0.5

## 2016-08-17 MED ORDER — FENTANYL CITRATE (PF) 250 MCG/5ML IJ SOLN
INTRAMUSCULAR | Status: AC
  Filled 2016-08-17: qty 5

## 2016-08-17 MED ORDER — OXYCODONE HCL 5 MG/5ML PO SOLN
5.0000 mg | ORAL | Status: DC | PRN
  Administered 2016-08-17: 5 mg via ORAL
  Administered 2016-08-18 (×2): 10 mg via ORAL
  Filled 2016-08-17: qty 5
  Filled 2016-08-17 (×2): qty 10

## 2016-08-17 MED ORDER — PHENYLEPHRINE HCL 10 MG/ML IJ SOLN
INTRAMUSCULAR | Status: DC | PRN
  Administered 2016-08-17 (×3): 80 ug via INTRAVENOUS

## 2016-08-17 MED ORDER — LIDOCAINE 2% (20 MG/ML) 5 ML SYRINGE
INTRAMUSCULAR | Status: DC | PRN
  Administered 2016-08-17: 1.5 mg/kg/h via INTRAVENOUS

## 2016-08-17 MED ORDER — TISSEEL VH 10 ML EX KIT
PACK | CUTANEOUS | Status: AC
  Filled 2016-08-17: qty 1

## 2016-08-17 MED ORDER — LACTATED RINGERS IV SOLN: INTRAVENOUS | Status: DC

## 2016-08-17 MED ORDER — ONDANSETRON HCL 4 MG/2ML IJ SOLN
INTRAMUSCULAR | Status: AC
  Filled 2016-08-17: qty 2

## 2016-08-17 MED ORDER — DEXAMETHASONE SODIUM PHOSPHATE 10 MG/ML IJ SOLN
INTRAMUSCULAR | Status: AC
  Filled 2016-08-17: qty 1

## 2016-08-17 MED ORDER — PANTOPRAZOLE SODIUM 40 MG IV SOLR
40.0000 mg | Freq: Every day | INTRAVENOUS | Status: DC
  Administered 2016-08-17: 40 mg via INTRAVENOUS
  Filled 2016-08-17: qty 40

## 2016-08-17 MED ORDER — SCOPOLAMINE 1 MG/3DAYS TD PT72
1.0000 | MEDICATED_PATCH | TRANSDERMAL | Status: DC
  Administered 2016-08-17: 1.5 mg via TRANSDERMAL
  Filled 2016-08-17: qty 1

## 2016-08-17 MED ORDER — BUPIVACAINE-EPINEPHRINE 0.25% -1:200000 IJ SOLN
INTRAMUSCULAR | Status: DC | PRN
  Administered 2016-08-17: 30 mL

## 2016-08-17 MED ORDER — DEXAMETHASONE SODIUM PHOSPHATE 10 MG/ML IJ SOLN
INTRAMUSCULAR | Status: DC | PRN
  Administered 2016-08-17: 6 mg via INTRAVENOUS

## 2016-08-17 MED ORDER — LACTATED RINGERS IR SOLN
Status: DC | PRN
  Administered 2016-08-17: 1000 mL

## 2016-08-17 MED ORDER — MIDAZOLAM HCL 5 MG/5ML IJ SOLN
INTRAMUSCULAR | Status: DC | PRN
  Administered 2016-08-17: 2 mg via INTRAVENOUS

## 2016-08-17 MED ORDER — TISSEEL VH 10 ML EX KIT
PACK | CUTANEOUS | Status: DC | PRN
  Administered 2016-08-17: 1

## 2016-08-17 MED ORDER — ONDANSETRON HCL 4 MG/2ML IJ SOLN
INTRAMUSCULAR | Status: DC | PRN
  Administered 2016-08-17: 4 mg via INTRAVENOUS

## 2016-08-17 MED ORDER — CEFOTETAN DISODIUM-DEXTROSE 2-2.08 GM-% IV SOLR
2.0000 g | INTRAVENOUS | Status: AC
  Administered 2016-08-17: 2 g via INTRAVENOUS

## 2016-08-17 MED ORDER — MIDAZOLAM HCL 2 MG/2ML IJ SOLN
INTRAMUSCULAR | Status: AC
Start: 2016-08-17 — End: 2016-08-17
  Filled 2016-08-17: qty 2

## 2016-08-17 MED ORDER — ACETAMINOPHEN 500 MG PO TABS
1000.0000 mg | ORAL_TABLET | ORAL | Status: AC
  Administered 2016-08-17: 1000 mg via ORAL
  Filled 2016-08-17: qty 2

## 2016-08-17 MED ORDER — HYDROMORPHONE HCL 1 MG/ML IJ SOLN
0.2500 mg | INTRAMUSCULAR | Status: DC | PRN
  Administered 2016-08-17: 0.5 mg via INTRAVENOUS

## 2016-08-17 MED ORDER — EPHEDRINE SULFATE 50 MG/ML IJ SOLN
INTRAMUSCULAR | Status: DC | PRN
  Administered 2016-08-17 (×4): 10 mg via INTRAVENOUS

## 2016-08-17 MED ORDER — SUGAMMADEX SODIUM 200 MG/2ML IV SOLN
INTRAVENOUS | Status: DC | PRN
  Administered 2016-08-17: 300 mg via INTRAVENOUS

## 2016-08-17 MED ORDER — SODIUM CHLORIDE 0.9 % IV SOLN
INTRAVENOUS | Status: DC | PRN
  Administered 2016-08-17: 25 ug/min via INTRAVENOUS

## 2016-08-17 MED ORDER — PHENYLEPHRINE 40 MCG/ML (10ML) SYRINGE FOR IV PUSH (FOR BLOOD PRESSURE SUPPORT)
PREFILLED_SYRINGE | INTRAVENOUS | Status: AC
  Filled 2016-08-17: qty 10

## 2016-08-17 SURGICAL SUPPLY — 57 items
APPLICATOR COTTON TIP 6IN STRL (MISCELLANEOUS) ×4 IMPLANT
APPLIER CLIP ROT 10 11.4 M/L (STAPLE)
APPLIER CLIP ROT 13.4 12 LRG (CLIP) ×2
BLADE SURG SZ11 CARB STEEL (BLADE) ×2 IMPLANT
CABLE HIGH FREQUENCY MONO STRZ (ELECTRODE) ×2 IMPLANT
CHLORAPREP W/TINT 26ML (MISCELLANEOUS) ×4 IMPLANT
CLIP APPLIE ROT 10 11.4 M/L (STAPLE) IMPLANT
CLIP APPLIE ROT 13.4 12 LRG (CLIP) ×1 IMPLANT
DERMABOND ADVANCED (GAUZE/BANDAGES/DRESSINGS) ×2
DERMABOND ADVANCED .7 DNX12 (GAUZE/BANDAGES/DRESSINGS) ×2 IMPLANT
DEVICE SUTURE ENDOST 10MM (ENDOMECHANICALS) IMPLANT
DEVICE TROCAR PUNCTURE CLOSURE (ENDOMECHANICALS) ×2 IMPLANT
DRAPE UTILITY XL STRL (DRAPES) ×4 IMPLANT
ELECT PENCIL ROCKER SW 15FT (MISCELLANEOUS) ×2 IMPLANT
ELECT REM PT RETURN 15FT ADLT (MISCELLANEOUS) ×2 IMPLANT
GAUZE SPONGE 4X4 12PLY STRL (GAUZE/BANDAGES/DRESSINGS) IMPLANT
GLOVE SURG SIGNA 7.5 PF LTX (GLOVE) ×2 IMPLANT
GOWN SPEC L4 XLG W/TWL (GOWN DISPOSABLE) ×2 IMPLANT
GOWN STRL REUS W/ TWL XL LVL3 (GOWN DISPOSABLE) ×3 IMPLANT
GOWN STRL REUS W/TWL XL LVL3 (GOWN DISPOSABLE) ×3
HOVERMATT SINGLE USE (MISCELLANEOUS) ×2 IMPLANT
IRRIG SUCT STRYKERFLOW 2 WTIP (MISCELLANEOUS) ×2
IRRIGATION SUCT STRKRFLW 2 WTP (MISCELLANEOUS) ×1 IMPLANT
KIT BASIN OR (CUSTOM PROCEDURE TRAY) ×2 IMPLANT
MARKER SKIN DUAL TIP RULER LAB (MISCELLANEOUS) ×2 IMPLANT
NEEDLE SPNL 22GX3.5 QUINCKE BK (NEEDLE) ×2 IMPLANT
PACK UNIVERSAL I (CUSTOM PROCEDURE TRAY) ×2 IMPLANT
POUCH SPECIMEN RETRIEVAL 10MM (ENDOMECHANICALS) IMPLANT
RELOAD STAPLER BLUE 60MM (STAPLE) IMPLANT
RELOAD STAPLER GOLD 60MM (STAPLE) ×3 IMPLANT
RELOAD STAPLER GREEN 60MM (STAPLE) ×2 IMPLANT
SCISSORS LAP 5X35 DISP (ENDOMECHANICALS) ×2 IMPLANT
SEALANT SURGICAL APPL DUAL CAN (MISCELLANEOUS) ×2 IMPLANT
SHEARS HARMONIC ACE PLUS 45CM (MISCELLANEOUS) ×2 IMPLANT
SLEEVE ADV FIXATION 5X100MM (TROCAR) ×2 IMPLANT
SLEEVE GASTRECTOMY 36FR VISIGI (MISCELLANEOUS) ×2 IMPLANT
SOLUTION ANTI FOG 6CC (MISCELLANEOUS) ×2 IMPLANT
SPONGE LAP 18X18 X RAY DECT (DISPOSABLE) ×2 IMPLANT
STAPLER RELOAD BLUE 60MM (STAPLE)
STAPLER RELOAD GOLD 60MM (STAPLE) ×6
STAPLER RELOAD GREEN 60MM (STAPLE) ×4
SUT ETHILON 2 0 PS N (SUTURE) IMPLANT
SUT MNCRL AB 4-0 PS2 18 (SUTURE) ×4 IMPLANT
SUT VICRYL 0 TIES 12 18 (SUTURE) ×2 IMPLANT
SYR 10ML ECCENTRIC (SYRINGE) ×2 IMPLANT
SYR 20CC LL (SYRINGE) ×2 IMPLANT
SYR CONTROL 10ML LL (SYRINGE) ×2 IMPLANT
TOWEL OR NON WOVEN STRL DISP B (DISPOSABLE) ×2 IMPLANT
TRAY FOLEY CATH SILVER 14FR (SET/KITS/TRAYS/PACK) ×2 IMPLANT
TRAY FOLEY W/METER SILVER 16FR (SET/KITS/TRAYS/PACK) IMPLANT
TROCAR ADV FIXATION 12X100MM (TROCAR) ×2 IMPLANT
TROCAR ADV FIXATION 5X100MM (TROCAR) ×2 IMPLANT
TROCAR BLADELESS 15MM (ENDOMECHANICALS) ×2 IMPLANT
TROCAR BLADELESS OPT 5 100 (ENDOMECHANICALS) ×2 IMPLANT
TUBING CONNECTING 10 (TUBING) ×4 IMPLANT
TUBING ENDO SMARTCAP PENTAX (MISCELLANEOUS) ×2 IMPLANT
TUBING INSUF HEATED (TUBING) ×2 IMPLANT

## 2016-08-17 NOTE — Interval H&P Note (Signed)
History and Physical Interval Note:  08/17/2016 7:26 AM  Shannon [name]  has presented today for surgery, with the diagnosis of MORBID OBESITY  The various methods of treatment have been discussed with the patient and family. She is here with her husband.  After consideration of risks, benefits and other options for treatment, the patient has consented to  Procedure(s): LAPAROSCOPIC GASTRIC BAND REMOVAL WITH LAPAROSCOPIC GASTRIC SLEEVE RESECTION WITH UPPER ENDO (N/A) as a surgical intervention .  The patient's history has been reviewed, patient examined, no change in status, stable for surgery.  I have reviewed the patient's chart and labs.  Questions were answered to the patient's satisfaction.     Savvas Roper H

## 2016-08-17 NOTE — Anesthesia Procedure Notes (Signed)
Procedure Name: Intubation Date/Time: 08/17/2016 7:39 AM Performed by: Maxwell Caul Pre-anesthesia Checklist: Patient identified, Emergency Drugs available, Suction available and Patient being monitored Patient Re-evaluated:Patient Re-evaluated prior to inductionOxygen Delivery Method: Circle system utilized Preoxygenation: Pre-oxygenation with 100% oxygen Intubation Type: IV induction Ventilation: Mask ventilation without difficulty Laryngoscope Size: Mac and 4 Grade View: Grade I Tube type: Oral Tube size: 7.5 mm Number of attempts: 1 Airway Equipment and Method: Stylet Placement Confirmation: ETT inserted through vocal cords under direct vision,  positive ETCO2 and breath sounds checked- equal and bilateral Secured at: 22 cm Tube secured with: Tape Dental Injury: Teeth and Oropharynx as per pre-operative assessment

## 2016-08-17 NOTE — Op Note (Signed)
PATIENT:   Shannon [name] DOB:   20-Dec-1965 MRN:   [id number]  DATE OF PROCEDURE: 08/17/2016                   FACILITY:  [facility]  OPERATIVE REPORT  PREOPERATIVE DIAGNOSIS:  Morbid obesity, failed lap band gastroplasty  POSTOPERATIVE DIAGNOSIS:  Morbid obesity (weight 309, BMI of 54.7), failed lap band gastroplasty  PROCEDURE:  Laparoscopic Sleeve gastrectomy (intraoperative upper endoscopy by Dr. Hassell Done), removal of lap band  SURGEON:  Fenton Malling. Lucia Gaskins, MD  FIRST ASSISTANT:  Rockne Coons, MD  ANESTHESIA:  General endotracheal.  Anesthesiologist: Lynda Rainwater, MD CRNA: Lollie Sails, CRNA; Maxwell Caul, CRNA  General  ESTIMATED BLOOD LOSS:  Minimal.  LOCAL ANESTHESIA:  50 cc of 1/4% Marcaine + Exparel  COMPLICATIONS:  None.  INDICATION FOR SURGERY:  Shannon [name] is a 51 y.o. AA female who sees Janith Lima, MD as her primary care doctor.  She had a lap band placed on 07/05/2010 by Dr. Keturah Barre. Kaeleb Emond.  She did well early and lost to about 210 pounds in 2013.  But since that time, she has had trouble feeling resistance and has steadily gained weight. She has completed our preoperative bariatric program and now comes for a laparoscopic sleeve gastrectomy.  The indications, potential complications of surgery were explained to the patient.  Potential complications of the surgery include, but are not limited to, bleeding, infection, DVT, open surgery, and long-term nutritional consequences.  OPERATIVE NOTE:  The patient taken to room #1 at [facility] where Ms. Mearl Latin underwent a general endotracheal anesthetic, supervised by Anesthesiologist: Lynda Rainwater, MD CRNA: Lollie Sails, CRNA; Maxwell Caul, CRNA.  The patient was given 2 g of cefotetan at the beginning of the procedure.  A time-out was held and surgical checklist run.  I accessed her abdominal cavity through the left upper quadrant with a 5 mm Optiview. I did an abdominal exploration.   Her omentum and bowel were  unremarkable. The right and left lobes of the liver unremarkable. Gallbladder was normal. I could see the lap banding going to the proximal stomach.  I placed a total of 6 trocars. I placed a 5 mm left lateral trocar, a 5 mm left paramedian trocar (for the scope), a 15 mm right paramedian torcar (for lap band extraction and stomach extraction), a 5 mm right subcostal trocar, and 5 mm subxiphoid trocar for the liver retractor.  I did an abdominal wall block using quarter percent Marcaine mixed with Exparel placing 20 cc along each side of the abdomen along the anterior axillary line.  I began the dissection underneath the left lobe of the liver at the upper portion of the stomach. The lap band was identified and sharply dissected from the liver and stomach. There were moderate adhesions between the liver and proximal stomach. The lap band was detached, the tubing was cut, and the lap band removed. I took down the gastric plication between the greater curvature the stomach and the proximal stomach. The 3 stay sutures of the plication were divided. This freed up the gastric cardia for the sleeve resection. There was no obvious hiatal hernia. And there was enough scar tissue the proximal stomach that I thought it unwise to dissecting out the hiatus. I did divide the scar over the stomach with a lap band had laid.  Once the lap band had been removed, I started out taking down the greater curvature attachments of the stomach.  I measured approximately 6 cm proximal from the pylorus and mobilized the greater curvature of the stomach with the Harmonic Scalpel. I took this dissection cranially around the greater curvature of her stomach to the angle of His and the left crus.   After I had mobilized the greater curvature of the stomach, I then passed the 36 French ViSiGi bougie which was used to suck the stomach up against the lesser curvature and placed into the antrum. During the staple firing,  I tried to give the  Cascade a cuff at least about 1 cm. I tried to avoid narrowing the incisura. I used a total of 5 staple firings.  From antrum to the angle of His, I used 2 green, 3 gold and 0 blue [place]on 60 mm Ethicon staplers. I did not use staple line reinforcement.   At each firing of the EndoGIA stapler, I inspected the stomach, anterior wall of the stomach, and underneath to make sure there was no compromise or impingement on to the ViSiGi bougie.   The staple line seemed linear without any corkscrewing of the stomach. Hemostasis was good. I did not use any reinforcement. She did have at least 4 areas of bleeding which I used clips to clip on the new greater curvature of the stomach.  Because I thought we had a good staple line, I then had the ViSiGi was converted to insufflate the pouch. The new stomach pouch was placed under water. There was no bubbling or leak noted.   At this point, Dr. Hassell Done broke scrub and passed an upper endoscope down through the esophagus into the stomach pouch. The stomach was tubular. There was no narrowing of the stomach pouch or angulation. We were easily able to pass the endoscope into the antrum and again put air pressure on the staple line. I irrigated the upper abdomen with saline. There was no bubbling or evidence of air leak. The mucosa looked viable. Dr. Hassell Done decompressed the stomach with the endoscopy.   I extracted the stomach remnant through the 15 mm right lateral abdominal trocar and sent this to Pathology. I then placed 10 cc of Tisseel along the new greater curvature staple line and covered the entire staple line with the Tisseel.  I aspirated out the saline that I had irrigated because I thought the staple line looked viable and complete. There was no evidence of leak. I did not leave a drain in place.   I then removed the port of the lap band by making an incision directly over the lap band port. The port was excised in its entirety. I closed the subcutaneous tissue  with a interrupted 2-0 Vicryl. I injected 10 mL of local at the port site and the largest trocar site of the abdomen.  Then, I closed the trocar sites. I placed 0 Vicryl sutures at the 15-mm port site in the right upper quadrant. The other port sites seemed smaller not requiring sutures. I closed the skin at each site with a 4-0 Monocryl, painted each wound with LiquiBand.   The patient was transported to recovery room in good condition. Sponge and needle count were correct at the end of the case.    Sleeve gastrectomy without and with Tissell.   Alphonsa Overall, MD, [facility] Surgery Pager: [phone] Office phone:  [phone]

## 2016-08-17 NOTE — Anesthesia Preprocedure Evaluation (Signed)
Anesthesia Evaluation  Patient identified by MRN, date of birth, ID band Patient awake    Reviewed: Allergy & Precautions, H&P , NPO status , Patient's Chart, lab work & pertinent test results  History of Anesthesia Complications Negative for: history of anesthetic complications  Airway Mallampati: II  TM Distance: >3 FB Neck ROM: Full    Dental  (+) Teeth Intact, Missing,    Pulmonary neg pulmonary ROS, neg sleep apnea, neg COPD, neg recent URI, Current Smoker, former smoker,    breath sounds clear to auscultation       Cardiovascular hypertension, Pt. on medications (-) angina(-) Past MI and (-) CHF negative cardio ROS  (-) dysrhythmias (-) Valvular Problems/Murmurs Rhythm:Regular     Neuro/Psych PSYCHIATRIC DISORDERS Anxiety Bipolar Disorder negative neurological ROS  negative psych ROS   GI/Hepatic negative GI ROS, Neg liver ROS, PUD, GERD  Medicated and Controlled,  Endo/Other  negative endocrine ROSneg diabetesMorbid obesity  Renal/GU negative Renal ROS  negative genitourinary   Musculoskeletal negative musculoskeletal ROS (+) Arthritis ,   Abdominal   Peds negative pediatric ROS (+)  Hematology negative hematology ROS (+)   Anesthesia Other Findings   Reproductive/Obstetrics negative OB ROS                             Anesthesia Physical  Anesthesia Plan  ASA: III  Anesthesia Plan: General   Post-op Pain Management:    Induction: Intravenous  PONV Risk Score and Plan: 4 or greater and Ondansetron, Dexamethasone, Propofol, Midazolam, Scopolamine patch - Pre-op and Treatment may vary due to age or medical condition  Airway Management Planned: Oral ETT  Additional Equipment: None  Intra-op Plan:   Post-operative Plan: Extubation in OR  Informed Consent: I have reviewed the patients History and Physical, chart, labs and discussed the procedure including the risks,  benefits and alternatives for the proposed anesthesia with the patient or authorized representative who has indicated his/her understanding and acceptance.   Dental advisory given  Plan Discussed with: CRNA and Surgeon  Anesthesia Plan Comments:         Anesthesia Quick Evaluation

## 2016-08-17 NOTE — Anesthesia Postprocedure Evaluation (Signed)
Anesthesia Post Note  Patient: Shannon [name]  Procedure(s) Performed: Procedure(s) (LRB): LAPAROSCOPIC GASTRIC BAND REMOVAL WITH LAPAROSCOPIC GASTRIC SLEEVE RESECTION WITH UPPER ENDO (N/A)     Patient location during evaluation: PACU Anesthesia Type: General Level of consciousness: awake and alert Pain management: pain level controlled Vital Signs Assessment: post-procedure vital signs reviewed and stable Respiratory status: spontaneous breathing, nonlabored ventilation and respiratory function stable Cardiovascular status: blood pressure returned to baseline and stable Postop Assessment: no signs of nausea or vomiting Anesthetic complications: no    Last Vitals:  Vitals:   08/17/16 1128 08/17/16 1130  BP: (!) 154/90 (!) 152/86  Pulse: 90 89  Resp: 20 19  Temp:      Last Pain:  Vitals:   08/17/16 1130  TempSrc:   PainSc: [place]

## 2016-08-17 NOTE — Op Note (Signed)
Shannon [name] [id number] 11/16/1965 08/17/2016  Preoperative diagnosis: weight regain with Lapband placed 2011  Postoperative diagnosis: Same   Procedure: Upper endoscopy   Surgeon: Catalina Antigua B. Hassell Done  M.D., FACS   Anesthesia: Gen.   Indications for procedure: This patient was undergoing a removal of a lapband and conversion to a sleeve gastrectomy.  Endoscopy to look for bleeding or leaks and to assess symmetry of sleeve.    Description of procedure: The endoscopy was placed in the mouth and into the oropharynx and under endoscopic vision it was advanced to the esophagogastric junction.  The pouch was insufflated and the scope passed into the antrum without difficulty.  There was no hiatal hernia noted.  Symmetry was uniform.   No bleeding or leaks were detected.  The scope was withdrawn without difficulty.     Matt B. Hassell Done, MD, FACS General, Bariatric, & Minimally Invasive Surgery [facility], [place]

## 2016-08-17 NOTE — Transfer of Care (Signed)
Immediate Anesthesia Transfer of Care Note  Patient: Shannon [name]  Procedure(s) Performed: Procedure(s): LAPAROSCOPIC GASTRIC BAND REMOVAL WITH LAPAROSCOPIC GASTRIC SLEEVE RESECTION WITH UPPER ENDO (N/A)  Patient Location: PACU  Anesthesia Type:General  Level of Consciousness:  sedated, patient cooperative and responds to stimulation  Airway & Oxygen Therapy:Patient Spontanous Breathing and Patient connected to face mask oxgen  Post-op Assessment:  Report given to PACU RN and Post -op Vital signs reviewed and stable  Post vital signs:  Reviewed and stable  Last Vitals:  Vitals:   08/17/16 0531  BP: (!) 155/83  Pulse: (!) 111  Resp: 18  Temp: 65.7 C    Complications: No apparent anesthesia complications

## 2016-08-18 LAB — CBC WITH DIFFERENTIAL/PLATELET
Basophils Absolute: 0 10*3/uL (ref 0.0–0.1)
Basophils Relative: 0 %
Eosinophils Absolute: 0 10*3/uL (ref 0.0–0.7)
Eosinophils Relative: 0 %
HCT: 39.1 % (ref 36.0–46.0)
Hemoglobin: 13.9 g/dL (ref 12.0–15.0)
Lymphocytes Relative: 11 %
Lymphs Abs: 1.4 10*3/uL (ref 0.7–4.0)
MCH: 28 pg (ref 26.0–34.0)
MCHC: 35.5 g/dL (ref 30.0–36.0)
MCV: 78.7 fL (ref 78.0–100.0)
Monocytes Absolute: 0.5 10*3/uL (ref 0.1–1.0)
Monocytes Relative: 4 %
Neutro Abs: 10.7 10*3/uL — ABNORMAL HIGH (ref 1.7–7.7)
Neutrophils Relative %: 85 %
Platelets: 258 10*3/uL (ref 150–400)
RBC: 4.97 MIL/uL (ref 3.87–5.11)
RDW: 15.7 % — ABNORMAL HIGH (ref 11.5–15.5)
WBC: 12.7 10*3/uL — ABNORMAL HIGH (ref 4.0–10.5)

## 2016-08-18 MED ORDER — ONDANSETRON 4 MG PO TBDP: 4.0000 mg | ORAL_TABLET | Freq: Three times a day (TID) | ORAL | 0 refills | Status: DC | PRN

## 2016-08-18 MED ORDER — OXYCODONE HCL 5 MG/5ML PO SOLN: 5.0000 mg | Freq: Four times a day (QID) | ORAL | 0 refills | Status: DC | PRN

## 2016-08-18 MED ORDER — LACTATED RINGERS IV SOLN
INTRAVENOUS | Status: DC
  Administered 2016-08-18: 08:00:00 via INTRAVENOUS

## 2016-08-18 MED ORDER — PANTOPRAZOLE SODIUM 40 MG PO TBEC: 40.0000 mg | DELAYED_RELEASE_TABLET | Freq: Every day | ORAL | 0 refills | Status: DC

## 2016-08-18 MED ORDER — CLONAZEPAM 0.5 MG PO TABS
0.5000 mg | ORAL_TABLET | Freq: Three times a day (TID) | ORAL | Status: DC
  Administered 2016-08-18 (×3): 0.5 mg via ORAL
  Filled 2016-08-18 (×3): qty 1

## 2016-08-18 NOTE — Progress Notes (Signed)
[facility] Office:  [phone] General Surgery Progress Note   LOS: 1 day  POD -  1 Day Post-Op  Chief Complaint: Morbid obesity  Assessment and Plan: 1.  LAPAROSCOPIC GASTRIC BAND REMOVAL WITH LAPAROSCOPIC GASTRIC SLEEVE RESECTION WITH UPPER ENDO - 08/17/2016  Tolerating liquids  Will start protein drinks - if tolerated, home later today.  Her husband is at work until 4 PM.  Discharge instructions reviewed and meds given to patient.  2.  BIPOLAR DISEASE, CHRONIC (F31.9)             Followed by [facility] mental health. Last saw Dr. Berniece Andreas on 07/07/2016. She pointed out he is Muslim. On Seroquel and Klonopin  3.  HYPERTENSION, ESSENTIAL (I10) 4. Was smoking 4 to 6 cigarettes per day She said that she stopped smoking. 5. Chronic back pain. She is on no pain meds right now. She is not actively seeing anyone for her back 6. Hypercholesterolemia 7. Partial hysterectomy in 2010 8.  DVT prophylaxis - SQ heparin 9.  Left jaw pain x 3 weeks - she did not mention this before surgery because she was afraid the surgery would have been canceled  She needs to talk to her PCP about a referral to have this evaluated.   Active Problems:   Morbid obesity ([place])  Subjective:  Doing well from po standpoint.  No vomiting. In room by herself.  Objective:   Vitals:   08/18/16 0212 08/18/16 0446  BP: (!) 161/89 (!) 156/82  Pulse: 94 98  Resp:  18  Temp:  98 F (36.7 C)     Intake/Output from previous day:  06/25 0701 - 06/26 0700 In: 2300 [P.O.:800; I.V.:1500] Out: 4332 [Urine:3150; Blood:25]  Intake/Output this shift:  No intake/output data recorded.   Physical Exam:   General: Obese AA F who is alert and oriented.    HEENT: Normal. Pupils equal. .   Lungs: Clear.  IS = 1,600 cc   Abdomen: Soft.  Rare BS   Wound: All look good.   Lab Results:    Recent Labs  08/17/16 1855 08/18/16 0525  WBC  --  12.7*  HGB 13.9  13.9  HCT 39.8 39.1  PLT  --  258    BMET  No results for input(s): NA, K, CL, CO2, GLUCOSE, BUN, CREATININE, CALCIUM in the last 72 hours.  PT/INR  No results for input(s): LABPROT, INR in the last 72 hours.  ABG  No results for input(s): PHART, HCO3 in the last 72 hours.  Invalid input(s): PCO2, PO2   Studies/Results:  No results found.   Anti-infectives:   Anti-infectives    Start     Dose/Rate Route Frequency Ordered Stop   08/17/16 0653  cefoTEtan in Dextrose 5% (CEFOTAN) 2-2.08 GM-% IVPB    Comments:  [place]   : cabinet override      08/17/16 0653 08/17/16 0745   08/17/16 0530  cefoTEtan in Dextrose 5% (CEFOTAN) IVPB 2 g     2 g Intravenous On call to O.R. 08/17/16 0530 08/17/16 0745      Alphonsa Overall, MD, FACS Pager: [facility] Office: [phone] 08/18/2016

## 2016-08-18 NOTE — Progress Notes (Signed)
Dr. Hassell Done notified of findings.   08/18/16 0212  Neurological  Neuro (WDL) X (Patient c/o left facial pain involving her cheek and head.)  Level of Consciousness Alert  Orientation Level Oriented X4  Cognition Appropriate at baseline  Speech Clear  R Pupil Size (mm) 2  R Pupil Shape Round  R Pupil Reaction Brisk  L Pupil Size (mm) 2  L Pupil Shape Round  L Pupil Reaction Brisk  Motor Function/Sensation Assessment Head  Facial Symmetry Symmetrical  Facial Droop (none notice)  Neuro Symptoms None  Delirium Risk Factor Assessment  Delirium Risk Factor Assessment Surgery this admission  Confusion Assessment Method (CAM)  SECTION I: Acute onset of mental status change No  SECTION I: Fluctuating course of behavior No  SECTION II: Inattention/difficulty focusing No  SECTION III: Disorganized thinking No  SECTION IV: Altered level of consciousness No  CAM positive? No  Delirium Prevention:  Universal Requirements (Complete for all patients with a delirium risk factor)  Universal Precautions Initiated *See Row Information* Yes  HEENT  HEENT (WDL) WDL  Respiratory  Respiratory (WDL) WDL  Respiratory Pattern Regular;Unlabored  Chest Assessment Chest expansion symmetrical

## 2016-08-18 NOTE — Care Management CC44 (Signed)
Condition Code 44 Documentation Completed  Patient Details  Name: SHERITHA LOUIS MRN: [id number] Date of Birth: 1965-08-08   Condition Code 44 given:  Yes Patient signature on Condition Code 44 notice:  Yes Documentation of 2 MD's agreement:  Yes Code 44 added to claim:  Yes    Jacqulene Huntley, RN 08/18/2016, 10:32 AM

## 2016-08-18 NOTE — Discharge Summary (Signed)
Physician Discharge Summary  Patient ID:  Shannon [name]  MRN: [id number]  DOB/AGE: November 26, 1965 51 y.o.  Admit date: 08/17/2016 Discharge date: 08/18/2016  Discharge Diagnoses:  1. Morbid obesity - failed lap band  Weight - 309, BMI - 54.7  2.  BIPOLAR DISEASE, CHRONIC (F31.9)             Followed by [facility] mental health. Last saw Dr. Berniece Andreas on 07/07/2016. She pointed out he is Muslim. On Seroquel and Klonopin  3.  HYPERTENSION, ESSENTIAL (I10) 4. Was smoking 4 to 6 cigarettes per day She said that she stopped smoking. 5. Chronic back pain. She is on no pain meds right now. She is not actively seeing anyone for her back 6. Hypercholesterolemia 7. Partial hysterectomy in 2010 8.  Left jaw pain x 3 weeks    Active Problems:   Morbid obesity ([place])  Operation: Procedure(s):   LAPAROSCOPIC GASTRIC BAND REMOVAL WITH LAPAROSCOPIC GASTRIC SLEEVE RESECTION WITH UPPER ENDO on 08/17/2016 - [facility]  Discharged Condition: good  Hospital Course: Shannon [name] is an 51 y.o. female whose primary care physician is Janith Lima, MD and who was admitted 08/17/2016 with a chief complaint of morbid obesity and failed lap band.   She was brought to the operating room on 08/17/2016 and underwent LAPAROSCOPIC GASTRIC BAND REMOVAL WITH LAPAROSCOPIC GASTRIC SLEEVE RESECTION WITH UPPER ENDO.   She is now one day post op and doing well with her diet. Her labs look good. Her main complaint is left jaw pain, which she said that she has had for 3 weeks.  She did not mention it to anyone, because she was afraid that her surgery would be cancel. She thinks it may have something to do with her teeth.  I encouraged her to talk to her PCP or dentist about an evaluation. She's going to take protein drinks and if this does well - she'll go home this afternoon.  The discharge instructions were reviewed with the patient.  Consults: None  Significant Diagnostic  Studies: Results for orders placed or performed during the hospital encounter of 08/17/16  Pregnancy, urine  Result Value Ref Range   Preg Test, Ur NEGATIVE NEGATIVE  Hemoglobin and hematocrit, blood  Result Value Ref Range   Hemoglobin 13.9 12.0 - 15.0 g/dL   HCT 39.8 36.0 - 46.0 %  CBC WITH DIFFERENTIAL  Result Value Ref Range   WBC 12.7 (H) 4.0 - 10.5 K/uL   RBC 4.97 3.87 - 5.11 MIL/uL   Hemoglobin 13.9 12.0 - 15.0 g/dL   HCT 39.1 36.0 - 46.0 %   MCV 78.7 78.0 - 100.0 fL   MCH 28.0 26.0 - 34.0 pg   MCHC 35.5 30.0 - 36.0 g/dL   RDW 15.7 (H) 11.5 - 15.5 %   Platelets 258 150 - 400 K/uL   Neutrophils Relative % 85 %   Neutro Abs 10.7 (H) 1.7 - 7.7 K/uL   Lymphocytes Relative 11 %   Lymphs Abs 1.4 0.7 - 4.0 K/uL   Monocytes Relative 4 %   Monocytes Absolute 0.5 0.1 - 1.0 K/uL   Eosinophils Relative 0 %   Eosinophils Absolute 0.0 0.0 - 0.7 K/uL   Basophils Relative 0 %   Basophils Absolute 0.0 0.0 - 0.1 K/uL    No results found.  Discharge Exam:  Vitals:   08/18/16 0212 08/18/16 0446  BP: (!) 161/89 (!) 156/82  Pulse: 94 98  Resp:  18  Temp:  98  F (36.7 C)    General: Obese AA F who is alert and generally healthy appearing.          I do not see any facial swelling. Lungs: Clear to auscultation and symmetric breath sounds. Heart:  RRR. No murmur or rub. Abdomen: Soft. No mass.  No hernia. Few bowel sounds.     Her incisions look good.  Discharge Medications:   Allergies as of 08/18/2016      Reactions   Lamictal [lamotrigine] Rash      Medication List    TAKE these medications   aspirin EC 81 MG tablet Take 81 mg by mouth daily.   BYSTOLIC 10 MG tablet Generic drug:  nebivolol TAKE 1 TABLET (10 MG TOTAL) BY MOUTH DAILY.   clonazePAM 0.5 MG tablet Commonly known as:  KLONOPIN Take 1 tablet (0.5 mg total) by mouth 3 (three) times daily as needed for anxiety. What changed:  how much to take  when to take this   [employer] B-1 100 MG tablet Generic  drug:  thiamine TAKE 1 TABLET EVERY DAY   ezetimibe-simvastatin 10-40 MG tablet Commonly known as:  VYTORIN TAKE 1 TABLET BY MOUTH DAILY   FERIVA 21/7 75-1 MG Tabs Take 1 tablet by mouth daily.   gabapentin 600 MG tablet Commonly known as:  NEURONTIN TAKE 1 TABLET BY MOUTH 3 TIMES A DAY What changed:  See the new instructions.   multivitamin tablet Take 1 tablet by mouth daily. Reported on 09/09/2015   NEXIUM 40 MG capsule Generic drug:  esomeprazole Take 1 capsule (40 mg total) by mouth 2 (two) times daily before a meal. Brand name only What changed:  when to take this  reasons to take this  additional instructions   ondansetron 4 MG disintegrating tablet Commonly known as:  ZOFRAN ODT Take 1 tablet (4 mg total) by mouth every 8 (eight) hours as needed for nausea or vomiting.   oxyCODONE 5 MG/5ML solution Commonly known as:  ROXICODONE Take 5-10 mLs (5-10 mg total) by mouth every 6 (six) hours as needed for moderate pain or severe pain.   pantoprazole 40 MG tablet Commonly known as:  PROTONIX Take 1 tablet (40 mg total) by mouth daily.   QUEtiapine 300 MG 24 hr tablet Commonly known as:  SEROQUEL XR Take 1 tablet (300 mg total) by mouth daily.   traZODone 150 MG tablet Commonly known as:  DESYREL Take 1 tablet (150 mg total) by mouth at bedtime.   Vitamin D (Ergocalciferol) 50000 units Caps capsule Commonly known as:  DRISDOL TAKE ONE CAPSULE BY MOUTH EVERY 7 DAYS       Disposition: 01-Home or Self Care  Discharge Instructions    Ambulate hourly while awake    Complete by:  As directed    Call MD for:  difficulty breathing, headache or visual disturbances    Complete by:  As directed    Call MD for:  persistant dizziness or light-headedness    Complete by:  As directed    Call MD for:  persistant nausea and vomiting    Complete by:  As directed    Call MD for:  redness, tenderness, or signs of infection (pain, swelling, redness, odor or green/yellow  discharge around incision site)    Complete by:  As directed    Call MD for:  severe uncontrolled pain    Complete by:  As directed    Call MD for:  temperature >101 F    Complete  by:  As directed    Diet bariatric full liquid    Complete by:  As directed    Incentive spirometry    Complete by:  As directed    Perform hourly while awake      Activity:  Driving - Do not drive while on pain meds.  May drive when doing well.   Lifting - No lifting more that 15 pounds for 10 days, then no limit  Wound Care:   May shower starting tomorrow  Diet:  Post op sleeve diet  Follow up appointment:  Call Dr. Pollie Friar office [facility]) at [phone] for an appointment in 2 weeks.  Medications and dosages:  Resume your home medications.  You have a prescription for:  Oxycodone, protonix, zofran   Signed: Alphonsa Overall, M.D., [facility] Office:  [phone]  08/18/2016, 8:03 AM

## 2016-08-18 NOTE — Progress Notes (Signed)
Assessment unchanged. Pt and husband verbalized understanding of dc instructions through teach back including follow up care and when to call the doctor. No scripts at dc. Neil Crouch, RN completed bariatric teaching this am. Discharged via wc to front entrance accompanied by NT and husband.

## 2016-08-18 NOTE — Care Management Obs Status (Signed)
[place] NOTIFICATION   Patient Details  Name: Shannon [name] MRN: [id number] Date of Birth: 1965-10-05   Medicare Observation Status Notification Given:  Yes    Purcell Mouton, RN 08/18/2016, 10:32 AM

## 2016-08-18 NOTE — Progress Notes (Signed)
Patient alert and oriented, pain is controlled. Patient is tolerating fluids, advanced to protein shake today, patient is tolerating well.  Reviewed Gastric sleeve discharge instructions with patient and patient is able to articulate understanding.  Provided information on BELT program, Support Group and [facility] outpatient pharmacy.  Reviewed post op diet and vitamin intake with patient.  Also reviewed vitamin list and pricing from outpatient pharmacy.   All questions answered, will continue to monitor.  Neil Crouch RN

## 2016-08-18 NOTE — Discharge Instructions (Signed)
[facility] - DISCHARGE INSTRUCTIONS TO PATIENT  Activity:  Driving - Do not drive while on pain meds.  May drive when doing well.   Lifting - No lifting more that 15 pounds for 10 days, then no limit  Wound Care:   May shower starting tomorrow  Diet:  Post op sleeve diet  Follow up appointment:  Call Dr. Pollie Friar office [facility]) at [phone] for an appointment in 2 weeks.  Medications and dosages:  Resume your home medications.  You have a prescription for:  Oxycodone, protonix, zofran  Call Dr. Lucia Gaskins or his office  [phone]) if you have:  Temperature greater than 100.4,  Persistent nausea and vomiting,  Severe uncontrolled pain,  Redness, tenderness, or signs of infection (pain, swelling, redness, odor or green/yellow discharge around the site),  Difficulty breathing, headache or visual disturbances,  Any other questions or concerns you may have after discharge.  In an emergency, call 911 or go to an Emergency Department at a nearby hospital.

## 2016-08-18 NOTE — Plan of Care (Signed)
Problem: Food- and Nutrition-Related Knowledge Deficit (NB-1.1) Goal: Nutrition education Formal process to instruct or train a patient/client in a skill or to impart knowledge to help patients/clients voluntarily manage or modify food choices and eating behavior to maintain or improve health. Outcome: Completed/Met Date Met: 08/18/16 Nutrition Education Note  Received consult for diet education per DROP protocol. Spoke with RN who states patient will need to purchase vitamins and minerals from [facility] outpatient pharmacy at discharge. Pt with no other questions for RD at this time.  Discussed 2 week post op diet with pt. Emphasized that liquids must be non carbonated, non caffeinated, and sugar free. Fluid goals discussed. Pt to follow up with outpatient bariatric RD for further diet progression after 2 weeks. Multivitamins and minerals also reviewed. Teach back method used, pt expressed understanding, expect good compliance.   Diet: First 2 Weeks  You will see the nutritionist about two (2) weeks after your surgery. The nutritionist will increase the types of foods you can eat if you are handling liquids well:  If you have severe vomiting or nausea and cannot handle clear liquids lasting longer than 1 day, call your surgeon  Protein Shake  Drink at least 2 ounces of shake 5-6 times per day  Each serving of protein shakes (usually 8 - 12 ounces) should have a minimum of:  15 grams of protein  And no more than 5 grams of carbohydrate  Goal for protein each day:  Men = 80 grams per day  Women = 60 grams per day  Protein powder may be added to fluids such as non-fat milk or Lactaid milk or Soy milk (limit to 35 grams added protein powder per serving)   Hydration  Slowly increase the amount of water and other clear liquids as tolerated (See Acceptable Fluids)  Slowly increase the amount of protein shake as tolerated  Sip fluids slowly and throughout the day  May use sugar substitutes in small  amounts (no more than 6 - 8 packets per day; i.e. Splenda)   Fluid Goal  The first goal is to drink at least 8 ounces of protein shake/drink per day (or as directed by the nutritionist); some examples of protein shakes are Premier Protein, [employer], [employer], EAS Edge HP, and Unjury. See handout from pre-op Bariatric Education Class:  Slowly increase the amount of protein shake you drink as tolerated  You may find it easier to slowly sip shakes throughout the day  It is important to get your proteins in first  Your fluid goal is to drink 64 - 100 ounces of fluid daily  It may take a few weeks to build up to this  32 oz (or more) should be clear liquids  And  32 oz (or more) should be full liquids (see below for examples)  Liquids should not contain sugar, caffeine, or carbonation   Clear Liquids:  Water or Sugar-free flavored water (i.e. Fruit H2O, Propel)  Decaffeinated coffee or tea (sugar-free)  Crystal Lite, Wyler's Lite, Minute Maid Lite  Sugar-free Jell-O  Bouillon or broth  Sugar-free Popsicle: *Less than 20 calories each; Limit 1 per day   Full Liquids:  Protein Shakes/Drinks + 2 choices per day of other full liquids  Full liquids must be:  No More Than 12 grams of Carbs per serving  No More Than 3 grams of Fat per serving  Strained low-fat cream soup  Non-Fat milk  Fat-free Lactaid Milk  Sugar-free yogurt ([place] Lite & Fit, [place]  yogurt, Oikos Zero)   Clayton Bibles, MS, RD, LDN Pager: [phone] After Hours Pager: [phone]

## 2016-08-27 ENCOUNTER — Telehealth (HOSPITAL_COMMUNITY): Payer: Self-pay

## 2016-08-27 NOTE — Telephone Encounter (Addendum)
Attempted to contact patient for surgery follow up.  No answer at number provided.  Chart flagged as do not leave voice message on answering machine.     Patient returned call at [place] discharge phone call to patient  Asking the following questions.    1. Do you have someone to care for you now that you are home?  indeipendent 2. Are you having pain now that is not relieved by your pain medication? no  3. Are you able to drink the recommended daily amount of fluids (48 ounces minimum/day) and protein (60-80 grams/day) as prescribed by the dietitian or nutritional counselor?  32-40 ounces of fluid, only drinking protein water at this time 4. Are you taking the vitamins and minerals as prescribed?  yes 5. Do you have the "on call" number to contact your surgeon if you have a problem or question?  yes 6. Are your incisions free of redness, swelling or drainage? (If steri strips, address that these can fall off, shower as tolerated) yes 7. Have your bowels moved since your surgery?  If not, are you passing gas?  No bm from patient she has tried over the counter laxatives (miralax) and suppositories.  Conitinues to be constipated, nauseated, and bloated.  Enough that it has effected her fluid intake.  Instructed her to call Dr Lucia Gaskins office to day to seek assistance with constipation.  Patient repeats instruction and is calling now. 8. Are you up and walking 3-4 times per day?  yes 9. Were you provided your discharge medications before your surgery or before you were discharged from the hospital and are you taking them without problem?  yes

## 2016-09-01 ENCOUNTER — Encounter: Payer: Medicare Other | Attending: Surgery | Admitting: Registered"

## 2016-09-01 DIAGNOSIS — Z713 Dietary counseling and surveillance: Secondary | ICD-10-CM | POA: Diagnosis present

## 2016-09-01 DIAGNOSIS — Z01818 Encounter for other preprocedural examination: Secondary | ICD-10-CM | POA: Diagnosis not present

## 2016-09-01 DIAGNOSIS — E669 Obesity, unspecified: Secondary | ICD-10-CM

## 2016-09-02 NOTE — Progress Notes (Signed)
Bariatric Class:  Appt start time: 1530 end time:  1630.  2 Week Post-Operative Nutrition Class  Patient was seen on 09/01/2016 for Post-Operative Nutrition education at [facility].   Surgery date: 08/17/2016 Surgery type: LAGB removal to sleeve gastrectomy Start weight at [facility]: 301.5 lbs Weight today: 295.6 lbs Weight change: 5.9 lbs loss  TANITA  BODY COMP RESULTS  09/01/2016   BMI (kg/m^2) N/A   Fat Mass (lbs)    Fat Free Mass (lbs)    Total Body Water (lbs)     Pt reports having slight changes to vision, experiencing some dizziness/lightheadedness, and some N/V/D/C/G.  Rd educated pt on behaviors to improve symptoms she is currently experiencing.    The following the learning objectives were met by the patient during this course:  Identifies Phase 3A (Soft, High Proteins) Dietary Goals and will begin from 2 weeks post-operatively to 2 months post-operatively  Identifies appropriate sources of fluids and proteins   States protein recommendations and appropriate sources post-operatively  Identifies the need for appropriate texture modifications, mastication, and bite sizes when consuming solids  Identifies appropriate multivitamin and calcium sources post-operatively  Describes the need for physical activity post-operatively and will follow MD recommendations  States when to call healthcare provider regarding medication questions or post-operative complications  Handouts given during class include:  Phase 3A: Soft, High Protein Diet Handout  Follow-Up Plan: Patient will follow-up at [facility] in 6 weeks for 2 month post-op nutrition visit for diet advancement per MD.

## 2016-09-04 ENCOUNTER — Other Ambulatory Visit: Payer: Self-pay | Admitting: Internal Medicine

## 2016-10-06 ENCOUNTER — Other Ambulatory Visit: Payer: Self-pay | Admitting: Internal Medicine

## 2016-10-07 ENCOUNTER — Ambulatory Visit (HOSPITAL_COMMUNITY): Payer: Self-pay | Admitting: Psychiatry

## 2016-10-08 ENCOUNTER — Other Ambulatory Visit (HOSPITAL_COMMUNITY): Payer: Self-pay | Admitting: Psychiatry

## 2016-10-08 DIAGNOSIS — F319 Bipolar disorder, unspecified: Secondary | ICD-10-CM

## 2016-10-12 ENCOUNTER — Other Ambulatory Visit (HOSPITAL_COMMUNITY): Payer: Self-pay

## 2016-10-12 DIAGNOSIS — F319 Bipolar disorder, unspecified: Secondary | ICD-10-CM

## 2016-10-12 MED ORDER — CLONAZEPAM 0.5 MG PO TABS: 0.5000 mg | ORAL_TABLET | Freq: Three times a day (TID) | ORAL | 0 refills | Status: DC | PRN

## 2016-10-12 MED ORDER — TRAZODONE HCL 150 MG PO TABS: 150.0000 mg | ORAL_TABLET | Freq: Every day | ORAL | 0 refills | Status: DC

## 2016-10-12 MED ORDER — QUETIAPINE FUMARATE ER 300 MG PO TB24: 300.0000 mg | ORAL_TABLET | Freq: Every day | ORAL | 0 refills | Status: DC

## 2016-10-13 ENCOUNTER — Ambulatory Visit: Payer: Self-pay | Admitting: Skilled Nursing Facility1

## 2016-10-22 ENCOUNTER — Ambulatory Visit (HOSPITAL_COMMUNITY): Payer: Medicare Other | Admitting: Psychiatry

## 2016-11-04 ENCOUNTER — Other Ambulatory Visit (HOSPITAL_COMMUNITY): Payer: Self-pay | Admitting: Psychiatry

## 2016-11-04 DIAGNOSIS — F319 Bipolar disorder, unspecified: Secondary | ICD-10-CM

## 2016-11-10 ENCOUNTER — Ambulatory Visit (INDEPENDENT_AMBULATORY_CARE_PROVIDER_SITE_OTHER): Payer: Medicare Other | Admitting: Psychiatry

## 2016-11-10 ENCOUNTER — Encounter (HOSPITAL_COMMUNITY): Payer: Self-pay | Admitting: Psychiatry

## 2016-11-10 DIAGNOSIS — Z560 Unemployment, unspecified: Secondary | ICD-10-CM

## 2016-11-10 DIAGNOSIS — Z87891 Personal history of nicotine dependence: Secondary | ICD-10-CM

## 2016-11-10 DIAGNOSIS — Z818 Family history of other mental and behavioral disorders: Secondary | ICD-10-CM

## 2016-11-10 DIAGNOSIS — F419 Anxiety disorder, unspecified: Secondary | ICD-10-CM

## 2016-11-10 DIAGNOSIS — F319 Bipolar disorder, unspecified: Secondary | ICD-10-CM | POA: Diagnosis not present

## 2016-11-10 DIAGNOSIS — Z811 Family history of alcohol abuse and dependence: Secondary | ICD-10-CM | POA: Diagnosis not present

## 2016-11-10 MED ORDER — TRAZODONE HCL 150 MG PO TABS: 150.0000 mg | ORAL_TABLET | Freq: Every day | ORAL | 0 refills | Status: DC

## 2016-11-10 MED ORDER — QUETIAPINE FUMARATE ER 300 MG PO TB24: 300.0000 mg | ORAL_TABLET | Freq: Every day | ORAL | 0 refills | Status: DC

## 2016-11-10 MED ORDER — CLONAZEPAM 0.5 MG PO TABS: 0.5000 mg | ORAL_TABLET | Freq: Three times a day (TID) | ORAL | 2 refills | Status: DC | PRN

## 2016-11-10 NOTE — Progress Notes (Signed)
BH MD/PA/NP OP Progress Note  11/10/2016 2:27 PM Shannon [name]  MRN:  [id number]  Chief Complaint:  I went in [place] to attend baby shower for my nephew.  But that event that into tragedy and she lost the baby.   HPI: Patient came for her follow-up appointment.  She missed last appointment because she was in [place].  She went to attend her nephew's daughter baby shower but that he was born premature and did not survive and died.  Patient told the whole family was very upset and since she returned to [place] she is has been sad and depressed.  She did not go outside until recently she started to leave the house.  Her husband did not go to [place].  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  She recently had sleeve surgery for her abdominal and she is recovering very well from her.  She denies any crying spells or any irritability but she feels sad and dysphoric.  She does not want to change her medication.  She is taking Klonopin which is helping her anxiety.  She denies any paranoia, hallucination or any suicidal thoughts.  Her energy level is fair.  She had lost weight after the surgery .  She is not interested in counseling.  She denies any mania or any psychosis.  She wants to continue her current psychiatric medication.  Visit Diagnosis:    ICD-10-CM   1. Bipolar I disorder (HCC) F31.9 traZODone (DESYREL) 150 MG tablet    QUEtiapine (SEROQUEL XR) 300 MG 24 hr tablet    clonazePAM (KLONOPIN) 0.5 MG tablet    Past Psychiatric History: Reviewed. Patient has history of multiple psychiatric hospitalization due to suicidal thoughts, paranoia and hallucination. She has one suicidal attempt when she cut her wrist. She endorse mania, impulsivity, anger issues and severe depression. Her last psychiatric hospitalization was 2014 at [facility]. In the past she had tried Zoloft, Risperdal,  Zyprexa, lithium and Depakote. She remember EPS with Risperdal. She also tried Lamictal but developed a rash and Wellbutrin cause twitching.     Past Medical History:  Past Medical History:  Diagnosis Date  . Allergic rhinitis   . ANEMIA-IRON DEFICIENCY 01/25/2009  . Anxiety   . Bipolar 1 disorder ([place])   . Chronic pain syndrome   . Complete tear of right rotator cuff 07/21/2013  . Contact lens/glasses fitting   . [place] DISEASE, LUMBAR 01/25/2009  . GERD (gastroesophageal reflux disease)   . HYPERLIPIDEMIA 01/25/2009  . HYPERTENSION 01/25/2009  . MANIC DEPRESSIVE ILLNESS 01/25/2009   pt is unsue if this is her specifc dx  . Morbid obesity ([place]) 01/25/2009  . Night sweats   . PEPTIC ULCER DISEASE, HELICOBACTER PYLORI POSITIVE 10/03/2009    Past Surgical History:  Procedure Laterality Date  . ABDOMINAL HYSTERECTOMY    . BLADDER SURGERY     s/p with ?diverticulitis  . HAND TENDON SURGERY  1991   s/p-Right-index and middle  . LAPAROSCOPIC GASTRIC BAND REMOVAL WITH LAPAROSCOPIC GASTRIC SLEEVE RESECTION N/A 08/17/2016   Procedure: LAPAROSCOPIC GASTRIC BAND REMOVAL WITH LAPAROSCOPIC GASTRIC SLEEVE RESECTION WITH UPPER ENDO;  Surgeon: Alphonsa Overall, MD;  Location: [facility];  Service: General;  Laterality: N/A;  . LAPAROSCOPIC GASTRIC BANDING  07/14/10  . SHOULDER ARTHROSCOPY Right 07/21/2013   Procedure: RIGHT ARTHROSCOPY SHOULDER DEBRIDMENT EXTENTSIVE,ARTHROSCOPIC REMOVE LOOSE FOREIGN BODY, BICEPS TENOLYSIS ;  Surgeon:  Johnny Bridge, MD;  Location: [facility];  Service: Orthopedics;  Laterality: Right;  . TUBAL LIGATION      Family Psychiatric History: Reviewed.  Family History:  Family History  Problem Relation Age of Onset  . Hypertension Mother   . Asthma Mother   . Stroke Father   . Cirrhosis Father        ETOH  . Hypertension Father   . Diabetes Father   . Alcohol abuse Father   . Hypertension Sister   . Asthma Sister   . Hypertension Brother   . ADD / ADHD  Son   . Hypertension Paternal Aunt   . Diabetes Maternal Grandmother   . ADD / ADHD Other        3 nephews, also emotional issues  . Diabetes Other        Uncle  . Diabetes Other        Aunt  . Suicidality Neg Hx     Social History:  Social History   Social History  . Marital status: Married    Spouse name: N/A  . Number of children: 2  . Years of education: N/A   Occupational History  . STUDENT Unemployed   Social History Main Topics  . Smoking status: Former Smoker    Packs/day: 0.25    Years: 20.00    Types: Cigarettes  . Smokeless tobacco: Never Used     Comment: States has cut back to 5-7 a day and working on this.   . Alcohol use No  . Drug use: No  . Sexual activity: Not Currently   Other Topics Concern  . Not on file   Social History Narrative   Moved from [place], then [place] to [place] 2009   2 children-1 boy, 1 girl   Disabled-bipolar   Daily Caffeine Use-1 cup/day    Allergies:  Allergies  Allergen Reactions  . Lamictal [Lamotrigine] Rash    Metabolic Disorder Labs: Lab Results  Component Value Date   HGBA1C 5.6 06/11/2014   No results found for: PROLACTIN Lab Results  Component Value Date   CHOL 191 06/12/2015   TRIG 117.0 06/12/2015   HDL 48.10 06/12/2015   CHOLHDL 4 06/12/2015   VLDL 23.4 06/12/2015   LDLCALC 119 (H) 06/12/2015   LDLCALC 124 (H) 09/26/2014   Lab Results  Component Value Date   TSH 2.76 06/12/2015   TSH 2.20 06/11/2014    Therapeutic Level Labs: No results found for: LITHIUM No results found for: VALPROATE No components found for:  CBMZ  Current Medications: Current Outpatient Prescriptions  Medication Sig Dispense Refill  . aspirin EC 81 MG tablet Take 81 mg by mouth daily.    Marland Kitchen BYSTOLIC 10 MG tablet TAKE 1 TABLET (10 MG TOTAL) BY MOUTH DAILY. 90 tablet 1  . clonazePAM (KLONOPIN) 0.5 MG tablet Take 1 tablet (0.5 mg total) by mouth 3 (three) times daily as needed for anxiety. 90 tablet 0  . [facility] B-1  100 MG tablet TAKE 1 TABLET EVERY DAY 100 tablet 1  . ezetimibe-simvastatin (VYTORIN) 10-40 MG tablet TAKE 1 TABLET BY MOUTH DAILY 90 tablet 3  . FeAsp-B12-FA-C-DSS-SuccAc-Zn (FERIVA 21/7) 75-1 MG TABS Take 1 tablet by mouth daily. (Patient not taking: Reported on 08/12/2016) 28 tablet 11  . gabapentin (NEURONTIN) 600 MG tablet TAKE 1 TABLET BY MOUTH 3 TIMES A DAY (Patient taking differently: TAKE 1 TABLET BY MOUTH AT BEDTIME) 90 tablet 2  . Multiple Vitamin (MULTIVITAMIN) tablet  Take 1 tablet by mouth daily. Reported on 09/09/2015    . NEXIUM 40 MG capsule Take 1 capsule (40 mg total) by mouth 2 (two) times daily before a meal. Brand name only (Patient taking differently: Take 40 mg by mouth 2 (two) times daily as needed (heartburn). Brand name only) 180 capsule 3  . ondansetron (ZOFRAN ODT) 4 MG disintegrating tablet Take 1 tablet (4 mg total) by mouth every 8 (eight) hours as needed for nausea or vomiting. 12 tablet 0  . oxyCODONE (ROXICODONE) 5 MG/5ML solution Take 5-10 mLs (5-10 mg total) by mouth every 6 (six) hours as needed for moderate pain or severe pain. 240 mL 0  . pantoprazole (PROTONIX) 40 MG tablet Take 1 tablet (40 mg total) by mouth daily. 30 tablet 0  . QUEtiapine (SEROQUEL XR) 300 MG 24 hr tablet Take 1 tablet (300 mg total) by mouth daily. 30 tablet 0  . traZODone (DESYREL) 150 MG tablet Take 1 tablet (150 mg total) by mouth at bedtime. 30 tablet 0  . Vitamin D, Ergocalciferol, (DRISDOL) 50000 units CAPS capsule TAKE ONE CAPSULE BY MOUTH EVERY 7 DAYS 12 capsule 3   No current facility-administered medications for this visit.      Musculoskeletal: Strength & Muscle Tone: within normal limits Gait & Station: normal Patient leans: N/A  Psychiatric Specialty Exam: ROS  Blood pressure 128/78, pulse (!) 114, height 5' 3.5" (1.613 m), weight 270 lb 12.8 oz (122.8 kg).There is no height or weight on file to calculate BMI.  General Appearance: Casual  Eye Contact:  Fair  Speech:   Slow  Volume:  Decreased  Mood:  Dysphoric  Affect:  Constricted  Thought Process:  Goal Directed  Orientation:  Full (Time, Place, and Person)  Thought Content: Rumination   Suicidal Thoughts:  No  Homicidal Thoughts:  No  Memory:  Immediate;   Good Recent;   Good Remote;   Good  Judgement:  Good  Insight:  Good  Psychomotor Activity:  Normal  Concentration:  Concentration: Fair and Attention Span: Good  Recall:  Good  Fund of Knowledge: Good  Language: Good  Akathisia:  No  Handed:  Right  AIMS (if indicated): not done  Assets:  Communication Skills Desire for Improvement Housing Resilience Social Support  ADL's:  Intact  Cognition: WNL  Sleep:  Fair   Screenings: AUDIT     Admission (Discharged) from 07/12/2012 in [facility]  Alcohol Use Disorder Identification Test Final Score (AUDIT)  0    PHQ2-9     Nutrition [facility] Nutrition from 02/27/2016 in [facility] Nutrition from 01/27/2016 in [facility] Nutrition from 12/25/2015 in [facility] Nutrition from 11/26/2015 in [facility]  PHQ-2 Total Score  0  0  0  0  0       Assessment and Plan: Bipolar disorder type I.  Anxiety disorder NOS.  Reassurance given.  One more time recommended to see a therapist but patient refused.  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  I will continue trazodone 50 mg at bedtime, Seroquel 200 mg at bedtime and Klonopin 0.5 mg 3 times a day.  Patient is going back again in November to visit family in [place].  She is looking forward to that trip.  Discussed  medication side effects and benefits.  Recommended to call us back if she has any question, concern or if she feels worsening of the symptom.  Follow-up in 3  months.   Mansel Strother T., MD 11/10/2016, 2:27 PM

## 2016-11-19 DIAGNOSIS — I10 Essential (primary) hypertension: Secondary | ICD-10-CM | POA: Diagnosis not present

## 2016-11-26 ENCOUNTER — Other Ambulatory Visit: Payer: Self-pay | Admitting: Internal Medicine

## 2016-12-21 ENCOUNTER — Telehealth: Payer: Self-pay

## 2016-12-21 ENCOUNTER — Other Ambulatory Visit (INDEPENDENT_AMBULATORY_CARE_PROVIDER_SITE_OTHER): Payer: Medicare Other

## 2016-12-21 ENCOUNTER — Ambulatory Visit (INDEPENDENT_AMBULATORY_CARE_PROVIDER_SITE_OTHER): Payer: Medicare Other | Admitting: Internal Medicine

## 2016-12-21 ENCOUNTER — Encounter: Payer: Self-pay | Admitting: Internal Medicine

## 2016-12-21 VITALS — BP 124/74 | HR 90 | Temp 98.8°F | Resp 16 | Ht 63.5 in | Wt 261.5 lb

## 2016-12-21 DIAGNOSIS — Z9884 Bariatric surgery status: Secondary | ICD-10-CM

## 2016-12-21 DIAGNOSIS — E559 Vitamin D deficiency, unspecified: Secondary | ICD-10-CM

## 2016-12-21 DIAGNOSIS — E876 Hypokalemia: Secondary | ICD-10-CM | POA: Diagnosis not present

## 2016-12-21 DIAGNOSIS — Z0000 Encounter for general adult medical examination without abnormal findings: Secondary | ICD-10-CM | POA: Diagnosis not present

## 2016-12-21 DIAGNOSIS — Z1231 Encounter for screening mammogram for malignant neoplasm of breast: Secondary | ICD-10-CM

## 2016-12-21 DIAGNOSIS — E785 Hyperlipidemia, unspecified: Secondary | ICD-10-CM

## 2016-12-21 DIAGNOSIS — Z23 Encounter for immunization: Secondary | ICD-10-CM

## 2016-12-21 DIAGNOSIS — R87629 Unspecified abnormal cytological findings in specimens from vagina: Secondary | ICD-10-CM

## 2016-12-21 DIAGNOSIS — I10 Essential (primary) hypertension: Secondary | ICD-10-CM

## 2016-12-21 DIAGNOSIS — E519 Thiamine deficiency, unspecified: Secondary | ICD-10-CM

## 2016-12-21 DIAGNOSIS — D508 Other iron deficiency anemias: Secondary | ICD-10-CM | POA: Diagnosis not present

## 2016-12-21 LAB — LIPID PANEL
Cholesterol: 164 mg/dL (ref 0–200)
HDL: 45 mg/dL (ref >39.00)
LDL Cholesterol: 104 mg/dL — ABNORMAL HIGH (ref 0–99)
NonHDL: 118.97
Total CHOL/HDL Ratio: 4
Triglycerides: 75 mg/dL (ref 0.0–149.0)
VLDL: 15 mg/dL (ref 0.0–40.0)

## 2016-12-21 LAB — CBC WITH DIFFERENTIAL/PLATELET
Basophils Absolute: 0.1 10*3/uL (ref 0.0–0.1)
Basophils Relative: 1.3 % (ref 0.0–3.0)
Eosinophils Absolute: 0.1 10*3/uL (ref 0.0–0.7)
Eosinophils Relative: 2.2 % (ref 0.0–5.0)
HCT: 41.6 % (ref 36.0–46.0)
Hemoglobin: 14.1 g/dL (ref 12.0–15.0)
Lymphocytes Relative: 34.3 % (ref 12.0–46.0)
Lymphs Abs: 2 10*3/uL (ref 0.7–4.0)
MCHC: 33.9 g/dL (ref 30.0–36.0)
MCV: 84 fl (ref 78.0–100.0)
Monocytes Absolute: 0.4 10*3/uL (ref 0.1–1.0)
Monocytes Relative: 6.4 % (ref 3.0–12.0)
Neutro Abs: 3.3 10*3/uL (ref 1.4–7.7)
Neutrophils Relative %: 55.8 % (ref 43.0–77.0)
Platelets: 256 10*3/uL (ref 150.0–400.0)
RBC: 4.95 Mil/uL (ref 3.87–5.11)
RDW: 16 % — ABNORMAL HIGH (ref 11.5–15.5)
WBC: 5.9 10*3/uL (ref 4.0–10.5)

## 2016-12-21 LAB — FERRITIN: Ferritin: 65.5 ng/mL (ref 10.0–291.0)

## 2016-12-21 LAB — VITAMIN D 25 HYDROXY (VIT D DEFICIENCY, FRACTURES): VITD: 50.51 ng/mL (ref 30.00–100.00)

## 2016-12-21 LAB — FOLATE: Folate: 13.9 ng/mL (ref >5.9)

## 2016-12-21 LAB — TSH: TSH: 2.09 u[IU]/mL (ref 0.35–4.50)

## 2016-12-21 LAB — COMPREHENSIVE METABOLIC PANEL
ALT: 15 U/L (ref 0–35)
AST: 15 U/L (ref 0–37)
Albumin: 3.8 g/dL (ref 3.5–5.2)
Alkaline Phosphatase: 90 U/L (ref 39–117)
BUN: 6 mg/dL (ref 6–23)
CO2: 28 mEq/L (ref 19–32)
Calcium: 9.1 mg/dL (ref 8.4–10.5)
Chloride: 105 mEq/L (ref 96–112)
Creatinine, Ser: 0.55 mg/dL (ref 0.40–1.20)
GFR: 148.98 mL/min (ref >60.00)
Glucose, Bld: 97 mg/dL (ref 70–99)
Potassium: 3 mEq/L — ABNORMAL LOW (ref 3.5–5.1)
Sodium: 141 mEq/L (ref 135–145)
Total Bilirubin: 0.5 mg/dL (ref 0.2–1.2)
Total Protein: 7.7 g/dL (ref 6.0–8.3)

## 2016-12-21 LAB — IBC PANEL
Iron: 43 ug/dL (ref 42–145)
Saturation Ratios: 13.9 % — ABNORMAL LOW (ref 20.0–50.0)
Transferrin: 221 mg/dL (ref 212.0–360.0)

## 2016-12-21 LAB — VITAMIN B12: Vitamin B-12: 569 pg/mL (ref 211–911)

## 2016-12-21 NOTE — Patient Instructions (Signed)

## 2016-12-21 NOTE — Progress Notes (Signed)
Subjective:  Patient ID: Shannon [name], female    DOB: Nov 25, 1965  Age: 51 y.o. MRN: [id number]  CC: Annual Exam   HPI Shannon [name] presents for a CPX. She underwent gastric sleeve about 4 months ago, she has lost about 50 pounds and feels well.  Past Medical History:  Diagnosis Date  . Allergic rhinitis   . ANEMIA-IRON DEFICIENCY 01/25/2009  . Anxiety   . Bipolar 1 disorder ([place])   . Chronic pain syndrome   . Complete tear of right rotator cuff 07/21/2013  . Contact lens/glasses fitting   . [place] DISEASE, LUMBAR 01/25/2009  . GERD (gastroesophageal reflux disease)   . HYPERLIPIDEMIA 01/25/2009  . HYPERTENSION 01/25/2009  . MANIC DEPRESSIVE ILLNESS 01/25/2009   pt is unsue if this is her specifc dx  . Morbid obesity ([place]) 01/25/2009  . Night sweats   . PEPTIC ULCER DISEASE, HELICOBACTER PYLORI POSITIVE 10/03/2009   Past Surgical History:  Procedure Laterality Date  . ABDOMINAL HYSTERECTOMY    . BLADDER SURGERY     s/p with ?diverticulitis  . HAND TENDON SURGERY  1991   s/p-Right-index and middle  . LAPAROSCOPIC GASTRIC BAND REMOVAL WITH LAPAROSCOPIC GASTRIC SLEEVE RESECTION N/A 08/17/2016   Procedure: LAPAROSCOPIC GASTRIC BAND REMOVAL WITH LAPAROSCOPIC GASTRIC SLEEVE RESECTION WITH UPPER ENDO;  Surgeon: Alphonsa Overall, MD;  Location: [facility];  Service: General;  Laterality: N/A;  . LAPAROSCOPIC GASTRIC BANDING  07/14/10  . SHOULDER ARTHROSCOPY Right 07/21/2013   Procedure: RIGHT ARTHROSCOPY SHOULDER DEBRIDMENT EXTENTSIVE,ARTHROSCOPIC REMOVE LOOSE FOREIGN BODY, BICEPS TENOLYSIS ;  Surgeon: Johnny Bridge, MD;  Location: [facility];  Service: Orthopedics;  Laterality: Right;  . TUBAL LIGATION      reports that she has quit smoking. Her smoking use included Cigarettes. She has a 5.00 pack-year smoking history. She has never used smokeless tobacco. She reports that she does not drink alcohol or use drugs. family history includes ADD / ADHD in her other and son;  Alcohol abuse in her father; Asthma in her mother and sister; Cirrhosis in her father; Diabetes in her father, maternal grandmother, other, and other; Hypertension in her brother, father, mother, paternal aunt, and sister; Stroke in her father. Allergies  Allergen Reactions  . Lamictal [Lamotrigine] Rash    Outpatient Medications Prior to Visit  Medication Sig Dispense Refill  . aspirin EC 81 MG tablet Take 81 mg by mouth daily.    Marland Kitchen BYSTOLIC 10 MG tablet TAKE 1 TABLET (10 MG TOTAL) BY MOUTH DAILY. 90 tablet 1  . clonazePAM (KLONOPIN) 0.5 MG tablet Take 1 tablet (0.5 mg total) by mouth 3 (three) times daily as needed for anxiety. 90 tablet 2  . CVS B-1 100 MG tablet TAKE 1 TABLET EVERY DAY 100 tablet 1  . ezetimibe-simvastatin (VYTORIN) 10-40 MG tablet TAKE 1 TABLET BY MOUTH DAILY 90 tablet 3  . Multiple Vitamin (MULTIVITAMIN) tablet Take 1 tablet by mouth daily. Reported on 09/09/2015    . NEXIUM 40 MG capsule Take 1 capsule (40 mg total) by mouth 2 (two) times daily before a meal. Brand name only (Patient taking differently: Take 40 mg by mouth 2 (two) times daily as needed (heartburn). Brand name only) 180 capsule 3  . QUEtiapine (SEROQUEL XR) 300 MG 24 hr tablet Take 1 tablet (300 mg total) by mouth daily. 90 tablet 0  . Vitamin D, Ergocalciferol, (DRISDOL) 50000 units CAPS capsule TAKE ONE CAPSULE BY MOUTH EVERY 7 DAYS 12 capsule 3  . FeAsp-B12-FA-C-DSS-SuccAc-Zn [facility]  21/7) 75-1 MG TABS Take 1 tablet by mouth daily. (Patient not taking: Reported on 08/12/2016) 28 tablet 11  . gabapentin (NEURONTIN) 600 MG tablet TAKE 1 TABLET BY MOUTH 3 TIMES A DAY (Patient taking differently: TAKE 1 TABLET BY MOUTH AT BEDTIME) 90 tablet 2  . ondansetron (ZOFRAN ODT) 4 MG disintegrating tablet Take 1 tablet (4 mg total) by mouth every 8 (eight) hours as needed for nausea or vomiting. 12 tablet 0  . oxyCODONE (ROXICODONE) 5 MG/5ML solution Take 5-10 mLs (5-10 mg total) by mouth every 6 (six) hours as needed  for moderate pain or severe pain. 240 mL 0  . pantoprazole (PROTONIX) 40 MG tablet Take 1 tablet (40 mg total) by mouth daily. 30 tablet 0  . traZODone (DESYREL) 150 MG tablet Take 1 tablet (150 mg total) by mouth at bedtime. 90 tablet 0   No facility-administered medications prior to visit.     ROS Review of Systems  Constitutional: Negative.  Negative for chills, fatigue and fever.  HENT: Negative.  Negative for sinus pressure and trouble swallowing.   Eyes: Negative.  Negative for visual disturbance.  Respiratory: Negative for cough, chest tightness, shortness of breath and wheezing.   Cardiovascular: Negative for chest pain, palpitations and leg swelling.  Gastrointestinal: Negative for abdominal pain, constipation, diarrhea, nausea and vomiting.  Endocrine: Negative.   Genitourinary: Negative.  Negative for decreased urine volume, difficulty urinating, hematuria, urgency, vaginal bleeding and vaginal discharge.  Musculoskeletal: Negative.  Negative for back pain and neck pain.  Skin: Negative.   Allergic/Immunologic: Negative.   Neurological: Negative.  Negative for dizziness, weakness and headaches.  Hematological: Negative for adenopathy. Does not bruise/bleed easily.  Psychiatric/Behavioral: Negative.  Negative for decreased concentration, dysphoric mood and suicidal ideas. The patient is not nervous/anxious.     Objective:  BP 124/74 (BP Location: Left Arm, Patient Position: Sitting, Cuff Size: Large)   Pulse 90   Temp 98.8 F (37.1 C) (Oral)   Resp 16   Ht 5' 3.5" (1.613 m)   Wt 261 lb 8 oz (118.6 kg)   SpO2 95%   BMI 45.60 kg/m   BP Readings from Last 3 Encounters:  12/21/16 124/74  08/18/16 (!) 157/98  08/12/16 (!) 151/65    Wt Readings from Last 3 Encounters:  12/21/16 261 lb 8 oz (118.6 kg)  09/02/16 295 lb 9.6 oz (134.1 kg)  08/17/16 (!) 311 lb 3.2 oz (141.2 kg)    Physical Exam  Constitutional: She is oriented to person, place, and time. No distress.    HENT:  Mouth/Throat: Oropharynx is clear and moist. No oropharyngeal exudate.  Eyes: Conjunctivae are normal. Right eye exhibits no discharge. Left eye exhibits no discharge. No scleral icterus.  Neck: Normal range of motion. Neck supple. No JVD present. No thyromegaly present.  Cardiovascular: Normal rate, regular rhythm and intact distal pulses.  Exam reveals no gallop and no friction rub.   No murmur heard. Pulmonary/Chest: Effort normal and breath sounds normal. No respiratory distress. She has no wheezes. She has no rales. She exhibits no tenderness.  Abdominal: Soft. Bowel sounds are normal. She exhibits no distension and no mass. There is no tenderness. There is no rebound and no guarding.  Genitourinary:  Genitourinary Comments: Breast, GU, rectal exams were deferred at her request. She is muslim and not willing to undress for a female physician.  Musculoskeletal: Normal range of motion. She exhibits no edema, tenderness or deformity.  Lymphadenopathy:    She has no cervical  adenopathy.  Neurological: She is alert and oriented to person, place, and time.  Skin: Skin is warm and dry. No rash noted. She is not diaphoretic. No erythema. No pallor.  Psychiatric: She has a normal mood and affect. Her behavior is normal. Judgment and thought content normal.  Vitals reviewed.   Lab Results  Component Value Date   WBC 5.9 12/21/2016   HGB 14.1 12/21/2016   HCT 41.6 12/21/2016   PLT 256.0 12/21/2016   GLUCOSE 97 12/21/2016   CHOL 164 12/21/2016   TRIG 75.0 12/21/2016   HDL 45.00 12/21/2016   LDLDIRECT 195.6 10/26/2012   LDLCALC 104 (H) 12/21/2016   ALT 15 12/21/2016   AST 15 12/21/2016   NA 141 12/21/2016   K 3.0 (L) 12/21/2016   CL 105 12/21/2016   CREATININE 0.55 12/21/2016   BUN 6 12/21/2016   CO2 28 12/21/2016   TSH 2.09 12/21/2016   HGBA1C 5.6 06/11/2014    No results found.  Assessment & Plan:   Shannon [name] was seen today for annual exam.  Diagnoses and all orders  for this visit:  Hyperlipidemia with target LDL less than 130- statin tx is not indiacted -     Lipid panel; Future -     TSH; Future  Essential hypertension- her BP is well controlled -     Comprehensive metabolic panel; Future  Other iron deficiency anemia- H/H are normal, will cont iron replacement therapy -     IBC panel; Future -     CBC with Differential/Platelet; Future -     Ferritin; Future  Morbid obesity ([place])- improvement noted, she was praised for her success  Thiamin deficiency- will monitor her B1 level -     Vitamin B1; Future  Vitamin D deficiency- will cont Vit D replacement tx -     VITAMIN D 25 Hydroxy (Vit-D Deficiency, Fractures); Future  Hx of laparoscopic gastric banding -     Folate; Future -     Vitamin B1; Future -     Vitamin B12; Future -     VITAMIN D 25 Hydroxy (Vit-D Deficiency, Fractures); Future  Need for influenza vaccination -     Flu Vaccine QUAD 36+ mos IM  Visit for screening mammogram -     MM DIGITAL SCREENING BILATERAL; Future  Abnormal vaginal Pap smear -     Ambulatory referral to Gynecology  Hypokalemia, inadequate intake- this is related to po intake, will start K+ replacement tx   I have discontinued Shannon [name]'s FERIVA 21/7, gabapentin, oxyCODONE, ondansetron, pantoprazole, and traZODone. I am also having her start on potassium chloride SA. Additionally, I am having her maintain her multivitamin, NEXIUM, Vitamin D (Ergocalciferol), ezetimibe-simvastatin, aspirin EC, BYSTOLIC, CVS B-1, QUEtiapine, and clonazePAM.  Meds ordered this encounter  Medications  . potassium chloride SA (KLOR-CON M20) 20 MEQ tablet    Sig: Take 1 tablet (20 mEq total) by mouth 2 (two) times daily.    Dispense:  180 tablet    Refill:  1   See AVS for instructions about healthy living and anticipatory guidance.  Follow-up: Return in about 6 months (around 06/21/2017).  Scarlette Calico, MD

## 2016-12-21 NOTE — Telephone Encounter (Signed)
Order [id number]

## 2016-12-22 ENCOUNTER — Encounter: Payer: Self-pay | Admitting: Internal Medicine

## 2016-12-22 DIAGNOSIS — Z0000 Encounter for general adult medical examination without abnormal findings: Secondary | ICD-10-CM | POA: Insufficient documentation

## 2016-12-22 MED ORDER — POTASSIUM CHLORIDE CRYS ER 20 MEQ PO TBCR: 20.0000 meq | EXTENDED_RELEASE_TABLET | Freq: Two times a day (BID) | ORAL | 1 refills | Status: DC

## 2016-12-22 NOTE — Assessment & Plan Note (Addendum)
Cologuard ordered to screen for colon cancer  The written screening recommendations is given to patient and attached in the patent instructions or AVS.   The patient is here for annual Medicare wellness examination and management of other chronic and acute problems.   The risk factors are reflected in the social history.  The roster of all physicians providing medical care to patient - is listed in the Snapshot section of the chart.  Activities of daily living:  The patient is 100% inedpendent in all ADLs: dressing, toileting, feeding as well as independent mobility  Home safety : The patient has smoke detectors in the home. They wear seatbelts.No firearms at home ( firearms are present in the home, kept in a safe fashion). There is no violence in the home.   There is no risks for hepatitis, STDs or HIV. There is no   history of blood transfusion. They have no travel history to infectious disease endemic areas of the world.  The patient has (has not) seen their dentist in the last six month. They have (not) seen their eye doctor in the last year. They deny (admit to) any hearing difficulty and have not had audiologic testing in the last year.  They do not  have excessive sun exposure. Discussed the need for sun protection: hats, long sleeves and use of sunscreen if there is significant sun exposure.   Diet: the importance of a healthy diet is discussed. They do have a healthy (unhealthy-high fat/fast food) diet.  The patient has a regular exercise program.  The benefits of regular aerobic exercise were discussed.  Depression screen: there are no signs or vegative symptoms of depression- irritability, change in appetite, anhedonia, sadness/tearfullness.  Cognitive assessment: the patient manages all their financial and personal affairs and is actively engaged. They could relate day,date,year and events; recalled 3/3 objects at 3 minutes; performed clock-face test normally.  The following  portions of the patient's history were reviewed and updated as appropriate: allergies, current medications, past family history, past medical history,  past surgical history, past social history  and problem list.  Vision, hearing, body mass index were assessed and reviewed.   During the course of the visit the patient was educated and counseled about appropriate screening and preventive services including : fall prevention , diabetes screening, nutrition counseling, colorectal cancer screening, and recommended immunizations.

## 2016-12-27 ENCOUNTER — Other Ambulatory Visit: Payer: Self-pay | Admitting: Internal Medicine

## 2016-12-27 ENCOUNTER — Encounter: Payer: Self-pay | Admitting: Internal Medicine

## 2016-12-27 DIAGNOSIS — E519 Thiamine deficiency, unspecified: Secondary | ICD-10-CM

## 2016-12-27 LAB — VITAMIN B1: Vitamin B1 (Thiamine): <6 nmol/L — ABNORMAL LOW (ref 8–30)

## 2016-12-27 MED ORDER — THIAMINE HCL 100 MG PO TABS: 100.0000 mg | ORAL_TABLET | Freq: Every day | ORAL | 3 refills | Status: DC

## 2017-01-07 DIAGNOSIS — Z1212 Encounter for screening for malignant neoplasm of rectum: Secondary | ICD-10-CM | POA: Diagnosis not present

## 2017-01-07 DIAGNOSIS — Z1211 Encounter for screening for malignant neoplasm of colon: Secondary | ICD-10-CM | POA: Diagnosis not present

## 2017-01-09 LAB — COLOGUARD: Cologuard: NEGATIVE

## 2017-01-28 ENCOUNTER — Encounter: Payer: Self-pay | Admitting: Internal Medicine

## 2017-02-05 NOTE — Telephone Encounter (Signed)
Results were negative. This has been abstracted and HM updated.  

## 2017-02-08 ENCOUNTER — Other Ambulatory Visit (HOSPITAL_COMMUNITY): Payer: Self-pay | Admitting: Psychiatry

## 2017-02-08 DIAGNOSIS — F319 Bipolar disorder, unspecified: Secondary | ICD-10-CM

## 2017-02-09 ENCOUNTER — Ambulatory Visit (HOSPITAL_COMMUNITY): Payer: Medicare Other | Admitting: Psychiatry

## 2017-02-09 ENCOUNTER — Other Ambulatory Visit (HOSPITAL_COMMUNITY): Payer: Self-pay

## 2017-02-09 DIAGNOSIS — F319 Bipolar disorder, unspecified: Secondary | ICD-10-CM

## 2017-02-09 MED ORDER — CLONAZEPAM 0.5 MG PO TABS: 0.5000 mg | ORAL_TABLET | Freq: Three times a day (TID) | ORAL | 0 refills | Status: DC | PRN

## 2017-03-01 ENCOUNTER — Encounter (HOSPITAL_COMMUNITY): Payer: Self-pay | Admitting: Psychiatry

## 2017-03-01 ENCOUNTER — Ambulatory Visit (INDEPENDENT_AMBULATORY_CARE_PROVIDER_SITE_OTHER): Payer: Medicare Other | Admitting: Psychiatry

## 2017-03-01 DIAGNOSIS — Z560 Unemployment, unspecified: Secondary | ICD-10-CM | POA: Diagnosis not present

## 2017-03-01 DIAGNOSIS — F319 Bipolar disorder, unspecified: Secondary | ICD-10-CM | POA: Diagnosis not present

## 2017-03-01 DIAGNOSIS — Z818 Family history of other mental and behavioral disorders: Secondary | ICD-10-CM | POA: Diagnosis not present

## 2017-03-01 DIAGNOSIS — F419 Anxiety disorder, unspecified: Secondary | ICD-10-CM

## 2017-03-01 DIAGNOSIS — Z811 Family history of alcohol abuse and dependence: Secondary | ICD-10-CM | POA: Diagnosis not present

## 2017-03-01 MED ORDER — TRAZODONE HCL 150 MG PO TABS: 150.0000 mg | ORAL_TABLET | Freq: Every day | ORAL | 0 refills | Status: DC

## 2017-03-01 MED ORDER — QUETIAPINE FUMARATE ER 400 MG PO TB24: 400.0000 mg | ORAL_TABLET | Freq: Every day | ORAL | 0 refills | Status: DC

## 2017-03-01 MED ORDER — CLONAZEPAM 0.5 MG PO TABS: 0.5000 mg | ORAL_TABLET | Freq: Three times a day (TID) | ORAL | 2 refills | Status: DC | PRN

## 2017-03-01 NOTE — Progress Notes (Signed)
[facility] MD/PA/NP OP Progress Note  03/01/2017 10:30 AM Shannon [name]  MRN:  [id number]  Chief Complaint: I am sad and sometimes get irritable.  I am not sleeping as good.  HPI: Patient came for her follow-up appointment.  She is experiencing increased irritability and frustration.  She went to [place] to visit her daughter but her daughter moved out and she has no news where she is living.  She stayed with her sister and came back.  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  Her biggest concern is that she has no longer prescription coverage and she is not sure how she is going out for her medication.  She is on disability and now she need a forms to be completed so she can discharge student loan due to her disability.  Patient denies any paranoia, hallucination but sometimes gets frustrated and irritable.  Some days she is so sad that she does not get out of the bed.  She is very disappointed from her daughter's behavior.  Patient like to get counseling but she cannot afford at this time.  Patient denies drinking alcohol or using any illegal substances.  She denies any crying spells but admitted sometimes poor sleep and racing thoughts.  She does not want to try a different medication but wondering if dose can further increase to help her sleep and racing thoughts.  Patient denies any suicidal thoughts or homicidal thoughts.  She lives with her husband who is supportive.    Visit Diagnosis:    ICD-10-CM   1. Bipolar I disorder (HCC) F31.9 traZODone (DESYREL) 150 MG tablet    clonazePAM (KLONOPIN) 0.5 MG tablet    QUEtiapine (SEROQUEL XR) 400 MG 24 hr tablet    Past Psychiatric History: Reviewed. Patient has history of multiple psychiatric hospitalization due to suicidal thoughts, paranoia and hallucination. She has one suicidal attempt when she cut her wrist. She endorse mania, impulsivity, anger issues  and severe depression. Her last psychiatric hospitalization was 2014 at [facility]. In the past she had tried Zoloft, Risperdal, Zyprexa, lithium and Depakote. She remember EPS with Risperdal. She also tried Lamictal but developed a rash and Wellbutrin cause twitching.   Past Medical History:  Past Medical History:  Diagnosis Date  . Allergic rhinitis   . ANEMIA-IRON DEFICIENCY 01/25/2009  . Anxiety   . Bipolar 1 disorder ([place])   . Chronic pain syndrome   . Complete tear of right rotator cuff 07/21/2013  . Contact lens/glasses fitting   . [place] DISEASE, LUMBAR 01/25/2009  . GERD (gastroesophageal reflux disease)   . HYPERLIPIDEMIA 01/25/2009  . HYPERTENSION 01/25/2009  . MANIC DEPRESSIVE ILLNESS 01/25/2009   pt is unsue if this is her specifc dx  . Morbid obesity ([place]) 01/25/2009  . Night sweats   . PEPTIC ULCER DISEASE, HELICOBACTER PYLORI POSITIVE 10/03/2009    Past Surgical History:  Procedure Laterality Date  . ABDOMINAL HYSTERECTOMY    . BLADDER SURGERY     s/p with ?diverticulitis  . HAND TENDON SURGERY  1991   s/p-Right-index and middle  . LAPAROSCOPIC GASTRIC BAND REMOVAL WITH LAPAROSCOPIC GASTRIC SLEEVE RESECTION N/A 08/17/2016   Procedure: LAPAROSCOPIC GASTRIC BAND REMOVAL WITH LAPAROSCOPIC GASTRIC SLEEVE RESECTION WITH UPPER ENDO;  Surgeon: Alphonsa Overall, MD;  Location: [facility];  Service: General;  Laterality: N/A;  . LAPAROSCOPIC GASTRIC BANDING  07/14/10  . SHOULDER ARTHROSCOPY Right 07/21/2013  Procedure: RIGHT ARTHROSCOPY SHOULDER DEBRIDMENT EXTENTSIVE,ARTHROSCOPIC REMOVE LOOSE FOREIGN BODY, BICEPS TENOLYSIS ;  Surgeon: Johnny Bridge, MD;  Location: [facility];  Service: Orthopedics;  Laterality: Right;  . TUBAL LIGATION      Family Psychiatric History: Reviewed.  Family History:  Family History  Problem Relation Age of Onset  . Hypertension Mother   . Asthma Mother   . Stroke Father   . Cirrhosis Father        ETOH  .  Hypertension Father   . Diabetes Father   . Alcohol abuse Father   . Hypertension Sister   . Asthma Sister   . Hypertension Brother   . ADD / ADHD Son   . Hypertension Paternal Aunt   . Diabetes Maternal Grandmother   . ADD / ADHD Other        3 nephews, also emotional issues  . Diabetes Other        Uncle  . Diabetes Other        Aunt  . Suicidality Neg Hx     Social History:  Social History   Socioeconomic History  . Marital status: Married    Spouse name: None  . Number of children: 2  . Years of education: None  . Highest education level: None  Social Needs  . Financial resource strain: None  . Food insecurity - worry: None  . Food insecurity - inability: None  . Transportation needs - medical: None  . Transportation needs - non-medical: None  Occupational History  . Occupation: Lexicographer: UNEMPLOYED  Tobacco Use  . Smoking status: Former Smoker    Packs/day: 0.25    Years: 20.00    Pack years: 5.00    Types: Cigarettes  . Smokeless tobacco: Never Used  . Tobacco comment: States has cut back to 5-7 a day and working on this.   Substance and Sexual Activity  . Alcohol use: No    Alcohol/week: 0.0 oz  . Drug use: No  . Sexual activity: Not Currently  Other Topics Concern  . None  Social History Narrative   Moved from [place], then [place] to [place] 2009   2 children-1 boy, 1 girl   Disabled-bipolar   Daily Caffeine Use-1 cup/day    Allergies:  Allergies  Allergen Reactions  . Lamictal [Lamotrigine] Rash    Metabolic Disorder Labs: Lab Results  Component Value Date   HGBA1C 5.6 06/11/2014   No results found for: PROLACTIN Lab Results  Component Value Date   CHOL 164 12/21/2016   TRIG 75.0 12/21/2016   HDL 45.00 12/21/2016   CHOLHDL 4 12/21/2016   VLDL 15.0 12/21/2016   LDLCALC 104 (H) 12/21/2016   LDLCALC 119 (H) 06/12/2015   Lab Results  Component Value Date   TSH 2.09 12/21/2016   TSH 2.76 06/12/2015    Therapeutic  Level Labs: No results found for: LITHIUM No results found for: VALPROATE No components found for:  CBMZ  Current Medications: Current Outpatient Medications  Medication Sig Dispense Refill  . clonazePAM (KLONOPIN) 0.5 MG tablet Take 1 tablet (0.5 mg total) by mouth 3 (three) times daily as needed for anxiety. 90 tablet 0  . ezetimibe-simvastatin (VYTORIN) 10-40 MG tablet TAKE 1 TABLET BY MOUTH DAILY 90 tablet 3  . Multiple Vitamin (MULTIVITAMIN) tablet Take 1 tablet by mouth daily. Reported on 09/09/2015    . NEXIUM 40 MG capsule Take 1 capsule (40 mg total) by mouth 2 (two) times daily  before a meal. Brand name only (Patient taking differently: Take 40 mg by mouth 2 (two) times daily as needed (heartburn). Brand name only) 180 capsule 3  . QUEtiapine (SEROQUEL XR) 300 MG 24 hr tablet TAKE 1 TABLET BY MOUTH EVERY DAY 30 tablet 0  . thiamine ([facility]S B-1) 100 MG tablet Take 1 tablet (100 mg total) daily by mouth. 100 tablet 3  . traZODone (DESYREL) 150 MG tablet TAKE 1 TABLET (150 MG TOTAL) BY MOUTH AT BEDTIME. 30 tablet 0  . Vitamin D, Ergocalciferol, (DRISDOL) 50000 units CAPS capsule TAKE ONE CAPSULE BY MOUTH EVERY 7 DAYS 12 capsule 3  . BYSTOLIC 10 MG tablet TAKE 1 TABLET (10 MG TOTAL) BY MOUTH DAILY. (Patient not taking: Reported on 03/01/2017) 90 tablet 1  . potassium chloride SA (KLOR-CON M20) 20 MEQ tablet Take 1 tablet (20 mEq total) by mouth 2 (two) times daily. (Patient not taking: Reported on 03/01/2017) 180 tablet 1   No current facility-administered medications for this visit.      Musculoskeletal: Strength & Muscle Tone: within normal limits Gait & Station: normal Patient leans: N/A  Psychiatric Specialty Exam: ROS  Blood pressure 132/78, pulse (!) 120, height 5\' 3"  (1.6 m), weight 253 lb (114.8 kg).Body mass index is 44.82 kg/m.  General Appearance: Casual and wearing hijab  Eye Contact:  Good  Speech:  Clear and Coherent  Volume:  Normal  Mood:  Anxious  Affect:   Appropriate  Thought Process:  Goal Directed  Orientation:  Full (Time, Place, and Person)  Thought Content: Rumination   Suicidal Thoughts:  No  Homicidal Thoughts:  No  Memory:  Immediate;   Good Recent;   Good Remote;   Good  Judgement:  Good  Insight:  Good  Psychomotor Activity:  Normal  Concentration:  Concentration: Fair and Attention Span: Fair  Recall:  Good  Fund of Knowledge: Good  Language: Good  Akathisia:  No  Handed:  Right  AIMS (if indicated): not done  Assets:  Communication Skills Desire for Improvement Housing Resilience  ADL's:  Intact  Cognition: WNL  Sleep:  Fair   Screenings: AUDIT     Admission (Discharged) from 07/12/2012 in [facility]  Alcohol Use Disorder Identification Test Final Score (AUDIT)  0    PHQ2-9     Nutrition [facility]2016 [facility] from 02/27/2016 [facility] Nutrition from 01/27/2016 [facility] Nutrition from 12/25/2015 [facility] from 11/26/2015 [facility]  PHQ-2 Total Score  0  0  0  0  0       Assessment and Plan: Bipolar disorder type I.  Anxiety disorder NOS.  Reassurance given.  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  Discussed medication side effects and benefits.  I will try Seroquel XR 400 mg at bedtime.  She used to take this dose but gradually cut it down when she was feeling good.  Continue trazodone 150 mg at bedtime and Klonopin 0.5 mg 3 times a day.  We will complete discharge federal on papers, patient is already on disability.  Discussed medication side effects and benefits.  One more time encouraged to see a therapist for counseling but patient at this time cannot afford therapy.  I encourage she should call Medicare for part D prescription  coverage.  Patient  promised that she will look into that.  I recommended to call us back if she has any question, concern or if she feels worsening of the symptoms.  Discussed safety concerns at any time having active suicidal thoughts or homicidal thought and she need to call 911 or go to local emergency room.  Follow-up in 3 months.   Kathlee Nations, MD 03/01/2017, 10:30 AM

## 2017-04-04 ENCOUNTER — Other Ambulatory Visit (HOSPITAL_COMMUNITY): Payer: Self-pay | Admitting: Psychiatry

## 2017-04-04 DIAGNOSIS — F319 Bipolar disorder, unspecified: Secondary | ICD-10-CM

## 2017-04-12 ENCOUNTER — Other Ambulatory Visit (HOSPITAL_COMMUNITY): Payer: Self-pay | Admitting: Psychiatry

## 2017-04-12 DIAGNOSIS — F319 Bipolar disorder, unspecified: Secondary | ICD-10-CM

## 2017-04-12 MED ORDER — TRAZODONE HCL 150 MG PO TABS: 150.0000 mg | ORAL_TABLET | Freq: Every day | ORAL | 1 refills | Status: DC

## 2017-04-28 ENCOUNTER — Other Ambulatory Visit: Payer: Self-pay | Admitting: Internal Medicine

## 2017-06-01 ENCOUNTER — Ambulatory Visit (HOSPITAL_COMMUNITY): Payer: Medicare Other | Admitting: Psychiatry

## 2017-06-02 ENCOUNTER — Ambulatory Visit (INDEPENDENT_AMBULATORY_CARE_PROVIDER_SITE_OTHER): Payer: Medicare Other | Admitting: Psychiatry

## 2017-06-02 ENCOUNTER — Encounter (HOSPITAL_COMMUNITY): Payer: Self-pay | Admitting: Psychiatry

## 2017-06-02 VITALS — BP 134/84 | HR 98 | Ht 63.5 in | Wt 251.0 lb

## 2017-06-02 DIAGNOSIS — Z818 Family history of other mental and behavioral disorders: Secondary | ICD-10-CM

## 2017-06-02 DIAGNOSIS — F419 Anxiety disorder, unspecified: Secondary | ICD-10-CM

## 2017-06-02 DIAGNOSIS — F4310 Post-traumatic stress disorder, unspecified: Secondary | ICD-10-CM

## 2017-06-02 DIAGNOSIS — G4700 Insomnia, unspecified: Secondary | ICD-10-CM

## 2017-06-02 DIAGNOSIS — F319 Bipolar disorder, unspecified: Secondary | ICD-10-CM | POA: Diagnosis not present

## 2017-06-02 DIAGNOSIS — Z560 Unemployment, unspecified: Secondary | ICD-10-CM

## 2017-06-02 DIAGNOSIS — Z87891 Personal history of nicotine dependence: Secondary | ICD-10-CM | POA: Diagnosis not present

## 2017-06-02 DIAGNOSIS — Z811 Family history of alcohol abuse and dependence: Secondary | ICD-10-CM | POA: Diagnosis not present

## 2017-06-02 MED ORDER — QUETIAPINE FUMARATE ER 300 MG PO TB24: 300.0000 mg | ORAL_TABLET | Freq: Every day | ORAL | 0 refills | Status: DC

## 2017-06-02 MED ORDER — TRAZODONE HCL 150 MG PO TABS: 150.0000 mg | ORAL_TABLET | Freq: Every day | ORAL | 0 refills | Status: DC

## 2017-06-02 MED ORDER — CLONAZEPAM 0.5 MG PO TABS: 0.5000 mg | ORAL_TABLET | Freq: Three times a day (TID) | ORAL | 0 refills | Status: DC | PRN

## 2017-06-02 MED ORDER — PRAZOSIN HCL 1 MG PO CAPS
1.0000 mg | ORAL_CAPSULE | Freq: Every day | ORAL | 0 refills | Status: DC
Start: 2017-06-02 — End: 2017-07-13
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
## 2017-06-13 ENCOUNTER — Other Ambulatory Visit (HOSPITAL_COMMUNITY): Payer: Self-pay | Admitting: Psychiatry

## 2017-06-14 ENCOUNTER — Other Ambulatory Visit (HOSPITAL_COMMUNITY): Payer: Self-pay | Admitting: Psychiatry

## 2017-06-16 ENCOUNTER — Telehealth (HOSPITAL_COMMUNITY): Payer: Self-pay

## 2017-06-16 DIAGNOSIS — F319 Bipolar disorder, unspecified: Secondary | ICD-10-CM

## 2017-06-16 NOTE — Telephone Encounter (Signed)
Patient called and said she is still having problems sleeping, she is only getting 2-3 hours a night and wants to know if there is something else she can try. Please review and advise, thnak you

## 2017-06-17 ENCOUNTER — Other Ambulatory Visit (HOSPITAL_COMMUNITY): Payer: Self-pay | Admitting: Psychiatry

## 2017-06-17 MED ORDER — TRAZODONE HCL 100 MG PO TABS: 200.0000 mg | ORAL_TABLET | Freq: Every day | ORAL | 0 refills | Status: DC

## 2017-06-17 NOTE — Telephone Encounter (Signed)
I called patient and let her know I sent a new order to the pharmacy for 200 mg Trazodone

## 2017-06-17 NOTE — Telephone Encounter (Signed)
She need to try little longer on Minipress.

## 2017-06-17 NOTE — Telephone Encounter (Signed)
If she tried to be depressed 2 mg and that did not help her sleep then she can discontinued and try trazodone 200 mg at bedtime.

## 2017-06-17 NOTE — Addendum Note (Signed)
Addended by: Lethea Killings on: 06/17/2017 02:23 PM   Modules accepted: Orders

## 2017-06-17 NOTE — Telephone Encounter (Signed)
I called patient and she said that she is not willing to just stay on the medication until her appointment, she states she increased the dose to 2 mg at bedtime along with the Trazodone and she is only sleeping for 2-3 hours. Patient states she can not continue on like this and she has things she needs to take care of, but can not due to the insomnia. Please review and advise, thank you

## 2017-06-22 ENCOUNTER — Other Ambulatory Visit (HOSPITAL_COMMUNITY): Payer: Self-pay | Admitting: Psychiatry

## 2017-06-22 DIAGNOSIS — F4310 Post-traumatic stress disorder, unspecified: Secondary | ICD-10-CM

## 2017-06-24 ENCOUNTER — Other Ambulatory Visit (HOSPITAL_COMMUNITY): Payer: Self-pay | Admitting: Psychiatry

## 2017-06-24 DIAGNOSIS — F319 Bipolar disorder, unspecified: Secondary | ICD-10-CM

## 2017-07-03 ENCOUNTER — Other Ambulatory Visit (HOSPITAL_COMMUNITY): Payer: Self-pay

## 2017-07-03 DIAGNOSIS — F319 Bipolar disorder, unspecified: Secondary | ICD-10-CM

## 2017-07-03 MED ORDER — QUETIAPINE FUMARATE ER 300 MG PO TB24: 300.0000 mg | ORAL_TABLET | Freq: Every day | ORAL | 0 refills | Status: DC

## 2017-07-05 ENCOUNTER — Other Ambulatory Visit (HOSPITAL_COMMUNITY): Payer: Self-pay | Admitting: Psychiatry

## 2017-07-13 ENCOUNTER — Ambulatory Visit (INDEPENDENT_AMBULATORY_CARE_PROVIDER_SITE_OTHER): Payer: Medicare Other | Admitting: Psychiatry

## 2017-07-13 ENCOUNTER — Encounter (HOSPITAL_COMMUNITY): Payer: Self-pay | Admitting: Psychiatry

## 2017-07-13 DIAGNOSIS — Z62811 Personal history of psychological abuse in childhood: Secondary | ICD-10-CM | POA: Diagnosis not present

## 2017-07-13 DIAGNOSIS — Z818 Family history of other mental and behavioral disorders: Secondary | ICD-10-CM

## 2017-07-13 DIAGNOSIS — F4310 Post-traumatic stress disorder, unspecified: Secondary | ICD-10-CM | POA: Diagnosis not present

## 2017-07-13 DIAGNOSIS — F319 Bipolar disorder, unspecified: Secondary | ICD-10-CM | POA: Diagnosis not present

## 2017-07-13 DIAGNOSIS — F515 Nightmare disorder: Secondary | ICD-10-CM | POA: Diagnosis not present

## 2017-07-13 DIAGNOSIS — Z811 Family history of alcohol abuse and dependence: Secondary | ICD-10-CM | POA: Diagnosis not present

## 2017-07-13 DIAGNOSIS — Z62810 Personal history of physical and sexual abuse in childhood: Secondary | ICD-10-CM | POA: Diagnosis not present

## 2017-07-13 DIAGNOSIS — Z560 Unemployment, unspecified: Secondary | ICD-10-CM

## 2017-07-13 DIAGNOSIS — F419 Anxiety disorder, unspecified: Secondary | ICD-10-CM

## 2017-07-13 MED ORDER — CLONAZEPAM 0.5 MG PO TABS: 0.5000 mg | ORAL_TABLET | Freq: Three times a day (TID) | ORAL | 1 refills | Status: DC | PRN

## 2017-07-13 MED ORDER — ZOLPIDEM TARTRATE 10 MG PO TABS: 10.0000 mg | ORAL_TABLET | Freq: Every day | ORAL | 1 refills | Status: DC

## 2017-07-13 MED ORDER — TRAZODONE HCL 100 MG PO TABS: 200.0000 mg | ORAL_TABLET | Freq: Every day | ORAL | 1 refills | Status: DC

## 2017-07-13 MED ORDER — QUETIAPINE FUMARATE ER 300 MG PO TB24: 300.0000 mg | ORAL_TABLET | Freq: Every day | ORAL | 1 refills | Status: DC
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
## 2017-07-20 ENCOUNTER — Other Ambulatory Visit (HOSPITAL_COMMUNITY): Payer: Self-pay | Admitting: Psychiatry

## 2017-07-20 DIAGNOSIS — F319 Bipolar disorder, unspecified: Secondary | ICD-10-CM

## 2017-07-21 ENCOUNTER — Other Ambulatory Visit (HOSPITAL_COMMUNITY): Payer: Self-pay

## 2017-08-17 ENCOUNTER — Ambulatory Visit (INDEPENDENT_AMBULATORY_CARE_PROVIDER_SITE_OTHER): Payer: Medicare Other | Admitting: Family

## 2017-08-17 ENCOUNTER — Ambulatory Visit (INDEPENDENT_AMBULATORY_CARE_PROVIDER_SITE_OTHER)
Admission: RE | Admit: 2017-08-17 | Discharge: 2017-08-17 | Disposition: A | Discharge status: Home / Self Care | Payer: Medicare Other | Source: Ambulatory Visit | Attending: Family | Admitting: Family

## 2017-08-17 ENCOUNTER — Encounter: Payer: Self-pay | Admitting: Family

## 2017-08-17 ENCOUNTER — Telehealth: Payer: Self-pay | Admitting: Family

## 2017-08-17 VITALS — BP 150/86 | HR 90 | Temp 98.1°F | Ht 63.5 in

## 2017-08-17 DIAGNOSIS — G8929 Other chronic pain: Secondary | ICD-10-CM

## 2017-08-17 DIAGNOSIS — M25562 Pain in left knee: Secondary | ICD-10-CM | POA: Diagnosis not present

## 2017-08-17 DIAGNOSIS — E876 Hypokalemia: Secondary | ICD-10-CM | POA: Diagnosis not present

## 2017-08-17 DIAGNOSIS — R29818 Other symptoms and signs involving the nervous system: Secondary | ICD-10-CM | POA: Diagnosis not present

## 2017-08-17 DIAGNOSIS — R42 Dizziness and giddiness: Secondary | ICD-10-CM

## 2017-08-17 DIAGNOSIS — I10 Essential (primary) hypertension: Secondary | ICD-10-CM

## 2017-08-17 DIAGNOSIS — M1712 Unilateral primary osteoarthritis, left knee: Secondary | ICD-10-CM | POA: Diagnosis not present

## 2017-08-17 MED ORDER — POTASSIUM CHLORIDE ER 10 MEQ PO TBCR: 20.0000 meq | EXTENDED_RELEASE_TABLET | Freq: Two times a day (BID) | ORAL | 1 refills | Status: DC

## 2017-08-17 NOTE — Telephone Encounter (Signed)
Informed patient

## 2017-08-17 NOTE — Telephone Encounter (Signed)
Yes, this is fine with me too. Thanks-

## 2017-08-17 NOTE — Patient Instructions (Signed)
Please schedule an eye exam and re-start your blood pressure medication;

## 2017-08-17 NOTE — Telephone Encounter (Signed)
Patient was in the office today and has requested to transfer her care from Dr. Ronnald Ramp to Jodi Mourning. Dr. Ronnald Ramp, is this okay with you? Mickel Baas, is this okay with you?

## 2017-08-17 NOTE — Progress Notes (Signed)
Shannon [name] is a 52 y.o. female with the following history as recorded in EpicCare:  Patient Active Problem List   Diagnosis Date Noted  . Routine general medical examination at a health care facility 12/22/2016  . Sleep apnea 08/12/2016  . Colon cancer screening 01/24/2015  . GERD (gastroesophageal reflux disease) 12/26/2014  . GAD (generalized anxiety disorder) 12/28/2013  . Abnormal vaginal Pap smear 10/25/2013  . Thiamin deficiency 10/30/2012  . Vitamin D deficiency 10/27/2012  . Other abnormal glucose 10/26/2012  . Mixed bipolar I disorder ([place]) 07/12/2012  . Hypokalemia, inadequate intake 09/01/2011  . Insomnia 04/04/2011  . Visit for screening mammogram 02/26/2011  . Hx of laparoscopic gastric banding 09/03/2010  . Tobacco abuse 06/09/2010  . Hyperlipidemia with target LDL less than 130 01/25/2009  . Morbid obesity ([place]) 01/25/2009  . Iron deficiency anemia 01/25/2009  . Essential hypertension 01/25/2009  . Allergic rhinitis 01/25/2009  . [place] DISEASE, LUMBAR 01/25/2009  . Backache 01/25/2009    Current Outpatient Medications  Medication Sig Dispense Refill  . BYSTOLIC 10 MG tablet TAKE 1 TABLET (10 MG TOTAL) BY MOUTH DAILY. 90 tablet 1  . clonazePAM (KLONOPIN) 0.5 MG tablet Take 1 tablet (0.5 mg total) by mouth 3 (three) times daily as needed for anxiety. 90 tablet 1  . ezetimibe-simvastatin (VYTORIN) 10-40 MG tablet TAKE 1 TABLET BY MOUTH DAILY 90 tablet 3  . Multiple Vitamin (MULTIVITAMIN) tablet Take 1 tablet by mouth daily. Reported on 09/09/2015    . NEXIUM 40 MG capsule Take 1 capsule (40 mg total) by mouth 2 (two) times daily before a meal. Brand name only (Patient taking differently: Take 40 mg by mouth 2 (two) times daily as needed (heartburn). Brand name only) 180 capsule 3  . pantoprazole (PROTONIX) 40 MG tablet Take 40 mg by mouth daily.  2  . potassium chloride SA (KLOR-CON M20) 20 MEQ tablet Take 1 tablet (20 mEq total) by mouth 2 (two) times daily. 180  tablet 1  . QUEtiapine (SEROQUEL XR) 300 MG 24 hr tablet Take 1 tablet (300 mg total) by mouth daily. 30 tablet 1  . thiamine ([facility]S B-1) 100 MG tablet Take 1 tablet (100 mg total) daily by mouth. 100 tablet 3  . traZODone (DESYREL) 100 MG tablet Take 2 tablets (200 mg total) by mouth at bedtime. 60 tablet 1  . Vitamin D, Ergocalciferol, (DRISDOL) 50000 units CAPS capsule TAKE ONE CAPSULE BY MOUTH EVERY 7 DAYS 12 capsule 1  . potassium chloride (K-DUR) 10 MEQ tablet Take 2 tablets (20 mEq total) by mouth 2 (two) times daily. 120 tablet 1  . zolpidem (AMBIEN) 10 MG tablet Take 1 tablet (10 mg total) by mouth at bedtime. 30 tablet 1   No current facility-administered medications for this visit.     Allergies: Lamictal [lamotrigine]  Past Medical History:  Diagnosis Date  . Allergic rhinitis   . ANEMIA-IRON DEFICIENCY 01/25/2009  . Anxiety   . Bipolar 1 disorder ([place])   . Chronic pain syndrome   . Complete tear of right rotator cuff 07/21/2013  . Contact lens/glasses fitting   . [place] DISEASE, LUMBAR 01/25/2009  . GERD (gastroesophageal reflux disease)   . HYPERLIPIDEMIA 01/25/2009  . HYPERTENSION 01/25/2009  . MANIC DEPRESSIVE ILLNESS 01/25/2009   pt is unsue if this is her specifc dx  . Morbid obesity ([place]) 01/25/2009  . Night sweats   . PEPTIC ULCER DISEASE, HELICOBACTER PYLORI POSITIVE 10/03/2009    Past Surgical History:  Procedure Laterality Date  .  ABDOMINAL HYSTERECTOMY    . BLADDER SURGERY     s/p with ?diverticulitis  . HAND TENDON SURGERY  1991   s/p-Right-index and middle  . LAPAROSCOPIC GASTRIC BAND REMOVAL WITH LAPAROSCOPIC GASTRIC SLEEVE RESECTION N/A 08/17/2016   Procedure: LAPAROSCOPIC GASTRIC BAND REMOVAL WITH LAPAROSCOPIC GASTRIC SLEEVE RESECTION WITH UPPER ENDO;  Surgeon: Alphonsa Overall, MD;  [facility];  Service: General;  Laterality: N/A;  . LAPAROSCOPIC GASTRIC BANDING  07/14/10  . SHOULDER ARTHROSCOPY Right 07/21/2013   Procedure: RIGHT ARTHROSCOPY SHOULDER  DEBRIDMENT EXTENTSIVE,ARTHROSCOPIC REMOVE LOOSE FOREIGN BODY, BICEPS TENOLYSIS ;  Surgeon: Johnny Bridge, MD;  Location: [facility];  Service: Orthopedics;  Laterality: Right;  . TUBAL LIGATION      Family History  Problem Relation Age of Onset  . Hypertension Mother   . Asthma Mother   . Stroke Father   . Cirrhosis Father        ETOH  . Hypertension Father   . Diabetes Father   . Alcohol abuse Father   . Hypertension Sister   . Asthma Sister   . Hypertension Brother   . ADD / ADHD Son   . Hypertension Paternal Aunt   . Diabetes Maternal Grandmother   . ADD / ADHD Other        3 nephews, also emotional issues  . Diabetes Other        Uncle  . Diabetes Other        Aunt  . Suicidality Neg Hx     Social History   Tobacco Use  . Smoking status: Former Smoker    Packs/day: 0.25    Years: 20.00    Pack years: 5.00    Types: Cigarettes  . Smokeless tobacco: Never Used  . Tobacco comment: States has cut back to 5-7 a day and working on this.   Substance Use Topics  . Alcohol use: No    Alcohol/week: 0.0 oz    Subjective:  Patient presents with concerns for worsening left knee pain; symptoms present "on and off" x 1 year; seems to have worsened and now having daily symptoms in the past few months; notes that can be painful to bear weight/ occasionally feels like her leg is going to give out on her;  Also had sensation of feeling "dizzy and light-headed" yesterday; saw "floaty black specks" initially which then progressed into the sensation of "dizziness." Notes that symptoms have resolved today; Does have history of migraine headache but no history of ocular migraine; denies any flashing lights or sensation of "curtain coming down" across vision; does have history of hypertension- admits she is not taking her medication. Also has chronic hypokalemia- not able to take the potassium daily as prescribed; Denies any chest pain, shortness of breath, blurred vision or  headache today;    Objective:  Vitals:   08/17/17 1048  BP: (!) 150/86  Pulse: 90  Temp: 98.1 F (36.7 C)  TempSrc: Oral  SpO2: 97%  Height: 5' 3.5" (1.613 m)    General: Well developed, well nourished, in no acute distress  Skin : Warm and dry.  Head: Normocephalic and atraumatic  Eyes: Sclera and conjunctiva clear; pupils round and reactive to light; extraocular movements intact  Ears: External normal; canals clear; tympanic membranes normal  Oropharynx: Pink, supple. No suspicious lesions  Neck: Supple without thyromegaly, adenopathy  Lungs: Respirations unlabored; clear to auscultation bilaterally without wheeze, rales, rhonchi  CVS exam: normal rate and regular rhythm.  Musculoskeletal: No deformities; no  active joint inflammation; walks with favored gait Extremities: No edema, cyanosis, clubbing  Vessels: Symmetric bilaterally  Neurologic: Alert and oriented; speech intact; face symmetrical; moves all extremities well; CNII-XII intact without focal deficit   Assessment:  1. Chronic pain of left knee   2. Dizziness   3. Other symptoms and signs involving the nervous system   4. Essential hypertension   5. Hypokalemia, inadequate intake     Plan:  1. Update left knee X-ray; suspect will need MRI; will refer to orthopedics per patient request; 2. Update CBC, CMP today; suspect due to uncontrolled blood pressure- needs to take medication daily;  3. Suspect symptoms yesterday were migraine type headache; will update CT scan however; 4. Uncontrolled- not taking her medication; re-start Bystolic 10 mg daily; 5. Trial of K-dur 10 meq 2 po bid;   Return in about 1 week (around 08/24/2017).  Orders Placed This Encounter  Procedures  . DG Knee Complete 4 Views Left    Standing Status:   Future    Number of Occurrences:   1    Standing Expiration Date:   10/18/2018    Order Specific Question:   Reason for Exam (SYMPTOM  OR DIAGNOSIS REQUIRED)    Answer:   left knee pain     Order Specific Question:   Is patient pregnant?    Answer:   No    Order Specific Question:   Preferred imaging location?    Answer:   [facility]    Order Specific Question:   Radiology Contrast Protocol - do NOT remove file path    Answer:   \\[email or web address]  . CT Head Wo Contrast    Standing Status:   Future    Standing Expiration Date:   11/18/2018    Order Specific Question:   Is patient pregnant?    Answer:   No    Order Specific Question:   Preferred imaging location?    Answer:   [facility]    Order Specific Question:   Radiology Contrast Protocol - do NOT remove file path    Answer:   \\[email or web address].pdf  . CBC w/Diff    Standing Status:   Future    Standing Expiration Date:   08/17/2018  . Comp Met (CMET)    Standing Status:   Future    Standing Expiration Date:   08/17/2018  . TSH    Standing Status:   Future    Standing Expiration Date:   08/17/2018  . B12    Standing Status:   Future    Standing Expiration Date:   08/17/2018  . Ambulatory referral to Orthopedic Surgery    Referral Priority:   Routine    Referral Type:   Surgical    Referral Reason:   Specialty Services Required    Referred to Provider:   Marchia Bond, MD    Requested Specialty:   Orthopedic Surgery    Number of Visits Requested:   1    Requested Prescriptions   Signed Prescriptions Disp Refills  . potassium chloride (K-DUR) 10 MEQ tablet 120 tablet 1    Sig: Take 2 tablets (20 mEq total) by mouth 2 (two) times daily.

## 2017-08-17 NOTE — Telephone Encounter (Signed)
Yes, this is okay with me 

## 2017-08-20 DIAGNOSIS — M1712 Unilateral primary osteoarthritis, left knee: Secondary | ICD-10-CM | POA: Diagnosis not present

## 2017-08-23 ENCOUNTER — Inpatient Hospital Stay: Admission: RE | Admit: 2017-08-23 | Payer: Self-pay | Source: Ambulatory Visit

## 2017-08-25 ENCOUNTER — Ambulatory Visit: Payer: Medicare Other | Admitting: Family

## 2017-08-25 DIAGNOSIS — Z0289 Encounter for other administrative examinations: Secondary | ICD-10-CM

## 2017-09-07 ENCOUNTER — Ambulatory Visit (HOSPITAL_COMMUNITY): Payer: Medicare Other | Admitting: Psychiatry

## 2017-09-07 ENCOUNTER — Other Ambulatory Visit (HOSPITAL_COMMUNITY): Payer: Self-pay

## 2017-09-07 DIAGNOSIS — F319 Bipolar disorder, unspecified: Secondary | ICD-10-CM

## 2017-09-07 MED ORDER — CLONAZEPAM 0.5 MG PO TABS: 0.5000 mg | ORAL_TABLET | Freq: Three times a day (TID) | ORAL | 1 refills | Status: DC | PRN

## 2017-09-07 MED ORDER — ZOLPIDEM TARTRATE 10 MG PO TABS: 10.0000 mg | ORAL_TABLET | Freq: Every day | ORAL | 1 refills | Status: DC

## 2017-09-07 MED ORDER — TRAZODONE HCL 100 MG PO TABS: 200.0000 mg | ORAL_TABLET | Freq: Every day | ORAL | 1 refills | Status: DC

## 2017-09-07 MED ORDER — QUETIAPINE FUMARATE ER 300 MG PO TB24: 300.0000 mg | ORAL_TABLET | Freq: Every day | ORAL | 1 refills | Status: DC

## 2017-09-21 ENCOUNTER — Ambulatory Visit (HOSPITAL_COMMUNITY): Payer: Medicare Other | Admitting: Psychiatry

## 2017-10-18 ENCOUNTER — Other Ambulatory Visit (HOSPITAL_COMMUNITY): Payer: Self-pay

## 2017-10-18 DIAGNOSIS — F319 Bipolar disorder, unspecified: Secondary | ICD-10-CM

## 2017-10-18 MED ORDER — QUETIAPINE FUMARATE ER 300 MG PO TB24: 300.0000 mg | ORAL_TABLET | Freq: Every day | ORAL | 1 refills | Status: DC

## 2017-11-23 ENCOUNTER — Telehealth (HOSPITAL_COMMUNITY): Payer: Self-pay

## 2017-11-23 ENCOUNTER — Other Ambulatory Visit (HOSPITAL_COMMUNITY): Payer: Self-pay | Admitting: Psychiatry

## 2017-11-23 DIAGNOSIS — F319 Bipolar disorder, unspecified: Secondary | ICD-10-CM

## 2017-11-23 MED ORDER — ZOLPIDEM TARTRATE 10 MG PO TABS: 10.0000 mg | ORAL_TABLET | Freq: Every day | ORAL | 0 refills | Status: DC

## 2017-11-23 NOTE — Telephone Encounter (Signed)
Patient called needing a refill on Ambien 10mg . Next appointment is scheduled 12-07-17. Please advise

## 2017-11-23 NOTE — Telephone Encounter (Signed)
[facility]ien 10 mg called into [facility] [address]

## 2017-11-26 ENCOUNTER — Telehealth: Payer: Self-pay

## 2017-11-26 ENCOUNTER — Other Ambulatory Visit (HOSPITAL_COMMUNITY): Payer: Self-pay

## 2017-11-26 DIAGNOSIS — F319 Bipolar disorder, unspecified: Secondary | ICD-10-CM

## 2017-11-26 MED ORDER — CLONAZEPAM 0.5 MG PO TABS: 0.5000 mg | ORAL_TABLET | Freq: Three times a day (TID) | ORAL | 0 refills | Status: DC | PRN

## 2017-11-26 NOTE — Telephone Encounter (Signed)
I spoke with Dr. Casimiro Needle who gave the okay for an early refill on the Ambien and a refill on the Klonopin. I called the pharmacy and spoke with the pharmacist. She will get the medications ready for the patient. I called patient back, it went to voicemail and I left a message letting her know that she can pick up medication today.

## 2017-11-26 NOTE — Telephone Encounter (Signed)
Called patient on 11-24-17 left her a message and told her prescription had been called into pharmacy

## 2017-11-26 NOTE — Telephone Encounter (Signed)
Medication management - Telephone call with pt as she was upset no one was returning her call.  Informed pt she had a [facility] and that Dr. Adele Schilder sent in a new Ambien order 11/23/17.  Patient admitted for one week of the past month she had taken 2 Ambien a night and was now running out and needed to fill the prescription early.  Sp[facility] to [facility], with [place], pharmacist who verified they had her Seroquel XR 300 mg order ready to pick up, that patient needed a new Clonazepam order last filled 11/01/17 and they did have her new Ambien order but was also last filled on 11/01/17 and was too early to refill.  Contacted Dennie Maizes, CMA who will discuss patient's request to fill the Clonazepam and to also authorize her to fill that and her Ambien early as Dr. Adele Schilder is out of the office this date and patient reports not having any until he returns of the Ambien on 10/7 and almost out of Clonazepam.  Dennie Maizes to discuss with Dr. Casimiro Needle and to call patient back this date.

## 2017-12-05 ENCOUNTER — Other Ambulatory Visit: Payer: Self-pay | Admitting: Internal Medicine

## 2017-12-07 ENCOUNTER — Ambulatory Visit (INDEPENDENT_AMBULATORY_CARE_PROVIDER_SITE_OTHER): Payer: Medicare Other | Admitting: Psychiatry

## 2017-12-07 ENCOUNTER — Encounter (HOSPITAL_COMMUNITY): Payer: Self-pay | Admitting: Psychiatry

## 2017-12-07 VITALS — BP 128/80 | HR 86 | Ht 63.5 in | Wt 247.0 lb

## 2017-12-07 DIAGNOSIS — F319 Bipolar disorder, unspecified: Secondary | ICD-10-CM

## 2017-12-07 DIAGNOSIS — F4310 Post-traumatic stress disorder, unspecified: Secondary | ICD-10-CM | POA: Diagnosis not present

## 2017-12-07 DIAGNOSIS — F411 Generalized anxiety disorder: Secondary | ICD-10-CM

## 2017-12-07 MED ORDER — BREXPIPRAZOLE 0.5 MG PO TABS: 0.5000 mg | ORAL_TABLET | Freq: Every day | ORAL | 0 refills | Status: DC

## 2017-12-07 MED ORDER — QUETIAPINE FUMARATE ER 300 MG PO TB24: 300.0000 mg | ORAL_TABLET | Freq: Every day | ORAL | 1 refills | Status: DC

## 2017-12-07 MED ORDER — TRAZODONE HCL 100 MG PO TABS: 100.0000 mg | ORAL_TABLET | Freq: Every day | ORAL | 1 refills | Status: DC

## 2017-12-07 MED ORDER — CLONAZEPAM 0.5 MG PO TABS: 0.5000 mg | ORAL_TABLET | Freq: Three times a day (TID) | ORAL | 0 refills | Status: DC | PRN

## 2017-12-07 MED ORDER — ZOLPIDEM TARTRATE 10 MG PO TABS: 10.0000 mg | ORAL_TABLET | Freq: Every day | ORAL | 0 refills | Status: DC
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
## 2017-12-21 ENCOUNTER — Other Ambulatory Visit (HOSPITAL_COMMUNITY): Payer: Self-pay | Admitting: Psychiatry

## 2017-12-21 DIAGNOSIS — F319 Bipolar disorder, unspecified: Secondary | ICD-10-CM

## 2017-12-23 ENCOUNTER — Other Ambulatory Visit: Payer: Self-pay | Admitting: Internal Medicine

## 2018-01-12 ENCOUNTER — Ambulatory Visit (HOSPITAL_COMMUNITY): Payer: Medicare Other | Admitting: Psychiatry

## 2018-01-17 ENCOUNTER — Ambulatory Visit (INDEPENDENT_AMBULATORY_CARE_PROVIDER_SITE_OTHER): Payer: Medicare Other | Admitting: Psychiatry

## 2018-01-17 ENCOUNTER — Encounter (HOSPITAL_COMMUNITY): Payer: Self-pay | Admitting: Psychiatry

## 2018-01-17 DIAGNOSIS — F319 Bipolar disorder, unspecified: Secondary | ICD-10-CM

## 2018-01-17 DIAGNOSIS — F411 Generalized anxiety disorder: Secondary | ICD-10-CM

## 2018-01-17 DIAGNOSIS — F4310 Post-traumatic stress disorder, unspecified: Secondary | ICD-10-CM

## 2018-01-17 MED ORDER — ZOLPIDEM TARTRATE 10 MG PO TABS: 10.0000 mg | ORAL_TABLET | Freq: Every day | ORAL | 2 refills | Status: DC

## 2018-01-17 MED ORDER — QUETIAPINE FUMARATE ER 300 MG PO TB24: 300.0000 mg | ORAL_TABLET | Freq: Every day | ORAL | 2 refills | Status: DC

## 2018-01-17 MED ORDER — BREXPIPRAZOLE 0.5 MG PO TABS: 1.0000 mg | ORAL_TABLET | Freq: Every day | ORAL | 0 refills | Status: DC

## 2018-01-17 MED ORDER — CLONAZEPAM 0.5 MG PO TABS: 0.5000 mg | ORAL_TABLET | Freq: Three times a day (TID) | ORAL | 2 refills | Status: DC | PRN
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
## 2018-01-27 ENCOUNTER — Ambulatory Visit (HOSPITAL_COMMUNITY): Payer: Medicare Other | Admitting: Licensed Clinical Social Worker

## 2018-02-08 ENCOUNTER — Ambulatory Visit (HOSPITAL_COMMUNITY): Payer: Medicare Other | Admitting: Licensed Clinical Social Worker

## 2018-03-01 ENCOUNTER — Other Ambulatory Visit: Payer: Self-pay | Admitting: Internal Medicine

## 2018-03-01 ENCOUNTER — Other Ambulatory Visit (HOSPITAL_COMMUNITY): Payer: Self-pay | Admitting: Psychiatry

## 2018-03-01 DIAGNOSIS — F4310 Post-traumatic stress disorder, unspecified: Secondary | ICD-10-CM

## 2018-03-01 DIAGNOSIS — F319 Bipolar disorder, unspecified: Secondary | ICD-10-CM

## 2018-03-25 ENCOUNTER — Other Ambulatory Visit: Payer: Self-pay | Admitting: Family

## 2018-03-26 IMAGING — US US ABDOMEN LIMITED
1 series · 14 of 25 positions shown · non-contrast
Comparison: Ultrasound, 02/26/2009 and CT, 06/27/2008.

CLINICAL DATA: All pre bariatric surgery testing.

EXAM:
US ABDOMEN LIMITED - RIGHT UPPER QUADRANT

[Series 1: us abdomen limited · 0.25mm/px · 14 of 64 slices shown]
[im 1/64]
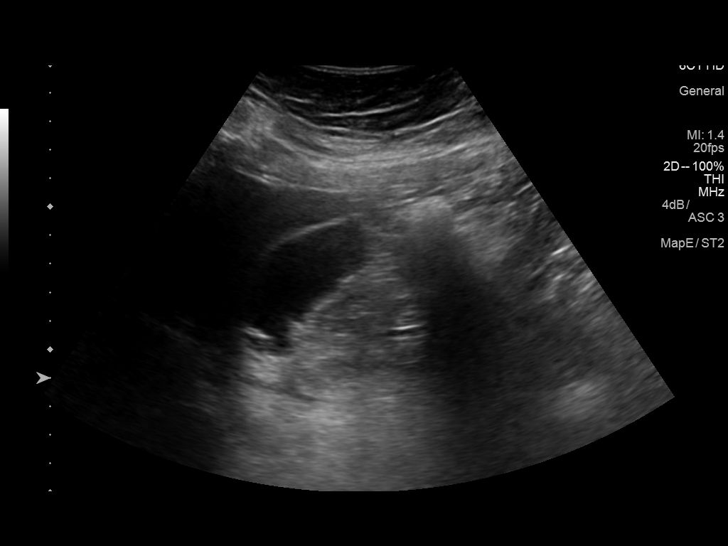
[im 6/64]
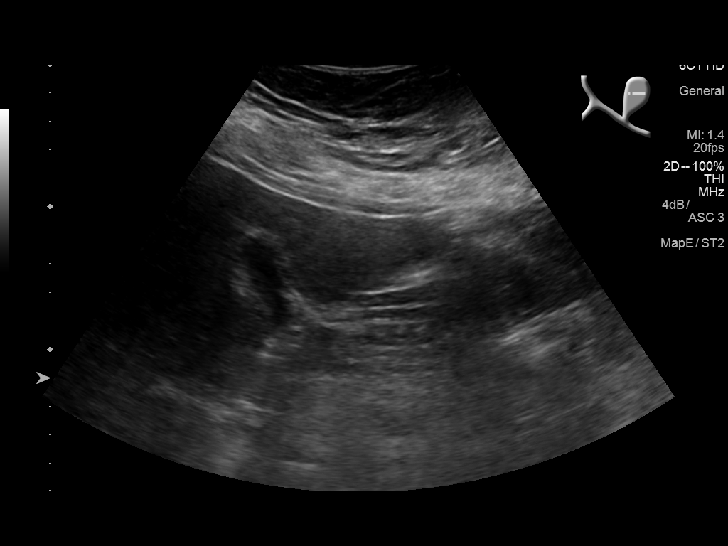
[im 11/64]
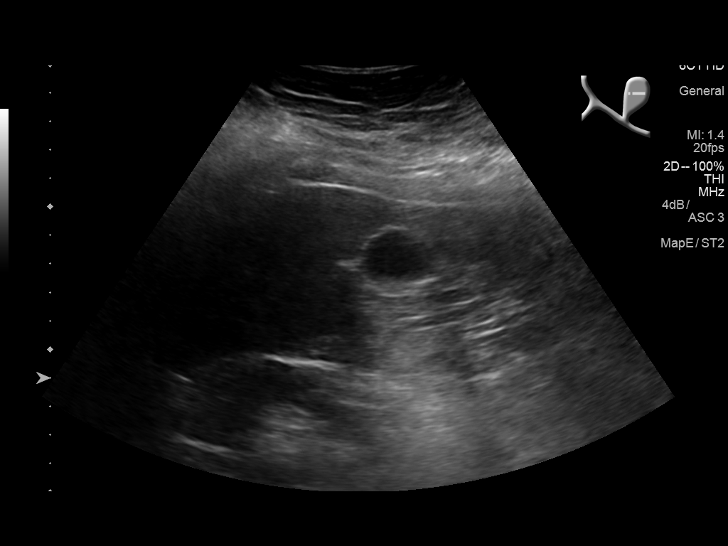
[im 16/64]
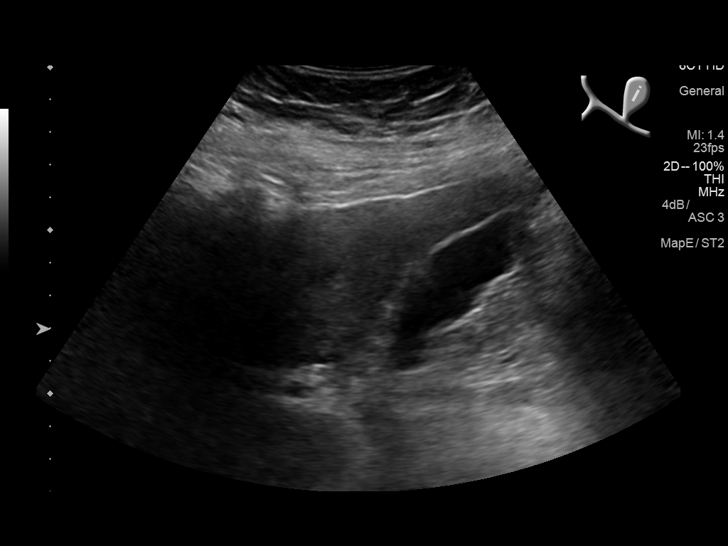
[im 22/64]
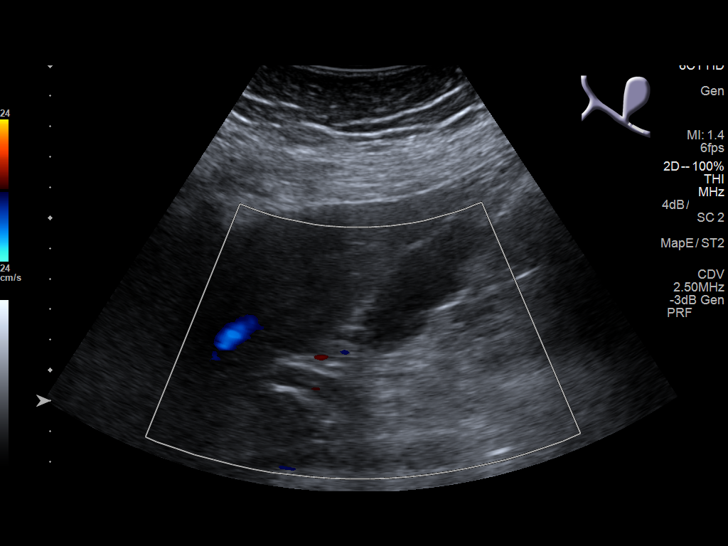
[im 24/64]
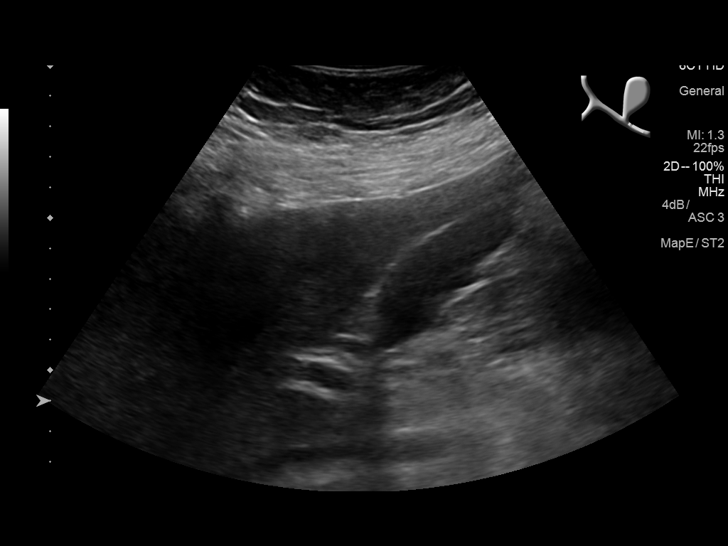
[im 29/64]
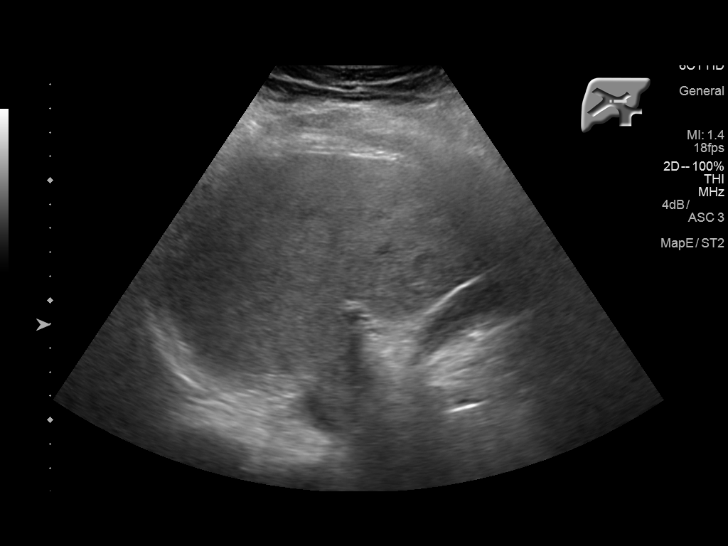
[im 35/64]
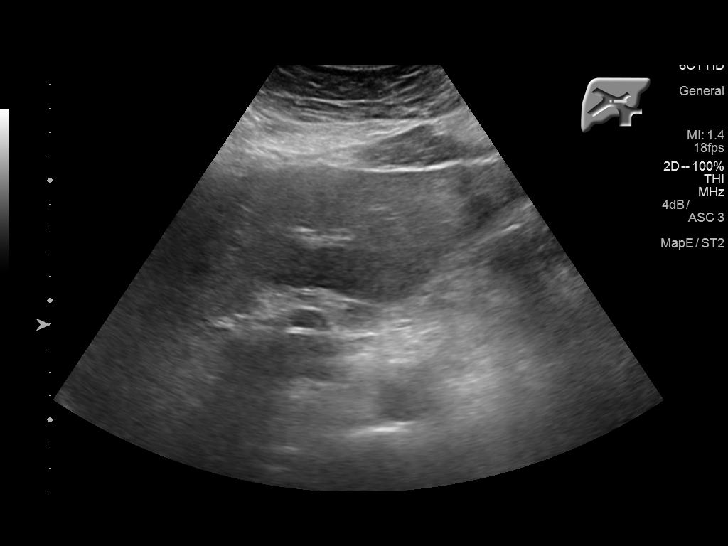
[im 40/64]
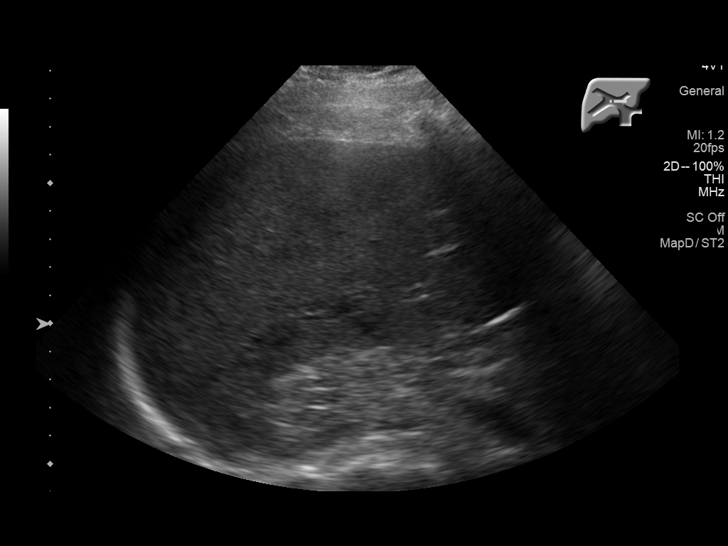
[im 43/64]
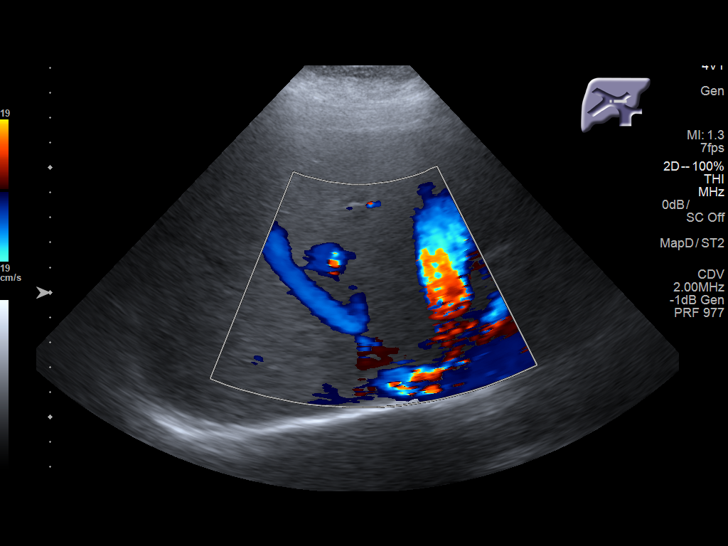
[im 48/64]
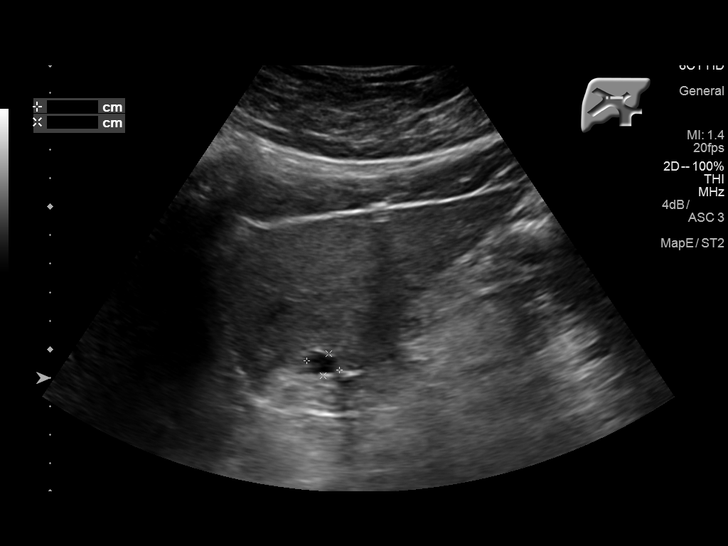
[im 53/64]
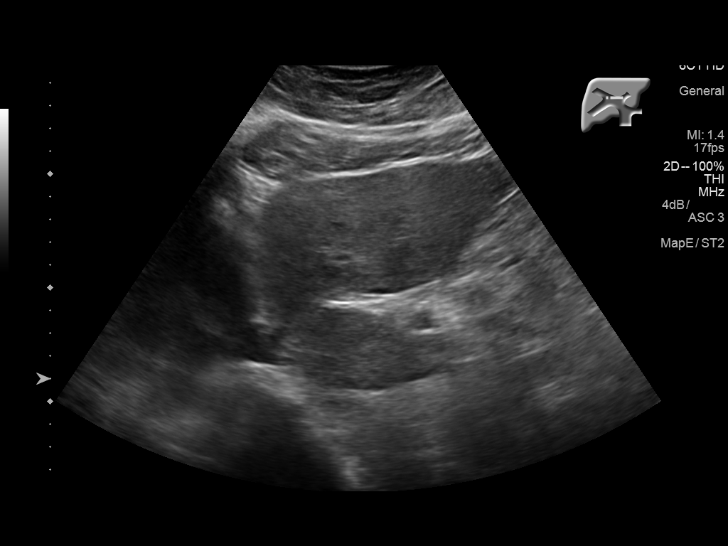
[im 58/64]
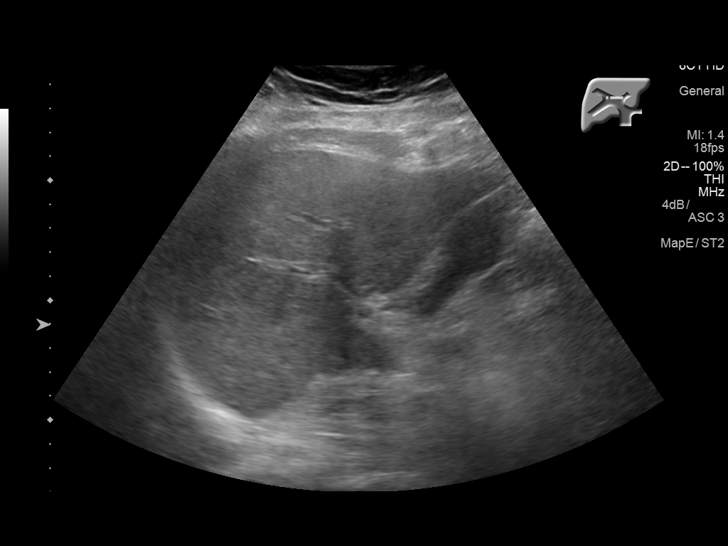
[im 64/64]
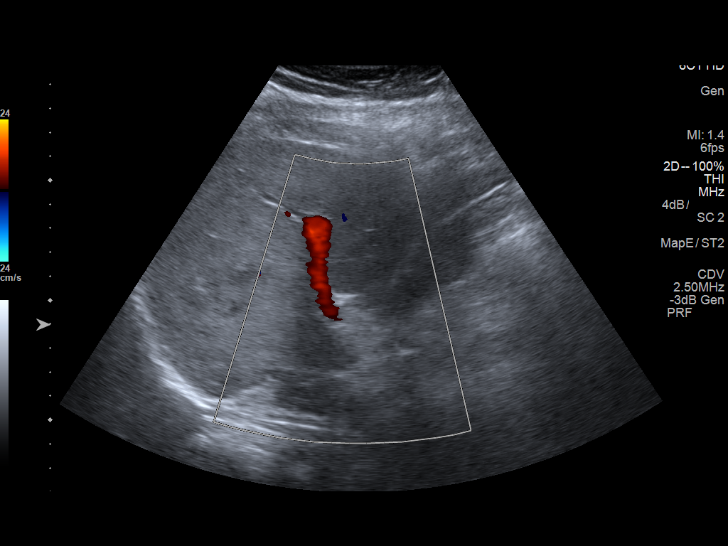

[14 of 25 positions shown; findings below may reference images not displayed]

FINDINGS: Gallbladder:

No gallstones or wall thickening visualized. No sonographic Murphy
sign noted by sonographer.

Common bile duct:

Diameter: 2.2 mm

Liver:

Normal size and overall echogenicity. 12 mm cyst arises from the
inferior right lobe, not seen on the prior studies. No other liver
masses or lesions.
IMPRESSION: 1. No acute findings. Normal appearance of the gallbladder. No bile
duct dilation.
2. Small hepatic cyst.

## 2018-04-19 ENCOUNTER — Encounter (HOSPITAL_COMMUNITY): Payer: Self-pay | Admitting: Psychiatry

## 2018-04-19 ENCOUNTER — Ambulatory Visit (INDEPENDENT_AMBULATORY_CARE_PROVIDER_SITE_OTHER): Payer: Medicare Other | Admitting: Psychiatry

## 2018-04-19 DIAGNOSIS — F319 Bipolar disorder, unspecified: Secondary | ICD-10-CM

## 2018-04-19 DIAGNOSIS — F411 Generalized anxiety disorder: Secondary | ICD-10-CM

## 2018-04-19 DIAGNOSIS — F4310 Post-traumatic stress disorder, unspecified: Secondary | ICD-10-CM

## 2018-04-19 MED ORDER — BREXPIPRAZOLE 1 MG PO TABS: 1.0000 mg | ORAL_TABLET | Freq: Every day | ORAL | 0 refills | Status: DC

## 2018-04-19 MED ORDER — ZOLPIDEM TARTRATE 10 MG PO TABS: 10.0000 mg | ORAL_TABLET | Freq: Every day | ORAL | 2 refills | Status: DC

## 2018-04-19 MED ORDER — CLONAZEPAM 0.5 MG PO TABS: 0.5000 mg | ORAL_TABLET | Freq: Three times a day (TID) | ORAL | 2 refills | Status: DC | PRN

## 2018-04-19 MED ORDER — QUETIAPINE FUMARATE ER 300 MG PO TB24: 300.0000 mg | ORAL_TABLET | Freq: Every day | ORAL | 2 refills | Status: DC
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
## 2018-07-19 ENCOUNTER — Ambulatory Visit (INDEPENDENT_AMBULATORY_CARE_PROVIDER_SITE_OTHER): Payer: Medicare Other | Admitting: Psychiatry

## 2018-07-19 ENCOUNTER — Other Ambulatory Visit: Payer: Self-pay

## 2018-07-19 ENCOUNTER — Encounter (HOSPITAL_COMMUNITY): Payer: Self-pay | Admitting: Psychiatry

## 2018-07-19 ENCOUNTER — Other Ambulatory Visit (HOSPITAL_COMMUNITY): Payer: Self-pay

## 2018-07-19 DIAGNOSIS — F411 Generalized anxiety disorder: Secondary | ICD-10-CM | POA: Diagnosis not present

## 2018-07-19 DIAGNOSIS — F4310 Post-traumatic stress disorder, unspecified: Secondary | ICD-10-CM

## 2018-07-19 DIAGNOSIS — F319 Bipolar disorder, unspecified: Secondary | ICD-10-CM

## 2018-07-19 MED ORDER — QUETIAPINE FUMARATE ER 300 MG PO TB24: 300.0000 mg | ORAL_TABLET | Freq: Every day | ORAL | 2 refills | Status: DC

## 2018-07-19 MED ORDER — BREXPIPRAZOLE 1 MG PO TABS: 1.0000 mg | ORAL_TABLET | Freq: Every day | ORAL | 0 refills | Status: DC

## 2018-07-19 MED ORDER — ZOLPIDEM TARTRATE 10 MG PO TABS: 10.0000 mg | ORAL_TABLET | Freq: Every day | ORAL | 2 refills | Status: DC

## 2018-07-19 MED ORDER — BREXPIPRAZOLE 0.5 MG PO TABS: 1.0000 mg | ORAL_TABLET | Freq: Every day | ORAL | 0 refills | Status: DC

## 2018-07-19 MED ORDER — CLONAZEPAM 0.5 MG PO TABS: 0.5000 mg | ORAL_TABLET | Freq: Three times a day (TID) | ORAL | 2 refills | Status: DC | PRN
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
## 2018-08-22 ENCOUNTER — Telehealth (HOSPITAL_COMMUNITY): Payer: Self-pay

## 2018-08-22 DIAGNOSIS — F319 Bipolar disorder, unspecified: Secondary | ICD-10-CM

## 2018-08-22 DIAGNOSIS — F4310 Post-traumatic stress disorder, unspecified: Secondary | ICD-10-CM

## 2018-08-22 DIAGNOSIS — F411 Generalized anxiety disorder: Secondary | ICD-10-CM

## 2018-08-22 MED ORDER — REXULTI 1 MG PO TABS: 1.0000 mg | ORAL_TABLET | Freq: Every day | ORAL | 0 refills | Status: DC

## 2018-08-22 NOTE — Telephone Encounter (Signed)
Patient called for samples of Rexulti last week, I did not have any samples and told her that I would call the rep and I should have samples by Friday. Maudie Mercury came with the samples on Thursday. I called patient Thursday afternoon and I was not able to leave a message. I did not call Friday, and patient did not call me. She called again this morning and inquired about the medication, I advised her that I had it and that I had tried to call her on Thursday, patient said that I did not try to call her and was upset. I told patient that I did try to call, and that I had samples for her if she would like to come and pick up. I also printed out a patient assistance application. When patient came for her samples and I was talking to her she refused to look at me and would not acknowledge what I was saying. Patient took the bag of medication and the forms and walked away while I was still talking.

## 2018-08-30 ENCOUNTER — Telehealth: Payer: Self-pay | Admitting: Family

## 2018-08-30 NOTE — Telephone Encounter (Signed)
Sent patient message via my-chart. Will call her later to set up appointment if able to reach her.

## 2018-08-30 NOTE — Telephone Encounter (Signed)
Please let her know that she is due for OV; last seen here in 07/2017; not able to refill potassium request for a medication last written in January 2020.

## 2018-09-01 ENCOUNTER — Other Ambulatory Visit: Payer: Self-pay

## 2018-09-01 ENCOUNTER — Ambulatory Visit (INDEPENDENT_AMBULATORY_CARE_PROVIDER_SITE_OTHER): Payer: Medicare Other | Admitting: Family Medicine

## 2018-09-01 ENCOUNTER — Other Ambulatory Visit (INDEPENDENT_AMBULATORY_CARE_PROVIDER_SITE_OTHER): Payer: Medicare Other

## 2018-09-01 ENCOUNTER — Ambulatory Visit (INDEPENDENT_AMBULATORY_CARE_PROVIDER_SITE_OTHER)
Admission: RE | Admit: 2018-09-01 | Discharge: 2018-09-01 | Disposition: A | Discharge status: Home / Self Care | Payer: Medicare Other | Source: Ambulatory Visit | Attending: Family Medicine | Admitting: Family Medicine

## 2018-09-01 VITALS — BP 140/80 | HR 101 | Ht 64.0 in | Wt 283.0 lb

## 2018-09-01 DIAGNOSIS — M549 Dorsalgia, unspecified: Secondary | ICD-10-CM

## 2018-09-01 DIAGNOSIS — M2550 Pain in unspecified joint: Secondary | ICD-10-CM | POA: Diagnosis not present

## 2018-09-01 DIAGNOSIS — G5703 Lesion of sciatic nerve, bilateral lower limbs: Secondary | ICD-10-CM

## 2018-09-01 LAB — COMPREHENSIVE METABOLIC PANEL
ALT: 8 U/L (ref 0–35)
AST: 8 U/L (ref 0–37)
Albumin: 3.7 g/dL (ref 3.5–5.2)
Alkaline Phosphatase: 94 U/L (ref 39–117)
BUN: 6 mg/dL (ref 6–23)
CO2: 24 mEq/L (ref 19–32)
Calcium: 7.9 mg/dL — ABNORMAL LOW (ref 8.4–10.5)
Chloride: 107 mEq/L (ref 96–112)
Creatinine, Ser: 0.56 mg/dL (ref 0.40–1.20)
GFR: 136.4 mL/min (ref >60.00)
Glucose, Bld: 89 mg/dL (ref 70–99)
Potassium: 3.2 mEq/L — ABNORMAL LOW (ref 3.5–5.1)
Sodium: 139 mEq/L (ref 135–145)
Total Bilirubin: 0.5 mg/dL (ref 0.2–1.2)
Total Protein: 7.3 g/dL (ref 6.0–8.3)

## 2018-09-01 LAB — C-REACTIVE PROTEIN: CRP: 1.1 mg/dL (ref 0.5–20.0)

## 2018-09-01 LAB — CBC WITH DIFFERENTIAL/PLATELET
Basophils Absolute: 0.1 10*3/uL (ref 0.0–0.1)
Basophils Relative: 1.9 % (ref 0.0–3.0)
Eosinophils Absolute: 0.2 10*3/uL (ref 0.0–0.7)
Eosinophils Relative: 2.5 % (ref 0.0–5.0)
HCT: 40.2 % (ref 36.0–46.0)
Hemoglobin: 13.3 g/dL (ref 12.0–15.0)
Lymphocytes Relative: 27.2 % (ref 12.0–46.0)
Lymphs Abs: 1.7 10*3/uL (ref 0.7–4.0)
MCHC: 33.2 g/dL (ref 30.0–36.0)
MCV: 88.7 fl (ref 78.0–100.0)
Monocytes Absolute: 0.3 10*3/uL (ref 0.1–1.0)
Monocytes Relative: 4.1 % (ref 3.0–12.0)
Neutro Abs: 4 10*3/uL (ref 1.4–7.7)
Neutrophils Relative %: 64.3 % (ref 43.0–77.0)
Platelets: 284 10*3/uL (ref 150.0–400.0)
RBC: 4.54 Mil/uL (ref 3.87–5.11)
RDW: 14.9 % (ref 11.5–15.5)
WBC: 6.2 10*3/uL (ref 4.0–10.5)

## 2018-09-01 LAB — URIC ACID: Uric Acid, Serum: 3.7 mg/dL (ref 2.4–7.0)

## 2018-09-01 LAB — IBC PANEL
Iron: 45 ug/dL (ref 42–145)
Saturation Ratios: 13.6 % — ABNORMAL LOW (ref 20.0–50.0)
Transferrin: 236 mg/dL (ref 212.0–360.0)

## 2018-09-01 LAB — SEDIMENTATION RATE: Sed Rate: 69 mm/hr — ABNORMAL HIGH (ref 0–30)

## 2018-09-01 LAB — FERRITIN: Ferritin: 20.9 ng/mL (ref 10.0–291.0)

## 2018-09-01 LAB — TSH: TSH: 2.41 u[IU]/mL (ref 0.35–4.50)

## 2018-09-01 MED ORDER — VITAMIN D (ERGOCALCIFEROL) 1.25 MG (50000 UNIT) PO CAPS: 50000.0000 [IU] | ORAL_CAPSULE | ORAL | 0 refills | Status: DC

## 2018-09-01 NOTE — Patient Instructions (Signed)
Good to see you.  Ice 20 minutes 2 times daily. Usually after activity and before bed. Exercises 3 times a week.  Turmeric 500mg  daily  Tart cherry extract 1200mg  at night Vitamin D 2000 IU daily  See me again in 5-6 weeks

## 2018-09-01 NOTE — Progress Notes (Signed)
[name] Shannon [name], [place] [address] Phone: [phone] Subjective:   I Shannon [name] am serving as a Education administrator for Dr. Hulan Saas.  I'm seeing this patient by the request  of:    CC: back pain   GYF:[id number]  Shannon [name] is a 53 y.o. female coming in with complaint of back pain. Pain after a trip she took. States she felt like her tailbone was cracked. History of back injury. Was told that if she managed her weight the pain could be managed better. Bottom of her heels are tingly with sharp pains in the morning. Stinging burning sensation in back of thighs also. 1.5 hour flight causes pain down her back into her thighs. Recently ran out of blood pressure.   Onset- 2-2.5 months  Location - Tailbone and lower back  Duration-  Character- sharp  Aggravating factors- sitting on hard surfaces for long periods of time Reliving factors-ice Therapies tried- topical, ice, aleve  Severity-8 out of 10     Past Medical History:  Diagnosis Date  . Allergic rhinitis   . ANEMIA-IRON DEFICIENCY 01/25/2009  . Anxiety   . Bipolar 1 disorder ([place])   . Chronic pain syndrome   . Complete tear of right rotator cuff 07/21/2013  . Contact lens/glasses fitting   . [place] DISEASE, LUMBAR 01/25/2009  . GERD (gastroesophageal reflux disease)   . HYPERLIPIDEMIA 01/25/2009  . HYPERTENSION 01/25/2009  . MANIC DEPRESSIVE ILLNESS 01/25/2009   pt is unsue if this is her specifc dx  . Morbid obesity ([place]) 01/25/2009  . Night sweats   . PEPTIC ULCER DISEASE, HELICOBACTER PYLORI POSITIVE 10/03/2009   Past Surgical History:  Procedure Laterality Date  . ABDOMINAL HYSTERECTOMY    . BLADDER SURGERY     s/p with ?diverticulitis  . HAND TENDON SURGERY  1991   s/p-Right-index and middle  . LAPAROSCOPIC GASTRIC BAND REMOVAL WITH LAPAROSCOPIC GASTRIC SLEEVE RESECTION N/A 08/17/2016   Procedure: LAPAROSCOPIC GASTRIC BAND REMOVAL WITH LAPAROSCOPIC GASTRIC SLEEVE RESECTION  WITH UPPER ENDO;  Surgeon: Alphonsa Overall, MD;  Location: [facility];  Service: General;  Laterality: N/A;  . LAPAROSCOPIC GASTRIC BANDING  07/14/10  . SHOULDER ARTHROSCOPY Right 07/21/2013   Procedure: RIGHT ARTHROSCOPY SHOULDER DEBRIDMENT EXTENTSIVE,ARTHROSCOPIC REMOVE LOOSE FOREIGN BODY, BICEPS TENOLYSIS ;  Surgeon: Johnny Bridge, MD;  Location: [facility];  Service: Orthopedics;  Laterality: Right;  . TUBAL LIGATION     Social History   Socioeconomic History  . Marital status: Married    Spouse name: Not on file  . Number of children: 2  . Years of education: Not on file  . Highest education level: Not on file  Occupational History  . Occupation: Lexicographer: UNEMPLOYED  Social Needs  . Financial resource strain: Not on file  . Food insecurity    Worry: Not on file    Inability: Not on file  . Transportation needs    Medical: Not on file    Non-medical: Not on file  Tobacco Use  . Smoking status: Current Every Day Smoker    Packs/day: 0.25    Years: 20.00    Pack years: 5.00    Types: Cigarettes  . Smokeless tobacco: Never Used  . Tobacco comment: States has cut back to 5-7 a day and working on this.   Substance and Sexual Activity  . Alcohol use: No    Alcohol/week: 0.0 standard drinks  . Drug use: No  .  Sexual activity: Not Currently  Lifestyle  . Physical activity    Days per week: Not on file    Minutes per session: Not on file  . Stress: Not on file  Relationships  . Social Herbalist on phone: Not on file    Gets together: Not on file    Attends religious service: Not on file    Active member of club or organization: Not on file    Attends meetings of clubs or organizations: Not on file    Relationship status: Not on file  Other Topics Concern  . Not on file  Social History Narrative   Moved from [place], then [place] to [place] 2009   2 children-1 boy, 1 girl   Disabled-bipolar   Daily Caffeine Use-1 cup/day    Allergies  Allergen Reactions  . Lamictal [Lamotrigine] Rash   Family History  Problem Relation Age of Onset  . Hypertension Mother   . Asthma Mother   . Stroke Father   . Cirrhosis Father        ETOH  . Hypertension Father   . Diabetes Father   . Alcohol abuse Father   . Hypertension Sister   . Asthma Sister   . Hypertension Brother   . ADD / ADHD Son   . Hypertension Paternal Aunt   . Diabetes Maternal Grandmother   . ADD / ADHD Other        3 nephews, also emotional issues  . Diabetes Other        Uncle  . Diabetes Other        Aunt  . Suicidality Neg Hx      Current Outpatient Medications (Cardiovascular):  .  BYSTOLIC 10 MG tablet, TAKE 1 TABLET (10 MG TOTAL) BY MOUTH DAILY. Marland Kitchen  ezetimibe-simvastatin (VYTORIN) 10-40 MG tablet, TAKE 1 TABLET BY MOUTH DAILY     Current Outpatient Medications (Other):  Marland Kitchen  Brexpiprazole (REXULTI) 1 MG TABS, Take 1 tablet (1 mg total) by mouth at bedtime. .  clonazePAM (KLONOPIN) 0.5 MG tablet, Take 1 tablet (0.5 mg total) by mouth 3 (three) times daily as needed for anxiety. .  Multiple Vitamin (MULTIVITAMIN) tablet, Take 1 tablet by mouth daily. Reported on 09/09/2015 .  NEXIUM 40 MG capsule, Take 1 capsule (40 mg total) by mouth 2 (two) times daily before a meal. Brand name only (Patient taking differently: Take 40 mg by mouth 2 (two) times daily as needed (heartburn). Brand name only) .  pantoprazole (PROTONIX) 40 MG tablet, Take 40 mg by mouth daily. .  potassium chloride (K-DUR) 10 MEQ tablet, TAKE 2 TABLETS (20 MEQ TOTAL) BY MOUTH 2 (TWO) TIMES DAILY. Marland Kitchen  potassium chloride SA (KLOR-CON M20) 20 MEQ tablet, Take 1 tablet (20 mEq total) by mouth 2 (two) times daily. .  QUEtiapine (SEROQUEL XR) 300 MG 24 hr tablet, Take 1 tablet (300 mg total) by mouth daily. Marland Kitchen  thiamine ([facility] B-1) 100 MG tablet, Take 1 tablet (100 mg total) daily by mouth. .  Vitamin D, Ergocalciferol, (DRISDOL) 50000 units CAPS capsule, TAKE ONE CAPSULE BY MOUTH  EVERY 7 DAYS .  Vitamin D, Ergocalciferol, (DRISDOL) 1.25 MG (50000 UT) CAPS capsule, Take 1 capsule (50,000 Units total) by mouth every 7 (seven) days. Marland Kitchen  zolpidem (AMBIEN) 10 MG tablet, Take 1 tablet (10 mg total) by mouth at bedtime for 30 days.    Past medical history, social, surgical and family history all reviewed in electronic medical record.  No pertanent information unless stated regarding to the chief complaint.   Review of Systems:  No headache, visual changes, nausea, vomiting, diarrhea, constipation, dizziness, abdominal pain, skin rash, fevers, chills, night sweats, weight loss, swollen lymph nodes, body aches, joint swelling, , chest pain, shortness of breath, mood changes.  Positive muscle aches  Objective  Blood pressure 140/80, pulse (!) 101, height 5\' 4"  (1.626 m), weight 283 lb (128.4 kg), SpO2 98 %.    General: No apparent distress alert and oriented x3 mood and affect normal, dressed appropriately.  HEENT: Pupils equal, extraocular movements intact  Respiratory: Patient's speak in full sentences and does not appear short of breath  Cardiovascular: No lower extremity edema, non tender, no erythema  Skin: Warm dry intact with no signs of infection or rash on extremities or on axial skeleton.  Abdomen: Soft nontender  Neuro: Cranial nerves II through XII are intact, neurovascularly intact in all extremities with 2+ DTRs and 2+ pulses.  Lymph: No lymphadenopathy of posterior or anterior cervical chain or axillae bilaterally.  Gait normal with good balance and coordination.  MSK:  Non tender with full range of motion and good stability and symmetric strength and tone of shoulders, elbows, wrist, hip, knee and ankles bilaterally.  Back Exam:  Inspection: Unremarkable poor core strength Motion: Flexion 40 deg, Extension 15 deg, Side Bending to 35 deg bilaterally,  Rotation to 25 deg bilaterally  SLR laying: Negative  XSLR laying: Negative  Palpable tenderness: Tender to  palpation of paraspinal musculature lumbar spine right greater than severe tenderness over the piriformis bilaterally. FABER: Positive bilaterally. Sensory change: Gross sensation intact to all lumbar and sacral dermatomes.  Reflexes: 2+ at both patellar tendons, 2+ at achilles tendons, Babinski's downgoing.  Strength at foot  Plantar-flexion: 5/5 Dorsi-flexion: 5/5 Eversion: 5/5 Inversion: 5/5  Leg strength  Quad: 5/5 Hamstring: 5/5 Hip flexor: 5/5 Hip abductors: 4/5    [id number]; 15 additional minutes spent for Therapeutic exercises as stated in above notes.  This included exercises focusing on stretching, strengthening, with significant focus on eccentric aspects.   Long term goals include an improvement in range of motion, strength, endurance as well as avoiding reinjury. Patient's frequency would include in 1-2 times a day, 3-5 times a week for a duration of 6-12 weeks. Low back exercises that included:  Pelvic tilt/bracing instruction to focus on control of the pelvic girdle and lower abdominal muscles  Glute strengthening exercises, focusing on proper firing of the glutes without engaging the low back muscles Proper stretching techniques for maximum relief for the hamstrings, hip flexors, low back and some rotation where tolerated   Proper technique shown and discussed handout in great detail with ATC.  All questions were discussed and answered.      Impression and Recommendations:     This case required medical decision making of moderate complexity. The above documentation has been reviewed and is accurate and complete Lyndal Pulley, DO       Note: This dictation was prepared with Dragon dictation along with smaller phrase technology. Any transcriptional errors that result from this process are unintentional.

## 2018-09-01 NOTE — Telephone Encounter (Signed)
Spoke with patient today regarding refill. Informed patient we would need to see her for future refills since she hasn't been seen last last year. Patient reports she received no-show fee of 50.00 for appointment she did not make and had been turned over to collections. She was dealing with collection agency to see about disputing the charge. In the meantime patient admits she has not been back in due to stress of receiving no-show letter and with trying to purchase a home she did not need this messing up her credit. She says that due to the stress she knows her BP has been up and she has been without medication. She knows that by not coming in and allowing stress of bill to prevent her from coming in is affecting her health. Upon speaking with patient I did inform her that in order to practice good medicine she must come in the office from time to time to be seen and we needed to see her back in the office to check her blood pressure and to also make sure that what she was on was appropriate with regulating her blood pressure. I did ask patient if she had spoken with anyone here regarding the no-show fee she stated no. I advised that she may be able to speak with office manager to see what could be worked out. Patient has appointment to come in today to see Dr. Tamala Julian. Patient at first was upset that she said she wanted to see an MD and was not aware that Mickel Baas was not the same as Dr. Ronnald Ramp. She had originally seen Dr. Ronnald Ramp but had requested a female MD since she is Muslim.  I did gather from speaking with patient that she was in-fact upset about the no-show fee and not actually Mickel Baas being an NP. Patient at the end of conversation did want to come back in and see Mickel Baas as she really wants to get health and BP under control. She understands the bigger picture is her health right now and we need to see her in the office. I made appointment on 10/05/18 at 11:00 am per her request. Appointment card mailed out to her  today with date/time of appointment. She understands if she wants to come in sooner she can call and move appointment up. Patient did state as well she would be going out of town and would return on 10/03/18.

## 2018-09-02 ENCOUNTER — Encounter: Payer: Self-pay | Admitting: Family Medicine

## 2018-09-02 DIAGNOSIS — G5703 Lesion of sciatic nerve, bilateral lower limbs: Secondary | ICD-10-CM | POA: Insufficient documentation

## 2018-09-02 HISTORY — DX: Lesion of sciatic nerve, bilateral lower limbs: G57.03

## 2018-09-02 NOTE — Assessment & Plan Note (Signed)
Patient's signs and symptoms seem to be more corresponded with piriformis syndrome that is likely secondary to muscle imbalances and weakness of the lateral aspects of the hip.  Patient does not have radiation and negative straight leg test.  Patient work with athletic trainer to learn home exercises in greater detail.  Patient to me for future consideration need formal physical therapy.  X-rays of back ordered today to rule out any degenerative disc disease that could be contributing to lumbar radiculopathy.  Patient will follow-up with me again in 6 weeks

## 2018-09-09 LAB — ANGIOTENSIN CONVERTING ENZYME: Angiotensin-Converting Enzyme: <5 U/L — ABNORMAL LOW (ref 9–67)

## 2018-09-09 LAB — VITAMIN D 1,25 DIHYDROXY
Vitamin D 1, 25 (OH)2 Total: 81 pg/mL — ABNORMAL HIGH (ref 18–72)
Vitamin D2 1, 25 (OH)2: 61 pg/mL
Vitamin D3 1, 25 (OH)2: 20 pg/mL

## 2018-09-09 LAB — CALCIUM, IONIZED: Calcium, Ion: 4.62 mg/dL — ABNORMAL LOW (ref 4.8–5.6)

## 2018-09-09 LAB — PTH, INTACT AND CALCIUM
Calcium: 8.1 mg/dL — ABNORMAL LOW (ref 8.6–10.4)
PTH: 196 pg/mL — ABNORMAL HIGH (ref 14–64)

## 2018-09-09 LAB — RHEUMATOID FACTOR: Rheumatoid fact SerPl-aCnc: <14 IU/mL (ref <14)

## 2018-09-09 LAB — ANA: Anti Nuclear Antibody (ANA): NEGATIVE

## 2018-09-09 LAB — CYCLIC CITRUL PEPTIDE ANTIBODY, IGG: Cyclic Citrullin Peptide Ab: <16 UNITS

## 2018-09-12 ENCOUNTER — Other Ambulatory Visit: Payer: Self-pay

## 2018-09-12 DIAGNOSIS — R7989 Other specified abnormal findings of blood chemistry: Secondary | ICD-10-CM

## 2018-09-30 ENCOUNTER — Telehealth: Payer: Self-pay | Admitting: Family

## 2018-09-30 DIAGNOSIS — E876 Hypokalemia: Secondary | ICD-10-CM

## 2018-09-30 MED ORDER — POTASSIUM CHLORIDE CRYS ER 20 MEQ PO TBCR: 20.0000 meq | EXTENDED_RELEASE_TABLET | Freq: Every day | ORAL | 0 refills | Status: DC

## 2018-09-30 NOTE — Telephone Encounter (Signed)
I can see that her potassium was low with the recent set of labs that she had done with Dr. Tamala Julian. I am going to send in 1 month of the potassium for her but we cannot continue to write this for her without OV. She also needs to make sure to see endocrinology as he recommended.

## 2018-10-05 ENCOUNTER — Encounter: Payer: Self-pay | Admitting: Family

## 2018-10-05 ENCOUNTER — Ambulatory Visit: Payer: Medicare Other | Admitting: Family

## 2018-10-11 ENCOUNTER — Ambulatory Visit: Payer: Medicare Other | Admitting: Family

## 2018-10-11 DIAGNOSIS — Z0289 Encounter for other administrative examinations: Secondary | ICD-10-CM

## 2018-10-18 ENCOUNTER — Other Ambulatory Visit: Payer: Self-pay

## 2018-10-18 ENCOUNTER — Ambulatory Visit (INDEPENDENT_AMBULATORY_CARE_PROVIDER_SITE_OTHER): Payer: Medicare Other | Admitting: Psychiatry

## 2018-10-18 ENCOUNTER — Encounter (HOSPITAL_COMMUNITY): Payer: Self-pay | Admitting: Psychiatry

## 2018-10-18 ENCOUNTER — Other Ambulatory Visit (HOSPITAL_COMMUNITY): Payer: Self-pay

## 2018-10-18 DIAGNOSIS — F319 Bipolar disorder, unspecified: Secondary | ICD-10-CM

## 2018-10-18 DIAGNOSIS — F4310 Post-traumatic stress disorder, unspecified: Secondary | ICD-10-CM

## 2018-10-18 DIAGNOSIS — F411 Generalized anxiety disorder: Secondary | ICD-10-CM

## 2018-10-18 MED ORDER — REXULTI 1 MG PO TABS: 1.0000 mg | ORAL_TABLET | Freq: Every day | ORAL | 0 refills | Status: DC

## 2018-10-18 MED ORDER — CLONAZEPAM 0.5 MG PO TABS: 0.5000 mg | ORAL_TABLET | Freq: Three times a day (TID) | ORAL | 1 refills | Status: DC | PRN

## 2018-10-18 MED ORDER — QUETIAPINE FUMARATE ER 200 MG PO TB24: 200.0000 mg | ORAL_TABLET | Freq: Every day | ORAL | 1 refills | Status: DC

## 2018-10-18 MED ORDER — ZOLPIDEM TARTRATE 10 MG PO TABS: 10.0000 mg | ORAL_TABLET | Freq: Every day | ORAL | 1 refills | Status: DC
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
## 2018-10-19 ENCOUNTER — Ambulatory Visit (HOSPITAL_COMMUNITY): Payer: Medicare Other | Admitting: Psychiatry

## 2018-11-08 ENCOUNTER — Encounter: Payer: Self-pay | Admitting: Family

## 2018-11-08 ENCOUNTER — Telehealth: Payer: Self-pay | Admitting: Family

## 2018-11-08 ENCOUNTER — Ambulatory Visit (INDEPENDENT_AMBULATORY_CARE_PROVIDER_SITE_OTHER): Payer: Medicare Other | Admitting: Family

## 2018-11-08 ENCOUNTER — Other Ambulatory Visit (INDEPENDENT_AMBULATORY_CARE_PROVIDER_SITE_OTHER): Payer: Medicare Other

## 2018-11-08 ENCOUNTER — Other Ambulatory Visit: Payer: Self-pay

## 2018-11-08 VITALS — BP 150/98 | HR 93 | Temp 98.2°F | Ht 64.0 in | Wt 276.6 lb

## 2018-11-08 DIAGNOSIS — E519 Thiamine deficiency, unspecified: Secondary | ICD-10-CM

## 2018-11-08 DIAGNOSIS — Z9119 Patient's noncompliance with other medical treatment and regimen: Secondary | ICD-10-CM

## 2018-11-08 DIAGNOSIS — E785 Hyperlipidemia, unspecified: Secondary | ICD-10-CM

## 2018-11-08 DIAGNOSIS — R0609 Other forms of dyspnea: Secondary | ICD-10-CM

## 2018-11-08 DIAGNOSIS — I10 Essential (primary) hypertension: Secondary | ICD-10-CM

## 2018-11-08 DIAGNOSIS — R0600 Dyspnea, unspecified: Secondary | ICD-10-CM

## 2018-11-08 DIAGNOSIS — R7989 Other specified abnormal findings of blood chemistry: Secondary | ICD-10-CM

## 2018-11-08 DIAGNOSIS — Z23 Encounter for immunization: Secondary | ICD-10-CM | POA: Diagnosis not present

## 2018-11-08 DIAGNOSIS — Z91199 Patient's noncompliance with other medical treatment and regimen due to unspecified reason: Secondary | ICD-10-CM

## 2018-11-08 LAB — COMPREHENSIVE METABOLIC PANEL
ALT: 11 U/L (ref 0–35)
AST: 11 U/L (ref 0–37)
Albumin: 3.8 g/dL (ref 3.5–5.2)
Alkaline Phosphatase: 97 U/L (ref 39–117)
BUN: 8 mg/dL (ref 6–23)
CO2: 28 mEq/L (ref 19–32)
Calcium: 8.9 mg/dL (ref 8.4–10.5)
Chloride: 106 mEq/L (ref 96–112)
Creatinine, Ser: 0.69 mg/dL (ref 0.40–1.20)
GFR: 107.13 mL/min (ref >60.00)
Glucose, Bld: 85 mg/dL (ref 70–99)
Potassium: 3.2 mEq/L — ABNORMAL LOW (ref 3.5–5.1)
Sodium: 141 mEq/L (ref 135–145)
Total Bilirubin: 0.4 mg/dL (ref 0.2–1.2)
Total Protein: 7.7 g/dL (ref 6.0–8.3)

## 2018-11-08 LAB — CBC WITH DIFFERENTIAL/PLATELET
Basophils Absolute: 0.1 10*3/uL (ref 0.0–0.1)
Basophils Relative: 1.1 % (ref 0.0–3.0)
Eosinophils Absolute: 0.1 10*3/uL (ref 0.0–0.7)
Eosinophils Relative: 1.6 % (ref 0.0–5.0)
HCT: 41 % (ref 36.0–46.0)
Hemoglobin: 14 g/dL (ref 12.0–15.0)
Lymphocytes Relative: 28.5 % (ref 12.0–46.0)
Lymphs Abs: 2.1 10*3/uL (ref 0.7–4.0)
MCHC: 34.2 g/dL (ref 30.0–36.0)
MCV: 85.6 fl (ref 78.0–100.0)
Monocytes Absolute: 0.4 10*3/uL (ref 0.1–1.0)
Monocytes Relative: 6.1 % (ref 3.0–12.0)
Neutro Abs: 4.6 10*3/uL (ref 1.4–7.7)
Neutrophils Relative %: 62.7 % (ref 43.0–77.0)
Platelets: 300 10*3/uL (ref 150.0–400.0)
RBC: 4.79 Mil/uL (ref 3.87–5.11)
RDW: 16.7 % — ABNORMAL HIGH (ref 11.5–15.5)
WBC: 7.3 10*3/uL (ref 4.0–10.5)

## 2018-11-08 LAB — LIPID PANEL
Cholesterol: 283 mg/dL — ABNORMAL HIGH (ref 0–200)
HDL: 51.3 mg/dL (ref >39.00)
LDL Cholesterol: 195 mg/dL — ABNORMAL HIGH (ref 0–99)
NonHDL: 231.62
Total CHOL/HDL Ratio: 6
Triglycerides: 182 mg/dL — ABNORMAL HIGH (ref 0.0–149.0)
VLDL: 36.4 mg/dL (ref 0.0–40.0)

## 2018-11-08 MED ORDER — NEBIVOLOL HCL 10 MG PO TABS: 10.0000 mg | ORAL_TABLET | Freq: Every day | ORAL | 1 refills | Status: DC

## 2018-11-08 MED ORDER — PANTOPRAZOLE SODIUM 40 MG PO TBEC: 40.0000 mg | DELAYED_RELEASE_TABLET | Freq: Every day | ORAL | 6 refills | Status: DC

## 2018-11-08 NOTE — Telephone Encounter (Signed)
°  Relation to pt: self  Call back number:  [phone]  Pharmacy: [id number] - [place], [place] [phone] (Phone) [phone] (Fax)     Reason for call:  Patient states nebivolol (BYSTOLIC) 10 MG tablet cost $200 after using the discount card medication is $100, patient would like alternate due to medication being expensive

## 2018-11-08 NOTE — Addendum Note (Signed)
Addended by: Marcina Millard on: 11/08/2018 01:11 PM   Modules accepted: Orders

## 2018-11-08 NOTE — Progress Notes (Signed)
Shannon [name] is a 53 y.o. female with the following history as recorded in EpicCare:  Patient Active Problem List   Diagnosis Date Noted  . Piriformis syndrome of both sides 09/02/2018  . Routine general medical examination at a health care facility 12/22/2016  . Sleep apnea 08/12/2016  . Colon cancer screening 01/24/2015  . GERD (gastroesophageal reflux disease) 12/26/2014  . GAD (generalized anxiety disorder) 12/28/2013  . Abnormal vaginal Pap smear 10/25/2013  . Thiamin deficiency 10/30/2012  . Vitamin D deficiency 10/27/2012  . Other abnormal glucose 10/26/2012  . Mixed bipolar I disorder ([place]) 07/12/2012  . Hypokalemia, inadequate intake 09/01/2011  . Insomnia 04/04/2011  . Visit for screening mammogram 02/26/2011  . Hx of laparoscopic gastric banding 09/03/2010  . Tobacco abuse 06/09/2010  . Hyperlipidemia with target LDL less than 130 01/25/2009  . Morbid obesity ([place]) 01/25/2009  . Iron deficiency anemia 01/25/2009  . Essential hypertension 01/25/2009  . Allergic rhinitis 01/25/2009  . [place] DISEASE, LUMBAR 01/25/2009  . Backache 01/25/2009    Current Outpatient Medications  Medication Sig Dispense Refill  . Brexpiprazole (REXULTI) 1 MG TABS Take 1 tablet (1 mg total) by mouth at bedtime. 30 tablet 0  . clonazePAM (KLONOPIN) 0.5 MG tablet Take 1 tablet (0.5 mg total) by mouth 3 (three) times daily as needed for anxiety. 90 tablet 1  . pantoprazole (PROTONIX) 40 MG tablet Take 1 tablet (40 mg total) by mouth daily. 30 tablet 6  . QUEtiapine (SEROQUEL XR) 200 MG 24 hr tablet Take 1 tablet (200 mg total) by mouth daily. 30 tablet 1  . thiamine (CVS B-1) 100 MG tablet Take 1 tablet (100 mg total) daily by mouth. 100 tablet 3  . Vitamin D, Ergocalciferol, (DRISDOL) 1.25 MG (50000 UT) CAPS capsule Take 1 capsule (50,000 Units total) by mouth every 7 (seven) days. 12 capsule 0  . Vitamin D, Ergocalciferol, (DRISDOL) 50000 units CAPS capsule TAKE ONE CAPSULE BY MOUTH EVERY 7  DAYS 12 capsule 1  . zolpidem (AMBIEN) 10 MG tablet Take 1 tablet (10 mg total) by mouth at bedtime. 30 tablet 1  . ezetimibe-simvastatin (VYTORIN) 10-40 MG tablet TAKE 1 TABLET BY MOUTH DAILY (Patient not taking: Reported on 11/08/2018) 90 tablet 3  . Multiple Vitamin (MULTIVITAMIN) tablet Take 1 tablet by mouth daily. Reported on 09/09/2015    . nebivolol (BYSTOLIC) 10 MG tablet Take 1 tablet (10 mg total) by mouth daily. 90 tablet 1   No current facility-administered medications for this visit.     Allergies: Lamictal [lamotrigine]  Past Medical History:  Diagnosis Date  . Allergic rhinitis   . ANEMIA-IRON DEFICIENCY 01/25/2009  . Anxiety   . Bipolar 1 disorder ([place])   . Chronic pain syndrome   . Complete tear of right rotator cuff 07/21/2013  . Contact lens/glasses fitting   . [place] DISEASE, LUMBAR 01/25/2009  . GERD (gastroesophageal reflux disease)   . HYPERLIPIDEMIA 01/25/2009  . HYPERTENSION 01/25/2009  . MANIC DEPRESSIVE ILLNESS 01/25/2009   pt is unsue if this is her specifc dx  . Morbid obesity ([place]) 01/25/2009  . Night sweats   . PEPTIC ULCER DISEASE, HELICOBACTER PYLORI POSITIVE 10/03/2009    Past Surgical History:  Procedure Laterality Date  . ABDOMINAL HYSTERECTOMY    . BLADDER SURGERY     s/p with ?diverticulitis  . HAND TENDON SURGERY  1991   s/p-Right-index and middle  . LAPAROSCOPIC GASTRIC BAND REMOVAL WITH LAPAROSCOPIC GASTRIC SLEEVE RESECTION N/A 08/17/2016   Procedure: LAPAROSCOPIC GASTRIC  BAND REMOVAL WITH LAPAROSCOPIC GASTRIC SLEEVE RESECTION WITH UPPER ENDO;  Surgeon: Alphonsa Overall, MD;  Location: [facility];  Service: General;  Laterality: N/A;  . LAPAROSCOPIC GASTRIC BANDING  07/14/10  . SHOULDER ARTHROSCOPY Right 07/21/2013   Procedure: RIGHT ARTHROSCOPY SHOULDER DEBRIDMENT EXTENTSIVE,ARTHROSCOPIC REMOVE LOOSE FOREIGN BODY, BICEPS TENOLYSIS ;  Surgeon: Johnny Bridge, MD;  Location: [facility];  Service: Orthopedics;  Laterality: Right;  . TUBAL  LIGATION      Family History  Problem Relation Age of Onset  . Hypertension Mother   . Asthma Mother   . Stroke Father   . Cirrhosis Father        ETOH  . Hypertension Father   . Diabetes Father   . Alcohol abuse Father   . Hypertension Sister   . Asthma Sister   . Hypertension Brother   . ADD / ADHD Son   . Hypertension Paternal Aunt   . Diabetes Maternal Grandmother   . ADD / ADHD Other        3 nephews, also emotional issues  . Diabetes Other        Uncle  . Diabetes Other        Aunt  . Suicidality Neg Hx     Social History   Tobacco Use  . Smoking status: Current Every Day Smoker    Packs/day: 0.25    Years: 20.00    Pack years: 5.00    Types: Cigarettes  . Smokeless tobacco: Never Used  . Tobacco comment: States has cut back to 5-7 a day and working on this.   Substance Use Topics  . Alcohol use: No    Alcohol/week: 0.0 standard drinks    Subjective:  Follow-up on hypertension; has not been seen in primary care in over a year; notes she has been off her blood pressure medication for at least the past year; does not remember the last time she took her cholesterol medication; admits that personal stress level has been very high and she has not been taking care of herself like she should; wants to get back on track; Has been having increased headaches and feels like her vision is not clear- "seeing more floaters" recently; Defers updating mammogram; s/p total hysterectomy;  Is planning to see endocrine in follow-up about elevated PTH/ abnormal calcium level; Sees her psychiatrist regularly for management of her bipolar disorder;  Also mentions that she has been having increased sensation of feeling more winded when she climbs the stairs to her apartment x 3-4 months;      Objective:  Vitals:   11/08/18 1043  BP: (!) 150/98  Pulse: 93  Temp: 98.2 F (36.8 C)  TempSrc: Oral  SpO2: 98%  Weight: 276 lb 9.6 oz (125.5 kg)  Height: 5' 4"  (1.626 m)    General:  Well developed, well nourished, in no acute distress  Skin : Warm and dry.  Head: Normocephalic and atraumatic  Eyes: Sclera and conjunctiva clear; pupils round and reactive to light; extraocular movements intact  Ears: External normal; canals clear; tympanic membranes normal  Oropharynx: Pink, supple. No suspicious lesions  Neck: Supple without thyromegaly, adenopathy  Lungs: Respirations unlabored; clear to auscultation bilaterally without wheeze, rales, rhonchi  CVS exam: normal rate, regular rhythm, normal S1, S2, no murmurs, rubs, clicks or gallops, normal rate and regular rhythm.  Abdomen: Soft; nontender; nondistended; normoactive bowel sounds; no masses or hepatosplenomegaly  Musculoskeletal: No deformities; no active joint inflammation  Extremities: No edema, cyanosis, clubbing  Vessels: Symmetric bilaterally  Neurologic: Alert and oriented; speech intact; face symmetrical; moves all extremities well; CNII-XII intact without focal deficit   Assessment:  1. Essential hypertension   2. Hyperlipidemia with target LDL less than 130   3. Thiamine deficiency   4. Dyspnea on exertion   5. High serum parathyroid hormone (PTH)   6. Non-compliance     Plan:  1. Uncontrolled; non-compliant; re-start Bystolic 10 mg daily; re-check 1 month; 2. Check lipid panel; will determine need to re-start cholesterol medication; 3. Check B1 level;  4. Refer to cardiology; 5. Keep follow-up with endocrine; 6. Stressed need to keep her follow-up appointments in order to take appropriate care of her healthcare needs.    No follow-ups on file.  Orders Placed This Encounter  Procedures  . CBC w/Diff    Standing Status:   Future    Number of Occurrences:   1    Standing Expiration Date:   11/08/2019  . Comp Met (CMET)    Standing Status:   Future    Number of Occurrences:   1    Standing Expiration Date:   11/08/2019  . Lipid panel    Standing Status:   Future    Number of Occurrences:   1     Standing Expiration Date:   11/08/2019  . Vitamin B1    Standing Status:   Future    Number of Occurrences:   1    Standing Expiration Date:   11/08/2019  . Ambulatory referral to Cardiology    Referral Priority:   Routine    Referral Type:   Consultation    Referral Reason:   Specialty Services Required    Requested Specialty:   Cardiology    Number of Visits Requested:   1    Requested Prescriptions   Signed Prescriptions Disp Refills  . pantoprazole (PROTONIX) 40 MG tablet 30 tablet 6    Sig: Take 1 tablet (40 mg total) by mouth daily.  . nebivolol (BYSTOLIC) 10 MG tablet 90 tablet 1    Sig: Take 1 tablet (10 mg total) by mouth daily.

## 2018-11-09 ENCOUNTER — Other Ambulatory Visit: Payer: Self-pay | Admitting: Family

## 2018-11-09 MED ORDER — AMLODIPINE BESYLATE 5 MG PO TABS: 5.0000 mg | ORAL_TABLET | Freq: Every day | ORAL | 1 refills | Status: DC

## 2018-11-09 MED ORDER — EZETIMIBE-SIMVASTATIN 10-40 MG PO TABS: 1.0000 | ORAL_TABLET | Freq: Every day | ORAL | 3 refills | Status: DC

## 2018-11-09 MED ORDER — POTASSIUM CHLORIDE CRYS ER 20 MEQ PO TBCR: 20.0000 meq | EXTENDED_RELEASE_TABLET | Freq: Every day | ORAL | 3 refills | Status: DC

## 2018-11-09 NOTE — Telephone Encounter (Signed)
I am going to try her on a medication called Amlodipine 5 mg daily; should be generic and needs to be taken daily;

## 2018-11-09 NOTE — Telephone Encounter (Signed)
Called and left message for patient today about new script being sent in today.

## 2018-11-11 ENCOUNTER — Other Ambulatory Visit: Payer: Self-pay | Admitting: Family

## 2018-11-11 DIAGNOSIS — E519 Thiamine deficiency, unspecified: Secondary | ICD-10-CM

## 2018-11-11 LAB — VITAMIN B1: Vitamin B1 (Thiamine): <6 nmol/L — ABNORMAL LOW (ref 8–30)

## 2018-11-11 MED ORDER — THIAMINE HCL 100 MG PO TABS: 100.0000 mg | ORAL_TABLET | Freq: Every day | ORAL | 3 refills | Status: DC

## 2018-11-17 ENCOUNTER — Ambulatory Visit: Payer: Medicare Other | Admitting: Internal Medicine

## 2018-11-17 ENCOUNTER — Other Ambulatory Visit: Payer: Self-pay | Admitting: Family Medicine

## 2018-11-18 ENCOUNTER — Other Ambulatory Visit: Payer: Self-pay

## 2018-11-22 ENCOUNTER — Ambulatory Visit: Payer: Medicare Other | Admitting: Internal Medicine

## 2018-11-22 NOTE — Progress Notes (Deleted)
Name: Shannon [name]  MRN/ DOB: [id number], 04-30-1965    Age/ Sex: 53 y.o., female    PCP: Marrian Salvage, FNP   Reason for Endocrinology Evaluation: Hypocalcemia     Date of Initial Endocrinology Evaluation: 11/22/2018     HPI: [name] is a 53 y.o. female with a past medical history of Dyslipideia, GAD and OSA. The patient presented for initial endocrinology clinic visit on 11/22/2018 for consultative assistance with her Hypocalcemia    Pt was noted to have hypocalcemia during routine labs intermittently since 2010. Of note she is S/P   HISTORY:  Past Medical History:  Past Medical History:  Diagnosis Date  . Allergic rhinitis   . ANEMIA-IRON DEFICIENCY 01/25/2009  . Anxiety   . Bipolar 1 disorder ([place])   . Chronic pain syndrome   . Complete tear of right rotator cuff 07/21/2013  . Contact lens/glasses fitting   . [place] DISEASE, LUMBAR 01/25/2009  . GERD (gastroesophageal reflux disease)   . HYPERLIPIDEMIA 01/25/2009  . HYPERTENSION 01/25/2009  . MANIC DEPRESSIVE ILLNESS 01/25/2009   pt is unsue if this is her specifc dx  . Morbid obesity ([place]) 01/25/2009  . Night sweats   . PEPTIC ULCER DISEASE, HELICOBACTER PYLORI POSITIVE 10/03/2009    Past Surgical History:  Past Surgical History:  Procedure Laterality Date  . ABDOMINAL HYSTERECTOMY    . BLADDER SURGERY     s/p with ?diverticulitis  . HAND TENDON SURGERY  1991   s/p-Right-index and middle  . LAPAROSCOPIC GASTRIC BAND REMOVAL WITH LAPAROSCOPIC GASTRIC SLEEVE RESECTION N/A 08/17/2016   Procedure: LAPAROSCOPIC GASTRIC BAND REMOVAL WITH LAPAROSCOPIC GASTRIC SLEEVE RESECTION WITH UPPER ENDO;  Surgeon: Alphonsa Overall, MD;  [facility];  Service: General;  Laterality: N/A;  . LAPAROSCOPIC GASTRIC BANDING  07/14/10  . SHOULDER ARTHROSCOPY Right 07/21/2013   Procedure: RIGHT ARTHROSCOPY SHOULDER DEBRIDMENT EXTENTSIVE,ARTHROSCOPIC REMOVE LOOSE FOREIGN BODY, BICEPS TENOLYSIS ;  Surgeon: Johnny Bridge,  MD;  Location: [facility];  Service: Orthopedics;  Laterality: Right;  . TUBAL LIGATION        Social History:  reports that she has been smoking cigarettes. She has a 5.00 pack-year smoking history. She has never used smokeless tobacco. She reports that she does not drink alcohol or use drugs.  Family History: family history includes ADD / ADHD in her son and another family member; Alcohol abuse in her father; Asthma in her mother and sister; Cirrhosis in her father; Diabetes in her father, maternal grandmother, and other family members; Hypertension in her brother, father, mother, paternal aunt, and sister; Stroke in her father.   HOME MEDICATIONS: Allergies as of 11/22/2018      Reactions   Lamictal [lamotrigine] Rash      Medication List       Accurate as of November 22, 2018  7:23 AM. If you have any questions, ask your nurse or doctor.        amLODipine 5 MG tablet Commonly known as: NORVASC Take 1 tablet (5 mg total) by mouth daily.   clonazePAM 0.5 MG tablet Commonly known as: KLONOPIN Take 1 tablet (0.5 mg total) by mouth 3 (three) times daily as needed for anxiety.   ezetimibe-simvastatin 10-40 MG tablet Commonly known as: VYTORIN Take 1 tablet by mouth daily.   multivitamin tablet Take 1 tablet by mouth daily. Reported on 09/09/2015   pantoprazole 40 MG tablet Commonly known as: PROTONIX Take 1 tablet (40 mg total) by mouth daily.  potassium chloride SA 20 MEQ tablet Commonly known as: K-DUR Take 1 tablet (20 mEq total) by mouth daily.   QUEtiapine 200 MG 24 hr tablet Commonly known as: SEROQUEL XR Take 1 tablet (200 mg total) by mouth daily.   Rexulti 1 MG Tabs tablet Generic drug: brexpiprazole Take 1 tablet (1 mg total) by mouth at bedtime.   thiamine 100 MG tablet Commonly known as: CVS B-1 Take 1 tablet (100 mg total) by mouth daily.   Vitamin D (Ergocalciferol) 1.25 MG (50000 UT) Caps capsule Commonly known as: DRISDOL TAKE  ONE CAPSULE BY MOUTH EVERY 7 DAYS   Vitamin D (Ergocalciferol) 1.25 MG (50000 UT) Caps capsule Commonly known as: DRISDOL TAKE 1 CAPSULE (50,000 UNITS TOTAL) BY MOUTH EVERY 7 (SEVEN) DAYS.   zolpidem 10 MG tablet Commonly known as: Ambien Take 1 tablet (10 mg total) by mouth at bedtime.         REVIEW OF SYSTEMS: A comprehensive ROS was conducted with the patient and is negative except as per HPI and below:  ROS     OBJECTIVE:  VS: There were no vitals taken for this visit.   Wt Readings from Last 3 Encounters:  11/08/18 276 lb 9.6 oz (125.5 kg)  09/01/18 283 lb (128.4 kg)  12/21/16 261 lb 8 oz (118.6 kg)     EXAM: General: Pt appears well and is in NAD  Hydration: Well-hydrated with moist mucous membranes and good skin turgor  Eyes: External eye exam normal without stare, lid lag or exophthalmos.  EOM intact.  PERRL.  Ears, Nose, Throat: Hearing: Grossly intact bilaterally Dental: Good dentition  Throat: Clear without mass, erythema or exudate  Neck: General: Supple without adenopathy. Thyroid: Thyroid size normal.  No goiter or nodules appreciated. No thyroid bruit.  Lungs: Clear with good BS bilat with no rales, rhonchi, or wheezes  Heart: Auscultation: RRR.  Abdomen: Normoactive bowel sounds, soft, nontender, without masses or organomegaly palpable  Extremities: Gait and station: Normal gait  Digits and nails: No clubbing, cyanosis, petechiae, or nodes Head and neck: Normal alignment and mobility BL UE: Normal ROM and strength. BL LE: No pretibial edema normal ROM and strength.  Skin: Hair: Texture and amount normal with gender appropriate distribution Skin Inspection: No rashes, acanthosis nigricans/skin tags. No lipohypertrophy Skin Palpation: Skin temperature, texture, and thickness normal to palpation  Neuro: Cranial nerves: II - XII grossly intact  Cerebellar: Normal coordination and movement; no tremor Motor: Normal strength throughout DTRs: 2+ and  symmetric in UE without delay in relaxation phase  Mental Status: Judgment, insight: Intact Orientation: Oriented to time, place, and person Memory: Intact for recent and remote events Mood and affect: No depression, anxiety, or agitation     DATA REVIEWED: ***    ASSESSMENT/PLAN/RECOMMENDATIONS:   1. ***    Medications :  Signed electronically by: Mack Guise, MD  [facility]  [facility] [address] [place], [place] [address] Phone: [phone] FAX: [phone]   CC: Marrian Salvage, [place] [address] Phone: [phone] Fax: [phone]   Return to Endocrinology clinic as below: Future Appointments  Date Time Provider [place]  11/22/2018 10:50 AM Wilkes Potvin, Melanie Crazier, MD [facility] None  12/06/2018 11:00 AM Marrian Salvage, FNP [facility]  12/19/2018 10:20 AM Arfeen, Arlyce Harman, MD [facility] None

## 2018-11-30 ENCOUNTER — Other Ambulatory Visit (HOSPITAL_COMMUNITY): Payer: Self-pay

## 2018-11-30 DIAGNOSIS — F411 Generalized anxiety disorder: Secondary | ICD-10-CM

## 2018-11-30 DIAGNOSIS — F4310 Post-traumatic stress disorder, unspecified: Secondary | ICD-10-CM

## 2018-11-30 DIAGNOSIS — F319 Bipolar disorder, unspecified: Secondary | ICD-10-CM

## 2018-11-30 MED ORDER — BREXPIPRAZOLE 1 MG PO TABS: 1.0000 mg | ORAL_TABLET | Freq: Every day | ORAL | 0 refills | Status: DC

## 2018-12-02 ENCOUNTER — Other Ambulatory Visit: Payer: Self-pay | Admitting: Family

## 2018-12-06 ENCOUNTER — Other Ambulatory Visit: Payer: Self-pay

## 2018-12-06 ENCOUNTER — Encounter: Payer: Self-pay | Admitting: Family

## 2018-12-06 ENCOUNTER — Ambulatory Visit (INDEPENDENT_AMBULATORY_CARE_PROVIDER_SITE_OTHER): Payer: Medicare Other | Admitting: Family

## 2018-12-06 VITALS — BP 154/96 | HR 102 | Temp 98.3°F | Ht 64.0 in | Wt 269.8 lb

## 2018-12-06 DIAGNOSIS — E785 Hyperlipidemia, unspecified: Secondary | ICD-10-CM | POA: Diagnosis not present

## 2018-12-06 DIAGNOSIS — E519 Thiamine deficiency, unspecified: Secondary | ICD-10-CM

## 2018-12-06 DIAGNOSIS — I10 Essential (primary) hypertension: Secondary | ICD-10-CM

## 2018-12-06 DIAGNOSIS — E876 Hypokalemia: Secondary | ICD-10-CM | POA: Diagnosis not present

## 2018-12-06 DIAGNOSIS — Z9119 Patient's noncompliance with other medical treatment and regimen: Secondary | ICD-10-CM

## 2018-12-06 DIAGNOSIS — Z91199 Patient's noncompliance with other medical treatment and regimen due to unspecified reason: Secondary | ICD-10-CM

## 2018-12-06 MED ORDER — AMLODIPINE BESYLATE 5 MG PO TABS: 5.0000 mg | ORAL_TABLET | Freq: Every day | ORAL | 1 refills | Status: DC

## 2018-12-06 NOTE — Progress Notes (Signed)
Shannon [name] is a 53 y.o. female with the following history as recorded in EpicCare:  Patient Active Problem List   Diagnosis Date Noted  . Piriformis syndrome of both sides 09/02/2018  . Routine general medical examination at a health care facility 12/22/2016  . Sleep apnea 08/12/2016  . Colon cancer screening 01/24/2015  . GERD (gastroesophageal reflux disease) 12/26/2014  . GAD (generalized anxiety disorder) 12/28/2013  . Abnormal vaginal Pap smear 10/25/2013  . Thiamin deficiency 10/30/2012  . Vitamin D deficiency 10/27/2012  . Other abnormal glucose 10/26/2012  . Mixed bipolar I disorder ([place]) 07/12/2012  . Hypokalemia, inadequate intake 09/01/2011  . Insomnia 04/04/2011  . Visit for screening mammogram 02/26/2011  . Hx of laparoscopic gastric banding 09/03/2010  . Tobacco abuse 06/09/2010  . Hyperlipidemia with target LDL less than 130 01/25/2009  . Morbid obesity ([place]) 01/25/2009  . Iron deficiency anemia 01/25/2009  . Essential hypertension 01/25/2009  . Allergic rhinitis 01/25/2009  . [place] DISEASE, LUMBAR 01/25/2009  . Backache 01/25/2009    Current Outpatient Medications  Medication Sig Dispense Refill  . brexpiprazole (REXULTI) 1 MG TABS tablet Take 1 tablet (1 mg total) by mouth at bedtime. 30 tablet 0  . clonazePAM (KLONOPIN) 0.5 MG tablet Take 1 tablet (0.5 mg total) by mouth 3 (three) times daily as needed for anxiety. 90 tablet 1  . ezetimibe-simvastatin (VYTORIN) 10-40 MG tablet Take 1 tablet by mouth daily. 30 tablet 3  . Multiple Vitamin (MULTIVITAMIN) tablet Take 1 tablet by mouth daily. Reported on 09/09/2015    . pantoprazole (PROTONIX) 40 MG tablet Take 1 tablet (40 mg total) by mouth daily. 30 tablet 6  . potassium chloride SA (K-DUR) 20 MEQ tablet Take 1 tablet (20 mEq total) by mouth daily. 30 tablet 3  . QUEtiapine (SEROQUEL XR) 200 MG 24 hr tablet Take 1 tablet (200 mg total) by mouth daily. 30 tablet 1  . thiamine (CVS B-1) 100 MG tablet Take 1  tablet (100 mg total) by mouth daily. 30 tablet 3  . Vitamin D, Ergocalciferol, (DRISDOL) 1.25 MG (50000 UT) CAPS capsule TAKE 1 CAPSULE (50,000 UNITS TOTAL) BY MOUTH EVERY 7 (SEVEN) DAYS. 12 capsule 0  . Vitamin D, Ergocalciferol, (DRISDOL) 50000 units CAPS capsule TAKE ONE CAPSULE BY MOUTH EVERY 7 DAYS 12 capsule 1  . amLODipine (NORVASC) 5 MG tablet Take 1 tablet (5 mg total) by mouth daily. 30 tablet 1  . zolpidem (AMBIEN) 10 MG tablet Take 1 tablet (10 mg total) by mouth at bedtime. 30 tablet 1   No current facility-administered medications for this visit.     Allergies: Lamictal [lamotrigine]  Past Medical History:  Diagnosis Date  . Allergic rhinitis   . ANEMIA-IRON DEFICIENCY 01/25/2009  . Anxiety   . Bipolar 1 disorder ([place])   . Chronic pain syndrome   . Complete tear of right rotator cuff 07/21/2013  . Contact lens/glasses fitting   . [place] DISEASE, LUMBAR 01/25/2009  . GERD (gastroesophageal reflux disease)   . HYPERLIPIDEMIA 01/25/2009  . HYPERTENSION 01/25/2009  . MANIC DEPRESSIVE ILLNESS 01/25/2009   pt is unsue if this is her specifc dx  . Morbid obesity ([place]) 01/25/2009  . Night sweats   . PEPTIC ULCER DISEASE, HELICOBACTER PYLORI POSITIVE 10/03/2009    Past Surgical History:  Procedure Laterality Date  . ABDOMINAL HYSTERECTOMY    . BLADDER SURGERY     s/p with ?diverticulitis  . HAND TENDON SURGERY  1991   s/p-Right-index and middle  .  LAPAROSCOPIC GASTRIC BAND REMOVAL WITH LAPAROSCOPIC GASTRIC SLEEVE RESECTION N/A 08/17/2016   Procedure: LAPAROSCOPIC GASTRIC BAND REMOVAL WITH LAPAROSCOPIC GASTRIC SLEEVE RESECTION WITH UPPER ENDO;  Surgeon: Alphonsa Overall, MD;  Location: [facility];  Service: General;  Laterality: N/A;  . LAPAROSCOPIC GASTRIC BANDING  07/14/10  . SHOULDER ARTHROSCOPY Right 07/21/2013   Procedure: RIGHT ARTHROSCOPY SHOULDER DEBRIDMENT EXTENTSIVE,ARTHROSCOPIC REMOVE LOOSE FOREIGN BODY, BICEPS TENOLYSIS ;  Surgeon: Johnny Bridge, MD;  Location: [facility];  Service: Orthopedics;  Laterality: Right;  . TUBAL LIGATION      Family History  Problem Relation Age of Onset  . Hypertension Mother   . Asthma Mother   . Stroke Father   . Cirrhosis Father        ETOH  . Hypertension Father   . Diabetes Father   . Alcohol abuse Father   . Hypertension Sister   . Asthma Sister   . Hypertension Brother   . ADD / ADHD Son   . Hypertension Paternal Aunt   . Diabetes Maternal Grandmother   . ADD / ADHD Other        3 nephews, also emotional issues  . Diabetes Other        Uncle  . Diabetes Other        Aunt  . Suicidality Neg Hx     Social History   Tobacco Use  . Smoking status: Current Every Day Smoker    Packs/day: 0.25    Years: 20.00    Pack years: 5.00    Types: Cigarettes  . Smokeless tobacco: Never Used  . Tobacco comment: States has cut back to 5-7 a day and working on this.   Substance Use Topics  . Alcohol use: No    Alcohol/week: 0.0 standard drinks    Subjective:  1 month follow up on hypertension; did not start Amlodipine due to cost concerns; did not let our office know that she could not afford the medication;  States that she has no prescriptive drug coverage but is able to afford all her other prescriptions okay; states she is taking potassium daily but does not want to get her labs done today.   Objective:  Vitals:   12/06/18 1059  BP: (!) 154/96  Pulse: (!) 102  Temp: 98.3 F (36.8 C)  TempSrc: Oral  SpO2: 94%  Weight: 269 lb 12.8 oz (122.4 kg)  Height: 5\' 4"  (1.626 m)    General: Well developed, well nourished, in no acute distress  Skin : Warm and dry.  Head: Normocephalic and atraumatic  Lungs: Respirations unlabored; clear to auscultation bilaterally without wheeze, rales, rhonchi  CVS exam: normal rate and regular rhythm.  Neurologic: Alert and oriented; speech intact; face symmetrical; moves all extremities well; CNII-XII intact without focal deficit   Assessment:  1. Essential  hypertension   2. Thiamine deficiency   3. Hypokalemia, inadequate intake   4. Hyperlipidemia with target LDL less than 130   5. Non-compliance     Plan:  1. Verified that Amlodipine is $9/ month at [facility]; she agrees to start; return in 1 month; she will call back if she cannot get the medication at this price. Patient defers updating any labs today for her potassium, B1 or lipid panel; agrees to re-check in 4 weeks.  Return in about 4 weeks (around 01/03/2019).  No orders of the defined types were placed in this encounter.   Requested Prescriptions   Signed Prescriptions Disp Refills  . amLODipine (NORVASC)  5 MG tablet 30 tablet 1    Sig: Take 1 tablet (5 mg total) by mouth daily.

## 2018-12-16 ENCOUNTER — Encounter (HOSPITAL_COMMUNITY): Payer: Self-pay

## 2018-12-19 ENCOUNTER — Other Ambulatory Visit: Payer: Self-pay

## 2018-12-19 ENCOUNTER — Encounter (HOSPITAL_COMMUNITY): Payer: Self-pay | Admitting: Psychiatry

## 2018-12-19 ENCOUNTER — Ambulatory Visit (INDEPENDENT_AMBULATORY_CARE_PROVIDER_SITE_OTHER): Payer: Medicare Other | Admitting: Psychiatry

## 2018-12-19 DIAGNOSIS — F319 Bipolar disorder, unspecified: Secondary | ICD-10-CM | POA: Diagnosis not present

## 2018-12-19 DIAGNOSIS — F411 Generalized anxiety disorder: Secondary | ICD-10-CM | POA: Diagnosis not present

## 2018-12-19 DIAGNOSIS — F4310 Post-traumatic stress disorder, unspecified: Secondary | ICD-10-CM | POA: Diagnosis not present

## 2018-12-19 MED ORDER — QUETIAPINE FUMARATE ER 150 MG PO TB24: 150.0000 mg | ORAL_TABLET | Freq: Every day | ORAL | 1 refills | Status: DC

## 2018-12-19 MED ORDER — CLONAZEPAM 0.5 MG PO TABS: 0.5000 mg | ORAL_TABLET | Freq: Three times a day (TID) | ORAL | 1 refills | Status: DC | PRN

## 2018-12-19 MED ORDER — ZOLPIDEM TARTRATE 10 MG PO TABS: 10.0000 mg | ORAL_TABLET | Freq: Every day | ORAL | 1 refills | Status: DC
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
## 2018-12-23 ENCOUNTER — Telehealth (HOSPITAL_COMMUNITY): Payer: Self-pay

## 2018-12-23 NOTE — Telephone Encounter (Signed)
Patient called requesting a 90 day refill on her Rexulti 1mg  for insurance purposes to be sent to [facility] in [place], [place]. Patient has scheduled appointment on 03/03/19. Thank you.

## 2018-12-26 ENCOUNTER — Other Ambulatory Visit (HOSPITAL_COMMUNITY): Payer: Self-pay

## 2018-12-26 DIAGNOSIS — F411 Generalized anxiety disorder: Secondary | ICD-10-CM

## 2018-12-26 DIAGNOSIS — F319 Bipolar disorder, unspecified: Secondary | ICD-10-CM

## 2018-12-26 DIAGNOSIS — F4310 Post-traumatic stress disorder, unspecified: Secondary | ICD-10-CM

## 2018-12-26 MED ORDER — BREXPIPRAZOLE 1 MG PO TABS: 1.0000 mg | ORAL_TABLET | Freq: Every day | ORAL | 0 refills | Status: DC

## 2018-12-26 NOTE — Telephone Encounter (Signed)
Done

## 2018-12-26 NOTE — Telephone Encounter (Signed)
Yes.  Please call Rexulti 1 mg for 90 days to her pharmacy at [facility] in [place].

## 2019-01-01 ENCOUNTER — Other Ambulatory Visit: Payer: Self-pay | Admitting: Family

## 2019-01-24 ENCOUNTER — Other Ambulatory Visit: Payer: Self-pay | Admitting: Family

## 2019-01-27 ENCOUNTER — Ambulatory Visit: Payer: Medicare Other | Admitting: Cardiology

## 2019-02-27 ENCOUNTER — Telehealth (HOSPITAL_COMMUNITY): Payer: Self-pay | Admitting: *Deleted

## 2019-02-27 NOTE — Telephone Encounter (Signed)
Writer left message regarding pt reapplying for [employer] Patient assistance regarding the [place]. Number and web address given. Instructed to call with any questions or concerns.

## 2019-03-03 ENCOUNTER — Ambulatory Visit (INDEPENDENT_AMBULATORY_CARE_PROVIDER_SITE_OTHER): Payer: Medicare Other | Admitting: Psychiatry

## 2019-03-03 ENCOUNTER — Other Ambulatory Visit: Payer: Self-pay

## 2019-03-03 DIAGNOSIS — F319 Bipolar disorder, unspecified: Secondary | ICD-10-CM

## 2019-03-03 DIAGNOSIS — F411 Generalized anxiety disorder: Secondary | ICD-10-CM | POA: Diagnosis not present

## 2019-03-03 DIAGNOSIS — F4310 Post-traumatic stress disorder, unspecified: Secondary | ICD-10-CM | POA: Diagnosis not present

## 2019-03-03 MED ORDER — ZOLPIDEM TARTRATE 10 MG PO TABS: 10.0000 mg | ORAL_TABLET | Freq: Every day | ORAL | 2 refills | Status: DC

## 2019-03-03 MED ORDER — CLONAZEPAM 0.5 MG PO TABS: 0.5000 mg | ORAL_TABLET | Freq: Three times a day (TID) | ORAL | 2 refills | Status: DC | PRN

## 2019-03-03 MED ORDER — QUETIAPINE FUMARATE ER 50 MG PO TB24: 50.0000 mg | ORAL_TABLET | Freq: Every day | ORAL | 0 refills | Status: DC
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
## 2019-03-06 ENCOUNTER — Encounter (HOSPITAL_COMMUNITY): Payer: Self-pay

## 2019-03-09 ENCOUNTER — Ambulatory Visit: Payer: Medicare Other | Admitting: Cardiology

## 2019-04-07 ENCOUNTER — Encounter: Payer: Self-pay | Admitting: General Practice

## 2019-04-11 ENCOUNTER — Ambulatory Visit: Payer: Medicare Other | Admitting: Cardiology

## 2019-04-12 DIAGNOSIS — B349 Viral infection, unspecified: Secondary | ICD-10-CM | POA: Diagnosis not present

## 2019-04-12 DIAGNOSIS — R05 Cough: Secondary | ICD-10-CM | POA: Diagnosis not present

## 2019-04-12 DIAGNOSIS — R110 Nausea: Secondary | ICD-10-CM | POA: Diagnosis not present

## 2019-04-21 ENCOUNTER — Other Ambulatory Visit: Payer: Self-pay | Admitting: Family

## 2019-04-30 ENCOUNTER — Other Ambulatory Visit: Payer: Self-pay | Admitting: Family

## 2019-05-23 ENCOUNTER — Telehealth (HOSPITAL_COMMUNITY): Payer: Self-pay | Admitting: *Deleted

## 2019-05-23 ENCOUNTER — Other Ambulatory Visit (HOSPITAL_COMMUNITY): Payer: Self-pay | Admitting: *Deleted

## 2019-05-23 DIAGNOSIS — F411 Generalized anxiety disorder: Secondary | ICD-10-CM

## 2019-05-23 DIAGNOSIS — F319 Bipolar disorder, unspecified: Secondary | ICD-10-CM

## 2019-05-23 DIAGNOSIS — F4310 Post-traumatic stress disorder, unspecified: Secondary | ICD-10-CM

## 2019-05-23 MED ORDER — BREXPIPRAZOLE 1 MG PO TABS: 1.0000 mg | ORAL_TABLET | Freq: Every day | ORAL | 0 refills | Status: DC

## 2019-05-23 NOTE — Progress Notes (Unsigned)
Pt called asking for refill, and samples of Rexulti I mg.

## 2019-05-23 NOTE — Telephone Encounter (Signed)
Thanks for update

## 2019-05-23 NOTE — Telephone Encounter (Signed)
Writer returned pt call regarding refilling the Rexulti 1mg  and obtaining amples. Writer left VM informing pt samples would be ready whenever she is able to come by the clinic. Writer also refilled Rx. Pt asked to call with any questions or concerns.

## 2019-05-26 ENCOUNTER — Other Ambulatory Visit (HOSPITAL_COMMUNITY): Payer: Self-pay | Admitting: *Deleted

## 2019-05-26 DIAGNOSIS — F411 Generalized anxiety disorder: Secondary | ICD-10-CM

## 2019-05-26 DIAGNOSIS — F4310 Post-traumatic stress disorder, unspecified: Secondary | ICD-10-CM

## 2019-05-26 DIAGNOSIS — F319 Bipolar disorder, unspecified: Secondary | ICD-10-CM

## 2019-05-26 MED ORDER — BREXPIPRAZOLE 1 MG PO TABS: 1.0000 mg | ORAL_TABLET | Freq: Every day | ORAL | 0 refills | Status: DC

## 2019-05-30 ENCOUNTER — Other Ambulatory Visit (HOSPITAL_COMMUNITY): Payer: Self-pay | Admitting: Psychiatry

## 2019-05-30 DIAGNOSIS — F319 Bipolar disorder, unspecified: Secondary | ICD-10-CM

## 2019-06-01 ENCOUNTER — Ambulatory Visit (INDEPENDENT_AMBULATORY_CARE_PROVIDER_SITE_OTHER): Payer: Medicare Other | Admitting: Psychiatry

## 2019-06-01 ENCOUNTER — Encounter (HOSPITAL_COMMUNITY): Payer: Self-pay | Admitting: Psychiatry

## 2019-06-01 ENCOUNTER — Other Ambulatory Visit: Payer: Self-pay

## 2019-06-01 DIAGNOSIS — F319 Bipolar disorder, unspecified: Secondary | ICD-10-CM

## 2019-06-01 DIAGNOSIS — F4310 Post-traumatic stress disorder, unspecified: Secondary | ICD-10-CM | POA: Diagnosis not present

## 2019-06-01 DIAGNOSIS — F419 Anxiety disorder, unspecified: Secondary | ICD-10-CM | POA: Diagnosis not present

## 2019-06-01 DIAGNOSIS — F5101 Primary insomnia: Secondary | ICD-10-CM | POA: Diagnosis not present

## 2019-06-01 MED ORDER — ZOLPIDEM TARTRATE 10 MG PO TABS: 10.0000 mg | ORAL_TABLET | Freq: Every day | ORAL | 2 refills | Status: DC

## 2019-06-01 MED ORDER — QUETIAPINE FUMARATE ER 50 MG PO TB24: 100.0000 mg | ORAL_TABLET | Freq: Every day | ORAL | 0 refills | Status: DC

## 2019-06-01 MED ORDER — CLONAZEPAM 0.5 MG PO TABS: 0.5000 mg | ORAL_TABLET | Freq: Three times a day (TID) | ORAL | 2 refills | Status: DC | PRN
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
## 2019-06-06 ENCOUNTER — Other Ambulatory Visit: Payer: Self-pay | Admitting: Family

## 2019-06-12 ENCOUNTER — Other Ambulatory Visit: Payer: Self-pay | Admitting: Family Medicine

## 2019-07-04 ENCOUNTER — Telehealth: Payer: Self-pay | Admitting: Family

## 2019-07-04 NOTE — Telephone Encounter (Signed)
Yes, she has to be seen; she is overdue for follow-up on her blood pressure.

## 2019-07-04 NOTE — Telephone Encounter (Signed)
New Message:   Pt is calling and states her handicap placard is getting ready to expire. She would like to know does she need an appt to renew this. Please advise.

## 2019-07-05 ENCOUNTER — Ambulatory Visit: Payer: Medicare Other | Admitting: Family

## 2019-07-10 ENCOUNTER — Telehealth (INDEPENDENT_AMBULATORY_CARE_PROVIDER_SITE_OTHER): Payer: Medicare Other | Admitting: Psychiatry

## 2019-07-10 ENCOUNTER — Other Ambulatory Visit: Payer: Self-pay

## 2019-07-10 ENCOUNTER — Encounter (HOSPITAL_COMMUNITY): Payer: Self-pay | Admitting: Psychiatry

## 2019-07-10 DIAGNOSIS — F5101 Primary insomnia: Secondary | ICD-10-CM

## 2019-07-10 DIAGNOSIS — F4310 Post-traumatic stress disorder, unspecified: Secondary | ICD-10-CM | POA: Diagnosis not present

## 2019-07-10 DIAGNOSIS — F419 Anxiety disorder, unspecified: Secondary | ICD-10-CM

## 2019-07-10 DIAGNOSIS — F319 Bipolar disorder, unspecified: Secondary | ICD-10-CM

## 2019-07-10 MED ORDER — DULOXETINE HCL 20 MG PO CPEP: 20.0000 mg | ORAL_CAPSULE | Freq: Every day | ORAL | 0 refills | Status: DC

## 2019-07-10 MED ORDER — CLONAZEPAM 0.5 MG PO TABS: 0.5000 mg | ORAL_TABLET | Freq: Three times a day (TID) | ORAL | 0 refills | Status: DC | PRN
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
## 2019-07-11 ENCOUNTER — Encounter: Payer: Self-pay | Admitting: Family

## 2019-07-11 ENCOUNTER — Other Ambulatory Visit: Payer: Self-pay

## 2019-07-11 ENCOUNTER — Ambulatory Visit (INDEPENDENT_AMBULATORY_CARE_PROVIDER_SITE_OTHER): Payer: Medicare Other | Admitting: Family

## 2019-07-11 VITALS — BP 150/90 | HR 90 | Temp 98.2°F | Ht 64.0 in | Wt 248.2 lb

## 2019-07-11 DIAGNOSIS — M2550 Pain in unspecified joint: Secondary | ICD-10-CM

## 2019-07-11 DIAGNOSIS — E785 Hyperlipidemia, unspecified: Secondary | ICD-10-CM

## 2019-07-11 DIAGNOSIS — Z9119 Patient's noncompliance with other medical treatment and regimen: Secondary | ICD-10-CM | POA: Diagnosis not present

## 2019-07-11 DIAGNOSIS — I10 Essential (primary) hypertension: Secondary | ICD-10-CM

## 2019-07-11 DIAGNOSIS — Z91199 Patient's noncompliance with other medical treatment and regimen due to unspecified reason: Secondary | ICD-10-CM

## 2019-07-11 MED ORDER — AMLODIPINE BESYLATE 5 MG PO TABS: 5.0000 mg | ORAL_TABLET | Freq: Every day | ORAL | 1 refills | Status: DC

## 2019-07-11 NOTE — Progress Notes (Signed)
Shannon [name] is a 54 y.o. female with the following history as recorded in EpicCare:  Patient Active Problem List   Diagnosis Date Noted  . Piriformis syndrome of both sides 09/02/2018  . Routine general medical examination at a health care facility 12/22/2016  . Sleep apnea 08/12/2016  . Colon cancer screening 01/24/2015  . GERD (gastroesophageal reflux disease) 12/26/2014  . GAD (generalized anxiety disorder) 12/28/2013  . Abnormal vaginal Pap smear 10/25/2013  . Thiamin deficiency 10/30/2012  . Vitamin D deficiency 10/27/2012  . Other abnormal glucose 10/26/2012  . Mixed bipolar I disorder ([place]) 07/12/2012  . Hypokalemia, inadequate intake 09/01/2011  . Insomnia 04/04/2011  . Visit for screening mammogram 02/26/2011  . Hx of laparoscopic gastric banding 09/03/2010  . Tobacco abuse 06/09/2010  . Hyperlipidemia with target LDL less than 130 01/25/2009  . Morbid obesity ([place]) 01/25/2009  . Iron deficiency anemia 01/25/2009  . Essential hypertension 01/25/2009  . Allergic rhinitis 01/25/2009  . [place] DISEASE, LUMBAR 01/25/2009  . Backache 01/25/2009  . Leiomyoma of uterus, unspecified 08/13/2008    Current Outpatient Medications  Medication Sig Dispense Refill  . amLODipine (NORVASC) 5 MG tablet Take 1 tablet (5 mg total) by mouth daily. 30 tablet 1  . clonazePAM (KLONOPIN) 0.5 MG tablet Take 1 tablet (0.5 mg total) by mouth 3 (three) times daily as needed for anxiety. 90 tablet 0  . DULoxetine (CYMBALTA) 20 MG capsule Take 1 capsule (20 mg total) by mouth daily. 30 capsule 0  . ezetimibe-simvastatin (VYTORIN) 10-40 MG tablet Take 1 tablet by mouth daily. 30 tablet 3  . Multiple Vitamin (MULTIVITAMIN) tablet Take 1 tablet by mouth daily. Reported on 09/09/2015    . pantoprazole (PROTONIX) 40 MG tablet TAKE 1 TABLET BY MOUTH EVERY DAY 90 tablet 0  . potassium chloride SA (K-DUR) 20 MEQ tablet Take 1 tablet (20 mEq total) by mouth daily. 30 tablet 3  . QUEtiapine (SEROQUEL XR)  50 MG TB24 24 hr tablet Take 2 tablets (100 mg total) by mouth at bedtime. 180 tablet 0  . thiamine ([facility] B-1) 100 MG tablet Take 1 tablet (100 mg total) by mouth daily. 30 tablet 3  . Vitamin D, Ergocalciferol, (DRISDOL) 1.25 MG (50000 UNIT) CAPS capsule TAKE 1 CAPSULE (50,000 UNITS TOTAL) BY MOUTH EVERY 7 (SEVEN) DAYS. 12 capsule 0  . Vitamin D, Ergocalciferol, (DRISDOL) 50000 units CAPS capsule TAKE ONE CAPSULE BY MOUTH EVERY 7 DAYS 12 capsule 1  . zolpidem (AMBIEN) 10 MG tablet Take 1 tablet (10 mg total) by mouth at bedtime. 30 tablet 2   No current facility-administered medications for this visit.    Allergies: Lamictal [lamotrigine]  Past Medical History:  Diagnosis Date  . Allergic rhinitis   . Allergic rhinitis 01/25/2009       . ANEMIA-IRON DEFICIENCY 01/25/2009  . Anxiety   . Backache 01/25/2009   Qualifier: Diagnosis of  By: Jenny Reichmann MD, Hunt Oris   . Bipolar 1 disorder ([place])   . Chronic pain syndrome   . Complete rotator cuff tear 07/21/2013  . Complete tear of right rotator cuff 07/21/2013  . Contact lens/glasses fitting   . [place] DISEASE, LUMBAR 01/25/2009  . Essential hypertension [id number]   On Bystolic   . GAD (generalized anxiety disorder) 12/28/2013  . GERD (gastroesophageal reflux disease)   . 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  . Headache, acute 12/20/2013  . Hx of laparoscopic gastric banding 09/03/2010   Surgery date: 07/17/10   . HYPERLIPIDEMIA 01/25/2009  . Hyperlipidemia with  target LDL less than 130 01/25/2009  . HYPERTENSION 01/25/2009  . Hypokalemia, inadequate intake 09/01/2011  . Insomnia 04/04/2011  . INSOMNIA-SLEEP DISORDER-UNSPEC 04/30/2009   Qualifier: Diagnosis of  By: Jenny Reichmann MD, Hunt Oris   . Iron deficiency anemia 01/25/2009       . Labyrinthitis 02/19/2014  . Leiomyoma of uterus, unspecified 08/13/2008   Overview:  Leiomyoma Of The Uterus  10/1 IMO update  . Localized edema 06/12/2015  . MANIC DEPRESSIVE ILLNESS 01/25/2009   pt is unsue if this is her specifc dx  .  Mixed bipolar I disorder ([place]) 07/12/2012  . Morbid obesity ([place]) 01/25/2009  . Night sweats   . Other abnormal glucose 10/26/2012  . Palpitations 10/25/2013  . PEPTIC ULCER DISEASE, HELICOBACTER PYLORI POSITIVE 10/03/2009  . Peripheral edema 06/25/2015  . Pernicious anemia 03/28/2012  . Piriformis syndrome of both sides 09/02/2018  . PUD (peptic ulcer disease) 10/03/2009       . Right shoulder pain 05/03/2013   Dg Shoulder Right  05/03/2013   CLINICAL DATA Pain.  EXAM RIGHT SHOULDER - 2+ VIEW  COMPARISON None.  FINDINGS Acromioclavicular and glenohumeral degenerative change present. Questionable calcific density noted in the region of the supraspinatus space, possibly representing calcific supraspinatus tendinitis. This could represent a sclerotic density in the acromion. MRI of the right shoulder suggest  . Sleep apnea 08/12/2016  . SMOKER 01/25/2009   Qualifier: Diagnosis of  By: Jenny Reichmann MD, Hunt Oris   . Subacromial bursitis 05/17/2013  . SUBSTANCE ABUSE 11/06/2009       . Thiamin deficiency 10/30/2012  . Tobacco abuse 06/09/2010  . Visual disturbance 05/03/2013  . Vitamin D deficiency 10/27/2012    Past Surgical History:  Procedure Laterality Date  . ABDOMINAL HYSTERECTOMY    . BLADDER SURGERY     s/p with ?diverticulitis  . HAND TENDON SURGERY  1991   s/p-Right-index and middle  . LAPAROSCOPIC GASTRIC BAND REMOVAL WITH LAPAROSCOPIC GASTRIC SLEEVE RESECTION N/A 08/17/2016   Procedure: LAPAROSCOPIC GASTRIC BAND REMOVAL WITH LAPAROSCOPIC GASTRIC SLEEVE RESECTION WITH UPPER ENDO;  Surgeon: Alphonsa Overall, MD;  Location: [facility];  Service: General;  Laterality: N/A;  . LAPAROSCOPIC GASTRIC BANDING  07/14/10  . SHOULDER ARTHROSCOPY Right 07/21/2013   Procedure: RIGHT ARTHROSCOPY SHOULDER DEBRIDMENT EXTENTSIVE,ARTHROSCOPIC REMOVE LOOSE FOREIGN BODY, BICEPS TENOLYSIS ;  Surgeon: Johnny Bridge, MD;  Location: [facility];  Service: Orthopedics;  Laterality: Right;  . TUBAL LIGATION      Family  History  Problem Relation Age of Onset  . Hypertension Mother   . Asthma Mother   . Stroke Father   . Cirrhosis Father        ETOH  . Hypertension Father   . Diabetes Father   . Alcohol abuse Father   . Hypertension Sister   . Asthma Sister   . Hypertension Brother   . ADD / ADHD Son   . Hypertension Paternal Aunt   . Diabetes Maternal Grandmother   . ADD / ADHD Other        3 nephews, also emotional issues  . Diabetes Other        Uncle  . Diabetes Other        Aunt  . Suicidality Neg Hx     Social History   Tobacco Use  . Smoking status: Current Every Day Smoker    Packs/day: 0.25    Years: 20.00    Pack years: 5.00    Types: Cigarettes  . Smokeless tobacco: Never  Used  . Tobacco comment: States has cut back to 5-7 a day and working on this.   Substance Use Topics  . Alcohol use: No    Alcohol/week: 0.0 standard drinks    Subjective:  Follow up on hypertension; has not been seen since October; has not been taking her medication for at least the past 4 months; was able to get it at reasonable price at [facility] but found this to be an inconvenience since her other [facility] so she just stopped taking it;  Denies any chest pain, shortness of breath, blurred vision or headache Also requesting updated handicapped sticker today- has had one for the past 5 years due to arthritis issues; requesting permanent sticker if possible;    Objective:  Vitals:   07/11/19 1256  BP: (!) 150/90  Pulse: 90  Temp: 98.2 F (36.8 C)  TempSrc: Oral  SpO2: 96%  Weight: 248 lb 3.2 oz (112.6 kg)  Height: 5\' 4"  (1.626 m)    General: Well developed, well nourished, in no acute distress  Skin : Warm and dry.  Head: Normocephalic and atraumatic  Lungs: Respirations unlabored; clear to auscultation bilaterally without wheeze, rales, rhonchi  CVS exam: normal rate and regular rhythm.  Neurologic: Alert and oriented; speech intact; face symmetrical; moves all extremities  well; CNII-XII intact without focal deficit   Assessment:  1. Essential hypertension   2. Non-compliance   3. Hyperlipidemia with target LDL less than 130   4. Polyarthralgia     Plan:  1. Stressed need to take medication as prescribed; 3. She notes that she cannot afford to take cholesterol medication at this time and does not want a refill; 4. Handicapped sticker given as requested;  This visit occurred during the SARS-CoV-2 public health emergency.  Safety protocols were in place, including screening questions prior to the visit, additional usage of staff PPE, and extensive cleaning of exam room while observing appropriate contact time as indicated for disinfecting solutions.     Return in about 3 months (around 10/11/2019).  No orders of the defined types were placed in this encounter.   Requested Prescriptions   Signed Prescriptions Disp Refills  . amLODipine (NORVASC) 5 MG tablet 30 tablet 1    Sig: Take 1 tablet (5 mg total) by mouth daily.

## 2019-08-04 ENCOUNTER — Other Ambulatory Visit (HOSPITAL_COMMUNITY): Payer: Self-pay | Admitting: Psychiatry

## 2019-08-04 ENCOUNTER — Other Ambulatory Visit: Payer: Self-pay | Admitting: Family

## 2019-08-04 DIAGNOSIS — F4310 Post-traumatic stress disorder, unspecified: Secondary | ICD-10-CM

## 2019-08-04 DIAGNOSIS — F419 Anxiety disorder, unspecified: Secondary | ICD-10-CM

## 2019-08-10 ENCOUNTER — Encounter (HOSPITAL_COMMUNITY): Payer: Self-pay | Admitting: Psychiatry

## 2019-08-10 ENCOUNTER — Telehealth (INDEPENDENT_AMBULATORY_CARE_PROVIDER_SITE_OTHER): Payer: Medicare Other | Admitting: Psychiatry

## 2019-08-10 ENCOUNTER — Other Ambulatory Visit: Payer: Self-pay

## 2019-08-10 VITALS — Wt 243.0 lb

## 2019-08-10 DIAGNOSIS — F4310 Post-traumatic stress disorder, unspecified: Secondary | ICD-10-CM

## 2019-08-10 DIAGNOSIS — F319 Bipolar disorder, unspecified: Secondary | ICD-10-CM

## 2019-08-10 DIAGNOSIS — F419 Anxiety disorder, unspecified: Secondary | ICD-10-CM

## 2019-08-10 MED ORDER — CLONAZEPAM 0.5 MG PO TABS: 0.5000 mg | ORAL_TABLET | Freq: Three times a day (TID) | ORAL | 0 refills | Status: DC | PRN

## 2019-08-10 MED ORDER — QUETIAPINE FUMARATE 200 MG PO TABS: 200.0000 mg | ORAL_TABLET | Freq: Every day | ORAL | 0 refills | Status: DC
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
## 2019-08-17 ENCOUNTER — Ambulatory Visit (INDEPENDENT_AMBULATORY_CARE_PROVIDER_SITE_OTHER): Payer: Medicare Other | Admitting: Psychiatry

## 2019-08-17 ENCOUNTER — Other Ambulatory Visit: Payer: Self-pay

## 2019-08-17 DIAGNOSIS — F419 Anxiety disorder, unspecified: Secondary | ICD-10-CM

## 2019-08-17 DIAGNOSIS — F4310 Post-traumatic stress disorder, unspecified: Secondary | ICD-10-CM

## 2019-08-17 DIAGNOSIS — F319 Bipolar disorder, unspecified: Secondary | ICD-10-CM | POA: Diagnosis not present

## 2019-08-17 NOTE — Progress Notes (Signed)
Virtual Visit via Video Note  I connected with Shannon [name] on 08/17/19 at  3:30 PM EDT by a video enabled telemedicine application and verified that I am speaking with the correct person using two identifiers.  Location: Patient: Patient Home Provider: Home Office   I discussed the limitations of evaluation and management by telemedicine and the availability of in person appointments. The patient expressed understanding and agreed to proceed.  History of Present Illness: Bipolar 1 DO, PTSD, Anxiety  Treatment Plan Goals: 1) Shannon [name] would like to stop racing thoughts, through learning and implementing CBT techniques, such as thought stopping, distracting, positive self-talk, to journal, etc.  2) Shannon [name] would like to improve self-esteem and self-image by reducing negative self-talk, focusing on more rational, logical and realistic attributes of self vs distorted and emotional thinking. 3) Shannon [name] would like to "find who I was 5 years ago", noting that she would like to be happier, more confident, engaged in life and improved daily functioning.  Observations/Objective: Counselor met with Client for individual therapy via Webex. Counselor assessed MH symptoms and progress on treatment plan goals, with patient reporting  symptoms of severe depression and substance abuse, including 3 episodes of binge drinking in past 5 months, isolating, in bed most hours of day and night, lack of appetite, increased nicotine use, ruminating, anxiety, lack of personal hygiene, passive SI, hopelessness, helplessness, anger, wanting to escape from present situation. Client appeared anxious, tired, suspicious, and teary. Client presents with severe depression and severe anxiety. Client endorsed passive suicidal ideation, with no plan or means, stating "I don't want to die, or hurt myself, I just was to feel better." No report of self-harm acts.   Endorsed nicotine use to "cope with anxiety" and 3 accounts of binge  drinking, involving leaving home to drink at hotel over a 3 day period, until black out and recovery. Client stats that she does not have withdraw symptoms, or cravings, and does not drink in the home or public places, prefers in private in hotel. 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 Client explained that husband speaks english as a second language, first language being [place] and place of birth [place], recently immigrated.   Counselor gathered information to compile for Comprehensive Clinical Assessment. Client shared about historical events leading up to current mental health crisis, current stressors, supports, preferences in treatment, symptoms, family history and other background information, to capture the context of current need for treatment. Counselor offered supportive listening, empathy, validation, reframing and community resources throughout the session. Counselor and Client agreed to move forward with individual sessions, referral to MH-IOP and  CD-IOP, AA support groups, [facility] Peer Support and medication management. Client interested in individual therapy and getting connected with additional resources. Client noted being more hopeful, than at the start of session. Counselor provided client with crisis services/programs to prevent SI/Planning and binge drinking. Counselor to follow up with resources for husband in native language to better understand the impacts of mental health on his wife. Counselor to meet for follow up appointment in 2 weeks.    Assessment and Plan: Counselor will continue to meet with patient to address treatment plan goals. Patient will continue to follow recommendations  of providers and implement skills  learned in session.  Follow Up Instructions: Counselor will send information for next session via Webex.    The patient was advised to call back or  seek an in-person evaluation if the symptoms worsen or if the condition fails to improve as anticipated.  I provided 55 minutes of non-face-to-face time during this encounter.   Lise Auer, LCSW

## 2019-08-18 ENCOUNTER — Telehealth (HOSPITAL_COMMUNITY): Payer: Self-pay | Admitting: Psychiatry

## 2019-08-18 NOTE — Telephone Encounter (Signed)
D:  Lise Auer, LCSW referred pt to MH-IOP.  A:  Placed call to orient pt and provide a start date.  Patient is declining at this time, but states she will think about it and discuss further with Dr. Adele Schilder on 08-31-19.  "That's a long time to be in a group.  I don't do well in a group."  Reiterated to patient that it's only 3 hrs per day (M-F) for two weeks.  Encouraged pt to call case manager back once she sees Dr. Adele Schilder in July.  Inform [employer], LCSW.  R:  Pt receptive.

## 2019-08-24 ENCOUNTER — Encounter (HOSPITAL_COMMUNITY): Payer: Self-pay | Admitting: Psychiatry

## 2019-08-26 ENCOUNTER — Other Ambulatory Visit: Payer: Self-pay | Admitting: Family

## 2019-08-31 ENCOUNTER — Other Ambulatory Visit: Payer: Self-pay

## 2019-08-31 ENCOUNTER — Ambulatory Visit (HOSPITAL_COMMUNITY): Payer: Medicare Other | Admitting: Psychiatry

## 2019-08-31 ENCOUNTER — Encounter (HOSPITAL_COMMUNITY): Payer: Self-pay | Admitting: Psychiatry

## 2019-08-31 ENCOUNTER — Other Ambulatory Visit: Payer: Self-pay | Admitting: Family

## 2019-08-31 ENCOUNTER — Telehealth (INDEPENDENT_AMBULATORY_CARE_PROVIDER_SITE_OTHER): Payer: Medicare Other | Admitting: Psychiatry

## 2019-08-31 DIAGNOSIS — F319 Bipolar disorder, unspecified: Secondary | ICD-10-CM

## 2019-08-31 DIAGNOSIS — F4310 Post-traumatic stress disorder, unspecified: Secondary | ICD-10-CM | POA: Diagnosis not present

## 2019-08-31 MED ORDER — FLUOXETINE HCL 10 MG PO CAPS: ORAL_CAPSULE | ORAL | 1 refills | Status: DC

## 2019-08-31 MED ORDER — CLONAZEPAM 0.5 MG PO TABS: 0.5000 mg | ORAL_TABLET | Freq: Three times a day (TID) | ORAL | 1 refills | Status: DC | PRN

## 2019-08-31 MED ORDER — QUETIAPINE FUMARATE 200 MG PO TABS: 200.0000 mg | ORAL_TABLET | Freq: Every day | ORAL | 1 refills | Status: DC
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
## 2019-09-01 ENCOUNTER — Telehealth (HOSPITAL_COMMUNITY): Payer: Self-pay | Admitting: *Deleted

## 2019-09-01 NOTE — Telephone Encounter (Signed)
Pt of Dr. Adele Schilder who was seen on 08/31/19 ans says that she discussed changing sleep medication; currently taking Seroquel 200mg  qhs. Pt does not want to increase dosage as she has restless leg and does not want to try Requip either. Pt is willing to try besides Seroquel. She has tried Melatonin and Benadryl with no results. Pt is very concerned about this. Please review and advise.

## 2019-09-03 ENCOUNTER — Other Ambulatory Visit: Payer: Self-pay | Admitting: Family

## 2019-09-04 ENCOUNTER — Telehealth (HOSPITAL_COMMUNITY): Payer: Self-pay | Admitting: *Deleted

## 2019-09-04 ENCOUNTER — Other Ambulatory Visit (HOSPITAL_COMMUNITY): Payer: Self-pay | Admitting: Psychiatry

## 2019-09-04 MED ORDER — TEMAZEPAM 15 MG PO CAPS: 15.0000 mg | ORAL_CAPSULE | Freq: Every evening | ORAL | 0 refills | Status: DC | PRN

## 2019-09-04 MED ORDER — BELSOMRA 15 MG PO TABS: 15.0000 mg | ORAL_TABLET | Freq: Every evening | ORAL | 0 refills | Status: DC | PRN

## 2019-09-04 NOTE — Telephone Encounter (Signed)
Pt called writer back after checking to see if [employer] would cover the Belsomra. Unfortunately she would have a $75 co-pay which she cannot afford monthly. Pt has [employer] only. Please review and advise.

## 2019-09-04 NOTE — Telephone Encounter (Signed)
Let's try suvorexant (Belsomra) - should not cause sleepwalking like Ambien did. She can combine it with Seroquel.

## 2019-09-04 NOTE — Telephone Encounter (Signed)
I understand - let 's then try Restoril 15 mg prn , not ideal since she is already on benzodiazepine for anxiety but it should be affordable and maybe will work.

## 2019-09-04 NOTE — Telephone Encounter (Signed)
Writer spoke with pt to inform and educate about change to Belsomra 15mg  to replace Ambien. Pt verbalizes understanding and will call with any issues.

## 2019-09-06 ENCOUNTER — Other Ambulatory Visit (HOSPITAL_COMMUNITY): Payer: Self-pay | Admitting: Psychiatry

## 2019-09-06 DIAGNOSIS — F5101 Primary insomnia: Secondary | ICD-10-CM

## 2019-09-06 DIAGNOSIS — F319 Bipolar disorder, unspecified: Secondary | ICD-10-CM

## 2019-09-11 ENCOUNTER — Encounter (HOSPITAL_COMMUNITY): Payer: Self-pay | Admitting: Psychiatry

## 2019-09-11 ENCOUNTER — Ambulatory Visit (INDEPENDENT_AMBULATORY_CARE_PROVIDER_SITE_OTHER): Payer: Medicare Other | Admitting: Psychiatry

## 2019-09-11 ENCOUNTER — Other Ambulatory Visit: Payer: Self-pay

## 2019-09-11 DIAGNOSIS — F319 Bipolar disorder, unspecified: Secondary | ICD-10-CM

## 2019-09-11 DIAGNOSIS — F4310 Post-traumatic stress disorder, unspecified: Secondary | ICD-10-CM

## 2019-09-11 NOTE — Progress Notes (Signed)
Virtual Visit via Video Note  I connected with Shannon [name] on 09/11/19 at  1:30 PM EDT by a video enabled telemedicine application and verified that I am speaking with the correct person using two identifiers.  Location: Patient: Patient Home Provider: Home Office   I discussed the limitations of evaluation and management by telemedicine and the availability of in person appointments. The patient expressed understanding and agreed to proceed.  History of Present Illness: Bipolar 1 DO, PTSD, Anxiety  Treatment Plan Goals: 1) Shannon [name] would like to stop racing thoughts, through learning and implementing CBT techniques, such as thought stopping, distracting, positive self-talk, to journal, etc.  2) Shannon [name] would like to improve self-esteem and self-image by reducing negative self-talk, focusing on more rational, logical and realistic attributes of self vs distorted and emotional thinking. 3) Shannon [name] would like to "find who I was 5 years ago", noting that she would like to be happier, more confident, engaged in life and improved daily functioning.  Observations/Objective: Counselor met with Client for individual therapy via Webex. Counselor assessed MH symptoms and progress on treatment plan goals, with patient reporting that depressive and anxiety symptoms remain at same intensity, with negative cognitions, lack of self-care/up keep of hygiene, fatigue, only getting out of bed to use the restroom, barely eating, and hopelessness. Client reports low self-esteem, self-blame and intentional isolation. Client presents with severe depression and severe anxiety. Client denied suicidal ideation or self-harm behaviors.   Client reported that she decided not to engage in IOP treatment program because of financial reasons. She would like to work on individual basis to work through problems first. Client refused to attend [employer], as she does not label her alcohol use as an addiction or alcoholism.  Counselor shared psychoeducation on criteria for alcoholism, with Client refusing to identify with the information. Counselor assessed progress on goal 1, with client noting that she attempts to watch youtube videos to stop her negative thoughts. Client was unsure how to start a journal, so we discussed and Counselor provided "how to" guideline for inspiration. Client expressed interest in attempting practice. Counselor engaged in CBT skills with client to develop positive self talk practices.   In addressing goal 2, counselor challenged the client to look at self in mirror 1 minute a day, to brush teeth/hair once daily and to challenge negative cognitions with thought replacement techniques. Client to send examples between sessions.   In addressing goal 3, counselor and client processed and explored childhood adverse experiences and traumas. Counselor attempting to get a deeper understanding of life experiences and to visualize all factors that have shaped client view of self, others and society. Client was visibly teary, upset, anxious, restless and upset by trauma reminders. Client to work on discussed homework between sessions.   Assessment and Plan: Counselor will continue to meet with patient to address treatment plan goals. Patient will continue to follow recommendations of providers and implement skills learned in session.  Follow Up Instructions: Counselor will send information for next session via Webex.    The patient was advised to call back or seek an in-person evaluation if the symptoms worsen or if the condition fails to improve as anticipated.  I provided 55 minutes of non-face-to-face time during this encounter.   Lise Auer, LCSW

## 2019-09-15 ENCOUNTER — Other Ambulatory Visit: Payer: Self-pay | Admitting: Family Medicine

## 2019-09-15 ENCOUNTER — Other Ambulatory Visit: Payer: Self-pay | Admitting: Family

## 2019-09-15 NOTE — Telephone Encounter (Signed)
Called to schedule virtual visit. Scheduled for Monday. Patient hasn't been seen for a year.

## 2019-09-18 ENCOUNTER — Ambulatory Visit (INDEPENDENT_AMBULATORY_CARE_PROVIDER_SITE_OTHER): Payer: Medicare Other | Admitting: Psychiatry

## 2019-09-18 ENCOUNTER — Encounter: Payer: Self-pay | Admitting: Family Medicine

## 2019-09-18 ENCOUNTER — Other Ambulatory Visit: Payer: Self-pay

## 2019-09-18 ENCOUNTER — Encounter (HOSPITAL_COMMUNITY): Payer: Self-pay | Admitting: Psychiatry

## 2019-09-18 ENCOUNTER — Ambulatory Visit (INDEPENDENT_AMBULATORY_CARE_PROVIDER_SITE_OTHER): Payer: Medicare Other | Admitting: Family Medicine

## 2019-09-18 DIAGNOSIS — G5703 Lesion of sciatic nerve, bilateral lower limbs: Secondary | ICD-10-CM

## 2019-09-18 DIAGNOSIS — M2550 Pain in unspecified joint: Secondary | ICD-10-CM | POA: Diagnosis not present

## 2019-09-18 DIAGNOSIS — M5137 Other intervertebral disc degeneration, lumbosacral region: Secondary | ICD-10-CM | POA: Diagnosis not present

## 2019-09-18 DIAGNOSIS — F319 Bipolar disorder, unspecified: Secondary | ICD-10-CM

## 2019-09-18 MED ORDER — VITAMIN D (ERGOCALCIFEROL) 1.25 MG (50000 UNIT) PO CAPS: 50000.0000 [IU] | ORAL_CAPSULE | ORAL | 0 refills | Status: DC

## 2019-09-18 MED ORDER — PREDNISONE 20 MG PO TABS
20.0000 mg | ORAL_TABLET | Freq: Every day | ORAL | 0 refills | Status: DC
Start: 2019-09-18 — End: 2019-11-22

## 2019-09-18 NOTE — Progress Notes (Signed)
Virtual Visit via Video Note  I connected with [name] on 09/18/19 at  4:15 PM EDT by a video enabled telemedicine application and verified that I am speaking with the correct person using two identifiers.  Location: Patient: Patient is alone in home setting but difficulty with virtual platform so did on the phone call Provider: Alone in office   I discussed the limitations of evaluation and management by telemedicine and the availability of in person appointments. The patient expressed understanding and agreed to proceed.  History of Present Illness: 54 year old female with past medical history that is significant for mixed bipolar disorder who is having an exacerbation and having worsening discomfort and pain.  Has been found to have low vitamin D levels that did help a lot of her pain previously.  Starting to have increasing pain again and would like a refill.  Patient's last vitamin D check was in May 2020.  Patient since then since she has been off the medication for months now is having more pain and describes it more on the buttocks region and because of her wonderful research she has been diagnosed as coccydynia.    Observations/Objective: Patient is alert.  Did have some tangential thought difficult to keep patient on track.   Assessment and Plan: Patient is a 54 year old who has had degenerative disc disease of the lumbar spine who is having more signs and symptoms she is consistent with possible coccydynia.  We discussed potential prednisone and warned of potential side effects.  We discussed that I would like a vitamin D level but patient did not want to come in because of secondary to social determinants of health difficulty coming in because of Covid related nervousness as well as patient is not driving at the moment.  Patient will have this done in the near future.  I did refill patient's vitamin D for 2 months.  Prednisone 20 mg daily for 1 week.  Depending on laboratory  work-up that would count change.  We have had an elevated sedimentation rate previously and we will see if that still elevated.  Follow-up again in office in 2 months if continuing to have trouble   Follow Up Instructions: 2 months in office having trouble    I discussed the assessment and treatment plan with the patient. The patient was provided an opportunity to ask questions and all were answered. The patient agreed with the plan and demonstrated an understanding of the instructions.   The patient was advised to call back or seek an in-person evaluation if the symptoms worsen or if the condition fails to improve as anticipated.  I provided 35 minutes of non-face-to-face time during this encounter.  Had difficulty with virtual platform this included review on the same date of patient's chart including laboratory work-up and imaging   Lyndal Pulley, DO

## 2019-09-18 NOTE — Progress Notes (Signed)
Virtual Visit via Video Note  I connected with Shannon [name] on 09/18/19 at  1:30 PM EDT by a video enabled telemedicine application and verified that I am speaking with the correct person using two identifiers.  Location: Patient: Patient Home Provider: Home Office  I discussed the limitations of evaluation and management by telemedicine and the availability of in person appointments. The patient expressed understanding and agreed to proceed.  History of Present Illness: Bipolar 1 DO, PTSD, Anxiety  Treatment Plan Goals: 1)Shannon [name] would like to stop racing thoughts, through learning and implementing CBT techniques, such as thought stopping, distracting, positive self-talk, to journal, etc. 2)Shannon [name] would like to improve self-esteem and self-image by reducing negative self-talk, focusing on more rational, logical and realistic attributes of self vs distorted and emotional thinking. 3)Shannon [name] would like to "find who I was 5 years ago", noting that she would like to be happier, more confident, engaged in life and improved daily functioning.  Observations/Objective: Counselor met with Client for individual therapy via Webex. Counselor assessed MH symptoms and progress on treatment plan goals, with patient reporting that she is implementing skills learned in session, such as journaling, positive self-talk and cognitive restructuring. Client noted improvement in self-care practices, reporting on showering, lotioning, hair care, wearing different clothes and preparing her and her husband a meal. Client noted that implementing self-care practices reminded herself of who she use to be and motivated her to want to do more self-care. Client presents with severe depression and severe anxiety. Client endorsed passive suicidal ideation, with no plan or desire to implement lethal measures and denied self-harm behaviors.   Counselor assessed progress in the therapeutic relationship and engagement in  treatment. Client noted that this is her first treatment experience where she feels heard, understood and free to share what is going on internally for her. Client has identified that there is another part of herself that is controlling her, it is the younger, traumatized and fearful part of her. Counselor discussed process of ruling out a variety of diagnoses, in conjunction with attending to her and the other part of her's needs for wellness and healing. Counselor provided cognitive restructuring techniques and positive self-talk for the client to implement when the other part of her tries to control, self- sabotage and interfere with her progress and healing. Client understood the concept and is willing to reflect on what the needs are for the other part of her. Counselor assessed daily functioing with client reporting positive behavior change in daily care, socializing and alleviation of anxiety and depressive symptoms. Counselor prompted Client to share about involving support system in treatment, with Client expressing concern that she has limited support due to "burning bridges" with friends when she has not been well in the past. Client noted that husband noticed and positively commented on her efforts to implement self-care practices. Client would like to improve connection with her faith and faith community for grounding and healing aspects. Counselor and Client discussed easing into practices of prayer and of studying the Quran. Client open to trying strategies discussed.   Assessment and Plan: Counselor will continue to meet with patient to address treatment plan goals. Patient will continue to follow recommendations of providers and implement skills learned in session.  Follow Up Instructions: Counselor will send information for next session via Webex.    The patient was advised to call back or seek an in-person evaluation if the symptoms worsen or if the condition fails to improve as  anticipated.  I  provided 55 minutes of non-face-to-face time during this encounter.   Lise Auer, LCSW

## 2019-09-22 ENCOUNTER — Other Ambulatory Visit (HOSPITAL_COMMUNITY): Payer: Self-pay | Admitting: Psychiatry

## 2019-09-22 DIAGNOSIS — F4310 Post-traumatic stress disorder, unspecified: Secondary | ICD-10-CM

## 2019-09-25 ENCOUNTER — Other Ambulatory Visit: Payer: Self-pay

## 2019-09-25 ENCOUNTER — Ambulatory Visit (INDEPENDENT_AMBULATORY_CARE_PROVIDER_SITE_OTHER): Payer: Medicare Other | Admitting: Psychiatry

## 2019-09-25 ENCOUNTER — Encounter (HOSPITAL_COMMUNITY): Payer: Self-pay | Admitting: Psychiatry

## 2019-09-25 DIAGNOSIS — F319 Bipolar disorder, unspecified: Secondary | ICD-10-CM | POA: Diagnosis not present

## 2019-09-25 DIAGNOSIS — F4310 Post-traumatic stress disorder, unspecified: Secondary | ICD-10-CM

## 2019-09-25 NOTE — Progress Notes (Signed)
Virtual Visit via Video Note  I connected with Tayten N Uliano on 09/25/19 at  1:30 PM EDT by a video enabled telemedicine application and verified that I am speaking with the correct person using two identifiers.  Location: Patient: Patient Home Provider: Home Office   I discussed the limitations of evaluation and management by telemedicine and the availability of in person appointments. The patient expressed understanding and agreed to proceed.  History of Present Illness: Bipolar 1 DO, PTSD, Anxiety  Treatment Plan Goals: 1)Sabrea would like to stop racing thoughts, through learning and implementing CBT techniques, such as thought stopping, distracting, positive self-talk, to journal, etc. 2)Shantai would like to improve self-esteem and self-image by reducing negative self-talk, focusing on more rational, logical and realistic attributes of self vs distorted and emotional thinking. 3)Paraskevi would like to "find who I was 5 years ago", noting that she would like to be happier, more confident, engaged in life and improved daily functioning.  Observations/Objective: Counselor met with Client for individual therapy via Webex. Counselor assessed MH symptoms and progress on treatment plan goals, with patient reporting continued progress on goals and improvement of symptoms/functioning. Client noted that she has reached out to several friends to let them know about her mental health condition and treatment. She challenged herself to drive 3 times this week, successfully. Client applied journaling, thought stopping, positive self talk, and distraction methods to be able to drive and go shopping, where she was unable to leave the house for past 2 months.  Client presents with moderate depression and moderate anxiety. Client denied suicidal ideation or self-harm behaviors. Through journaling and reporting on journal reflections, Client has identified that she has the little girl, traumatized  version of herself living inside her. She refers to this persona and voice as, "Tangie". Counselor provided trauma-based interventions to assess, monitor and observe her relationship with "Tangie" in order to quiet her voice and impact on the Client's mental health/functioning. Counselor and Client identified that "Tangie" has unmet needs and discussed ways to address this problem. Client will consider giving "Tangie" permission to take a break, take a vacation, to rest, etc or she will offer for her to join the Client in making positive healthy changes in life. Client vividly described "Tangie" and her stuck spot in relationship to childhood traumas. Counselor used empathetic listening skills and motivational interviewing techniques to determine course of action needed. Counselor challenged Client to continue applying self-care practices and to journal about thoughts that arose from today's session.    Assessment and Plan: Counselor will continue to meet with patient to address treatment plan goals. Patient will continue to follow recommendations of providers and implement skills learned in session.  Follow Up Instructions: Counselor will send information for next session via Webex.    The patient was advised to call back or seek an in-person evaluation if the symptoms worsen or if the condition fails to improve as anticipated.  I provided 55 minutes of non-face-to-face time during this encounter.    , LCSW  

## 2019-09-30 ENCOUNTER — Other Ambulatory Visit (HOSPITAL_COMMUNITY): Payer: Self-pay | Admitting: Psychiatry

## 2019-09-30 DIAGNOSIS — F319 Bipolar disorder, unspecified: Secondary | ICD-10-CM

## 2019-10-02 DIAGNOSIS — M533 Sacrococcygeal disorders, not elsewhere classified: Secondary | ICD-10-CM | POA: Diagnosis not present

## 2019-10-04 ENCOUNTER — Other Ambulatory Visit (HOSPITAL_COMMUNITY): Payer: Self-pay | Admitting: Psychiatry

## 2019-10-04 ENCOUNTER — Telehealth (HOSPITAL_COMMUNITY): Payer: Self-pay | Admitting: *Deleted

## 2019-10-04 MED ORDER — TEMAZEPAM 30 MG PO CAPS: 30.0000 mg | ORAL_CAPSULE | Freq: Every evening | ORAL | 0 refills | Status: DC | PRN

## 2019-10-04 NOTE — Telephone Encounter (Signed)
Patient called and stated she could not get Restoril from the pharmacy because they were unable to fill it because insurance only approves Restoril 30 mg for 30 days and will not approve 15 mg to take twice. Please review and advise. The patient is Dr Adele Schilder but you had prescribed the Resoril.

## 2019-10-04 NOTE — Telephone Encounter (Signed)
I then changed it to 30 mg prn insomnia

## 2019-10-08 ENCOUNTER — Other Ambulatory Visit: Payer: Self-pay | Admitting: Family

## 2019-10-09 ENCOUNTER — Other Ambulatory Visit: Payer: Self-pay

## 2019-10-09 ENCOUNTER — Encounter (HOSPITAL_COMMUNITY): Payer: Self-pay | Admitting: Psychiatry

## 2019-10-09 ENCOUNTER — Ambulatory Visit (INDEPENDENT_AMBULATORY_CARE_PROVIDER_SITE_OTHER): Payer: Medicare Other | Admitting: Psychiatry

## 2019-10-09 DIAGNOSIS — F319 Bipolar disorder, unspecified: Secondary | ICD-10-CM | POA: Diagnosis not present

## 2019-10-09 NOTE — Progress Notes (Signed)
Virtual Visit via Video Note  I connected with Shannon [name] on 10/09/19 at  1:30 PM EDT by a video enabled telemedicine application and verified that I am speaking with the correct person using two identifiers.  Location: Patient: Patient Home Provider: Home Office   I discussed the limitations of evaluation and management by telemedicine and the availability of in person appointments. The patient expressed understanding and agreed to proceed.  History of Present Illness: Bipolar 1 DO, PTSD, Anxiety  Treatment Plan Goals: 1)Shannon [name] would like to stop racing thoughts, through learning and implementing CBT techniques, such as thought stopping, distracting, positive self-talk, to journal, etc. 2)Shannon [name] would like to improve self-esteem and self-image by reducing negative self-talk, focusing on more rational, logical and realistic attributes of self vs distorted and emotional thinking. 3)Shannon [name] would like to "find who I was 5 years ago", noting that she would like to be happier, more confident, engaged in life and improved daily functioning.  Observations/Objective: Counselor met with Client for individual therapy via Webex. Counselor assessed MH symptoms and progress on treatment plan goals, with patient reporting that "Shannon [name]" (aka her racing thoughts and depression) has been quiet the past week, through applying CBT skills discussed in session. Counselor prompted client to share which skills were used, with client stating positive self-talk, inviting Shannon [name] to be a part of her healing, and distracting. Client notes feeling better about herself, as she has been able to spend quality time with her husband and with a close friend, where "Shannon [name]" was not allowing that to happen for the past year. Client reports working on self-care to improve self-esteem, such as bathing more frequently, getting dressed, hair care, and eating meals more frequently instead of snack foods only. Client  minimized her progress and was able to receive praise, celebration and encouragement from the Counselor to acknowledge the intentional steps she is taking towards stabilization, recovery and healing. Client presents with moderate depression and moderate anxiety. Client denied suicidal ideation or self-harm behaviors.   Counselor continued to assess progress, with client noting that she is aware that she has neglected her oral care over the past year and plans to set up a dentist appointment to address concerns. Client reports that her medication regimen is working with minimal side effects. She noted starting to attend to and address physical health concerns she has neglected.   Counselor and client worked toward creating a plan to increase connection and intimacy with her husband. Counselor shared psychoeducation on the act of self-forgiveness and extending forgiveness to others. Client desires to talk with her husband about mental health condition, unkind words/actions and the consequences thereof. Client will start making self more available for intimacy and connection by spending more time in her husbands room and discussing a plan to move him back into "their" room. Client to report back on progress at next session.   Assessment and Plan: Counselor will continue to meet with patient to address treatment plan goals. Patient will continue to follow recommendations of providers and implement skills learned in session.  Follow Up Instructions: Counselor will send information for next session via Webex.    The patient was advised to call back or seek an in-person evaluation if the symptoms worsen or if the condition fails to improve as anticipated.  I provided 55 minutes of non-face-to-face time during this encounter.   Lise Auer, LCSW

## 2019-10-16 ENCOUNTER — Encounter (HOSPITAL_COMMUNITY): Payer: Self-pay | Admitting: Psychiatry

## 2019-10-16 ENCOUNTER — Other Ambulatory Visit: Payer: Self-pay

## 2019-10-16 ENCOUNTER — Telehealth (INDEPENDENT_AMBULATORY_CARE_PROVIDER_SITE_OTHER): Payer: Medicare Other | Admitting: Psychiatry

## 2019-10-16 VITALS — Wt 240.0 lb

## 2019-10-16 DIAGNOSIS — F609 Personality disorder, unspecified: Secondary | ICD-10-CM

## 2019-10-16 DIAGNOSIS — F4310 Post-traumatic stress disorder, unspecified: Secondary | ICD-10-CM | POA: Diagnosis not present

## 2019-10-16 DIAGNOSIS — F319 Bipolar disorder, unspecified: Secondary | ICD-10-CM | POA: Diagnosis not present

## 2019-10-16 MED ORDER — TEMAZEPAM 30 MG PO CAPS: 30.0000 mg | ORAL_CAPSULE | Freq: Every evening | ORAL | 1 refills | Status: DC | PRN

## 2019-10-16 MED ORDER — FLUOXETINE HCL 20 MG PO CAPS: 20.0000 mg | ORAL_CAPSULE | Freq: Every day | ORAL | 1 refills | Status: DC

## 2019-10-16 MED ORDER — CLONAZEPAM 0.5 MG PO TABS: 0.5000 mg | ORAL_TABLET | Freq: Three times a day (TID) | ORAL | 1 refills | Status: DC | PRN

## 2019-10-16 MED ORDER — QUETIAPINE FUMARATE 200 MG PO TABS: 200.0000 mg | ORAL_TABLET | Freq: Every day | ORAL | 1 refills | Status: DC
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
## 2019-10-23 ENCOUNTER — Other Ambulatory Visit (HOSPITAL_COMMUNITY): Payer: Self-pay | Admitting: Psychiatry

## 2019-10-23 DIAGNOSIS — F4310 Post-traumatic stress disorder, unspecified: Secondary | ICD-10-CM

## 2019-10-26 ENCOUNTER — Ambulatory Visit (INDEPENDENT_AMBULATORY_CARE_PROVIDER_SITE_OTHER): Payer: Medicare Other | Admitting: Psychiatry

## 2019-10-26 ENCOUNTER — Encounter (HOSPITAL_COMMUNITY): Payer: Self-pay | Admitting: Psychiatry

## 2019-10-26 ENCOUNTER — Other Ambulatory Visit: Payer: Self-pay

## 2019-10-26 DIAGNOSIS — F319 Bipolar disorder, unspecified: Secondary | ICD-10-CM | POA: Diagnosis not present

## 2019-10-26 DIAGNOSIS — F4310 Post-traumatic stress disorder, unspecified: Secondary | ICD-10-CM

## 2019-10-26 NOTE — Progress Notes (Signed)
Virtual Visit via Video Note  Location: Patient: Patient Home Provider: Home Office   I discussed the limitations of evaluation and management by telemedicine and the availability of in person appointments. The patient expressed understanding and agreed to proceed.  History of Present Illness: Bipolar 1 DO, PTSD, Anxiety  Treatment Plan Goals: 1)Minka would like to stop racing thoughts, through learning and implementing CBT techniques, such as thought stopping, distracting, positive self-talk, to journal, etc. 2)Hydie would like to improve self-esteem and self-image by reducing negative self-talk, focusing on more rational, logical and realistic attributes of self vs distorted and emotional thinking. 3)Shaquila would like to "find who I was 5 years ago", noting that she would like to be happier, more confident, engaged in life and improved daily functioning.  Observations/Objective: Counselor met with Client for individual therapy via Webex. Counselor assessed MH symptoms and progress on treatment plan goals, with patient reporting that her overall functioning has improved as evidenced by daily self-care practices, engaging more in faith practices, quality time with spouse, cooking and eating multiple meals a day, getting out of home to socialize and a desire to volunteer again. Client presents with mild depression and moderate anxiety. Client denied suicidal ideation or self-harm behaviors.   Counselor assessed progress on goal 1, with Client reporting that "Gomez Cleverly" remains present, but quiet and cooperative, allowing her to be able to engage in daily activities. Counselor and Client discussed skills used, with Client noting that she is inviting "Tangie" to participate in daily activities, consulting with or including her. Counselor and Client discussed doing parallel work with both Client and "Tangie", to address her unmet needs in order to get her "unstuck" from her trauma and  developmentally. Client reports the efforts seem to be working. Counselor shared psychoeducation on trauma treatment and the process to determine if she is interested for herself and "Tangie". Client to think about and discuss at next session.  In relation to goal 2, Counselor assessed skill application and outcomes, with Client reporting that through positive self-talk and spending more time on self-care she is expressing self-love and compassion more often, she is smiling more, and allowing self to look presentable for self and others, particularly her husband. Client reports that these practices are positively improving her self-esteem and overall happiness. 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 Counselor praised Client for efforts and work in this area.  Counselor and Client spent remainder of time strategically planning and preparing for a desired faith event Client would like to attend. Counselor and Client identified potential areas of concern, how to incorporate "Tangie" in a way that makes sense, utilizing her support system and visualizing how she would like the event to go vs focusing on what could go wrong. Client is feeling more confident and excited to participate in faith event. Client hoping to surprise her husband and her "sisters" in the faith. Counselor celebrated Client's overall progress and successes and set some challenges and short term goals to work towards for next session.   Assessment and Plan: Counselor will continue to meet with patient to address treatment plan goals. Patient will continue to follow recommendations of providers and implement skills learned in session.  Follow Up Instructions: Counselor will send information for next session via Webex.    The patient was advised to call back or seek an in-person evaluation if the symptoms  worsen  or if the condition fails to improve as anticipated.  I provided 55 minutes of non-face-to-face time during this encounter.   Lise Auer, LCSW

## 2019-10-31 ENCOUNTER — Other Ambulatory Visit (HOSPITAL_COMMUNITY): Payer: Self-pay | Admitting: Psychiatry

## 2019-10-31 DIAGNOSIS — F4310 Post-traumatic stress disorder, unspecified: Secondary | ICD-10-CM

## 2019-10-31 DIAGNOSIS — F319 Bipolar disorder, unspecified: Secondary | ICD-10-CM

## 2019-11-01 DIAGNOSIS — M533 Sacrococcygeal disorders, not elsewhere classified: Secondary | ICD-10-CM | POA: Diagnosis not present

## 2019-11-02 ENCOUNTER — Other Ambulatory Visit: Payer: Self-pay

## 2019-11-02 ENCOUNTER — Ambulatory Visit (HOSPITAL_COMMUNITY): Payer: Medicare Other | Admitting: Psychiatry

## 2019-11-02 NOTE — Addendum Note (Signed)
Addended by: Lise Auer on: 11/02/2019 08:21 AM   Modules accepted: Level of Service

## 2019-11-03 ENCOUNTER — Other Ambulatory Visit: Payer: Self-pay | Admitting: Family

## 2019-11-09 ENCOUNTER — Ambulatory Visit (HOSPITAL_COMMUNITY): Payer: Medicare Other | Admitting: Psychiatry

## 2019-11-17 ENCOUNTER — Other Ambulatory Visit (HOSPITAL_COMMUNITY): Payer: Self-pay | Admitting: Psychiatry

## 2019-11-17 DIAGNOSIS — F4310 Post-traumatic stress disorder, unspecified: Secondary | ICD-10-CM

## 2019-11-18 ENCOUNTER — Other Ambulatory Visit: Payer: Self-pay | Admitting: Family

## 2019-11-20 ENCOUNTER — Telehealth: Payer: Self-pay | Admitting: Family

## 2019-11-20 MED ORDER — AMLODIPINE BESYLATE 5 MG PO TABS: 5.0000 mg | ORAL_TABLET | Freq: Every day | ORAL | 0 refills | Status: DC

## 2019-11-20 NOTE — Telephone Encounter (Signed)
Patient called and was requesting a refill for amLODipine (NORVASC) 5 MG tablet  Said that the pharmacy told her that she needed an office visit before she can get a refill but she is leaving to out of town for a funeral and was wondering if she could get a refill. She has an OV on 11/22/2019  [id number] - [place], [place] - [address] AT [address]

## 2019-11-22 ENCOUNTER — Telehealth: Payer: Self-pay

## 2019-11-22 ENCOUNTER — Telehealth: Payer: Self-pay | Admitting: Family

## 2019-11-22 ENCOUNTER — Other Ambulatory Visit: Payer: Self-pay

## 2019-11-22 ENCOUNTER — Ambulatory Visit (INDEPENDENT_AMBULATORY_CARE_PROVIDER_SITE_OTHER): Payer: Medicare Other | Admitting: Family

## 2019-11-22 ENCOUNTER — Encounter: Payer: Self-pay | Admitting: Family

## 2019-11-22 VITALS — BP 144/84 | HR 102 | Temp 98.5°F | Ht 64.0 in | Wt 261.0 lb

## 2019-11-22 DIAGNOSIS — K219 Gastro-esophageal reflux disease without esophagitis: Secondary | ICD-10-CM

## 2019-11-22 DIAGNOSIS — I10 Essential (primary) hypertension: Secondary | ICD-10-CM

## 2019-11-22 DIAGNOSIS — E559 Vitamin D deficiency, unspecified: Secondary | ICD-10-CM

## 2019-11-22 DIAGNOSIS — E785 Hyperlipidemia, unspecified: Secondary | ICD-10-CM | POA: Diagnosis not present

## 2019-11-22 MED ORDER — PANTOPRAZOLE SODIUM 40 MG PO TBEC: 40.0000 mg | DELAYED_RELEASE_TABLET | Freq: Every day | ORAL | 11 refills | Status: DC

## 2019-11-22 MED ORDER — AMLODIPINE BESYLATE 5 MG PO TABS: 5.0000 mg | ORAL_TABLET | Freq: Every day | ORAL | 11 refills | Status: DC

## 2019-11-22 MED ORDER — EZETIMIBE-SIMVASTATIN 10-40 MG PO TABS: 1.0000 | ORAL_TABLET | Freq: Every day | ORAL | 11 refills | Status: DC

## 2019-11-22 MED ORDER — VITAMIN D (ERGOCALCIFEROL) 1.25 MG (50000 UNIT) PO CAPS: 50000.0000 [IU] | ORAL_CAPSULE | ORAL | 1 refills | Status: DC

## 2019-11-22 NOTE — Telephone Encounter (Signed)
Patient called and said that the medication for her Vitamin D 5,000 units once a week the pharmacy is telling her that it is $120 and she said she cannot afford that. She was wondering if Jodi Mourning, FNP can send in Vitamin D 1.25mg  (50,000 units) to be taken  once a week.

## 2019-11-22 NOTE — Progress Notes (Signed)
Shannon [name] is a 54 y.o. female with the following history as recorded in EpicCare:  Patient Active Problem List   Diagnosis Date Noted  . Piriformis syndrome of both sides 09/02/2018  . Routine general medical examination at a health care facility 12/22/2016  . Sleep apnea 08/12/2016  . Colon cancer screening 01/24/2015  . GERD (gastroesophageal reflux disease) 12/26/2014  . GAD (generalized anxiety disorder) 12/28/2013  . Abnormal vaginal Pap smear 10/25/2013  . Thiamin deficiency 10/30/2012  . Vitamin D deficiency 10/27/2012  . Other abnormal glucose 10/26/2012  . Mixed bipolar I disorder ([place]) 07/12/2012  . Hypokalemia, inadequate intake 09/01/2011  . Insomnia 04/04/2011  . Visit for screening mammogram 02/26/2011  . Hx of laparoscopic gastric banding 09/03/2010  . Tobacco abuse 06/09/2010  . Hyperlipidemia with target LDL less than 130 01/25/2009  . Morbid obesity ([place]) 01/25/2009  . Iron deficiency anemia 01/25/2009  . Essential hypertension 01/25/2009  . Allergic rhinitis 01/25/2009  . [place] DISEASE, LUMBAR 01/25/2009  . Backache 01/25/2009  . Leiomyoma of uterus, unspecified 08/13/2008    Current Outpatient Medications  Medication Sig Dispense Refill  . amLODipine (NORVASC) 5 MG tablet Take 1 tablet (5 mg total) by mouth daily. 30 tablet 11  . BELSOMRA 15 MG TABS Take 1 tablet by mouth at bedtime as needed.    . clonazePAM (KLONOPIN) 0.5 MG tablet Take 1 tablet (0.5 mg total) by mouth 3 (three) times daily as needed for anxiety. 90 tablet 1  . ezetimibe-simvastatin (VYTORIN) 10-40 MG tablet Take 1 tablet by mouth daily. 30 tablet 11  . FLUoxetine (PROZAC) 20 MG capsule Take 1 capsule (20 mg total) by mouth daily. 30 capsule 1  . Multiple Vitamin (MULTIVITAMIN) tablet Take 1 tablet by mouth daily. Reported on 09/09/2015    . pantoprazole (PROTONIX) 40 MG tablet Take 1 tablet (40 mg total) by mouth daily. 30 tablet 11  . QUEtiapine (SEROQUEL) 200 MG tablet Take 1  tablet (200 mg total) by mouth at bedtime. 30 tablet 1  . temazepam (RESTORIL) 30 MG capsule TAKE 1 CAPSULE (30 MG TOTAL) BY MOUTH AT BEDTIME AS NEEDED FOR SLEEP. 30 capsule 0  . thiamine ([facility] B-1) 100 MG tablet Take 1 tablet (100 mg total) by mouth daily. 30 tablet 3  . Vitamin D, Ergocalciferol, (DRISDOL) 1.25 MG (50000 UNIT) CAPS capsule Take 1 capsule (50,000 Units total) by mouth every 7 (seven) days. 12 capsule 1   No current facility-administered medications for this visit.    Allergies: Lamictal [lamotrigine]  Past Medical History:  Diagnosis Date  . Allergic rhinitis   . Allergic rhinitis 01/25/2009       . ANEMIA-IRON DEFICIENCY 01/25/2009  . Anxiety   . Backache 01/25/2009   Qualifier: Diagnosis of  By: Jenny Reichmann MD, Hunt Oris   . Bipolar 1 disorder ([place])   . Chronic pain syndrome   . Complete rotator cuff tear 07/21/2013  . Complete tear of right rotator cuff 07/21/2013  . Contact lens/glasses fitting   . [place] DISEASE, LUMBAR 01/25/2009  . Essential hypertension 62/09/6379   On Bystolic   . GAD (generalized anxiety disorder) 12/28/2013  . GERD (gastroesophageal reflux disease)   . 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  . Headache, acute 12/20/2013  . Hx of laparoscopic gastric banding 09/03/2010   Surgery date: 07/17/10   . HYPERLIPIDEMIA 01/25/2009  . Hyperlipidemia with target LDL less than 130 01/25/2009  . HYPERTENSION 01/25/2009  . Hypokalemia, inadequate intake 09/01/2011  . Insomnia 04/04/2011  . INSOMNIA-SLEEP  DISORDER-UNSPEC 04/30/2009   Qualifier: Diagnosis of  By: Jenny Reichmann MD, Hunt Oris   . Iron deficiency anemia 01/25/2009       . Labyrinthitis 02/19/2014  . Leiomyoma of uterus, unspecified 08/13/2008   Overview:  Leiomyoma Of The Uterus  10/1 IMO update  . Localized edema 06/12/2015  . MANIC DEPRESSIVE ILLNESS 01/25/2009   pt is unsue if this is her specifc dx  . Mixed bipolar I disorder ([place]) 07/12/2012  . Morbid obesity ([place]) 01/25/2009  . Night sweats   . Other abnormal glucose  10/26/2012  . Palpitations 10/25/2013  . PEPTIC ULCER DISEASE, HELICOBACTER PYLORI POSITIVE 10/03/2009  . Peripheral edema 06/25/2015  . Pernicious anemia 03/28/2012  . Piriformis syndrome of both sides 09/02/2018  . PUD (peptic ulcer disease) 10/03/2009       . Right shoulder pain 05/03/2013   Dg Shoulder Right  05/03/2013   CLINICAL DATA Pain.  EXAM RIGHT SHOULDER - 2+ VIEW  COMPARISON None.  FINDINGS Acromioclavicular and glenohumeral degenerative change present. Questionable calcific density noted in the region of the supraspinatus space, possibly representing calcific supraspinatus tendinitis. This could represent a sclerotic density in the acromion. MRI of the right shoulder suggest  . Sleep apnea 08/12/2016  . SMOKER 01/25/2009   Qualifier: Diagnosis of  By: Jenny Reichmann MD, Hunt Oris   . Subacromial bursitis 05/17/2013  . SUBSTANCE ABUSE 11/06/2009       . Thiamin deficiency 10/30/2012  . Tobacco abuse 06/09/2010  . Visual disturbance 05/03/2013  . Vitamin D deficiency 10/27/2012    Past Surgical History:  Procedure Laterality Date  . ABDOMINAL HYSTERECTOMY    . BLADDER SURGERY     s/p with ?diverticulitis  . HAND TENDON SURGERY  1991   s/p-Right-index and middle  . LAPAROSCOPIC GASTRIC BAND REMOVAL WITH LAPAROSCOPIC GASTRIC SLEEVE RESECTION N/A 08/17/2016   Procedure: LAPAROSCOPIC GASTRIC BAND REMOVAL WITH LAPAROSCOPIC GASTRIC SLEEVE RESECTION WITH UPPER ENDO;  Surgeon: Alphonsa Overall, MD;  Location: [facility];  Service: General;  Laterality: N/A;  . LAPAROSCOPIC GASTRIC BANDING  07/14/10  . SHOULDER ARTHROSCOPY Right 07/21/2013   Procedure: RIGHT ARTHROSCOPY SHOULDER DEBRIDMENT EXTENTSIVE,ARTHROSCOPIC REMOVE LOOSE FOREIGN BODY, BICEPS TENOLYSIS ;  Surgeon: Johnny Bridge, MD;  Location: [facility];  Service: Orthopedics;  Laterality: Right;  . TUBAL LIGATION      Family History  Problem Relation Age of Onset  . Hypertension Mother   . Asthma Mother   . Stroke Father   . Cirrhosis Father         ETOH  . Hypertension Father   . Diabetes Father   . Alcohol abuse Father   . Hypertension Sister   . Asthma Sister   . Hypertension Brother   . ADD / ADHD Son   . Hypertension Paternal Aunt   . Diabetes Maternal Grandmother   . ADD / ADHD Other        3 nephews, also emotional issues  . Diabetes Other        Uncle  . Diabetes Other        Aunt  . Suicidality Neg Hx     Social History   Tobacco Use  . Smoking status: Current Every Day Smoker    Packs/day: 0.25    Years: 20.00    Pack years: 5.00    Types: Cigarettes  . Smokeless tobacco: Never Used  . Tobacco comment: States has cut back to 5-7 a day and working on this.   Substance Use Topics  .  Alcohol use: No    Alcohol/week: 0.0 standard drinks    Subjective:  Follow-up on chronic care needs: including hypertension, hyperlipidemia, GERD; admits she has not been taking medication as prescribed but is working to start taking better care of herself; feels that current therapy is very beneficial;  Objective:  Vitals:   11/22/19 1012  BP: (!) 144/84  Pulse: (!) 102  Temp: 98.5 F (36.9 C)  TempSrc: Oral  SpO2: 98%  Weight: 261 lb (118.4 kg)  Height: 5' 4"  (1.626 m)    General: Well developed, well nourished, in no acute distress  Head: Normocephalic and atraumatic  Lungs: Respirations unlabored; clear to auscultation bilaterally without wheeze, rales, rhonchi  CVS exam: normal rate and regular rhythm.  Vessels: Symmetric bilaterally  Neurologic: Alert and oriented; speech intact; face symmetrical; moves all extremities well; CNII-XII intact without focal deficit   Assessment:  1. Essential hypertension   2. Hyperlipidemia with target LDL less than 130   3. Vitamin D deficiency   4. Gastroesophageal reflux disease without esophagitis   5. Morbid obesity ([place]) Chronic    Plan:  Labs and refills updated; encouraged to take medications daily as prescribed; She defers mammogram/ agrees to [employer] and this  will be ordered; She will get her flu shot at her pharmacy as well as 2nd COVID vaccine; She is committed to try and exercise and work on weight loss as part of her self care.  This visit occurred during the SARS-CoV-2 public health emergency.  Safety protocols were in place, including screening questions prior to the visit, additional usage of staff PPE, and extensive cleaning of exam room while observing appropriate contact time as indicated for disinfecting solutions.     No follow-ups on file.  Orders Placed This Encounter  Procedures  . CBC with Differential/Platelet    Standing Status:   Future    Number of Occurrences:   1    Standing Expiration Date:   11/21/2020  . Comp Met (CMET)    Standing Status:   Future    Number of Occurrences:   1    Standing Expiration Date:   11/21/2020  . Lipid panel    Standing Status:   Future    Number of Occurrences:   1    Standing Expiration Date:   11/21/2020  . Vitamin D (25 hydroxy)    Standing Status:   Future    Number of Occurrences:   1    Standing Expiration Date:   11/21/2020    Requested Prescriptions   Signed Prescriptions Disp Refills  . amLODipine (NORVASC) 5 MG tablet 30 tablet 11    Sig: Take 1 tablet (5 mg total) by mouth daily.  Marland Kitchen ezetimibe-simvastatin (VYTORIN) 10-40 MG tablet 30 tablet 11    Sig: Take 1 tablet by mouth daily.  . pantoprazole (PROTONIX) 40 MG tablet 30 tablet 11    Sig: Take 1 tablet (40 mg total) by mouth daily.  . Vitamin D, Ergocalciferol, (DRISDOL) 1.25 MG (50000 UNIT) CAPS capsule 12 capsule 1    Sig: Take 1 capsule (50,000 Units total) by mouth every 7 (seven) days.

## 2019-11-23 LAB — LIPID PANEL
Cholesterol: 262 mg/dL — ABNORMAL HIGH (ref <200)
HDL: 57 mg/dL (ref >=50)
LDL Cholesterol (Calc): 180 mg/dL (calc) — ABNORMAL HIGH
Non-HDL Cholesterol (Calc): 205 mg/dL (calc) — ABNORMAL HIGH (ref <130)
Total CHOL/HDL Ratio: 4.6 (calc) (ref <5.0)
Triglycerides: 121 mg/dL (ref <150)

## 2019-11-23 LAB — COMPREHENSIVE METABOLIC PANEL
AG Ratio: 1.1 (calc) (ref 1.0–2.5)
ALT: 10 U/L (ref 6–29)
AST: 11 U/L (ref 10–35)
Albumin: 3.7 g/dL (ref 3.6–5.1)
Alkaline phosphatase (APISO): 117 U/L (ref 37–153)
BUN: 9 mg/dL (ref 7–25)
CO2: 28 mmol/L (ref 20–32)
Calcium: 8.6 mg/dL (ref 8.6–10.4)
Chloride: 104 mmol/L (ref 98–110)
Creat: 0.71 mg/dL (ref 0.50–1.05)
Globulin: 3.4 g/dL (calc) (ref 1.9–3.7)
Glucose, Bld: 79 mg/dL (ref 65–99)
Potassium: 4.4 mmol/L (ref 3.5–5.3)
Sodium: 141 mmol/L (ref 135–146)
Total Bilirubin: 0.7 mg/dL (ref 0.2–1.2)
Total Protein: 7.1 g/dL (ref 6.1–8.1)

## 2019-11-23 LAB — CBC WITH DIFFERENTIAL/PLATELET
Absolute Monocytes: 502 cells/uL (ref 200–950)
Basophils Absolute: 53 cells/uL (ref 0–200)
Basophils Relative: 0.7 %
Eosinophils Absolute: 129 cells/uL (ref 15–500)
Eosinophils Relative: 1.7 %
HCT: 37.1 % (ref 35.0–45.0)
Hemoglobin: 12.3 g/dL (ref 11.7–15.5)
Lymphs Abs: 2098 cells/uL (ref 850–3900)
MCH: 31.5 pg (ref 27.0–33.0)
MCHC: 33.2 g/dL (ref 32.0–36.0)
MCV: 95.1 fL (ref 80.0–100.0)
MPV: 10.2 fL (ref 7.5–12.5)
Monocytes Relative: 6.6 %
Neutro Abs: 4818 cells/uL (ref 1500–7800)
Neutrophils Relative %: 63.4 %
Platelets: 318 10*3/uL (ref 140–400)
RBC: 3.9 10*6/uL (ref 3.80–5.10)
RDW: 20.2 % — ABNORMAL HIGH (ref 11.0–15.0)
Total Lymphocyte: 27.6 %
WBC: 7.6 10*3/uL (ref 3.8–10.8)

## 2019-11-23 LAB — VITAMIN D 25 HYDROXY (VIT D DEFICIENCY, FRACTURES): Vit D, 25-Hydroxy: 43 ng/mL (ref 30–100)

## 2019-11-24 NOTE — Telephone Encounter (Signed)
What she is requesting is what was originally prescribed; I don't have another alternative for her as a prescription. She can take 2000 IU Vitamin D3 daily OTC as an alternative.

## 2019-11-27 NOTE — Telephone Encounter (Signed)
Patient was called and not reached to go other medication information

## 2019-11-28 ENCOUNTER — Telehealth: Payer: Self-pay | Admitting: Family

## 2019-11-28 ENCOUNTER — Telehealth (INDEPENDENT_AMBULATORY_CARE_PROVIDER_SITE_OTHER): Payer: Medicare Other | Admitting: Psychiatry

## 2019-11-28 ENCOUNTER — Other Ambulatory Visit: Payer: Self-pay

## 2019-11-28 ENCOUNTER — Encounter (HOSPITAL_COMMUNITY): Payer: Self-pay | Admitting: Psychiatry

## 2019-11-28 ENCOUNTER — Ambulatory Visit (HOSPITAL_COMMUNITY): Payer: Medicare Other | Admitting: Psychiatry

## 2019-11-28 VITALS — Wt 261.0 lb

## 2019-11-28 DIAGNOSIS — F319 Bipolar disorder, unspecified: Secondary | ICD-10-CM | POA: Diagnosis not present

## 2019-11-28 DIAGNOSIS — F609 Personality disorder, unspecified: Secondary | ICD-10-CM

## 2019-11-28 DIAGNOSIS — F4310 Post-traumatic stress disorder, unspecified: Secondary | ICD-10-CM | POA: Diagnosis not present

## 2019-11-28 MED ORDER — CLONAZEPAM 0.5 MG PO TABS: 0.5000 mg | ORAL_TABLET | Freq: Three times a day (TID) | ORAL | 1 refills | Status: DC | PRN

## 2019-11-28 MED ORDER — TEMAZEPAM 30 MG PO CAPS: 30.0000 mg | ORAL_CAPSULE | Freq: Every evening | ORAL | 1 refills | Status: DC | PRN

## 2019-11-28 MED ORDER — ATORVASTATIN CALCIUM 20 MG PO TABS: 20.0000 mg | ORAL_TABLET | Freq: Every day | ORAL | 11 refills | Status: DC

## 2019-11-28 MED ORDER — FLUOXETINE HCL 20 MG PO CAPS: 20.0000 mg | ORAL_CAPSULE | Freq: Every day | ORAL | 1 refills | Status: DC

## 2019-11-28 MED ORDER — QUETIAPINE FUMARATE 200 MG PO TABS: 200.0000 mg | ORAL_TABLET | Freq: Every day | ORAL | 1 refills | Status: DC

## 2019-11-28 NOTE — Telephone Encounter (Signed)
Due to potential medication interaction, I want to change her Vytorin to a different cholesterol medication. D/C Vytorin and change to Lipitor 20 mg daily;

## 2019-11-28 NOTE — Telephone Encounter (Signed)
Second call patient not reached however voicemail concerning doctors notes was left.
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
## 2019-11-29 DIAGNOSIS — M461 Sacroiliitis, not elsewhere classified: Secondary | ICD-10-CM | POA: Diagnosis not present

## 2019-11-29 DIAGNOSIS — M47816 Spondylosis without myelopathy or radiculopathy, lumbar region: Secondary | ICD-10-CM | POA: Diagnosis not present

## 2019-11-29 DIAGNOSIS — R600 Localized edema: Secondary | ICD-10-CM | POA: Diagnosis not present

## 2019-11-29 DIAGNOSIS — M5137 Other intervertebral disc degeneration, lumbosacral region: Secondary | ICD-10-CM | POA: Diagnosis not present

## 2019-11-29 DIAGNOSIS — M5126 Other intervertebral disc displacement, lumbar region: Secondary | ICD-10-CM | POA: Diagnosis not present

## 2019-11-30 ENCOUNTER — Other Ambulatory Visit (HOSPITAL_COMMUNITY): Payer: Self-pay | Admitting: Psychiatry

## 2019-11-30 ENCOUNTER — Other Ambulatory Visit: Payer: Self-pay

## 2019-11-30 DIAGNOSIS — F4310 Post-traumatic stress disorder, unspecified: Secondary | ICD-10-CM

## 2019-11-30 DIAGNOSIS — Z1211 Encounter for screening for malignant neoplasm of colon: Secondary | ICD-10-CM

## 2019-12-01 DIAGNOSIS — M533 Sacrococcygeal disorders, not elsewhere classified: Secondary | ICD-10-CM | POA: Diagnosis not present

## 2019-12-05 ENCOUNTER — Ambulatory Visit (HOSPITAL_COMMUNITY): Payer: Medicare Other | Admitting: Psychiatry

## 2019-12-05 DIAGNOSIS — M533 Sacrococcygeal disorders, not elsewhere classified: Secondary | ICD-10-CM | POA: Diagnosis not present

## 2019-12-08 ENCOUNTER — Telehealth: Payer: Self-pay | Admitting: Family

## 2019-12-08 NOTE — Telephone Encounter (Signed)
Please let her know that we ordered her Cologuard too soon; will not be covered until the end of the year/ early next year. We will re-order at a later date and let her know.

## 2019-12-10 ENCOUNTER — Other Ambulatory Visit (HOSPITAL_COMMUNITY): Payer: Self-pay | Admitting: Psychiatry

## 2019-12-10 DIAGNOSIS — F4310 Post-traumatic stress disorder, unspecified: Secondary | ICD-10-CM

## 2019-12-11 NOTE — Telephone Encounter (Signed)
Called and left message for patient with info. Was there a certain time you want to order it so I can set a reminder to do so or have [place] order it?

## 2019-12-12 ENCOUNTER — Ambulatory Visit (INDEPENDENT_AMBULATORY_CARE_PROVIDER_SITE_OTHER): Payer: Medicare Other | Admitting: Psychiatry

## 2019-12-12 ENCOUNTER — Encounter (HOSPITAL_COMMUNITY): Payer: Self-pay | Admitting: Psychiatry

## 2019-12-12 ENCOUNTER — Other Ambulatory Visit: Payer: Self-pay

## 2019-12-12 DIAGNOSIS — F319 Bipolar disorder, unspecified: Secondary | ICD-10-CM

## 2019-12-12 DIAGNOSIS — F4310 Post-traumatic stress disorder, unspecified: Secondary | ICD-10-CM

## 2019-12-12 NOTE — Progress Notes (Signed)
Virtual Visit via Video Note  I connected with Shannon [name] on 12/12/19 at  2:30 PM EDT by a video enabled telemedicine application and verified that I am speaking with the correct person using two identifiers.  Location: Patient: Patient Home Provider: Home Office  I discussed the limitations of evaluation and management by telemedicine and the availability of in person appointments. The patient expressed understanding and agreed to proceed.  History of Present Illness: Bipolar 1 DO, PTSD, Anxiety  Treatment Plan Goals: 1)Shannon [name] would like to stop racing thoughts, through learning and implementing CBT techniques, such as thought stopping, distracting, positive self-talk, to journal, etc. 2)Shannon [name] would like to improve self-esteem and self-image by reducing negative self-talk, focusing on more rational, logical and realistic attributes of self vs distorted and emotional thinking. 3)Shannon [name] would like to "find who I was 5 years ago", noting that she would like to be happier, more confident, engaged in life and improved daily functioning.  Observations/Objective: Counselor met with Client for individual therapy via Webex. 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 Client notes that she recently discovered that she had not been taking her depression medication for a month, which explained her regression and decrease in functioning. Client states that she now back on medications and taking them as prescribed. Client notes "Gomez Cleverly" having an episode with the prescribing doctor, which she was embarrassed and an ashamed of for being unable to control self. Counselor processed thoughts, feelings and behaviors experienced, identifying better coping skills and ways to handle situation better in the future. Client presents with moderate  depression and moderate anxiety. Client denied suicidal ideation or self-harm behaviors. Counselor assessed daily functioning with client stating that she had not completed homework from last session to communicate and interact more with husband. Counselor and Client explored issues and barriers to their intimacy and fulfillment within the relationship. Client identified ways she can reconnect with husband, through increasing communication and cooking together for future meals to honor his cultures and customs. Counselor assessed work on cognitive distortions around her role and impact in her grand children. Client shared that she has been able to connect with grandchildren's pre-adoptive parents to start a relationship and to share more about there experiences and needs. Client expressed appreciation to be a greater part of their lives than she had anticipated. Client beginning work on self-forgiveness and self-compassion.      Assessment and Plan: Counselor will continue to meet with patient to address treatment plan goals. Patient will continue to follow recommendations of providers and implement skills learned in session.  Follow Up Instructions: Counselor will send information for next session via Webex.   The patient was advised to call back or seek an in-person evaluation if the symptoms worsen or if the condition fails to improve as anticipated.  I provided 59 minutes of non-face-to-face time during this encounter.   Lise Auer, LCSW

## 2019-12-29 NOTE — Telephone Encounter (Signed)
Patient not due for Cologuard yet.

## 2020-01-03 ENCOUNTER — Other Ambulatory Visit: Payer: Self-pay

## 2020-01-03 ENCOUNTER — Encounter (HOSPITAL_COMMUNITY): Payer: Self-pay | Admitting: Psychiatry

## 2020-01-03 ENCOUNTER — Ambulatory Visit (INDEPENDENT_AMBULATORY_CARE_PROVIDER_SITE_OTHER): Payer: Medicare Other | Admitting: Psychiatry

## 2020-01-03 DIAGNOSIS — F4310 Post-traumatic stress disorder, unspecified: Secondary | ICD-10-CM | POA: Diagnosis not present

## 2020-01-03 DIAGNOSIS — F319 Bipolar disorder, unspecified: Secondary | ICD-10-CM | POA: Diagnosis not present

## 2020-01-03 NOTE — Progress Notes (Signed)
Virtual Visit via Video Note  I connected with Shannon [name] on 01/03/20 at  2:30 PM EST by a video enabled telemedicine application and verified that I am speaking with the correct person using two identifiers.  Location: Patient: Patient Home Provider: Home Office  I discussed the limitations of evaluation and management by telemedicine and the availability of in person appointments. The patient expressed understanding and agreed to proceed.  History of Present Illness: Bipolar 1 DO, PTSD, Anxiety  Treatment Plan Goals: 1)Shannon [name] would like to stop racing thoughts, through learning and implementing CBT techniques, such as thought stopping, distracting, positive self-talk, to journal, etc. 2)Shannon [name] would like to improve self-esteem and self-image by reducing negative self-talk, focusing on more rational, logical and realistic attributes of self vs distorted and emotional thinking. 3)Shannon [name] would like to "find who I was 5 years ago", noting that she would like to be happier, more confident, engaged in life and improved daily functioning.  Observations/Objective: Counselor met with Client for individual therapy via Webex. 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 Client notes that she is having to make a difficult family decision that is causing increased anxiety. Client is balancing stress with preparing to visit her grandchildren who are doing well in their new adoptive home. Client presents with moderatedepression and moderateanxiety. Client denied suicidal ideation or self-harm behaviors. Counselor prompted client to share progress on goals with Counselor utilizing MI interventions to assess and prompt motivation to change. Client discussed trauma exposure, grief and loss issues and elements of daily functioning. Counselor and  Client discussed strategies in addressing current concerns using CBT skills. Client to report back application of skills.   Assessment and Plan: Counselor will continue to meet with patient to address treatment plan goals. Patient will continue to follow recommendations of providers and implement skills learned in session.  Follow Up Instructions: Counselor will send information for next session via Webex.   The patient was advised to call back or seek an in-person evaluation if the symptoms worsen or if the condition fails to improve as anticipated.  I provided 59 minutes of non-face-to-face time during this encounter.   Lise Auer, LCSW

## 2020-01-11 ENCOUNTER — Encounter (HOSPITAL_COMMUNITY): Payer: Self-pay | Admitting: Psychiatry

## 2020-01-11 ENCOUNTER — Telehealth (INDEPENDENT_AMBULATORY_CARE_PROVIDER_SITE_OTHER): Payer: Medicare Other | Admitting: Psychiatry

## 2020-01-11 ENCOUNTER — Other Ambulatory Visit: Payer: Self-pay

## 2020-01-11 VITALS — Wt 261.0 lb

## 2020-01-11 DIAGNOSIS — F609 Personality disorder, unspecified: Secondary | ICD-10-CM | POA: Diagnosis not present

## 2020-01-11 DIAGNOSIS — F4310 Post-traumatic stress disorder, unspecified: Secondary | ICD-10-CM

## 2020-01-11 DIAGNOSIS — F319 Bipolar disorder, unspecified: Secondary | ICD-10-CM | POA: Diagnosis not present

## 2020-01-11 MED ORDER — FLUOXETINE HCL 20 MG PO CAPS: 20.0000 mg | ORAL_CAPSULE | Freq: Every day | ORAL | 2 refills | Status: DC

## 2020-01-11 MED ORDER — CLONAZEPAM 0.5 MG PO TABS: 0.5000 mg | ORAL_TABLET | Freq: Three times a day (TID) | ORAL | 2 refills | Status: DC | PRN

## 2020-01-11 MED ORDER — TEMAZEPAM 30 MG PO CAPS: 30.0000 mg | ORAL_CAPSULE | Freq: Every evening | ORAL | 2 refills | Status: DC | PRN

## 2020-01-11 MED ORDER — QUETIAPINE FUMARATE 200 MG PO TABS: 200.0000 mg | ORAL_TABLET | Freq: Every day | ORAL | 2 refills | Status: DC
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
## 2020-02-05 ENCOUNTER — Other Ambulatory Visit: Payer: Self-pay

## 2020-02-05 ENCOUNTER — Encounter (HOSPITAL_COMMUNITY): Payer: Self-pay | Admitting: Psychiatry

## 2020-02-05 ENCOUNTER — Ambulatory Visit (INDEPENDENT_AMBULATORY_CARE_PROVIDER_SITE_OTHER): Payer: Medicare Other | Admitting: Psychiatry

## 2020-02-05 DIAGNOSIS — F319 Bipolar disorder, unspecified: Secondary | ICD-10-CM

## 2020-02-05 DIAGNOSIS — F4310 Post-traumatic stress disorder, unspecified: Secondary | ICD-10-CM | POA: Diagnosis not present

## 2020-02-05 NOTE — Progress Notes (Signed)
Virtual Visit via Video Note  I connected with Shannon [name] on 02/05/20 at  3:30 PM EST by a video enabled telemedicine application and verified that I am speaking with the correct person using two identifiers.  Location: Patient: Patient Home Provider: Home Office  I discussed the limitations of evaluation and management by telemedicine and the availability of in person appointments. The patient expressed understanding and agreed to proceed.  History of Present Illness: Bipolar 1 DO, PTSD, Anxiety  Treatment Plan Goals: 1)Shannon [name] would like to stop racing thoughts, through learning and implementing CBT techniques, such as thought stopping, distracting, positive self-talk, to journal, etc. 2)Shannon [name] would like to improve self-esteem and self-image by reducing negative self-talk, focusing on more rational, logical and realistic attributes of self vs distorted and emotional thinking. 3)Shannon [name] would like to "find who I was 5 years ago", noting that she would like to be happier, more confident, engaged in life and improved daily functioning.  Observations/Objective: Counselor met with Client for individual therapy via Webex. Counselor assessed MH symptoms and progress on treatment plan goals, with Client reporting that she has found relief from her guilt and shame related to care of grandchildren and that she is setting healthy boundaries with her siblings and mother that protect her mental health. Counselor processed the action steps taken on goals and how she is applying coping strategies learned in session. Client reports reduced depressive and anxiety symptoms through processing emotions, slowing down thoughts and responses and allowing time for self-care. Client presents with moderatedepression and moderateanxiety. Client denied suicidal ideation or self-harm behaviors.Counselor prompted client to share progress on goals with Counselor utilizing MI interventions to assess and  prompt motivation to change. Client discussed trauma exposure, grief and loss issues and elements of daily functioning. Counselor and Client discussed strategies in addressing current concerns using CBT skills. Client to report back application of skills.   Assessment and Plan: Counselor will continue to meet with patient to address treatment plan goals. Patient will continue to follow recommendations of providers and implement skills learned in session.  Follow Up Instructions: Counselor will send information for next session via Webex.   The patient was advised to call back or seek an in-person evaluation if the symptoms worsen or if the condition fails to improve as anticipated.  I provided 16mnutes of non-face-to-face time during this encounter.   BLise Auer LCSW

## 2020-02-07 ENCOUNTER — Other Ambulatory Visit: Payer: Self-pay

## 2020-02-07 ENCOUNTER — Telehealth: Payer: Self-pay | Admitting: Family

## 2020-02-07 DIAGNOSIS — Z1211 Encounter for screening for malignant neoplasm of colon: Secondary | ICD-10-CM

## 2020-02-07 NOTE — Telephone Encounter (Signed)
Order has been placed.

## 2020-02-07 NOTE — Telephone Encounter (Signed)
-----   Message from Marrian Salvage, [place] sent at 12/08/2019 12:39 PM EDT ----- Will need Cologuard after 01/24/2020

## 2020-02-26 ENCOUNTER — Other Ambulatory Visit (HOSPITAL_COMMUNITY): Payer: Self-pay | Admitting: Psychiatry

## 2020-02-26 ENCOUNTER — Ambulatory Visit (HOSPITAL_COMMUNITY): Payer: Medicare Other | Admitting: Psychiatry

## 2020-02-26 ENCOUNTER — Other Ambulatory Visit: Payer: Self-pay

## 2020-02-26 ENCOUNTER — Ambulatory Visit: Payer: Medicare Other | Admitting: Family

## 2020-02-26 DIAGNOSIS — F4310 Post-traumatic stress disorder, unspecified: Secondary | ICD-10-CM

## 2020-02-28 ENCOUNTER — Encounter (HOSPITAL_COMMUNITY): Payer: Self-pay

## 2020-03-08 ENCOUNTER — Ambulatory Visit (INDEPENDENT_AMBULATORY_CARE_PROVIDER_SITE_OTHER): Payer: Medicare Other | Admitting: Internal Medicine

## 2020-03-08 ENCOUNTER — Encounter: Payer: Self-pay | Admitting: Internal Medicine

## 2020-03-08 ENCOUNTER — Other Ambulatory Visit: Payer: Self-pay

## 2020-03-08 ENCOUNTER — Ambulatory Visit: Payer: Medicare Other | Admitting: Internal Medicine

## 2020-03-08 VITALS — BP 146/88 | HR 120 | Temp 98.2°F | Ht 64.0 in | Wt 277.0 lb

## 2020-03-08 DIAGNOSIS — R7309 Other abnormal glucose: Secondary | ICD-10-CM | POA: Diagnosis not present

## 2020-03-08 DIAGNOSIS — R21 Rash and other nonspecific skin eruption: Secondary | ICD-10-CM | POA: Diagnosis not present

## 2020-03-08 DIAGNOSIS — R221 Localized swelling, mass and lump, neck: Secondary | ICD-10-CM | POA: Diagnosis not present

## 2020-03-08 DIAGNOSIS — I10 Essential (primary) hypertension: Secondary | ICD-10-CM

## 2020-03-08 DIAGNOSIS — R000 Tachycardia, unspecified: Secondary | ICD-10-CM | POA: Diagnosis not present

## 2020-03-08 DIAGNOSIS — M79674 Pain in right toe(s): Secondary | ICD-10-CM | POA: Diagnosis not present

## 2020-03-08 DIAGNOSIS — R739 Hyperglycemia, unspecified: Secondary | ICD-10-CM | POA: Diagnosis not present

## 2020-03-08 DIAGNOSIS — M79675 Pain in left toe(s): Secondary | ICD-10-CM | POA: Diagnosis not present

## 2020-03-08 LAB — POCT GLYCOSYLATED HEMOGLOBIN (HGB A1C): Hemoglobin A1C: 6.1 % — AB (ref 4.0–5.6)

## 2020-03-08 MED ORDER — GABAPENTIN 100 MG PO CAPS: 100.0000 mg | ORAL_CAPSULE | Freq: Three times a day (TID) | ORAL | 3 refills | Status: DC

## 2020-03-08 MED ORDER — NYSTATIN 100000 UNIT/GM EX POWD: CUTANEOUS | 2 refills | Status: DC

## 2020-03-08 MED ORDER — MELOXICAM 15 MG PO TABS: 15.0000 mg | ORAL_TABLET | Freq: Every day | ORAL | 2 refills | Status: DC | PRN

## 2020-03-08 NOTE — Patient Instructions (Signed)
Your A1c was ok today  Please take all new medication as prescribed - the gabapentin (lower dose) three times per day  Please take all new medication as prescribed - the meloxicam 15 mg once per day (anti-inflammatory)  Please take all new medication as prescribed - the nystatin powder for groin fungus rash  Please continue all other medications as before, and refills have been done if requested.  Please have the pharmacy call with any other refills you may need.  Please continue your efforts at being more active, low cholesterol diet, and weight control.  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

## 2020-03-08 NOTE — Progress Notes (Signed)
Established Patient Office Visit  Subjective:  Patient ID: Shannon [name], female    DOB: [birth year]  Age: 55 y.o. MRN: [id number]      Chief Complaint: here with c/o bilat distal toe pain, rash to groin, left neck mass, hyperglycemia, and tachycardia       HPI:  Shannon [name] is a 55 y.o. female here with c/o bilateral toe pain x 2 mo, worse now mod to severe especially to get to sleep at night, but no red, swelling, ulcer or other significant visible abnormality; does have a groin rash bilateral with itchiness where the skin folds overlap' also has > 6 mo intermittent left submandibular mass that seems to come and go bigger and smaller, without fever, trauma, wt loss or decreased appetiie.   Pt denies polydipsia, polyuria.  Has mild tachycardia today but Pt denies chest pain, increased sob or doe, wheezing, orthopnea, PND, increased LE swelling, palpitations, dizziness or syncope. And declines ecg or referral for now  BP has been elevated recently as well, not checking at home often.  Pt very worried about DM neuropathy though no hx of DM.  Very tense and nervous in demeanor today  Wt Readings from Last 3 Encounters:  03/08/20 277 lb (125.6 kg)  11/22/19 261 lb (118.4 kg)  07/11/19 248 lb 3.2 oz (112.6 kg)   BP Readings from Last 3 Encounters:  03/08/20 (!) 146/88  11/22/19 (!) 144/84  07/11/19 (!) 150/90    Past Medical History:  Diagnosis Date  . Allergic rhinitis   . Allergic rhinitis 01/25/2009       . ANEMIA-IRON DEFICIENCY 01/25/2009  . Anxiety   . Backache 01/25/2009   Qualifier: Diagnosis of  By: Jenny Reichmann MD, Hunt Oris   . Bipolar 1 disorder ([place])   . Chronic pain syndrome   . Complete rotator cuff tear 07/21/2013  . Complete tear of right rotator cuff 07/21/2013  . Contact lens/glasses fitting   . [place] DISEASE, LUMBAR 01/25/2009  . Essential hypertension 25/09/5275   On Bystolic   . GAD (generalized anxiety disorder) 12/28/2013  . GERD (gastroesophageal reflux disease)   .  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  . Headache, acute 12/20/2013  . Hx of laparoscopic gastric banding 09/03/2010   Surgery date: 07/17/10   . HYPERLIPIDEMIA 01/25/2009  . Hyperlipidemia with target LDL less than 130 01/25/2009  . HYPERTENSION 01/25/2009  . Hypokalemia, inadequate intake 09/01/2011  . Insomnia 04/04/2011  . INSOMNIA-SLEEP DISORDER-UNSPEC 04/30/2009   Qualifier: Diagnosis of  By: Jenny Reichmann MD, Hunt Oris   . Iron deficiency anemia 01/25/2009       . Labyrinthitis 02/19/2014  . Leiomyoma of uterus, unspecified 08/13/2008   Overview:  Leiomyoma Of The Uterus  10/1 IMO update  . Localized edema 06/12/2015  . MANIC DEPRESSIVE ILLNESS 01/25/2009   pt is unsue if this is her specifc dx  . Mixed bipolar I disorder ([place]) 07/12/2012  . Morbid obesity ([place]) 01/25/2009  . Night sweats   . Other abnormal glucose 10/26/2012  . Palpitations 10/25/2013  . PEPTIC ULCER DISEASE, HELICOBACTER PYLORI POSITIVE 10/03/2009  . Peripheral edema 06/25/2015  . Pernicious anemia 03/28/2012  . Piriformis syndrome of both sides 09/02/2018  . PUD (peptic ulcer disease) 10/03/2009       . Right shoulder pain 05/03/2013   Dg Shoulder Right  05/03/2013   CLINICAL DATA Pain.  EXAM RIGHT SHOULDER - 2+ VIEW  COMPARISON None.  FINDINGS Acromioclavicular and glenohumeral degenerative change present. Questionable calcific density noted in  the region of the supraspinatus space, possibly representing calcific supraspinatus tendinitis. This could represent a sclerotic density in the acromion. MRI of the right shoulder suggest  . Sleep apnea 08/12/2016  . SMOKER 01/25/2009   Qualifier: Diagnosis of  By: Jonny Ruiz MD, Len Blalock   . Subacromial bursitis 05/17/2013  . SUBSTANCE ABUSE 11/06/2009       . Thiamin deficiency 10/30/2012  . Tobacco abuse 06/09/2010  . Visual disturbance 05/03/2013  . Vitamin D deficiency 10/27/2012   Past Surgical History:  Procedure Laterality Date  . ABDOMINAL HYSTERECTOMY    . BLADDER SURGERY     s/p with ?diverticulitis  .  HAND TENDON SURGERY  1991   s/p-Right-index and middle  . LAPAROSCOPIC GASTRIC BAND REMOVAL WITH LAPAROSCOPIC GASTRIC SLEEVE RESECTION N/A 08/17/2016   Procedure: LAPAROSCOPIC GASTRIC BAND REMOVAL WITH LAPAROSCOPIC GASTRIC SLEEVE RESECTION WITH UPPER ENDO;  Surgeon: Ovidio Kin, MD;  Location: [facility];  Service: General;  Laterality: N/A;  . LAPAROSCOPIC GASTRIC BANDING  07/14/10  . SHOULDER ARTHROSCOPY Right 07/21/2013   Procedure: RIGHT ARTHROSCOPY SHOULDER DEBRIDMENT EXTENTSIVE,ARTHROSCOPIC REMOVE LOOSE FOREIGN BODY, BICEPS TENOLYSIS ;  Surgeon: Eulas Post, MD;  Location: [facility];  Service: Orthopedics;  Laterality: Right;  . TUBAL LIGATION      reports that she has been smoking cigarettes. She has a 5.00 pack-year smoking history. She has never used smokeless tobacco. She reports that she does not drink alcohol and does not use drugs. family history includes ADD / ADHD in her son and another family member; Alcohol abuse in her father; Asthma in her mother and sister; Cirrhosis in her father; Diabetes in her father, maternal grandmother, and other family members; Hypertension in her brother, father, mother, paternal aunt, and sister; Stroke in her father. Allergies  Allergen Reactions  . Lamictal [Lamotrigine] Rash   Current Outpatient Medications on File Prior to Visit  Medication Sig Dispense Refill  . amLODipine (NORVASC) 5 MG tablet Take 1 tablet (5 mg total) by mouth daily. 30 tablet 11  . atorvastatin (LIPITOR) 20 MG tablet Take 1 tablet (20 mg total) by mouth daily. 30 tablet 11  . BELSOMRA 15 MG TABS Take 1 tablet by mouth at bedtime as needed.    . clonazePAM (KLONOPIN) 0.5 MG tablet Take 1 tablet (0.5 mg total) by mouth 3 (three) times daily as needed for anxiety. 90 tablet 2  . FLUoxetine (PROZAC) 20 MG capsule Take 1 capsule (20 mg total) by mouth daily. 30 capsule 2  . Multiple Vitamin (MULTIVITAMIN) tablet Take 1 tablet by mouth daily. Reported on 09/09/2015     . pantoprazole (PROTONIX) 40 MG tablet Take 1 tablet (40 mg total) by mouth daily. 30 tablet 11  . QUEtiapine (SEROQUEL) 200 MG tablet Take 1 tablet (200 mg total) by mouth at bedtime. 30 tablet 2  . thiamine ([facility] B-1) 100 MG tablet Take 1 tablet (100 mg total) by mouth daily. 30 tablet 3  . Vitamin D, Ergocalciferol, (DRISDOL) 1.25 MG (50000 UNIT) CAPS capsule Take 1 capsule (50,000 Units total) by mouth every 7 (seven) days. 12 capsule 1  . temazepam (RESTORIL) 30 MG capsule Take 1 capsule (30 mg total) by mouth at bedtime as needed for sleep. 30 capsule 2   No current facility-administered medications on file prior to visit.        ROS:  All others reviewed and negative.  Objective        PE:  BP (!) 146/88   Pulse Marland Kitchen)  120   Temp 98.2 F (36.8 C) (Oral)   Ht 5\' 4"  (1.626 m)   Wt 277 lb (125.6 kg)   SpO2 94%   BMI 47.55 kg/m                 Constitutional: Pt appears in NAD               HENT: Head: NCAT.                Right Ear: External ear normal.                 Left Ear: External ear normal.                Eyes: . Pupils are equal, round, and reactive to light. Conjunctivae and EOM are normal               Nose: without d/c or deformity               Neck: Neck supple. Gross normal ROM               Cardiovascular: Normal rate and regular rhythm.                 Pulmonary/Chest: Effort normal and breath sounds without rales or wheezing.                Abd:  Soft, NT, ND, + BS, no organomegaly               Neurological: Pt is alert. At baseline orientation, motor grossly intact               Skin: Skin is warm. No rashes, no other new lesions, LE edema - none               Psychiatric: Pt behavior is normal without agitation   Assessment/Plan:  Shannon [name] is a 55 y.o. Black or African American [2] female with  has a past medical history of Allergic rhinitis, Allergic rhinitis (01/25/2009), ANEMIA-IRON DEFICIENCY (01/25/2009), Anxiety, Backache (01/25/2009), Bipolar  1 disorder ([place]), Chronic pain syndrome, Complete rotator cuff tear (07/21/2013), Complete tear of right rotator cuff (07/21/2013), Contact lens/glasses fitting, [place]C DISEASE, LUMBAR (01/25/2009), Essential hypertension (01/25/2009), GAD (generalized anxiety disorder) (12/28/2013), GERD (gastroesophageal reflux disease), Glenohumeral arthritis (06/28/2013), Headache, acute (12/20/2013), laparoscopic gastric banding (09/03/2010), HYPERLIPIDEMIA (01/25/2009), Hyperlipidemia with target LDL less than 130 (01/25/2009), HYPERTENSION (01/25/2009), Hypokalemia, inadequate intake (09/01/2011), Insomnia (04/04/2011), INSOMNIA-SLEEP DISORDER-UNSPEC (04/30/2009), Iron deficiency anemia (01/25/2009), Labyrinthitis (02/19/2014), Leiomyoma of uterus, unspecified (08/13/2008), Localized edema (06/12/2015), MANIC DEPRESSIVE ILLNESS (01/25/2009), Mixed bipolar I disorder ([place]) (07/12/2012), Morbid obesity ([place]) (01/25/2009), Night sweats, Other abnormal glucose (10/26/2012), Palpitations (10/25/2013), PEPTIC ULCER DISEASE, HELICOBACTER PYLORI POSITIVE (10/03/2009), Peripheral edema (06/25/2015), Pernicious anemia (03/28/2012), Piriformis syndrome of both sides (09/02/2018), PUD (peptic ulcer disease) (10/03/2009), Right shoulder pain (05/03/2013), Sleep apnea (08/12/2016), SMOKER (01/25/2009), Subacromial bursitis (05/17/2013), SUBSTANCE ABUSE (11/06/2009), Thiamin deficiency (10/30/2012), Tobacco abuse (06/09/2010), Visual disturbance (05/03/2013), and Vitamin D deficiency (10/27/2012).   Assessment Plan  See problem oriented assessment and plan Labs reviewed for each problem: Lab Results  Component Value Date   WBC 7.6 11/22/2019   HGB 12.3 11/22/2019   HCT 37.1 11/22/2019   PLT 318 11/22/2019   GLUCOSE 79 11/22/2019   CHOL 262 (H) 11/22/2019   TRIG 121 11/22/2019   HDL 57 11/22/2019   LDLDIRECT 195.6 10/26/2012   LDLCALC 180 (H) 11/22/2019   ALT 10 11/22/2019   AST  11 11/22/2019   NA 141 11/22/2019   K 4.4 11/22/2019   CL 104 11/22/2019   CREATININE  0.71 11/22/2019   BUN 9 11/22/2019   CO2 28 11/22/2019   TSH 2.41 09/01/2018   HGBA1C 6.1 (A) 03/08/2020    Micro: none  Cardiac tracings I have personally interpreted today:  none  Pertinent Radiological findings (summarize): none     Health Maintenance Due  Topic Date Due  . HIV Screening  Never done  . TETANUS/TDAP  02/28/2019  . INFLUENZA VACCINE  09/24/2019  . COVID-19 Vaccine (2 - Pfizer 3-dose series) 12/11/2019  . Fecal DNA (Cologuard)  01/10/2020    There are no preventive care reminders to display for this patient.  Lab Results  Component Value Date   TSH 2.41 09/01/2018   Lab Results  Component Value Date   WBC 7.6 11/22/2019   HGB 12.3 11/22/2019   HCT 37.1 11/22/2019   MCV 95.1 11/22/2019   PLT 318 11/22/2019   Lab Results  Component Value Date   NA 141 11/22/2019   K 4.4 11/22/2019   CO2 28 11/22/2019   GLUCOSE 79 11/22/2019   BUN 9 11/22/2019   CREATININE 0.71 11/22/2019   BILITOT 0.7 11/22/2019   ALKPHOS 97 11/08/2018   AST 11 11/22/2019   ALT 10 11/22/2019   PROT 7.1 11/22/2019   ALBUMIN 3.8 11/08/2018   CALCIUM 8.6 11/22/2019   ANIONGAP 6 08/12/2016   GFR 107.13 11/08/2018   Lab Results  Component Value Date   CHOL 262 (H) 11/22/2019   Lab Results  Component Value Date   HDL 57 11/22/2019   Lab Results  Component Value Date   LDLCALC 180 (H) 11/22/2019   Lab Results  Component Value Date   TRIG 121 11/22/2019   Lab Results  Component Value Date   CHOLHDL 4.6 11/22/2019   Lab Results  Component Value Date   HGBA1C 6.1 (A) 03/08/2020      Assessment & Plan:   Problem List Items Addressed This Visit      Medium   Toe pain, bilateral - Primary    Pt quite beisde herself with concern today, exam benign, for gabapentin 100 tid trial, mobic prn, and consider check b12 and/or NCS/EMG - declines today      Tachycardia    Regular by exam, declines ecg, for fluids, rest, and avoid stress      Other abnormal glucose     Lab Results  Component Value Date   HGBA1C 6.1 (A) 03/08/2020  stable, for f/u a1c, cont diet, wt control      Essential hypertension    Mild elevated, to continue amlodipine 5, declines futher change in med, f/u bp at home and next visit        Low   Rash    Rash c/w intertrigo, for nystatin powder asd,  to f/u any worsening symptoms or concerns      Neck mass    Exam benign today suspect submandibular salivary gland involved, consider ENT for worsening       Other Visit Diagnoses    Hyperglycemia       Relevant Orders   POCT glycosylated hemoglobin (Hb A1C) (Completed)     POCT glycosylated hemoglobin (Hb A1C) Order: [id number]  Status: Final result   Visible to patient: Yes (seen)   Dx: Hyperglycemia   0 Result Notes  Component Ref Range & Units 09:31 5 yr ago 6 yr ago 7 yr ago 10  yr ago  Hemoglobin A1C 4.0 - 5.6 % 6.1Abnormal  5.6 R, CM  5.6 R, CM  5.2 R, CM  5.7 R,       Meds ordered this encounter  Medications  . nystatin (MYCOSTATIN/NYSTOP) powder    Sig: Use as directed twice per day as needed to affected area    Dispense:  45 g    Refill:  2  . gabapentin (NEURONTIN) 100 MG capsule    Sig: Take 1 capsule (100 mg total) by mouth 3 (three) times daily.    Dispense:  90 capsule    Refill:  3  . meloxicam (MOBIC) 15 MG tablet    Sig: Take 1 tablet (15 mg total) by mouth daily as needed for pain.    Dispense:  30 tablet    Refill:  2    Follow-up: Return if symptoms worsen or fail to improve.   Cathlean Cower, MD 03/08/2020 8:46 AM [facility]

## 2020-03-09 ENCOUNTER — Encounter: Payer: Self-pay | Admitting: Internal Medicine

## 2020-03-09 DIAGNOSIS — M79675 Pain in left toe(s): Secondary | ICD-10-CM | POA: Insufficient documentation

## 2020-03-09 DIAGNOSIS — M79674 Pain in right toe(s): Secondary | ICD-10-CM | POA: Insufficient documentation

## 2020-03-09 DIAGNOSIS — R221 Localized swelling, mass and lump, neck: Secondary | ICD-10-CM | POA: Insufficient documentation

## 2020-03-09 DIAGNOSIS — R21 Rash and other nonspecific skin eruption: Secondary | ICD-10-CM | POA: Insufficient documentation

## 2020-03-09 DIAGNOSIS — R000 Tachycardia, unspecified: Secondary | ICD-10-CM | POA: Insufficient documentation

## 2020-03-09 NOTE — Assessment & Plan Note (Signed)
Exam benign today suspect submandibular salivary gland involved, consider ENT for worsening

## 2020-03-09 NOTE — Assessment & Plan Note (Signed)
Pt quite beisde herself with concern today, exam benign, for gabapentin 100 tid trial, mobic prn, and consider check b12 and/or NCS/EMG - declines today

## 2020-03-09 NOTE — Assessment & Plan Note (Signed)
Mild elevated, to continue amlodipine 5, declines futher change in med, f/u bp at home and next visit

## 2020-03-09 NOTE — Assessment & Plan Note (Signed)
Rash c/w intertrigo, for nystatin powder asd,  to f/u any worsening symptoms or concerns

## 2020-03-09 NOTE — Assessment & Plan Note (Signed)
Lab Results  Component Value Date   HGBA1C 6.1 (A) 03/08/2020  stable, for f/u a1c, cont diet, wt control

## 2020-03-09 NOTE — Assessment & Plan Note (Signed)
Regular by exam, declines ecg, for fluids, rest, and avoid stress

## 2020-03-11 ENCOUNTER — Other Ambulatory Visit: Payer: Self-pay

## 2020-03-11 ENCOUNTER — Ambulatory Visit (HOSPITAL_COMMUNITY): Payer: Medicare Other | Admitting: Psychiatry

## 2020-03-11 ENCOUNTER — Telehealth: Payer: Self-pay

## 2020-03-11 NOTE — Telephone Encounter (Signed)
Received a faxed request from [facility] for refill of Vit B-1 100mg 

## 2020-03-12 ENCOUNTER — Ambulatory Visit: Payer: Medicare Other | Admitting: Family

## 2020-03-12 ENCOUNTER — Other Ambulatory Visit: Payer: Self-pay | Admitting: Family

## 2020-03-12 DIAGNOSIS — E519 Thiamine deficiency, unspecified: Secondary | ICD-10-CM

## 2020-03-12 MED ORDER — THIAMINE HCL 100 MG PO TABS: 100.0000 mg | ORAL_TABLET | Freq: Every day | ORAL | 3 refills | Status: DC

## 2020-03-13 ENCOUNTER — Encounter: Payer: Self-pay | Admitting: Family

## 2020-03-26 ENCOUNTER — Telehealth (INDEPENDENT_AMBULATORY_CARE_PROVIDER_SITE_OTHER): Payer: Medicare Other | Admitting: Family

## 2020-03-26 ENCOUNTER — Other Ambulatory Visit: Payer: Self-pay

## 2020-03-26 ENCOUNTER — Ambulatory Visit (INDEPENDENT_AMBULATORY_CARE_PROVIDER_SITE_OTHER): Payer: Medicare Other | Admitting: Psychiatry

## 2020-03-26 DIAGNOSIS — F4310 Post-traumatic stress disorder, unspecified: Secondary | ICD-10-CM

## 2020-03-26 DIAGNOSIS — R202 Paresthesia of skin: Secondary | ICD-10-CM

## 2020-03-26 DIAGNOSIS — M791 Myalgia, unspecified site: Secondary | ICD-10-CM | POA: Diagnosis not present

## 2020-03-26 DIAGNOSIS — R200 Anesthesia of skin: Secondary | ICD-10-CM

## 2020-03-26 NOTE — Progress Notes (Signed)
Shannon [name] is a 55 y.o. female with the following history as recorded in EpicCare:  Patient Active Problem List   Diagnosis Date Noted  . Tachycardia 03/09/2020  . Rash 03/09/2020  . Neck mass 03/09/2020  . Toe pain, bilateral 03/09/2020  . Piriformis syndrome of both sides 09/02/2018  . Routine general medical examination at a health care facility 12/22/2016  . Sleep apnea 08/12/2016  . Colon cancer screening 01/24/2015  . GERD (gastroesophageal reflux disease) 12/26/2014  . GAD (generalized anxiety disorder) 12/28/2013  . Abnormal vaginal Pap smear 10/25/2013  . Thiamin deficiency 10/30/2012  . Vitamin D deficiency 10/27/2012  . Other abnormal glucose 10/26/2012  . Mixed bipolar I disorder ([place]) 07/12/2012  . Hypokalemia, inadequate intake 09/01/2011  . Insomnia 04/04/2011  . Visit for screening mammogram 02/26/2011  . Hx of laparoscopic gastric banding 09/03/2010  . Tobacco abuse 06/09/2010  . Hyperlipidemia with target LDL less than 130 01/25/2009  . Morbid obesity ([place]) 01/25/2009  . Iron deficiency anemia 01/25/2009  . Essential hypertension 01/25/2009  . Allergic rhinitis 01/25/2009  . [place] DISEASE, LUMBAR 01/25/2009  . Backache 01/25/2009  . Leiomyoma of uterus, unspecified 08/13/2008    Current Outpatient Medications  Medication Sig Dispense Refill  . amLODipine (NORVASC) 5 MG tablet Take 1 tablet (5 mg total) by mouth daily. 30 tablet 11  . atorvastatin (LIPITOR) 20 MG tablet Take 1 tablet (20 mg total) by mouth daily. (Patient not taking: Reported on 03/26/2020) 30 tablet 11  . BELSOMRA 15 MG TABS Take 1 tablet by mouth at bedtime as needed.    . clonazePAM (KLONOPIN) 0.5 MG tablet Take 1 tablet (0.5 mg total) by mouth 3 (three) times daily as needed for anxiety. 90 tablet 2  . FLUoxetine (PROZAC) 20 MG capsule Take 1 capsule (20 mg total) by mouth daily. 30 capsule 2  . gabapentin (NEURONTIN) 100 MG capsule Take 1 capsule (100 mg total) by mouth 3 (three)  times daily. 90 capsule 3  . meloxicam (MOBIC) 15 MG tablet Take 1 tablet (15 mg total) by mouth daily as needed for pain. 30 tablet 2  . Multiple Vitamin (MULTIVITAMIN) tablet Take 1 tablet by mouth daily. Reported on 09/09/2015    . nystatin (MYCOSTATIN/NYSTOP) powder Use as directed twice per day as needed to affected area 45 g 2  . pantoprazole (PROTONIX) 40 MG tablet Take 1 tablet (40 mg total) by mouth daily. 30 tablet 11  . QUEtiapine (SEROQUEL) 200 MG tablet Take 1 tablet (200 mg total) by mouth at bedtime. 30 tablet 2  . temazepam (RESTORIL) 30 MG capsule Take 1 capsule (30 mg total) by mouth at bedtime as needed for sleep. 30 capsule 2  . thiamine ([facility] B-1) 100 MG tablet Take 1 tablet (100 mg total) by mouth daily. 30 tablet 3  . Vitamin D, Ergocalciferol, (DRISDOL) 1.25 MG (50000 UNIT) CAPS capsule Take 1 capsule (50,000 Units total) by mouth every 7 (seven) days. 12 capsule 1   No current facility-administered medications for this visit.    Allergies: Lamictal [lamotrigine]  Past Medical History:  Diagnosis Date  . Allergic rhinitis   . Allergic rhinitis 01/25/2009       . ANEMIA-IRON DEFICIENCY 01/25/2009  . Anxiety   . Backache 01/25/2009   Qualifier: Diagnosis of  By: Jenny Reichmann MD, Hunt Oris   . Bipolar 1 disorder ([place])   . Chronic pain syndrome   . Complete rotator cuff tear 07/21/2013  . Complete tear of right rotator cuff  07/21/2013  . Contact lens/glasses fitting   . [place] DISEASE, LUMBAR 01/25/2009  . Essential hypertension 35/0/0938   On Bystolic   . GAD (generalized anxiety disorder) 12/28/2013  . GERD (gastroesophageal reflux disease)   . 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  . Headache, acute 12/20/2013  . Hx of laparoscopic gastric banding 09/03/2010   Surgery date: 07/17/10   . HYPERLIPIDEMIA 01/25/2009  . Hyperlipidemia with target LDL less than 130 01/25/2009  . HYPERTENSION 01/25/2009  . Hypokalemia, inadequate intake 09/01/2011  . Insomnia 04/04/2011  . INSOMNIA-SLEEP  DISORDER-UNSPEC 04/30/2009   Qualifier: Diagnosis of  By: Jenny Reichmann MD, Hunt Oris   . Iron deficiency anemia 01/25/2009       . Labyrinthitis 02/19/2014  . Leiomyoma of uterus, unspecified 08/13/2008   Overview:  Leiomyoma Of The Uterus  10/1 IMO update  . Localized edema 06/12/2015  . MANIC DEPRESSIVE ILLNESS 01/25/2009   pt is unsue if this is her specifc dx  . Mixed bipolar I disorder ([place]) 07/12/2012  . Morbid obesity ([place]) 01/25/2009  . Night sweats   . Other abnormal glucose 10/26/2012  . Palpitations 10/25/2013  . PEPTIC ULCER DISEASE, HELICOBACTER PYLORI POSITIVE 10/03/2009  . Peripheral edema 06/25/2015  . Pernicious anemia 03/28/2012  . Piriformis syndrome of both sides 09/02/2018  . PUD (peptic ulcer disease) 10/03/2009       . Right shoulder pain 05/03/2013   Dg Shoulder Right  05/03/2013   CLINICAL DATA Pain.  EXAM RIGHT SHOULDER - 2+ VIEW  COMPARISON None.  FINDINGS Acromioclavicular and glenohumeral degenerative change present. Questionable calcific density noted in the region of the supraspinatus space, possibly representing calcific supraspinatus tendinitis. This could represent a sclerotic density in the acromion. MRI of the right shoulder suggest  . Sleep apnea 08/12/2016  . SMOKER 01/25/2009   Qualifier: Diagnosis of  By: Jenny Reichmann MD, Hunt Oris   . Subacromial bursitis 05/17/2013  . SUBSTANCE ABUSE 11/06/2009       . Thiamin deficiency 10/30/2012  . Tobacco abuse 06/09/2010  . Visual disturbance 05/03/2013  . Vitamin D deficiency 10/27/2012    Past Surgical History:  Procedure Laterality Date  . ABDOMINAL HYSTERECTOMY    . BLADDER SURGERY     s/p with ?diverticulitis  . HAND TENDON SURGERY  1991   s/p-Right-index and middle  . LAPAROSCOPIC GASTRIC BAND REMOVAL WITH LAPAROSCOPIC GASTRIC SLEEVE RESECTION N/A 08/17/2016   Procedure: LAPAROSCOPIC GASTRIC BAND REMOVAL WITH LAPAROSCOPIC GASTRIC SLEEVE RESECTION WITH UPPER ENDO;  Surgeon: Alphonsa Overall, MD;  Location: [facility];  Service: General;   Laterality: N/A;  . LAPAROSCOPIC GASTRIC BANDING  07/14/10  . SHOULDER ARTHROSCOPY Right 07/21/2013   Procedure: RIGHT ARTHROSCOPY SHOULDER DEBRIDMENT EXTENTSIVE,ARTHROSCOPIC REMOVE LOOSE FOREIGN BODY, BICEPS TENOLYSIS ;  Surgeon: Johnny Bridge, MD;  Location: [place];  Service: Orthopedics;  Laterality: Right;  . TUBAL LIGATION      Family History  Problem Relation Age of Onset  . Hypertension Mother   . Asthma Mother   . Stroke Father   . Cirrhosis Father        ETOH  . Hypertension Father   . Diabetes Father   . Alcohol abuse Father   . Hypertension Sister   . Asthma Sister   . Hypertension Brother   . ADD / ADHD Son   . Hypertension Paternal Aunt   . Diabetes Maternal Grandmother   . ADD / ADHD Other        3 nephews, also emotional issues  . Diabetes Other  Uncle  . Diabetes Other        Aunt  . Suicidality Neg Hx     Social History   Tobacco Use  . Smoking status: Current Every Day Smoker    Packs/day: 0.25    Years: 20.00    Pack years: 5.00    Types: Cigarettes  . Smokeless tobacco: Never Used  . Tobacco comment: States has cut back to 5-7 a day and working on this.   Substance Use Topics  . Alcohol use: No    Alcohol/week: 0.0 standard drinks    Subjective:    I connected with [name] on 03/26/20 at 11:00 AM EST by a video enabled telemedicine application and verified that I am speaking with the correct person using two identifiers.   I discussed the limitations of evaluation and management by telemedicine and the availability of in person appointments. The patient expressed understanding and agreed to proceed.  Was seen in office on 1/14 with concerns for "burning in her toes" and concerns for being diabetic; Hgba1c at that time was 6.1; given trial of Gabapentin and Mobic but notes that symptoms are still present; feels "burning in the bottom of her feet." Known history of B1 deficiency- is taking prescriptive thiamine;  notes she is not currently taking her cholesterol medication- when I asked her about the medicine, she said her cholesterol medication started 'with an e." She notes she did not feel like she wanted to come to the office today and asked for the virtual visit yesterday.     Objective:  There were no vitals filed for this visit.  General: Well developed, well nourished, in no acute distress  Head: Normocephalic and atraumatic  Lungs: Respirations unlabored;  Neurologic: Alert and oriented; speech intact; face symmetrical; moves all extremities well;   Assessment:  1. Numbness and tingling of both lower extremities   2. Myalgia     Plan:  ? Vitamin deficiency or possible reaction to Atorvastatin- this was written as a new prescription for her in November.  Patient agrees to update [facility] at [facility] tomorrow; she notes she is not taking a cholesterol medication but was confused about the name of her cholesterol medication when I reviewed her medications with her; will need her to look through her medications and if she is taking Atorvastatin, she will need to hold this; follow-up to be determined after labs are back;   No follow-ups on file.  Orders Placed This Encounter  Procedures  . CBC with Differential/Platelet    Standing Status:   Future    Standing Expiration Date:   03/26/2021  . Comp Met (CMET)    Standing Status:   Future    Standing Expiration Date:   03/26/2021  . TSH    Standing Status:   Future    Standing Expiration Date:   03/26/2021  . Vitamin B12    Standing Status:   Future    Standing Expiration Date:   03/26/2021  . Vitamin B1    Standing Status:   Future    Standing Expiration Date:   03/26/2021  . Magnesium    Standing Status:   Future    Standing Expiration Date:   03/26/2021  . CK    Standing Status:   Future    Standing Expiration Date:   03/26/2021    Requested Prescriptions    No prescriptions requested or ordered in this encounter

## 2020-03-27 ENCOUNTER — Encounter (HOSPITAL_COMMUNITY): Payer: Self-pay | Admitting: Psychiatry

## 2020-03-27 NOTE — Progress Notes (Signed)
Virtual Visit via Video Note  I connected with [name] on 03/26/2020 at  4:30 PM EST by a video enabled telemedicine application and verified that I am speaking with the correct person using two identifiers.  Location: Patient: Patient Home Provider: Home Office  I discussed the limitations of evaluation and management by telemedicine and the availability of in person appointments. The patient expressed understanding and agreed to proceed.  History of Present Illness: Bipolar 1 DO, PTSD, Anxiety  Treatment Plan Goals: 1)Janaki would like to stop racing thoughts, through learning and implementing CBT techniques, such as thought stopping, distracting, positive self-talk, to journal, etc. 2)Demiana would like to improve self-esteem and self-image by reducing negative self-talk, focusing on more rational, logical and realistic attributes of self vs distorted and emotional thinking. 3)Zosia would like to "find who I was 5 years ago", noting that she would like to be happier, more confident, engaged in life and improved daily functioning.  Observations/Objective: Counselor met with Client for individual therapy via Webex. Counselor assessed MH symptoms and progress on treatment plan goals, with Client reporting that she has actively been opening up more about [place] with friends and family, noting positive outcomes and increased support. Client presents with milddepression and moderateanxiety. Client denied suicidal ideation or self-harm behaviors.  Counselor prompted client to share progress on goals with Counselor utilizing MI interventions to assess and prompt motivation to change. Client discussed work on healing from childhood traumas through processing feelings with support system and family members. Client identifies that she is setting healthier boundaries with others. Client improving ADL through bathing 3-5 times a week, leaving home to run errands, attending religious  events and attending doctors appointments. Client reports decrease in dissociative episodes to less than 2 per month. Counselor and Client discussed plan to meet in person and more frequently in future sessions to process childhood trauma history.  Counselor and Client discussed strategies in addressing current concerns using CBT skills. Client to report back application of skills and progress towards writing book about life experiences.  Assessment and Plan: Counselor will continue to meet with patient to address treatment plan goals. Patient will continue to follow recommendations of providers and implement skills learned in session.  Follow Up Instructions: Counselor will send information for next session via Webex.   The patient was advised to call back or seek an in-person evaluation if the symptoms worsen or if the condition fails to improve as anticipated.  I provided 38mnutes of non-face-to-face time during this encounter.   BLise Auer LCSW

## 2020-04-02 ENCOUNTER — Telehealth: Payer: Self-pay | Admitting: Family

## 2020-04-02 NOTE — Telephone Encounter (Signed)
LVM for pt to rtn my call to schedule AWV with [facility]. Please schedule AWV if pt calls the office  

## 2020-04-04 ENCOUNTER — Ambulatory Visit (INDEPENDENT_AMBULATORY_CARE_PROVIDER_SITE_OTHER): Payer: Medicare Other | Admitting: Psychiatry

## 2020-04-04 ENCOUNTER — Other Ambulatory Visit: Payer: Self-pay

## 2020-04-04 ENCOUNTER — Encounter (HOSPITAL_COMMUNITY): Payer: Self-pay | Admitting: Psychiatry

## 2020-04-04 DIAGNOSIS — F319 Bipolar disorder, unspecified: Secondary | ICD-10-CM | POA: Diagnosis not present

## 2020-04-04 DIAGNOSIS — F4310 Post-traumatic stress disorder, unspecified: Secondary | ICD-10-CM | POA: Diagnosis not present

## 2020-04-04 NOTE — Progress Notes (Signed)
Virtual Visit via Video Note  I connected with Shannon [name] on 04/04/20 at  2:30 PM EST by a video enabled telemedicine application and verified that I am speaking with the correct person using two identifiers.  Location: Patient: Patient Home Provider: Home Office  I discussed the limitations of evaluation and management by telemedicine and the availability of in person appointments. The patient expressed understanding and agreed to proceed.  History of Present Illness: Bipolar 1 DO, PTSD, Anxiety  Treatment Plan Goals: 1)Simon would like to stop racing thoughts, through learning and implementing CBT techniques, such as thought stopping, distracting, positive self-talk, to journal, etc. 2)Lotta would like to improve self-esteem and self-image by reducing negative self-talk, focusing on more rational, logical and realistic attributes of self vs distorted and emotional thinking. 3)Xenia would like to "find who I was 5 years ago", noting that she would like to be happier, more confident, engaged in life and improved daily functioning.  Observations/Objective: Counselor met with Client for individual therapy via Webex. Counselor assessed MH symptoms and progress on treatment plan goals, withClient reporting that she is currently feeling overwhelmed and energized, engaging in new activities and hobbies. Client presents with milddepression and moderateanxiety. Client denied suicidal ideation or self-harm behaviors.  Counselor prompted client to share progress on goals with Counselor utilizing MI interventions to assess and prompt motivation to change. Client discussed work on healing from childhood traumas through processing feelings with support system and family members. Client reports that she followed through with homework from last session, beginning to engage in hobbies and creative outlets. Client is finding joy in the new activities, fighting against the urge to be  inactive and disengaged. Counselor processed thoughts and feelings associated with new behaviors. Client additionally opened up about pressures from extended family that is causing anxiety and trigger childhood trauma reminders. Counselor and Client reviewed boundary setting and assertive communication skills.    Assessment and Plan: Counselor will continue to meet with patient to address treatment plan goals. Patient will continue to follow recommendations of providers and implement skills learned in session.  Follow Up Instructions: Counselor will send information for next session via Webex.   The patient was advised to call back or seek an in-person evaluation if the symptoms worsen or if the condition fails to improve as anticipated.  I provided72mnutes of non-face-to-face time during this encounter.   BLise Auer LCSW

## 2020-04-10 ENCOUNTER — Encounter (HOSPITAL_COMMUNITY): Payer: Self-pay | Admitting: Psychiatry

## 2020-04-10 ENCOUNTER — Telehealth (INDEPENDENT_AMBULATORY_CARE_PROVIDER_SITE_OTHER): Payer: Medicare Other | Admitting: Psychiatry

## 2020-04-10 ENCOUNTER — Telehealth: Payer: Self-pay | Admitting: Family

## 2020-04-10 ENCOUNTER — Other Ambulatory Visit: Payer: Self-pay

## 2020-04-10 VITALS — Wt 277.0 lb

## 2020-04-10 DIAGNOSIS — F319 Bipolar disorder, unspecified: Secondary | ICD-10-CM

## 2020-04-10 DIAGNOSIS — F609 Personality disorder, unspecified: Secondary | ICD-10-CM | POA: Diagnosis not present

## 2020-04-10 DIAGNOSIS — F4310 Post-traumatic stress disorder, unspecified: Secondary | ICD-10-CM

## 2020-04-10 MED ORDER — QUETIAPINE FUMARATE 300 MG PO TABS: 300.0000 mg | ORAL_TABLET | Freq: Every day | ORAL | 1 refills | Status: DC

## 2020-04-10 MED ORDER — CLONAZEPAM 0.5 MG PO TABS
0.5000 mg | ORAL_TABLET | Freq: Three times a day (TID) | ORAL | 1 refills | Status: DC | PRN
Start: 2020-04-10 — End: 2020-05-22

## 2020-04-10 MED ORDER — FLUOXETINE HCL 20 MG PO CAPS: 20.0000 mg | ORAL_CAPSULE | Freq: Every day | ORAL | 1 refills | Status: DC
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
## 2020-04-10 NOTE — Telephone Encounter (Signed)
Patient spoke with her psychiatrist today and she states he told her she has diabetes. Patient states she is going to sue [name] because both Dr. Jenny Reichmann and Mickel Baas told her she did not. Patient would like to speak with someone.

## 2020-04-11 NOTE — Telephone Encounter (Signed)
Pt reassured that she does not have diabetes based on her A1c; explained what an A1c is & what her results mean.  Pt verb understanding.  Reviewed notes from [place] on 03/26/20, & explained what PCP was looking for by asking her to get labs drawn.  Pt states she is changing her diet to help with a B12 deficiency, even though she has not had labs drawn yet to determine cause of symptoms.  Encouraged pt to get labs drawn & allow PCP to help determine a next steps based on lab results & symptoms.  Pt verb understanding & is appreciative of the time taken to explain her visits, as she has felt that the providers have been "talking over my head".  Reassured her that she will be contacted after labs have resulted for next steps.

## 2020-04-15 ENCOUNTER — Ambulatory Visit (INDEPENDENT_AMBULATORY_CARE_PROVIDER_SITE_OTHER): Payer: Medicare Other | Admitting: Psychiatry

## 2020-04-15 ENCOUNTER — Other Ambulatory Visit: Payer: Self-pay

## 2020-04-15 DIAGNOSIS — F4310 Post-traumatic stress disorder, unspecified: Secondary | ICD-10-CM

## 2020-04-15 DIAGNOSIS — F319 Bipolar disorder, unspecified: Secondary | ICD-10-CM | POA: Diagnosis not present

## 2020-04-16 ENCOUNTER — Other Ambulatory Visit: Payer: Self-pay | Admitting: Family

## 2020-04-16 ENCOUNTER — Encounter (HOSPITAL_COMMUNITY): Payer: Self-pay | Admitting: Psychiatry

## 2020-04-16 ENCOUNTER — Other Ambulatory Visit (INDEPENDENT_AMBULATORY_CARE_PROVIDER_SITE_OTHER): Payer: Medicare Other

## 2020-04-16 DIAGNOSIS — R202 Paresthesia of skin: Secondary | ICD-10-CM

## 2020-04-16 DIAGNOSIS — M791 Myalgia, unspecified site: Secondary | ICD-10-CM | POA: Diagnosis not present

## 2020-04-16 DIAGNOSIS — R200 Anesthesia of skin: Secondary | ICD-10-CM

## 2020-04-16 LAB — CBC WITH DIFFERENTIAL/PLATELET
Basophils Absolute: 0.1 10*3/uL (ref 0.0–0.1)
Basophils Relative: 1.3 % (ref 0.0–3.0)
Eosinophils Absolute: 0.2 10*3/uL (ref 0.0–0.7)
Eosinophils Relative: 2.2 % (ref 0.0–5.0)
HCT: 37.5 % (ref 36.0–46.0)
Hemoglobin: 12.8 g/dL (ref 12.0–15.0)
Lymphocytes Relative: 27.9 % (ref 12.0–46.0)
Lymphs Abs: 2.4 10*3/uL (ref 0.7–4.0)
MCHC: 34.2 g/dL (ref 30.0–36.0)
MCV: 82.5 fl (ref 78.0–100.0)
Monocytes Absolute: 0.5 10*3/uL (ref 0.1–1.0)
Monocytes Relative: 5.2 % (ref 3.0–12.0)
Neutro Abs: 5.5 10*3/uL (ref 1.4–7.7)
Neutrophils Relative %: 63.4 % (ref 43.0–77.0)
Platelets: 295 10*3/uL (ref 150.0–400.0)
RBC: 4.54 Mil/uL (ref 3.87–5.11)
RDW: 16.2 % — ABNORMAL HIGH (ref 11.5–15.5)
WBC: 8.7 10*3/uL (ref 4.0–10.5)

## 2020-04-16 LAB — COMPREHENSIVE METABOLIC PANEL
ALT: 13 U/L (ref 0–35)
AST: 13 U/L (ref 0–37)
Albumin: 3.7 g/dL (ref 3.5–5.2)
Alkaline Phosphatase: 111 U/L (ref 39–117)
BUN: 12 mg/dL (ref 6–23)
CO2: 29 mEq/L (ref 19–32)
Calcium: 8.9 mg/dL (ref 8.4–10.5)
Chloride: 102 mEq/L (ref 96–112)
Creatinine, Ser: 0.68 mg/dL (ref 0.40–1.20)
GFR: 97.77 mL/min (ref >60.00)
Glucose, Bld: 91 mg/dL (ref 70–99)
Potassium: 3.3 mEq/L — ABNORMAL LOW (ref 3.5–5.1)
Sodium: 140 mEq/L (ref 135–145)
Total Bilirubin: 0.5 mg/dL (ref 0.2–1.2)
Total Protein: 7.9 g/dL (ref 6.0–8.3)

## 2020-04-16 LAB — TSH: TSH: 3.18 u[IU]/mL (ref 0.35–4.50)

## 2020-04-16 LAB — MAGNESIUM: Magnesium: 1.8 mg/dL (ref 1.5–2.5)

## 2020-04-16 LAB — VITAMIN B12: Vitamin B-12: 172 pg/mL — ABNORMAL LOW (ref 211–911)

## 2020-04-16 LAB — CK: Total CK: 182 U/L — ABNORMAL HIGH (ref 7–177)

## 2020-04-16 NOTE — Progress Notes (Signed)
Virtual Visit via Video Note  I connected with Shannon [name] on 04/15/2020 at  1:30 PM EST by a video enabled telemedicine application and verified that I am speaking with the correct person using two identifiers.  Location: Patient: Patient Home Provider: Home Office  I discussed the limitations of evaluation and management by telemedicine and the availability of in person appointments. The patient expressed understanding and agreed to proceed.  History of Present Illness: Bipolar 1 DO, PTSD, Anxiety  Treatment Plan Goals: 1)Shannon [name] would like to stop racing thoughts, through learning and implementing CBT techniques, such as thought stopping, distracting, positive self-talk, to journal, etc. 2)Shannon [name] would like to improve self-esteem and self-image by reducing negative self-talk, focusing on more rational, logical and realistic attributes of self vs distorted and emotional thinking. 3)Shannon [name] would like to "find who I was 5 years ago", noting that she would like to be happier, more confident, engaged in life and improved daily functioning.  Observations/Objective: Counselor met with Client for individual therapy via Webex. Counselor assessed MH symptoms and progress on treatment plan goals, withClient reporting that she is coming down from a manic episode, where she overspent. Client states she is overwhelmed and feeling bad about negative consequences of shopping.  Client presents with moderatedepression and moderateanxiety. Client denied suicidal ideation or self-harm behaviors.  Counselor prompted client to share progress on goals with Counselor utilizing MI interventions to assess and prompt motivation to change. Client shared process of overspending, online shopping behaviors and use of items. Client identified feelings and thoughts associated with behaviors. Counselor used solution focused strategies to create an action plan for backlash of behaviors. Client reports  willingness to communicate with support system, return unopened items, set limit on spending and set up accountability measures. Client identified motivator for change. Client to set follow up appointments.  Assessment and Plan: Counselor will continue to meet with patient to address treatment plan goals. Patient will continue to follow recommendations of providers and implement skills learned in session.  Follow Up Instructions: Counselor will send information for next session via Webex.   The patient was advised to call back or seek an in-person evaluation if the symptoms worsen or if the condition fails to improve as anticipated.  I provided46mnutes of non-face-to-face time during this encounter.   BLise Auer LCSW

## 2020-04-19 ENCOUNTER — Other Ambulatory Visit: Payer: Self-pay | Admitting: Family

## 2020-04-19 ENCOUNTER — Telehealth: Payer: Self-pay | Admitting: Family

## 2020-04-19 DIAGNOSIS — R200 Anesthesia of skin: Secondary | ICD-10-CM

## 2020-04-19 LAB — VITAMIN B1: Vitamin B1 (Thiamine): 25 nmol/L (ref 8–30)

## 2020-04-19 NOTE — Telephone Encounter (Signed)
Patient returned call for lab results. She can be reached at [phone]

## 2020-04-19 NOTE — Telephone Encounter (Signed)
Lab result note read to pt; see result note for labs drawn on 04/16/20

## 2020-04-25 ENCOUNTER — Ambulatory Visit: Payer: Medicare Other

## 2020-04-25 ENCOUNTER — Telehealth: Payer: Self-pay | Admitting: Family

## 2020-04-25 NOTE — Telephone Encounter (Signed)
Pt notified of Laura's response; verb understanding & has no ques/concerns at this time.

## 2020-04-25 NOTE — Telephone Encounter (Signed)
She can take a daily OTC B12 supplement; her pharmacist can help choose a specific supplement. Her B12 level can be re-checked in April.

## 2020-04-25 NOTE — Telephone Encounter (Signed)
Patient called and was wondering if she could take her B12 in a pill. She said she didn't want to do the shots anymore. She can be reached at [phone]. Please advise.

## 2020-04-29 ENCOUNTER — Ambulatory Visit (HOSPITAL_COMMUNITY): Payer: Medicare Other | Admitting: Psychiatry

## 2020-04-29 ENCOUNTER — Other Ambulatory Visit: Payer: Self-pay

## 2020-05-12 ENCOUNTER — Other Ambulatory Visit (HOSPITAL_COMMUNITY): Payer: Self-pay | Admitting: Psychiatry

## 2020-05-12 DIAGNOSIS — F4310 Post-traumatic stress disorder, unspecified: Secondary | ICD-10-CM

## 2020-05-13 ENCOUNTER — Encounter (HOSPITAL_COMMUNITY): Payer: Self-pay | Admitting: Psychiatry

## 2020-05-13 ENCOUNTER — Ambulatory Visit (HOSPITAL_COMMUNITY): Payer: Medicare Other | Admitting: Psychiatry

## 2020-05-13 ENCOUNTER — Other Ambulatory Visit: Payer: Self-pay

## 2020-05-13 DIAGNOSIS — F4310 Post-traumatic stress disorder, unspecified: Secondary | ICD-10-CM

## 2020-05-13 DIAGNOSIS — F319 Bipolar disorder, unspecified: Secondary | ICD-10-CM

## 2020-05-13 NOTE — Progress Notes (Signed)
Virtual Visit via Video Note  I connected with Shannon [name] on 05/13/20 at  1:30 PM EDT by a video enabled telemedicine application and verified that I am speaking with the correct person using two identifiers.  Location: Patient: Patient Home Provider: Home Office  I discussed the limitations of evaluation and management by telemedicine and the availability of in person appointments. The patient expressed understanding and agreed to proceed.  History of Present Illness: Bipolar 1 DO, PTSD, Anxiety  Treatment Plan Goals: 1)Shannon [name] would like to stop racing thoughts, through learning and implementing CBT techniques, such as thought stopping, distracting, positive self-talk, to journal, etc. 2)Shannon [name] would like to improve self-esteem and self-image by reducing negative self-talk, focusing on more rational, logical and realistic attributes of self vs distorted and emotional thinking. 3)Shannon [name] would like to "find who I was 5 years ago", noting that she would like to be happier, more confident, engaged in life and improved daily functioning.  Observations/Objective: Counselor met with Client for individual therapy via Webex. Counselor assessed MH symptoms and progress on treatment plan goals, withClient reporting that she is coming down from a manic episode where she severely overspent on purchases for new hobby. Client expressed mixed emotions, primarily overwhelmed, guilty, disappointed and ashamed. Client presents with moderatedepression and severeanxiety. Client denied suicidal ideation or self-harm behaviors.  Goals 1-3) Counselor prompted Client to share progress on goals with Counselor utilizing MI interventions to assess and prompt motivation to change. Client discussed issue with overspending. Counselor asked direct questions regarding total charged, amount of cards opened, behaviors surrounding spending and plan for repayment. Counselor and Client processed thoughts  feelings and behaviors using CBT interventions. Counselor and Client discussed and made a plan for transparency and accountability with support system to curb and address behaviors. Client was willing to implement plan and stated desire to change. Client to follow up with changes at next appointment.   Assessment and Plan: Counselor will continue to meet with patient to address treatment plan goals. Patient will continue to follow recommendations of providers and implement skills learned in session.  Follow Up Instructions: Counselor will send information for next session via Webex.   The patient was advised to call back or seek an in-person evaluation if the symptoms worsen or if the condition fails to improve as anticipated.  I provided26mnutes of non-face-to-face time during this encounter.   BLise Auer LCSW

## 2020-05-22 ENCOUNTER — Telehealth (INDEPENDENT_AMBULATORY_CARE_PROVIDER_SITE_OTHER): Payer: Medicare Other | Admitting: Psychiatry

## 2020-05-22 ENCOUNTER — Other Ambulatory Visit: Payer: Self-pay

## 2020-05-22 DIAGNOSIS — F319 Bipolar disorder, unspecified: Secondary | ICD-10-CM

## 2020-05-22 DIAGNOSIS — F4310 Post-traumatic stress disorder, unspecified: Secondary | ICD-10-CM | POA: Diagnosis not present

## 2020-05-22 MED ORDER — FLUOXETINE HCL 20 MG PO CAPS: 20.0000 mg | ORAL_CAPSULE | Freq: Every day | ORAL | 2 refills | Status: DC

## 2020-05-22 MED ORDER — QUETIAPINE FUMARATE 300 MG PO TABS: 300.0000 mg | ORAL_TABLET | Freq: Every day | ORAL | 2 refills | Status: DC

## 2020-05-22 MED ORDER — CLONAZEPAM 0.5 MG PO TABS: 0.5000 mg | ORAL_TABLET | Freq: Three times a day (TID) | ORAL | 2 refills | Status: DC | PRN
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
## 2020-05-27 ENCOUNTER — Other Ambulatory Visit: Payer: Self-pay

## 2020-05-27 ENCOUNTER — Ambulatory Visit (INDEPENDENT_AMBULATORY_CARE_PROVIDER_SITE_OTHER): Payer: Medicare Other | Admitting: Psychiatry

## 2020-05-27 DIAGNOSIS — F319 Bipolar disorder, unspecified: Secondary | ICD-10-CM | POA: Diagnosis not present

## 2020-05-27 DIAGNOSIS — F4310 Post-traumatic stress disorder, unspecified: Secondary | ICD-10-CM

## 2020-05-27 NOTE — Progress Notes (Signed)
Virtual Visit via Video Note  I connected with Shannon [name] on 05/27/20 at  2:30 PM EDT by a video enabled telemedicine application and verified that I am speaking with the correct person using two identifiers.  Location: Patient: Patient Home Provider: Home Office  I discussed the limitations of evaluation and management by telemedicine and the availability of in person appointments. The patient expressed understanding and agreed to proceed.  History of Present Illness: Bipolar 1 DO, PTSD, Anxiety  Treatment Plan Goals: 1)Shannon [name] would like to stop racing thoughts, through learning and implementing CBT techniques, such as thought stopping, distracting, positive self-talk, to journal, etc. 2)Shannon [name] would like to improve self-esteem and self-image by reducing negative self-talk, focusing on more rational, logical and realistic attributes of self vs distorted and emotional thinking. 3)Shannon [name] would like to "find who I was 5 years ago", noting that she would like to be happier, more confident, engaged in life and improved daily functioning.  Observations/Objective: Counselor met with Client for individual therapy via Webex. Counselor assessed MH symptoms and progress on treatment plan goals, withClient reporting thatis currently elevated and agitatted due a concerning conversation with her estranged son in addition to other life stressors. Client presents with moderatedepression and moderateanxiety. Client denied suicidal ideation or self-harm behaviors.  Counselor prompted client to share progress on goals 1-3 with Counselor utilizing MI interventions to assess and prompt motivation to change. Counselor first addressed current elevated mood by prompting application of relaxation shills. Client was able to decrease stress response within 1-66 minutes of application. Shannon [name] noted thought stopping techniques to be most helpful. Client shared process of overspending, online shopping  behaviors and plan for items. Counselor discussed trauma triggers and unhealthy coping skills associated with interactions with her family-how it negatively impacts functioning. Counselor revisited boundary setting and communicating needs with Client to apply with various interactions with family. Client identified her core motivator as her grand children's well-being to prevent negative coping skills to emerge. Client reported feeling more centered and grounded, with a focus and purpose at the end of session.Client reports taking medications as prescribed daily.    Assessment and Plan: Counselor will continue to meet with patient to address treatment plan goals. Patient will continue to follow recommendations of providers and implement skills learned in session.  Follow Up Instructions: Counselor will send information for next session via Webex.   The patient was advised to call back or seek an in-person evaluation if the symptoms worsen or if the condition fails to improve as anticipated.  I provided45mnutes of non-face-to-face time during this encounter.   BLise Auer LCSW

## 2020-05-30 ENCOUNTER — Other Ambulatory Visit (HOSPITAL_COMMUNITY): Payer: Self-pay | Admitting: Psychiatry

## 2020-05-30 ENCOUNTER — Encounter (HOSPITAL_COMMUNITY): Payer: Self-pay | Admitting: Psychiatry

## 2020-05-30 ENCOUNTER — Other Ambulatory Visit: Payer: Self-pay | Admitting: Family

## 2020-05-30 DIAGNOSIS — F4310 Post-traumatic stress disorder, unspecified: Secondary | ICD-10-CM

## 2020-05-30 DIAGNOSIS — F319 Bipolar disorder, unspecified: Secondary | ICD-10-CM

## 2020-05-31 ENCOUNTER — Ambulatory Visit: Payer: Medicare Other | Admitting: Family

## 2020-06-02 ENCOUNTER — Other Ambulatory Visit: Payer: Self-pay | Admitting: Internal Medicine

## 2020-06-03 ENCOUNTER — Ambulatory Visit (INDEPENDENT_AMBULATORY_CARE_PROVIDER_SITE_OTHER): Payer: Medicare Other | Admitting: Psychiatry

## 2020-06-03 ENCOUNTER — Other Ambulatory Visit: Payer: Self-pay

## 2020-06-03 ENCOUNTER — Encounter (HOSPITAL_COMMUNITY): Payer: Self-pay | Admitting: Psychiatry

## 2020-06-03 DIAGNOSIS — F319 Bipolar disorder, unspecified: Secondary | ICD-10-CM | POA: Diagnosis not present

## 2020-06-03 DIAGNOSIS — F4310 Post-traumatic stress disorder, unspecified: Secondary | ICD-10-CM | POA: Diagnosis not present

## 2020-06-03 NOTE — Progress Notes (Signed)
Virtual Visit via Video Note  I connected with Shannon [name] on 06/03/20 at  2:30 PM EDT by a video enabled telemedicine application and verified that I am speaking with the correct person using two identifiers.  Location: Patient: Patient Home Provider: Home Office  I discussed the limitations of evaluation and management by telemedicine and the availability of in person appointments. The patient expressed understanding and agreed to proceed.  History of Present Illness: Bipolar 1 DO, PTSD, Anxiety  Treatment Plan Goals: 1)Shannon [name] would like to stop racing thoughts, through learning and implementing CBT techniques, such as thought stopping, distracting, positive self-talk, to journal, etc. 2)Shannon [name] would like to improve self-esteem and self-image by reducing negative self-talk, focusing on more rational, logical and realistic attributes of self vs distorted and emotional thinking. 3)Shannon [name] would like to "find who I was 5 years ago", noting that she would like to be happier, more confident, engaged in life and improved daily functioning.  Observations/Objective: Counselor met with Client for individual therapy via Webex. Counselor assessed MH symptoms and progress on treatment plan goals, withClient reporting thatis currently feeling stable and in contemplation stage. Client presents with moderatedepression and moderateanxiety. Client denied suicidal ideation or self-harm behaviors.  Counselor prompted client to share progress on goals 1-3 with Counselor utilizing MI interventions to assess and prompt motivation to change.Counselor reviewed skills used in last session to deescalate and praised Client for ability to calm self and use solution focused strategies. Client shared updates on stressors and triggers discussed at last session. Counselor assisted Client in preparing for upcoming trip to visit family through boundary setting and assertive communication of personal needs  and expectations. Client discussed plan and additional considerations. Counselor assessed progress on self-image/esteem and connection with support system. Client to address ongoing financial stressors in collaboration with husband. Client states feeling more confident in managing self and making healthier decisions when distressed. Client to set up additional sessions and is aware of how to address crisis in Counselors absence.   Assessment and Plan: Counselor will continue to meet with patient to address treatment plan goals. Patient will continue to follow recommendations of providers and implement skills learned in session.  Follow Up Instructions: Counselor will send information for next session via Webex.   The patient was advised to call back or seek an in-person evaluation if the symptoms worsen or if the condition fails to improve as anticipated.  I provided3mnutes of non-face-to-face time during this encounter.   BLise Auer LCSW

## 2020-06-03 NOTE — Telephone Encounter (Signed)
To pcp please 

## 2020-06-24 ENCOUNTER — Ambulatory Visit (INDEPENDENT_AMBULATORY_CARE_PROVIDER_SITE_OTHER): Payer: Medicare Other | Admitting: Psychiatry

## 2020-06-24 ENCOUNTER — Other Ambulatory Visit: Payer: Self-pay

## 2020-06-24 DIAGNOSIS — F4310 Post-traumatic stress disorder, unspecified: Secondary | ICD-10-CM

## 2020-06-24 DIAGNOSIS — F319 Bipolar disorder, unspecified: Secondary | ICD-10-CM

## 2020-06-26 NOTE — Progress Notes (Signed)
Virtual Visit via Video Note  I connected with Shannon [name] on 06/24/20 at  3:30 PM EDT by a video enabled telemedicine application and verified that I am speaking with the correct person using two identifiers.  Location: Patient: Patient Home Provider: Home Office  I discussed the limitations of evaluation and management by telemedicine and the availability of in person appointments. The patient expressed understanding and agreed to proceed.  History of Present Illness: Bipolar 1 DO, PTSD, Anxiety  Treatment Plan Goals: 1)Shannon [name] would like to stop racing thoughts, through learning and implementing CBT techniques, such as thought stopping, distracting, positive self-talk, to journal, etc. 2)Shannon [name] would like to improve self-esteem and self-image by reducing negative self-talk, focusing on more rational, logical and realistic attributes of self vs distorted and emotional thinking. 3)Shannon [name] would like to "find who I was 5 years ago", noting that she would like to be happier, more confident, engaged in life and improved daily functioning.  Observations/Objective: Counselor met with Client for individual therapy via Webex. Counselor assessed MH symptoms and progress on treatment plan goals, withClient reporting that positive behavior changes around boundary setting and over spending, with signs of mania subsiding.Client presents with milddepression and mildanxiety. Client denied suicidal ideation or self-harm behaviors.  Counselor prompted client to share progress on goals1-3with Counselor utilizing MI interventions to assess and prompt motivation to change.Client shared updates on actions toward treatment plan goals with positive effects/outcomes. Counselor celebrated and praised Client for challenging behaviors and main corporating support system in accountability. Client reports improved mood and functioning, as well as improved view of self through though-stopping, positive  self-talk and personal daily challenges. Client feeling more hopeful about future. Client reassessed 2 anxiety provoking situations and reestablished boundaries more according to her needs and values. Client to continue to more closely incorporate husband and best friend in her recovery and will report back skill application at next session.   Assessment and Plan: Counselor will continue to meet with patient to address treatment plan goals. Patient will continue to follow recommendations of providers and implement skills learned in session.  Follow Up Instructions: Counselor will send information for next session via Webex.   The patient was advised to call back or seek an in-person evaluation if the symptoms worsen or if the condition fails to improve as anticipated.  I provided55mnutes of non-face-to-face time during this encounter.   BLise Auer LCSW

## 2020-06-29 ENCOUNTER — Encounter (HOSPITAL_COMMUNITY): Payer: Self-pay | Admitting: Psychiatry

## 2020-07-08 ENCOUNTER — Ambulatory Visit (INDEPENDENT_AMBULATORY_CARE_PROVIDER_SITE_OTHER): Payer: Medicare Other | Admitting: Psychiatry

## 2020-07-08 ENCOUNTER — Encounter (HOSPITAL_COMMUNITY): Payer: Self-pay | Admitting: Psychiatry

## 2020-07-08 ENCOUNTER — Other Ambulatory Visit: Payer: Self-pay

## 2020-07-08 DIAGNOSIS — F319 Bipolar disorder, unspecified: Secondary | ICD-10-CM

## 2020-07-08 DIAGNOSIS — F4310 Post-traumatic stress disorder, unspecified: Secondary | ICD-10-CM | POA: Diagnosis not present

## 2020-07-08 NOTE — Progress Notes (Signed)
Virtual Visit via Video Note  I connected with Shannon [name] on 07/08/20 at  1:30 PM EDT by a video enabled telemedicine application and verified that I am speaking with the correct person using two identifiers.  Location: Patient: Patient Home Provider: Home Office  I discussed the limitations of evaluation and management by telemedicine and the availability of in person appointments. The patient expressed understanding and agreed to proceed.  History of Present Illness: Bipolar 1 DO, PTSD  Treatment Plan Goals: 1)Shannon [name] would like to stop racing thoughts, through learning and implementing CBT techniques, such as thought stopping, distracting, positive self-talk, to journal, etc. 2)Shannon [name] would like to improve self-esteem and self-image by reducing negative self-talk, focusing on more rational, logical and realistic attributes of self vs distorted and emotional thinking. 3)Shannon [name] would like to "find who I was 5 years ago", noting that she would like to be happier, more confident, engaged in life and improved daily functioning.  Observations/Objective: Counselor met with Client for individual therapy via Webex. Counselor assessed MH symptoms and progress on treatment plan goals, withClient reporting thatmood and ADLs have improved, challenging self to get out of bed, home and spend time in the community with supports.Client presents with moderatedepression and moderateanxiety. Client denied suicidal ideation or self-harm behaviors.  Counselor prompted client to share progress on goals1-3with Counselor utilizing MI interventions to assess and prompt motivation to change.Counselor reviewed skills used in last session to deescalate and praised Client for ability to calm self and use solution focused strategies. Client shared updates on stressors and triggers discussed at last session. Client reports that she is noticing positive impacts of assertive boundary setting with  family. Client notes less stress and tension impacting her mental health. Counselor praised Client for application of skills. Client noted going on two outings with friends and plans for more upcoming outings. Counselor and Client identified strategies to implement to manage triggers and anxiety when in public settings with friends. Client reports feeling more confident in engaging in social activities. Counselor and Client processed childhood and family related traumas, with Client noting progress in these areas, feeling relieved to separate self from specific aspects of her family dynamics and participating more with healthier/more respectful family members. 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 Counselor offered homework for Client to attempt in relation to self-work between sessions.   Assessment and Plan: Counselor will continue to meet with patient to address treatment plan goals. Patient will continue to follow recommendations of providers and implement skills learned in session.  Follow Up Instructions: Counselor will send information for next session via Webex.   The patient was advised to call back or seek an in-person evaluation if the symptoms worsen or if the condition fails to improve as anticipated.  I provided76mnutes of non-face-to-face time during this encounter.   BLise Auer LCSW

## 2020-07-23 ENCOUNTER — Other Ambulatory Visit: Payer: Self-pay

## 2020-07-23 ENCOUNTER — Ambulatory Visit (INDEPENDENT_AMBULATORY_CARE_PROVIDER_SITE_OTHER): Payer: Medicare Other | Admitting: Psychiatry

## 2020-07-23 DIAGNOSIS — F319 Bipolar disorder, unspecified: Secondary | ICD-10-CM

## 2020-07-23 DIAGNOSIS — F4310 Post-traumatic stress disorder, unspecified: Secondary | ICD-10-CM

## 2020-07-23 NOTE — Progress Notes (Signed)
Virtual Visit via Video Note  I connected with Shannon [name] on 07/23/20 at  3:30 PM EDT by a video enabled telemedicine application and verified that I am speaking with the correct person using two identifiers.  Location: Patient: Patient Home Provider: Home Office  I discussed the limitations of evaluation and management by telemedicine and the availability of in person appointments. The patient expressed understanding and agreed to proceed.  History of Present Illness: Bipolar 1 DO, PTSD  Treatment Plan Goals: 1)Shannon [name] would like to stop racing thoughts, through learning and implementing CBT techniques, such as thought stopping, distracting, positive self-talk, to journal, etc. 2)Shannon [name] would like to improve self-esteem and self-image by reducing negative self-talk, focusing on more rational, logical and realistic attributes of self vs distorted and emotional thinking. 3)Shannon [name] would like to "find who I was 5 years ago", noting that she would like to be happier, more confident, engaged in life and improved daily functioning.  Observations/Objective: Counselor met with Client for individual therapy via Webex. Counselor assessed MH symptoms and progress on treatment plan goals, withClient reporting thatmood and ADLs have improved, challenging self to get out of bed, home and spend time in the community with supports.Client presents with moderatedepression and moderateanxiety. Client denied suicidal ideation or self-harm behaviors.  Counselor prompted client to share progress on goals1-3with Counselor utilizing MI interventions to assess and prompt motivation to change Client shared that she managed to attend 2 social events with friends over the past 2 weeks. Counselor processed skills used to maintain stability and meet personal needs during the events. Client able to identify strategies utilized and how she implemented assertiveness. 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 Client is concerned that she is unable to stop shopping ans spending, and keeping amount from partner. Counselor reviewed behavioral and cognitive interventions, as well as accountability factors. Client states she isunwilling and unable to stop. Client to communicated with psychiatrist about medication needs at this time to address behaviors and thought processes Client reports improved communication with partner and spouse. Client and Counselor reflected on progress made over the past year and identified stuck spots in trauma history and current relationships. Client to journal about themes in [place] reminders.   Assessment and Plan: Counselor will continue to meet with patient to address treatment plan goals. Patient will continue to follow recommendations of providers and implement skills learned in session.  Follow Up Instructions: Counselor will send information for next session via Webex.   The patient was advised to call back or seek an in-person evaluation if the symptoms worsen or if the condition fails to improve as anticipated.  I provided78mnutes of non-face-to-face time during this encounter.   BLise Auer LCSW

## 2020-07-24 ENCOUNTER — Encounter: Payer: Medicare Other | Admitting: Internal Medicine

## 2020-07-26 ENCOUNTER — Encounter (HOSPITAL_COMMUNITY): Payer: Self-pay | Admitting: Psychiatry

## 2020-08-01 ENCOUNTER — Encounter (HOSPITAL_COMMUNITY): Payer: Self-pay | Admitting: Psychiatry

## 2020-08-01 ENCOUNTER — Other Ambulatory Visit: Payer: Self-pay

## 2020-08-01 ENCOUNTER — Ambulatory Visit (INDEPENDENT_AMBULATORY_CARE_PROVIDER_SITE_OTHER): Payer: Medicare Other | Admitting: Psychiatry

## 2020-08-01 DIAGNOSIS — F319 Bipolar disorder, unspecified: Secondary | ICD-10-CM

## 2020-08-01 DIAGNOSIS — F4310 Post-traumatic stress disorder, unspecified: Secondary | ICD-10-CM | POA: Diagnosis not present

## 2020-08-01 NOTE — Progress Notes (Signed)
Virtual Visit via Video Note  I connected with Shannon [name] on 08/01/20 at  1:30 PM EDT by a video enabled telemedicine application and verified that I am speaking with the correct person using two identifiers.  Location: Patient: Patient Home Provider: Home Office   I discussed the limitations of evaluation and management by telemedicine and the availability of in person appointments. The patient expressed understanding and agreed to proceed.   History of Present Illness: Bipolar 1 DO, PTSD   Treatment Plan Goals: 1) Shannon [name] would like to stop racing thoughts, through learning and implementing CBT techniques, such as thought stopping, distracting, positive self-talk, to journal, etc.  2) Shannon [name] would like to improve self-esteem and self-image by reducing negative self-talk, focusing on more rational, logical and realistic attributes of self vs distorted and emotional thinking. 3) Shannon [name] would like to "find who I was 5 years ago", noting that she would like to be happier, more confident, engaged in life and improved daily functioning.   Observations/Objective: Counselor met with Shannon [name] for individual therapy via Webex. Counselor assessed MH symptoms and progress on treatment plan goals, with Shannon [name] reporting that she is having thoughts of giving up, questioning purpose of life and continuing on. Shannon [name] was agitated, resistant to engaging, refusing to answer questions, using profanity in a frustrated manner. Counselor was able to stay connected with Shannon [name] and regulate her through empathetic listening, identifying progress, accomplishments, important connections and strengths. Counselor assessed suicidality using C-SSRS-short assessment. Shannon [name] denied a plan or means. Shannon [name] identified that past attempts to escape via binge drinking were not appealing to her because of the after effects. Counselor reviewed safety plan of communicating with husband, [employer], calling [facility],  Therapist, music and in-person assessment at [employer] or [employer]. Since last session Counselor communicate with psychiatrist to assess medications due to manic behaviors such as starting a business and overspending-exceeding 20K in debt. Shannon [name] stated that she had not picked up medications or changed medication routine, because she has her phone on do not disturb all week. Shannon [name] requested Counselor communicate current concerns with psychiatrist, as mood and functioning is declining. Counselor to communicate concerns today. Shannon [name] presents with severe depression and moderate anxiety. Shannon [name] denied homicidal ideation or self-harm behaviors.    Counselor prompted Shannon [name] to share progress on goals 1-3 with Counselor utilizing MI interventions to assess and prompt motivation to change and TFCBT skills and concepts to address trauma responses and triggers. Outside of above interventions, Shannon [name] communicated about perceived lack of progress on goals, current life stressors, childhood traumas and breaking generational patterns, close connections with [employer], and her relationship with grandchildren. Shannon [name] was able to identify that relationship with friend/business partner has become overwhelming and unhealthy for mental health. Shannon [name] engaged in negative self-talk about her capabilities as a wife, with Counselor prompting and modeling CBT skills of rational statements and beliefs. Thought Shannon [name] was excalated throughout the session, Shannon [name] was able to self-regulate through interventions and processing of feelings. Shannon [name] stated she is willing to stay engaged in treatment and follow safety plan.    Assessment and Plan: Counselor will continue to meet with patient to address treatment plan goals. Patient will continue to follow recommendations of providers and implement skills learned in session.   Follow Up Instructions: Counselor will send information for next session via Webex.   The patient was  advised to call back or seek an in-person evaluation if the symptoms worsen or if the condition fails to improve as anticipated.  I provided 65 minutes of non-face-to-face time during this encounter.     Lise Auer, LCSW

## 2020-08-02 ENCOUNTER — Telehealth (HOSPITAL_COMMUNITY): Payer: Self-pay | Admitting: Licensed Clinical Social Worker

## 2020-08-06 ENCOUNTER — Other Ambulatory Visit: Payer: Self-pay

## 2020-08-06 ENCOUNTER — Encounter (HOSPITAL_COMMUNITY): Payer: Self-pay | Admitting: Psychiatry

## 2020-08-06 ENCOUNTER — Other Ambulatory Visit (HOSPITAL_COMMUNITY): Payer: Self-pay | Admitting: *Deleted

## 2020-08-06 ENCOUNTER — Telehealth (INDEPENDENT_AMBULATORY_CARE_PROVIDER_SITE_OTHER): Payer: Medicare Other | Admitting: Psychiatry

## 2020-08-06 VITALS — Wt 300.0 lb

## 2020-08-06 DIAGNOSIS — F319 Bipolar disorder, unspecified: Secondary | ICD-10-CM | POA: Diagnosis not present

## 2020-08-06 DIAGNOSIS — F4310 Post-traumatic stress disorder, unspecified: Secondary | ICD-10-CM

## 2020-08-06 DIAGNOSIS — F609 Personality disorder, unspecified: Secondary | ICD-10-CM

## 2020-08-06 DIAGNOSIS — Z79899 Other long term (current) drug therapy: Secondary | ICD-10-CM

## 2020-08-06 MED ORDER — ARIPIPRAZOLE 5 MG PO TABS: 5.0000 mg | ORAL_TABLET | Freq: Every day | ORAL | 0 refills | Status: DC

## 2020-08-06 MED ORDER — QUETIAPINE FUMARATE 300 MG PO TABS: 300.0000 mg | ORAL_TABLET | Freq: Every day | ORAL | 0 refills | Status: DC

## 2020-08-06 MED ORDER — CLONAZEPAM 0.5 MG PO TABS: 0.5000 mg | ORAL_TABLET | Freq: Three times a day (TID) | ORAL | 0 refills | Status: DC | PRN

## 2020-08-06 MED ORDER — FLUOXETINE HCL 20 MG PO CAPS: 20.0000 mg | ORAL_CAPSULE | Freq: Every day | ORAL | 0 refills | Status: DC
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
## 2020-08-08 ENCOUNTER — Ambulatory Visit (HOSPITAL_COMMUNITY): Payer: Medicare Other | Admitting: Psychiatry

## 2020-08-08 ENCOUNTER — Other Ambulatory Visit: Payer: Self-pay

## 2020-08-12 ENCOUNTER — Telehealth: Payer: Self-pay | Admitting: Family

## 2020-08-12 NOTE — Telephone Encounter (Addendum)
   Patient calling to report trouble swallowing, trouble eating, sores in mouth and tongue  Advised patient to speak with [facility]; patient declined

## 2020-08-13 ENCOUNTER — Ambulatory Visit (INDEPENDENT_AMBULATORY_CARE_PROVIDER_SITE_OTHER): Payer: Medicare Other | Admitting: Family Medicine

## 2020-08-13 ENCOUNTER — Other Ambulatory Visit: Payer: Self-pay

## 2020-08-13 ENCOUNTER — Encounter: Payer: Self-pay | Admitting: Family Medicine

## 2020-08-13 VITALS — BP 130/80 | HR 110 | Temp 98.1°F | Resp 20 | Ht 64.0 in | Wt 277.8 lb

## 2020-08-13 DIAGNOSIS — B370 Candidal stomatitis: Secondary | ICD-10-CM | POA: Insufficient documentation

## 2020-08-13 DIAGNOSIS — R490 Dysphonia: Secondary | ICD-10-CM | POA: Diagnosis not present

## 2020-08-13 DIAGNOSIS — R1310 Dysphagia, unspecified: Secondary | ICD-10-CM | POA: Insufficient documentation

## 2020-08-13 LAB — CBC WITH DIFFERENTIAL/PLATELET
Basophils Absolute: 0.1 10*3/uL (ref 0.0–0.1)
Basophils Relative: 0.5 % (ref 0.0–3.0)
Eosinophils Absolute: 0.2 10*3/uL (ref 0.0–0.7)
Eosinophils Relative: 2.3 % (ref 0.0–5.0)
HCT: 39.3 % (ref 36.0–46.0)
Hemoglobin: 13.1 g/dL (ref 12.0–15.0)
Lymphocytes Relative: 22.7 % (ref 12.0–46.0)
Lymphs Abs: 2.3 10*3/uL (ref 0.7–4.0)
MCHC: 33.3 g/dL (ref 30.0–36.0)
MCV: 78.9 fl (ref 78.0–100.0)
Monocytes Absolute: 0.4 10*3/uL (ref 0.1–1.0)
Monocytes Relative: 3.9 % (ref 3.0–12.0)
Neutro Abs: 7.3 10*3/uL (ref 1.4–7.7)
Neutrophils Relative %: 70.6 % (ref 43.0–77.0)
Platelets: 262 10*3/uL (ref 150.0–400.0)
RBC: 4.98 Mil/uL (ref 3.87–5.11)
RDW: 16 % — ABNORMAL HIGH (ref 11.5–15.5)
WBC: 10.3 10*3/uL (ref 4.0–10.5)

## 2020-08-13 LAB — COMPREHENSIVE METABOLIC PANEL
ALT: 13 U/L (ref 0–35)
AST: 12 U/L (ref 0–37)
Albumin: 3.8 g/dL (ref 3.5–5.2)
Alkaline Phosphatase: 138 U/L — ABNORMAL HIGH (ref 39–117)
BUN: 6 mg/dL (ref 6–23)
CO2: 29 mEq/L (ref 19–32)
Calcium: 8.6 mg/dL (ref 8.4–10.5)
Chloride: 100 mEq/L (ref 96–112)
Creatinine, Ser: 0.88 mg/dL (ref 0.40–1.20)
GFR: 73.61 mL/min (ref >60.00)
Glucose, Bld: 83 mg/dL (ref 70–99)
Potassium: 3.3 mEq/L — ABNORMAL LOW (ref 3.5–5.1)
Sodium: 139 mEq/L (ref 135–145)
Total Bilirubin: 0.8 mg/dL (ref 0.2–1.2)
Total Protein: 7.7 g/dL (ref 6.0–8.3)

## 2020-08-13 MED ORDER — NYSTATIN 100000 UNIT/ML MT SUSP: 5.0000 mL | Freq: Four times a day (QID) | OROMUCOSAL | 0 refills | Status: DC

## 2020-08-13 MED ORDER — PANTOPRAZOLE SODIUM 40 MG PO TBEC
40.0000 mg | DELAYED_RELEASE_TABLET | Freq: Two times a day (BID) | ORAL | 3 refills | Status: DC
Start: 2020-08-13 — End: 2021-04-14

## 2020-08-13 NOTE — Patient Instructions (Signed)
Oral Thrush, Adult Oral thrush, also called oral candidiasis, is a fungal infection that develops in the mouth and throat and on the tongue. It causes white patches to form inthe mouth and on the tongue. Many cases of thrush are mild, but this infection can also be serious. Shannon [name] can be a repeated (recurrent) problem for certain people who have a weak body defense system (immune system). The weakness can be caused by chronic illnesses, or by taking medicines that limit the body's ability to fight infection. If a person has difficulty fighting infection, the fungus that causes thrush can spread through the body.This can cause life-threatening blood or organ infections. What are the causes? This condition is caused by a fungus (yeast) called Candida albicans. This fungus is normally present in small amounts in the mouth and on other mucous membranes. It usually causes no harm. If conditions are present that allow the fungus to grow without control, it invades surrounding tissues and becomes an infection. Other Candida species can also lead to thrush, though this is rare. What increases the risk? The following factors may make you more likely to develop this condition: Having a weakened immune system. Being an older adult. Having diabetes, cancer, or HIV (human immunodeficiency virus). Having dry mouth (xerostomia). Being pregnant or breastfeeding. Having poor dental care, especially in those who have dentures. Using antibiotic or steroid medicines. What are the signs or symptoms? Symptoms of this condition can vary from mild and moderate to severe and persistent. Symptoms may include: A burning feeling in the mouth and throat. This can occur at the start of a thrush infection. White patches that stick to the mouth and tongue. The tissue around the patches may be red, raw, and painful. If rubbed (during tooth brushing, for example), the patches and the tissue of the mouth may bleed easily. A bad  taste in the mouth or difficulty tasting foods. A cottony feeling in the mouth. Pain during eating and swallowing. Poor appetite. Cracking at the corners of the mouth. How is this diagnosed? This condition is diagnosed based on: A physical exam. Your medical history. How is this treated? This condition is treated with medicines called antifungals, which prevent the growth of fungi. These medicines are either applied directly to the affected area (topical) or swallowed (oral). The treatment will depend on the severity of the condition. Mild cases of thrush may be treated with an antifungal mouth rinse or lozenges. Treatment usually lasts about 14 days. Moderate to severe cases of thrush can be treated with oral antifungal medicine, if they have spread to the esophagus. A topical antifungal medicine may also be used. For some severe infections, treatment may need to continue for more than 14 days. Oral antifungal medicines are rarely used during pregnancy because they may be harmful to the unborn child. If you are pregnant, talk with your health care provider about options for treatment. Persistent or recurrent thrush. For cases of thrush that do not go away or keep coming back: Treatment may be needed twice as long as the symptoms last. Treatment will include both oral and topical antifungal medicines. People with a weakened immune system can take an antifungal medicine on a continuous basis to prevent thrush infections. It is important to treat conditions that make a person more likely to getthrush, such as diabetes or HIV. Follow these instructions at home: Medicines Take or use over-the-counter and prescription medicines only as told by your health care provider. Talk with your health care provider about  an over-the-counter medicine called gentian violet, which kills bacteria and fungi. Relieving soreness and discomfort To help reduce the discomfort of thrush: Drink cold liquids such as  water or iced tea. Try flavored ice treats or frozen juices. Eat foods that are easy to swallow, such as gelatin, ice cream, or custard. Try drinking from a straw if the patches in your mouth are painful.  General instructions Eat plain, unflavored yogurt as directed by your health care provider. Check the label to make sure the yogurt contains live cultures. This yogurt can help healthy bacteria grow in the mouth and can stop the growth of the fungus that causes thrush. If you wear dentures, remove the dentures before going to bed, brush them vigorously, and soak them in a cleaning solution as directed by your health care provider. Rinse your mouth with a warm salt-water mixture several times a day. To make a salt-water mixture, dissolve -1 tsp (3-6 g) of salt in 1 cup (237 mL) of warm water. Contact a health care provider if: Your symptoms are getting worse or are not improving within 7 days of starting treatment. You have symptoms of a spreading infection, such as white patches on the skin outside of the mouth. You are breastfeeding your baby and you have redness and pain in the nipples. Summary Oral thrush, also called oral candidiasis, is a fungal infection that develops in the mouth and throat and on the tongue. It causes white patches to form in the mouth and on the tongue. You are more likely to get this condition if you have a weakened immune system or an underlying condition, such as HIV, cancer, or diabetes. This condition is treated with medicines called antifungals, which prevent the growth of fungi. Contact a health care provider if your symptoms do not improve, or get worse, within 7 days of starting treatment. This information is not intended to replace advice given to you by your health care provider. Make sure you discuss any questions you have with your healthcare provider. Document Revised: 12/16/2018 Document Reviewed: 12/16/2018 Elsevier Patient Education  [place].

## 2020-08-13 NOTE — Assessment & Plan Note (Signed)
Inc protonix to bid and refer to GI

## 2020-08-13 NOTE — Assessment & Plan Note (Signed)
Nystatin swish and spit ? Etiology Refer to ent

## 2020-08-13 NOTE — Assessment & Plan Note (Signed)
Refer to ent 

## 2020-08-13 NOTE — Progress Notes (Signed)
Subjective:   By signing my name below, I, Shannon [name], attest that this documentation has been prepared under the direction and in the presence of Dr. Roma Schanz, DO. 08/13/2020    Patient ID: Shannon [name], female    DOB: November 03, 1965, 55 y.o.   MRN: [id number]  Chief Complaint  Patient presents with   trouble swallowing    Pt states having trouble swallowing and states tonsils have been swollen. Pt states tasting blood. X1 year. Pt also states having mouth sores the last 2 weeks    HPI Patient is in today for a office visit. She complains of occasional difficulty swallowing  for the past year. She notes having a golf ball size mass in her throat that did not bother her and resolves by itself after some time. She soon noticed it started frequently returning and started showing up on the right side of her throat. She also started to taste blood after they returned. She does not have difficulty swallowing food but finds occasional difficulty while swallowing. She also mentions that her tonsil feel tender. Her voice also gets horse while having these episodes. She last took a Covid-19 test last year. She has not seen a dentist for 2.5 years. She was not exposed to HIV since she started to experience symptoms. She complains of gastric issues since her LAP-band procedure and gastric sleeve procedure. She has not seen her gastroenterologist to manage her symptoms. She continues to experience symptoms of depression. She has found improvement since switching to her new medication.   Past Medical History:  Diagnosis Date   Allergic rhinitis    Allergic rhinitis 01/25/2009        ANEMIA-IRON DEFICIENCY 01/25/2009   Anxiety    Backache 01/25/2009   Qualifier: Diagnosis of  By: Jenny Reichmann MD, Hunt Oris    Bipolar 1 disorder [facility])    Chronic pain syndrome    Complete rotator cuff tear 07/21/2013   Complete tear of right rotator cuff 07/21/2013   Contact lens/glasses fitting    DISC DISEASE,  LUMBAR 01/25/2009   Essential hypertension 88/03/8001   On Bystolic    GAD (generalized anxiety disorder) 12/28/2013   GERD (gastroesophageal reflux disease)    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   Headache, acute 12/20/2013   Hx of laparoscopic gastric banding 09/03/2010   Surgery date: 07/17/10    HYPERLIPIDEMIA 01/25/2009   Hyperlipidemia with target LDL less than 130 01/25/2009   HYPERTENSION 01/25/2009   Hypokalemia, inadequate intake 09/01/2011   Insomnia 04/04/2011   INSOMNIA-SLEEP DISORDER-UNSPEC 04/30/2009   Qualifier: Diagnosis of  By: Jenny Reichmann MD, Hunt Oris    Iron deficiency anemia 01/25/2009        Labyrinthitis 02/19/2014   Leiomyoma of uterus, unspecified 08/13/2008   Overview:  Leiomyoma Of The Uterus  10/1 IMO update   Localized edema 06/12/2015   MANIC DEPRESSIVE ILLNESS 01/25/2009   pt is unsue if this is her specifc dx   Mixed bipolar I disorder ([place]) 07/12/2012   Morbid obesity ([place]) 01/25/2009   Night sweats    Other abnormal glucose 10/26/2012   Palpitations 10/25/2013   PEPTIC ULCER DISEASE, HELICOBACTER PYLORI POSITIVE 10/03/2009   Peripheral edema 06/25/2015   Pernicious anemia 03/28/2012   Piriformis syndrome of both sides 09/02/2018   PUD (peptic ulcer disease) 10/03/2009        Right shoulder pain 05/03/2013   Dg Shoulder Right  05/03/2013   CLINICAL DATA Pain.  EXAM RIGHT SHOULDER - 2+ VIEW  COMPARISON None.  FINDINGS Acromioclavicular and glenohumeral degenerative change present. Questionable calcific density noted in the region of the supraspinatus space, possibly representing calcific supraspinatus tendinitis. This could represent a sclerotic density in the acromion. MRI of the right shoulder suggest   Sleep apnea 08/12/2016   SMOKER 01/25/2009   Qualifier: Diagnosis of  By: Jenny Reichmann MD, Hunt Oris    Subacromial bursitis 05/17/2013   SUBSTANCE ABUSE 11/06/2009        Thiamin deficiency 10/30/2012   Tobacco abuse 06/09/2010   Visual disturbance 05/03/2013   Vitamin D deficiency 10/27/2012     Past Surgical History:  Procedure Laterality Date   ABDOMINAL HYSTERECTOMY     BLADDER SURGERY     s/p with ?diverticulitis   HAND TENDON SURGERY  1991   s/p-Right-index and middle   LAPAROSCOPIC GASTRIC BAND REMOVAL WITH LAPAROSCOPIC GASTRIC SLEEVE RESECTION N/A 08/17/2016   Procedure: LAPAROSCOPIC GASTRIC BAND REMOVAL WITH LAPAROSCOPIC GASTRIC SLEEVE RESECTION WITH UPPER ENDO;  Surgeon: Alphonsa Overall, MD;  Location: [facility];  Service: General;  Laterality: N/A;   LAPAROSCOPIC GASTRIC BANDING  07/14/10   SHOULDER ARTHROSCOPY Right 07/21/2013   Procedure: RIGHT ARTHROSCOPY SHOULDER DEBRIDMENT EXTENTSIVE,ARTHROSCOPIC REMOVE LOOSE FOREIGN BODY, BICEPS TENOLYSIS ;  Surgeon: Johnny Bridge, MD;  Location: [facility];  Service: Orthopedics;  Laterality: Right;   TUBAL LIGATION      Family History  Problem Relation Age of Onset   Hypertension Mother    Asthma Mother    Stroke Father    Cirrhosis Father        ETOH   Hypertension Father    Diabetes Father    Alcohol abuse Father    Hypertension Sister    Asthma Sister    Hypertension Brother    ADD / ADHD Son    Hypertension Paternal Aunt    Diabetes Maternal Grandmother    ADD / ADHD Other        3 nephews, also emotional issues   Diabetes Other        Uncle   Diabetes Other        Aunt   Suicidality Neg Hx     Social History   Socioeconomic History   Marital status: Married    Spouse name: Not on file   Number of children: 2   Years of education: Not on file   Highest education level: Not on file  Occupational History   Occupation: STUDENT    Employer: UNEMPLOYED  Tobacco Use   Smoking status: Every Day    Packs/day: 0.25    Years: 20.00    Pack years: 5.00    Types: Cigarettes   Smokeless tobacco: Never   Tobacco comments:    States has cut back to 5-7 a day and working on this.   Vaping Use   Vaping Use: Never used  Substance and Sexual Activity   Alcohol use: No    Alcohol/week: 0.0  standard drinks   Drug use: No   Sexual activity: Not Currently  Other Topics Concern   Not on file  Social History Narrative   Moved from [place], then [place] to [place] 2009   2 children-1 boy, 1 girl   Disabled-bipolar   Daily Caffeine Use-1 cup/day   Social Determinants of Health   Financial Resource Strain: Not on file  Food Insecurity: Not on file  Transportation Needs: Not on file  Physical Activity: Not on file  Stress: Not on file  Social Connections: Not on file  Intimate  Partner Violence: Not on file    Outpatient Medications Prior to Visit  Medication Sig Dispense Refill   amLODipine (NORVASC) 5 MG tablet Take 1 tablet (5 mg total) by mouth daily. 30 tablet 11   ARIPiprazole (ABILIFY) 5 MG tablet Take 1 tablet (5 mg total) by mouth daily. 30 tablet 0   clonazePAM (KLONOPIN) 0.5 MG tablet Take 1 tablet (0.5 mg total) by mouth 3 (three) times daily as needed for anxiety. 90 tablet 0   FLUoxetine (PROZAC) 20 MG capsule Take 1 capsule (20 mg total) by mouth daily. 30 capsule 0   gabapentin (NEURONTIN) 100 MG capsule Take 1 capsule (100 mg total) by mouth 3 (three) times daily. 90 capsule 3   meloxicam (MOBIC) 15 MG tablet Take 1 tablet (15 mg total) by mouth daily as needed for pain. 30 tablet 2   Multiple Vitamin (MULTIVITAMIN) tablet Take 1 tablet by mouth daily. Reported on 09/09/2015     nystatin (MYCOSTATIN/NYSTOP) powder Use as directed twice per day as needed to affected area 45 g 2   QUEtiapine (SEROQUEL) 300 MG tablet Take 1 tablet (300 mg total) by mouth at bedtime. 30 tablet 0   thiamine ([employer] B-1) 100 MG tablet Take 1 tablet (100 mg total) by mouth daily. 30 tablet 3   Vitamin D, Ergocalciferol, (DRISDOL) 1.25 MG (50000 UNIT) CAPS capsule Take 1 capsule (50,000 Units total) by mouth every 7 (seven) days. 12 capsule 1   pantoprazole (PROTONIX) 40 MG tablet Take 1 tablet (40 mg total) by mouth daily. 30 tablet 11   atorvastatin (LIPITOR) 20 MG tablet Take 1  tablet (20 mg total) by mouth daily. (Patient not taking: No sig reported) 30 tablet 11   No facility-administered medications prior to visit.    Allergies  Allergen Reactions   Lamictal [Lamotrigine] Rash    Review of Systems  HENT:         (+)Difficulty swallowing. (+)Tonsillar tenderness (+)Occasional taste of blood (+)Mass that comes and goes on bilateral throat  Psychiatric/Behavioral:  Positive for depression.       Objective:    Physical Exam Constitutional:      General: She is not in acute distress.    Appearance: Normal appearance. She is not ill-appearing.  HENT:     Head: Normocephalic and atraumatic.     Right Ear: External ear normal.     Left Ear: External ear normal.     Mouth/Throat:     Comments: Thrush found on tongue.   Eyes:     Extraocular Movements: Extraocular movements intact.     Pupils: Pupils are equal, round, and reactive to light.  Cardiovascular:     Rate and Rhythm: Normal rate and regular rhythm.     Pulses: Normal pulses.     Heart sounds: Normal heart sounds. No murmur heard.   No gallop.  Pulmonary:     Effort: Pulmonary effort is normal. No respiratory distress.     Breath sounds: Normal breath sounds. No wheezing, rhonchi or rales.  Lymphadenopathy:     Cervical: No cervical adenopathy.  Skin:    General: Skin is warm and dry.  Neurological:     Mental Status: She is alert and oriented to person, place, and time.  Psychiatric:        Behavior: Behavior normal.    BP 130/80 (BP Location: Right Arm, Patient Position: Sitting, Cuff Size: Normal)   Pulse (!) 110   Temp 98.1 F (36.7 C) (Oral)  Resp 20   Ht 5\' 4"  (1.626 m)   Wt 277 lb 12.8 oz (126 kg)   SpO2 95%   BMI 47.68 kg/m  Wt Readings from Last 3 Encounters:  08/13/20 277 lb 12.8 oz (126 kg)  03/08/20 277 lb (125.6 kg)  11/22/19 261 lb (118.4 kg)    Diabetic Foot Exam - Simple   No data filed    Lab Results  Component Value Date   WBC 8.7 04/16/2020    HGB 12.8 04/16/2020   HCT 37.5 04/16/2020   PLT 295.0 04/16/2020   GLUCOSE 91 04/16/2020   CHOL 262 (H) 11/22/2019   TRIG 121 11/22/2019   HDL 57 11/22/2019   LDLDIRECT 195.6 10/26/2012   LDLCALC 180 (H) 11/22/2019   ALT 13 04/16/2020   AST 13 04/16/2020   NA 140 04/16/2020   K 3.3 (L) 04/16/2020   CL 102 04/16/2020   CREATININE 0.68 04/16/2020   BUN 12 04/16/2020   CO2 29 04/16/2020   TSH 3.18 04/16/2020   HGBA1C 6.1 (A) 03/08/2020    Lab Results  Component Value Date   TSH 3.18 04/16/2020   Lab Results  Component Value Date   WBC 8.7 04/16/2020   HGB 12.8 04/16/2020   HCT 37.5 04/16/2020   MCV 82.5 04/16/2020   PLT 295.0 04/16/2020   Lab Results  Component Value Date   NA 140 04/16/2020   K 3.3 (L) 04/16/2020   CO2 29 04/16/2020   GLUCOSE 91 04/16/2020   BUN 12 04/16/2020   CREATININE 0.68 04/16/2020   BILITOT 0.5 04/16/2020   ALKPHOS 111 04/16/2020   AST 13 04/16/2020   ALT 13 04/16/2020   PROT 7.9 04/16/2020   ALBUMIN 3.7 04/16/2020   CALCIUM 8.9 04/16/2020   ANIONGAP 6 08/12/2016   GFR 97.77 04/16/2020   Lab Results  Component Value Date   CHOL 262 (H) 11/22/2019   Lab Results  Component Value Date   HDL 57 11/22/2019   Lab Results  Component Value Date   LDLCALC 180 (H) 11/22/2019   Lab Results  Component Value Date   TRIG 121 11/22/2019   Lab Results  Component Value Date   CHOLHDL 4.6 11/22/2019   Lab Results  Component Value Date   HGBA1C 6.1 (A) 03/08/2020       Assessment & Plan:   Problem List Items Addressed This Visit       Unprioritized   Dysphagia - Primary    Inc protonix to bid and refer to GI        Relevant Medications   pantoprazole (PROTONIX) 40 MG tablet   Other Relevant Orders   CBC with Differential/Platelet   Comprehensive metabolic panel   Ambulatory referral to ENT   Ambulatory referral to Gastroenterology   Hoarseness    Refer to ent        Relevant Orders   Ambulatory referral to ENT    Thrush    Nystatin swish and spit ? Etiology Refer to ent       Relevant Medications   nystatin (MYCOSTATIN) 100000 UNIT/ML suspension   Other Relevant Orders   CBC with Differential/Platelet   Comprehensive metabolic panel   Ambulatory referral to ENT     Meds ordered this encounter  Medications   nystatin (MYCOSTATIN) 100000 UNIT/ML suspension    Sig: Take 5 mLs (500,000 Units total) by mouth 4 (four) times daily.    Dispense:  60 mL    Refill:  0  pantoprazole (PROTONIX) 40 MG tablet    Sig: Take 1 tablet (40 mg total) by mouth 2 (two) times daily before a meal.    Dispense:  60 tablet    Refill:  3    I, Dr. Roma Schanz, DO, personally preformed the services described in this documentation.  All medical record entries made by the scribe were at my direction and in my presence.  I have reviewed the chart and discharge instructions (if applicable) and agree that the record reflects my personal performance and is accurate and complete. 08/13/2020   I,Shannon [name],acting as a Education administrator for [employer], DO.,have documented all relevant documentation on the behalf of Ann Held, DO,as directed by  Ann Held, DO while in the presence of Ann Held, DO.   Ann Held, DO

## 2020-08-14 ENCOUNTER — Telehealth (HOSPITAL_COMMUNITY): Payer: Self-pay | Admitting: *Deleted

## 2020-08-14 NOTE — Telephone Encounter (Signed)
Writer placed call to pt as a reminder that she still has lab orders @ [facility]. And that we would like to have results prior to her next appointment on 08/22/20. Pt encouraged to call with any questions or concerns.

## 2020-08-15 ENCOUNTER — Other Ambulatory Visit: Payer: Self-pay | Admitting: Family

## 2020-08-15 DIAGNOSIS — E519 Thiamine deficiency, unspecified: Secondary | ICD-10-CM

## 2020-08-16 DIAGNOSIS — J3489 Other specified disorders of nose and nasal sinuses: Secondary | ICD-10-CM | POA: Diagnosis not present

## 2020-08-16 DIAGNOSIS — K118 Other diseases of salivary glands: Secondary | ICD-10-CM | POA: Diagnosis not present

## 2020-08-16 DIAGNOSIS — K219 Gastro-esophageal reflux disease without esophagitis: Secondary | ICD-10-CM | POA: Insufficient documentation

## 2020-08-16 NOTE — Telephone Encounter (Signed)
I have called pt and she stated that she no-showed the appointment because she was having a mental day. She stated that she wanted to go back to [place]. I have confirmed the upcoming appointment in [place]. She stated that she will be there.

## 2020-08-19 ENCOUNTER — Ambulatory Visit (INDEPENDENT_AMBULATORY_CARE_PROVIDER_SITE_OTHER): Payer: Medicare Other | Admitting: Psychiatry

## 2020-08-19 ENCOUNTER — Other Ambulatory Visit: Payer: Self-pay

## 2020-08-19 DIAGNOSIS — F319 Bipolar disorder, unspecified: Secondary | ICD-10-CM

## 2020-08-19 DIAGNOSIS — F4310 Post-traumatic stress disorder, unspecified: Secondary | ICD-10-CM

## 2020-08-19 NOTE — Progress Notes (Signed)
Virtual Visit via Video Note  I connected with Shannon [name] on 08/19/20 at  2:30 PM EDT by a video enabled telemedicine application and verified that I am speaking with the correct person using two identifiers.  Location: Patient: Patient Home Provider: Home Office   I discussed the limitations of evaluation and management by telemedicine and the availability of in person appointments. The patient expressed understanding and agreed to proceed.   History of Present Illness: Bipolar 1 DO, PTSD   Treatment Plan Goals: 1) Shannon [name] will take medication as prescribed 7 out of 7 days, and will communicate side effects with providers as issues arise to prevent regression and mania.  2) Shannon [name] will address significant debt from recent manic episode by establishing accountability and transparency of spending, online shop once weekly, return unused items and make financial decisions in collaboration with husband to reduce anxiety and depressive symptoms.  3) Shannon [name] would like to "heal inner child from childhood traumas", noting that she would like to be happier, more confident, engaged in life, forming healthy relationship with siblings, children, grandchildren, mother to improve daily functioning.   Observations/Objective: Counselor met with Client for individual therapy via Webex. Counselor assessed MH symptoms and progress on treatment plan goals, with Client reporting that she followed up with psychiatrist, discussed medication changes, filled prescription, taking medications and can tell her moods are stabilizing. Client reports more motivation, energy, communicating better with husband, taking care of ADLs more consistently and more hopeful about future. 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 Counselor prompted Client to discussed what was helpful and unhelpful  regarding most recent mental health crisis, in hopes to be preventative in the future. Counselor updates safety and crisis plan accordingly. Client presents with moderate depression and moderate anxiety. Client denied suicidal ideation or self-harm behaviors.    Counselor and Client spent time discussing updates, additions and revisions to CCA and treatment plan goals. Client reflects on amount of progress made over the past year, expressing appreciation of the work she has put into process. Counselor highlighted and and praised accomplishments and milestones in her care. Counselor to continue work with Client on understanding/applying mental health coping strategies, self-image, improving ADLs, processing childhood traumas and better management/prevention of mania. Client committed to treatment process and discussed benefits of lifestyle changes. No medication needs at this time, Client to follow up with provider once monthly to monitor medication changes.    Assessment and Plan: Counselor will continue to meet with patient to address treatment plan goals. Patient will continue to follow recommendations of providers and implement skills learned in session.   Follow Up Instructions: Counselor will send information for next session via Webex.   The patient was advised to call back or seek an in-person evaluation if the symptoms worsen or if the condition fails to improve as anticipated.   I provided 60 minutes of non-face-to-face time during this encounter.     Lise Auer, LCSW

## 2020-08-22 ENCOUNTER — Telehealth (INDEPENDENT_AMBULATORY_CARE_PROVIDER_SITE_OTHER): Payer: Medicare Other | Admitting: Psychiatry

## 2020-08-22 ENCOUNTER — Other Ambulatory Visit: Payer: Self-pay

## 2020-08-22 ENCOUNTER — Encounter (HOSPITAL_COMMUNITY): Payer: Self-pay | Admitting: Psychiatry

## 2020-08-22 VITALS — Wt 277.0 lb

## 2020-08-22 DIAGNOSIS — F319 Bipolar disorder, unspecified: Secondary | ICD-10-CM

## 2020-08-22 DIAGNOSIS — F609 Personality disorder, unspecified: Secondary | ICD-10-CM | POA: Diagnosis not present

## 2020-08-22 DIAGNOSIS — F4310 Post-traumatic stress disorder, unspecified: Secondary | ICD-10-CM | POA: Diagnosis not present

## 2020-08-22 MED ORDER — FLUOXETINE HCL 20 MG PO CAPS: 20.0000 mg | ORAL_CAPSULE | Freq: Every day | ORAL | 1 refills | Status: DC

## 2020-08-22 MED ORDER — QUETIAPINE FUMARATE 300 MG PO TABS: 300.0000 mg | ORAL_TABLET | Freq: Every day | ORAL | 1 refills | Status: DC

## 2020-08-22 MED ORDER — ARIPIPRAZOLE 5 MG PO TABS: 5.0000 mg | ORAL_TABLET | Freq: Every day | ORAL | 1 refills | Status: DC

## 2020-08-22 MED ORDER — CLONAZEPAM 0.5 MG PO TABS
0.5000 mg | ORAL_TABLET | Freq: Three times a day (TID) | ORAL | 1 refills | Status: DC | PRN
Start: 2020-08-22 — End: 2020-11-05
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
## 2020-08-28 ENCOUNTER — Encounter (HOSPITAL_COMMUNITY): Payer: Self-pay | Admitting: Psychiatry

## 2020-08-29 ENCOUNTER — Other Ambulatory Visit: Payer: Self-pay

## 2020-08-29 ENCOUNTER — Ambulatory Visit (INDEPENDENT_AMBULATORY_CARE_PROVIDER_SITE_OTHER): Payer: Medicare Other | Admitting: Psychiatry

## 2020-08-29 DIAGNOSIS — F319 Bipolar disorder, unspecified: Secondary | ICD-10-CM

## 2020-08-29 DIAGNOSIS — F4310 Post-traumatic stress disorder, unspecified: Secondary | ICD-10-CM | POA: Diagnosis not present

## 2020-08-30 ENCOUNTER — Encounter (HOSPITAL_COMMUNITY): Payer: Self-pay | Admitting: Psychiatry

## 2020-08-30 NOTE — Progress Notes (Signed)
Virtual Visit via Video Note  I connected with [name] on 08/29/20 at  1:30 PM EDT by a video enabled telemedicine application and verified that I am speaking with the correct person using two identifiers.  Location: Patient: Patient Home Provider: Home Office   I discussed the limitations of evaluation and management by telemedicine and the availability of in person appointments. The patient expressed understanding and agreed to proceed.   History of Present Illness: Bipolar 1 DO, PTSD   Treatment Plan Goals: 1) Suan will take medication as prescribed 7 out of 7 days, and will communicate side effects with providers as issues arise to prevent regression and mania.  2) Sherrell will address significant debt from recent manic episode by establishing accountability and transparency of spending, online shop once weekly, return unused items and make financial decisions in collaboration with husband to reduce anxiety and depressive symptoms.  3) Kaleah would like to "heal inner child from childhood traumas", noting that she would like to be happier, more confident, engaged in life, forming healthy relationship with siblings, children, grandchildren, mother to improve daily functioning.   Observations/Objective: Counselor met with Client for individual therapy via Webex. Counselor assessed MH symptoms and progress on treatment plan goals, with Client reporting that she is "currently overwhelmed, but in a good way", due to increased energy and motivation from recent medication changes. Client presents with mild depression and moderate anxiety. Client denied suicidal ideation or self-harm behaviors.    Goals 1-3) Counselor prompted Client to report on medication management, including any barriers to adherence, side effects and noted benefits. Client endorsed taking medication as prescribed 7 out of 7 days. Client noted that she is more motivated and energized. 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 Client noted that she is sleeping less than normal, eating more regularly, spending time with her husband and friends and is planning for the future- a drastic difference from most recent depressive episode. Counselor to monitor for manic symptoms and stabilization and communicate with provider. Counselor assessed progress on financial crisis and action plan to address needs in that area. As stated above, Client is now using items purchased for business, involving husband in decision making, has returned items and has booked events to sell products made. 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  Counselor prompted Client to share progress and self-work on goal 3, with Client sharing that she has communicated with siblings, reestablishing boundaries and willingness to communicate. Client and Counselor processed how to communicate with grandchildren's social worker about concerns with prospective adoptive family. Client noted she was tired and needed to rest, so we ended session earlier than normal. Client to report back at next session on action plans.    Assessment and Plan: Counselor will continue to meet with patient to address treatment plan goals. Patient will continue to follow recommendations of providers and implement skills learned in session.   Follow Up Instructions: Counselor will send information for next session via Webex.   The patient was advised to call back or seek an in-person evaluation if the symptoms worsen or if the condition fails to improve as anticipated.   I provided 45 minutes of non-face-to-face time during this encounter.     Lise Auer, LCSW

## 2020-09-07 ENCOUNTER — Other Ambulatory Visit: Payer: Self-pay | Admitting: Family

## 2020-09-09 ENCOUNTER — Encounter (HOSPITAL_COMMUNITY): Payer: Self-pay | Admitting: Psychiatry

## 2020-09-09 ENCOUNTER — Ambulatory Visit (INDEPENDENT_AMBULATORY_CARE_PROVIDER_SITE_OTHER): Payer: Medicare Other | Admitting: Psychiatry

## 2020-09-09 ENCOUNTER — Other Ambulatory Visit: Payer: Self-pay

## 2020-09-09 DIAGNOSIS — F319 Bipolar disorder, unspecified: Secondary | ICD-10-CM

## 2020-09-09 DIAGNOSIS — F4310 Post-traumatic stress disorder, unspecified: Secondary | ICD-10-CM

## 2020-09-09 NOTE — Progress Notes (Addendum)
Virtual Visit via Video Note  I connected with Margi N Rautio on 09/09/20 at  1:30 PM EDT by a video enabled telemedicine application and verified that I am speaking with the correct person using two identifiers.  Location: Patient: Patient Home Provider: Home Office   I discussed the limitations of evaluation and management by telemedicine and the availability of in person appointments. The patient expressed understanding and agreed to proceed.   History of Present Illness: Bipolar 1 DO, PTSD   Treatment Plan Goals: 1) Sabria will take medication as prescribed 7 out of 7 days, and will communicate side effects with providers as issues arise to prevent regression and mania.  2) Derrian will address significant debt from recent manic episode by establishing accountability and transparency of spending, online shop once weekly, return unused items and make financial decisions in collaboration with husband to reduce anxiety and depressive symptoms.  3) Fe would like to "heal inner child from childhood traumas", noting that she would like to be happier, more confident, engaged in life, forming healthy relationship with siblings, children, grandchildren, mother to improve daily functioning.   Observations/Objective: Counselor met with Client for individual therapy via Webex. Counselor assessed MH symptoms and progress on treatment plan goals, with Client reporting that she has been very busy and productive since our last session, seeing positive changes in mood, motivation, communication and reasoning. Client presents with mild depression and moderate anxiety. Client denied suicidal ideation or self-harm behaviors.    Goals 1-3) Counselor prompted Client to report on medication management, including any barriers to adherence, side effects and noted benefits. Client endorsed taking medication as prescribed 7 out of 7 days. Client noted that she is getting on a daily routine, sleeping through  the night and more energized during the day. Client appears to be stabilizing on medications, without manic symptoms. Client reports prioritizing health and wellness checks that she has neglected dental and oral hygiene over past 4 years. Client to follow up with procedures recommended  Counselor assessed progress on financial crisis and action plan to address needs in that area. Client reports that she is communicating and trusting her husband more effectively over the past few weeks to help her organize products and work items to be more productive. Client proud of how they are getting along and making a longer term plan to address financial crisis. Client is feeling more hopeful about outcomes and connection.  Counselor prompted Client to share progress and self-work on goal 3, with Client sharing that she reinforced boundaries with her mother when triggered recently. Client was proud of how she handled the situation and prevented spiraling emotionally. Counselor reviewed truama concepts and symptoms before closing session.    Assessment and Plan: Counselor will continue to meet with patient to address treatment plan goals. Patient will continue to follow recommendations of providers and implement skills learned in session.   Follow Up Instructions: Counselor will send information for next session via Webex.   The patient was advised to call back or seek an in-person evaluation if the symptoms worsen or if the condition fails to improve as anticipated.   I provided 45 minutes of non-face-to-face time during this encounter.      , LCSW    , LCSW  

## 2020-09-23 ENCOUNTER — Ambulatory Visit (INDEPENDENT_AMBULATORY_CARE_PROVIDER_SITE_OTHER): Payer: Medicare Other | Admitting: Psychiatry

## 2020-09-23 ENCOUNTER — Other Ambulatory Visit: Payer: Self-pay

## 2020-09-23 DIAGNOSIS — F4310 Post-traumatic stress disorder, unspecified: Secondary | ICD-10-CM | POA: Diagnosis not present

## 2020-09-23 DIAGNOSIS — F319 Bipolar disorder, unspecified: Secondary | ICD-10-CM | POA: Diagnosis not present

## 2020-09-23 NOTE — Progress Notes (Signed)
Virtual Visit via Video Note  I connected with Shannon [name] on 09/23/20 at  3:30 PM EDT by a video enabled telemedicine application and verified that I am speaking with the correct person using two identifiers.  Location: Patient: Patient Home Provider: Home Office   I discussed the limitations of evaluation and management by telemedicine and the availability of in person appointments. The patient expressed understanding and agreed to proceed.   History of Present Illness: Bipolar 1 DO, PTSD   Treatment Plan Goals: 1) Shannon [name] will take medication as prescribed 7 out of 7 days, and will communicate side effects with providers as issues arise to prevent regression and mania.  2) Shannon [name] will address significant debt from recent manic episode by establishing accountability and transparency of spending, online shop once weekly, return unused items and make financial decisions in collaboration with husband to reduce anxiety and depressive symptoms.  3) Shannon [name] would like to "heal inner child from childhood traumas", noting that she would like to be happier, more confident, engaged in life, forming healthy relationship with siblings, children, grandchildren, mother to improve daily functioning.   Observations/Objective: Counselor met with Client for individual therapy via Webex. Counselor assessed MH symptoms and progress on treatment plan goals, with Client reporting continued efforts to improve overall wellbeing and health. Client presents with mild depression and mild anxiety. Client denied suicidal ideation or self-harm behaviors.    Goals 1-3) Counselor prompted Client to report on medication management, including any barriers to adherence, side effects and noted benefits. Client endorsed taking medication as prescribed 7 out of 7 days. Client reports no issues with medications and no noted side effects at this time.   Counselor assessed progress on financial crisis and action plan to  address needs in that area. 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 Client noted shopping more affordability. Client and Counselor celebrated her recent success of driving to go shopping at local store on own. Client has not done this in over a year. Client continues to remain in dept, but is feeling more hopeful in addressing issue.    Counselor prompted Client to share progress and self-work on goal 3, with Client updating on communications with siblings and mother. Client communicating more directly and assertively about what she is willing to do and not do within the context of the relationship. Client notes reflecting on shift in perspective of who is family and what family means to her. Counselor noted a reduction in anxiety and concern on these matters/relationships over the past year. Client agreed and stated it has been emotionally freeing.    Assessment and Plan: Counselor will continue to meet with patient to address treatment plan goals. Patient will continue to follow recommendations of providers and implement skills learned in session.   Follow Up Instructions: Counselor will send information for next session via Webex.   The patient was advised to call back or seek an in-person evaluation if the symptoms worsen or if the condition fails to improve as anticipated.   I provided 60 minutes of non-face-to-face time during this encounter.     Lise Auer, LCSW

## 2020-09-27 ENCOUNTER — Encounter (HOSPITAL_COMMUNITY): Payer: Self-pay | Admitting: Psychiatry

## 2020-10-09 ENCOUNTER — Other Ambulatory Visit (HOSPITAL_COMMUNITY): Payer: Self-pay | Admitting: Psychiatry

## 2020-10-09 DIAGNOSIS — F319 Bipolar disorder, unspecified: Secondary | ICD-10-CM

## 2020-10-10 ENCOUNTER — Other Ambulatory Visit (HOSPITAL_COMMUNITY): Payer: Self-pay | Admitting: *Deleted

## 2020-10-10 DIAGNOSIS — F319 Bipolar disorder, unspecified: Secondary | ICD-10-CM

## 2020-10-10 MED ORDER — QUETIAPINE FUMARATE 300 MG PO TABS
300.0000 mg | ORAL_TABLET | Freq: Every day | ORAL | 0 refills | Status: DC
Start: 2020-10-10 — End: 2020-10-24

## 2020-10-14 ENCOUNTER — Ambulatory Visit (INDEPENDENT_AMBULATORY_CARE_PROVIDER_SITE_OTHER): Payer: Medicare Other | Admitting: Psychiatry

## 2020-10-14 ENCOUNTER — Encounter (HOSPITAL_COMMUNITY): Payer: Self-pay | Admitting: Psychiatry

## 2020-10-14 ENCOUNTER — Other Ambulatory Visit: Payer: Self-pay

## 2020-10-14 DIAGNOSIS — F4310 Post-traumatic stress disorder, unspecified: Secondary | ICD-10-CM | POA: Diagnosis not present

## 2020-10-14 DIAGNOSIS — F319 Bipolar disorder, unspecified: Secondary | ICD-10-CM | POA: Diagnosis not present

## 2020-10-14 NOTE — Progress Notes (Signed)
Virtual Visit via Video Note  I connected with Shannon [name] on 10/14/20 at  1:30 PM EDT by a video enabled telemedicine application and verified that I am speaking with the correct person using two identifiers.  Location: Patient: Patient Home Provider: Home Office   I discussed the limitations of evaluation and management by telemedicine and the availability of in person appointments. The patient expressed understanding and agreed to proceed.   History of Present Illness: Bipolar 1 DO, PTSD   Treatment Plan Goals: 1) Shannon [name] will take medication as prescribed 7 out of 7 days, and will communicate side effects with providers as issues arise to prevent regression and mania.  2) Shannon [name] will address significant debt from recent manic episode by establishing accountability and transparency of spending, online shop once weekly, return unused items and make financial decisions in collaboration with husband to reduce anxiety and depressive symptoms.  3) Shannon [name] would like to "heal inner child from childhood traumas", noting that she would like to be happier, more confident, engaged in life, forming healthy relationship with siblings, children, grandchildren, mother to improve daily functioning.   Observations/Objective: Counselor met with Client for individual therapy via Webex. Counselor assessed MH symptoms and progress on treatment plan goals, with Client reporting mental health and moods stable, with moderate energy and motivation to complete daily tasks. Client presents with mild depression and mild anxiety. Client denied suicidal ideation or self-harm behaviors.    Goals 1-3) Counselor prompted Client to report on medication management, including any barriers to adherence, side effects and noted benefits. Client continues to endorse taking medication as prescribed 7 out of 7 days. Client reports no issues with medications and no noted problematic side effects at this time.   Counselor  assessed progress on financial crisis and action plan to address needs in that area. Client states that she has taken steps to address debt that is shared with husbands accounts. Client disclosed high levels of debt on her credit that she hopes to address month to month. Client reports feeling overwhelmed when thinking about amount. Counselor discussed negative impacts of avoidant behaviors on anxiety and in alleviating the core issue. Counselor and Client discussed fears and an action plan to reduce spending habits and generate income. Client motivated to change and address.    Counselor prompted Client to share progress and self-work on goal 3, with Client updating on communications with siblings and mother. Client became triggered and teary when discussing family. Client states that she makes attempts to keep mind off of family trauma and relationships unless in session, as it is overwhelming to process alone. Counselor reviewed boundary setting and shared psychoeducation of healing from childhood traumas. Client able to identify protective and resiliency factors. Counselor and Client did grounding skills before ending session to regulate emotions.    Assessment and Plan: Counselor will continue to meet with patient to address treatment plan goals. Patient will continue to follow recommendations of providers and implement skills learned in session.   Follow Up Instructions: Counselor will send information for next session via Webex.   The patient was advised to call back or seek an in-person evaluation if the symptoms worsen or if the condition fails to improve as anticipated.   I provided 60 minutes of non-face-to-face time during this encounter.     Lise Auer, LCSW

## 2020-10-18 ENCOUNTER — Telehealth (HOSPITAL_COMMUNITY): Payer: Medicare Other | Admitting: Psychiatry

## 2020-10-18 ENCOUNTER — Other Ambulatory Visit: Payer: Self-pay

## 2020-10-20 ENCOUNTER — Telehealth: Payer: Self-pay

## 2020-10-20 NOTE — Telephone Encounter (Signed)
Spoke with the patient and she stated that she would not be able to come to her appointment scheduled originally 10/21/2020 at 8:40 am with Dr. Sharlet Salina, she would like to come in Tuesday morning. I don't have clearance to override a session limit.

## 2020-10-21 ENCOUNTER — Encounter: Payer: Medicare Other | Admitting: Internal Medicine

## 2020-10-22 ENCOUNTER — Telehealth: Payer: Medicare Other | Admitting: Internal Medicine

## 2020-10-24 ENCOUNTER — Other Ambulatory Visit (HOSPITAL_COMMUNITY): Payer: Self-pay | Admitting: *Deleted

## 2020-10-24 DIAGNOSIS — F4310 Post-traumatic stress disorder, unspecified: Secondary | ICD-10-CM

## 2020-10-24 DIAGNOSIS — F319 Bipolar disorder, unspecified: Secondary | ICD-10-CM

## 2020-10-24 MED ORDER — ARIPIPRAZOLE 5 MG PO TABS
5.0000 mg | ORAL_TABLET | Freq: Every day | ORAL | 0 refills | Status: DC
Start: 2020-10-24 — End: 2020-11-05

## 2020-10-24 MED ORDER — FLUOXETINE HCL 20 MG PO CAPS: 20.0000 mg | ORAL_CAPSULE | Freq: Every day | ORAL | 0 refills | Status: DC

## 2020-10-24 MED ORDER — QUETIAPINE FUMARATE 300 MG PO TABS: 300.0000 mg | ORAL_TABLET | Freq: Every day | ORAL | 0 refills | Status: DC

## 2020-10-29 ENCOUNTER — Telehealth: Payer: Medicare Other | Admitting: Internal Medicine

## 2020-10-31 ENCOUNTER — Ambulatory Visit (INDEPENDENT_AMBULATORY_CARE_PROVIDER_SITE_OTHER): Payer: Medicare Other | Admitting: Psychiatry

## 2020-10-31 ENCOUNTER — Other Ambulatory Visit: Payer: Self-pay

## 2020-10-31 DIAGNOSIS — F4310 Post-traumatic stress disorder, unspecified: Secondary | ICD-10-CM

## 2020-10-31 DIAGNOSIS — F319 Bipolar disorder, unspecified: Secondary | ICD-10-CM

## 2020-11-01 ENCOUNTER — Encounter (HOSPITAL_COMMUNITY): Payer: Self-pay | Admitting: Psychiatry

## 2020-11-01 NOTE — Progress Notes (Signed)
Virtual Visit via Video Note  I connected with Shannon [name] on 11/01/20 at  2:00 PM EDT by a video enabled telemedicine application and verified that I am speaking with the correct person using two identifiers.  Location: Patient: Patient Home Provider: Home Office   I discussed the limitations of evaluation and management by telemedicine and the availability of in person appointments. The patient expressed understanding and agreed to proceed.   History of Present Illness: Bipolar 1 DO, PTSD   Treatment Plan Goals: 1) Shannon [name] will take medication as prescribed 7 out of 7 days, and will communicate side effects with providers as issues arise to prevent regression and mania.  2) Shannon [name] will address significant debt from recent manic episode by establishing accountability and transparency of spending, online shop once weekly, return unused items and make financial decisions in collaboration with husband to reduce anxiety and depressive symptoms.  3) Shannon [name] would like to "heal inner child from childhood traumas", noting that she would like to be happier, more confident, engaged in life, forming healthy relationship with siblings, children, grandchildren, mother to improve daily functioning.   Observations/Objective: Counselor met with Client for individual therapy via Webex. Counselor assessed MH symptoms and progress on treatment plan goals, with Client reporting that she was experiencing withdraws from abruptly ending medications, because she ran out and has unsuccessfully been able to receive refills. Client presents with moderate depression and moderate anxiety. Client denied suicidal ideation or self-harm behaviors.    Goals 1-3) Counselor prompted Client to report on medication management, including any barriers to adherence, side effects and noted benefits. Client continues to endorse taking medication as prescribed 7 out of 7 days. Client states that she ran out of 3 of her behavioral  health medications 2 weeks ago. She noted increased agitation, irritability, sleep issues, stomach issues, lack of motivation and energy. Counselor took note of issues and will follow up with provider and nurse staff when session ends. Counselor and Client discussed management of symptoms, follow body cues and communicating needs with support system. Client states she is appalled that this has occurred and expressed her frustrations about having to reset with medications. Client noted current prescriptions and treatment is greatly helping her stabilization, ADLs and motivations.    Counselor assessed progress on financial crisis and action plan to address needs in that area. 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 Counselor and Client discussed motivation strategies for overcoming anxiety associated with the event. Client noted feeling more capable and motivated to engage this weekend in sale.    Counselor prompted Client to share progress and self-work on goal 3, with Client updating on communications with siblings and mother. Due to current mental and physical state Client declined to work on this goal today.     Assessment and Plan: Counselor will continue to meet with patient to address treatment plan goals. Patient will continue to follow recommendations of providers and implement skills learned in session.   Follow Up Instructions: Counselor will send information for next session via Webex.   The patient was advised to call back or seek an in-person evaluation if the symptoms worsen or if the condition fails to improve as anticipated.   I provided 40 minutes of non-face-to-face time during this encounter.     Lise Auer, LCSW

## 2020-11-05 ENCOUNTER — Telehealth (INDEPENDENT_AMBULATORY_CARE_PROVIDER_SITE_OTHER): Payer: Medicare Other | Admitting: Psychiatry

## 2020-11-05 ENCOUNTER — Encounter (HOSPITAL_COMMUNITY): Payer: Self-pay | Admitting: Psychiatry

## 2020-11-05 ENCOUNTER — Other Ambulatory Visit: Payer: Self-pay

## 2020-11-05 VITALS — Wt 280.0 lb

## 2020-11-05 DIAGNOSIS — F4310 Post-traumatic stress disorder, unspecified: Secondary | ICD-10-CM | POA: Diagnosis not present

## 2020-11-05 DIAGNOSIS — F319 Bipolar disorder, unspecified: Secondary | ICD-10-CM

## 2020-11-05 DIAGNOSIS — F609 Personality disorder, unspecified: Secondary | ICD-10-CM

## 2020-11-05 MED ORDER — CLONAZEPAM 0.5 MG PO TABS: 0.5000 mg | ORAL_TABLET | Freq: Three times a day (TID) | ORAL | 2 refills | Status: DC | PRN

## 2020-11-05 MED ORDER — ARIPIPRAZOLE 5 MG PO TABS: 5.0000 mg | ORAL_TABLET | Freq: Every day | ORAL | 2 refills | Status: DC

## 2020-11-05 MED ORDER — FLUOXETINE HCL 20 MG PO CAPS: 20.0000 mg | ORAL_CAPSULE | Freq: Every day | ORAL | 2 refills | Status: DC

## 2020-11-05 MED ORDER — QUETIAPINE FUMARATE 300 MG PO TABS: 300.0000 mg | ORAL_TABLET | Freq: Every day | ORAL | 2 refills | Status: DC
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
## 2020-12-17 ENCOUNTER — Other Ambulatory Visit: Payer: Self-pay | Admitting: Family

## 2020-12-31 ENCOUNTER — Other Ambulatory Visit: Payer: Self-pay | Admitting: Family

## 2021-01-01 DIAGNOSIS — Z23 Encounter for immunization: Secondary | ICD-10-CM | POA: Diagnosis not present

## 2021-01-20 ENCOUNTER — Other Ambulatory Visit: Payer: Self-pay

## 2021-01-20 ENCOUNTER — Encounter (HOSPITAL_COMMUNITY): Payer: Self-pay | Admitting: Psychiatry

## 2021-01-20 ENCOUNTER — Telehealth (HOSPITAL_BASED_OUTPATIENT_CLINIC_OR_DEPARTMENT_OTHER): Payer: Medicare Other | Admitting: Psychiatry

## 2021-01-20 VITALS — Wt 280.0 lb

## 2021-01-20 DIAGNOSIS — F4310 Post-traumatic stress disorder, unspecified: Secondary | ICD-10-CM

## 2021-01-20 DIAGNOSIS — F609 Personality disorder, unspecified: Secondary | ICD-10-CM | POA: Diagnosis not present

## 2021-01-20 DIAGNOSIS — F319 Bipolar disorder, unspecified: Secondary | ICD-10-CM | POA: Diagnosis not present

## 2021-01-20 MED ORDER — FLUOXETINE HCL 20 MG PO CAPS: 20.0000 mg | ORAL_CAPSULE | Freq: Every day | ORAL | 2 refills | Status: DC

## 2021-01-20 MED ORDER — ARIPIPRAZOLE 5 MG PO TABS: 5.0000 mg | ORAL_TABLET | Freq: Every day | ORAL | 2 refills | Status: DC

## 2021-01-20 MED ORDER — CLONAZEPAM 0.5 MG PO TABS: 0.5000 mg | ORAL_TABLET | Freq: Three times a day (TID) | ORAL | 2 refills | Status: DC | PRN

## 2021-01-20 MED ORDER — QUETIAPINE FUMARATE 300 MG PO TABS: 300.0000 mg | ORAL_TABLET | Freq: Every day | ORAL | 2 refills | Status: DC
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
## 2021-02-03 ENCOUNTER — Telehealth (HOSPITAL_COMMUNITY): Payer: Medicare Other | Admitting: Psychiatry

## 2021-04-14 ENCOUNTER — Other Ambulatory Visit: Payer: Self-pay | Admitting: Family Medicine

## 2021-04-14 DIAGNOSIS — R1310 Dysphagia, unspecified: Secondary | ICD-10-CM

## 2021-04-21 ENCOUNTER — Other Ambulatory Visit: Payer: Self-pay

## 2021-04-21 ENCOUNTER — Telehealth (HOSPITAL_BASED_OUTPATIENT_CLINIC_OR_DEPARTMENT_OTHER): Payer: Medicare Other | Admitting: Psychiatry

## 2021-04-21 DIAGNOSIS — F609 Personality disorder, unspecified: Secondary | ICD-10-CM

## 2021-04-21 DIAGNOSIS — F319 Bipolar disorder, unspecified: Secondary | ICD-10-CM | POA: Diagnosis not present

## 2021-04-21 DIAGNOSIS — F4310 Post-traumatic stress disorder, unspecified: Secondary | ICD-10-CM

## 2021-04-21 MED ORDER — QUETIAPINE FUMARATE 300 MG PO TABS: 300.0000 mg | ORAL_TABLET | Freq: Every day | ORAL | 1 refills | Status: DC

## 2021-04-21 MED ORDER — ARIPIPRAZOLE 5 MG PO TABS: 5.0000 mg | ORAL_TABLET | Freq: Every day | ORAL | 1 refills | Status: DC

## 2021-04-21 MED ORDER — FLUOXETINE HCL 20 MG PO CAPS: 20.0000 mg | ORAL_CAPSULE | Freq: Every day | ORAL | 1 refills | Status: DC

## 2021-04-21 MED ORDER — CLONAZEPAM 0.5 MG PO TABS: 0.5000 mg | ORAL_TABLET | Freq: Three times a day (TID) | ORAL | 1 refills | Status: DC | PRN
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
## 2021-04-22 ENCOUNTER — Other Ambulatory Visit: Payer: Self-pay

## 2021-04-22 ENCOUNTER — Ambulatory Visit (INDEPENDENT_AMBULATORY_CARE_PROVIDER_SITE_OTHER): Payer: Medicare Other | Admitting: Clinical

## 2021-04-22 DIAGNOSIS — F319 Bipolar disorder, unspecified: Secondary | ICD-10-CM

## 2021-04-22 DIAGNOSIS — F609 Personality disorder, unspecified: Secondary | ICD-10-CM

## 2021-04-22 DIAGNOSIS — F4310 Post-traumatic stress disorder, unspecified: Secondary | ICD-10-CM
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
## 2021-04-22 NOTE — Plan of Care (Signed)
Pt will develop coping strategies for mood management as evidenced by journaling 1/7 days per week, minimum 1 journal entry. Pt participated in completion of treatment plan.

## 2021-05-14 ENCOUNTER — Ambulatory Visit (INDEPENDENT_AMBULATORY_CARE_PROVIDER_SITE_OTHER): Payer: Medicare Other | Admitting: Family Medicine

## 2021-05-14 ENCOUNTER — Encounter: Payer: Self-pay | Admitting: Family Medicine

## 2021-05-14 VITALS — BP 152/84 | HR 102 | Temp 98.3°F | Ht 64.0 in | Wt 287.8 lb

## 2021-05-14 DIAGNOSIS — E538 Deficiency of other specified B group vitamins: Secondary | ICD-10-CM | POA: Diagnosis not present

## 2021-05-14 DIAGNOSIS — Z720 Tobacco use: Secondary | ICD-10-CM

## 2021-05-14 DIAGNOSIS — R5383 Other fatigue: Secondary | ICD-10-CM

## 2021-05-14 DIAGNOSIS — E559 Vitamin D deficiency, unspecified: Secondary | ICD-10-CM

## 2021-05-14 DIAGNOSIS — R202 Paresthesia of skin: Secondary | ICD-10-CM | POA: Diagnosis not present

## 2021-05-14 DIAGNOSIS — I10 Essential (primary) hypertension: Secondary | ICD-10-CM

## 2021-05-14 DIAGNOSIS — R200 Anesthesia of skin: Secondary | ICD-10-CM

## 2021-05-14 DIAGNOSIS — E785 Hyperlipidemia, unspecified: Secondary | ICD-10-CM

## 2021-05-14 DIAGNOSIS — Z7689 Persons encountering health services in other specified circumstances: Secondary | ICD-10-CM

## 2021-05-14 DIAGNOSIS — R7303 Prediabetes: Secondary | ICD-10-CM

## 2021-05-14 DIAGNOSIS — E041 Nontoxic single thyroid nodule: Secondary | ICD-10-CM | POA: Diagnosis not present

## 2021-05-14 DIAGNOSIS — F3160 Bipolar disorder, current episode mixed, unspecified: Secondary | ICD-10-CM

## 2021-05-14 DIAGNOSIS — R221 Localized swelling, mass and lump, neck: Secondary | ICD-10-CM

## 2021-05-14 LAB — COMPREHENSIVE METABOLIC PANEL
ALT: 11 U/L (ref 0–35)
AST: 15 U/L (ref 0–37)
Albumin: 3.7 g/dL (ref 3.5–5.2)
Alkaline Phosphatase: 146 U/L — ABNORMAL HIGH (ref 39–117)
BUN: 7 mg/dL (ref 6–23)
CO2: 33 mEq/L — ABNORMAL HIGH (ref 19–32)
Calcium: 8.7 mg/dL (ref 8.4–10.5)
Chloride: 101 mEq/L (ref 96–112)
Creatinine, Ser: 0.82 mg/dL (ref 0.40–1.20)
GFR: 79.7 mL/min (ref >60.00)
Glucose, Bld: 91 mg/dL (ref 70–99)
Potassium: 3.1 mEq/L — ABNORMAL LOW (ref 3.5–5.1)
Sodium: 142 mEq/L (ref 135–145)
Total Bilirubin: 0.5 mg/dL (ref 0.2–1.2)
Total Protein: 7.9 g/dL (ref 6.0–8.3)

## 2021-05-14 LAB — LIPID PANEL
Cholesterol: 244 mg/dL — ABNORMAL HIGH (ref 0–200)
HDL: 51.6 mg/dL (ref >39.00)
LDL Cholesterol: 160 mg/dL — ABNORMAL HIGH (ref 0–99)
NonHDL: 192.42
Total CHOL/HDL Ratio: 5
Triglycerides: 164 mg/dL — ABNORMAL HIGH (ref 0.0–149.0)
VLDL: 32.8 mg/dL (ref 0.0–40.0)

## 2021-05-14 LAB — CBC
HCT: 37.5 % (ref 36.0–46.0)
Hemoglobin: 12.2 g/dL (ref 12.0–15.0)
MCHC: 32.4 g/dL (ref 30.0–36.0)
MCV: 77.2 fl — ABNORMAL LOW (ref 78.0–100.0)
Platelets: 312 10*3/uL (ref 150.0–400.0)
RBC: 4.86 Mil/uL (ref 3.87–5.11)
RDW: 16.6 % — ABNORMAL HIGH (ref 11.5–15.5)
WBC: 8.4 10*3/uL (ref 4.0–10.5)

## 2021-05-14 LAB — T4, FREE: Free T4: 0.67 ng/dL (ref 0.60–1.60)

## 2021-05-14 LAB — HEMOGLOBIN A1C: Hgb A1c MFr Bld: 6.1 % (ref 4.6–6.5)

## 2021-05-14 LAB — VITAMIN B12: Vitamin B-12: 365 pg/mL (ref 211–911)

## 2021-05-14 LAB — TSH: TSH: 4 u[IU]/mL (ref 0.35–5.50)

## 2021-05-14 LAB — VITAMIN D 25 HYDROXY (VIT D DEFICIENCY, FRACTURES): VITD: 32.59 ng/mL (ref 30.00–100.00)

## 2021-05-14 MED ORDER — AMLODIPINE BESYLATE 5 MG PO TABS: 5.0000 mg | ORAL_TABLET | Freq: Every day | ORAL | 2 refills | Status: DC

## 2021-05-14 NOTE — Patient Instructions (Signed)
Please go to the lab on the first floor before leaving today. ? ?You should receive a call from [facility] imaging to schedule ultrasound of your thyroid and the mass on the left side of your neck. ? ?Start back on the blood pressure medication, amlodipine.  I sent this prescription to your pharmacy. ? ?Cut back on foods high in sodium. ? ?We will be in touch with your lab results and further recommendations. ?

## 2021-05-14 NOTE — Progress Notes (Signed)
? ?Subjective:  ? ? Patient ID: Shannon [name], female    DOB: 1965/04/07, 56 y.o.   MRN: [id number] ? ?HPI ?Chief Complaint  ?Patient presents with  ? Establish Care  ?  Neck swelling for about a year now. Pt is also requesting labs as she thinks she has diabetes. She notes that her toes have been numb for about a year. She does have depression and states it has been a while since she has been to the Dr.  ? ?She is new to the practice and here to establish care.  ?Previous PCP: Jodi Mourning, NP/Dr. Etter Sjogren  ? ?Other providers:  ?Dr. Adele Schilder- psychiatrist  ?Counselor  ?[facility] ?Dr. Willis Modena- OB/GYN ? ?She has several concerns  ? ?HTN- has not been taking amlodipine for over a year.  ? ?HL- has not been taking statin  ? ?Denies fever, chills, dizziness, chest pain, palpitations, shortness of breath, abdominal pain, N/V/D, urinary symptoms, LE edema.  ? ? ?States she has been dealing with severe depression and spends most of her days in the bed. She is improving as of recently. Coming to this appointment was a big deal per patient.  ? ?Complains of anterior neck swelling. States she has had a lump in her neck for the past year.  ?Lately noticed trouble swallowing  ?No trouble breathing  ? ?States she has a history of prediabetes and has not followed up. Concerned that she has diabetes.  ?States she eats a lot of salted sunflower seeds and drinks sweet tea.  ?Eats meat once a week.  ? ?Numbness in her toes for approximately one year.  ? ?Vitamin D def- is not taking a supplement  ?Hx of B12 deficiency and is not taking a supplement.  ? ?Smokes - 1/2 pack/day. Started at age 22  ?Does not drink alcohol.  ? ?Partial hysterectomy.  ? ?Married.  ? ?Reviewed allergies, medications, past medical, surgical, family, and social history. ? ? ? ? ?Review of Systems ?Pertinent positives and negatives in the history of present illness. ? ?   ?Objective:  ? Physical Exam ?Constitutional:   ?   General: She is not in acute  distress. ?Eyes:  ?   Extraocular Movements: Extraocular movements intact.  ?   Pupils: Pupils are equal, round, and reactive to light.  ?Neck:  ?   Thyroid: Thyroid mass (left thyroid nodule palpated) present.  ?   Trachea: Trachea normal.  ?   Comments: Left anterior palpable mass, tender to palpation ?Cardiovascular:  ?   Rate and Rhythm: Normal rate and regular rhythm.  ?   Pulses: Normal pulses.  ?Pulmonary:  ?   Effort: Pulmonary effort is normal.  ?   Breath sounds: Normal breath sounds.  ?Musculoskeletal:  ?   Cervical back: Normal range of motion and neck supple.  ?Lymphadenopathy:  ?   Cervical: No cervical adenopathy.  ?Skin: ?   General: Skin is warm and dry.  ?Neurological:  ?   General: No focal deficit present.  ?   Mental Status: She is alert.  ?Psychiatric:     ?   Mood and Affect: Mood normal.     ?   Behavior: Behavior normal.  ? ?BP (!) 152/84 (BP Location: Left Arm, Patient Position: Sitting, Cuff Size: Large)   Pulse (!) 102   Temp 98.3 ?F (36.8 ?C) (Oral)   Ht '5\' 4"'$  (1.626 m)   Wt 287 lb 12.8 oz (130.5 kg)   SpO2 93%  BMI 49.40 kg/m?  ? ? ? ? ?   ?Assessment & Plan:  ?Prediabetes - Plan: Hemoglobin A1c, Comprehensive metabolic panel, Comprehensive metabolic panel, Hemoglobin A1c ?-follow up pending A1c result and other labs ? ?Encounter to establish care ? ?Other fatigue - Plan: Comprehensive metabolic panel, CBC, Vitamin B12, TSH, VITAMIN D 25 Hydroxy (Vit-D Deficiency, Fractures), VITAMIN D 25 Hydroxy (Vit-D Deficiency, Fractures), TSH, Vitamin B12, CBC, Comprehensive metabolic panel ?-check labs and look for underlying etiology  ? ?Vitamin D deficiency - Plan: VITAMIN D 25 Hydroxy (Vit-D Deficiency, Fractures), VITAMIN D 25 Hydroxy (Vit-D Deficiency, Fractures) ?-start back on vitamin D supplement as appropriate  ? ?Vitamin B12 deficiency - Plan: Vitamin B12, Vitamin B12 ?-start back on vitamin B12 deficiency as appropriate  ? ?Numbness and tingling of both lower extremities - Plan:  Vitamin B12, TSH, T4, free, T4, free, TSH, Vitamin B12 ? ?Hyperlipidemia with target LDL less than 130 - Plan: Lipid panel, Lipid panel ?-check lipid panel and start back on statin  ? ?Mixed bipolar I disorder ([place]) ?-continue close follow up with counselor and psychiatrist  ? ?Neck mass - Plan: US Soft Tissue Head/Neck (NON-THYROID) ?-follow up pending [name] ? ?Morbid obesity ([place]) - Plan: TSH, T4, free, T4, free, TSH ?-limited activity and unhealthy diet. Make healthy changes as tolerated ?She admits her mental health has interfered with diet and exercise.  ? ?Essential hypertension - Plan: amLODipine (NORVASC) 5 MG tablet, Comprehensive metabolic panel, CBC, CBC, Comprehensive metabolic panel ?-start back on amlodipine. Low sodium diet recommended. Follow up pending labs.  ? ?Tobacco abuse ?-recommend smoking cessation  ? ?Thyroid nodule - Plan: US THYROID ?-follow up pending [name] and labs ? ? ?

## 2021-05-15 ENCOUNTER — Encounter: Payer: Self-pay | Admitting: Family Medicine

## 2021-05-15 ENCOUNTER — Telehealth: Payer: Self-pay

## 2021-05-15 ENCOUNTER — Other Ambulatory Visit: Payer: Self-pay | Admitting: Family Medicine

## 2021-05-15 DIAGNOSIS — E876 Hypokalemia: Secondary | ICD-10-CM

## 2021-05-15 DIAGNOSIS — E785 Hyperlipidemia, unspecified: Secondary | ICD-10-CM

## 2021-05-15 MED ORDER — ATORVASTATIN CALCIUM 20 MG PO TABS: 20.0000 mg | ORAL_TABLET | Freq: Every day | ORAL | 1 refills | Status: DC

## 2021-05-15 MED ORDER — POTASSIUM CHLORIDE CRYS ER 10 MEQ PO TBCR
10.0000 meq | EXTENDED_RELEASE_TABLET | Freq: Every day | ORAL | 0 refills | Status: DC
Start: 2021-05-15 — End: 2021-06-10

## 2021-05-15 NOTE — Telephone Encounter (Signed)
Pt is calling for a return call for results. I advised pt that the Provider hasn't viewed and entered result note as of yet. ? ?FYI ?

## 2021-05-15 NOTE — Progress Notes (Signed)
Please let her know that her potassium is low so I will send in a prescription for potassium for her to take daily for the next week. I will also refill her cholesterol medication since her cholesterol is elevated.  ?No sign of diabetes, she is in the prediabetes range which is good news.  ?I recommend taking a multivitamin such as Women's One A Day which will help with her vitamin D and B12 along with other minerals and vitamins. Follow up with me in 6 weeks or sooner if needed. PLEASE COME IN FASTING so I can recheck her cholesterol after being on the medication.

## 2021-05-16 ENCOUNTER — Ambulatory Visit (INDEPENDENT_AMBULATORY_CARE_PROVIDER_SITE_OTHER): Payer: Medicare Other | Admitting: Clinical

## 2021-05-16 ENCOUNTER — Other Ambulatory Visit: Payer: Self-pay

## 2021-05-16 DIAGNOSIS — F4310 Post-traumatic stress disorder, unspecified: Secondary | ICD-10-CM | POA: Diagnosis not present

## 2021-05-16 DIAGNOSIS — F609 Personality disorder, unspecified: Secondary | ICD-10-CM | POA: Diagnosis not present

## 2021-05-16 DIAGNOSIS — F319 Bipolar disorder, unspecified: Secondary | ICD-10-CM

## 2021-05-16 NOTE — Progress Notes (Signed)
? ?THERAPIST PROGRESS NOTE ? ?Session Time: 9am ? ?Participation Level: Active ? ?Behavioral Response: CasualAlertDysphoric ? ?Type of Therapy: Individual Therapy ? ?Treatment Goals addressed: Healthy coping methods ? ?ProgressTowards Goals: Initial ? ?Interventions: Other: psychoeducation ?Virtual Visit via Video Note ? ?I connected with Shannon [name] on 05/16/21 at  9:00 AM EDT by a video enabled telemedicine application and verified that I am speaking with the correct person using two identifiers. ? ?Location: ?Patient: home ?Provider: office ?  ?I discussed the limitations of evaluation and management by telemedicine and the availability of in person appointments. The patient expressed understanding and agreed to proceed. ?  ?I discussed the assessment and treatment plan with the patient. The patient was provided an opportunity to ask questions and all were answered. The patient agreed with the plan and demonstrated an understanding of the instructions. ?  ?The patient was advised to call back or seek an in-person evaluation if the symptoms worsen or if the condition fails to improve as anticipated. ? ?I provided 45 minutes of non-face-to-face time during this encounter.  ?Summary: Pt presents in dysphoric mood. Pt states "Its a bad day" and states she has been laying in bed eating sunflower seeds and drinking sweet tea. Per pt report her bad days consist of staying in bed, eating and withdrawing from others. Pt also showed writer [place] cigarette box and says she smokes 10 cigarettes per day. Pt discussed dynamics of relationship with her husband. Pt's husband is African and they are of muslim faith. Pt reports she is unhappy in the marriage and due to cultural differences does not believe her husband will attend marital counseling with her. Pt reports they sleep in separate quarters and lack intimacy. Pt describes him as "more of a caretaker than my husband" Pt expressed love for him and says her health is  affecting her relationship. 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 Also, pt says she withdrew 3k from their joint account and transferred it to her checking account and admits lying to husband about her reason for the transaction and taking advantage of him because of the language barrier. Writer provided psychoeducation on bipolar disorder(highs-mania, lows-depression) and explained inability to make rational decisions when having manic episode. Actively listened as pt discussed thrill she receives when she does online shopping. Processed with pt consequences of impulsive spending during manic phase and damage that is done when phase subsides. Discussed with pt giving her credit and debit cards to her husband and putting spending limits on her accounts. Pt says she is agreeable to do this and states she does not want to cause anymore damage. ? ?Suicidal/Homicidal: Pt denies SI/HI no plan, intent or attempt to harm self or others reported. No psychotic sxs reported. ? ?Plan: Return again in 1 weeks. ? ?Diagnosis: Bipolar I disorder ?  PTSD ?  Personality disorder ? ?Collaboration of Care: Other none requested ? ?Patient/Guardian was advised Release of Information must be obtained prior to any record release in order to collaborate their care with an outside provider. Patient/Guardian was advised if they have not already done so to contact the registration department to sign all necessary forms in order for [name] to release information regarding their care.  ? ?Consent: Patient/Guardian gives verbal consent for treatment and assignment of benefits for services provided during this visit. Patient/Guardian expressed understanding and agreed to proceed.  ? ?Kaijah Abts Lynelle Smoke, LCSW ?05/16/2021 ? ?

## 2021-05-16 NOTE — Telephone Encounter (Signed)
Called pt and relayed results. Pt verbalized understanding.  

## 2021-05-20 ENCOUNTER — Other Ambulatory Visit: Payer: Medicare Other

## 2021-05-20 ENCOUNTER — Other Ambulatory Visit: Payer: Self-pay | Admitting: Family

## 2021-05-20 DIAGNOSIS — R1310 Dysphagia, unspecified: Secondary | ICD-10-CM

## 2021-05-21 ENCOUNTER — Other Ambulatory Visit: Payer: Self-pay | Admitting: Family

## 2021-05-21 ENCOUNTER — Telehealth: Payer: Self-pay | Admitting: Family Medicine

## 2021-05-21 DIAGNOSIS — R1310 Dysphagia, unspecified: Secondary | ICD-10-CM

## 2021-05-21 MED ORDER — PANTOPRAZOLE SODIUM 40 MG PO TBEC: 40.0000 mg | DELAYED_RELEASE_TABLET | Freq: Two times a day (BID) | ORAL | 2 refills | Status: DC

## 2021-05-21 NOTE — Telephone Encounter (Signed)
Pt requesting a cb regarding medications ?

## 2021-05-21 NOTE — Telephone Encounter (Signed)
Pt states the cholesterol and BP medications are too expensive for her insurance and is wondering if there is an alternative Shannon [name] can prescribe that may be cheaper? ? ?Pt also noted that she needs a refill on her Pantoprazole (Protonix) 40 MG medication that was prescribed by her last doctor, Dr. Valere Dross and that she is going out of town and would like to have all her medications. After looking at the last documentation from Dr. Valere Dross, 08/12/2020 patient was supposed to be on it (dx was dysphagia) but looks like medication was discontinued on 04/14/2021 by another office with no reason provided. Pt states she is supposed to take this medication the rest of her life due to the surgery. Please advise refill. ?

## 2021-05-22 ENCOUNTER — Ambulatory Visit (HOSPITAL_COMMUNITY): Payer: Medicare Other | Admitting: Clinical

## 2021-05-23 ENCOUNTER — Telehealth (HOSPITAL_COMMUNITY): Payer: Self-pay | Admitting: Clinical

## 2021-06-02 ENCOUNTER — Emergency Department: Admit: 2021-06-02 | Payer: MEDICARE

## 2021-06-02 ENCOUNTER — Inpatient Hospital Stay: Admit: 2021-06-02 | Discharge: 2021-06-02 | Disposition: A | Discharge status: Home / Self Care | Payer: MEDICARE

## 2021-06-02 ENCOUNTER — Emergency Department: Payer: MEDICARE

## 2021-06-02 DIAGNOSIS — M533 Sacrococcygeal disorders, not elsewhere classified: Secondary | ICD-10-CM

## 2021-06-02 DIAGNOSIS — M25552 Pain in left hip: Secondary | ICD-10-CM | POA: Diagnosis not present

## 2021-06-02 DIAGNOSIS — M160 Bilateral primary osteoarthritis of hip: Secondary | ICD-10-CM | POA: Diagnosis not present

## 2021-06-02 DIAGNOSIS — M25551 Pain in right hip: Secondary | ICD-10-CM | POA: Diagnosis not present

## 2021-06-02 DIAGNOSIS — Z043 Encounter for examination and observation following other accident: Secondary | ICD-10-CM | POA: Diagnosis not present

## 2021-06-02 DIAGNOSIS — M8580 Other specified disorders of bone density and structure, unspecified site: Secondary | ICD-10-CM | POA: Diagnosis not present

## 2021-06-02 DIAGNOSIS — M791 Myalgia, unspecified site: Secondary | ICD-10-CM | POA: Diagnosis not present

## 2021-06-02 DIAGNOSIS — M47816 Spondylosis without myelopathy or radiculopathy, lumbar region: Secondary | ICD-10-CM | POA: Diagnosis not present

## 2021-06-02 MED ORDER — HYDROCODONE-ACETAMINOPHEN 5-325 MG PO TABS
5-325 MG | ORAL_TABLET | Freq: Three times a day (TID) | ORAL | 0 refills | Status: AC | PRN
Start: 2021-06-02 — End: 2021-06-05

## 2021-06-02 MED ORDER — MELOXICAM 15 MG PO TABS
15 MG | ORAL_TABLET | Freq: Every day | ORAL | 0 refills | Status: AC | PRN
Start: 2021-06-02 — End: 2021-06-12

## 2021-06-02 MED ORDER — DOCUSATE SODIUM 100 MG PO CAPS
100 MG | ORAL_CAPSULE | Freq: Two times a day (BID) | ORAL | 0 refills | Status: AC
Start: 2021-06-02 — End: 2021-06-12

## 2021-06-02 NOTE — Discharge Instructions (Signed)
You have been prescribed an opiate medication, NORCO. Opiates can be addictive, even in small quantities. Take only what you need to bear your pain. Return any unused portion to the pharmacy from which you obtained it. Opiates may also be sedating. While this is not a problem under normal circumstances, when taken with alcohol or while driving or operating heavy machinery, this medicine could cause you to become too sleepy and/or hurt yourself or others. Therefore, it is very important that you DO NOT DRIVE, DRINK ALCOHOL, OR OPERATE HEAVY MACHINERY WHILE TAKING THE MEDICINE(S) LISTED ABOVE.

## 2021-06-02 NOTE — ED Provider Notes (Signed)
[facility] EMERGENCY DEPARTMENT  EMERGENCY DEPARTMENT ENCOUNTER        Pt Name: Erin Harrell  MRN: [id number]  Birthdate 01-04-1966  Date of evaluation: 06/02/2021  Provider: Burman Freestone, PA  PCP: No primary care provider on file.  Note Started: 1:34 PM EDT 06/02/21      APP. I have evaluated this patient.  My supervising physician was available for consultation.      CHIEF COMPLAINT       Chief Complaint   Patient presents with    Fall     OOB, bilat hip pain       HISTORY OF PRESENT ILLNESS: 1 or more Elements     History From: patient  Limitations to history : None    Erin Harrell is a 56 y.o. female who presents to the emergency department today with complaints of bilateral hip pain, tailbone pain.  She states about a week ago she was stepping on a plastic stool to get into her elevated bed when the stool broke, and she fell landing on her bottom.  She has been trying to shake it off for the last 7 days or so, and states that the pain is just getting worse.  She has not tried taking anything to treat her symptoms.  She reports when she first stands up the pain is much more severe, and that she gets up and moves around it tends to get a little better.  Most of the pain is in her buttocks area as well as her right hip.  She is able to bear weight, though it is painful.  She has no numbness, tingling.  She denies sustaining any other injuries in the fall.  She is visiting in town from [place], and is trying to get home, but because the pain was more severe today she decided to come in for evaluation.  She has no further complaints.    Nursing Notes were all reviewed and agreed with or any disagreements were addressed in the HPI.    REVIEW OF SYSTEMS :      Review of Systems   Constitutional:  Negative for chills and fever.   Respiratory:  Negative for cough and shortness of breath.    Cardiovascular:  Negative for chest pain and palpitations.   Gastrointestinal:  Negative for  nausea and vomiting.   Musculoskeletal:  Positive for arthralgias and myalgias. Negative for back pain.   Skin:  Negative for color change and wound.   Neurological:  Negative for weakness and numbness.     Positives and Pertinent negatives as per HPI.     SURGICAL HISTORY   No past surgical history on file.    CURRENTMEDICATIONS       Discharge Medication List as of 06/02/2021  1:49 PM          ALLERGIES     Patient has no known allergies.    FAMILYHISTORY     No family history on file.     SOCIAL HISTORY          SCREENINGS        Glasgow Coma Scale  Eye Opening: Spontaneous  Best Verbal Response: Oriented  Best Motor Response: Obeys commands  Glasgow Coma Scale Score: 15                CIWA Assessment  BP: (!) 156/85  Heart Rate: 93           PHYSICAL EXAM  1  or more Elements     ED Triage Vitals [06/02/21 1134]   BP Temp Temp Source Heart Rate Resp SpO2 Height Weight   (!) 156/85 98.2 F (36.8 C) Oral 93 18 99 % -- --       Physical Exam  Vitals and nursing note reviewed.   Constitutional:       General: She is not in acute distress.     Appearance: She is well-developed. She is not ill-appearing, toxic-appearing or diaphoretic.   HENT:      Head: Normocephalic and atraumatic.   Eyes:      Conjunctiva/sclera: Conjunctivae normal.      Pupils: Pupils are equal, round, and reactive to light.   Cardiovascular:      Pulses:           Posterior tibial pulses are 2+ on the right side and 2+ on the left side.   Pulmonary:      Effort: Pulmonary effort is normal. No respiratory distress.   Musculoskeletal:      Cervical back: Normal range of motion and neck supple.      Thoracic back: Normal.      Lumbar back: Normal.      Right hip: Tenderness (right SI joint) present. No deformity or crepitus.      Left hip: No deformity, tenderness or crepitus.      Right upper leg: Normal.      Left upper leg: Normal.      Comments: Tenderness at top of gluteal cleft over coccyx   Skin:     General: Skin is warm and dry.    Neurological:      General: No focal deficit present.      Mental Status: She is alert and oriented to person, place, and time.   Psychiatric:         Mood and Affect: Mood normal.         Behavior: Behavior normal. Behavior is cooperative.         DIAGNOSTIC RESULTS   LABS:    Labs Reviewed - No data to display    When ordered only abnormal lab results are displayed. All other labs were within normal range or not returned as of this dictation.    EKG: When ordered, EKG's are interpreted by the Emergency Department Physician in the absence of a cardiologist.  Please see their note for interpretation of EKG.    RADIOLOGY:   Non-plain film images such as CT, Ultrasound and MRI are read by the radiologist. Plain radiographic images are visualized and preliminarily interpreted by the ED Provider with the below findings:    Interpretation per the Radiologist below, if available at the time of this note:    XR HIP 2-3 VW W PELVIS LEFT   Final Result   No acute osseous abnormality.         XR HIP 2-3 VW W PELVIS RIGHT   Final Result   No displaced fractures in the pelvis or right hip.           XR HIP 2-3 VW W PELVIS LEFT    Result Date: 06/02/2021  EXAMINATION: ONE XRAY VIEW OF THE PELVIS AND TWO XRAY VIEWS LEFT HIP 06/02/2021 12:21 pm COMPARISON: None. HISTORY: ORDERING SYSTEM PROVIDED HISTORY: fall TECHNOLOGIST PROVIDED HISTORY: Reason for exam:->fall Reason for Exam: FALL FINDINGS: There is no evidence of acute fracture.  There is normal alignment.  No acute joint abnormality.  No focal osseous lesion. No focal  soft tissue abnormality.     No acute osseous abnormality.     XR HIP 2-3 VW W PELVIS RIGHT    Result Date: 06/02/2021  EXAMINATION: ONE XRAY VIEW OF THE PELVIS AND TWO XRAY VIEWS RIGHT HIP 06/02/2021 12:00 pm COMPARISON: June 02, 2021 HISTORY: ORDERING SYSTEM PROVIDED HISTORY: fall TECHNOLOGIST PROVIDED HISTORY: Reason for exam:->fall Reason for Exam: FALL FINDINGS: Moderate osteopenia.  No acute fracture  dislocation noted.  Mild-to-moderate lumbar degenerative changes.  Mild degenerative change bilateral hip joints.     No displaced fractures in the pelvis or right hip.       No results found.    PROCEDURES   Unless otherwise noted below, none     Procedures    PAST MEDICAL HISTORY      has no past medical history on file.     EMERGENCY DEPARTMENT COURSE and DIFFERENTIAL DIAGNOSIS/MDM:   Vitals:    Vitals:    06/02/21 1134   BP: (!) 156/85   Pulse: 93   Resp: 18   Temp: 98.2 F (36.8 C)   TempSrc: Oral   SpO2: 99%       Patient was given the following medications:  Medications - No data to display          Is this patient to be included in the SEP-1 Core Measure due to severe sepsis or septic shock?   No   Exclusion criteria - the patient is NOT to be included for SEP-1 Core Measure due to:  Infection is not suspected    Chronic Conditions affecting care:    has no past medical history on file.    CONSULTS: (Who and What was discussed)  None      Social Determinants Significantly Affecting Health : None    Records Reviewed (External and Source) EMR    CC/HPI Summary, DDx, ED Course, and Reassessment: This is a pleasant 56 year old female presenting today with complaints of bilateral hip pain, tailbone pain.  She states about a week ago she was stepping on a plastic stool to get into her elevated bed when the stool broke, and she fell landing on her bottom.  She has been trying to shake it off for the last 7 days or so, and states that the pain is just getting worse.  She has not tried taking anything to treat her symptoms.  She reports when she first stands up the pain is much more severe, and that she gets up and moves around it tends to get a little better.  Most of the pain is in her buttocks area as well as her right hip.  She is able to bear weight, though it is painful.  She has no numbness, tingling.  She denies sustaining any other injuries in the fall.  [place] and is  trying to get home, but because the pain was more severe today she decided to come in for evaluation.    - Vital signs were reviewed. Exam as above. Imaging ordered.   - Pertinent Labs & Imaging studies reviewed. (See chart for details)   -  Patient seen and evaluated in the emergency department.  -  Triage and nursing notes reviewed and incorporated.  -  Old chart records reviewed and incorporated  -  APP. I have evaluated this patient.  My supervising physician was available for consultation.  -  Differential diagnosis includes: abrasion/laceration, contusion, fracture, sprain/strain, dislocation  -  Work-up included:  See above  - Consults: Ortho - I briefly discussed case with Britta Mccreedy, NP with ortho, who advised Mobic daily for the next 7 to 10 days, follow-up with orthopedics.  -  Results discussed with patient and/or family.  Imaging studies show no acute osseous abnormality of the left hip or pelvis, no acute osseous abnormality of the right hip or pelvis.  At this time, we recommend discharge, as the patient is stable for outpatient management.  She will be discharged home with a waffle pillow to help distribute weight when sitting, as a lot of her pain seems to emanate from her tailbone region.  She will be discharged home with Mobic, docusate, Norco.  She is given sedation /addiction precautions related to the Norco.  She is given very strict return precautions. The patient and/or family is agreeable with plan of care and disposition.  -  Disposition:  Home in stable condition  - Critical Care: 0 minutes      I am the Primary Clinician of Record.  FINAL IMPRESSION      1. Coccydynia    2. Hip pain, unspecified laterality    3. Fall, initial encounter          DISPOSITION/PLAN     DISPOSITION Decision To Discharge 06/02/2021 01:34:22 PM      PATIENT REFERRED TO:  [facility]  [phone]  Schedule an appointment as soon as possible for a visit       [facility]  Emergency Department  [address] [place] [address]  [phone]    If symptoms worsen      DISCHARGE MEDICATIONS:  Discharge Medication List as of 06/02/2021  1:49 PM        START taking these medications    Details   meloxicam (MOBIC) 15 MG tablet Take 1 tablet by mouth daily as needed for Pain (Take with food), Disp-10 tablet, R-0Print      HYDROcodone-acetaminophen (NORCO) 5-325 MG per tablet Take 1 tablet by mouth every 8 hours as needed for Pain for up to 3 days. Intended supply: 3 days. Take lowest dose possible to manage pain Max Daily Amount: 3 tablets, Disp-6 tablet, R-0Print      docusate sodium (COLACE) 100 MG capsule Take 1 capsule by mouth 2 times daily for 10 days, Disp-60 capsule, R-0Print             DISCONTINUED MEDICATIONS:  Discharge Medication List as of 06/02/2021  1:49 PM                 (Please note that portions of this note were completed with a voice recognition program.  Efforts were made to edit the dictations but occasionally words are mis-transcribed.)    Burman Freestone, PA (electronically signed)       Burman Freestone, [place]  06/02/21 1355

## 2021-06-03 ENCOUNTER — Telehealth (HOSPITAL_BASED_OUTPATIENT_CLINIC_OR_DEPARTMENT_OTHER): Payer: Medicare Other | Admitting: Psychiatry

## 2021-06-03 ENCOUNTER — Encounter (HOSPITAL_COMMUNITY): Payer: Self-pay | Admitting: Psychiatry

## 2021-06-03 VITALS — Wt 280.0 lb

## 2021-06-03 DIAGNOSIS — F319 Bipolar disorder, unspecified: Secondary | ICD-10-CM

## 2021-06-03 DIAGNOSIS — F609 Personality disorder, unspecified: Secondary | ICD-10-CM

## 2021-06-03 DIAGNOSIS — F4310 Post-traumatic stress disorder, unspecified: Secondary | ICD-10-CM | POA: Diagnosis not present

## 2021-06-03 MED ORDER — ARIPIPRAZOLE 5 MG PO TABS
5.0000 mg | ORAL_TABLET | Freq: Every day | ORAL | 1 refills | Status: DC
Start: 2021-06-03 — End: 2021-08-04

## 2021-06-03 MED ORDER — CLONAZEPAM 0.5 MG PO TABS: 0.5000 mg | ORAL_TABLET | Freq: Three times a day (TID) | ORAL | 1 refills | Status: DC | PRN

## 2021-06-03 MED ORDER — FLUOXETINE HCL 20 MG PO CAPS
20.0000 mg | ORAL_CAPSULE | Freq: Every day | ORAL | 1 refills | Status: DC
Start: 2021-06-03 — End: 2021-08-04

## 2021-06-03 MED ORDER — QUETIAPINE FUMARATE 300 MG PO TABS: 300.0000 mg | ORAL_TABLET | Freq: Every day | ORAL | 1 refills | Status: DC
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
## 2021-06-06 ENCOUNTER — Telehealth: Payer: Self-pay

## 2021-06-06 ENCOUNTER — Other Ambulatory Visit: Payer: Self-pay | Admitting: Family Medicine

## 2021-06-06 DIAGNOSIS — R600 Localized edema: Secondary | ICD-10-CM

## 2021-06-06 MED ORDER — FUROSEMIDE 20 MG PO TABS: 20.0000 mg | ORAL_TABLET | Freq: Every day | ORAL | 0 refills | Status: DC

## 2021-06-06 NOTE — Telephone Encounter (Signed)
Pt called in to report that she has been experiencing unbearable hip pain radiating to her feet accompanied by leg swelling but is currently out of state. The leg swelling is so bad she is unable to wear any other shoes besides slippers and cannot walk without pain. She has been to the ED where they had evaluated her and prescribed her some pain medication but states it will not last her to get home and the swelling is so severe she is unable to walk and carry luggage in the airport. She is requesting something acute just to last her so she is able to carry her luggage and walk through the airport to get home on Sunday. After conversing with [name], she advised that she will send in something for the swelling just for the short period of time but pt will need to follow up with her first thing this week. I relayed the information to the pt and she verbalized understanding and booked an appointment for Tuesday morning.  ?

## 2021-06-08 ENCOUNTER — Emergency Department (HOSPITAL_COMMUNITY)
Admission: EM | Admit: 2021-06-08 | Discharge: 2021-06-08 | Discharge status: Against Medical Advice | Payer: Medicare Other | Attending: Physician Assistant | Admitting: Physician Assistant

## 2021-06-08 ENCOUNTER — Encounter (HOSPITAL_COMMUNITY): Payer: Self-pay | Admitting: Emergency Medicine

## 2021-06-08 ENCOUNTER — Other Ambulatory Visit: Payer: Self-pay

## 2021-06-08 DIAGNOSIS — Z5321 Procedure and treatment not carried out due to patient leaving prior to being seen by health care provider: Secondary | ICD-10-CM | POA: Insufficient documentation

## 2021-06-08 DIAGNOSIS — M7989 Other specified soft tissue disorders: Secondary | ICD-10-CM | POA: Insufficient documentation

## 2021-06-08 DIAGNOSIS — M79676 Pain in unspecified toe(s): Secondary | ICD-10-CM | POA: Insufficient documentation

## 2021-06-08 DIAGNOSIS — R1031 Right lower quadrant pain: Secondary | ICD-10-CM | POA: Diagnosis not present

## 2021-06-08 DIAGNOSIS — M25552 Pain in left hip: Secondary | ICD-10-CM | POA: Diagnosis not present

## 2021-06-08 DIAGNOSIS — E876 Hypokalemia: Secondary | ICD-10-CM | POA: Diagnosis not present

## 2021-06-08 DIAGNOSIS — M79646 Pain in unspecified finger(s): Secondary | ICD-10-CM | POA: Insufficient documentation

## 2021-06-08 DIAGNOSIS — R609 Edema, unspecified: Secondary | ICD-10-CM | POA: Diagnosis not present

## 2021-06-08 DIAGNOSIS — I10 Essential (primary) hypertension: Secondary | ICD-10-CM | POA: Diagnosis not present

## 2021-06-08 LAB — CBC WITH DIFFERENTIAL/PLATELET
Abs Immature Granulocytes: 0.01 10*3/uL (ref 0.00–0.07)
Basophils Absolute: 0 10*3/uL (ref 0.0–0.1)
Basophils Relative: 0 %
Eosinophils Absolute: 0.2 10*3/uL (ref 0.0–0.5)
Eosinophils Relative: 2 %
HCT: 37.4 % (ref 36.0–46.0)
Hemoglobin: 11.7 g/dL — ABNORMAL LOW (ref 12.0–15.0)
Immature Granulocytes: 0 %
Lymphocytes Relative: 19 %
Lymphs Abs: 1.4 10*3/uL (ref 0.7–4.0)
MCH: 24.7 pg — ABNORMAL LOW (ref 26.0–34.0)
MCHC: 31.3 g/dL (ref 30.0–36.0)
MCV: 79.1 fL — ABNORMAL LOW (ref 80.0–100.0)
Monocytes Absolute: 0.5 10*3/uL (ref 0.1–1.0)
Monocytes Relative: 7 %
Neutro Abs: 5.4 10*3/uL (ref 1.7–7.7)
Neutrophils Relative %: 72 %
Platelets: 291 10*3/uL (ref 150–400)
RBC: 4.73 MIL/uL (ref 3.87–5.11)
RDW: 17.8 % — ABNORMAL HIGH (ref 11.5–15.5)
WBC: 7.4 10*3/uL (ref 4.0–10.5)
nRBC: 0 % (ref 0.0–0.2)

## 2021-06-08 LAB — COMPREHENSIVE METABOLIC PANEL
ALT: 12 U/L (ref 0–44)
AST: 14 U/L — ABNORMAL LOW (ref 15–41)
Albumin: 3.2 g/dL — ABNORMAL LOW (ref 3.5–5.0)
Alkaline Phosphatase: 146 U/L — ABNORMAL HIGH (ref 38–126)
Anion gap: 11 (ref 5–15)
BUN: 8 mg/dL (ref 6–20)
CO2: 30 mmol/L (ref 22–32)
Calcium: 7.7 mg/dL — ABNORMAL LOW (ref 8.9–10.3)
Chloride: 98 mmol/L (ref 98–111)
Creatinine, Ser: 0.96 mg/dL (ref 0.44–1.00)
GFR, Estimated: >60 mL/min (ref >60)
Glucose, Bld: 96 mg/dL (ref 70–99)
Potassium: 2.4 mmol/L — CL (ref 3.5–5.1)
Sodium: 139 mmol/L (ref 135–145)
Total Bilirubin: 0.7 mg/dL (ref 0.3–1.2)
Total Protein: 7.6 g/dL (ref 6.5–8.1)

## 2021-06-08 LAB — URINALYSIS, ROUTINE W REFLEX MICROSCOPIC
Bilirubin Urine: NEGATIVE
Glucose, UA: NEGATIVE mg/dL
Hgb urine dipstick: NEGATIVE
Ketones, ur: NEGATIVE mg/dL
Leukocytes,Ua: NEGATIVE
Nitrite: NEGATIVE
Protein, ur: NEGATIVE mg/dL
Specific Gravity, Urine: 1.012 (ref 1.005–1.030)
pH: 5 (ref 5.0–8.0)

## 2021-06-08 LAB — MAGNESIUM: Magnesium: 1.8 mg/dL (ref 1.7–2.4)

## 2021-06-08 NOTE — ED Notes (Signed)
Patient called x2 from this writer for vitals and x2 by transort for scans with no response and not visible in the lobby or outside ?

## 2021-06-08 NOTE — ED Provider Triage Note (Signed)
Emergency Medicine Provider Triage Evaluation Note ? ?Shannon [name] , a 56 y.o. female  was evaluated in triage.  Pt complains of 2 complaints. ?About 2 weeks ago she fell when a stool broke while getting into bed.  She had x-rays at outside hospital then. ?She states that her left hip is still hurting and she has been unable to ambulate normally since. ?She states that over the past 4 days she has had pain and swelling in her bilateral lower extremities.  New today she is having spasms in her fingers and toes.  She took 2 doses of Lasix her PCP called in. ? ? ? ?Physical Exam  ?BP (!) 171/81 (BP Location: Right Arm)   Pulse 93   Temp 98.4 ?F (36.9 ?C) (Oral)   Resp 18   SpO2 93%  ?Gen:   Awake, no distress   ?Resp:  Normal effort  ?MSK:   Moves extremities without difficulty except for pain in the left hip with ROM.  ?Other:  Edema in BLE.  ? ?Medical Decision Making  ?Medically screening exam initiated at 5:48 PM.  Appropriate orders placed.  Shannon [name] was informed that the remainder of the evaluation will be completed by another provider, this initial triage assessment does not replace that evaluation, and the importance of remaining in the ED until their evaluation is complete. ? ? ?  ?Lorin Glass, [place] ?06/08/21 1751 ? ?

## 2021-06-08 NOTE — ED Triage Notes (Addendum)
Pt to triage via [facility] from home.  Reports bilateral lower leg swelling x 4-5 days.  It started while she was in [place].  Called PCP and was given Lasix 3 pills.  She only took 2 but didn't have relief.  Reports cramping in toes and fingers today.  Denies SOB.   ? ?Pt states she fell on step stool that broke while getting into bed while in [place] and fell on her buttocks.  Had negative x-rays at ER in [place].  Reports R groin pain since fall and she believes she had hip x-rays. ?

## 2021-06-09 ENCOUNTER — Telehealth: Payer: Self-pay

## 2021-06-09 ENCOUNTER — Other Ambulatory Visit: Payer: Self-pay | Admitting: Family Medicine

## 2021-06-09 DIAGNOSIS — E876 Hypokalemia: Secondary | ICD-10-CM

## 2021-06-09 NOTE — Telephone Encounter (Signed)
Called pt per Shannon [name]'s request regarding recent ED visit lab work showing potassium is very low. LM asking her to please return my call as it is very important. If she does not call back by lunch I will try to call again. ? ?If she calls back pt needs to know her potassium is dangerously low at 2.4 and Shannon [name] recommends she return to an emergency department to get this addressed. A potassium level this low can cause heart issues if not addressed. ?

## 2021-06-09 NOTE — Telephone Encounter (Signed)
Called and was able to get in touch with Shannon [name]. I let her know the results of her blood work that was done in the hospital and relayed [name] response. Pt states that she ended up leaving the hospital because it was too crowded and the wait was too long for the amount of pain she was in and does not wish to go back. I advised the importance of getting the potassium addressed as it can cause heart issues and recommended she tries [facility] for their shorter wait times. Pt states that she would prefer to wait until her appt tomorrow with [name] to address this to which I again advised the issues waiting can cause. Pt verbalized understanding but still chose to wait for her appt in the morning and mentioned she needed her potassium refilled as the pharmacy told her she does not have any left. I told her I will send the request over to [facility] for the refills.... ok to refill potassium chloride (KLOR-CON) 10 MEQ tablet? ?

## 2021-06-10 ENCOUNTER — Other Ambulatory Visit: Payer: Self-pay

## 2021-06-10 ENCOUNTER — Emergency Department (HOSPITAL_COMMUNITY): Payer: Medicare Other

## 2021-06-10 ENCOUNTER — Emergency Department (HOSPITAL_COMMUNITY)
Admission: EM | Admit: 2021-06-10 | Discharge: 2021-06-10 | Disposition: A | Discharge status: Home / Self Care | Payer: Medicare Other | Attending: Emergency Medicine | Admitting: Emergency Medicine

## 2021-06-10 ENCOUNTER — Ambulatory Visit (INDEPENDENT_AMBULATORY_CARE_PROVIDER_SITE_OTHER): Payer: Medicare Other | Admitting: Family Medicine

## 2021-06-10 ENCOUNTER — Ambulatory Visit: Payer: Medicare Other

## 2021-06-10 ENCOUNTER — Encounter (HOSPITAL_COMMUNITY): Payer: Self-pay | Admitting: Emergency Medicine

## 2021-06-10 ENCOUNTER — Encounter: Payer: Self-pay | Admitting: Family Medicine

## 2021-06-10 VITALS — BP 160/74 | HR 95 | Temp 97.7°F | Ht 64.0 in

## 2021-06-10 DIAGNOSIS — E876 Hypokalemia: Secondary | ICD-10-CM

## 2021-06-10 DIAGNOSIS — I10 Essential (primary) hypertension: Secondary | ICD-10-CM | POA: Insufficient documentation

## 2021-06-10 DIAGNOSIS — G4730 Sleep apnea, unspecified: Secondary | ICD-10-CM

## 2021-06-10 DIAGNOSIS — R202 Paresthesia of skin: Secondary | ICD-10-CM

## 2021-06-10 DIAGNOSIS — R600 Localized edema: Secondary | ICD-10-CM

## 2021-06-10 DIAGNOSIS — R200 Anesthesia of skin: Secondary | ICD-10-CM | POA: Insufficient documentation

## 2021-06-10 DIAGNOSIS — R002 Palpitations: Secondary | ICD-10-CM

## 2021-06-10 DIAGNOSIS — M1612 Unilateral primary osteoarthritis, left hip: Secondary | ICD-10-CM | POA: Diagnosis not present

## 2021-06-10 DIAGNOSIS — F17210 Nicotine dependence, cigarettes, uncomplicated: Secondary | ICD-10-CM | POA: Insufficient documentation

## 2021-06-10 DIAGNOSIS — R262 Difficulty in walking, not elsewhere classified: Secondary | ICD-10-CM

## 2021-06-10 DIAGNOSIS — S79912A Unspecified injury of left hip, initial encounter: Secondary | ICD-10-CM | POA: Diagnosis not present

## 2021-06-10 DIAGNOSIS — W1839XA Other fall on same level, initial encounter: Secondary | ICD-10-CM | POA: Insufficient documentation

## 2021-06-10 DIAGNOSIS — M25552 Pain in left hip: Secondary | ICD-10-CM | POA: Diagnosis not present

## 2021-06-10 DIAGNOSIS — I509 Heart failure, unspecified: Secondary | ICD-10-CM | POA: Diagnosis not present

## 2021-06-10 DIAGNOSIS — R609 Edema, unspecified: Secondary | ICD-10-CM | POA: Diagnosis not present

## 2021-06-10 DIAGNOSIS — Z79899 Other long term (current) drug therapy: Secondary | ICD-10-CM | POA: Insufficient documentation

## 2021-06-10 DIAGNOSIS — M79605 Pain in left leg: Secondary | ICD-10-CM | POA: Diagnosis not present

## 2021-06-10 LAB — COMPREHENSIVE METABOLIC PANEL
ALT: 13 U/L (ref 0–44)
AST: 14 U/L — ABNORMAL LOW (ref 15–41)
Albumin: 3.2 g/dL — ABNORMAL LOW (ref 3.5–5.0)
Alkaline Phosphatase: 143 U/L — ABNORMAL HIGH (ref 38–126)
Anion gap: 6 (ref 5–15)
BUN: 6 mg/dL (ref 6–20)
CO2: 32 mmol/L (ref 22–32)
Calcium: 7.2 mg/dL — ABNORMAL LOW (ref 8.9–10.3)
Chloride: 103 mmol/L (ref 98–111)
Creatinine, Ser: 0.86 mg/dL (ref 0.44–1.00)
GFR, Estimated: >60 mL/min (ref >60)
Glucose, Bld: 95 mg/dL (ref 70–99)
Potassium: 2.6 mmol/L — CL (ref 3.5–5.1)
Sodium: 141 mmol/L (ref 135–145)
Total Bilirubin: 0.4 mg/dL (ref 0.3–1.2)
Total Protein: 7.7 g/dL (ref 6.5–8.1)

## 2021-06-10 LAB — POTASSIUM: Potassium: 3.5 mmol/L (ref 3.5–5.1)

## 2021-06-10 LAB — MAGNESIUM: Magnesium: 2 mg/dL (ref 1.7–2.4)

## 2021-06-10 LAB — TROPONIN I (HIGH SENSITIVITY)
Troponin I (High Sensitivity): 3 ng/L (ref <18)
Troponin I (High Sensitivity): 3 ng/L (ref <18)

## 2021-06-10 LAB — BRAIN NATRIURETIC PEPTIDE: B Natriuretic Peptide: 43.6 pg/mL (ref 0.0–100.0)

## 2021-06-10 LAB — TSH: TSH: 2.408 u[IU]/mL (ref 0.350–4.500)

## 2021-06-10 MED ORDER — MORPHINE SULFATE (PF) 4 MG/ML IV SOLN
4.0000 mg | Freq: Once | INTRAVENOUS | Status: AC
  Administered 2021-06-10: 4 mg via INTRAVENOUS
  Filled 2021-06-10: qty 1

## 2021-06-10 MED ORDER — POTASSIUM CHLORIDE CRYS ER 20 MEQ PO TBCR
40.0000 meq | EXTENDED_RELEASE_TABLET | Freq: Once | ORAL | Status: AC
  Administered 2021-06-10: 40 meq via ORAL
  Filled 2021-06-10: qty 2

## 2021-06-10 MED ORDER — HYDROCODONE-ACETAMINOPHEN 5-325 MG PO TABS
2.0000 | ORAL_TABLET | ORAL | 0 refills | Status: DC | PRN
Start: 2021-06-10 — End: 2021-06-10

## 2021-06-10 MED ORDER — MORPHINE SULFATE 15 MG PO TABS
7.5000 mg | ORAL_TABLET | ORAL | 0 refills | Status: DC | PRN
Start: 2021-06-10 — End: 2021-06-13

## 2021-06-10 MED ORDER — KETOROLAC TROMETHAMINE 15 MG/ML IJ SOLN
15.0000 mg | Freq: Once | INTRAMUSCULAR | Status: AC
  Administered 2021-06-10: 15 mg via INTRAMUSCULAR
  Filled 2021-06-10: qty 1

## 2021-06-10 MED ORDER — POTASSIUM CHLORIDE CRYS ER 20 MEQ PO TBCR: 40.0000 meq | EXTENDED_RELEASE_TABLET | Freq: Two times a day (BID) | ORAL | 0 refills | Status: DC

## 2021-06-10 MED ORDER — ONDANSETRON HCL 4 MG/2ML IJ SOLN
4.0000 mg | Freq: Once | INTRAMUSCULAR | Status: AC
  Administered 2021-06-10: 4 mg via INTRAVENOUS
  Filled 2021-06-10: qty 2

## 2021-06-10 MED ORDER — SODIUM CHLORIDE 0.9 % IV SOLN: INTRAVENOUS | Status: DC

## 2021-06-10 MED ORDER — POTASSIUM CHLORIDE 10 MEQ/100ML IV SOLN
10.0000 meq | INTRAVENOUS | Status: DC
  Administered 2021-06-10 (×4): 10 meq via INTRAVENOUS
  Filled 2021-06-10 (×5): qty 100

## 2021-06-10 MED ORDER — HYDROCODONE-ACETAMINOPHEN 5-325 MG PO TABS
1.0000 | ORAL_TABLET | Freq: Once | ORAL | Status: AC
  Administered 2021-06-10: 1 via ORAL
  Filled 2021-06-10: qty 1

## 2021-06-10 NOTE — ED Provider Notes (Signed)
56 yo F with a chief complaint of a low potassium.  The patient had gone to another emergency department yesterday and had a potassium drawn that was 2.4.  Recheck today here 2.5.  No EKG changes.  Plan to replenish the patient's potassium and if shows some improvement will discharge home.  I was notified by nursing that the patient had removed her IV and had gotten to a bit of a verbal disagreement with the nursing staff.  She is questing be discharged at this time.  I discussed the results with her.  We will have her follow-up with her family doctor hopefully within the week to have her potassium rechecked. ? ?She is mostly concerned about her left hip pain.  She had a CT scan that was done here without obvious acute finding.  I discussed typical symptomatic treatment with her.  We will do a short course of narcotics.  PCP follow-up. ? ? ?  ?Deno Etienne, DO ?06/10/21 1732 ? ?

## 2021-06-10 NOTE — Progress Notes (Signed)
? ?Subjective:  ? ? Patient ID: Shannon [name], female    DOB: [birth year], 56 y.o.   MRN: [id number] ? ?HPI ?Chief Complaint  ?Patient presents with  ? Leg Swelling  ?  Was in bed all day yesterday and elevated it which helped the swelling some  ? Hip Pain  ?  Bilateral hip pain  ? ?Her husband is with her and her sister is listening on the phone.  ?Complains of frequent palpitations and muscle spasms. She went to the ED 2 days ago for bilateral  edema and leg pain and labs were done. Her potassium was 2.4. She left the ED before getting her results or being treated for hypokalemia.  ?She was prescribed Lasix x 3 days while out of town in [place] for severe LE edema. She is not currently taking potassium.  ? ?Denies fever, chills, headache, chest pain, abdominal pain, vomiting or diarrhea.  ? ?Bilateral LE edema. She is sitting in a wheelchair. States she cannot walk due to leg and left hip pain.  ?Normally she is able to ambulate.  ?Hx of numbness in toes and tingling in legs and feet for a year or more.  ? ?States edema has improved since laying in bed with her legs elevated last night.  ? ?Hx of sleep apnea in her chart and patient denies having a sleep study in the past.  ? ? ?HTN- states amlodipine was too expensive and she has not had time to check with her insurance as to the affordable medications.  ? ?GERD- she is taking pantoprazole.  ? ?Reviewed allergies, medications, past medical, surgical, family, and social history. ? ? ?Past Medical History:  ?Diagnosis Date  ? Allergic rhinitis   ? Allergic rhinitis 01/25/2009  ?     ? ANEMIA-IRON DEFICIENCY 01/25/2009  ? Anxiety   ? Backache 01/25/2009  ? Qualifier: Diagnosis of  By: Jenny Reichmann MD, Hunt Oris   ? Bipolar 1 disorder ([place])   ? Chronic pain syndrome   ? Complete rotator cuff tear 07/21/2013  ? Complete tear of right rotator cuff 07/21/2013  ? Contact lens/glasses fitting   ? [place] DISEASE, LUMBAR 01/25/2009  ? Essential hypertension 01/25/2009  ? On Bystolic   ? GAD  (generalized anxiety disorder) 12/28/2013  ? GERD (gastroesophageal reflux disease)   ? 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 Headache, acute 12/20/2013  ? Hx of laparoscopic gastric banding 09/03/2010  ? Surgery date: 07/17/10   ? HYPERLIPIDEMIA 01/25/2009  ? Hyperlipidemia with target LDL less than 130 01/25/2009  ? HYPERTENSION 01/25/2009  ? Hypokalemia, inadequate intake 09/01/2011  ? Insomnia 04/04/2011  ? INSOMNIA-SLEEP DISORDER-UNSPEC 04/30/2009  ? Qualifier: Diagnosis of  By: Jenny Reichmann MD, Hunt Oris   ? Iron deficiency anemia 01/25/2009  ?     ? Labyrinthitis 02/19/2014  ? Leiomyoma of uterus, unspecified 08/13/2008  ? Overview:  Leiomyoma Of The Uterus  10/1 IMO update  ? Localized edema 06/12/2015  ? MANIC DEPRESSIVE ILLNESS 01/25/2009  ? pt is unsue if this is her specifc dx  ? Mixed bipolar I disorder ([place]) 07/12/2012  ? Morbid obesity ([place]) 01/25/2009  ? Night sweats   ? Other abnormal glucose 10/26/2012  ? Palpitations 10/25/2013  ? PEPTIC ULCER DISEASE, HELICOBACTER PYLORI POSITIVE 10/03/2009  ? Peripheral edema 06/25/2015  ? Pernicious anemia 03/28/2012  ? Piriformis syndrome of both sides 09/02/2018  ? PUD (peptic ulcer disease) 10/03/2009  ?     ? Right shoulder pain 05/03/2013  ? Dg Shoulder Right  05/03/2013   CLINICAL DATA Pain.  EXAM RIGHT SHOULDER - 2+ VIEW  COMPARISON None.  FINDINGS Acromioclavicular and glenohumeral degenerative change present. Questionable calcific density noted in the region of the supraspinatus space, possibly representing calcific supraspinatus tendinitis. This could represent a sclerotic density in the acromion. MRI of the right shoulder suggest  ? Sleep apnea 08/12/2016  ? SMOKER 01/25/2009  ? Qualifier: Diagnosis of  By: Jenny Reichmann MD, Hunt Oris   ? Subacromial bursitis 05/17/2013  ? SUBSTANCE ABUSE 11/06/2009  ?     ? Thiamin deficiency 10/30/2012  ? Tobacco abuse 06/09/2010  ? Visual disturbance 05/03/2013  ? Vitamin D deficiency 10/27/2012  ? ?Past Surgical History:  ?Procedure Laterality Date  ? ABDOMINAL HYSTERECTOMY     ? BLADDER SURGERY    ? s/p with ?diverticulitis  ? HAND TENDON SURGERY  1991  ? s/p-Right-index and middle  ? LAPAROSCOPIC GASTRIC BAND REMOVAL WITH LAPAROSCOPIC GASTRIC SLEEVE RESECTION N/A 08/17/2016  ? Procedure: LAPAROSCOPIC GASTRIC BAND REMOVAL WITH LAPAROSCOPIC GASTRIC SLEEVE RESECTION WITH UPPER ENDO;  Surgeon: Alphonsa Overall, MD;  Location: [facility];  Service: General;  Laterality: N/A;  ? LAPAROSCOPIC GASTRIC BANDING  07/14/10  ? SHOULDER ARTHROSCOPY Right 07/21/2013  ? Procedure: RIGHT ARTHROSCOPY SHOULDER DEBRIDMENT EXTENTSIVE,ARTHROSCOPIC REMOVE LOOSE FOREIGN BODY, BICEPS TENOLYSIS ;  Surgeon: Johnny Bridge, MD;  Location: [facility];  Service: Orthopedics;  Laterality: Right;  ? TUBAL LIGATION    ? ?Current Outpatient Medications on File Prior to Visit  ?Medication Sig Dispense Refill  ? amLODipine (NORVASC) 5 MG tablet Take 1 tablet (5 mg total) by mouth daily. 30 tablet 2  ? ARIPiprazole (ABILIFY) 5 MG tablet Take 1 tablet (5 mg total) by mouth daily. 30 tablet 1  ? atorvastatin (LIPITOR) 20 MG tablet Take 1 tablet (20 mg total) by mouth daily. 90 tablet 1  ? clonazePAM (KLONOPIN) 0.5 MG tablet Take 1 tablet (0.5 mg total) by mouth 3 (three) times daily as needed for anxiety. 75 tablet 1  ? FLUoxetine (PROZAC) 20 MG capsule Take 1 capsule (20 mg total) by mouth daily. 30 capsule 1  ? pantoprazole (PROTONIX) 40 MG tablet Take 1 tablet (40 mg total) by mouth 2 (two) times daily before a meal. 60 tablet 2  ? QUEtiapine (SEROQUEL) 300 MG tablet Take 1 tablet (300 mg total) by mouth at bedtime. 30 tablet 1  ? potassium chloride (KLOR-CON M) 10 MEQ tablet Take 1 tablet (10 mEq total) by mouth daily. (Patient not taking: Reported on 06/10/2021) 7 tablet 0  ? ?No current facility-administered medications on file prior to visit.  ? ? ? ? ? ? ?Review of Systems ?Pertinent positives and negatives in the history of present illness. ? ?   ?Objective:  ? Physical Exam ?Constitutional:   ?   General: She  is not in acute distress. ?   Appearance: She is obese.  ?Cardiovascular:  ?   Rate and Rhythm: Normal rate and regular rhythm.  ?Pulmonary:  ?   Effort: Pulmonary effort is normal.  ?   Breath sounds: Normal breath sounds.  ?Musculoskeletal:  ?   Right lower leg: Edema present.  ?   Left lower leg: Edema present.  ?Skin: ?   General: Skin is warm and dry.  ?   Capillary Refill: Capillary refill takes less than 2 seconds.  ?Neurological:  ?   General: No focal deficit present.  ?   Mental Status: She is alert.  ?   Gait: Gait  abnormal.  ?Psychiatric:     ?   Mood and Affect: Mood normal.  ? ?BP (!) 160/74 (BP Location: Left Arm, Patient Position: Sitting, Cuff Size: Large)   Pulse 95   Temp 97.7 ?F (36.5 ?C) (Temporal)   Ht '5\' 4"'$  (1.626 m)   SpO2 94%   BMI 48.06 kg/m?  ? ? ?   ?Assessment & Plan:  ?Palpitations ?-potassium 2.4 in ED but she eloped before getting results and treatment. She will need K replacement  ?EKG shows NSR, rate 89, non specific ST-T wave abnormalities. ?LVH ? ?Bilateral lower extremity edema - Plan: Ambulatory referral to Vascular Surgery ?-did not improve with Lasix per patient but did improve after being in the bed with legs elevated for the past 24 hours.  ?Will need work up for CHF ? ?Sleep apnea, unspecified type ?-highly suspect she has OSA but denies hx of sleep study. I will order a HST.  ? ?Morbid obesity ([place]) ?-counseling on healthy diet. Very sedentary lifestyle ? ?Numbness and tingling of both lower extremities - Plan: Ambulatory referral to Vascular Surgery ?-no sign of obstruction on exam today, this has been an ongoing issue for over a year. Referral to vein and vascular for evaluation.  ? ?Hypokalemia ?-chronically low K per chart with her last one being 2.4 2 days ago in the ED. She eloped and was not treated for this. Now quite symptomatic with palpitations and muscle spasms. EMS called and will transport patient to the ED. Request  evaluation and treatment.  ? ?Unable  to ambulate ?-this is new since her previous visit due to leg and hip pain.  ? ? ?

## 2021-06-10 NOTE — ED Triage Notes (Signed)
Per patient, complaining of left leg pain, increases with ambulation and weight bearing-radiates to left groin ?

## 2021-06-10 NOTE — Addendum Note (Signed)
Addended by: Girtha Rm on: 06/10/2021 11:29 AM ? ? Modules accepted: Orders ? ?

## 2021-06-10 NOTE — ED Provider Notes (Signed)
?Shannon [name] ?Provider Note ? ? ?CSN: [id number] ?Arrival date & time: 06/10/21  1113 ? ?  ? ?History ? ?Chief Complaint  ?Patient presents with  ? Leg Pain  ? Leg Swelling  ? ? ?Shannon [name] is a 56 y.o. female. ? ? Patient as above with significant medical history as below, including GERD, hypokalemia, peripheral edema, bipolar 1 disorder who presents to the ED with complaint of low potassium, hip pain left. ? ?Patient recently traveled to [place], when she was there she had a mechanical fall and landed on her left hip, buttock region.  Was seen at outside facility, negative x-rays that time.  Discharged in stable condition.  She is also having some pedal edema, started on Lasix by PCP.  She is found to have hypokalemia at OSH yesterday but left prior to completing services, she was seen by PCP earlier and sent to the ER for evaluation of her hypokalemia.  Patient does report that her leg swelling has significantly reduced with compression stocking and elevation.  She did stop taking the Lasix.  She is having some ongoing pain to her left hip.  Worse with ambulation or movement.  She is having some intermittent muscle spasms to her legs.  Cramping in her toes.  ? ? ?Past Medical History: ?No date: Allergic rhinitis ?01/25/2009: Allergic rhinitis ?    Comment:     ?01/25/2009: ANEMIA-IRON DEFICIENCY ?No date: Anxiety ?01/25/2009: Backache ?    Comment:  Qualifier: Diagnosis of  By: Jenny Reichmann MD, Hunt Oris  ?No date: Bipolar 1 disorder ([place]) ?No date: Chronic pain syndrome ?07/21/2013: Complete rotator cuff tear ?07/21/2013: Complete tear of right rotator cuff ?No date: Contact lens/glasses fitting ?01/25/2009: [place] DISEASE, LUMBAR ?01/25/2009: Essential hypertension ?    Comment:  On Bystolic  ?12/28/2013: GAD (generalized anxiety disorder) ?No date: GERD (gastroesophageal reflux disease) ?06/28/2013: Glenohumeral arthritis ?12/20/2013: Headache, acute ?09/03/2010: Hx of laparoscopic gastric  banding ?    Comment:  Surgery date: 07/17/10  ?01/25/2009: HYPERLIPIDEMIA ?01/25/2009: Hyperlipidemia with target LDL less than 130 ?01/25/2009: HYPERTENSION ?09/01/2011: Hypokalemia, inadequate intake ?04/04/2011: Insomnia ?04/30/2009: INSOMNIA-SLEEP DISORDER-UNSPEC ?    Comment:  Qualifier: Diagnosis of  By: Jenny Reichmann MD, Hunt Oris  ?01/25/2009: Iron deficiency anemia ?    Comment:     ?02/19/2014: Labyrinthitis ?08/13/2008: Leiomyoma of uterus, unspecified ?    Comment:  Overview:  Leiomyoma Of The Uterus  10/1 IMO update ?06/12/2015: Localized edema ?01/25/2009: MANIC DEPRESSIVE ILLNESS ?    Comment:  pt is unsue if this is her specifc dx ?07/12/2012: Mixed bipolar I disorder ([place]) ?01/25/2009: Morbid obesity ([place]) ?No date: Night sweats ?10/26/2012: Other abnormal glucose ?10/25/2013: Palpitations ?10/03/2009: PEPTIC ULCER DISEASE, HELICOBACTER PYLORI POSITIVE ?06/25/2015: Peripheral edema ?03/28/2012: Pernicious anemia ?09/02/2018: Piriformis syndrome of both sides ?10/03/2009: PUD (peptic ulcer disease) ?    Comment:     ?05/03/2013: Right shoulder pain ?    Comment:  Dg Shoulder Right  05/03/2013   CLINICAL DATA Pain.  EXAM ?             RIGHT SHOULDER - 2+ VIEW  COMPARISON None.  FINDINGS  ?             Acromioclavicular and glenohumeral degenerative change  ?             present. Questionable calcific density noted in the  ?             region of the supraspinatus space, possibly representing  ?  calcific supraspinatus tendinitis. This could represent a ?             sclerotic density in the acromion. MRI of the right  ?             shoulder suggest ?08/12/2016: Sleep apnea ?01/25/2009: SMOKER ?    Comment:  Qualifier: Diagnosis of  By: Jenny Reichmann MD, Hunt Oris  ?05/17/2013: Subacromial bursitis ?11/06/2009: SUBSTANCE ABUSE ?    Comment:     ?10/30/2012: Thiamin deficiency ?06/09/2010: Tobacco abuse ?05/03/2013: Visual disturbance ?10/27/2012: Vitamin D deficiency ? ?Past Surgical History: ?No date: ABDOMINAL HYSTERECTOMY ?No date: BLADDER  SURGERY ?    Comment:  s/p with ?diverticulitis ?1991: HAND TENDON SURGERY ?    Comment:  s/p-Right-index and middle ?08/17/2016: LAPAROSCOPIC GASTRIC BAND REMOVAL WITH LAPAROSCOPIC  ?GASTRIC SLEEVE RESECTION; N/A ?    Comment:  Procedure: LAPAROSCOPIC GASTRIC BAND REMOVAL WITH  ?             LAPAROSCOPIC GASTRIC SLEEVE RESECTION WITH UPPER ENDO;   ?             Surgeon: Alphonsa Overall, MD;  Location: [facility];  Service:  ?             General;  Laterality: N/A; ?07/14/10: LAPAROSCOPIC GASTRIC BANDING ?07/21/2013: SHOULDER ARTHROSCOPY; Right ?    Comment:  Procedure: RIGHT ARTHROSCOPY SHOULDER DEBRIDMENT  ?             EXTENTSIVE,ARTHROSCOPIC REMOVE LOOSE FOREIGN BODY, BICEPS ?             TENOLYSIS ;  Surgeon: Johnny Bridge, MD;  Location:  ?             [facility];  Service: Orthopedics;   ?             Laterality: Right; ?No date: TUBAL LIGATION  ? ? ?The history is provided by the patient. No language interpreter was used.  ?Leg Pain ?Associated symptoms: back pain   ?Associated symptoms: no fever   ? ?  ? ?Home Medications ?Prior to Admission medications   ?Medication Sig Start Date End Date Taking? Authorizing Provider  ?ARIPiprazole (ABILIFY) 5 MG tablet Take 1 tablet (5 mg total) by mouth daily. 06/03/21 06/03/22 Yes Arfeen, Arlyce Harman, MD  ?clonazePAM (KLONOPIN) 0.5 MG tablet Take 1 tablet (0.5 mg total) by mouth 3 (three) times daily as needed for anxiety. 06/03/21  Yes Arfeen, Arlyce Harman, MD  ?FLUoxetine (PROZAC) 20 MG capsule Take 1 capsule (20 mg total) by mouth daily. 06/03/21  Yes Arfeen, Arlyce Harman, MD  ?HYDROcodone-acetaminophen (NORCO/VICODIN) 5-325 MG tablet Take 2 tablets by mouth every 4 (four) hours as needed. 06/10/21  Yes Wynona Dove A, DO  ?pantoprazole (PROTONIX) 40 MG tablet Take 1 tablet (40 mg total) by mouth 2 (two) times daily before a meal. 05/21/21  Yes Henson, [name], NP-C  ?QUEtiapine (SEROQUEL) 300 MG tablet Take 1 tablet (300 mg total) by mouth at bedtime. 06/03/21 06/03/22 Yes Arfeen,  Arlyce Harman, MD  ?amLODipine (NORVASC) 5 MG tablet Take 1 tablet (5 mg total) by mouth daily. ?Patient not taking: Reported on 06/10/2021 05/14/21   Harland Dingwall L, NP-C  ?atorvastatin (LIPITOR) 20 MG tablet Take 1 tablet (20 mg total) by mouth daily. ?Patient not taking: Reported on 06/10/2021 05/15/21   Harland Dingwall L, NP-C  ?potassium chloride (KLOR-CON M) 10 MEQ tablet Take 1 tablet (10 mEq total) by mouth daily. ?Patient not taking: Reported on 06/10/2021  05/15/21   Girtha Rm, NP-C  ?   ? ?Allergies    ?Lamictal [lamotrigine]   ? ?Review of Systems   ?Review of Systems  ?Constitutional:  Negative for chills and fever.  ?HENT:  Negative for facial swelling and trouble swallowing.   ?Eyes:  Negative for photophobia and visual disturbance.  ?Respiratory:  Negative for cough and shortness of breath.   ?Cardiovascular:  Positive for leg swelling. Negative for chest pain and palpitations.  ?Gastrointestinal:  Negative for abdominal pain, nausea and vomiting.  ?Endocrine: Negative for polydipsia and polyuria.  ?Genitourinary:  Negative for difficulty urinating and hematuria.  ?Musculoskeletal:  Positive for arthralgias and back pain. Negative for gait problem and joint swelling.  ?     Myalgia  ?Skin:  Negative for pallor and rash.  ?Neurological:  Negative for syncope and headaches.  ?Psychiatric/Behavioral:  Negative for agitation and confusion.   ? ?Physical Exam ?Updated Vital Signs ?BP (!) 148/70   Pulse 75   Temp 98.3 ?F (36.8 ?C) (Oral)   Resp 20   Ht '5\' 4"'$  (1.626 m)   Wt 130.2 kg   SpO2 95%   BMI 49.26 kg/m?  ?Physical Exam ?Vitals and nursing note reviewed.  ?Constitutional:   ?   General: She is not in acute distress. ?   Appearance: Normal appearance. She is obese. She is not ill-appearing.  ?HENT:  ?   Head: Normocephalic and atraumatic.  ?   Right Ear: External ear normal.  ?   Left Ear: External ear normal.  ?   Nose: Nose normal.  ?   Mouth/Throat:  ?   Mouth: Mucous membranes are moist.  ?Eyes:   ?   General: No scleral icterus.    ?   Right eye: No discharge.     ?   Left eye: No discharge.  ?Cardiovascular:  ?   Rate and Rhythm: Normal rate and regular rhythm.  ?   Pulses: Normal pulses.  ?   Heart sounds: Nor

## 2021-06-10 NOTE — ED Notes (Signed)
Pt O2 dropped to 80 while sleeping. Encouraged to take deep breath but O2 only came up to 81% RA, 2 liters O2 placed at this time ? ?

## 2021-06-10 NOTE — ED Notes (Signed)
Patient returned back from CT at this time.  °

## 2021-06-10 NOTE — ED Notes (Signed)
Pt removed her IV at this time, charge nurse called to bedside.  ?

## 2021-06-10 NOTE — ED Triage Notes (Signed)
Per EMS-coming from PCP office-recently started on lasix-depleted potassium-K+ 2.4-having muscle cramps and palpitations this am which have resolved ?

## 2021-06-10 NOTE — ED Notes (Signed)
Pt upset at this time, stating she does not want anymore bags of potassium. Say writer keep going in and out of her room, moving so quickly and she is tired of it. She says Probation officer is not addressing any of her needs. Writer apologized and explained that she has to move fast to meet the demands. Pt told me to get out of her room and do not return.  ?

## 2021-06-10 NOTE — ED Notes (Signed)
Pt removed vital sign monitoring devices. 

## 2021-06-10 NOTE — ED Notes (Signed)
I provided reinforced discharge education based off of discharge instructions. Pt acknowledged and understood my education. Pt had no further questions/concerns for provider/myself.  °

## 2021-06-10 NOTE — Discharge Instructions (Addendum)
It was a pleasure caring for you today in the emergency department. ? ?Please return to the emergency department for any worsening or worrisome symptoms. ? ?Try to eat a food that is high in potassium with each meal.  Try to see your family doctor within the week to have this rechecked.  If they are unable to see you then you can go to an urgent care center or come back to the ER and have it rechecked. ? ?Take 4 over the counter ibuprofen tablets 3 times a day or 2 over-the-counter naproxen tablets twice a day for pain. ?Also take tylenol '1000mg'$ (2 extra strength) four times a day.  ? ?Then take the pain medicine if you feel like you need it. Narcotics do not help with the pain, they only make you care about it less.  You can become addicted to this, people may break into your house to steal it.  It will constipate you.  If you drive under the influence of this medicine you can get a DUI.   ? ? ?

## 2021-06-12 NOTE — Telephone Encounter (Signed)
Rec'd msg stating " The original prescription was discontinued on 06/10/2021 by Deno Etienne, DO'. Pls advise on refill.Marland KitchenJohny Chess ?

## 2021-06-13 ENCOUNTER — Encounter: Payer: Self-pay | Admitting: Family Medicine

## 2021-06-13 ENCOUNTER — Telehealth (INDEPENDENT_AMBULATORY_CARE_PROVIDER_SITE_OTHER): Payer: Medicare Other | Admitting: Family Medicine

## 2021-06-13 DIAGNOSIS — R262 Difficulty in walking, not elsewhere classified: Secondary | ICD-10-CM | POA: Diagnosis not present

## 2021-06-13 DIAGNOSIS — M25552 Pain in left hip: Secondary | ICD-10-CM | POA: Insufficient documentation

## 2021-06-13 DIAGNOSIS — M169 Osteoarthritis of hip, unspecified: Secondary | ICD-10-CM | POA: Insufficient documentation

## 2021-06-13 DIAGNOSIS — E876 Hypokalemia: Secondary | ICD-10-CM

## 2021-06-13 DIAGNOSIS — M8580 Other specified disorders of bone density and structure, unspecified site: Secondary | ICD-10-CM | POA: Insufficient documentation

## 2021-06-13 DIAGNOSIS — M1612 Unilateral primary osteoarthritis, left hip: Secondary | ICD-10-CM | POA: Diagnosis not present

## 2021-06-13 HISTORY — DX: Other specified disorders of bone density and structure, unspecified site: M85.80

## 2021-06-13 HISTORY — DX: Osteoarthritis of hip, unspecified: M16.9

## 2021-06-13 MED ORDER — HYDROCODONE-ACETAMINOPHEN 10-325 MG PO TABS: 1.0000 | ORAL_TABLET | Freq: Three times a day (TID) | ORAL | 0 refills | Status: AC | PRN

## 2021-06-13 NOTE — Progress Notes (Signed)
? ? ?MyChart Video Visit ? ? ? ?Virtual Visit via Video Note  ? ?This visit type was conducted due to national recommendations for restrictions regarding the COVID-19 Pandemic (e.g. social distancing) in an effort to limit this patient's exposure and mitigate transmission in our community. This patient is at least at moderate risk for complications without adequate follow up. This format is felt to be most appropriate for this patient at this time. Physical exam was limited by quality of the video and audio technology used for the visit. CMA was able to get the patient set up on a video visit. ? ?Patient location: Home. Patient and provider in visit ?Provider location: Office ? ?I discussed the limitations of evaluation and management by telemedicine and the availability of in person appointments. The patient expressed understanding and agreed to proceed. ? ?Visit Date: 06/13/2021 ? ?Today's healthcare provider: Harland Dingwall, NP-C  ? ? ? ?Subjective:  ? ? Patient ID: Shannon [name], female    DOB: 10-15-1965, 56 y.o.   MRN: [id number] ? ?Chief Complaint  ?Patient presents with  ? Hospitalization Follow-up  ? Hip Pain  ? ? ?HPI ? ?Patient was scheduled for in office visit and preferred to switch to virtual due to difficulty walking r/t hip pain.  ? ?She has had approximately a 2-week history of left hip pain post fall while out of town.  She was seen at a clinic in [place] for her fall and x-rays of her left hip were done showing no acute fracture or dislocation. ?Since then she was seen in the [facility] ED for left hip pain as well and a CT scan was done, negative for avascular necrosis. ? ?Imaging of left hip did show mild osteoarthritis with joint space narrowing and small marginal osteophytes. ? ?She is requesting pain medication so that she can attend Ramadan tomorrow.  States her pain is so severe that she has difficulty walking.  She was prescribed morphine and hydrocodone-acetaminophen 5-325 mg in the emergency  department. ? ?She has seen Dr. Hulan Saas and Dr. Marchia Bond in the past for lumbar and sacral issues.  She would like to schedule with Dr. Tamala Julian for hip pain. ? ?Hypokalemia- evaluated and treated in the ED for potassium 2.5. she is currently taking oral potassium 20 MEQ and will take this for the next 4 days and then stop.  She reports improving potassium intake in her diet.  ?She will need to have her potassium level rechecked at that point. ? ?Denies fever, chills, chest pain, palpitations, shortness of breath, abdominal pain, vomiting or diarrhea. ? ? ?Neck and thyroid US were scheduled and then had to be canceled due to her hip pain ? ??OSA- home sleep test ordered.  ? ? ? ? ?Past Medical History:  ?Diagnosis Date  ? Allergic rhinitis   ? Allergic rhinitis 01/25/2009  ?     ? ANEMIA-IRON DEFICIENCY 01/25/2009  ? Anxiety   ? Backache 01/25/2009  ? Qualifier: Diagnosis of  By: Jenny Reichmann MD, Hunt Oris   ? Bipolar 1 disorder ([place])   ? Chronic pain syndrome   ? Complete rotator cuff tear 07/21/2013  ? Complete tear of right rotator cuff 07/21/2013  ? Contact lens/glasses fitting   ? 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  ? [place] DISEASE, LUMBAR 01/25/2009  ? Essential hypertension 01/25/2009  ? On Bystolic   ? GAD (generalized anxiety disorder) 12/28/2013  ? GERD (gastroesophageal reflux disease)   ? 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  Headache, acute 12/20/2013  ? Hx of laparoscopic gastric banding 09/03/2010  ? Surgery date: 07/17/10   ? HYPERLIPIDEMIA 01/25/2009  ? Hyperlipidemia with target LDL less than 130 01/25/2009  ? HYPERTENSION 01/25/2009  ? Hypokalemia, inadequate intake 09/01/2011  ? Insomnia 04/04/2011  ? INSOMNIA-SLEEP DISORDER-UNSPEC 04/30/2009  ? Qualifier: Diagnosis of  By: Jenny Reichmann MD, Hunt Oris   ? Iron deficiency anemia 01/25/2009  ?     ? Labyrinthitis 02/19/2014  ? Leiomyoma of uterus, unspecified 08/13/2008  ? Overview:  Leiomyoma Of The Uterus  10/1 IMO update  ? Localized edema 06/12/2015  ? MANIC  DEPRESSIVE ILLNESS 01/25/2009  ? pt is unsue if this is her specifc dx  ? Mixed bipolar I disorder ([place]) 07/12/2012  ? Morbid obesity ([place]) 01/25/2009  ? Night sweats   ? Osteopenia determined by x-ray 06/13/2021  ? Other abnormal glucose 10/26/2012  ? Palpitations 10/25/2013  ? PEPTIC ULCER DISEASE, HELICOBACTER PYLORI POSITIVE 10/03/2009  ? Peripheral edema 06/25/2015  ? Pernicious anemia 03/28/2012  ? Piriformis syndrome of both sides 09/02/2018  ? PUD (peptic ulcer disease) 10/03/2009  ?     ? Right shoulder pain 05/03/2013  ? Dg Shoulder Right  05/03/2013   CLINICAL DATA Pain.  EXAM RIGHT SHOULDER - 2+ VIEW  COMPARISON None.  FINDINGS Acromioclavicular and glenohumeral degenerative change present. Questionable calcific density noted in the region of the supraspinatus space, possibly representing calcific supraspinatus tendinitis. This could represent a sclerotic density in the acromion. MRI of the right shoulder suggest  ? Sleep apnea 08/12/2016  ? SMOKER 01/25/2009  ? Qualifier: Diagnosis of  By: Jenny Reichmann MD, Hunt Oris   ? Subacromial bursitis 05/17/2013  ? SUBSTANCE ABUSE 11/06/2009  ?     ? Thiamin deficiency 10/30/2012  ? Tobacco abuse 06/09/2010  ? Visual disturbance 05/03/2013  ? Vitamin D deficiency 10/27/2012  ? ? ?Past Surgical History:  ?Procedure Laterality Date  ? ABDOMINAL HYSTERECTOMY    ? BLADDER SURGERY    ? s/p with ?diverticulitis  ? HAND TENDON SURGERY  1991  ? s/p-Right-index and middle  ? LAPAROSCOPIC GASTRIC BAND REMOVAL WITH LAPAROSCOPIC GASTRIC SLEEVE RESECTION N/A 08/17/2016  ? Procedure: LAPAROSCOPIC GASTRIC BAND REMOVAL WITH LAPAROSCOPIC GASTRIC SLEEVE RESECTION WITH UPPER ENDO;  Surgeon: Alphonsa Overall, MD;  Location: [facility];  Service: General;  Laterality: N/A;  ? LAPAROSCOPIC GASTRIC BANDING  07/14/10  ? SHOULDER ARTHROSCOPY Right 07/21/2013  ? Procedure: RIGHT ARTHROSCOPY SHOULDER DEBRIDMENT EXTENTSIVE,ARTHROSCOPIC REMOVE LOOSE FOREIGN BODY, BICEPS TENOLYSIS ;  Surgeon: Johnny Bridge, MD;  Location: [facility];  Service: Orthopedics;  Laterality: Right;  ? TUBAL LIGATION    ? ? ?Family History  ?Problem Relation Age of Onset  ? Hypertension Mother   ? Asthma Mother   ? Stroke Father   ? Cirrhosis Father   ?     ETOH  ? Hypertension Father   ? Diabetes Father   ? Alcohol abuse Father   ? Hypertension Sister   ? Asthma Sister   ? Hypertension Brother   ? ADD / ADHD Son   ? Hypertension Paternal Aunt   ? Diabetes Maternal Grandmother   ? ADD / ADHD Other   ?     3 nephews, also emotional issues  ? Diabetes Other   ?     Uncle  ? Diabetes Other   ?     Aunt  ? Suicidality Neg Hx   ? ? ?Social History  ? ?Socioeconomic History  ?  Marital status: Married  ?  Spouse name: Not on file  ? Number of children: 2  ? Years of education: Not on file  ? Highest education level: Not on file  ?Occupational History  ? Occupation: STUDENT  ?  Employer: UNEMPLOYED  ?Tobacco Use  ? Smoking status: Every Day  ?  Packs/day: 0.25  ?  Years: 20.00  ?  Pack years: 5.00  ?  Types: Cigarettes  ? Smokeless tobacco: Never  ? Tobacco comments:  ?  States has cut back to 5-7 a day and working on this.   ?Vaping Use  ? Vaping Use: Never used  ?Substance and Sexual Activity  ? Alcohol use: No  ?  Alcohol/week: 0.0 standard drinks  ? Drug use: No  ? Sexual activity: Not Currently  ?Other Topics Concern  ? Not on file  ?Social History Narrative  ? Moved from [place], then [place] to [place] 2009  ? 2 children-1 boy, 1 girl  ? Disabled-bipolar  ? Daily Caffeine Use-1 cup/day  ? ?Social Determinants of Health  ? ?Financial Resource Strain: Not on file  ?Food Insecurity: Not on file  ?Transportation Needs: Not on file  ?Physical Activity: Not on file  ?Stress: Not on file  ?Social Connections: Not on file  ?Intimate Partner Violence: Not on file  ? ? ?Outpatient Medications Prior to Visit  ?Medication Sig Dispense Refill  ? ARIPiprazole (ABILIFY) 5 MG tablet Take 1 tablet (5 mg total) by mouth daily. 30 tablet 1  ? clonazePAM (KLONOPIN) 0.5 MG  tablet Take 1 tablet (0.5 mg total) by mouth 3 (three) times daily as needed for anxiety. 75 tablet 1  ? FLUoxetine (PROZAC) 20 MG capsule Take 1 capsule (20 mg total) by mouth daily. 30 capsule 1  ? pantoprazole (P

## 2021-06-13 NOTE — Assessment & Plan Note (Signed)
She is currently taking 40 MEQ of potassium p.o. twice daily and will be finished with this dosing on 06/17/2021.  Discussed that she will need to come in when she completes those to have her potassium level rechecked.  She has since increased dietary potassium intake. No longer having palpitations or muscle cramps as when hypokalemic.  ?

## 2021-06-13 NOTE — Assessment & Plan Note (Addendum)
Reviewed x-ray results from [place] and [facility] ED notes, labs and imaging results.  Hydrocodone-acetaminophen 10-325 mg prescribed.  PDMP was reviewed.  Urgent referral to sports medicine. ?

## 2021-06-16 NOTE — Progress Notes (Signed)
? ? Benito Mccreedy D.Merril Abbe ?[facility] ?[place] ?Phone: [phone] ?  ?Assessment and Plan:   ? ?1. Primary osteoarthritis of left hip ?2. Left hip pain ?-Chronic with exacerbation ?-Consistent with acute flare of left hip osteoarthritis after patient's recent fall and then subsequent right hip and low back discomfort worsening because of patient's compensation.  Treatment goal to improve left hip pain so that patient does not have to compensate and then other pain she should improve ?- Reviewed CT left hip with patient and her husband in the room.  My interpretation: No acute fracture or dislocation.  Cortical changes of femoral head and acetabulum consistent with mild to moderate osteoarthritis ?- Offered CSI intra-articular left hip versus p.o. medication treatment.  Patient elected to start with p.o. medication treatment ?- Start meloxicam 15 mg daily x 3 weeks.  May use remaining meloxicam as needed once daily for pain control.  Do not to use additional NSAIDs while taking meloxicam.  May use Tylenol [phone] mg 2 to 3 times a day for breakthrough pain.  ?-Start Flexeril 10 mg nightly as needed for muscle spasms ? ?Pertinent previous records reviewed include ER note 06/10/21, CT left hip 06/10/2021, lumbar spine x-ray 09/01/2018 ?  ?Follow Up: 3 weeks for reevaluation.  If improving, would discuss HEP +/- referral to PT.  If no improving or worsening of symptoms, would proceed with intra-articular hip CSI ?  ?Subjective:   ?I, Pincus Badder, am serving as a Education administrator for [employer] ? ?Chief Complaint: left hip pain  ? ?HPI:  ? ?06/17/2021 ?Patient is a 56 year old female complaining of left hip pain. Patient states that she fell down 2 and a half weeks ago, went to ER xray and everything negative legs and feet started swelling , went to ER mri negative, cant bare weight on left leg with out being pain isn't able to walk with a normal gait, is a  side sleep so she isn't able to sleep due the joint pain , does get numbness and tingling - sometimes, pain stays in between left and right hip joint , been taking ib and tylenol Excedrin and Advil, nothing really seems to help, no clicking snapping or popping in the hip, needs something for pain control  ? ?Relevant Historical Information: Elevated BMI, hypertension, chronic low back pain ? ?Additional pertinent review of systems negative. ? ? ?Current Outpatient Medications:  ?  amLODipine (NORVASC) 5 MG tablet, Take 1 tablet (5 mg total) by mouth daily., Disp: 30 tablet, Rfl: 2 ?  ARIPiprazole (ABILIFY) 5 MG tablet, Take 1 tablet (5 mg total) by mouth daily., Disp: 30 tablet, Rfl: 1 ?  atorvastatin (LIPITOR) 20 MG tablet, Take 1 tablet (20 mg total) by mouth daily., Disp: 90 tablet, Rfl: 1 ?  clonazePAM (KLONOPIN) 0.5 MG tablet, Take 1 tablet (0.5 mg total) by mouth 3 (three) times daily as needed for anxiety., Disp: 75 tablet, Rfl: 1 ?  cyclobenzaprine (FLEXERIL) 10 MG tablet, Take 1 tablet (10 mg total) by mouth at bedtime as needed for muscle spasms., Disp: 30 tablet, Rfl: 0 ?  FLUoxetine (PROZAC) 20 MG capsule, Take 1 capsule (20 mg total) by mouth daily., Disp: 30 capsule, Rfl: 1 ?  HYDROcodone-acetaminophen (NORCO) 10-325 MG tablet, Take 1 tablet by mouth every 8 (eight) hours as needed for up to 5 days., Disp: 15 tablet, Rfl: 0 ?  meloxicam (MOBIC) 15 MG tablet, Take 1 tablet (15 mg  total) by mouth daily., Disp: 30 tablet, Rfl: 0 ?  pantoprazole (PROTONIX) 40 MG tablet, Take 1 tablet (40 mg total) by mouth 2 (two) times daily before a meal., Disp: 60 tablet, Rfl: 2 ?  potassium chloride SA (KLOR-CON M) 20 MEQ tablet, Take 2 tablets (40 mEq total) by mouth 2 (two) times daily for 7 days., Disp: 28 tablet, Rfl: 0 ?  QUEtiapine (SEROQUEL) 300 MG tablet, Take 1 tablet (300 mg total) by mouth at bedtime., Disp: 30 tablet, Rfl: 1  ? ?Objective:   ?  ?Vitals:  ? 06/17/21 1454  ?BP: (!) 148/78  ?Pulse: (!) 102   ?SpO2: 96%  ?Weight: 287 lb (130.2 kg)  ?Height: '5\' 4"'$  (1.626 m)  ?  ?  ?Body mass index is 49.26 kg/m?.  ?  ?Physical Exam:   ? ?General: awake, alert, and oriented no acute distress, nontoxic ?Skin: no suspicious lesions or rashes ?Neuro:sensation intact distally with no dificits, normal muscle tone, no atrophy, strength 5/5 in all tested lower ext groups ?Psych: normal mood and affect, speech clear ? ?Left hip: ?No deformity, swelling or wasting ?ROM Flexion 80, ext 10, IR 30, ER 30 ?TTP over the hip flexors, greater troch, glute musculature, si joint, lumbar spine ?Positive log roll with FROM ?Positive FABER ?Positive FADIR ?  ?Gait abnormal.  Significant groin and lateral hip pain with weightbearing and ambulation ? ? ?Electronically signed by:  ?Benito Mccreedy D.Merril Abbe ?[facility] ?3:28 PM 06/17/21 ?

## 2021-06-17 ENCOUNTER — Ambulatory Visit (INDEPENDENT_AMBULATORY_CARE_PROVIDER_SITE_OTHER): Payer: Medicare Other | Admitting: Sports Medicine

## 2021-06-17 VITALS — BP 148/78 | HR 102 | Ht 64.0 in | Wt 287.0 lb

## 2021-06-17 DIAGNOSIS — M25552 Pain in left hip: Secondary | ICD-10-CM

## 2021-06-17 DIAGNOSIS — M1612 Unilateral primary osteoarthritis, left hip: Secondary | ICD-10-CM | POA: Diagnosis not present

## 2021-06-17 MED ORDER — MELOXICAM 15 MG PO TABS: 15.0000 mg | ORAL_TABLET | Freq: Every day | ORAL | 0 refills | Status: DC

## 2021-06-17 MED ORDER — CYCLOBENZAPRINE HCL 10 MG PO TABS: 10.0000 mg | ORAL_TABLET | Freq: Every evening | ORAL | 0 refills | Status: DC | PRN

## 2021-06-17 NOTE — Patient Instructions (Addendum)
Good to see you  ?Start meloxicam 15 mg daily x 3 weeks. May use remaining meloxicam as needed once daily for pain control.  Do not to use (ibuprofen, naproxen,aleve) additional NSAIDs while taking meloxicam.  May use Tylenol [phone] mg 2 to 3 times a day for breakthrough pain. ?Flexeril 10 mg at bed time as needed for muscle spasm  ?3 week follow up  ? ? ?

## 2021-06-23 ENCOUNTER — Telehealth: Payer: Self-pay | Admitting: Sports Medicine

## 2021-06-23 NOTE — Telephone Encounter (Signed)
Pt does not think the flexeril is helping her sleep and it seems to be causing acid reflux. ? ?She is struggling with sleep and would like to try something else. ?

## 2021-06-24 ENCOUNTER — Other Ambulatory Visit: Payer: Self-pay | Admitting: Sports Medicine

## 2021-06-24 MED ORDER — TIZANIDINE HCL 4 MG PO TABS: 4.0000 mg | ORAL_TABLET | Freq: Every evening | ORAL | 0 refills | Status: DC

## 2021-06-24 MED ORDER — TIZANIDINE HCL 4 MG PO TABS: 4.0000 mg | ORAL_TABLET | Freq: Four times a day (QID) | ORAL | 0 refills | Status: DC | PRN

## 2021-06-24 NOTE — Telephone Encounter (Signed)
Zanaflax 4 mg 15 0 was put in. Patient will be called and notfied ?

## 2021-06-30 NOTE — Progress Notes (Deleted)
    Benito Mccreedy D.[place] Phone: [phone]   Assessment and Plan:     There are no diagnoses linked to this encounter.  ***   Pertinent previous records reviewed include ***   Follow Up: ***     Subjective:   I, Myrene Bougher, am serving as a Education administrator for [employer]   Chief Complaint: left hip pain    HPI:    06/17/2021 Patient is a 56 year old female complaining of left hip pain. Patient states that she fell down 2 and a half weeks ago, went to ER xray and everything negative legs and feet started swelling , went to ER mri negative, cant bare weight on left leg with out being pain isn't able to walk with a normal gait, is a side sleep so she isn't able to sleep due the joint pain , does get numbness and tingling - sometimes, pain stays in between left and right hip joint , been taking ib and tylenol Excedrin and Advil, nothing really seems to help, no clicking snapping or popping in the hip, needs something for pain control   07/01/2021 Patient states   Relevant Historical Information: Elevated BMI, hypertension, chronic low back pain  Additional pertinent review of systems negative.   Current Outpatient Medications:    amLODipine (NORVASC) 5 MG tablet, Take 1 tablet (5 mg total) by mouth daily., Disp: 30 tablet, Rfl: 2   ARIPiprazole (ABILIFY) 5 MG tablet, Take 1 tablet (5 mg total) by mouth daily., Disp: 30 tablet, Rfl: 1   atorvastatin (LIPITOR) 20 MG tablet, Take 1 tablet (20 mg total) by mouth daily., Disp: 90 tablet, Rfl: 1   clonazePAM (KLONOPIN) 0.5 MG tablet, Take 1 tablet (0.5 mg total) by mouth 3 (three) times daily as needed for anxiety., Disp: 75 tablet, Rfl: 1   cyclobenzaprine (FLEXERIL) 10 MG tablet, Take 1 tablet (10 mg total) by mouth at bedtime as needed for muscle spasms., Disp: 30 tablet, Rfl: 0   FLUoxetine (PROZAC) 20 MG capsule, Take 1 capsule (20 mg total) by  mouth daily., Disp: 30 capsule, Rfl: 1   meloxicam (MOBIC) 15 MG tablet, Take 1 tablet (15 mg total) by mouth daily., Disp: 30 tablet, Rfl: 0   pantoprazole (PROTONIX) 40 MG tablet, Take 1 tablet (40 mg total) by mouth 2 (two) times daily before a meal., Disp: 60 tablet, Rfl: 2   potassium chloride SA (KLOR-CON M) 20 MEQ tablet, Take 2 tablets (40 mEq total) by mouth 2 (two) times daily for 7 days., Disp: 28 tablet, Rfl: 0   QUEtiapine (SEROQUEL) 300 MG tablet, Take 1 tablet (300 mg total) by mouth at bedtime., Disp: 30 tablet, Rfl: 1   tiZANidine (ZANAFLEX) 4 MG tablet, Take 1 tablet (4 mg total) by mouth at bedtime., Disp: 15 tablet, Rfl: 0   Objective:     There were no vitals filed for this visit.    There is no height or weight on file to calculate BMI.    Physical Exam:    ***   Electronically signed by:  Benito Mccreedy D.[facility] 12:44 PM 06/30/21

## 2021-07-01 ENCOUNTER — Telehealth: Payer: Self-pay | Admitting: Sports Medicine

## 2021-07-01 ENCOUNTER — Other Ambulatory Visit: Payer: Self-pay | Admitting: Sports Medicine

## 2021-07-01 ENCOUNTER — Ambulatory Visit: Payer: Medicare Other | Admitting: Sports Medicine

## 2021-07-01 ENCOUNTER — Encounter: Payer: Self-pay | Admitting: Sports Medicine

## 2021-07-01 MED ORDER — TIZANIDINE HCL 4 MG PO TABS: 4.0000 mg | ORAL_TABLET | Freq: Every evening | ORAL | 0 refills | Status: DC

## 2021-07-01 MED ORDER — MELOXICAM 15 MG PO TABS: 15.0000 mg | ORAL_TABLET | Freq: Every day | ORAL | 0 refills | Status: DC

## 2021-07-01 NOTE — Telephone Encounter (Signed)
Patient called asking to cancel her appointment today for the hip injection.  ?She said that she is starting to feel some better and would like to wait another week to see how she does. ? ?She is requesting a refill on: ?Tizanidine '4mg'$   ?Meloxicam '15mg'$  ? ?To: [facility]. ?

## 2021-07-01 NOTE — Telephone Encounter (Signed)
Pt was called and notified about the refill of tizanidine and meloxicam ?

## 2021-07-10 ENCOUNTER — Telehealth: Payer: Self-pay | Admitting: Family Medicine

## 2021-07-10 NOTE — Addendum Note (Signed)
Addended by: Rossie Muskrat on: 07/10/2021 03:43 PM   Modules accepted: Orders

## 2021-07-10 NOTE — Telephone Encounter (Signed)
Done, thanks

## 2021-07-10 NOTE — Telephone Encounter (Signed)
I need orders placed for EKG that was done on 06/10/21. Thanks!

## 2021-07-18 ENCOUNTER — Other Ambulatory Visit (HOSPITAL_COMMUNITY): Payer: Self-pay | Admitting: *Deleted

## 2021-07-18 ENCOUNTER — Other Ambulatory Visit: Payer: Self-pay | Admitting: Sports Medicine

## 2021-07-18 DIAGNOSIS — F319 Bipolar disorder, unspecified: Secondary | ICD-10-CM

## 2021-07-18 MED ORDER — QUETIAPINE FUMARATE 300 MG PO TABS: 300.0000 mg | ORAL_TABLET | Freq: Every day | ORAL | 0 refills | Status: DC

## 2021-07-18 MED ORDER — MELOXICAM 15 MG PO TABS: 15.0000 mg | ORAL_TABLET | Freq: Every day | ORAL | 0 refills | Status: DC | PRN

## 2021-07-18 MED ORDER — CYCLOBENZAPRINE HCL 10 MG PO TABS: 10.0000 mg | ORAL_TABLET | Freq: Every evening | ORAL | 0 refills | Status: DC | PRN

## 2021-07-18 NOTE — Telephone Encounter (Signed)
Pt was messaged via my chart to let her know her know that a refill of meloxicam has been placed

## 2021-07-25 ENCOUNTER — Other Ambulatory Visit: Payer: Self-pay | Admitting: Sports Medicine

## 2021-07-25 ENCOUNTER — Telehealth: Payer: Self-pay | Admitting: Family Medicine

## 2021-07-25 NOTE — Telephone Encounter (Signed)
Pt has pink eye, and woke up with her R eye sealed shut from milky discharge. No Pain   Pt would please like an RX called in before she has to leave town at 5:30. Pt says she will f/u with a visit when she returns 6/12.

## 2021-07-25 NOTE — Telephone Encounter (Signed)
Called pt and advised that she will need at least a virtual visit to be assessed before prescribing a medication. Pt states she bought some eye drops at [facility] that have seemed to stopped the drainage and lessened the redness so she will hold off for now but will call and schedule a visit if symptoms continue.

## 2021-07-29 ENCOUNTER — Ambulatory Visit (HOSPITAL_BASED_OUTPATIENT_CLINIC_OR_DEPARTMENT_OTHER): Payer: Medicare Other | Attending: Family Medicine | Admitting: Internal Medicine

## 2021-08-04 ENCOUNTER — Telehealth: Payer: Self-pay | Admitting: Family Medicine

## 2021-08-04 ENCOUNTER — Telehealth (HOSPITAL_BASED_OUTPATIENT_CLINIC_OR_DEPARTMENT_OTHER): Payer: Medicare Other | Admitting: Psychiatry

## 2021-08-04 ENCOUNTER — Encounter (HOSPITAL_COMMUNITY): Payer: Self-pay | Admitting: Psychiatry

## 2021-08-04 VITALS — Wt 276.0 lb

## 2021-08-04 DIAGNOSIS — F609 Personality disorder, unspecified: Secondary | ICD-10-CM

## 2021-08-04 DIAGNOSIS — F4310 Post-traumatic stress disorder, unspecified: Secondary | ICD-10-CM | POA: Diagnosis not present

## 2021-08-04 DIAGNOSIS — F319 Bipolar disorder, unspecified: Secondary | ICD-10-CM | POA: Diagnosis not present

## 2021-08-04 DIAGNOSIS — E876 Hypokalemia: Secondary | ICD-10-CM

## 2021-08-04 MED ORDER — FLUOXETINE HCL 20 MG PO CAPS: 20.0000 mg | ORAL_CAPSULE | Freq: Every day | ORAL | 1 refills | Status: DC

## 2021-08-04 MED ORDER — LORAZEPAM 0.5 MG PO TABS: 0.5000 mg | ORAL_TABLET | Freq: Two times a day (BID) | ORAL | 0 refills | Status: DC | PRN

## 2021-08-04 MED ORDER — ARIPIPRAZOLE 5 MG PO TABS: 5.0000 mg | ORAL_TABLET | Freq: Every day | ORAL | 1 refills | Status: DC

## 2021-08-04 MED ORDER — QUETIAPINE FUMARATE 300 MG PO TABS: 300.0000 mg | ORAL_TABLET | Freq: Every day | ORAL | 1 refills | Status: DC
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
## 2021-08-04 NOTE — Telephone Encounter (Signed)
Patient called and wants to know why she is not on a prescription potassium supplement - Her psychiatrist questioned this.  Please advise.

## 2021-08-04 NOTE — Telephone Encounter (Signed)
Pt is wondering why she is not on a potassium supplement per her psychiatrist.    Sent pt mychart message letting her know provider is out of office this week in case she does not receive a response until next week.

## 2021-08-06 ENCOUNTER — Telehealth (HOSPITAL_COMMUNITY): Payer: Self-pay | Admitting: *Deleted

## 2021-08-06 ENCOUNTER — Other Ambulatory Visit (HOSPITAL_COMMUNITY): Payer: Self-pay | Admitting: *Deleted

## 2021-08-06 DIAGNOSIS — F319 Bipolar disorder, unspecified: Secondary | ICD-10-CM

## 2021-08-06 DIAGNOSIS — F4310 Post-traumatic stress disorder, unspecified: Secondary | ICD-10-CM

## 2021-08-06 MED ORDER — CLONAZEPAM 0.5 MG PO TABS: 0.5000 mg | ORAL_TABLET | Freq: Three times a day (TID) | ORAL | 1 refills | Status: DC | PRN

## 2021-08-06 MED ORDER — QUETIAPINE FUMARATE 25 MG PO TABS: 25.0000 mg | ORAL_TABLET | Freq: Two times a day (BID) | ORAL | 1 refills | Status: DC

## 2021-08-06 NOTE — Telephone Encounter (Signed)
Discontinuing Ativan.  Restart Klonopin 0.5 mg up to 3 times a day.  If she agree we can also start low-dose Seroquel 25 mg twice a day and keep all her other medication.  We discussed therapy however we do not have any therapist who is taking new patient.  Can you please provide referral for therapy.

## 2021-08-06 NOTE — Telephone Encounter (Signed)
Pt called in regarding this appointment. No labs have been ordered. Please order labs and call patient to schedule.

## 2021-08-06 NOTE — Telephone Encounter (Signed)
Pt agrees after med ed imitated, explained. Will call scripts in.

## 2021-08-06 NOTE — Telephone Encounter (Signed)
Pt called stating that she had an "extreme panic attack" at [facility] yesterday and 911 was actually called. Pt stated that she took 4 of the new medication that was supposed to work better than the [place] she says; Ativan 0.5 mg. Pt says that it "did nothing at all" stating that the Ativan didn't even touch me. Pt says this is unacceptable and she wants something else that will work better, faster. Pt next scheduled appointment is on 09/09/21. Please review and advise.

## 2021-08-07 ENCOUNTER — Other Ambulatory Visit: Payer: Medicare Other

## 2021-08-07 NOTE — Telephone Encounter (Addendum)
Called pt and she will be coming in today at 4 to complete those labs.  Orders placed.

## 2021-08-07 NOTE — Telephone Encounter (Signed)
Labs ordered. Pt will come in today

## 2021-08-15 ENCOUNTER — Other Ambulatory Visit: Payer: Self-pay | Admitting: *Deleted

## 2021-08-15 DIAGNOSIS — R600 Localized edema: Secondary | ICD-10-CM

## 2021-09-05 ENCOUNTER — Encounter (HOSPITAL_COMMUNITY): Payer: Medicare Other

## 2021-09-05 ENCOUNTER — Encounter: Payer: Medicare Other | Admitting: Vascular Surgery

## 2021-09-09 ENCOUNTER — Telehealth (HOSPITAL_BASED_OUTPATIENT_CLINIC_OR_DEPARTMENT_OTHER): Payer: Medicare Other | Admitting: Psychiatry

## 2021-09-09 ENCOUNTER — Encounter (HOSPITAL_COMMUNITY): Payer: Self-pay | Admitting: Psychiatry

## 2021-09-09 VITALS — Wt 287.0 lb

## 2021-09-09 DIAGNOSIS — F4310 Post-traumatic stress disorder, unspecified: Secondary | ICD-10-CM | POA: Diagnosis not present

## 2021-09-09 DIAGNOSIS — F319 Bipolar disorder, unspecified: Secondary | ICD-10-CM | POA: Diagnosis not present

## 2021-09-09 DIAGNOSIS — F609 Personality disorder, unspecified: Secondary | ICD-10-CM | POA: Diagnosis not present

## 2021-09-09 MED ORDER — QUETIAPINE FUMARATE 25 MG PO TABS
25.0000 mg | ORAL_TABLET | Freq: Two times a day (BID) | ORAL | 1 refills | Status: DC
Start: 2021-09-09 — End: 2021-11-18

## 2021-09-09 MED ORDER — FLUOXETINE HCL 20 MG PO CAPS: 20.0000 mg | ORAL_CAPSULE | Freq: Every day | ORAL | 1 refills | Status: DC

## 2021-09-09 MED ORDER — CLONAZEPAM 0.5 MG PO TABS: 0.5000 mg | ORAL_TABLET | Freq: Three times a day (TID) | ORAL | 1 refills | Status: DC | PRN

## 2021-09-09 MED ORDER — QUETIAPINE FUMARATE 300 MG PO TABS: 300.0000 mg | ORAL_TABLET | Freq: Every day | ORAL | 1 refills | Status: DC
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
## 2021-10-03 ENCOUNTER — Ambulatory Visit: Payer: Medicare Other | Admitting: Family Medicine

## 2021-10-07 ENCOUNTER — Ambulatory Visit (INDEPENDENT_AMBULATORY_CARE_PROVIDER_SITE_OTHER): Payer: Medicare Other

## 2021-10-07 ENCOUNTER — Other Ambulatory Visit: Payer: Self-pay

## 2021-10-07 ENCOUNTER — Ambulatory Visit: Payer: Medicare Other | Admitting: Family Medicine

## 2021-10-07 DIAGNOSIS — Z0000 Encounter for general adult medical examination without abnormal findings: Secondary | ICD-10-CM

## 2021-10-07 DIAGNOSIS — I10 Essential (primary) hypertension: Secondary | ICD-10-CM

## 2021-10-07 NOTE — Patient Instructions (Signed)
Shannon [name] , Thank you for taking time to come for your Medicare Wellness Visit. I appreciate your ongoing commitment to your health goals. Please review the following plan we discussed and let me know if I can assist you in the future.   Screening recommendations/referrals: Colonoscopy: declined Mammogram: declined Bone Density: declined Recommended yearly ophthalmology/optometry visit for glaucoma screening and checkup Recommended yearly dental visit for hygiene and checkup  Vaccinations: Influenza vaccine: 01/01/2021 Pneumococcal vaccine: 10/26/2012 Tdap vaccine: due Shingles vaccine: 06/10/2016; need second dose  Covid-19: 11/20/2019, 12/11/2019  Advanced directives: No  Conditions/risks identified: Yes  Next appointment: Please schedule your next Medicare Wellness Visit with your Nurse Health Advisor in 1 year by calling [phone].   Preventive Care 2 Years and Older, Female Preventive care refers to lifestyle choices and visits with your health care provider that can promote health and wellness. What does preventive care include? A yearly physical exam. This is also called an annual well check. Dental exams once or twice a year. Routine eye exams. Ask your health care provider how often you should have your eyes checked. Personal lifestyle choices, including: Daily care of your teeth and gums. Regular physical activity. Eating a healthy diet. Avoiding tobacco and drug use. Limiting alcohol use. Practicing safe sex. Taking low-dose aspirin every day. Taking vitamin and mineral supplements as recommended by your health care provider. What happens during an annual well check? The services and screenings done by your health care provider during your annual well check will depend on your age, overall health, lifestyle risk factors, and family history of disease. Counseling  Your health care provider may ask you questions about your: Alcohol use. Tobacco use. Drug  use. Emotional well-being. Home and relationship well-being. Sexual activity. Eating habits. History of falls. Memory and ability to understand (cognition). Work and work Statistician. Reproductive health. Screening  You may have the following tests or measurements: Height, weight, and BMI. Blood pressure. Lipid and cholesterol levels. These may be checked every 5 years, or more frequently if you are over 20 years old. Skin check. Lung cancer screening. You may have this screening every year starting at age 75 if you have a 30-pack-year history of smoking and currently smoke or have quit within the past 15 years. Fecal occult blood test (FOBT) of the stool. You may have this test every year starting at age 1. Flexible sigmoidoscopy or colonoscopy. You may have a sigmoidoscopy every 5 years or a colonoscopy every 10 years starting at age 33. Hepatitis C blood test. Hepatitis B blood test. Sexually transmitted disease (STD) testing. Diabetes screening. This is done by checking your blood sugar (glucose) after you have not eaten for a while (fasting). You may have this done every 1-3 years. Bone density scan. This is done to screen for osteoporosis. You may have this done starting at age 61. Mammogram. This may be done every 1-2 years. Talk to your health care provider about how often you should have regular mammograms. Talk with your health care provider about your test results, treatment options, and if necessary, the need for more tests. Vaccines  Your health care provider may recommend certain vaccines, such as: Influenza vaccine. This is recommended every year. Tetanus, diphtheria, and acellular pertussis (Tdap, Td) vaccine. You may need a Td booster every 10 years. Zoster vaccine. You may need this after age 1. Pneumococcal 13-valent conjugate (PCV13) vaccine. One dose is recommended after age 64. Pneumococcal polysaccharide (PPSV23) vaccine. One dose is recommended after age  21.  Talk to your health care provider about which screenings and vaccines you need and how often you need them. This information is not intended to replace advice given to you by your health care provider. Make sure you discuss any questions you have with your health care provider. Document Released: 03/08/2015 Document Revised: 10/30/2015 Document Reviewed: 12/11/2014 Elsevier Interactive Patient Education  2017 [employer] Prevention in the Home Falls can cause injuries. They can happen to people of all ages. There are many things you can do to make your home safe and to help prevent falls. What can I do on the outside of my home? Regularly fix the edges of walkways and driveways and fix any cracks. Remove anything that might make you trip as you walk through a door, such as a raised step or threshold. Trim any bushes or trees on the path to your home. Use bright outdoor lighting. Clear any walking paths of anything that might make someone trip, such as rocks or tools. Regularly check to see if handrails are loose or broken. Make sure that both sides of any steps have handrails. Any raised decks and porches should have guardrails on the edges. Have any leaves, snow, or ice cleared regularly. Use sand or salt on walking paths during winter. Clean up any spills in your garage right away. This includes oil or grease spills. What can I do in the bathroom? Use night lights. Install grab bars by the toilet and in the tub and shower. Do not use towel bars as grab bars. Use non-skid mats or decals in the tub or shower. If you need to sit down in the shower, use a plastic, non-slip stool. Keep the floor dry. Clean up any water that spills on the floor as soon as it happens. Remove soap buildup in the tub or shower regularly. Attach bath mats securely with double-sided non-slip rug tape. Do not have throw rugs and other things on the floor that can make you trip. What can I do in the  bedroom? Use night lights. Make sure that you have a light by your bed that is easy to reach. Do not use any sheets or blankets that are too big for your bed. They should not hang down onto the floor. Have a firm chair that has side arms. You can use this for support while you get dressed. Do not have throw rugs and other things on the floor that can make you trip. What can I do in the kitchen? Clean up any spills right away. Avoid walking on wet floors. Keep items that you use a lot in easy-to-reach places. If you need to reach something above you, use a strong step stool that has a grab bar. Keep electrical cords out of the way. Do not use floor polish or wax that makes floors slippery. If you must use wax, use non-skid floor wax. Do not have throw rugs and other things on the floor that can make you trip. What can I do with my stairs? Do not leave any items on the stairs. Make sure that there are handrails on both sides of the stairs and use them. Fix handrails that are broken or loose. Make sure that handrails are as long as the stairways. Check any carpeting to make sure that it is firmly attached to the stairs. Fix any carpet that is loose or worn. Avoid having throw rugs at the top or bottom of the stairs. If you do have throw rugs,  attach them to the floor with carpet tape. Make sure that you have a light switch at the top of the stairs and the bottom of the stairs. If you do not have them, ask someone to add them for you. What else can I do to help prevent falls? Wear shoes that: Do not have high heels. Have rubber bottoms. Are comfortable and fit you well. Are closed at the toe. Do not wear sandals. If you use a stepladder: Make sure that it is fully opened. Do not climb a closed stepladder. Make sure that both sides of the stepladder are locked into place. Ask someone to hold it for you, if possible. Clearly mark and make sure that you can see: Any grab bars or  handrails. First and last steps. Where the edge of each step is. Use tools that help you move around (mobility aids) if they are needed. These include: Canes. Walkers. Scooters. Crutches. Turn on the lights when you go into a dark area. Replace any light bulbs as soon as they burn out. Set up your furniture so you have a clear path. Avoid moving your furniture around. If any of your floors are uneven, fix them. If there are any pets around you, be aware of where they are. Review your medicines with your doctor. Some medicines can make you feel dizzy. This can increase your chance of falling. Ask your doctor what other things that you can do to help prevent falls. This information is not intended to replace advice given to you by your health care provider. Make sure you discuss any questions you have with your health care provider. Document Released: 12/06/2008 Document Revised: 07/18/2015 Document Reviewed: 03/16/2014 Elsevier Interactive Patient Education  2017 [employer].

## 2021-10-07 NOTE — Progress Notes (Addendum)
I connected with Shannon [name] today by telephone and verified that I am speaking with the correct person using two identifiers. Location patient: home Location provider: work Persons participating in the virtual visit: patient, provider.   I discussed the limitations, risks, security and privacy concerns of performing an evaluation and management service by telephone and the availability of in person appointments. I also discussed with the patient that there may be a patient responsible charge related to this service. The patient expressed understanding and verbally consented to this telephonic visit.    Interactive audio and video telecommunications were attempted between this provider and patient, however failed, due to patient having technical difficulties OR patient did not have access to video capability.  We continued and completed visit with audio only.  Some vital signs may be absent or patient reported.   Time Spent with patient on telephone encounter: 30 minutes  Subjective:   Shannon [name] is a 56 y.o. female who presents for Medicare Annual (Subsequent) preventive examination.  Review of Systems     Cardiac Risk Factors include: advanced age (>80mn, >>31women);family history of premature cardiovascular disease;hypertension;dyslipidemia;smoking/ tobacco exposure;sedentary lifestyle;obesity (BMI >30kg/m2)     Objective:    There were no vitals filed for this visit. There is no height or weight on file to calculate BMI.     10/07/2021   11:39 AM 06/10/2021   12:45 PM 12/22/2016    1:06 PM 08/17/2016   12:36 PM 08/12/2016    1:13 PM 04/09/2016    2:09 PM 11/05/2015    8:03 AM  Advanced Directives  Does Patient Have a Medical Advance Directive? No No No No No  No  Would patient like information on creating a medical advance directive? No - Patient declined No - Patient declined No - Patient declined No - Patient declined No - Patient declined  No - patient declined  information     Information is confidential and restricted. Go to Review Flowsheets to unlock data.    Current Medications (verified) Outpatient Encounter Medications as of 10/07/2021  Medication Sig   amLODipine (NORVASC) 5 MG tablet Take 1 tablet (5 mg total) by mouth daily.   ARIPiprazole (ABILIFY) 5 MG tablet Take 1 tablet (5 mg total) by mouth daily. (Patient not taking: Reported on 09/09/2021)   atorvastatin (LIPITOR) 20 MG tablet Take 1 tablet (20 mg total) by mouth daily.   clonazePAM (KLONOPIN) 0.5 MG tablet Take 1 tablet (0.5 mg total) by mouth 3 (three) times daily as needed for anxiety.   cyclobenzaprine (FLEXERIL) 10 MG tablet Take 1 tablet (10 mg total) by mouth at bedtime as needed for muscle spasms.   FLUoxetine (PROZAC) 20 MG capsule Take 1 capsule (20 mg total) by mouth daily.   meloxicam (MOBIC) 15 MG tablet Take 1 tablet (15 mg total) by mouth daily as needed for pain.   pantoprazole (PROTONIX) 40 MG tablet Take 1 tablet (40 mg total) by mouth 2 (two) times daily before a meal.   potassium chloride SA (KLOR-CON M) 20 MEQ tablet Take 2 tablets (40 mEq total) by mouth 2 (two) times daily for 7 days.   QUEtiapine (SEROQUEL) 25 MG tablet Take 1 tablet (25 mg total) by mouth 2 (two) times daily.   QUEtiapine (SEROQUEL) 300 MG tablet Take 1 tablet (300 mg total) by mouth at bedtime.   tiZANidine (ZANAFLEX) 4 MG tablet Take 1 tablet (4 mg total) by mouth at bedtime.   No facility-administered encounter medications on file  as of 10/07/2021.    Allergies (verified) Lamictal [lamotrigine]   History: Past Medical History:  Diagnosis Date   Allergic rhinitis    Allergic rhinitis 01/25/2009        ANEMIA-IRON DEFICIENCY 01/25/2009   Anxiety    Backache 01/25/2009   Qualifier: Diagnosis of  By: Jenny Reichmann MD, Hunt Oris    Bipolar 1 disorder [facility])    Chronic pain syndrome    Complete rotator cuff tear 07/21/2013   Complete tear of right rotator cuff 07/21/2013   Contact lens/glasses  fitting    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   DISC DISEASE, LUMBAR 01/25/2009   Essential hypertension 99/09/3380   On Bystolic    GAD (generalized anxiety disorder) 12/28/2013   GERD (gastroesophageal reflux disease)    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   Headache, acute 12/20/2013   Hx of laparoscopic gastric banding 09/03/2010   Surgery date: 07/17/10    HYPERLIPIDEMIA 01/25/2009   Hyperlipidemia with target LDL less than 130 01/25/2009   HYPERTENSION 01/25/2009   Hypokalemia, inadequate intake 09/01/2011   Insomnia 04/04/2011   INSOMNIA-SLEEP DISORDER-UNSPEC 04/30/2009   Qualifier: Diagnosis of  By: Jenny Reichmann MD, Hunt Oris    Iron deficiency anemia 01/25/2009        Labyrinthitis 02/19/2014   Leiomyoma of uterus, unspecified 08/13/2008   Overview:  Leiomyoma Of The Uterus  10/1 IMO update   Localized edema 06/12/2015   MANIC DEPRESSIVE ILLNESS 01/25/2009   pt is unsue if this is her specifc dx   Mixed bipolar I disorder ([place]) 07/12/2012   Morbid obesity ([place]) 01/25/2009   Night sweats    Osteopenia determined by x-ray 06/13/2021   Other abnormal glucose 10/26/2012   Palpitations 10/25/2013   PEPTIC ULCER DISEASE, HELICOBACTER PYLORI POSITIVE 10/03/2009   Peripheral edema 06/25/2015   Pernicious anemia 03/28/2012   Piriformis syndrome of both sides 09/02/2018   PUD (peptic ulcer disease) 10/03/2009        Right shoulder pain 05/03/2013   Dg Shoulder Right  05/03/2013   CLINICAL DATA Pain.  EXAM RIGHT SHOULDER - 2+ VIEW  COMPARISON None.  FINDINGS Acromioclavicular and glenohumeral degenerative change present. Questionable calcific density noted in the region of the supraspinatus space, possibly representing calcific supraspinatus tendinitis. This could represent a sclerotic density in the acromion. MRI of the right shoulder suggest   Sleep apnea 08/12/2016   SMOKER 01/25/2009   Qualifier: Diagnosis of  By: Jenny Reichmann MD, Hunt Oris    Subacromial bursitis 05/17/2013   SUBSTANCE ABUSE 11/06/2009         Thiamin deficiency 10/30/2012   Tobacco abuse 06/09/2010   Visual disturbance 05/03/2013   Vitamin D deficiency 10/27/2012   Past Surgical History:  Procedure Laterality Date   ABDOMINAL HYSTERECTOMY     BLADDER SURGERY     s/p with ?diverticulitis   HAND TENDON SURGERY  1991   s/p-Right-index and middle   LAPAROSCOPIC GASTRIC BAND REMOVAL WITH LAPAROSCOPIC GASTRIC SLEEVE RESECTION N/A 08/17/2016   Procedure: LAPAROSCOPIC GASTRIC BAND REMOVAL WITH LAPAROSCOPIC GASTRIC SLEEVE RESECTION WITH UPPER ENDO;  Surgeon: Alphonsa Overall, MD;  Location: [facility];  Service: General;  Laterality: N/A;   LAPAROSCOPIC GASTRIC BANDING  07/14/10   SHOULDER ARTHROSCOPY Right 07/21/2013   Procedure: RIGHT ARTHROSCOPY SHOULDER DEBRIDMENT EXTENTSIVE,ARTHROSCOPIC REMOVE LOOSE FOREIGN BODY, BICEPS TENOLYSIS ;  Surgeon: Johnny Bridge, MD;  Location: [facility];  Service: Orthopedics;  Laterality: Right;   TUBAL LIGATION     Family History  Problem Relation Age  of Onset   Hypertension Mother    Asthma Mother    Stroke Father    Cirrhosis Father        ETOH   Hypertension Father    Diabetes Father    Alcohol abuse Father    Hypertension Sister    Asthma Sister    Hypertension Brother    ADD / ADHD Son    Hypertension Paternal Aunt    Diabetes Maternal Grandmother    ADD / ADHD Other        3 nephews, also emotional issues   Diabetes Other        Uncle   Diabetes Other        Aunt   Suicidality Neg Hx    Social History   Socioeconomic History   Marital status: Married    Spouse name: Not on file   Number of children: 2   Years of education: Not on file   Highest education level: Not on file  Occupational History   Occupation: STUDENT    Employer: UNEMPLOYED  Tobacco Use   Smoking status: Every Day    Packs/day: 0.25    Years: 20.00    Total pack years: 5.00    Types: Cigarettes   Smokeless tobacco: Never   Tobacco comments:    States has cut back to 5-7 a day and working on  this.   Vaping Use   Vaping Use: Never used  Substance and Sexual Activity   Alcohol use: No    Alcohol/week: 0.0 standard drinks of alcohol   Drug use: No   Sexual activity: Not Currently  Other Topics Concern   Not on file  Social History Narrative   Moved from [place], [place] to [place] 2009   2 children-1 boy, 1 girl   Disabled-bipolar   Daily Caffeine Use-1 cup/day   Social Determinants of Health   Financial Resource Strain: High Risk (10/07/2021)   Overall Financial Resource Strain (CARDIA)    Difficulty of Paying Living Expenses: Hard  Food Insecurity: Food Insecurity Present (10/07/2021)   Hunger Vital Sign    Worried About Running Out of Food in the Last Year: Often true    Ran Out of Food in the Last Year: Often true  Transportation Needs: No Transportation Needs (10/07/2021)   PRAPARE - Hydrologist (Medical): No    Lack of Transportation (Non-Medical): No  Physical Activity: Inactive (10/07/2021)   Exercise Vital Sign    Days of Exercise per Week: 0 days    Minutes of Exercise per Session: 0 min  Stress: No Stress Concern Present (10/07/2021)   [place]    Feeling of Stress : Not at all  Recent Concern: Stress - Stress Concern Present (10/07/2021)   [place]    Feeling of Stress : Very much  Social Connections: Moderately Isolated (10/07/2021)   Social Connection and Isolation Panel [NHANES]    Frequency of Communication with Friends and Family: Three times a week    Frequency of Social Gatherings with Friends and Family: Never    Attends Religious Services: Never    Marine scientist or Organizations: No    Attends Music therapist: Never    Marital Status: Married    Tobacco Counseling Ready to quit: Not Answered Counseling given: Not Answered Tobacco comments: States has  cut back to 5-7 a day  and working on this.    Clinical Intake:  Pre-visit preparation completed: Yes  Pain : No/denies pain     Nutritional Risks: None Diabetes: No  How often do you need to have someone help you when you read instructions, pamphlets, or other written materials from your doctor or pharmacy?: 1 - Never What is the last grade level you completed in school?: HSG  Diabetic? no  Interpreter Needed?: No  Information entered by :: Lisette Abu, LPN.   Activities of Daily Living    10/07/2021   11:53 AM  In your present state of health, do you have any difficulty performing the following activities:  Hearing? 0  Vision? 0  Difficulty concentrating or making decisions? 1  Walking or climbing stairs? 0  Dressing or bathing? 1  Doing errands, shopping? 1  Preparing Food and eating ? Y  Using the Toilet? N  In the past six months, have you accidently leaked urine? N  Do you have problems with loss of bowel control? N  Managing your Medications? Y  Managing your Finances? Y  Housekeeping or managing your Housekeeping? Y    Patient Care Team: Girtha Rm, NP-C as PCP - General (Family Medicine) Sueanne Margarita, MD as PCP - Cardiology (Cardiology) Cheri Fowler, MD as Consulting Physician (Obstetrics and Gynecology) Kathlee Nations, MD as Consulting Physician (Psychiatry)  Indicate any recent Medical Services you may have received from other than [facility] providers in the past year (date may be approximate).     Assessment:   This is a routine wellness examination for Apoorva.  Hearing/Vision screen Hearing Screening - Comments:: Patient denied any hearing difficulty.   No hearing aids.  Vision Screening - Comments:: Patient does not wear any corrective lenses/contacts.    Dietary issues and exercise activities discussed: Current Exercise Habits: The patient does not participate in regular exercise at present, Exercise limited by: psychological  condition(s);orthopedic condition(s)   Goals Addressed             This Visit's Progress    Patient declined health goal at this time.        Depression Screen    10/07/2021   11:49 AM 05/14/2021    1:08 PM 08/28/2020    1:46 PM 03/30/2016   11:38 AM 02/27/2016   11:02 AM 01/27/2016   10:59 AM 12/25/2015   10:59 AM  PHQ 2/9 Scores  PHQ - 2 Score 6 6  0 0 0 0  PHQ- 9 Score 25 25          Information is confidential and restricted. Go to Review Flowsheets to unlock data.    Fall Risk    10/07/2021   11:53 AM 05/14/2021    1:09 PM 03/30/2016   11:38 AM 02/27/2016   11:02 AM 01/27/2016   10:59 AM  Fall Risk   Falls in the past year? 1 1 No No No  Number falls in past yr: 0 0     Injury with Fall? 1 1     Follow up Falls evaluation completed        FALL RISK PREVENTION PERTAINING TO THE HOME:  Any stairs in or around the home? Yes  If so, are there any without handrails? No  Home free of loose throw rugs in walkways, pet beds, electrical cords, etc? Yes  Adequate lighting in your home to reduce risk of falls? Yes   ASSISTIVE DEVICES UTILIZED TO PREVENT FALLS:  Life alert? No  Use  of a cane, walker or w/c? No  Grab bars in the bathroom?  No Shower chair or bench in shower? No  Elevated toilet seat or a handicapped toilet? No   TIMED UP AND GO:  Was the test performed? No .  Length of time to ambulate 10 feet: n/a sec.   Appearance of gait: Gait not evaluated during this visit.  Cognitive Function:    10/07/2021   11:54 AM  MMSE - Mini Mental State Exam  Not completed: Unable to complete        10/10/2021   10:31 AM  6CIT Screen  What Year? 0 points  What month? 0 points  What time? 0 points  Count back from 20 0 points  Months in reverse 0 points  Repeat phrase 0 points  Total Score 0 points    Immunizations Immunization History  Administered Date(s) Administered   Influenza Whole 02/27/2009   Influenza,inj,Quad PF,6+ Mos 10/26/2012, 10/25/2013,  12/11/2014, 12/21/2016, 11/25/2017, 11/08/2018   Influenza-Unspecified 01/01/2021   PFIZER(Purple Top)SARS-COV-2 Vaccination 11/20/2019, 12/11/2019   PPD Test 12/30/2020   Pneumococcal Polysaccharide-23 10/26/2012   Td 02/27/2009   Zoster Recombinat (Shingrix) 06/10/2016    TDAP status: Due, Education has been provided regarding the importance of this vaccine. Advised may receive this vaccine at local pharmacy or Health Dept. Aware to provide a copy of the vaccination record if obtained from local pharmacy or Health Dept. Verbalized acceptance and understanding.  Flu Vaccine status: Up to date  Pneumococcal vaccine status: Up to date  Covid-19 vaccine status: Completed vaccines  Qualifies for Shingles Vaccine? Yes   Zostavax completed No   Shingrix Completed?: No.    Education has been provided regarding the importance of this vaccine. Patient has been advised to call insurance company to determine out of pocket expense if they have not yet received this vaccine. Advised may also receive vaccine at local pharmacy or Health Dept. Verbalized acceptance and understanding.  Screening Tests Health Maintenance  Topic Date Due   HIV Screening  Never done   MAMMOGRAM  06/23/2014   Zoster Vaccines- Shingrix (2 of 2) 08/05/2016   TETANUS/TDAP  02/28/2019   COVID-19 Vaccine (3 - Pfizer risk series) 01/08/2020   Fecal DNA (Cologuard)  01/10/2020   INFLUENZA VACCINE  09/23/2021   Hepatitis C Screening  Completed   HPV VACCINES  Aged Out    Health Maintenance  Health Maintenance Due  Topic Date Due   HIV Screening  Never done   MAMMOGRAM  06/23/2014   Zoster Vaccines- Shingrix (2 of 2) 08/05/2016   TETANUS/TDAP  02/28/2019   COVID-19 Vaccine (3 - Pfizer risk series) 01/08/2020   Fecal DNA (Cologuard)  01/10/2020   INFLUENZA VACCINE  09/23/2021    Colorectal screening: Declined  Mammogram status: Declined  Bone density status: Declined  Lung Cancer Screening: (Low Dose CT Chest  recommended if Age 48-80 years, 30 pack-year currently smoking OR have quit w/in 15years.) does qualify.   Lung Cancer Screening Referral: No; patient declined  Additional Screening:  Hepatitis C Screening: yes qualify; Completed 03/01/2009  Vision Screening: Recommended annual ophthalmology exams for early detection of glaucoma and other disorders of the eye. Is the patient up to date with their annual eye exam?  No  Who is the provider or what is the name of the office in which the patient attends annual eye exams? Patient does not have an eye doctor If pt is not established with a provider, would they like to be  referred to a provider to establish care? Yes .   Dental Screening: Recommended annual dental exams for proper oral hygiene  Community Resource Referral / Chronic Care Management: CRR required this visit?  Yes   CCM required this visit?  Yes      Plan:     I have personally reviewed and noted the following in the patient's chart:   Medical and social history Use of alcohol, tobacco or illicit drugs  Current medications and supplements including opioid prescriptions.  Functional ability and status Nutritional status Physical activity Advanced directives List of other physicians Hospitalizations, surgeries, and ER visits in previous 12 months Vitals Screenings to include cognitive, depression, and falls Referrals and appointments  In addition, I have reviewed and discussed with patient certain preventive protocols, quality metrics, and best practice recommendations. A written personalized care plan for preventive services as well as general preventive health recommendations were provided to patient.     Sheral Flow, LPN   9/67/5916   Nurse Notes:  Patient is Muslim There were no vitals filed for this visit. There is no height or weight on file to calculate BMI.   Following referrals needed: Theatre manager [facility]  Barriers Barriers to Care Management &Learning Education officer, museum Consult Care Management Consult

## 2021-10-20 DIAGNOSIS — Z23 Encounter for immunization: Secondary | ICD-10-CM | POA: Diagnosis not present

## 2021-10-22 DIAGNOSIS — Z681 Body mass index (BMI) 19 or less, adult: Secondary | ICD-10-CM | POA: Diagnosis not present

## 2021-11-10 ENCOUNTER — Other Ambulatory Visit (HOSPITAL_COMMUNITY): Payer: Self-pay | Admitting: Psychiatry

## 2021-11-10 DIAGNOSIS — F4310 Post-traumatic stress disorder, unspecified: Secondary | ICD-10-CM

## 2021-11-11 ENCOUNTER — Other Ambulatory Visit (HOSPITAL_COMMUNITY): Payer: Self-pay | Admitting: Psychiatry

## 2021-11-11 DIAGNOSIS — F4310 Post-traumatic stress disorder, unspecified: Secondary | ICD-10-CM

## 2021-11-12 ENCOUNTER — Other Ambulatory Visit (HOSPITAL_COMMUNITY): Payer: Self-pay

## 2021-11-12 DIAGNOSIS — F4310 Post-traumatic stress disorder, unspecified: Secondary | ICD-10-CM

## 2021-11-12 MED ORDER — FLUOXETINE HCL 20 MG PO CAPS: 20.0000 mg | ORAL_CAPSULE | Freq: Every day | ORAL | 0 refills | Status: DC

## 2021-11-18 ENCOUNTER — Encounter (HOSPITAL_COMMUNITY): Payer: Self-pay | Admitting: Psychiatry

## 2021-11-18 ENCOUNTER — Telehealth (HOSPITAL_BASED_OUTPATIENT_CLINIC_OR_DEPARTMENT_OTHER): Payer: Medicare Other | Admitting: Psychiatry

## 2021-11-18 VITALS — Wt 282.0 lb

## 2021-11-18 DIAGNOSIS — F609 Personality disorder, unspecified: Secondary | ICD-10-CM | POA: Diagnosis not present

## 2021-11-18 DIAGNOSIS — F4310 Post-traumatic stress disorder, unspecified: Secondary | ICD-10-CM

## 2021-11-18 DIAGNOSIS — F319 Bipolar disorder, unspecified: Secondary | ICD-10-CM | POA: Diagnosis not present

## 2021-11-18 MED ORDER — CLONAZEPAM 0.5 MG PO TABS: 0.5000 mg | ORAL_TABLET | Freq: Three times a day (TID) | ORAL | 2 refills | Status: DC | PRN

## 2021-11-18 MED ORDER — QUETIAPINE FUMARATE 300 MG PO TABS: 300.0000 mg | ORAL_TABLET | Freq: Every day | ORAL | 2 refills | Status: DC

## 2021-11-18 MED ORDER — QUETIAPINE FUMARATE 25 MG PO TABS: 25.0000 mg | ORAL_TABLET | Freq: Two times a day (BID) | ORAL | 2 refills | Status: DC

## 2021-11-18 MED ORDER — FLUOXETINE HCL 20 MG PO CAPS: 20.0000 mg | ORAL_CAPSULE | Freq: Every day | ORAL | 2 refills | Status: DC
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
## 2021-12-25 ENCOUNTER — Encounter (HOSPITAL_COMMUNITY): Payer: Self-pay

## 2021-12-25 ENCOUNTER — Encounter: Payer: Self-pay | Admitting: Family Medicine

## 2021-12-25 ENCOUNTER — Emergency Department (HOSPITAL_COMMUNITY): Payer: Medicare Other

## 2021-12-25 ENCOUNTER — Other Ambulatory Visit: Payer: Self-pay

## 2021-12-25 ENCOUNTER — Emergency Department (HOSPITAL_COMMUNITY)
Admission: EM | Admit: 2021-12-25 | Discharge: 2021-12-25 | Disposition: A | Discharge status: Home / Self Care | Payer: Medicare Other | Attending: Emergency Medicine | Admitting: Emergency Medicine

## 2021-12-25 ENCOUNTER — Ambulatory Visit (INDEPENDENT_AMBULATORY_CARE_PROVIDER_SITE_OTHER): Payer: Medicare Other | Admitting: Family Medicine

## 2021-12-25 VITALS — HR 117

## 2021-12-25 DIAGNOSIS — R202 Paresthesia of skin: Secondary | ICD-10-CM | POA: Insufficient documentation

## 2021-12-25 DIAGNOSIS — Z9889 Other specified postprocedural states: Secondary | ICD-10-CM | POA: Diagnosis not present

## 2021-12-25 DIAGNOSIS — I951 Orthostatic hypotension: Secondary | ICD-10-CM | POA: Diagnosis not present

## 2021-12-25 DIAGNOSIS — R519 Headache, unspecified: Secondary | ICD-10-CM

## 2021-12-25 DIAGNOSIS — R188 Other ascites: Secondary | ICD-10-CM | POA: Diagnosis not present

## 2021-12-25 DIAGNOSIS — R059 Cough, unspecified: Secondary | ICD-10-CM | POA: Diagnosis not present

## 2021-12-25 DIAGNOSIS — R42 Dizziness and giddiness: Secondary | ICD-10-CM | POA: Diagnosis not present

## 2021-12-25 DIAGNOSIS — R000 Tachycardia, unspecified: Secondary | ICD-10-CM | POA: Diagnosis not present

## 2021-12-25 DIAGNOSIS — R5383 Other fatigue: Secondary | ICD-10-CM | POA: Diagnosis not present

## 2021-12-25 DIAGNOSIS — R1084 Generalized abdominal pain: Secondary | ICD-10-CM | POA: Diagnosis not present

## 2021-12-25 DIAGNOSIS — K469 Unspecified abdominal hernia without obstruction or gangrene: Secondary | ICD-10-CM | POA: Diagnosis not present

## 2021-12-25 DIAGNOSIS — E876 Hypokalemia: Secondary | ICD-10-CM | POA: Diagnosis not present

## 2021-12-25 DIAGNOSIS — R200 Anesthesia of skin: Secondary | ICD-10-CM | POA: Diagnosis not present

## 2021-12-25 DIAGNOSIS — R1010 Upper abdominal pain, unspecified: Secondary | ICD-10-CM

## 2021-12-25 DIAGNOSIS — R109 Unspecified abdominal pain: Secondary | ICD-10-CM | POA: Insufficient documentation

## 2021-12-25 DIAGNOSIS — R262 Difficulty in walking, not elsewhere classified: Secondary | ICD-10-CM | POA: Diagnosis not present

## 2021-12-25 DIAGNOSIS — H538 Other visual disturbances: Secondary | ICD-10-CM | POA: Diagnosis not present

## 2021-12-25 LAB — COMPREHENSIVE METABOLIC PANEL
ALT: 15 U/L (ref 0–44)
AST: 20 U/L (ref 15–41)
Albumin: 3.1 g/dL — ABNORMAL LOW (ref 3.5–5.0)
Alkaline Phosphatase: 107 U/L (ref 38–126)
Anion gap: 9 (ref 5–15)
BUN: 6 mg/dL (ref 6–20)
CO2: 25 mmol/L (ref 22–32)
Calcium: 8.2 mg/dL — ABNORMAL LOW (ref 8.9–10.3)
Chloride: 102 mmol/L (ref 98–111)
Creatinine, Ser: 1.07 mg/dL — ABNORMAL HIGH (ref 0.44–1.00)
GFR, Estimated: >60 mL/min (ref >60)
Glucose, Bld: 117 mg/dL — ABNORMAL HIGH (ref 70–99)
Potassium: 2.5 mmol/L — CL (ref 3.5–5.1)
Sodium: 136 mmol/L (ref 135–145)
Total Bilirubin: 0.5 mg/dL (ref 0.3–1.2)
Total Protein: 8 g/dL (ref 6.5–8.1)

## 2021-12-25 LAB — CBC
HCT: 42.6 % (ref 36.0–46.0)
Hemoglobin: 13.4 g/dL (ref 12.0–15.0)
MCH: 24 pg — ABNORMAL LOW (ref 26.0–34.0)
MCHC: 31.5 g/dL (ref 30.0–36.0)
MCV: 76.2 fL — ABNORMAL LOW (ref 80.0–100.0)
Platelets: 300 10*3/uL (ref 150–400)
RBC: 5.59 MIL/uL — ABNORMAL HIGH (ref 3.87–5.11)
RDW: 18.5 % — ABNORMAL HIGH (ref 11.5–15.5)
WBC: 6.5 10*3/uL (ref 4.0–10.5)
nRBC: 0 % (ref 0.0–0.2)

## 2021-12-25 LAB — MAGNESIUM: Magnesium: 1.9 mg/dL (ref 1.7–2.4)

## 2021-12-25 LAB — POCT GLUCOSE (DEVICE FOR HOME USE): POC Glucose: 107 mg/dl — AB (ref 70–99)

## 2021-12-25 LAB — PHOSPHORUS: Phosphorus: 3 mg/dL (ref 2.5–4.6)

## 2021-12-25 LAB — FOLATE: Folate: 5.5 ng/mL — ABNORMAL LOW (ref >5.9)

## 2021-12-25 LAB — CBG MONITORING, ED: Glucose-Capillary: 103 mg/dL — ABNORMAL HIGH (ref 70–99)

## 2021-12-25 MED ORDER — POTASSIUM CHLORIDE CRYS ER 20 MEQ PO TBCR
40.0000 meq | EXTENDED_RELEASE_TABLET | Freq: Once | ORAL | Status: AC
  Administered 2021-12-25: 40 meq via ORAL
  Filled 2021-12-25: qty 2

## 2021-12-25 MED ORDER — MORPHINE SULFATE (PF) 4 MG/ML IV SOLN
4.0000 mg | Freq: Once | INTRAVENOUS | Status: AC
  Administered 2021-12-25: 4 mg via INTRAVENOUS
  Filled 2021-12-25: qty 1

## 2021-12-25 MED ORDER — POTASSIUM CHLORIDE CRYS ER 20 MEQ PO TBCR: 20.0000 meq | EXTENDED_RELEASE_TABLET | Freq: Two times a day (BID) | ORAL | 0 refills | Status: DC

## 2021-12-25 MED ORDER — LACTATED RINGERS IV BOLUS
1000.0000 mL | Freq: Once | INTRAVENOUS | Status: AC
  Administered 2021-12-25: 1000 mL via INTRAVENOUS

## 2021-12-25 MED ORDER — OXYCODONE HCL 5 MG PO TABS: 2.5000 mg | ORAL_TABLET | ORAL | 0 refills | Status: AC | PRN

## 2021-12-25 MED ORDER — FAMOTIDINE 20 MG PO TABS
20.0000 mg | ORAL_TABLET | Freq: Once | ORAL | Status: AC
  Administered 2021-12-25: 20 mg via ORAL
  Filled 2021-12-25: qty 1

## 2021-12-25 MED ORDER — OXYCODONE HCL 5 MG PO TABS
5.0000 mg | ORAL_TABLET | Freq: Once | ORAL | Status: AC
  Administered 2021-12-25: 5 mg via ORAL
  Filled 2021-12-25: qty 1

## 2021-12-25 MED ORDER — IOHEXOL 300 MG/ML  SOLN
100.0000 mL | Freq: Once | INTRAMUSCULAR | Status: AC | PRN
  Administered 2021-12-25: 100 mL via INTRAVENOUS

## 2021-12-25 NOTE — Discharge Instructions (Addendum)
You were seen today for tingling in your arms, toes.  Weakness over the past 2 days, dehydration, fatigue.  Your labs are notable for low potassium likely from your ongoing decreased intake.  I recommended that you follow-up with your primary care provider.  We have given you IV fluids, I prescribed you potassium.  We have improved your pain tonight from your collapsing episode and with your bruising, I prescribed you a short course of pain medication to assist with tolerating your symptoms.

## 2021-12-25 NOTE — Progress Notes (Signed)
Subjective:     Patient ID: [name], female    DOB: [birth year], 56 y.o.   MRN: [id number]  Chief Complaint  Patient presents with   Dizziness   Blurred Vision    Has been going on for 2 weeks    Dizziness   Patient is in today for headache, blurred vision, dizziness, unable to walk due to legs giving away. She fell last night. Denies loc.  She had palpitations last night when she fell.  Symptoms started 1-2 days ago.  She is normally ambulatory.   Attempted to walk into the exam room but had to sit due to dizziness. She was then placed in a wheelchair.   She also c/o a tender (lump) in her upper abdomen.    Health Maintenance Due  Topic Date Due   HIV Screening  Never done   MAMMOGRAM  06/23/2014   Zoster Vaccines- Shingrix (2 of 2) 08/05/2016   TETANUS/TDAP  02/28/2019   COVID-19 Vaccine (3 - Pfizer risk series) 01/08/2020   Fecal DNA (Cologuard)  01/10/2020   INFLUENZA VACCINE  09/23/2021    Past Medical History:  Diagnosis Date   Allergic rhinitis    Allergic rhinitis 01/25/2009        ANEMIA-IRON DEFICIENCY 01/25/2009   Anxiety    Backache 01/25/2009   Qualifier: Diagnosis of  By: Jenny Reichmann MD, Hunt Oris    Bipolar 1 disorder [facility])    Chronic pain syndrome    Complete rotator cuff tear 07/21/2013   Complete tear of right rotator cuff 07/21/2013   Contact lens/glasses fitting    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   DISC DISEASE, LUMBAR 01/25/2009   Essential hypertension 54/07/2701   On Bystolic    GAD (generalized anxiety disorder) 12/28/2013   GERD (gastroesophageal reflux disease)    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   Headache, acute 12/20/2013   Hx of laparoscopic gastric banding 09/03/2010   Surgery date: 07/17/10    HYPERLIPIDEMIA 01/25/2009   Hyperlipidemia with target LDL less than 130 01/25/2009   HYPERTENSION 01/25/2009   Hypokalemia, inadequate intake 09/01/2011   Insomnia 04/04/2011   INSOMNIA-SLEEP DISORDER-UNSPEC 04/30/2009    Qualifier: Diagnosis of  By: Jenny Reichmann MD, Hunt Oris    Iron deficiency anemia 01/25/2009        Labyrinthitis 02/19/2014   Leiomyoma of uterus, unspecified 08/13/2008   Overview:  Leiomyoma Of The Uterus  10/1 IMO update   Localized edema 06/12/2015   MANIC DEPRESSIVE ILLNESS 01/25/2009   pt is unsue if this is her specifc dx   Mixed bipolar I disorder ([place]) 07/12/2012   Morbid obesity ([place]) 01/25/2009   Night sweats    Osteopenia determined by x-ray 06/13/2021   Other abnormal glucose 10/26/2012   Palpitations 10/25/2013   PEPTIC ULCER DISEASE, HELICOBACTER PYLORI POSITIVE 10/03/2009   Peripheral edema 06/25/2015   Pernicious anemia 03/28/2012   Piriformis syndrome of both sides 09/02/2018   PUD (peptic ulcer disease) 10/03/2009        Right shoulder pain 05/03/2013   Dg Shoulder Right  05/03/2013   CLINICAL DATA Pain.  EXAM RIGHT SHOULDER - 2+ VIEW  COMPARISON None.  FINDINGS Acromioclavicular and glenohumeral degenerative change present. Questionable calcific density noted in the region of the supraspinatus space, possibly representing calcific supraspinatus tendinitis. This could represent a sclerotic density in the acromion. MRI of the right shoulder suggest   Sleep apnea 08/12/2016   SMOKER 01/25/2009   Qualifier: Diagnosis of  By: Jenny Reichmann MD, Jeneen Rinks  W    Subacromial bursitis 05/17/2013   SUBSTANCE ABUSE 11/06/2009        Thiamin deficiency 10/30/2012   Tobacco abuse 06/09/2010   Visual disturbance 05/03/2013   Vitamin D deficiency 10/27/2012    Past Surgical History:  Procedure Laterality Date   ABDOMINAL HYSTERECTOMY     BLADDER SURGERY     s/p with ?diverticulitis   HAND TENDON SURGERY  1991   s/p-Right-index and middle   LAPAROSCOPIC GASTRIC BAND REMOVAL WITH LAPAROSCOPIC GASTRIC SLEEVE RESECTION N/A 08/17/2016   Procedure: LAPAROSCOPIC GASTRIC BAND REMOVAL WITH LAPAROSCOPIC GASTRIC SLEEVE RESECTION WITH UPPER ENDO;  Surgeon: Alphonsa Overall, MD;  Location: [facility];  Service: General;  Laterality: N/A;    LAPAROSCOPIC GASTRIC BANDING  07/14/10   SHOULDER ARTHROSCOPY Right 07/21/2013   Procedure: RIGHT ARTHROSCOPY SHOULDER DEBRIDMENT EXTENTSIVE,ARTHROSCOPIC REMOVE LOOSE FOREIGN BODY, BICEPS TENOLYSIS ;  Surgeon: Johnny Bridge, MD;  Location: [name];  Service: Orthopedics;  Laterality: Right;   TUBAL LIGATION      Family History  Problem Relation Age of Onset   Hypertension Mother    Asthma Mother    Stroke Father    Cirrhosis Father        ETOH   Hypertension Father    Diabetes Father    Alcohol abuse Father    Hypertension Sister    Asthma Sister    Hypertension Brother    ADD / ADHD Son    Hypertension Paternal Aunt    Diabetes Maternal Grandmother    ADD / ADHD Other        3 nephews, also emotional issues   Diabetes Other        Uncle   Diabetes Other        Aunt   Suicidality Neg Hx     Social History   Socioeconomic History   Marital status: Married    Spouse name: Not on file   Number of children: 2   Years of education: Not on file   Highest education level: Not on file  Occupational History   Occupation: STUDENT    Employer: UNEMPLOYED  Tobacco Use   Smoking status: Every Day    Packs/day: 0.25    Years: 20.00    Total pack years: 5.00    Types: Cigarettes   Smokeless tobacco: Never   Tobacco comments:    States has cut back to 5-7 a day and working on this.   Vaping Use   Vaping Use: Never used  Substance and Sexual Activity   Alcohol use: No    Alcohol/week: 0.0 standard drinks of alcohol   Drug use: No   Sexual activity: Not Currently  Other Topics Concern   Not on file  Social History Narrative   Moved from [place] to [place] 2009   2 children-1 boy, 1 girl   Disabled-bipolar   Daily Caffeine Use-1 cup/day   Social Determinants of Health   Financial Resource Strain: High Risk (10/07/2021)   Overall Financial Resource Strain (CARDIA)    Difficulty of Paying Living Expenses: Hard  Food Insecurity: Food  Insecurity Present (10/07/2021)   Hunger Vital Sign    Worried About Running Out of Food in the Last Year: Often true    Ran Out of Food in the Last Year: Often true  Transportation Needs: No Transportation Needs (10/07/2021)   PRAPARE - Hydrologist (Medical): No    Lack of Transportation (Non-Medical): No  Physical Activity: Inactive (10/07/2021)   Exercise Vital Sign    Days of Exercise per Week: 0 days    Minutes of Exercise per Session: 0 min  Stress: No Stress Concern Present (10/07/2021)   [place]    Feeling of Stress : Not at all  Recent Concern: Stress - Stress Concern Present (10/07/2021)   [place]    Feeling of Stress : Very much  Social Connections: Moderately Isolated (10/07/2021)   Social Connection and Isolation Panel [NHANES]    Frequency of Communication with Friends and Family: Three times a week    Frequency of Social Gatherings with Friends and Family: Never    Attends Religious Services: Never    Marine scientist or Organizations: No    Attends Archivist Meetings: Never    Marital Status: Married  Human resources officer Violence: Not At Risk (10/07/2021)   Humiliation, Afraid, Rape, and Kick questionnaire    Fear of Current or Ex-Partner: No    Emotionally Abused: No    Physically Abused: No    Sexually Abused: No    Outpatient Medications Prior to Visit  Medication Sig Dispense Refill   amLODipine (NORVASC) 5 MG tablet Take 1 tablet (5 mg total) by mouth daily. 30 tablet 2   ARIPiprazole (ABILIFY) 5 MG tablet Take 1 tablet (5 mg total) by mouth daily. (Patient not taking: Reported on 09/09/2021) 30 tablet 1   atorvastatin (LIPITOR) 20 MG tablet Take 1 tablet (20 mg total) by mouth daily. 90 tablet 1   clonazePAM (KLONOPIN) 0.5 MG tablet Take 1 tablet (0.5 mg total) by mouth 3 (three) times  daily as needed for anxiety. 75 tablet 2   cyclobenzaprine (FLEXERIL) 10 MG tablet Take 1 tablet (10 mg total) by mouth at bedtime as needed for muscle spasms. 15 tablet 0   FLUoxetine (PROZAC) 20 MG capsule Take 1 capsule (20 mg total) by mouth daily. 30 capsule 2   meloxicam (MOBIC) 15 MG tablet Take 1 tablet (15 mg total) by mouth daily as needed for pain. 14 tablet 0   pantoprazole (PROTONIX) 40 MG tablet Take 1 tablet (40 mg total) by mouth 2 (two) times daily before a meal. 60 tablet 2   potassium chloride SA (KLOR-CON M) 20 MEQ tablet Take 2 tablets (40 mEq total) by mouth 2 (two) times daily for 7 days. 28 tablet 0   QUEtiapine (SEROQUEL) 25 MG tablet Take 1 tablet (25 mg total) by mouth 2 (two) times daily. 60 tablet 2   QUEtiapine (SEROQUEL) 300 MG tablet Take 1 tablet (300 mg total) by mouth at bedtime. 30 tablet 2   tiZANidine (ZANAFLEX) 4 MG tablet Take 1 tablet (4 mg total) by mouth at bedtime. 14 tablet 0   No facility-administered medications prior to visit.    Allergies  Allergen Reactions   Lamictal [Lamotrigine] Rash    Review of Systems  Neurological:  Positive for dizziness.       Objective:    Physical Exam Constitutional:      General: She is in acute distress.     Appearance: She is obese. She is ill-appearing.  Cardiovascular:     Rate and Rhythm: Tachycardia present.  Pulmonary:     Effort: Pulmonary effort is normal.  Abdominal:     Tenderness: There is abdominal tenderness in the epigastric area.  Skin:    General: Skin is warm and  dry.  Neurological:     Mental Status: She is alert and oriented to person, place, and time.     Cranial Nerves: No facial asymmetry.     Coordination: Coordination abnormal.     Gait: Gait abnormal.     Comments: Unsteady gait. No focal weakness.      Pulse (!) 117   SpO2 94%  Wt Readings from Last 3 Encounters:  06/17/21 287 lb (130.2 kg)  06/10/21 287 lb (130.2 kg)  05/14/21 287 lb 12.8 oz (130.5 kg)        Assessment & Plan:   Problem List Items Addressed This Visit       Other   Blurred vision, bilateral - Primary   Relevant Orders   POCT Glucose (Device for Home Use) (Completed)   Dizziness   Relevant Orders   POCT Glucose (Device for Home Use) (Completed)   Pins and needles sensation   Relevant Orders   POCT Glucose (Device for Home Use) (Completed)   Severe headache   Relevant Orders   POCT Glucose (Device for Home Use) (Completed)   Unable to ambulate   Relevant Orders   POCT Glucose (Device for Home Use) (Completed)   Upper abdominal pain   Patient unable to ambulate to exam room due to dizziness and blurred vision. She was placed in a wheelchair.  No chest pain or shortness of breath. No focal weakness on exam.  POCT glucose [facility] called to take her to the ED for further evaluation due to complexity of complaints.  Her husband is here with her and aware.    I am having Shannon [name] maintain her amLODipine, atorvastatin, pantoprazole, potassium chloride SA, tiZANidine, meloxicam, cyclobenzaprine, ARIPiprazole, FLUoxetine, QUEtiapine, QUEtiapine, and clonazePAM.  No orders of the defined types were placed in this encounter.

## 2021-12-25 NOTE — ED Notes (Signed)
Pt not in room at this time. Medications handed over to [place], Therapist, sports.

## 2021-12-25 NOTE — ED Provider Notes (Signed)
[facility] DEPT Provider Note   CSN: [id number] Arrival date & time: 12/25/21  1519     History Chief Complaint  Patient presents with   Orthostatic Hypotension   Numbness    HPI Shannon [name] is a 56 y.o. female presenting for lightheadedness.  Patient states that she is a 56 year old female who has been going through a significant psychosocial stressor lately.  She endorses a recent ongoing divorce with her spouse.  She states that she has had poor p.o. intake and she has not been taking care of herself.  Today she was attempting to walk around her house when she became acutely lightheaded.  She collapsed to the floor.  She insists she did not syncopized.  She is been having diffuse arthralgias over the last few weeks and states that the pain caught up with her today and she cannot get herself off the ground after this collapse episode.  She states that she called EMS who endorse that her blood pressure dropped 20 points when they tried to stand her up and they ultimately had to assist her to bed.  She denies fevers or chills, nausea vomiting, syncope shortness of breath.   Patient's recorded medical, surgical, social, medication list and allergies were reviewed in the Snapshot window as part of the initial history.   Review of Systems   Review of Systems  Constitutional:  Negative for chills and fever.  HENT:  Negative for ear pain and sore throat.   Eyes:  Negative for pain and visual disturbance.  Respiratory:  Negative for cough and shortness of breath.   Cardiovascular:  Negative for chest pain and palpitations.  Gastrointestinal:  Negative for abdominal pain and vomiting.  Genitourinary:  Negative for dysuria and hematuria.  Musculoskeletal:  Negative for arthralgias and back pain.  Skin:  Negative for color change and rash.  Neurological:  Positive for light-headedness. Negative for seizures and syncope.  All other systems reviewed and are  negative.   Physical Exam Updated Vital Signs BP (!) 140/95   Pulse (!) 108   Temp 98.5 F (36.9 C)   Resp (!) 23   Ht '5\' 4"'$  (1.626 m)   Wt 127.9 kg   SpO2 96%   BMI 48.41 kg/m  Physical Exam Vitals and nursing note reviewed.  Constitutional:      General: She is not in acute distress.    Appearance: She is well-developed.  HENT:     Head: Normocephalic and atraumatic.  Eyes:     Conjunctiva/sclera: Conjunctivae normal.  Cardiovascular:     Rate and Rhythm: Normal rate and regular rhythm.     Heart sounds: No murmur heard. Pulmonary:     Effort: Pulmonary effort is normal. No respiratory distress.     Breath sounds: Normal breath sounds.  Abdominal:     General: There is no distension.     Palpations: Abdomen is soft.     Tenderness: There is no abdominal tenderness. There is no right CVA tenderness or left CVA tenderness.  Musculoskeletal:        General: No swelling or tenderness. Normal range of motion.     Cervical back: Neck supple.  Skin:    General: Skin is warm and dry.  Neurological:     General: No focal deficit present.     Mental Status: She is alert and oriented to person, place, and time. Mental status is at baseline.     Cranial Nerves: No cranial nerve deficit.  ED Course/ Medical Decision Making/ A&P Clinical Course as of 12/25/21 2305  Thu Dec 25, 2021  2206 CT labs ok [CC]    Clinical Course User Index [CC] Tretha Sciara, MD    Procedures Procedures   Medications Ordered in ED Medications  oxyCODONE (Oxy IR/ROXICODONE) immediate release tablet 5 mg (has no administration in time range)  morphine (PF) 4 MG/ML injection 4 mg (4 mg Intravenous Given 12/25/21 1915)  lactated ringers bolus 1,000 mL (0 mLs Intravenous Stopped 12/25/21 2017)  famotidine (PEPCID) tablet 20 mg (20 mg Oral Given 12/25/21 1914)  potassium chloride SA (KLOR-CON M) CR tablet 40 mEq (40 mEq Oral Given 12/25/21 1914)  iohexol (OMNIPAQUE) 300 MG/ML solution 100  mL (100 mLs Intravenous Contrast Given 12/25/21 1910)    Medical Decision Making:    Shannon [name] is a 56 y.o. female who presented to the ED today with multiple symptoms detailed above.     Patient's presentation is complicated by their history of multiple comorbid medical problems.  Patient placed on continuous vitals and telemetry monitoring while in ED which was reviewed periodically.   Complete initial physical exam performed, notably the patient  was hemodynamically stable in no acute distress.      Reviewed and confirmed nursing documentation for past medical history, family history, social history.    Initial Assessment:   This is most consistent with an acute life/limb threatening illness complicated by underlying chronic conditions. History of present illness and physical exam findings are most consistent with nonspecific musculoskeletal pains, considered infection, metabolic disturbance, neurologic etiology including CVA or intracranial hemorrhage grossly less likely.  Notably she does have tenderness to palpation of her abdomen, may be related to underlying abdominal wall nerve entrapment though given her history of poor p.o. intake, I do believe it warrants evaluation for small bowel obstruction..  Initial Plan:  CT abdomen pelvis to evaluate for structural intra-abdominal pathology Screening labs including CBC and Metabolic panel to evaluate for infectious or metabolic etiology of disease.  Urinalysis with reflex culture ordered to evaluate for UTI or relevant urologic/nephrologic pathology.  CXR to evaluate for structural/infectious intrathoracic pathology.  EKG to evaluate for cardiac pathology. Objective evaluation as below reviewed with plan for close reassessment  Initial Study Results:   Laboratory  All laboratory results reviewed without evidence of clinically relevant pathology.   Exceptions include: Hypokalemia  EKG EKG was reviewed independently. Rate,  rhythm, axis, intervals all examined and without medically relevant abnormality. ST segments without concerns for elevations.    Radiology  All images reviewed independently. Agree with radiology report at this time.   CT ABDOMEN PELVIS W CONTRAST  Result Date: 12/25/2021 CLINICAL DATA:  Acute abdominal pain. EXAM: CT ABDOMEN AND PELVIS WITH CONTRAST TECHNIQUE: Multidetector CT imaging of the abdomen and pelvis was performed using the standard protocol following bolus administration of intravenous contrast. RADIATION DOSE REDUCTION: This exam was performed according to the departmental dose-optimization program which includes automated exposure control, adjustment of the mA and/or kV according to patient size and/or use of iterative reconstruction technique. CONTRAST:  19m OMNIPAQUE IOHEXOL 300 MG/ML  SOLN COMPARISON:  CT abdomen and pelvis 06/27/2008 FINDINGS: Lower chest: There are few 2 mm nodular densities in the left lower lobe which were not seen on the prior study. Hepatobiliary: No focal liver abnormality is seen. No gallstones, gallbladder wall thickening, or biliary dilatation. Pancreas: Unremarkable. No pancreatic ductal dilatation or surrounding inflammatory changes. Spleen: Normal in size without focal abnormality. Adrenals/Urinary Tract: Adrenal  glands are unremarkable. Kidneys are normal, without renal calculi, focal lesion, or hydronephrosis. Bladder is unremarkable. Stomach/Bowel: There are postsurgical changes in the stomach. There is a small hiatal hernia. Appendix appears normal. No evidence of bowel wall thickening, distention, or inflammatory changes. Vascular/Lymphatic: No significant vascular findings are present. No enlarged abdominal or pelvic lymph nodes. Reproductive: Uterus and bilateral adnexa are unremarkable. Other: There is trace free fluid in the pelvis which is likely physiologic. No focal abdominal wall hernia. Musculoskeletal: No acute or significant osseous findings.  IMPRESSION: 1. No acute localizing process in the abdomen or pelvis. 2. Trace free fluid in the pelvis is likely physiologic. 3. Small hiatal hernia.  Postsurgical changes in the stomach. 4. 2 mm left solid pulmonary nodules. Per Fleischner Society Guidelines, no routine follow-up imaging is recommended. These guidelines do not apply to immunocompromised patients and patients with cancer. Follow up in patients with significant comorbidities as clinically warranted. For lung cancer screening, adhere to Lung-RADS guidelines. Reference: Radiology. 2017; 284(1):228-43. Electronically Signed   By: Ronney Asters M.D.   On: 12/25/2021 19:17   DG Abdomen Acute W/Chest  Result Date: 12/25/2021 CLINICAL DATA:  Hernia cough numbness and tingling dizziness no bowel movement EXAM: DG ABDOMEN ACUTE WITH 1 VIEW CHEST COMPARISON:  06/10/2021 FINDINGS: There is no evidence of dilated bowel loops or free intraperitoneal air. No radiopaque calculi or other significant radiographic abnormality is seen. Heart size and mediastinal contours are within normal limits. Both lungs are clear. Postsurgical changes of the stomach. Moderate stool burden. IMPRESSION: Negative abdominal radiographs. No acute cardiopulmonary disease. Moderate stool burden Electronically Signed   By: Donavan Foil M.D.   On: 12/25/2021 16:50     Final Assessment and Plan:   Patient observed in the emergency department for 7 hours.  She has had all symptomatic resolution at this time and is resting comfortably.  Pain has resolved.  Heart rate improved from 120s to low 100s.  Had a long conversation with the patient, offered repeat fluid bolus and reassessment of patient after a few more hours of observation given her ongoing diagnostic uncertainty.  However she stated that she was ready to go as her symptoms have improved.  Likely many of her paresthesias are being exacerbated by her multiple metabolic abnormalities.  Electrolytes replaced in emergency  department and short course of outpatient potassium replacement also prescribed.  However patient will need close follow-up with her PCP within 48 hours for reassessment to ensure improvement.  Given her request for discharge, overall well appearance, I think this is reasonable.  Given her fall event earlier today while there were no acute traumatic injuries, she does have some bruising and is having some pain with ambulation that was improved with pain medication administration.  Patient feels comfortable with discharge lessers a small dose of pain medication to utilize at home if her pain started recurring.  Overall I consider this reasonable given her otherwise well appearance.  Potassium replacement and short course of pain control sent to her outpatient pharmacy with plan for patient to return if she has any further decompensation this evening.  Patient discharged with no further acute events.   Disposition:  I have considered need for hospitalization, however, patient's current presentation favors discharge at this time.  Patient/family educated about specific return precautions for given chief complaint and symptoms.  Patient/family educated about follow-up with PCP.     Patient/family expressed understanding of return precautions and need for follow-up. Patient spoken to regarding all imaging  and laboratory results and appropriate follow up for these results. All education provided in verbal form with additional information in written form. Time was allowed for answering of patient questions. Patient discharged.    Emergency Department Medication Summary:   Medications  oxyCODONE (Oxy IR/ROXICODONE) immediate release tablet 5 mg (has no administration in time range)  morphine (PF) 4 MG/ML injection 4 mg (4 mg Intravenous Given 12/25/21 1915)  lactated ringers bolus 1,000 mL (0 mLs Intravenous Stopped 12/25/21 2017)  famotidine (PEPCID) tablet 20 mg (20 mg Oral Given 12/25/21 1914)  potassium  chloride SA (KLOR-CON M) CR tablet 40 mEq (40 mEq Oral Given 12/25/21 1914)  iohexol (OMNIPAQUE) 300 MG/ML solution 100 mL (100 mLs Intravenous Contrast Given 12/25/21 1910)         Clinical Impression:  1. Paresthesia      Discharge   Final Clinical Impression(s) / ED Diagnoses Final diagnoses:  Paresthesia    Rx / DC Orders ED Discharge Orders          Ordered    potassium chloride SA (KLOR-CON M) 20 MEQ tablet  2 times daily        12/25/21 2300    oxyCODONE (ROXICODONE) 5 MG immediate release tablet  Every 4 hours PRN        12/25/21 2300              Tretha Sciara, MD 12/25/21 2307

## 2021-12-25 NOTE — ED Provider Triage Note (Signed)
Emergency Medicine Provider Triage Evaluation Note  Shannon [name] , a 56 y.o. female  was evaluated in triage.  Pt complains of dizziness.  Reports progressive weakness, headache, tingling numbness sensation throughout body, decreased appetite, decrease in bowel movement which has been ongoing for the past 2 weeks.  Endorsed occasional nonproductive cough.  No nausea vomiting diarrhea.  Able to pass flatus.  Noted a mass in abdomen that is painful to the touch and thought it may be a hernia.  EMS arrived today and document patient had positive orthostatic vital signs and she was given IV fluid.  Admits to tobacco use but denies alcohol use.  Review of Systems  Positive: As above Negative: As above  Physical Exam  BP (!) 140/100 (BP Location: Left Arm)   Pulse (!) 115   Temp 98.5 F (36.9 C) (Oral)   Resp 20   SpO2 95%  Gen:   Awake, no distress   Resp:  Normal effort  MSK:   Moves extremities without difficulty  Other:    Medical Decision Making  Medically screening exam initiated at 3:40 PM.  Appropriate orders placed.  Shannon [name] was informed that the remainder of the evaluation will be completed by another provider, this initial triage assessment does not replace that evaluation, and the importance of remaining in the ED until their evaluation is complete.     Domenic Moras, PA-C 12/25/21 1546

## 2021-12-25 NOTE — ED Triage Notes (Signed)
Pt BIB EMS from [facility]. Pt c/o dizziness, numbness and tingling all over, difficulty ambulating x2 weeks. Pt has had little food/drink for the last 2 weeks. Pt c/o cough x2 weeks. Pt states she has not had a bowel movement in over a week. EMS reports pt is orthostatic.  12 Lead with EKG sinus rhythm CBG 134 148/98 sitting  106/70 standing Resp 18 100% RA 20G L wrist  400 mL NS given IV by EMS 650 mg tylenol given by EMS

## 2021-12-31 ENCOUNTER — Encounter: Payer: Self-pay | Admitting: Family Medicine

## 2021-12-31 ENCOUNTER — Telehealth (INDEPENDENT_AMBULATORY_CARE_PROVIDER_SITE_OTHER): Payer: Medicare Other | Admitting: Family Medicine

## 2021-12-31 DIAGNOSIS — K449 Diaphragmatic hernia without obstruction or gangrene: Secondary | ICD-10-CM | POA: Diagnosis not present

## 2021-12-31 DIAGNOSIS — R202 Paresthesia of skin: Secondary | ICD-10-CM

## 2021-12-31 DIAGNOSIS — R109 Unspecified abdominal pain: Secondary | ICD-10-CM | POA: Diagnosis not present

## 2021-12-31 DIAGNOSIS — E876 Hypokalemia: Secondary | ICD-10-CM | POA: Diagnosis not present

## 2021-12-31 DIAGNOSIS — K219 Gastro-esophageal reflux disease without esophagitis: Secondary | ICD-10-CM | POA: Diagnosis not present

## 2021-12-31 DIAGNOSIS — G8929 Other chronic pain: Secondary | ICD-10-CM | POA: Insufficient documentation

## 2021-12-31 MED ORDER — POTASSIUM CHLORIDE CRYS ER 20 MEQ PO TBCR: 20.0000 meq | EXTENDED_RELEASE_TABLET | Freq: Two times a day (BID) | ORAL | 0 refills | Status: DC

## 2021-12-31 NOTE — Assessment & Plan Note (Addendum)
Discussed that her hernia is small and should not be causing her chronic abdominal pain. Referral to GI for further explanation. Recommend antacid, avoid overeating, avoid eating and laying down.

## 2021-12-31 NOTE — Assessment & Plan Note (Signed)
Discussed that her potassium is dangerously low which can affect her muscles including her heart. Most likely contributing to paresthesias.  She did not receive the prescription for potassium replacement in the ED. She is unable to come in today for a visit so I cannot recheck labs.  I will send in a short course of potassium 20 MEQ and strict precaution to follow up here in the office with me in one week and to have labs done at that time.

## 2021-12-31 NOTE — Assessment & Plan Note (Signed)
Most likely due to electrolyte imbalance. Potassium supplement sent to pharmacy.

## 2021-12-31 NOTE — Progress Notes (Signed)
Virtual telephone visit    Virtual Visit via Telephone Note   This visit type was conducted due to national recommendations for restrictions regarding the COVID-19 Pandemic (e.g. social distancing) in an effort to limit this patient's exposure and mitigate transmission in our community. Due to her co-morbid illnesses, this patient is at least at moderate risk for complications without adequate follow up. This format is felt to be most appropriate for this patient at this time. The patient did not have access to video technology or had technical difficulties with video requiring transitioning to audio format only (telephone). Physical exam was limited to content and character of the telephone converstion. CMA was able to get the patient set up on a telephone visit.   Patient location: Home. Patient and provider in visit Provider location: Office  I discussed the limitations of evaluation and management by telemedicine and the availability of in person appointments. The patient expressed understanding and agreed to proceed.   Visit Date: 12/31/2021  Today's healthcare provider: Harland Dingwall, NP-C     Subjective:    Patient ID: Shannon [name], female    DOB: January 18, 1966, 56 y.o.   MRN: [id number]  Chief Complaint  Patient presents with   Hospitalization Follow-up    Spoke with patient and she is unable to get on but refuses to reschedule for in office visit due to pain    HPI  Patient called in and changed her visit from in office to virtual. She was unable to get her camera working so we had to do the visit via telephone call.  Explained that I recommend labs and in person exam however she is unable to come in to the office today.   She was seen by me in the office on 12/25/2021 and sent to the ED for evaluation of dizziness, headache, blurred vision, pins and needles sensations all over and difficulty walking.  She was found to be orthostatic and given IV fluids. She was also  diagnosed with hypokalemia with K 2.5. given oral potassium in the ED but no prescription per patient. I was able to see that a prescription was printed but patient clear that she did not receive a written prescription.  CT abd/pelvis showed a small hiatal hernia and no acute findings.  Abdomen XR with chest showed a moderate stool burden.   She reports having a bowel movement earlier which was normal.   C/o pins and needles sensation all over still and abdominal pain.  She cannot recall her last colonoscopy and I am not sure she has had one.    Past Medical History:  Diagnosis Date   Allergic rhinitis    Allergic rhinitis 01/25/2009        ANEMIA-IRON DEFICIENCY 01/25/2009   Anxiety    Backache 01/25/2009   Qualifier: Diagnosis of  By: Jenny Reichmann MD, Hunt Oris    Bipolar 1 disorder [facility])    Chronic pain syndrome    Complete rotator cuff tear 07/21/2013   Complete tear of right rotator cuff 07/21/2013   Contact lens/glasses fitting    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   [place] DISEASE, LUMBAR 01/25/2009   Essential hypertension 35/05/6566   On Bystolic    GAD (generalized anxiety disorder) 12/28/2013   GERD (gastroesophageal reflux disease)    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   Headache, acute 12/20/2013   Hx of laparoscopic gastric banding 09/03/2010   Surgery date: 07/17/10    HYPERLIPIDEMIA 01/25/2009   Hyperlipidemia with target LDL  less than 130 01/25/2009   HYPERTENSION 01/25/2009   Hypokalemia, inadequate intake 09/01/2011   Insomnia 04/04/2011   INSOMNIA-SLEEP DISORDER-UNSPEC 04/30/2009   Qualifier: Diagnosis of  By: Jenny Reichmann MD, Hunt Oris    Iron deficiency anemia 01/25/2009        Labyrinthitis 02/19/2014   Leiomyoma of uterus, unspecified 08/13/2008   Overview:  Leiomyoma Of The Uterus  10/1 IMO update   Localized edema 06/12/2015   MANIC DEPRESSIVE ILLNESS 01/25/2009   pt is unsue if this is her specifc dx   Mixed bipolar I disorder ([place]) 07/12/2012   Morbid obesity ([place])  01/25/2009   Night sweats    Osteopenia determined by x-ray 06/13/2021   Other abnormal glucose 10/26/2012   Palpitations 10/25/2013   PEPTIC ULCER DISEASE, HELICOBACTER PYLORI POSITIVE 10/03/2009   Peripheral edema 06/25/2015   Pernicious anemia 03/28/2012   Piriformis syndrome of both sides 09/02/2018   PUD (peptic ulcer disease) 10/03/2009        Right shoulder pain 05/03/2013   Dg Shoulder Right  05/03/2013   CLINICAL DATA Pain.  EXAM RIGHT SHOULDER - 2+ VIEW  COMPARISON None.  FINDINGS Acromioclavicular and glenohumeral degenerative change present. Questionable calcific density noted in the region of the supraspinatus space, possibly representing calcific supraspinatus tendinitis. This could represent a sclerotic density in the acromion. MRI of the right shoulder suggest   Sleep apnea 08/12/2016   SMOKER 01/25/2009   Qualifier: Diagnosis of  By: Jenny Reichmann MD, Hunt Oris    Subacromial bursitis 05/17/2013   SUBSTANCE ABUSE 11/06/2009        Thiamin deficiency 10/30/2012   Tobacco abuse 06/09/2010   Visual disturbance 05/03/2013   Vitamin D deficiency 10/27/2012    Past Surgical History:  Procedure Laterality Date   ABDOMINAL HYSTERECTOMY     BLADDER SURGERY     s/p with ?diverticulitis   HAND TENDON SURGERY  1991   s/p-Right-index and middle   LAPAROSCOPIC GASTRIC BAND REMOVAL WITH LAPAROSCOPIC GASTRIC SLEEVE RESECTION N/A 08/17/2016   Procedure: LAPAROSCOPIC GASTRIC BAND REMOVAL WITH LAPAROSCOPIC GASTRIC SLEEVE RESECTION WITH UPPER ENDO;  Surgeon: Alphonsa Overall, MD;  Location: [facility];  Service: General;  Laterality: N/A;   LAPAROSCOPIC GASTRIC BANDING  07/14/10   SHOULDER ARTHROSCOPY Right 07/21/2013   Procedure: RIGHT ARTHROSCOPY SHOULDER DEBRIDMENT EXTENTSIVE,ARTHROSCOPIC REMOVE LOOSE FOREIGN BODY, BICEPS TENOLYSIS ;  Surgeon: Johnny Bridge, MD;  Location: [facility];  Service: Orthopedics;  Laterality: Right;   TUBAL LIGATION      Family History  Problem Relation Age of Onset    Hypertension Mother    Asthma Mother    Stroke Father    Cirrhosis Father        ETOH   Hypertension Father    Diabetes Father    Alcohol abuse Father    Hypertension Sister    Asthma Sister    Hypertension Brother    ADD / ADHD Son    Hypertension Paternal Aunt    Diabetes Maternal Grandmother    ADD / ADHD Other        3 nephews, also emotional issues   Diabetes Other        Uncle   Diabetes Other        Aunt   Suicidality Neg Hx     Social History   Socioeconomic History   Marital status: Married    Spouse name: Not on file   Number of children: 2   Years of education: Not on file  Highest education level: Not on file  Occupational History   Occupation: STUDENT    Employer: UNEMPLOYED  Tobacco Use   Smoking status: Every Day    Packs/day: 0.25    Years: 20.00    Total pack years: 5.00    Types: Cigarettes   Smokeless tobacco: Never   Tobacco comments:    States has cut back to 5-7 a day and working on this.   Vaping Use   Vaping Use: Never used  Substance and Sexual Activity   Alcohol use: No    Alcohol/week: 0.0 standard drinks of alcohol   Drug use: No   Sexual activity: Not Currently  Other Topics Concern   Not on file  Social History Narrative   Moved from [place] to [place] 2009   2 children-1 boy, 1 girl   Disabled-bipolar   Daily Caffeine Use-1 cup/day   Social Determinants of Health   Financial Resource Strain: High Risk (10/07/2021)   Overall Financial Resource Strain (CARDIA)    Difficulty of Paying Living Expenses: Hard  Food Insecurity: Food Insecurity Present (10/07/2021)   Hunger Vital Sign    Worried About Running Out of Food in the Last Year: Often true    Ran Out of Food in the Last Year: Often true  Transportation Needs: No Transportation Needs (10/07/2021)   PRAPARE - Hydrologist (Medical): No    Lack of Transportation (Non-Medical): No  Physical Activity: Inactive (10/07/2021)    Exercise Vital Sign    Days of Exercise per Week: 0 days    Minutes of Exercise per Session: 0 min  Stress: No Stress Concern Present (10/07/2021)   [place]    Feeling of Stress : Not at all  Recent Concern: Stress - Stress Concern Present (10/07/2021)   [place]    Feeling of Stress : Very much  Social Connections: Moderately Isolated (10/07/2021)   Social Connection and Isolation Panel [NHANES]    Frequency of Communication with Friends and Family: Three times a week    Frequency of Social Gatherings with Friends and Family: Never    Attends Religious Services: Never    Marine scientist or Organizations: No    Attends Archivist Meetings: Never    Marital Status: Married  Human resources officer Violence: Not At Risk (10/07/2021)   Humiliation, Afraid, Rape, and Kick questionnaire    Fear of Current or Ex-Partner: No    Emotionally Abused: No    Physically Abused: No    Sexually Abused: No    Outpatient Medications Prior to Visit  Medication Sig Dispense Refill   amLODipine (NORVASC) 5 MG tablet Take 1 tablet (5 mg total) by mouth daily. 30 tablet 2   atorvastatin (LIPITOR) 20 MG tablet Take 1 tablet (20 mg total) by mouth daily. 90 tablet 1   clonazePAM (KLONOPIN) 0.5 MG tablet Take 1 tablet (0.5 mg total) by mouth 3 (three) times daily as needed for anxiety. 75 tablet 2   cyclobenzaprine (FLEXERIL) 10 MG tablet Take 1 tablet (10 mg total) by mouth at bedtime as needed for muscle spasms. 15 tablet 0   FLUoxetine (PROZAC) 20 MG capsule Take 1 capsule (20 mg total) by mouth daily. 30 capsule 2   meloxicam (MOBIC) 15 MG tablet Take 1 tablet (15 mg total) by mouth daily as needed for pain. 14 tablet 0  pantoprazole (PROTONIX) 40 MG tablet Take 1 tablet (40 mg total) by mouth 2 (two) times daily before a meal. 60 tablet 2   QUEtiapine  (SEROQUEL) 25 MG tablet Take 1 tablet (25 mg total) by mouth 2 (two) times daily. 60 tablet 2   QUEtiapine (SEROQUEL) 300 MG tablet Take 1 tablet (300 mg total) by mouth at bedtime. 30 tablet 2   tiZANidine (ZANAFLEX) 4 MG tablet Take 1 tablet (4 mg total) by mouth at bedtime. 14 tablet 0   potassium chloride SA (KLOR-CON M) 20 MEQ tablet Take 1 tablet (20 mEq total) by mouth 2 (two) times daily for 10 days. 20 tablet 0   ARIPiprazole (ABILIFY) 5 MG tablet Take 1 tablet (5 mg total) by mouth daily. (Patient not taking: Reported on 09/09/2021) 30 tablet 1   No facility-administered medications prior to visit.    Allergies  Allergen Reactions   Lamictal [Lamotrigine] Rash    ROS     Objective:    Physical Exam  There were no vitals taken for this visit. Wt Readings from Last 3 Encounters:  12/25/21 282 lb (127.9 kg)  06/17/21 287 lb (130.2 kg)  06/10/21 287 lb (130.2 kg)   Alert and oriented. Speaking in complete sentences.      Assessment & Plan:   Problem List Items Addressed This Visit       Respiratory   Hiatal hernia    Discussed that her hernia is small and should not be causing her chronic abdominal pain. Referral to GI for further explanation. Recommend antacid, avoid overeating, avoid eating and laying down.       Relevant Orders   Ambulatory referral to Gastroenterology     Digestive   GERD (gastroesophageal reflux disease)    Counseling on lifestyle modifications. Recommend antacid. Referral to GI.       Relevant Orders   Ambulatory referral to Gastroenterology     Other   Chronic abdominal pain    Reviewed recent ED visit and CT abd/pelvis with patient. Hx of PUD, GERD, dysphagia.  Referral to GI.       Relevant Orders   Ambulatory referral to Gastroenterology   Hypokalemia, inadequate intake - Primary    Discussed that her potassium is dangerously low which can affect her muscles including her heart. Most likely contributing to paresthesias.  She  did not receive the prescription for potassium replacement in the ED. She is unable to come in today for a visit so I cannot recheck labs.  I will send in a short course of potassium 20 MEQ and strict precaution to follow up here in the office with me in one week and to have labs done at that time.       Relevant Medications   potassium chloride SA (KLOR-CON M) 20 MEQ tablet   Pins and needles sensation    Most likely due to electrolyte imbalance. Potassium supplement sent to pharmacy.        I am having Shannon [name] maintain her amLODipine, atorvastatin, pantoprazole, tiZANidine, meloxicam, cyclobenzaprine, ARIPiprazole, FLUoxetine, QUEtiapine, QUEtiapine, clonazePAM, and potassium chloride SA.  Meds ordered this encounter  Medications   potassium chloride SA (KLOR-CON M) 20 MEQ tablet    Sig: Take 1 tablet (20 mEq total) by mouth 2 (two) times daily for 10 days.    Dispense:  20 tablet    Refill:  0    Order Specific Question:   Supervising Provider    Answer:   Pricilla Holm  A [[id number]]     I discussed the assessment and treatment plan with the patient. The patient was provided an opportunity to ask questions and all were answered. The patient agreed with the plan and demonstrated an understanding of the instructions.   The patient was advised to call back or seek an in-person evaluation if the symptoms worsen or if the condition fails to improve as anticipated.  I provided 23 minutes of non-face-to-face time during this encounter.   Harland Dingwall, NP-C [facility] [place] [phone] (phone) [phone] (fax)  [place]

## 2021-12-31 NOTE — Assessment & Plan Note (Signed)
Counseling on lifestyle modifications. Recommend antacid. Referral to GI.

## 2021-12-31 NOTE — Assessment & Plan Note (Signed)
Reviewed recent ED visit and CT abd/pelvis with patient. Hx of PUD, GERD, dysphagia.  Referral to GI.

## 2022-01-01 ENCOUNTER — Telehealth: Payer: Self-pay | Admitting: *Deleted

## 2022-01-01 NOTE — Telephone Encounter (Signed)
     Patient  visit on 12/25/2021  at [facility]  was for treatment  Patient has a voicemail not set up .  [facility] [phone] [address] , [place] [address] Email : [email or web address]

## 2022-01-02 LAB — VITAMIN B1: Vitamin B1 (Thiamine): 42.3 nmol/L — ABNORMAL LOW (ref 66.5–200.0)

## 2022-01-07 ENCOUNTER — Encounter: Payer: Self-pay | Admitting: Internal Medicine

## 2022-01-13 ENCOUNTER — Telehealth: Payer: Self-pay | Admitting: Family Medicine

## 2022-01-13 NOTE — Telephone Encounter (Addendum)
Caller: Mearl Latin),  Relationship: ( SELF)  -WANTS ANY DR TO SEND RS  Call Back Number: [phone]  Date of Last Office Visit: 12/31/21 virtual  Date of Next Office Visit: 01/20/22  Medication(s) to be Refilled:   PAIN RX  SYMPTOMS: Stinging, needle pricking hands, feet. Tyenol, Excedrin, Ibuprofen does not help   Preferred Pharmacy:  [facility] [address] ([address] [place] [address] [place] [place] ([place]) [place] [address] Phone: [phone]  Fax: [phone]

## 2022-01-14 ENCOUNTER — Ambulatory Visit (INDEPENDENT_AMBULATORY_CARE_PROVIDER_SITE_OTHER): Payer: Medicare Other

## 2022-01-14 ENCOUNTER — Encounter: Payer: Self-pay | Admitting: Family Medicine

## 2022-01-14 ENCOUNTER — Ambulatory Visit (INDEPENDENT_AMBULATORY_CARE_PROVIDER_SITE_OTHER): Payer: Medicare Other | Admitting: Family Medicine

## 2022-01-14 VITALS — BP 104/76 | HR 130 | Temp 97.6°F

## 2022-01-14 DIAGNOSIS — R109 Unspecified abdominal pain: Secondary | ICD-10-CM

## 2022-01-14 DIAGNOSIS — E639 Nutritional deficiency, unspecified: Secondary | ICD-10-CM | POA: Diagnosis not present

## 2022-01-14 DIAGNOSIS — G8929 Other chronic pain: Secondary | ICD-10-CM | POA: Diagnosis not present

## 2022-01-14 DIAGNOSIS — R000 Tachycardia, unspecified: Secondary | ICD-10-CM

## 2022-01-14 DIAGNOSIS — E519 Thiamine deficiency, unspecified: Secondary | ICD-10-CM | POA: Diagnosis not present

## 2022-01-14 DIAGNOSIS — R202 Paresthesia of skin: Secondary | ICD-10-CM | POA: Diagnosis not present

## 2022-01-14 DIAGNOSIS — R7303 Prediabetes: Secondary | ICD-10-CM | POA: Insufficient documentation

## 2022-01-14 DIAGNOSIS — G4730 Sleep apnea, unspecified: Secondary | ICD-10-CM | POA: Diagnosis not present

## 2022-01-14 DIAGNOSIS — I10 Essential (primary) hypertension: Secondary | ICD-10-CM

## 2022-01-14 DIAGNOSIS — R5381 Other malaise: Secondary | ICD-10-CM

## 2022-01-14 DIAGNOSIS — E876 Hypokalemia: Secondary | ICD-10-CM | POA: Diagnosis not present

## 2022-01-14 DIAGNOSIS — K5900 Constipation, unspecified: Secondary | ICD-10-CM

## 2022-01-14 DIAGNOSIS — R1310 Dysphagia, unspecified: Secondary | ICD-10-CM

## 2022-01-14 DIAGNOSIS — E538 Deficiency of other specified B group vitamins: Secondary | ICD-10-CM | POA: Diagnosis not present

## 2022-01-14 DIAGNOSIS — F3160 Bipolar disorder, current episode mixed, unspecified: Secondary | ICD-10-CM

## 2022-01-14 DIAGNOSIS — R29898 Other symptoms and signs involving the musculoskeletal system: Secondary | ICD-10-CM

## 2022-01-14 DIAGNOSIS — Z748 Other problems related to care provider dependency: Secondary | ICD-10-CM | POA: Insufficient documentation

## 2022-01-14 DIAGNOSIS — Z9884 Bariatric surgery status: Secondary | ICD-10-CM

## 2022-01-14 DIAGNOSIS — R079 Chest pain, unspecified: Secondary | ICD-10-CM | POA: Diagnosis not present

## 2022-01-14 LAB — CBC WITH DIFFERENTIAL/PLATELET
Basophils Absolute: 0.1 10*3/uL (ref 0.0–0.1)
Basophils Relative: 0.9 % (ref 0.0–3.0)
Eosinophils Absolute: 0.1 10*3/uL (ref 0.0–0.7)
Eosinophils Relative: 1.6 % (ref 0.0–5.0)
HCT: 44.7 % (ref 36.0–46.0)
Hemoglobin: 14.4 g/dL (ref 12.0–15.0)
Lymphocytes Relative: 20.9 % (ref 12.0–46.0)
Lymphs Abs: 1.1 10*3/uL (ref 0.7–4.0)
MCHC: 32.2 g/dL (ref 30.0–36.0)
MCV: 74.9 fl — ABNORMAL LOW (ref 78.0–100.0)
Monocytes Absolute: 0.4 10*3/uL (ref 0.1–1.0)
Monocytes Relative: 7.4 % (ref 3.0–12.0)
Neutro Abs: 3.8 10*3/uL (ref 1.4–7.7)
Neutrophils Relative %: 69.2 % (ref 43.0–77.0)
Platelets: 268 10*3/uL (ref 150.0–400.0)
RBC: 5.97 Mil/uL — ABNORMAL HIGH (ref 3.87–5.11)
RDW: 19.2 % — ABNORMAL HIGH (ref 11.5–15.5)
WBC: 5.5 10*3/uL (ref 4.0–10.5)

## 2022-01-14 LAB — COMPREHENSIVE METABOLIC PANEL
ALT: 14 U/L (ref 0–35)
AST: 18 U/L (ref 0–37)
Albumin: 3.7 g/dL (ref 3.5–5.2)
Alkaline Phosphatase: 118 U/L — ABNORMAL HIGH (ref 39–117)
BUN: 8 mg/dL (ref 6–23)
CO2: 25 mEq/L (ref 19–32)
Calcium: 8.7 mg/dL (ref 8.4–10.5)
Chloride: 97 mEq/L (ref 96–112)
Creatinine, Ser: 0.99 mg/dL (ref 0.40–1.20)
GFR: 63.27 mL/min (ref >60.00)
Glucose, Bld: 114 mg/dL — ABNORMAL HIGH (ref 70–99)
Potassium: 3.9 mEq/L (ref 3.5–5.1)
Sodium: 132 mEq/L — ABNORMAL LOW (ref 135–145)
Total Bilirubin: 0.5 mg/dL (ref 0.2–1.2)
Total Protein: 8.2 g/dL (ref 6.0–8.3)

## 2022-01-14 LAB — VITAMIN B12: Vitamin B-12: 561 pg/mL (ref 211–911)

## 2022-01-14 LAB — TSH: TSH: 2.2 u[IU]/mL (ref 0.35–5.50)

## 2022-01-14 LAB — MAGNESIUM: Magnesium: 1.6 mg/dL (ref 1.5–2.5)

## 2022-01-14 MED ORDER — GABAPENTIN 100 MG PO CAPS: 100.0000 mg | ORAL_CAPSULE | Freq: Three times a day (TID) | ORAL | 2 refills | Status: DC

## 2022-01-14 MED ORDER — GABAPENTIN 100 MG PO CAPS: 100.0000 mg | ORAL_CAPSULE | Freq: Two times a day (BID) | ORAL | 2 refills | Status: DC

## 2022-01-14 MED ORDER — BARIATRIC MULTIVITAMINS/IRON PO CAPS: 1.0000 | ORAL_CAPSULE | Freq: Every day | ORAL | 2 refills | Status: AC

## 2022-01-14 NOTE — Progress Notes (Signed)
Subjective:     E    Subjective:      Subjective '[]'$ Expand by Default Patient ID: Shannon [name], female    DOB: [birth year], [birth year]  HPI   Here today with c/o continuous pins and needle sensation, fatigue, weakness, tachycardia and dehydration.  States she has not urinated since yesterday morning. Last bowel movement last week.  States she drank 2 bottles of water yesterday.    She now lives alone, her husband moved out. She is going through a divorce. States she still does not eat a lot or drink much water. States she is eating bananas, mashed potatoes, fish, collard greens.   Uses an airfryer. Eating 1 meal daily and snacks on peanuts.   Does not drive.   Having issues walking at home. Has to crawl up her steps as of the past 4-5 days. States she walked to the store, a 3 minute walk but had to crawl up her steps.    She was seen by me in the office on 12/25/2021 and sent to the ED for evaluation of dizziness, headache, blurred vision, pins and needles sensations all over and difficulty walking.  She was found to be orthostatic and given IV fluids. She was also diagnosed with hypokalemia with K 2.5. given oral potassium in the ED but no prescription per patient. I was able to see that a prescription was printed but patient clear that she did not receive a written prescription.  CT abd/pelvis showed a small hiatal hernia and no acute findings.  Abdomen XR with chest showed a moderate stool burden.          Health Maintenance Due  Topic Date Due   HIV Screening  Never done   MAMMOGRAM  06/23/2014   Zoster Vaccines- Shingrix (2 of 2) 08/05/2016   COVID-19 Vaccine (3 - Pfizer risk series) 01/08/2020   Fecal DNA (Cologuard)  01/10/2020    Past Medical History:  Diagnosis Date   Allergic rhinitis    Allergic rhinitis 01/25/2009        ANEMIA-IRON DEFICIENCY 01/25/2009   Anxiety    Backache 01/25/2009   Qualifier: Diagnosis of  By: Jenny Reichmann MD, Hunt Oris    Bipolar 1 disorder  [facility])    Chronic pain syndrome    Complete rotator cuff tear 07/21/2013   Complete tear of right rotator cuff 07/21/2013   Contact lens/glasses fitting    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   [place] DISEASE, LUMBAR 01/25/2009   Essential hypertension 71/0/6269   On Bystolic    GAD (generalized anxiety disorder) 12/28/2013   GERD (gastroesophageal reflux disease)    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   Headache, acute 12/20/2013   Hx of laparoscopic gastric banding 09/03/2010   Surgery date: 07/17/10    HYPERLIPIDEMIA 01/25/2009   Hyperlipidemia with target LDL less than 130 01/25/2009   HYPERTENSION 01/25/2009   Hypokalemia, inadequate intake 09/01/2011   Insomnia 04/04/2011   INSOMNIA-SLEEP DISORDER-UNSPEC 04/30/2009   Qualifier: Diagnosis of  By: Jenny Reichmann MD, Hunt Oris    Iron deficiency anemia 01/25/2009        Labyrinthitis 02/19/2014   Leiomyoma of uterus, unspecified 08/13/2008   Overview:  Leiomyoma Of The Uterus  10/1 IMO update   Localized edema 06/12/2015   MANIC DEPRESSIVE ILLNESS 01/25/2009   pt is unsue if this is her specifc dx   Mixed bipolar I disorder ([place]) 07/12/2012   Morbid obesity ([place]) 01/25/2009   Night sweats    Osteopenia determined  by x-ray 06/13/2021   Other abnormal glucose 10/26/2012   Palpitations 10/25/2013   PEPTIC ULCER DISEASE, HELICOBACTER PYLORI POSITIVE 10/03/2009   Peripheral edema 06/25/2015   Pernicious anemia 03/28/2012   Piriformis syndrome of both sides 09/02/2018   PUD (peptic ulcer disease) 10/03/2009        Right shoulder pain 05/03/2013   Dg Shoulder Right  05/03/2013   CLINICAL DATA Pain.  EXAM RIGHT SHOULDER - 2+ VIEW  COMPARISON None.  FINDINGS Acromioclavicular and glenohumeral degenerative change present. Questionable calcific density noted in the region of the supraspinatus space, possibly representing calcific supraspinatus tendinitis. This could represent a sclerotic density in the acromion. MRI of the right shoulder suggest   Sleep apnea 08/12/2016    SMOKER 01/25/2009   Qualifier: Diagnosis of  By: Jenny Reichmann MD, Hunt Oris    Subacromial bursitis 05/17/2013   SUBSTANCE ABUSE 11/06/2009        Thiamin deficiency 10/30/2012   Tobacco abuse 06/09/2010   Visual disturbance 05/03/2013   Vitamin D deficiency 10/27/2012    Past Surgical History:  Procedure Laterality Date   ABDOMINAL HYSTERECTOMY     BLADDER SURGERY     s/p with ?diverticulitis   HAND TENDON SURGERY  1991   s/p-Right-index and middle   LAPAROSCOPIC GASTRIC BAND REMOVAL WITH LAPAROSCOPIC GASTRIC SLEEVE RESECTION N/A 08/17/2016   Procedure: LAPAROSCOPIC GASTRIC BAND REMOVAL WITH LAPAROSCOPIC GASTRIC SLEEVE RESECTION WITH UPPER ENDO;  Surgeon: Alphonsa Overall, MD;  Location: [facility];  Service: General;  Laterality: N/A;   LAPAROSCOPIC GASTRIC BANDING  07/14/10   SHOULDER ARTHROSCOPY Right 07/21/2013   Procedure: RIGHT ARTHROSCOPY SHOULDER DEBRIDMENT EXTENTSIVE,ARTHROSCOPIC REMOVE LOOSE FOREIGN BODY, BICEPS TENOLYSIS ;  Surgeon: Johnny Bridge, MD;  Location: [place];  Service: Orthopedics;  Laterality: Right;   TUBAL LIGATION      Family History  Problem Relation Age of Onset   Hypertension Mother    Asthma Mother    Stroke Father    Cirrhosis Father        ETOH   Hypertension Father    Diabetes Father    Alcohol abuse Father    Hypertension Sister    Asthma Sister    Hypertension Brother    ADD / ADHD Son    Hypertension Paternal Aunt    Diabetes Maternal Grandmother    ADD / ADHD Other        3 nephews, also emotional issues   Diabetes Other        Uncle   Diabetes Other        Aunt   Suicidality Neg Hx     Social History   Socioeconomic History   Marital status: Married    Spouse name: Not on file   Number of children: 2   Years of education: Not on file   Highest education level: Not on file  Occupational History   Occupation: STUDENT    Employer: UNEMPLOYED  Tobacco Use   Smoking status: Every Day    Packs/day: 0.25    Years: 20.00     Total pack years: 5.00    Types: Cigarettes   Smokeless tobacco: Never   Tobacco comments:    States has cut back to 5-7 a day and working on this.   Vaping Use   Vaping Use: Never used  Substance and Sexual Activity   Alcohol use: No    Alcohol/week: 0.0 standard drinks of alcohol   Drug use: No   Sexual activity: Not Currently  Other Topics Concern   Not on file  Social History Narrative   Moved from [place] to [place] 2009   2 children-1 boy, 1 girl   Disabled-bipolar   Daily Caffeine Use-1 cup/day   Social Determinants of Health   Financial Resource Strain: High Risk (10/07/2021)   Overall Financial Resource Strain (CARDIA)    Difficulty of Paying Living Expenses: Hard  Food Insecurity: Food Insecurity Present (10/07/2021)   Hunger Vital Sign    Worried About Running Out of Food in the Last Year: Often true    Ran Out of Food in the Last Year: Often true  Transportation Needs: No Transportation Needs (10/07/2021)   PRAPARE - Hydrologist (Medical): No    Lack of Transportation (Non-Medical): No  Physical Activity: Inactive (10/07/2021)   Exercise Vital Sign    Days of Exercise per Week: 0 days    Minutes of Exercise per Session: 0 min  Stress: No Stress Concern Present (10/07/2021)   [place]    Feeling of Stress : Not at all  Recent Concern: Stress - Stress Concern Present (10/07/2021)   [place]    Feeling of Stress : Very much  Social Connections: Moderately Isolated (10/07/2021)   Social Connection and Isolation Panel [NHANES]    Frequency of Communication with Friends and Family: Three times a week    Frequency of Social Gatherings with Friends and Family: Never    Attends Religious Services: Never    Marine scientist or Organizations: No    Attends Archivist Meetings:  Never    Marital Status: Married  Human resources officer Violence: Not At Risk (10/07/2021)   Humiliation, Afraid, Rape, and Kick questionnaire    Fear of Current or Ex-Partner: No    Emotionally Abused: No    Physically Abused: No    Sexually Abused: No    Outpatient Medications Prior to Visit  Medication Sig Dispense Refill   amLODipine (NORVASC) 5 MG tablet Take 1 tablet (5 mg total) by mouth daily. 30 tablet 2   ARIPiprazole (ABILIFY) 5 MG tablet Take 1 tablet (5 mg total) by mouth daily. 30 tablet 1   atorvastatin (LIPITOR) 20 MG tablet Take 1 tablet (20 mg total) by mouth daily. 90 tablet 1   clonazePAM (KLONOPIN) 0.5 MG tablet Take 1 tablet (0.5 mg total) by mouth 3 (three) times daily as needed for anxiety. 75 tablet 2   cyclobenzaprine (FLEXERIL) 10 MG tablet Take 1 tablet (10 mg total) by mouth at bedtime as needed for muscle spasms. 15 tablet 0   FLUoxetine (PROZAC) 20 MG capsule Take 1 capsule (20 mg total) by mouth daily. 30 capsule 2   meloxicam (MOBIC) 15 MG tablet Take 1 tablet (15 mg total) by mouth daily as needed for pain. 14 tablet 0   pantoprazole (PROTONIX) 40 MG tablet Take 1 tablet (40 mg total) by mouth 2 (two) times daily before a meal. 60 tablet 2   QUEtiapine (SEROQUEL) 25 MG tablet Take 1 tablet (25 mg total) by mouth 2 (two) times daily. 60 tablet 2   QUEtiapine (SEROQUEL) 300 MG tablet Take 1 tablet (300 mg total) by mouth at bedtime. 30 tablet 2   tiZANidine (ZANAFLEX) 4 MG tablet Take 1 tablet (4 mg total) by mouth at bedtime. 14 tablet 0   potassium chloride SA (KLOR-CON M) 20 MEQ tablet  Take 1 tablet (20 mEq total) by mouth 2 (two) times daily for 10 days. 20 tablet 0   No facility-administered medications prior to visit.    Allergies  Allergen Reactions   Lamictal [Lamotrigine] Rash    ROS     Objective:    Physical Exam Constitutional:      General: She is not in acute distress.    Appearance: She is obese. She is ill-appearing.  HENT:      Mouth/Throat:     Mouth: Mucous membranes are dry.  Eyes:     Extraocular Movements: Extraocular movements intact.     Conjunctiva/sclera: Conjunctivae normal.  Cardiovascular:     Rate and Rhythm: Regular rhythm. Tachycardia present.  Pulmonary:     Effort: Pulmonary effort is normal.     Breath sounds: Normal breath sounds.  Abdominal:     General: There is no distension.     Palpations: Abdomen is soft.     Tenderness: There is abdominal tenderness. There is no guarding or rebound.  Musculoskeletal:     Cervical back: Normal range of motion and neck supple.  Skin:    General: Skin is warm and dry.  Neurological:     General: No focal deficit present.     Mental Status: She is alert and oriented to person, place, and time.     Gait: Gait abnormal.  Psychiatric:        Mood and Affect: Mood normal.        Behavior: Behavior normal.     BP 104/76 (BP Location: Right Arm, Patient Position: Sitting, Cuff Size: Large)   Pulse (!) 130   Temp 97.6 F (36.4 C) (Temporal)   SpO2 96%  Wt Readings from Last 3 Encounters:  12/25/21 282 lb (127.9 kg)  06/17/21 287 lb (130.2 kg)  06/10/21 287 lb (130.2 kg)       Assessment & Plan:   Problem List Items Addressed This Visit       Cardiovascular and Mediastinum   Essential hypertension   Relevant Orders   AMB Referral to Chronic Care Management Services     Respiratory   Sleep apnea   Relevant Orders   AMB Referral to Chronic Care Management Services     Other   Assistance needed with transportation   Relevant Orders   AMB Referral to Chronic Care Management Services   Chronic abdominal pain   Relevant Medications   gabapentin (NEURONTIN) 100 MG capsule   Other Relevant Orders   DG Abd 2 Views (Completed)   Folate deficiency   Relevant Medications   Multiple Vitamins-Minerals (BARIATRIC MULTIVITAMINS/IRON) CAPS   Hx of laparoscopic gastric banding   Relevant Medications   Multiple Vitamins-Minerals (BARIATRIC  MULTIVITAMINS/IRON) CAPS   Hypokalemia   Relevant Orders   Comprehensive metabolic panel (Completed)   Mixed bipolar I disorder ([place])   Relevant Orders   AMB Referral to Chronic Care Management Services   Morbid obesity ([place])   Relevant Orders   Prealbumin   AMB Referral to Chronic Care Management Services   Pins and needles sensation   Relevant Medications   gabapentin (NEURONTIN) 100 MG capsule   Other Relevant Orders   Vitamin B12   Magnesium   Poor diet   Relevant Medications   Multiple Vitamins-Minerals (BARIATRIC MULTIVITAMINS/IRON) CAPS   Other Relevant Orders   Prealbumin   AMB Referral to Chronic Care Management Services   Prediabetes   Tachycardia - Primary   Relevant Orders   CBC  with Differential/Platelet (Completed)   Comprehensive metabolic panel (Completed)   TSH   DG Chest 2 View (Completed)   EKG 12-Lead   Thiamin deficiency   Relevant Medications   Multiple Vitamins-Minerals (BARIATRIC MULTIVITAMINS/IRON) CAPS   Other Relevant Orders   AMB Referral to Chronic Care Management Services   Vitamin B12 deficiency   Relevant Medications   Multiple Vitamins-Minerals (BARIATRIC MULTIVITAMINS/IRON) CAPS   Other Visit Diagnoses     Constipation, unspecified constipation type       Relevant Orders   DG Abd 2 Views (Completed)      Patient is not orthostatic.  Drinking water in the exam room and is able to keep it down.  Awaiting stat labs and chest XR, abdominal XR.  EKG shows sinus tachycardia, left axis deviation which is not new and non specific T wave abnormalities. EKG read by myself and supervising MD Chest XR negative  Abdominal XR negative.   Bariatric multivitamin prescribed.  Gabapentin 100 mg bid prescribed.   Potassium is in normal range now.  She is still tachycardic after closely monitoring her in the room for over an hour. She has not been able to urinate.  Refuses to go to the ED. Her ex husband is waiting on her in the car.    Referral made to chronic care management. She would benefit from these services and is willing to accept them.   Advised her to go to an urgent care for IV fluids and for a potential in and out urine catheter since she has not voided during her visit.   Advised that she will need to call 911 if she is worsening.   Follow up pending lab results.   I have discontinued Shannon [name]'s potassium chloride SA. I have also changed her gabapentin. Additionally, I am having her start on Bariatric Multivitamins/Iron. Lastly, I am having her maintain her amLODipine, atorvastatin, pantoprazole, tiZANidine, meloxicam, cyclobenzaprine, ARIPiprazole, FLUoxetine, QUEtiapine, QUEtiapine, and clonazePAM.  Meds ordered this encounter  Medications   Multiple Vitamins-Minerals (BARIATRIC MULTIVITAMINS/IRON) CAPS    Sig: Take 1 capsule by mouth daily.    Dispense:  30 capsule    Refill:  2    Order Specific Question:   Supervising Provider    Answer:   Pricilla Holm A [[id number]]   DISCONTD: gabapentin (NEURONTIN) 100 MG capsule    Sig: Take 1 capsule (100 mg total) by mouth 3 (three) times daily.    Dispense:  30 capsule    Refill:  2    Order Specific Question:   Supervising Provider    Answer:   Pricilla Holm A [[id number]]   gabapentin (NEURONTIN) 100 MG capsule    Sig: Take 1 capsule (100 mg total) by mouth 2 (two) times daily.    Dispense:  60 capsule    Refill:  2    Order Specific Question:   Supervising Provider    Answer:   Pricilla Holm A [[id number]]

## 2022-01-14 NOTE — Telephone Encounter (Signed)
Called pt and offered for her to come in this morning for an appt per [name], we will move up her appt scheduled on 11/28. She will call back to report if she can do this.

## 2022-01-14 NOTE — Patient Instructions (Addendum)
I RECOMMEND THAT YOU GO TO AN URGENT CARE TO HAVE IV FLUIDS AND POSSIBLY AN IN AND OUT CATHETER TO CHECK YOUR URINE.   I am still waiting on your lab results.  Fortunately your chest x-ray and abdominal x-rays were both negative.  I am prescribing gabapentin for your pain. Take 100 mg twice daily as needed.  Be aware this medication can be sedating.  I also prescribed a multivitamin with iron for you to take to help with your vitamin levels.

## 2022-01-15 LAB — PREALBUMIN: Prealbumin: 14 mg/dL — ABNORMAL LOW (ref 17–34)

## 2022-01-19 ENCOUNTER — Other Ambulatory Visit: Payer: Self-pay | Admitting: Family Medicine

## 2022-01-19 ENCOUNTER — Telehealth: Payer: Self-pay | Admitting: Family Medicine

## 2022-01-19 DIAGNOSIS — R202 Paresthesia of skin: Secondary | ICD-10-CM

## 2022-01-19 MED ORDER — GABAPENTIN 100 MG PO CAPS: 100.0000 mg | ORAL_CAPSULE | Freq: Three times a day (TID) | ORAL | 0 refills | Status: DC

## 2022-01-19 NOTE — Telephone Encounter (Signed)
PT calls today in regards to conversation with Harland Dingwall about setting up [facility]. PT was wanting to know if this was still an option for them and if the process could be started for this?   PT is still having issues walking and getting around the house, keeping up with their home, keeping up with which medications to take, and confirming transportation for their appointments.  CB: [phone]

## 2022-01-19 NOTE — Telephone Encounter (Signed)
Called pt and relayed that we have changed her prescription but encouraged her to please speak with provider before changing how she takes her medications. Pt verbalized understanding.

## 2022-01-19 NOTE — Telephone Encounter (Signed)
Pt is taking gabapentin 3x daily and wants to know if script can be changed to reflect that?   Referral to PT has already been placed.

## 2022-01-19 NOTE — Telephone Encounter (Signed)
Patient is taking Gabapentin 100 mg. - it was prescribed 2 times a day - Patient is having to take it 3 times a day because her pain is severe.  Can dosage be increased - patient would also like to be referred to therapy for walking - patient is having to crawl up steps.  Please advvise.

## 2022-01-19 NOTE — Telephone Encounter (Signed)
Called pt and informed her that we have placed an urgent referral to chronic care management as they can help with all of these issues and that she will not need to do anything at this time, they should be calling her to schedule within the next 7 days. If they do not I requested she let us know and she states she will.

## 2022-01-19 NOTE — Telephone Encounter (Signed)
Pt inquiring about possible PT to help with her walking

## 2022-01-20 ENCOUNTER — Ambulatory Visit: Payer: Medicare Other | Admitting: Family Medicine

## 2022-01-21 ENCOUNTER — Encounter: Payer: Self-pay | Admitting: Neurology

## 2022-01-21 ENCOUNTER — Telehealth: Payer: Self-pay

## 2022-01-21 DIAGNOSIS — R200 Anesthesia of skin: Secondary | ICD-10-CM

## 2022-01-21 DIAGNOSIS — R202 Paresthesia of skin: Secondary | ICD-10-CM

## 2022-01-21 NOTE — Telephone Encounter (Signed)
Called pt back and pt reports she would like to be checked for neuropathy as she does not want to live off pain medications as they have been helping her but does not want to take them 3x daily and wants to see if she can get a diagnosis to get an explanation. Let her know Loletha Carrow is okay with placing a neurology referral and we will go ahead and do that.   Neurology referral placed.

## 2022-01-21 NOTE — Telephone Encounter (Signed)
Pt states she is needing a referral to Neurology for an official dx of Neuropathy and does not wants to be on pains meds 3x a day w/o knowing for sure if she does have Neuropathy. Pt would like a call back if needed to discuss the above.

## 2022-01-23 ENCOUNTER — Telehealth: Payer: Self-pay

## 2022-01-23 ENCOUNTER — Ambulatory Visit (INDEPENDENT_AMBULATORY_CARE_PROVIDER_SITE_OTHER): Payer: Medicare Other

## 2022-01-23 DIAGNOSIS — E785 Hyperlipidemia, unspecified: Secondary | ICD-10-CM

## 2022-01-23 DIAGNOSIS — I10 Essential (primary) hypertension: Secondary | ICD-10-CM

## 2022-01-23 NOTE — Progress Notes (Signed)
  Chronic Care Management   Note  01/23/2022 Name: Shannon [name] MRN: [id number] DOB: 1966/01/14  Shannon [name] is a 56 y.o. year old female who is a primary care patient of [name], [name], NP-C. I reached out to Shannon [name] by phone today in response to a referral sent by Shannon [name]'s PCP.  Shannon [name]  agreedto scheduling an appointment with the CCM RN Case Manager   Follow up plan: Patient agreed to scheduled appointment with RN Case Manager on 01/23/2022(date/time).   Shannon [name], [place], [place] [address] Direct Dial: [phone] [name] [name]

## 2022-01-23 NOTE — Plan of Care (Signed)
Chronic Care Management Provider Comprehensive Care Plan    01/23/2022 Name: Shannon [name] MRN: [id number] DOB: 1965-09-15  Referral to Chronic Care Management (CCM) services was placed by Provider:  Girtha Rm, NP-C on Date: 01/14/22.  Chronic Condition 1: HTN Provider Assessment and Plan  Essential hypertension     Relevant Orders    AMB Referral to Chronic Care Management Services   Expected Outcome/Goals Addressed This Visit (Provider CCM goals/Provider Assessment and plan  Goal: CCM (Hypertension) Expected Outcome:  Monitor, Self-Manage And Reduce Symptoms of Hypertension   Symptom Management Condition 1: Take medications as prescribed   Attend all scheduled provider appointments Call pharmacy for medication refills 3-7 days in advance of running out of medications Call provider office for new concerns or questions  Continue to work with the care management team to address care coordination needs. Seek immediate medical attention for worsening symptoms     Chronic Condition 2: Mixed Bipolar Provider Assessment and Plan  Mixed bipolar I disorder ([place])     Relevant Orders    AMB Referral to Chronic Care Management     Expected Outcome/Goals Addressed This Visit (Provider CCM goals/Provider Assessment and plan  Goal: CCM (Mixed Bipolar Disorder) Expected Outcome:  Monitor, Self-Manage And Reduce Symptoms of Mixed Bipolar Disorder   Symptom Management Condition 2: Take medications as prescribed   Attend all scheduled provider appointments Call pharmacy for medication refills 3-7 days in advance of running out of medications Attend church or other social activities Work with the care management team to address care coordination needs and will continue to work with the clinical team to address health care and disease management related needs Call provider office for new concerns or questions   Problem List Patient Active Problem List   Diagnosis Date Noted    Vitamin B12 deficiency 01/14/2022   Poor diet 01/14/2022   Folate deficiency 01/14/2022   Assistance needed with transportation 01/14/2022   Prediabetes 01/14/2022   Chronic abdominal pain 12/31/2021   Hiatal hernia 12/31/2021   Blurred vision, bilateral 12/25/2021   Pins and needles sensation 12/25/2021   Dizziness 12/25/2021   Upper abdominal pain 12/25/2021   Unable to ambulate 12/25/2021   Osteopenia determined by x-ray 06/13/2021   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   Left hip pain 06/13/2021   Numbness and tingling of both lower extremities 06/10/2021   Dysphagia 08/13/2020   Thrush 08/13/2020   Hoarseness 08/13/2020   Tachycardia 03/09/2020   Rash 03/09/2020   Neck mass 03/09/2020   Toe pain, bilateral 03/09/2020   Piriformis syndrome of both sides 09/02/2018   Routine general medical examination at a health care facility 12/22/2016   Sleep apnea 08/12/2016   Bilateral lower extremity edema 06/12/2015   Colon cancer screening 01/24/2015   GERD (gastroesophageal reflux disease) 12/26/2014   GAD (generalized anxiety disorder) 12/28/2013   Severe headache 12/20/2013   Abnormal vaginal Pap smear 10/25/2013   Thiamin deficiency 10/30/2012   Vitamin D deficiency 10/27/2012   Other abnormal glucose 10/26/2012   Mixed bipolar I disorder ([place]) 07/12/2012   Hypokalemia 09/01/2011   Insomnia 04/04/2011   Visit for screening mammogram 02/26/2011   Hx of laparoscopic gastric banding 09/03/2010   Tobacco abuse 06/09/2010   Hyperlipidemia with target LDL less than 130 01/25/2009   Morbid obesity ([place]) 01/25/2009   Iron deficiency anemia 01/25/2009   Essential hypertension 01/25/2009   Allergic rhinitis 01/25/2009   Degenerative disc disease, lumbar 01/25/2009   Backache 01/25/2009  Leiomyoma of uterus, unspecified 08/13/2008    Medication Management  Current Outpatient Medications:    ARIPiprazole (ABILIFY) 5 MG tablet, Take 1 tablet (5 mg total) by  mouth daily., Disp: 30 tablet, Rfl: 1   clonazePAM (KLONOPIN) 0.5 MG tablet, Take 1 tablet (0.5 mg total) by mouth 3 (three) times daily as needed for anxiety., Disp: 75 tablet, Rfl: 2   FLUoxetine (PROZAC) 20 MG capsule, Take 1 capsule (20 mg total) by mouth daily., Disp: 30 capsule, Rfl: 2   gabapentin (NEURONTIN) 100 MG capsule, Take 1 capsule (100 mg total) by mouth 3 (three) times daily., Disp: 90 capsule, Rfl: 0   QUEtiapine (SEROQUEL) 25 MG tablet, Take 1 tablet (25 mg total) by mouth 2 (two) times daily., Disp: 60 tablet, Rfl: 2   QUEtiapine (SEROQUEL) 300 MG tablet, Take 1 tablet (300 mg total) by mouth at bedtime., Disp: 30 tablet, Rfl: 2   amLODipine (NORVASC) 5 MG tablet, Take 1 tablet (5 mg total) by mouth daily. (Patient not taking: Reported on 01/23/2022), Disp: 30 tablet, Rfl: 2   atorvastatin (LIPITOR) 20 MG tablet, Take 1 tablet (20 mg total) by mouth daily. (Patient not taking: Reported on 01/23/2022), Disp: 90 tablet, Rfl: 1   cyclobenzaprine (FLEXERIL) 10 MG tablet, Take 1 tablet (10 mg total) by mouth at bedtime as needed for muscle spasms., Disp: 15 tablet, Rfl: 0   meloxicam (MOBIC) 15 MG tablet, Take 1 tablet (15 mg total) by mouth daily as needed for pain. (Patient not taking: Reported on 01/23/2022), Disp: 14 tablet, Rfl: 0   Multiple Vitamins-Minerals (BARIATRIC MULTIVITAMINS/IRON) CAPS, Take 1 capsule by mouth daily., Disp: 30 capsule, Rfl: 2   pantoprazole (PROTONIX) 40 MG tablet, Take 1 tablet (40 mg total) by mouth 2 (two) times daily before a meal., Disp: 60 tablet, Rfl: 2   tiZANidine (ZANAFLEX) 4 MG tablet, Take 1 tablet (4 mg total) by mouth at bedtime., Disp: 14 tablet, Rfl: 0  Cognitive Assessment Identity Confirmed: : Name; DOB Cognitive Status: Normal   Functional Assessment Hearing Difficulty or Deaf: no Wear Glasses or Blind: yes Vision Management: Wears Glasses Concentrating, Remembering or Making Decisions Difficulty (CP): no Difficulty Communicating:  no Difficulty Eating/Swallowing: no Walking or Climbing Stairs Difficulty: yes Mobility Management: Reports difficulty ambulating. Attempting to obtain a rollator walker Dressing/Bathing Difficulty: yes Dressing/Bathing Management: Reports no longer recieving assistance from spouse. Would like assistance with in-home services. Doing Errands Independently Difficulty (such as shopping) (CP): yes Errands Management: Reports she will require assistance Change in Functional Status Since Onset of Current Illness/Injury: no   Caregiver Assessment  Primary Source of Support/Comfort: no one People in Home: alone Family Caregiver if Needed: none   Planned Interventions  HTN Reviewed plan for hypertension management. Reviewed medications and indications for use. Patient reports not taking medications consistently. Reports her husband was previously assisting with medication management and overall care. Reports he has moved out. They are currently pending divorce. Thorough discussion regarding importance of adhering to medication regimen and taking medications exactly as prescribed. Agreed to attempt to take medications as prescribed. She will benefit from adherence packages. She is aware of pending outreach with a CCM Pharmacist to discuss options. Provided information regarding established blood pressure parameters along with indications for notifying a provider. Reports currently not monitoring BP. Strongly encouraged to try and monitor at least three times a week. Advised to record readings. Advised to monitor BP daily and record readings.  Discussed symptoms. Denies chest pain, palpitations, or shortness of  breath. Denies recent headaches or visual changes. Discussed compliance with recommended cardiac prudent diet. Encouraged to read nutrition labels, monitor sodium intake, and avoid highly processed foods when possible. Reports significant decline in nutritional intake d/t husband not being  available to prepare meals. Agreeable to referral for nutritional resources. Discussed complications of uncontrolled blood pressure.  Reviewed s/sx of heart attack, stroke and worsening symptoms that require immediate medical attention. Mixed Bipolar Disorder Reviewed plan for treatment of Mixed Bipolar Disorder. Reports care has been established with Psychiatry/Dr. Adele Schilder. Reviewed medications. Reports attempting to take as prescribed. Reports her spouse was previously assisting with medication management. He is no longer in the home. Outreach with CCM Pharmacist pending. Thorough discussion regarding importance of taking medications as prescribed. Verbalizes understanding that medications should not be stopped abruptly. Thorough discussion regarding symptoms and PHQ2-9 assessment. Reports significant decline in overall function. Reports feelings of stress, anxiety, frustration, helpless and depression due to circumstances surrounding her living situation and her husband leaving the home. Reports he is requesting a divorce. Reports he was previously serving as her primary caregiver. She does not feel that she can live alone long term without assistance. Reports she is not receiving financial assistance from her spouse and does not have a way of contacting him. Reports she does not have a support system. She is agreeable to outreach with the [employer].  Denies suicidal ideation. Verbalized need to call 911 if her symptoms worsen over the weekend. Pending outreach with the Care Coordination LCSW. Pending outreach with Dr. Adele Schilder on 02/02/22. Active Listening/Reflection Utilized Emotional support provided. Assessed social determinant of health barriers   Interaction and coordination with outside resources, practitioners, and providers See CCM Referral  Care Plan: Available in MyChart

## 2022-01-23 NOTE — Progress Notes (Signed)
  Care Coordination  Note  01/23/2022 Name: Shannon [name] MRN: [id number] DOB: [birth year]  Shannon [name] is a 56 y.o. year old female who is a primary care patient of [name], [name], NP-C. I reached out to [name] by phone today to offer care coordination services.      Ms. Billing was given information about Care Coordination services today including:  The Care Coordination services include support from the care team which includes your Nurse Coordinator, Clinical Social Worker, or Pharmacist.  The Care Coordination team is here to help remove barriers to the health concerns and goals most important to you. Care Coordination services are voluntary and the patient may decline or stop services at any time by request to their care team member.   Patient agreed to services and verbal consent obtained.   Follow up plan: Telephone appointment with care coordination team member scheduled for:01/26/2022  [name], [place], [place] [address] Direct Dial: [phone] [email or web address]

## 2022-01-23 NOTE — Patient Instructions (Signed)
Please call the care guide team at [phone] if you need to cancel or reschedule your appointment.   If you are experiencing a Mental Health or [place] or need someone to talk to, -Please call the Suicide and Crisis Lifeline: 988 -Call the [place] National Suicide Prevention Lifeline: 303-776-1297 or TTY: 646-234-4856 TTY 281-019-1795) to talk to a trained counselor -Call 1-800-273-TALK (toll free, 24 hour hotline) -Call 911     Goals Addressed             This Visit's Progress    Goal: CCM (Hypertension) Expected Outcome: Monitor, Self-Manage and Reduce Symptoms of Hypertension       Current Barriers:  Chronic Disease Management support and education needs related to Hypertension Lacks caregiver support.  Film/video editor.  Transportation barriers  Planned Interventions: Reviewed plan for hypertension management. Reviewed medications and indications for use. Patient reports not taking medications consistently. Reports her husband was previously assisting with medication management and overall care. Reports he has moved out. They are currently pending divorce. Thorough discussion regarding importance of adhering to medication regimen and taking medications exactly as prescribed. Agreed to attempt to take medications as prescribed. She will benefit from adherence packages. She is aware of pending outreach with a CCM Pharmacist to discuss options. Provided information regarding established blood pressure parameters along with indications for notifying a provider. Reports currently not monitoring BP. Strongly encouraged to try and monitor at least three times a week. Advised to record readings. Advised to monitor BP daily and record readings.  Discussed symptoms. Denies chest pain, palpitations, or shortness of breath. Denies recent headaches or visual changes. Discussed compliance with recommended cardiac prudent diet. Encouraged to read nutrition labels, monitor  sodium intake, and avoid highly processed foods when possible. Reports significant decline in nutritional intake d/t husband not being available to prepare meals. Agreeable to referral for nutritional resources. Discussed complications of uncontrolled blood pressure.  Reviewed s/sx of heart attack, stroke and worsening symptoms that require immediate medical attention.  BP Readings from Last 3 Encounters:  01/14/22 104/76  12/25/21 (!) 153/77  06/17/21 (!) 148/78     Symptom Management: Take medications as prescribed   Attend all scheduled provider appointments Call pharmacy for medication refills 3-7 days in advance of running out of medications Call provider office for new concerns or questions  Continue to work with the care management team to address care coordination needs. Seek immediate medical attention for worsening symptoms  Follow Up Plan:  Will follow up next week       Goal: CCM (Mixed Bipolar Disorder) Expected Outcome:  Monitor, Self-Manage And Reduce Symptoms of Mixed Bipolar Disorder       Current Barriers:  Chronic Disease Management support and education needs related to Mixed Bipolar Disorder Lacks caregiver support.  Film/video editor.  Transportation barriers  Planned Interventions: Reviewed plan for treatment of Mixed Bipolar Disorder. Reports care has been established with Psychiatry/Dr. Adele Schilder. Reviewed medications. Reports attempting to take as prescribed. Reports her spouse was previously assisting with medication management. He is no longer in the home. Outreach with CCM Pharmacist pending. Thorough discussion regarding importance of taking medications as prescribed. Verbalizes understanding that medications should not be stopped abruptly. Thorough discussion regarding symptoms and PHQ2-9 assessment. Reports significant decline in overall function. Reports feelings of stress, anxiety, frustration, helpless and depression due to circumstances surrounding  her living situation and her husband leaving the home. Reports he is requesting a divorce. Reports he was previously serving as her  primary caregiver. She does not feel that she can live alone long term without assistance. Reports she is not receiving financial assistance from her spouse and does not have a way of contacting him. Reports she does not have a support system. She is agreeable to outreach with the [employer].  Denies suicidal ideation. Verbalized need to call 911 if her symptoms worsen over the weekend. Pending outreach with the Care Coordination LCSW. Pending outreach with Dr. Adele Schilder on 02/02/22. Active Listening/Reflection Utilized Emotional support provided. Assessed social determinant of health barriers  Symptom Management: Take medications as prescribed   Attend all scheduled provider appointments Call pharmacy for medication refills 3-7 days in advance of running out of medications Attend church or other social activities Work with the care management team to address care coordination needs and will continue to work with the clinical team to address health care and disease management related needs Call provider office for new concerns or questions   Follow Up Plan:   Will follow up next week             Ms. briany aye understanding of instructions and care plan provided today and agrees to view in [place]. Active MyChart status and patient understanding of how to access instructions and care plan via MyChart confirmed with patient.     A member of the care management team will follow up next week.    Horris Latino RN Care Manager/Chronic Care Management [phone]

## 2022-01-23 NOTE — Progress Notes (Signed)
   Care Guide Note  01/23/2022 Name: Shannon [name] MRN: [id number] DOB: 03-21-1965  Referred by: Girtha Rm, NP-C Reason for referral : Care Coordination (Outreach to schedule with Pharm D and LCSW )   Shannon [name] is a 56 y.o. year old female who is a primary care patient of [name], [name], NP-C. [name] was referred to the pharmacist for assistance related to DM.    Successful contact was made with the patient to discuss pharmacy services including being ready for the pharmacist to call at least 5 minutes before the scheduled appointment time, to have medication bottles and any blood sugar or blood pressure readings ready for review. The patient agreed to meet with the pharmacist via with the pharmacist via telephone visit on (date/time).  02/19/2022  [name], [place], [place] [address] Direct Dial: [phone] [name]

## 2022-01-23 NOTE — Chronic Care Management (AMB) (Signed)
Chronic Care Management   CCM RN Visit Note  01/23/2022 Name: Shannon [name] MRN: [id number] DOB: Mar 09, 1965  Subjective: Shannon [name] is a 56 y.o. year old female who is a primary care patient of [name], [name], NP-C. The patient was referred to the Chronic Care Management team for assistance with care management needs subsequent to provider initiation of CCM services and plan of care.    Today's Visit:  Engaged with patient by telephone for initial visit.     SDOH Interventions Today    Flowsheet Row Most Recent Value  SDOH Interventions   Food Insecurity Interventions Other (Comment)  [Will place referral for Community Resources]  Housing Interventions Other (Comment)  [Will submit referral for Community Resources]  Transportation Interventions Other (Comment)  [Reports attempting to use resources via insurance benefit. Requesting assistance with appt for 01/29/22]  Utilities Interventions Other (Comment)  [Requesting financial assistance]  Depression Interventions/Treatment  Currently on Treatment, Medication, Counseling  Financial Strain Interventions Other (Comment)  [Requesting any available financial assistance]  Physical Activity Interventions Other (Comments)  [Reports decreased activity d/t mobility and depression]  Stress Interventions Other (Comment)  [Reports d/t spouse leaving her without needed support. Currently being treated by Psychiatry.]          Goals Addressed             This Visit's Progress    Goal: CCM (Hypertension) Expected Outcome: Monitor, Self-Manage and Reduce Symptoms of Hypertension       Current Barriers:  Chronic Disease Management support and education needs related to Hypertension Lacks caregiver support.  Film/video editor.  Transportation barriers  Planned Interventions: Reviewed plan for hypertension management. Reviewed medications and indications for use. Patient reports not taking medications consistently. Reports  her husband was previously assisting with medication management and overall care. Reports he has moved out. They are currently pending divorce. Thorough discussion regarding importance of adhering to medication regimen and taking medications exactly as prescribed. Agreed to attempt to take medications as prescribed. She will benefit from adherence packages. She is aware of pending outreach with a CCM Pharmacist to discuss options. Provided information regarding established blood pressure parameters along with indications for notifying a provider. Reports currently not monitoring BP. Strongly encouraged to try and monitor at least three times a week. Advised to record readings. Advised to monitor BP daily and record readings.  Discussed symptoms. Denies chest pain, palpitations, or shortness of breath. Denies recent headaches or visual changes. Discussed compliance with recommended cardiac prudent diet. Encouraged to read nutrition labels, monitor sodium intake, and avoid highly processed foods when possible. Reports significant decline in nutritional intake d/t husband not being available to prepare meals. Agreeable to referral for nutritional resources. Discussed complications of uncontrolled blood pressure.  Reviewed s/sx of heart attack, stroke and worsening symptoms that require immediate medical attention.  BP Readings from Last 3 Encounters:  01/14/22 104/76  12/25/21 (!) 153/77  06/17/21 (!) 148/78     Symptom Management: Take medications as prescribed   Attend all scheduled provider appointments Call pharmacy for medication refills 3-7 days in advance of running out of medications Call provider office for new concerns or questions  Continue to work with the care management team to address care coordination needs. Seek immediate medical attention for worsening symptoms  Follow Up Plan:  Will follow up next week       Goal: CCM (Mixed Bipolar Disorder) Expected Outcome:  Monitor,  Self-Manage And Reduce Symptoms of Mixed Bipolar Disorder  Current Barriers:  Chronic Disease Management support and education needs related to Mixed Bipolar Disorder Lacks caregiver support.  Film/video editor.  Transportation barriers  Planned Interventions: Reviewed plan for treatment of Mixed Bipolar Disorder. Reports care has been established with Psychiatry/Dr. Adele Schilder. Reviewed medications. Reports attempting to take as prescribed. Reports her spouse was previously assisting with medication management. He is no longer in the home. Outreach with CCM Pharmacist pending. Thorough discussion regarding importance of taking medications as prescribed. Verbalizes understanding that medications should not be stopped abruptly. Thorough discussion regarding symptoms and PHQ2-9 assessment. Reports significant decline in overall function. Reports feelings of stress, anxiety, frustration, helpless and depression due to circumstances surrounding her living situation and her husband leaving the home. Reports he is requesting a divorce. Reports he was previously serving as her primary caregiver. She does not feel that she can live alone long term without assistance. Reports she is not receiving financial assistance from her spouse and does not have a way of contacting him. Reports she does not have a support system. She is agreeable to outreach with the [employer].  Denies suicidal ideation. Verbalized need to call 911 if her symptoms worsen over the weekend. Pending outreach with the Care Coordination LCSW. Pending outreach with Dr. Adele Schilder on 02/02/22. Active Listening/Reflection Utilized Emotional support provided. Assessed social determinant of health barriers  Symptom Management: Take medications as prescribed   Attend all scheduled provider appointments Call pharmacy for medication refills 3-7 days in advance of running out of medications Attend church or other social  activities Work with the care management team to address care coordination needs and will continue to work with the clinical team to address health care and disease management related needs Call provider office for new concerns or questions   Follow Up Plan:   Will follow up next week               PLAN: Will follow up next week    [place] Manager/Chronic Care Management [phone]

## 2022-01-26 ENCOUNTER — Other Ambulatory Visit: Payer: Self-pay | Admitting: Sports Medicine

## 2022-01-26 ENCOUNTER — Ambulatory Visit: Payer: Self-pay | Admitting: Licensed Clinical Social Worker

## 2022-01-26 MED ORDER — MELOXICAM 15 MG PO TABS: 15.0000 mg | ORAL_TABLET | Freq: Every day | ORAL | 0 refills | Status: DC | PRN

## 2022-01-26 NOTE — Telephone Encounter (Signed)
I believe she is referring to the protonix, it has been a while. Ok to refill?

## 2022-01-26 NOTE — Patient Instructions (Signed)
Visit Information  Thank you for taking time to visit with me today. Please don't hesitate to contact me if I can be of assistance to you before our next scheduled telephone appointment.  Following are the goals we discussed today:   Our next appointment is by telephone on 02/02/22 at 10:30 AM   Please call the care guide team at [phone] if you need to cancel or reschedule your appointment.   If you are experiencing a Mental Health or [place] or need someone to talk to, please go to [facility] [place] [phone])   Following is a copy of your full plan of care:   Interventions:  Talked with client about program support Discussed marital situation with client. She said she and spouse had been living in apartment previously and that he had been providing most of her care support. He did cooking, cleaning and provided transport for client. Client said that they had moved recently to a different apartment. Client is set up in apartment, has microwave to use, has basic belongings.However, she said spouse no longer was living with her and was no longer able to provide care for her.  She said spouse had moved away from apartment and would not tell client where he had moved. Client was stressed about current situation Discussed meal needs. She said she does have microwave but eats simple foods, simple meals.  She said she had difficulty sometimes in walking. She said she was getting ready to order a cane through [facility]. She currently has no cane or walker to use She spoke of hernia issues. She has appointment on 02/12/22 with gastroenterologist to discuss hernia issues of client She spoke of transport challenges.  She said she had called [employer] but that she did not qualify for transport benefits since she did not have Medicaid.  She called 2 numbers [employer] had provided her but said she did not qualify for insurance transport  help. She said she gets short of breath occasionally. She said she has no other family support. She spoke of financial challenges and said paying rent in new apartment may be difficult. She gets Social Security Disability income per month . Spoke with client about medication procurement Spoke with client about support from [employer], NP-C. She spoke of mental health needs and said she sees psychiatrist for mental health support. She has appointment with psychiatrist in December of 2023 She has been referred to CCM support with Donald Siva RN.  LCSW is consulting with Solmon Ice regarding various client needs.  Ms. Pommier was given information about Care Management services by the embedded care coordination team including:  Care Management services include personalized support from designated clinical staff supervised by her physician, including individualized plan of care and coordination with other care providers 24/7 contact phone numbers for assistance for urgent and routine care needs. The patient may stop CCM services at any time (effective at the end of the month) by phone call to the office staff.  Patient agreed to services and verbal consent obtained.   Norva Riffle.Vayda Dungee MSW, [place] Holiday representative [facility] Care Management [phone]

## 2022-01-26 NOTE — Patient Outreach (Signed)
Care Coordination   Initial Visit Note   01/26/2022 Name: Shannon [name] MRN: [id number] DOB: [birth year]  Shannon [name] is a 56 y.o. year old female who sees [place], [place], NP-C for primary care. I spoke with  [name] by phone today.  What matters to the patients health and wellness today? Client is stressed since her husband has left and he was providing most of her care. Client is disabled    Goals Addressed               This Visit's Progress     patient is stressed since her husband has left and he was providing most of her care (pt-stated)        Interventions: Talked with client about program support Discussed marital situation with client. She said she and spouse had been living in apartment previously and that he had been providing most of her care support. He did cooking, cleaning and provided transport for client. Client said that they had moved recently to a different apartment. Client is set up in apartment, has microwave to use, has basic belongings.However, she said spouse no longer was living with her and was no longer able to provide care for her.  She said spouse had moved away from apartment and would not tell client where he had moved. Client was stressed about current situation Discussed meal needs. She said she does have microwave but eats simple foods, simple meals.  She said she had difficulty sometimes in walking. She said she was getting ready to order a cane through [facility]. She currently has no cane or walker to use She spoke of hernia issues. She has appointment on 02/12/22 with gastroenterologist to discuss hernia issues of client She spoke of transport challenges.  She said she had called [employer] but that she did not qualify for transport benefits since she did not have Medicaid.  She called 2 numbers [employer] had provided her but said she did not qualify for insurance transport help. She said she gets short of breath occasionally. She said she has no other  family support. She spoke of financial challenges and said paying rent in new apartment may be difficult. She gets Social Security Disability income per month . Spoke with client about medication procurement Spoke with client about support from [employer], NP-C. She spoke of mental health needs and said she sees psychiatrist for mental health support. She has appointment with psychiatrist in December of 2023 She has been referred to CCM support with Donald Siva RN.  LCSW is consulting with Solmon Ice regarding various client needs.      SDOH assessments and interventions completed:  Yes  SDOH Interventions Today    Flowsheet Row Most Recent Value  SDOH Interventions   Depression Interventions/Treatment  Currently on Treatment  Physical Activity Interventions Other (Comments)  [she has walking challenges. she is trying to order a cane or a walker]  Stress Interventions Provide Counseling  Burnett Sheng is Disabled. She did have support of her spouse,  but he is no longer living with her. Marlana Salvage lives in a 2nd floor apartment and has difficulty going up and down steps at apartment. Stress issues related to meal prep and finances and transportation]        Care Coordination Interventions:  Yes, provided   Follow up plan: Follow up call scheduled for 02/02/22 at 10:30 AM    Encounter Outcome:  Pt. Visit Completed   Shannon [name].Chantz Montefusco MSW, LCSW Licensed Clinical  Social Worker [facility] Care Management [phone]

## 2022-01-27 ENCOUNTER — Telehealth: Payer: Self-pay | Admitting: Family Medicine

## 2022-01-27 ENCOUNTER — Ambulatory Visit: Payer: Medicare Other

## 2022-01-27 ENCOUNTER — Telehealth: Payer: Medicare Other

## 2022-01-27 DIAGNOSIS — F3160 Bipolar disorder, current episode mixed, unspecified: Secondary | ICD-10-CM

## 2022-01-27 MED ORDER — PANTOPRAZOLE SODIUM 40 MG PO TBEC: 40.0000 mg | DELAYED_RELEASE_TABLET | Freq: Two times a day (BID) | ORAL | 0 refills | Status: DC

## 2022-01-27 NOTE — Telephone Encounter (Signed)
Patient called very upset stating that she has not received a call for her scheduled appt at 3:30 for today. Patient would like a call as soon as possible regarding why she has not received a call today. Best callback number is [phone].

## 2022-01-27 NOTE — Telephone Encounter (Signed)
Good Afternoon Shannon [name].  This issue has been resolved. Her outreach today was not for her PCP. Both the LCSW and I were able to speak with her today regarding her care management needs. Apologize for any confusion.

## 2022-01-27 NOTE — Addendum Note (Signed)
Addended by: Rossie Muskrat on: 01/27/2022 07:35 AM   Modules accepted: Orders

## 2022-01-28 ENCOUNTER — Telehealth: Payer: Medicare Other

## 2022-01-28 ENCOUNTER — Ambulatory Visit: Payer: Self-pay | Admitting: Licensed Clinical Social Worker

## 2022-01-28 ENCOUNTER — Ambulatory Visit: Payer: Self-pay

## 2022-01-28 DIAGNOSIS — F3160 Bipolar disorder, current episode mixed, unspecified: Secondary | ICD-10-CM

## 2022-01-28 NOTE — Patient Outreach (Signed)
  Care Coordination   Follow Up Visit Note   01/28/2022 Name: Shannon [name] MRN: [id number] DOB: [birth year]  Shannon [name] is a 56 y.o. year old female who sees [place], [place], NP-C for primary care. I spoke with  Shannon Siva RN  by phone today.regarding client needs  What matters to the patients health and wellness today? Patient is stressed since her husband has left and he was providing most of her care   Interventions:  Talked with Shannon Siva RN about client needs:  SDOH needs, nursing needs, financial needs, transport needs Discussed client marital situation with Shannon [name].  Client is in new apartment . Client is now living alone; her spouse left her and did not give her his new address. He was previously providing most of the care for client Discussed meal prep for client. Concern that client may not be eating meals on regular schedule Discussed stress level of client and mood of client. Discussed referrals needed. Shannon [name] plans to send Care Guide referrals today to Care Guide for transport help, food help, utility help.  Pharmacy referral has been made for client and pharmacist is scheduled to call client LCSW and Shannon [name] spoke of NP Shannon [name] and referral to Shannon [name] for a home visit with client. Shannon [name] to send referral for home visit with client to Shannon Canary NP today Spoke of client hernia issues. Client scheduled for appointment with gastroenterologist on 02/12/22 Spoke of transport needs of client and about Care Guide assist with client for arranging transport help for her to and from her appointments. Spoke with Shannon [name] about medication procurement of client Spoke with Shannon [name] about APS possible referral for client.  Shannon [name] to speak with client at her next call about possible APS support and referral  Spoke with client about support from Shannon Rm, NP-C. She spoke of mental health needs and said she sees psychiatrist for mental  health support. She has appointment with psychiatrist in December of 2023 She has been referred to CCM support with Shannon Siva RN.  LCSW is consulting with Shannon [name] regarding various client needs. Discussed financial strain of client  SDOH assessments and interventions completed:  Yes     Care Coordination Interventions:  Yes, provided   Follow up plan: Follow up call scheduled for LCSW and client for 02/02/22    Encounter Outcome:  Pt. Visit Completed   Norva Riffle.Shannon [name] MSW, [place] Holiday representative [facility] Care Management [phone]

## 2022-01-29 ENCOUNTER — Ambulatory Visit: Payer: Medicare Other | Admitting: Physical Therapy

## 2022-01-30 ENCOUNTER — Telehealth: Payer: Self-pay

## 2022-01-30 NOTE — Telephone Encounter (Signed)
   CCM RN Visit Note   01/30/22 Name: JACQULIN BRANDENBURGER MRN: [id number]      DOB: [birth year]  Subjective: MARQUELLE BALOW is a 56 y.o. year old female who is a primary care patient of [name], [name], NP-C. The patient was referred to the Chronic Care Management team for assistance with care management needs subsequent to provider initiation of CCM services and plan of care.      Unsuccessful outreach attempt today. Attempted to reach patient to discuss options for needed assistance.  PLAN A HIPAA compliant phone message was left for the patient providing contact information and requesting a return call.   Horris Latino RN Care Manager/Chronic Care Management [phone]

## 2022-02-02 ENCOUNTER — Telehealth: Payer: Self-pay | Admitting: *Deleted

## 2022-02-02 ENCOUNTER — Telehealth (HOSPITAL_BASED_OUTPATIENT_CLINIC_OR_DEPARTMENT_OTHER): Payer: Medicare Other | Admitting: Psychiatry

## 2022-02-02 ENCOUNTER — Encounter (HOSPITAL_COMMUNITY): Payer: Self-pay | Admitting: Psychiatry

## 2022-02-02 ENCOUNTER — Ambulatory Visit: Payer: Self-pay | Admitting: Licensed Clinical Social Worker

## 2022-02-02 VITALS — Wt 282.0 lb

## 2022-02-02 DIAGNOSIS — F4310 Post-traumatic stress disorder, unspecified: Secondary | ICD-10-CM | POA: Diagnosis not present

## 2022-02-02 DIAGNOSIS — F609 Personality disorder, unspecified: Secondary | ICD-10-CM

## 2022-02-02 DIAGNOSIS — F319 Bipolar disorder, unspecified: Secondary | ICD-10-CM | POA: Diagnosis not present

## 2022-02-02 MED ORDER — FLUOXETINE HCL 20 MG PO CAPS: 20.0000 mg | ORAL_CAPSULE | Freq: Every day | ORAL | 1 refills | Status: DC

## 2022-02-02 MED ORDER — CLONAZEPAM 0.5 MG PO TABS: 0.5000 mg | ORAL_TABLET | Freq: Three times a day (TID) | ORAL | 2 refills | Status: DC | PRN

## 2022-02-02 MED ORDER — QUETIAPINE FUMARATE 300 MG PO TABS: 300.0000 mg | ORAL_TABLET | Freq: Every day | ORAL | 1 refills | Status: DC

## 2022-02-02 MED ORDER — QUETIAPINE FUMARATE 25 MG PO TABS: 25.0000 mg | ORAL_TABLET | Freq: Two times a day (BID) | ORAL | 1 refills | Status: DC

## 2022-02-02 NOTE — Patient Instructions (Addendum)
Visit Information  Thank you for taking time to visit with me today. Please don't hesitate to contact me if I can be of assistance to you before our next scheduled telephone appointment.  Following are the goals we discussed today:   Next appointment (phone call) is scheduled for 02/10/22 at 3:30 PM   Please call the care guide team at [phone] if you need to cancel or reschedule your appointment.   If you are experiencing a Mental Health or [place] or need someone to talk to, please go to [facility] [place] [phone])   Following is a copy of your full plan of care:   Interventions:  Spoke via phone today with client about her care needs. Spoke of client hernia issues. Client scheduled for appointment with gastroenterologist on 02/12/22 Spoke of transport needs of client and about Care Guide assist with client for arranging transport help for her to and from her appointments. Informed client that Donald Siva RN had sent referral orders to Care Guides for client for help with transport, food and utility needs Spoke with client about support from Girtha Rm, NP-C. Discussed possible APS referral for client with Ulyses Amor. She said she would like to try to stay where she now resides, though she cannot afford rent of condominium where she resides. She said condominium is owned by [employer] (phone [phone]).  She said Lattie Corns has been very helpful to client and visited with client recently.  Client said she had talked with Lattie Corns about applying for Section 8 Housing for [employer] where client resides.  Client consented verbally to LCSW today for LCSW to call Lattie Corns condominium owner to discuss client needs and applying for Section 8 housing.  Client agreed today via phone for LCSW to call Lattie Corns to discuss client housing needs and financial challenges. Discussed fall risk of  client. Client has rollator walker now and said she is using rollator walker Client and LCSW discussed food needs of client. She said she orders food from [facility] and that [facility] delivers her food to her  Discussed ADLs completion of client Discussed client support with psychiatrist. Client said she is scheduled to talk at 2:30 PM today with psychiatrist. She said support of psychiatrist is very helpful to her Discussed fact that she has phone number of her spouse but does not have address where spouse lives.  Discussed physical therapy needs. Client said she is scheduled to start physical therapy support this week. Discussed medication procurement Discussed role of APS and ways APS can be helpful related to certain needs of individuals. After discussion with Ulyses Amor, she agreed for LCSW to call APS to report her needs and current situation with APS. She knows APS may call her and may do home visit with her.  Client has been referred to CCM support with Donald Siva RN.  LCSW is consulting with Solmon Ice regarding various client needs. Discussed financial strain of client  Shannon [name] was given information about Care Management services by the embedded care coordination team including:  Care Management services include personalized support from designated clinical staff supervised by her physician, including individualized plan of care and coordination with other care providers 24/7 contact phone numbers for assistance for urgent and routine care needs. The patient may stop CCM services at any time (effective at the end of the month) by phone call to the office staff.  Patient agreed to services and verbal  consent obtained.   Norva Riffle.Shannon [name] MSW, [place] Holiday representative [facility] Care Management [phone]

## 2022-02-02 NOTE — Patient Outreach (Signed)
Care Coordination   Follow Up Visit Note   02/02/2022 Name: Shannon [name] MRN: [id number] DOB: 06/17/1965  Shannon [name] is a 56 y.o. year old female who sees [place], [place], NP-C for primary care. I spoke with  [name] by phone today.  What matters to the patients health and wellness today? Patient is stressed since her husband has left and he was providing most of her care.     Goals Addressed               This Visit's Progress     patient is stressed since her husband has left and he was providing most of her care (pt-stated)        Interventions:  Spoke via phone today with client about her care needs. Spoke of client hernia issues. Client scheduled for appointment with gastroenterologist on 02/12/22 Spoke of transport needs of client and about Care Guide assist with client for arranging transport help for her to and from her appointments. Informed client that Donald Siva RN had sent referral orders to Care Guides for client for help with transport, food and utility needs Spoke with client about support from Girtha Rm, NP-C. Discussed possible APS referral for client with Ulyses Amor. She said she would like to try to stay where she now resides, though she cannot afford rent of condominium where she resides. She said condominium is owned by [employer] (phone [phone]).  She said Lattie Corns has been very helpful to client and visited with client recently.  Client said she had talked with Lattie Corns about applying for Section 8 Housing for [employer] where client resides.  Client consented verbally to LCSW today for LCSW to call Lattie Corns condominium owner to discuss client needs and applying for Section 8 housing.  Client agreed today via phone for LCSW to call Lattie Corns to discuss client housing needs and financial challenges. Discussed fall risk of client. Client has rollator walker now and said she is using rollator walker Client and LCSW  discussed food needs of client. She said she orders food from [facility] and that [facility] delivers her food to her  Discussed ADLs completion of client Discussed client support with psychiatrist. Client said she is scheduled to talk at 2:30 PM today with psychiatrist. She said support of psychiatrist is very helpful to her Discussed fact that she has phone number of her spouse but does not have address where spouse lives.  Discussed physical therapy needs. Client said she is scheduled to start physical therapy support this week. Discussed medication procurement Discussed role of APS and ways APS can be helpful related to certain needs of individuals. Client agreed for LCSW to all APS and report to APS the current needs and status of client. Client consented to LCSW calling APS to make report to APS about client needs and status  Client has been referred to CCM support with Donald Siva RN.  LCSW is consulting with Solmon Ice regarding various client needs. Discussed financial strain of client       SDOH assessments and interventions completed:  Yes  SDOH Interventions Today    Flowsheet Row Most Recent Value  SDOH Interventions   Depression Interventions/Treatment  Currently on Treatment  Physical Activity Interventions Other (Comments)  [walking challenges. she uses a walker to help her walk]  Stress Interventions Other (Comment)  [client has stress related to managing medical needs and managing daily care needs for herself]  Care Coordination Interventions:  Yes, provided   Follow up plan: Follow up call scheduled for 02/10/22 at 3:30 PM     Encounter Outcome:  Pt. Visit Completed   Norva Riffle.Minal Stuller MSW, [place] Holiday representative [facility] Care Management [phone]

## 2022-02-02 NOTE — Patient Instructions (Signed)
Thank you for allowing the Chronic Care Management team to participate in your care.    If you are experiencing a Mental Health or [place] or need someone to talk to,  Please call the Suicide and Crisis Lifeline: 988 Call the [place] National Suicide Prevention Lifeline: (910) 030-4032 or TTY: 725-389-8643 TTY (878)607-1258) to talk to a trained counselor Call 1-800-273-TALK (toll free, 24 hour hotline) Go to [facility] [address] [place] [phone]) Call 911   Following is a copy of your full provider care plan:   Goals Addressed             This Visit's Progress    Goal: CCM (Mixed Bipolar Disorder) Expected Outcome:  Monitor, Self-Manage And Reduce Symptoms of Mixed Bipolar Disorder       Current Barriers:  Chronic Disease Management support and education needs related to Mixed Bipolar Disorder Lacks caregiver support.  Film/video editor.  Transportation barriers  Planned Interventions: Reviewed plan for treatment of Mixed Bipolar Disorder.  Reviewed symptoms and concerns since last outreach. Voiced additional concerns today regarding living arrangements. Reports the rental condo is on the 2nd floor. Expressed concerns that she will not be able to afford the condo in the near future d/t being on a fixed income. Wants to discuss options should she have to relocate. Thorough discussion regarding ability to perform self care and complete basic needs. Reports being able to complete some tasks. Reports the fatigue, mood and impaired greatly affect her motivation to complete tasks. Reports requiring a legal guardian in the past and being placed in a facility for treatment and assistance with life skills. Reports she is not opposed to returning to a facility if that is her only option for receiving assistance.  Discussed options for community support such as church groups, neighbors and family. Reports she is not receiving  support from church groups or two adult children. Reports not knowing any of her neighbors.  Completed outreach with the LCSW on yesterday. Advised that we would collaborate to discuss options for optimal care.  Denies suicidal ideation today.Verbalized need to call 911 if her symptoms worsen over the weekend. Active Listening/Reflection Utilized Emotional support provided.  Symptom Management: Take medications as prescribed   Attend all scheduled provider appointments Call pharmacy for medication refills 3-7 days in advance of running out of medications Attend church or other social activities Work with the care management team to address care coordination needs and will continue to work with the clinical team to address health care and disease management related needs Call provider office for new concerns or questions  Call the Suicide and Crisis Lifeline: 988 Call the [place] National Suicide Prevention Lifeline: 636-848-1798 or TTY: 801-541-5988 TTY 226-463-7250) to talk to a trained counselor Call 1-800-273-TALK (toll free, 24 hour hotline) Go to [facility] [address], [place] [phone]) Call 911 if experiencing a Mental Health or [place]   Follow Up Plan:   Will follow up next week            Ms. Areola verbalizes understanding of instructions and care plan provided. Agrees to view in MyChart.    Will follow up this week   [place] Manager/Chronic Care Management [phone]

## 2022-02-02 NOTE — Patient Outreach (Signed)
  Care Coordination   Follow Up Visit Note   02/02/2022 Name: Shannon [name] MRN: [id number] DOB: 11-Jul-1965  Shannon [name] is a 56 y.o. year old female who sees [place], [place], NP-C for primary care. I spoke by phone today with representative of [facility] [employer]. Client had requested LCSW make referral to APS for client.  What matters to the patients health and wellness today?  Patient is stressed since her husband has left and he was providing most of her care.    Goals Addressed               This Visit's Progress     patient is stressed since her husband has left and he was providing most of her care (pt-stated)        Interventions:  Client consented today for LCSW to call [facility] and make client referral LCSW called [facility] APS today and made APS referral for client APS representative took report from LCSW on client needs. Representative of [facility] APS called LCSW back later this afternoon and reported to LCSW that a Education officer, museum had been assigned to client  case from [place].  Social Worker from [place] will be investigating client needs.        SDOH assessments and interventions completed:  Yes     Care Coordination Interventions:  Yes, provided   Follow up plan: Follow up call scheduled for LCSW Shannon [name] and client for 02/10/22 at 3:30 PM     Encounter Outcome:  Pt. Visit Completed   Shannon [name].Shannon [name] MSW, [place] Holiday representative [facility] Care Management [phone]

## 2022-02-02 NOTE — Progress Notes (Signed)
  Care Coordination  Outreach Note  02/02/2022 Name: LYNDSY GILBERTO MRN: [id number] DOB: December 22, 1965   Care Coordination Outreach Attempts: An unsuccessful telephone outreach was attempted today to offer the patient information about available care coordination services as a benefit of their health plan.   Referral received   Follow Up Plan:  Additional outreach attempts will be made to offer the patient care coordination information and services.   Encounter Outcome:  No Answer  Julian Hy, [place] Direct Dial: [phone]
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
## 2022-02-02 NOTE — Chronic Care Management (AMB) (Signed)
  Chronic Care Management   CCM RN Visit Note   Name: Shannon [name] MRN: [id number] DOB: 24-Jun-1965  Subjective: Shannon [name] is a 56 y.o. year old female who is a primary care patient of [name], [name], NP-C. The patient was referred to the Chronic Care Management team for assistance with care management needs subsequent to provider initiation of CCM services and plan of care.    Today's Visit:  Engaged with patient by telephone for follow up visit.        Goals Addressed             This Visit's Progress    Goal: CCM (Mixed Bipolar Disorder) Expected Outcome:  Monitor, Self-Manage And Reduce Symptoms of Mixed Bipolar Disorder       Current Barriers:  Chronic Disease Management support and education needs related to Mixed Bipolar Disorder Lacks caregiver support.  Film/video editor.  Transportation barriers  Planned Interventions: Reviewed plan for treatment of Mixed Bipolar Disorder.  Reviewed symptoms and concerns since last outreach. Voiced additional concerns today regarding living arrangements. Reports the rental condo is on the 2nd floor. Expressed concerns that she will not be able to afford the condo in the near future d/t being on a fixed income. Wants to discuss options should she have to relocate. Thorough discussion regarding ability to perform self care and complete basic needs. Reports being able to complete some tasks. Reports the fatigue, mood and impaired greatly affect her motivation to complete tasks. Reports requiring a legal guardian in the past and being placed in a facility for treatment and assistance with life skills. Reports she is not opposed to returning to a facility if that is her only option for receiving assistance.  Discussed options for community support such as church groups, neighbors and family. Reports she is not receiving support from church groups or two adult children. Reports not knowing any of her neighbors.  Completed outreach  with the LCSW on yesterday. Advised that we would collaborate to discuss options for optimal care.  Denies suicidal ideation today.Verbalized need to call 911 if her symptoms worsen over the weekend. Active Listening/Reflection Utilized Emotional support provided.  Symptom Management: Take medications as prescribed   Attend all scheduled provider appointments Call pharmacy for medication refills 3-7 days in advance of running out of medications Attend church or other social activities Work with the care management team to address care coordination needs and will continue to work with the clinical team to address health care and disease management related needs Call provider office for new concerns or questions  Call the Suicide and Crisis Lifeline: 988 Call the [place] National Suicide Prevention Lifeline: 442-209-8920 or TTY: (684)473-3357 TTY 508-874-7666) to talk to a trained counselor Call 1-800-273-TALK (toll free, 24 hour hotline) Go to [facility] [address] [place] [phone]) Call 911 if experiencing a Mental Health or [place]   Follow Up Plan:   Will follow up this week            PLAN Will follow up this week   [place] Manager/Chronic Care Management [phone]

## 2022-02-03 NOTE — Progress Notes (Deleted)
[facility] Clinic Note - Initial Visit   Date: 02/04/2022   THOMAS RHUDE MRN: [id number] DOB: Jan 07, 1966   Dear Harland Dingwall, NP-C:  Thank you for your kind referral of [name] for consultation of ***. Although her history is well known to you, please allow [name] to reiterate it for the purpose of our medical record. The patient was accompanied to the clinic by *** who also provides collateral information.     DONNICE NIELSEN is a 56 y.o. ***-handed female with *** presenting for evaluation of ***.   IMPRESSION/PLAN: ****  Return to clinic in ***  ------------------------------------------------------------- History of present illness: ***  Out-side paper records, electronic medical record, and images have been reviewed where available and summarized as: *** Lab Results  Component Value Date   HGBA1C 6.1 05/14/2021   Lab Results  Component Value Date   TXMIWOEH21 224 01/14/2022   Lab Results  Component Value Date   TSH 2.20 01/14/2022   Lab Results  Component Value Date   ESRSEDRATE 69 (H) 09/01/2018    Past Medical History:  Diagnosis Date   Allergic rhinitis    Allergic rhinitis 01/25/2009        ANEMIA-IRON DEFICIENCY 01/25/2009   Anxiety    Backache 01/25/2009   Qualifier: Diagnosis of  By: Jenny Reichmann MD, Hunt Oris    Bipolar 1 disorder [facility])    Chronic pain syndrome    Complete rotator cuff tear 07/21/2013   Complete tear of right rotator cuff 07/21/2013   Contact lens/glasses fitting    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   [place] DISEASE, LUMBAR 01/25/2009   Essential hypertension 82/06/35   On Bystolic    GAD (generalized anxiety disorder) 12/28/2013   GERD (gastroesophageal reflux disease)    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   Headache, acute 12/20/2013   Hx of laparoscopic gastric banding 09/03/2010   Surgery date: 07/17/10    HYPERLIPIDEMIA 01/25/2009   Hyperlipidemia with target LDL less than 130  01/25/2009   HYPERTENSION 01/25/2009   Hypokalemia, inadequate intake 09/01/2011   Insomnia 04/04/2011   INSOMNIA-SLEEP DISORDER-UNSPEC 04/30/2009   Qualifier: Diagnosis of  By: Jenny Reichmann MD, Hunt Oris    Iron deficiency anemia 01/25/2009        Labyrinthitis 02/19/2014   Leiomyoma of uterus, unspecified 08/13/2008   Overview:  Leiomyoma Of The Uterus  10/1 IMO update   Localized edema 06/12/2015   MANIC DEPRESSIVE ILLNESS 01/25/2009   pt is unsue if this is her specifc dx   Mixed bipolar I disorder ([place]) 07/12/2012   Morbid obesity ([place]) 01/25/2009   Night sweats    Osteopenia determined by x-ray 06/13/2021   Other abnormal glucose 10/26/2012   Palpitations 10/25/2013   PEPTIC ULCER DISEASE, HELICOBACTER PYLORI POSITIVE 10/03/2009   Peripheral edema 06/25/2015   Pernicious anemia 03/28/2012   Piriformis syndrome of both sides 09/02/2018   PUD (peptic ulcer disease) 10/03/2009        Right shoulder pain 05/03/2013   Dg Shoulder Right  05/03/2013   CLINICAL DATA Pain.  EXAM RIGHT SHOULDER - 2+ VIEW  COMPARISON None.  FINDINGS Acromioclavicular and glenohumeral degenerative change present. Questionable calcific density noted in the region of the supraspinatus space, possibly representing calcific supraspinatus tendinitis. This could represent a sclerotic density in the acromion. MRI of the right shoulder suggest   Sleep apnea 08/12/2016   SMOKER 01/25/2009   Qualifier: Diagnosis of  By: Jenny Reichmann MD, Hunt Oris    Subacromial bursitis  05/17/2013   SUBSTANCE ABUSE 11/06/2009        Thiamin deficiency 10/30/2012   Tobacco abuse 06/09/2010   Visual disturbance 05/03/2013   Vitamin D deficiency 10/27/2012    Past Surgical History:  Procedure Laterality Date   ABDOMINAL HYSTERECTOMY     BLADDER SURGERY     s/p with ?diverticulitis   HAND TENDON SURGERY  1991   s/p-Right-index and middle   LAPAROSCOPIC GASTRIC BAND REMOVAL WITH LAPAROSCOPIC GASTRIC SLEEVE RESECTION N/A 08/17/2016   Procedure: LAPAROSCOPIC GASTRIC BAND REMOVAL  WITH LAPAROSCOPIC GASTRIC SLEEVE RESECTION WITH UPPER ENDO;  Surgeon: Alphonsa Overall, MD;  Location: [facility];  Service: General;  Laterality: N/A;   LAPAROSCOPIC GASTRIC BANDING  07/14/10   SHOULDER ARTHROSCOPY Right 07/21/2013   Procedure: RIGHT ARTHROSCOPY SHOULDER DEBRIDMENT EXTENTSIVE,ARTHROSCOPIC REMOVE LOOSE FOREIGN BODY, BICEPS TENOLYSIS ;  Surgeon: Johnny Bridge, MD;  Location: [place];  Service: Orthopedics;  Laterality: Right;   TUBAL LIGATION       Medications:  Outpatient Encounter Medications as of 02/04/2022  Medication Sig Note   amLODipine (NORVASC) 5 MG tablet Take 1 tablet (5 mg total) by mouth daily. (Patient not taking: Reported on 01/23/2022)    ARIPiprazole (ABILIFY) 5 MG tablet Take 1 tablet (5 mg total) by mouth daily.    atorvastatin (LIPITOR) 20 MG tablet Take 1 tablet (20 mg total) by mouth daily. (Patient not taking: Reported on 01/23/2022) 01/23/2022: Reports not taking consistently   clonazePAM (KLONOPIN) 0.5 MG tablet Take 1 tablet (0.5 mg total) by mouth 3 (three) times daily as needed for anxiety.    cyclobenzaprine (FLEXERIL) 10 MG tablet Take 1 tablet (10 mg total) by mouth at bedtime as needed for muscle spasms. 01/23/2022: Reports not taking d/t needing new order   FLUoxetine (PROZAC) 20 MG capsule Take 1 capsule (20 mg total) by mouth daily.    gabapentin (NEURONTIN) 100 MG capsule Take 1 capsule (100 mg total) by mouth 3 (three) times daily.    meloxicam (MOBIC) 15 MG tablet Take 1 tablet (15 mg total) by mouth daily as needed for pain.    Multiple Vitamins-Minerals (BARIATRIC MULTIVITAMINS/IRON) CAPS Take 1 capsule by mouth daily.    pantoprazole (PROTONIX) 40 MG tablet Take 1 tablet (40 mg total) by mouth 2 (two) times daily before a meal.    QUEtiapine (SEROQUEL) 25 MG tablet Take 1 tablet (25 mg total) by mouth 2 (two) times daily.    QUEtiapine (SEROQUEL) 300 MG tablet Take 1 tablet (300 mg total) by mouth at bedtime.    tiZANidine  (ZANAFLEX) 4 MG tablet Take 1 tablet (4 mg total) by mouth at bedtime.    No facility-administered encounter medications on file as of 02/04/2022.    Allergies:  Allergies  Allergen Reactions   Lamictal [Lamotrigine] Rash    Family History: Family History  Problem Relation Age of Onset   Hypertension Mother    Asthma Mother    Stroke Father    Cirrhosis Father        ETOH   Hypertension Father    Diabetes Father    Alcohol abuse Father    Hypertension Sister    Asthma Sister    Hypertension Brother    ADD / ADHD Son    Hypertension Paternal Aunt    Diabetes Maternal Grandmother    ADD / ADHD Other        3 nephews, also emotional issues   Diabetes Other  Uncle   Diabetes Other        Aunt   Suicidality Neg Hx     Social History: Social History   Tobacco Use   Smoking status: Every Day    Packs/day: 0.25    Years: 20.00    Total pack years: 5.00    Types: Cigarettes   Smokeless tobacco: Never   Tobacco comments:    States has cut back to 5-7 a day and working on this.   Vaping Use   Vaping Use: Never used  Substance Use Topics   Alcohol use: No    Alcohol/week: 0.0 standard drinks of alcohol   Drug use: No   Social History   Social History Narrative   Moved from [place] then [place] to [place] 2009   2 children-1 boy, 1 girl   Disabled-bipolar   Daily Caffeine Use-1 cup/day    Vital Signs:  There were no vitals taken for this visit.   General Medical Exam:  *** General:  Well appearing, comfortable.   Eyes/ENT: see cranial nerve examination.   Neck:   No carotid bruits. Respiratory:  Clear to auscultation, good air entry bilaterally.   Cardiac:  Regular rate and rhythm, no murmur.   Extremities:  No deformities, edema, or skin discoloration.  Skin:  No rashes or lesions.  Neurological Exam: MENTAL STATUS including orientation to time, place, person, recent and remote memory, attention span and concentration, language, and fund of  knowledge is ***normal.  Speech is not dysarthric.  CRANIAL NERVES: II:  No visual field defects.  Unremarkable fundi.   III-IV-VI: Pupils equal round and reactive to light.  Normal conjugate, extra-ocular eye movements in all directions of gaze.  No nystagmus.  No ptosis***.   V:  Normal facial sensation.    VII:  Normal facial symmetry and movements.   VIII:  Normal hearing and vestibular function.   IX-X:  Normal palatal movement.   XI:  Normal shoulder shrug and head rotation.   XII:  Normal tongue strength and range of motion, no deviation or fasciculation.  MOTOR:  No atrophy, fasciculations or abnormal movements.  No pronator drift.   Upper Extremity:  Right  Left  Deltoid  5/5   5/5   Biceps  5/5   5/5   Triceps  5/5   5/5   Infraspinatus 5/5  5/5  Medial pectoralis 5/5  5/5  Wrist extensors  5/5   5/5   Wrist flexors  5/5   5/5   Finger extensors  5/5   5/5   Finger flexors  5/5   5/5   Dorsal interossei  5/5   5/5   Abductor pollicis  5/5   5/5   Tone (Ashworth scale)  0  0   Lower Extremity:  Right  Left  Hip flexors  5/5   5/5   Hip extensors  5/5   5/5   Adductor 5/5  5/5  Abductor 5/5  5/5  Knee flexors  5/5   5/5   Knee extensors  5/5   5/5   Dorsiflexors  5/5   5/5   Plantarflexors  5/5   5/5   Toe extensors  5/5   5/5   Toe flexors  5/5   5/5   Tone (Ashworth scale)  0  0   MSRs:  Right        Left  brachioradialis 2+  2+  biceps 2+  2+  triceps 2+  2+  patellar 2+  2+  ankle jerk 2+  2+  Hoffman no  no  plantar response down  down   SENSORY:  Normal and symmetric perception of light touch, pinprick, vibration, and proprioception.  Romberg's sign absent.   COORDINATION/GAIT: Normal finger-to- nose-finger and heel-to-shin.  Intact rapid alternating movements bilaterally.  Able to rise from a chair without using arms.  Gait narrow based and stable. Tandem and stressed gait intact.    ***   Thank you for allowing me to  participate in patient's care.  If I can answer any additional questions, I would be pleased to do so.    Sincerely,    Naavya Postma K. Posey Pronto, DO

## 2022-02-04 ENCOUNTER — Ambulatory Visit: Payer: Medicare Other | Admitting: Neurology

## 2022-02-05 ENCOUNTER — Ambulatory Visit: Payer: Medicare Other | Admitting: Physical Therapy

## 2022-02-09 ENCOUNTER — Other Ambulatory Visit: Payer: Self-pay | Admitting: Vascular Surgery

## 2022-02-09 DIAGNOSIS — R600 Localized edema: Secondary | ICD-10-CM

## 2022-02-10 ENCOUNTER — Ambulatory Visit: Payer: Self-pay | Admitting: Licensed Clinical Social Worker

## 2022-02-10 NOTE — Patient Instructions (Signed)
Visit Information  Thank you for taking time to visit with me today. Please don't hesitate to contact me if I can be of assistance to you before our next scheduled telephone appointment.  Following are the goals we discussed today:   Our next appointment is by telephone on 02/25/22 at 9:30 AM   Please call the care guide team at [phone] if you need to cancel or reschedule your appointment.   If you are experiencing a Mental Health or [place] or need someone to talk to, please go to [facility] [place] [phone])   Following is a copy of your full plan of care:   Interventions:  LCSW received phone call today from [place], representative from [place] was [employer].  LCSW talked with Patricia Nettle about client needs. Nichole Draper informed LCSW that she had gone 2 times to home of client and knocked on door with no response from client. Patricia Nettle said she left APS information on door knob of client on flyer two times and that Nichole had not heard back from client.  LCSW talked with Patricia Nettle about client needs and marital situation. LCSW talked with Patricia Nettle about condominium situation of client and client difficulty in paying rent at [employer] gave LCSW her name as APS representative and asked for client to call her at [phone] (if LCSW was able to reach client by phone)  LCSW called client today and spoke via phone with client today LCSW talked with client about her needs LCSW informed Kada that Patricia Nettle of APS had visited twice to her condominium and no one had come to door to meet her. LCSW informed client that Patricia Nettle had left APS contact information 2 times on doorknob of client condominium LCSW gave client name and phone number of Patricia Nettle today.  Client said she would call Patricia Nettle with APS today. LCSW reminded client of program  support with RN Donald Siva  Ms. Nesmith was given information about Care Management services by the embedded care coordination team including:  Care Management services include personalized support from designated clinical staff supervised by her physician, including individualized plan of care and coordination with other care providers 24/7 contact phone numbers for assistance for urgent and routine care needs. The patient may stop CCM services at any time (effective at the end of the month) by phone call to the office staff.  Patient agreed to services and verbal consent obtained.   Norva Riffle.Mariyanna Mucha MSW, [place] Holiday representative [facility] Care Management [phone]

## 2022-02-10 NOTE — Patient Outreach (Signed)
  Care Coordination   Follow Up Visit Note   02/10/2022 Name: Shannon [name] MRN: [id number] DOB: 09-14-1965  Shannon [name] is a 56 y.o. year old female who sees [place], [place], NP-C for primary care. I spoke with  [name] by phone today.  LCSW also spoke via phone today with Patricia Nettle, [place] representative  What matters to the patients health and wellness today?  Patient is stressed since her husband has left and he was providing most of her care.    Goals Addressed               This Visit's Progress     patient is stressed since her husband has left and he was providing most of her care (pt-stated)        Interventions:   LCSW received phone call today from [place], representative from [place] was [employer].  LCSW talked with Patricia Nettle about client needs. Nichole Draper informed LCSW that she had gone 2 times to home of client and knocked on door with no response from client. Patricia Nettle said she left APS information on door knob of client on flyer two times and that Nichole had not heard back from client.  LCSW talked with Patricia Nettle about client needs and marital situation. LCSW talked with Patricia Nettle about condominium situation of client and client difficulty in paying rent at [employer] gave LCSW her name as APS representative and asked for client to call her at [phone] (if LCSW was able to reach client by phone)  LCSW called client today and spoke via phone with client today LCSW talked with client about her needs LCSW informed Kortni that Patricia Nettle of APS had visited twice to her condominium and no one had come to door to meet her. LCSW informed client that Patricia Nettle had left APS contact information 2 times on doorknob of client condominium LCSW gave client name and phone number of Patricia Nettle today.  Client said she would call Patricia Nettle with APS today. LCSW reminded client of  program support with RN Donald Siva     SDOH assessments and interventions completed:  Yes  SDOH Interventions Today    Flowsheet Row Most Recent Value  SDOH Interventions   Physical Activity Interventions Other (Comments)  [client uses a rollator walker to help her ambulate]  Stress Interventions Provide Counseling  [client has stress related to managing daily care needs]        Care Coordination Interventions:  Yes, provided   Follow up plan: Follow up call scheduled for 02/25/22 at 9:30 AM    Encounter Outcome:  Pt. Visit Completed   Shannon [name].Shannon [name] MSW, [place] Holiday representative [facility] Care Management [phone]

## 2022-02-11 NOTE — Progress Notes (Signed)
  Care Coordination   Note   02/11/2022 Name: Shannon [name] MRN: [id number] DOB: 10/11/1965  Shannon [name] is a 56 y.o. year old female who sees [place], [place], NP-C for primary care. I reached out to [name] by phone today to offer care coordination services.  Received referral   [name] was given information about Care Coordination services today including:   The Care Coordination services include support from the care team which includes your Nurse Coordinator, Clinical Social Worker, or Pharmacist.  The Care Coordination team is here to help remove barriers to the health concerns and goals most important to you. Care Coordination services are voluntary, and the patient may decline or stop services at any time by request to their care team member.   Care Coordination Consent Status: Patient agreed to services and verbal consent obtained.   Follow up plan:  Telephone appointment with care coordination team member scheduled for:  02/26/2022  Encounter Outcome:  Pt. Scheduled from referral   Julian Hy, [place] Direct Dial: [phone]

## 2022-02-12 ENCOUNTER — Ambulatory Visit (INDEPENDENT_AMBULATORY_CARE_PROVIDER_SITE_OTHER): Payer: Medicare Other | Admitting: Internal Medicine

## 2022-02-12 ENCOUNTER — Encounter: Payer: Self-pay | Admitting: Internal Medicine

## 2022-02-12 VITALS — BP 130/80 | HR 122 | Ht 64.0 in | Wt 241.5 lb

## 2022-02-12 DIAGNOSIS — K5900 Constipation, unspecified: Secondary | ICD-10-CM

## 2022-02-12 DIAGNOSIS — R634 Abnormal weight loss: Secondary | ICD-10-CM | POA: Diagnosis not present

## 2022-02-12 DIAGNOSIS — K219 Gastro-esophageal reflux disease without esophagitis: Secondary | ICD-10-CM

## 2022-02-12 DIAGNOSIS — R1013 Epigastric pain: Secondary | ICD-10-CM | POA: Diagnosis not present

## 2022-02-12 DIAGNOSIS — K449 Diaphragmatic hernia without obstruction or gangrene: Secondary | ICD-10-CM | POA: Diagnosis not present

## 2022-02-12 DIAGNOSIS — R1310 Dysphagia, unspecified: Secondary | ICD-10-CM | POA: Diagnosis not present

## 2022-02-12 MED ORDER — NA SULFATE-K SULFATE-MG SULF 17.5-3.13-1.6 GM/177ML PO SOLN: 1.0000 | Freq: Once | ORAL | 0 refills | Status: AC

## 2022-02-12 MED ORDER — LINACLOTIDE 145 MCG PO CAPS: 145.0000 ug | ORAL_CAPSULE | Freq: Every day | ORAL | 2 refills | Status: DC

## 2022-02-12 NOTE — Patient Instructions (Addendum)
We have sent the following medications to your pharmacy for you to pick up at your convenience: Linzess 171mg, try the samples provided today before picking up your RX  We are providing you with a GERD handout to read and follow.  Drink 8 cups of water a day and walk 30 minutes a day.  Please purchase the following medications over the counter and take as directed: Fiber supplement such as Benefiber- use as directed daily appreciate the opportunity to care for you.   YDayna BarkerMD

## 2022-02-12 NOTE — Progress Notes (Signed)
Chief Complaint: Abdominal pain  HPI : 56 year old female with history of OSA, bipolar disorder, hiatal hernia, chronic pain syndrome, GERD, anxiety, obesity s/p lap band surgery s/p revision with gastric sleeve presents with abdominal pain  About a month ago she went to the ED for lightheadedness. She was complaining about some ab pain at that time. She was told that she has a hiatal hernia based upon a CT scan that was done. She has felt a hardness in her upper abdomen for 3 months. Her ab pain is mostly located in the same area. Endorses chest burning and regurgitation for which she takes pantoprazole 40 mg BID, which typically keeps her symptoms under control. Denies N&V. Sometimes she has trouble swallowing in her throat area. When she tries to swallow solids, she will need liquids to help the solids go down. She has lost a lot of weight with about 40 lbs of weight loss over the last 2 months. The weight loss was unintentional. She has to take an OTC laxative such as Miralax in order to have a BM. She has one BM once every 3-4 weeks. Denies blood in the stools. She feels like all foods taste very salty. Denies prior EGD or colonoscopy. Denies family history of GI issues. Denies alcohol use.  Wt Readings from Last 3 Encounters:  02/12/22 241 lb 8 oz (109.5 kg)  12/25/21 282 lb (127.9 kg)  06/17/21 287 lb (130.2 kg)   Past Medical History:  Diagnosis Date   Allergic rhinitis    Allergic rhinitis 01/25/2009        ANEMIA-IRON DEFICIENCY 01/25/2009   Anxiety    Backache 01/25/2009   Qualifier: Diagnosis of  By: Jenny Reichmann MD, Hunt Oris    Bipolar 1 disorder [facility])    Chronic pain syndrome    Complete rotator cuff tear 07/21/2013   Complete tear of right rotator cuff 07/21/2013   Contact lens/glasses fitting    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   DISC DISEASE, LUMBAR 01/25/2009   Essential hypertension 85/07/3147   On Bystolic    GAD (generalized anxiety disorder) 12/28/2013   GERD  (gastroesophageal reflux disease)    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   Headache, acute 12/20/2013   Hx of laparoscopic gastric banding 09/03/2010   Surgery date: 07/17/10    HYPERLIPIDEMIA 01/25/2009   Hyperlipidemia with target LDL less than 130 01/25/2009   HYPERTENSION 01/25/2009   Hypokalemia, inadequate intake 09/01/2011   Insomnia 04/04/2011   INSOMNIA-SLEEP DISORDER-UNSPEC 04/30/2009   Qualifier: Diagnosis of  By: Jenny Reichmann MD, Hunt Oris    Iron deficiency anemia 01/25/2009        Labyrinthitis 02/19/2014   Leiomyoma of uterus, unspecified 08/13/2008   Overview:  Leiomyoma Of The Uterus  10/1 IMO update   Localized edema 06/12/2015   MANIC DEPRESSIVE ILLNESS 01/25/2009   pt is unsue if this is her specifc dx   Mixed bipolar I disorder ([place]) 07/12/2012   Morbid obesity ([place]) 01/25/2009   Night sweats    Osteopenia determined by x-ray 06/13/2021   Other abnormal glucose 10/26/2012   Palpitations 10/25/2013   PEPTIC ULCER DISEASE, HELICOBACTER PYLORI POSITIVE 10/03/2009   Peripheral edema 06/25/2015   Pernicious anemia 03/28/2012   Piriformis syndrome of both sides 09/02/2018   PUD (peptic ulcer disease) 10/03/2009        Right shoulder pain 05/03/2013   Dg Shoulder Right  05/03/2013   CLINICAL DATA Pain.  EXAM RIGHT SHOULDER - 2+ VIEW  COMPARISON None.  FINDINGS Acromioclavicular and glenohumeral degenerative change present. Questionable calcific density noted in the region of the supraspinatus space, possibly representing calcific supraspinatus tendinitis. This could represent a sclerotic density in the acromion. MRI of the right shoulder suggest   Sleep apnea 08/12/2016   SMOKER 01/25/2009   Qualifier: Diagnosis of  By: Jenny Reichmann MD, Hunt Oris    Subacromial bursitis 05/17/2013   SUBSTANCE ABUSE 11/06/2009        Thiamin deficiency 10/30/2012   Tobacco abuse 06/09/2010   Visual disturbance 05/03/2013   Vitamin D deficiency 10/27/2012     Past Surgical History:  Procedure Laterality Date   ABDOMINAL HYSTERECTOMY      BLADDER SURGERY     s/p with ?diverticulitis   HAND TENDON SURGERY  1991   s/p-Right-index and middle   LAPAROSCOPIC GASTRIC BAND REMOVAL WITH LAPAROSCOPIC GASTRIC SLEEVE RESECTION N/A 08/17/2016   Procedure: LAPAROSCOPIC GASTRIC BAND REMOVAL WITH LAPAROSCOPIC GASTRIC SLEEVE RESECTION WITH UPPER ENDO;  Surgeon: Alphonsa Overall, MD;  Location: [facility];  Service: General;  Laterality: N/A;   LAPAROSCOPIC GASTRIC BANDING  07/14/10   SHOULDER ARTHROSCOPY Right 07/21/2013   Procedure: RIGHT ARTHROSCOPY SHOULDER DEBRIDMENT EXTENTSIVE,ARTHROSCOPIC REMOVE LOOSE FOREIGN BODY, BICEPS TENOLYSIS ;  Surgeon: Johnny Bridge, MD;  Location: [facility];  Service: Orthopedics;  Laterality: Right;   TUBAL LIGATION     Family History  Problem Relation Age of Onset   Hypertension Mother    Asthma Mother    Stroke Father    Cirrhosis Father        ETOH   Hypertension Father    Diabetes Father    Alcohol abuse Father    Hypertension Sister    Asthma Sister    Hypertension Brother    ADD / ADHD Son    Hypertension Paternal Aunt    Diabetes Maternal Grandmother    ADD / ADHD Other        3 nephews, also emotional issues   Diabetes Other        Uncle   Diabetes Other        Aunt   Suicidality Neg Hx    Social History   Tobacco Use   Smoking status: Every Day    Packs/day: 0.25    Years: 20.00    Total pack years: 5.00    Types: Cigarettes   Smokeless tobacco: Never   Tobacco comments:    States has cut back to 5-7 a day and working on this.   Vaping Use   Vaping Use: Never used  Substance Use Topics   Alcohol use: No    Alcohol/week: 0.0 standard drinks of alcohol   Drug use: No   Current Outpatient Medications  Medication Sig Dispense Refill   amLODipine (NORVASC) 5 MG tablet Take 1 tablet (5 mg total) by mouth daily. 30 tablet 2   ARIPiprazole (ABILIFY) 5 MG tablet Take 1 tablet (5 mg total) by mouth daily. 30 tablet 1   atorvastatin (LIPITOR) 20 MG tablet Take 1 tablet  (20 mg total) by mouth daily. 90 tablet 1   clonazePAM (KLONOPIN) 0.5 MG tablet Take 1 tablet (0.5 mg total) by mouth 3 (three) times daily as needed for anxiety. 75 tablet 2   cyclobenzaprine (FLEXERIL) 10 MG tablet Take 1 tablet (10 mg total) by mouth at bedtime as needed for muscle spasms. 15 tablet 0   FLUoxetine (PROZAC) 20 MG capsule Take 1 capsule (20 mg total) by mouth daily. 30 capsule 1   gabapentin (  NEURONTIN) 100 MG capsule Take 1 capsule (100 mg total) by mouth 3 (three) times daily. 90 capsule 0   meloxicam (MOBIC) 15 MG tablet Take 1 tablet (15 mg total) by mouth daily as needed for pain. 14 tablet 0   Multiple Vitamins-Minerals (BARIATRIC MULTIVITAMINS/IRON) CAPS Take 1 capsule by mouth daily. 30 capsule 2   pantoprazole (PROTONIX) 40 MG tablet Take 1 tablet (40 mg total) by mouth 2 (two) times daily before a meal. 180 tablet 0   QUEtiapine (SEROQUEL) 25 MG tablet Take 1 tablet (25 mg total) by mouth 2 (two) times daily. 60 tablet 1   QUEtiapine (SEROQUEL) 300 MG tablet Take 1 tablet (300 mg total) by mouth at bedtime. 30 tablet 1   tiZANidine (ZANAFLEX) 4 MG tablet Take 1 tablet (4 mg total) by mouth at bedtime. 14 tablet 0   No current facility-administered medications for this visit.   Allergies  Allergen Reactions   Lamictal [Lamotrigine] Rash     Review of Systems: All systems reviewed and negative except where noted in HPI.   Physical Exam: BP 130/80   Pulse (!) 122   Ht _0  (1.626 m)   Wt 241 lb 8 oz (109.5 kg)   BMI 41.45 kg/m  Constitutional: Pleasant,well-developed, female in no acute distress. HEENT: Normocephalic and atraumatic. Conjunctivae are normal. No scleral icterus. Cardiovascular: Normal rate, regular rhythm.  Pulmonary/chest: Effort normal and breath sounds normal. No wheezing, rales or rhonchi. Abdominal: Soft, nondistended, tender in the epigastric area. Bowel sounds active throughout. There are no masses palpable. No  hepatomegaly. Extremities: No edema Neurological: Alert and oriented to person place and time. Skin: Skin is warm and dry. No rashes noted. Psychiatric: Normal mood and affect. Behavior is normal.  Labs 12/2021: CBC unremarkable. CMP with low sodium of 132 and elevated alk phos of 118. TSH nml. Mg nml at 1.6. Vit B12 nml. Low folate level of 5.5. Low vitamin B1 level of 42.3.   Upper GI w/KUB 07/02/16: IMPRESSION: 1. Small type 1 hiatal hernia was transient. Some initial images raised concern for a small ulceration or heat mucosa along the margin of this hiatal hernia, but subsequent images showed a smooth appearance and accordingly this may have simply been due to contraction or rugal fold. 2. No complicating feature related to the lap band is identified. No other abnormal morphologic findings  RUQ U/S 07/02/16: IMPRESSION: 1. No acute findings. Normal appearance of the gallbladder. No bile duct dilation. 2. Small hepatic cyst.  Ab X-ray 12/25/21: IMPRESSION: Negative abdominal radiographs. No acute cardiopulmonary disease. Moderate stool burden  CT A/P w/contrast 12/25/21: IMPRESSION: 1. No acute localizing process in the abdomen or pelvis. 2. Trace free fluid in the pelvis is likely physiologic. 3. Small hiatal hernia.  Postsurgical changes in the stomach. 4. 2 mm left solid pulmonary nodules. Per Fleischner Society Guidelines, no routine follow-up imaging is recommended. These guidelines do not apply to immunocompromised patients and patients with cancer. Follow up in patients with significant comorbidities as clinically warranted. For lung cancer screening, adhere to Lung-RADS guidelines. Reference: Radiology. 2017; 284(1):228-43.  ASSESSMENT AND PLAN: Epigastric ab pain GERD  Hiatal hernia Dysphagia Constipation Weight loss  Patient presents with epigastric ab pain over the last 3 months. This could be related to constipation, GERD, or PUD. Patient does have prior  history of lap band surgery s/p subsequent revision and gastric sleeve surgery so she may be at risk for a marginal ulcer. Patient also has a hiatal hernia so  she may have Cameron's lesions or esophagitis. Will plan for EGD for further evaluation of her epigastric ab pain and dysphagia. Patient endorses about 40 lbs of weight loss over the last 2 months so will also plan to add on a colonoscopy for further evaluation.  - GERD handout - Drink 8 cups of water per day, walk 30 min per day, daily fiber supplement - Start Linzess 145 mcg QD - EGD/colonoscopy [facility] with 2-day preparation  Christia Reading, MD  I spent 50 minutes of time, including in depth chart review, independent review of results as outlined above, communicating results with the patient directly, face-to-face time with the patient, coordinating care, ordering studies and medications as appropriate, and documentation.

## 2022-02-17 ENCOUNTER — Ambulatory Visit: Payer: Self-pay

## 2022-02-17 DIAGNOSIS — F3160 Bipolar disorder, current episode mixed, unspecified: Secondary | ICD-10-CM

## 2022-02-18 ENCOUNTER — Telehealth: Payer: Self-pay

## 2022-02-18 ENCOUNTER — Emergency Department (HOSPITAL_COMMUNITY)
Admission: EM | Admit: 2022-02-18 | Discharge: 2022-02-19 | Discharge status: Against Medical Advice | Payer: Medicare Other | Attending: Emergency Medicine | Admitting: Emergency Medicine

## 2022-02-18 ENCOUNTER — Telehealth: Payer: Medicare Other

## 2022-02-18 ENCOUNTER — Ambulatory Visit: Payer: Self-pay

## 2022-02-18 ENCOUNTER — Emergency Department (HOSPITAL_COMMUNITY): Payer: Medicare Other

## 2022-02-18 ENCOUNTER — Other Ambulatory Visit: Payer: Self-pay

## 2022-02-18 ENCOUNTER — Ambulatory Visit: Payer: Self-pay | Admitting: Licensed Clinical Social Worker

## 2022-02-18 DIAGNOSIS — R6889 Other general symptoms and signs: Secondary | ICD-10-CM | POA: Diagnosis not present

## 2022-02-18 DIAGNOSIS — R55 Syncope and collapse: Secondary | ICD-10-CM | POA: Diagnosis not present

## 2022-02-18 DIAGNOSIS — Z5321 Procedure and treatment not carried out due to patient leaving prior to being seen by health care provider: Secondary | ICD-10-CM | POA: Insufficient documentation

## 2022-02-18 DIAGNOSIS — M25519 Pain in unspecified shoulder: Secondary | ICD-10-CM | POA: Diagnosis not present

## 2022-02-18 DIAGNOSIS — I10 Essential (primary) hypertension: Secondary | ICD-10-CM

## 2022-02-18 DIAGNOSIS — R202 Paresthesia of skin: Secondary | ICD-10-CM | POA: Diagnosis not present

## 2022-02-18 DIAGNOSIS — Z043 Encounter for examination and observation following other accident: Secondary | ICD-10-CM | POA: Diagnosis not present

## 2022-02-18 DIAGNOSIS — Z743 Need for continuous supervision: Secondary | ICD-10-CM | POA: Diagnosis not present

## 2022-02-18 DIAGNOSIS — R262 Difficulty in walking, not elsewhere classified: Secondary | ICD-10-CM

## 2022-02-18 LAB — COMPREHENSIVE METABOLIC PANEL
ALT: 16 U/L (ref 0–44)
AST: 23 U/L (ref 15–41)
Albumin: 3.3 g/dL — ABNORMAL LOW (ref 3.5–5.0)
Alkaline Phosphatase: 103 U/L (ref 38–126)
Anion gap: 17 — ABNORMAL HIGH (ref 5–15)
BUN: 8 mg/dL (ref 6–20)
CO2: 21 mmol/L — ABNORMAL LOW (ref 22–32)
Calcium: 8.2 mg/dL — ABNORMAL LOW (ref 8.9–10.3)
Chloride: 99 mmol/L (ref 98–111)
Creatinine, Ser: 1.2 mg/dL — ABNORMAL HIGH (ref 0.44–1.00)
GFR, Estimated: 53 mL/min — ABNORMAL LOW (ref >60)
Glucose, Bld: 116 mg/dL — ABNORMAL HIGH (ref 70–99)
Potassium: 3 mmol/L — ABNORMAL LOW (ref 3.5–5.1)
Sodium: 137 mmol/L (ref 135–145)
Total Bilirubin: 1.2 mg/dL (ref 0.3–1.2)
Total Protein: 7.9 g/dL (ref 6.5–8.1)

## 2022-02-18 LAB — CBC WITH DIFFERENTIAL/PLATELET
Abs Immature Granulocytes: 0.03 10*3/uL (ref 0.00–0.07)
Basophils Absolute: 0 10*3/uL (ref 0.0–0.1)
Basophils Relative: 1 %
Eosinophils Absolute: 0.1 10*3/uL (ref 0.0–0.5)
Eosinophils Relative: 1 %
HCT: 41.9 % (ref 36.0–46.0)
Hemoglobin: 13.5 g/dL (ref 12.0–15.0)
Immature Granulocytes: 1 %
Lymphocytes Relative: 23 %
Lymphs Abs: 1.5 10*3/uL (ref 0.7–4.0)
MCH: 24.2 pg — ABNORMAL LOW (ref 26.0–34.0)
MCHC: 32.2 g/dL (ref 30.0–36.0)
MCV: 75 fL — ABNORMAL LOW (ref 80.0–100.0)
Monocytes Absolute: 0.4 10*3/uL (ref 0.1–1.0)
Monocytes Relative: 7 %
Neutro Abs: 4.5 10*3/uL (ref 1.7–7.7)
Neutrophils Relative %: 67 %
Platelets: 291 10*3/uL (ref 150–400)
RBC: 5.59 MIL/uL — ABNORMAL HIGH (ref 3.87–5.11)
RDW: 19.6 % — ABNORMAL HIGH (ref 11.5–15.5)
WBC: 6.5 10*3/uL (ref 4.0–10.5)
nRBC: 0 % (ref 0.0–0.2)

## 2022-02-18 LAB — MAGNESIUM: Magnesium: 1.5 mg/dL — ABNORMAL LOW (ref 1.7–2.4)

## 2022-02-18 NOTE — ED Triage Notes (Addendum)
Pt via EMS from home c/o paresthesia in bilateral arms and legs and face. Recent dx neuropathy. She tried to get an appointment for evaluation but was not successful so she called EMS. She is trying to get a first floor apartment for accessibility because she is having difficulty standing and walking as well as navigating stairs. She is also trying to connect with a GI provider due to 41 lb weight loss in 3 months. [place] disease management contacted pt to set up a call for today but pt was unable to wait due to her discomfort.   BP 150/92 HR 120 O2 97% RA CBG 103  A/O x 4, ambulatory on scene.

## 2022-02-18 NOTE — ED Provider Triage Note (Signed)
Emergency Medicine Provider Triage Evaluation Note  Shannon [name] , a 56 y.o. female  was evaluated in triage.  Pt complains of paresthesias.  She also reports she had a fall Thursday and has pain over her sacrum.  She states that she did not have a syncopal episode.  She states her legs gave out from underneath her.  Denies loss of consciousness following the fall.  Reports similar episodes in the past.  Review of Systems  Positive: As above Negative: As above  Physical Exam  There were no vitals taken for this visit. Gen:   Awake, no distress   Resp:  Normal effort  MSK:   Moves extremities without difficulty  Other:  Good range of motion in all extremities.  Medical Decision Making  Medically screening exam initiated at 4:47 PM.  Appropriate orders placed.  Shannon [name] was informed that the remainder of the evaluation will be completed by another provider, this initial triage assessment does not replace that evaluation, and the importance of remaining in the ED until their evaluation is complete.     Evlyn Courier, PA-C 02/18/22 [phone]

## 2022-02-18 NOTE — Chronic Care Management (AMB) (Signed)
  Chronic Care Management   CCM RN Visit Note  02/18/2022 Name: Shannon [name] MRN: [id number] DOB: 15-Nov-1965  Subjective: Shannon [name] is a 56 y.o. year old female who is a primary care patient of [name], [name], NP-C. The patient was referred to the Chronic Care Management team for assistance with care management needs subsequent to provider initiation of CCM services and plan of care.    Today's Visit:  Engaged with patient by telephone for follow up visit.     Shannon [name] returned call. Reports not feeling well and experiencing severe pain. Unable to ambulate down the stairs.   Paramedic team was notified. Reports Shannon [name] will be transported to [facility].   PLAN: Patient in route to [facility] via ambulance. Follow up pending discharge and disposition   Horris Latino RN Care Manager/Chronic Care Management [phone]

## 2022-02-18 NOTE — Telephone Encounter (Signed)
   CCM RN Visit Note   02/18/22 Name: JERALDINE PRIMEAU MRN: [id number]      DOB: 04-Jun-1965  Subjective: ROSALYNN SERGENT is a 56 y.o. year old female who is a primary care patient of [name], [name], NP-C. The patient was referred to the Chronic Care Management team for assistance with care management needs subsequent to provider initiation of CCM services and plan of care.      An unsuccessful outreach attempt was made today for a scheduled CCM visit.   PLAN: A HIPAA compliant phone message was left for the patient providing contact information and requesting a return call.  Horris Latino RN Care Manager/Chronic Care Management [phone]

## 2022-02-18 NOTE — Patient Instructions (Signed)
Visit Information  Thank you for taking time to visit with me today. Please don't hesitate to contact me if I can be of assistance to you before our next scheduled telephone appointment.  Following are the goals we discussed today:   Phone call scheduled for client and RN Shannon [name] for today at 4:00 PM  Please call the care guide team at [phone] if you need to cancel or reschedule your appointment.   If you are experiencing a Mental Health or [place] or need someone to talk to, please go to [facility] [place] [phone])   Following is a copy of your full plan of care:   Interventions:  LCSW received phone call today from client. Client was discussing pain issues; she said pain issues were level "12 out of 10 ". She said she takes Gabapentein as prescribed but did not feel as if it was helping very well. She spoke of neuropathy issues, feelings like pins and needles were  sticking her in fingers, legs and feet. She was almost crying in discussing her pain issues.She said she did not have any prescribed pain medications. She said she was fearful of leaving home since she lives in second floor residence and it would be hard for her to go up and down steps at her residence. There is no elevator at her residence. LCSW asked client if APS representative had talked with client . Client said she had talked with APS representative and APS representative was trying to look into ways to help client at this time.  Client seemed glad that she had talked with APS representative LCSW left phone message and teams message for Shannon Siva RN today discussing client needs in messages. Shannon Siva RN is scheduled to have phone call with client at 4:00 PM today.   Provided counseling support for client. She is not currently getting help in any form from her spouse.  She is married and has phone number of spouse but does not  have address of spouse.   Shannon [name] was given information about Care Management services by the embedded care coordination team including:  Care Management services include personalized support from designated clinical staff supervised by her physician, including individualized plan of care and coordination with other care providers 24/7 contact phone numbers for assistance for urgent and routine care needs. The patient may stop CCM services at any time (effective at the end of the month) by phone call to the office staff.  Patient agreed to services and verbal consent obtained.   Norva Riffle.Zema Lizardo MSW, [place] Holiday representative [facility] Care Management [phone]

## 2022-02-18 NOTE — Patient Outreach (Signed)
  Care Coordination   Follow Up Visit Note   02/18/2022 Name: Shannon [name] MRN: [id number] DOB: [birth year]  Shannon [name] is a 56 y.o. year old female who sees [place], [place], NP-C for primary care. I spoke with  [name] by phone today.  What matters to the patients health and wellness today? Client spouse of neuropathy pain issues in her fingers, legs and feet.    Goals Addressed               This Visit's Progress     patient is stressed since her husband has left and he was providing most of her care (pt-stated)        Interventions:  LCSW received phone call today from client. Client was discussing pain issues; she said pain issues were level "12 out of 10 ". She said she takes Gabapentein as prescribed but did not feel as if it was helping very well. She spoke of neuropathy issues, feelings like pins and needles were  sticking her in fingers, legs and feet. She was almost crying in discussing her pain issues.She said she did not have any prescribed pain medications. She said she was fearful of leaving home since she lives in second floor residence and it would be hard for her to go up and down steps at her residence. There is no elevator at her residence. LCSW asked client if APS representative had talked with client . Client said she had talked with APS representative and APS representative was trying to look into ways to help client at this time.  Client seemed glad that she had talked with APS representative LCSW left phone message and teams message for Donald Siva RN today discussing client needs in messages. Donald Siva RN is scheduled to have phone call with client at 4:00 PM today.   Provided counseling support for client. She is not currently getting help in any form from her spouse.  She is married and has phone number of spouse but does not have address of spouse.        SDOH assessments and interventions completed:  Yes  SDOH Interventions Today     Flowsheet Row Most Recent Value  SDOH Interventions   Physical Activity Interventions Other (Comments)  [she uses rollator walker to help her ambulate]  Stress Interventions Provide Counseling  [client is stressed over medical needs and difficulty in completing daily activities]        Care Coordination Interventions:  Yes, provided   Follow up plan: Follow up call scheduled for client with RN Donald Siva today at 4:00 PM     Encounter Outcome:  Pt. Visit Completed   Norva Riffle.Bryndle Corredor MSW, [place] representative [facility] Care Management [phone]

## 2022-02-19 ENCOUNTER — Ambulatory Visit: Payer: Medicare Other

## 2022-02-19 ENCOUNTER — Ambulatory Visit: Payer: Medicare Other | Admitting: Physical Therapy

## 2022-02-19 ENCOUNTER — Other Ambulatory Visit: Payer: Self-pay

## 2022-02-19 ENCOUNTER — Other Ambulatory Visit (HOSPITAL_COMMUNITY): Payer: Self-pay | Admitting: *Deleted

## 2022-02-19 ENCOUNTER — Other Ambulatory Visit: Payer: Medicare Other | Admitting: Pharmacist

## 2022-02-19 ENCOUNTER — Telehealth: Payer: Self-pay

## 2022-02-19 DIAGNOSIS — F3160 Bipolar disorder, current episode mixed, unspecified: Secondary | ICD-10-CM

## 2022-02-19 DIAGNOSIS — R262 Difficulty in walking, not elsewhere classified: Secondary | ICD-10-CM

## 2022-02-19 DIAGNOSIS — F319 Bipolar disorder, unspecified: Secondary | ICD-10-CM

## 2022-02-19 DIAGNOSIS — F4310 Post-traumatic stress disorder, unspecified: Secondary | ICD-10-CM

## 2022-02-19 DIAGNOSIS — I10 Essential (primary) hypertension: Secondary | ICD-10-CM

## 2022-02-19 MED ORDER — CLONAZEPAM 0.5 MG PO TABS: 0.5000 mg | ORAL_TABLET | Freq: Three times a day (TID) | ORAL | 2 refills | Status: DC | PRN

## 2022-02-19 MED ORDER — FLUOXETINE HCL 20 MG PO CAPS: 20.0000 mg | ORAL_CAPSULE | Freq: Every day | ORAL | 1 refills | Status: DC

## 2022-02-19 MED ORDER — QUETIAPINE FUMARATE 25 MG PO TABS: 25.0000 mg | ORAL_TABLET | Freq: Two times a day (BID) | ORAL | 1 refills | Status: DC

## 2022-02-19 MED ORDER — QUETIAPINE FUMARATE 300 MG PO TABS: 300.0000 mg | ORAL_TABLET | Freq: Every day | ORAL | 1 refills | Status: DC

## 2022-02-19 NOTE — Chronic Care Management (AMB) (Signed)
  Chronic Care Management   CCM RN Visit Note  02/19/2022 Name: Shannon [name] MRN: [id number] DOB: 1965-11-29  Subjective: Shannon [name] is a 56 y.o. year old female who is a primary care patient of [name], [name], NP-C. The patient was referred to the Chronic Care Management team for assistance with care management needs subsequent to provider initiation of CCM services and plan of care.    Today's Visit:  Engaged with patient by telephone for follow up visit.      Goals Addressed             This Visit's Progress    Goal: CCM (Mixed Bipolar Disorder) Expected Outcome:  Monitor, Self-Manage And Reduce Symptoms of Mixed Bipolar Disorder       Current Barriers:  Chronic Disease Management support and education needs related to Mixed Bipolar Disorder Lacks caregiver support.  Film/video editor.  Transportation barriers  Planned Interventions: Reviewed plan for treatment of Mixed Bipolar Disorder.  Reviewed recent Emergency Room visit. Paramedics were notified on 02/18/22 due to several hours of severe pain and inability to ambulate down the stairs. Transported to [facility].  Reviewed symptoms. Reports significant fatigue d/t not having required medications. Reports contacting spouse to pick up needed prescriptions but seems doubtful that he will assist. Will update pharmacist regarding options and follow up tomorrow. Previously scheduled physical therapy with the Neurology Rehab team. Ambulation and transportation are current barriers. She will benefit from [facility]. Pending outreach with PCP. Reviewed safety and fall prevention measures. Reviewed plan for assistance with care. Still not receiving assistance with care or financial support from spouse. Reports a friend is currently in the area visiting but unsure how long she will be available. Pending outreach with the Care Coordination SW to discuss options for assistance with SDOH needs. Thorough discussion regarding  worsening s/sx and indications for seeking immediate medical assistance.   Symptom Management: Take medications as prescribed   Attend all scheduled provider appointments Call pharmacy for medication refills 3-7 days in advance of running out of medications Attend church or other social activities Work with the care management team to address care coordination needs and will continue to work with the clinical team to address health care and disease management related needs Call provider office for new concerns or questions  Call the Suicide and Crisis Lifeline: 988 Call the [place] National Suicide Prevention Lifeline: 765 691 3293 or TTY: 629-023-0433 TTY (551) 631-1940) to talk to a trained counselor Call 1-800-273-TALK (toll free, 24 hour hotline) Go to [facility] [address], [place] [phone]) Call 911 if experiencing a Mental Health or [place]   Follow Up Plan:   Will follow up next week             PLAN: Will follow up tomorrow  [place] Manager/Chronic Care Management [phone]

## 2022-02-19 NOTE — Telephone Encounter (Signed)
Called pt and was able to discuss her recent fall last Thursday when her legs gave out on her and she had a bad fall. She was not able to get off the floor so she called the paramedics and they were able to help her. She reports she is still having really bad tingling and numbness in her hands as well as her legs being so weak and so much pain. Her pain is barely managed with the pain medication and she has not been able to keep an appetite so she is worried. I informed her we placed HHPT orders and that we will keep VV for tomorrow for [name] to discuss further. Pt expressed understanding

## 2022-02-19 NOTE — Patient Instructions (Signed)
Shannon [name],   [facility] is going to transfer your prescriptions from [facility]. Please let them know that you need your fluoxetine, quetiapine, and clonazepam.   I will let Harland Dingwall know to send refills on your atorvastatin to [facility].   I will talk to Dr. Lorenso Courier about the Linzess and the colonoscopy prep being too expensive.   Call me with any questions or concerns!  Catie Hedwig Morton, PharmD, [place], [facility] [phone]

## 2022-02-19 NOTE — Telephone Encounter (Signed)
Spoke with the pt and she has stated she is needing further help at home since her dc from the hospital on 02/18/2022. Pt states she went to the ED on 02/19/2027 due to waiting 16 hours. She said she had to double up on her pain meds last night due to the severe pain. Pt would like to speak with Jarrett Soho about at home PT and discuss her pain meds as well.  **I was able to sched a VV for 02/20/2022 at 4pm.

## 2022-02-19 NOTE — Progress Notes (Signed)
--   HHPT ordered  ?

## 2022-02-19 NOTE — Progress Notes (Signed)
02/19/2022 Name: Shannon [name] MRN: [id number] DOB: 03/02/1965  Chief Complaint  Patient presents with   Medication Management    Shannon [name] is a 56 y.o. year old female who presented for a telephone visit.   They were referred to the pharmacist by their PCP for assistance in managing complex medication management.   Subjective:  Care Team: Primary Care Provider: Girtha Rm, NP-C ; Next Scheduled Visit: 12/29/2  Medication Access/Adherence  Current Pharmacy:  [place], [place] - [address] [place] [address] Phone: [phone] Fax: [place]), [place] - [address] [place] ([place]) [place] [address] Phone: [phone] Fax: [place], [place] - [address] [place] [address] Phone: [phone] Fax: [phone]   Patient reports affordability concerns with their medications: Yes  Patient reports access/transportation concerns to their pharmacy: No  Patient reports adherence concerns with their medications:  Yes    Reports unable to get independently to pharmacy, has to ask people to pick up medications for her. Difficulty coordinating refills with [facility]. Reports her husband (in the middle of separation/divorce) generally paid for copays, she has no money to pay copays at this time   Hypertension:  Current medications: amlodipine 5 mg daily - has not been filled in several months, though BP well controlled at recent office visit with GI  Hyperlipidemia/ASCVD Risk Reduction  Current lipid lowering medications: atorvastatin 20 mg daily - though has not been filled in several months  Bipolar Disorder:  Current medications: prescribed quetiapine 300 mg daily + 25 mg twice daily, fluoxetine 20 mg daily, and clonazepam 0.5 mg three times daily as  needed; reports she does not think she is still supposed to be taking aripiprazole  Reports she has been out of quetiapine 300 mg, fluoxetine for a few days. Per fill history, fluoxetine was filled 12/1 so there should be a remaining supply somewhere  Quetiapine $9/30 day supply; clonazepam $8/30 day supply  Neuropathic Pain: Current medications: gabapentin 100 mg three times daily - though reports this dose does not adequately control her pain. Reports she took 300 mg last night at once, had pain relief and was able to sleep. Denies grogginess this morning or dizziness last night. Reports that her fall yesterday was before she took gabapentin  Gabapentin copay $10/30 day supply;   GI Concerns: Current medications: pantoprazole 40 mg twice daily; ($0); prescribed Linzess, though patient reports she took the samples and this causes diarrhea; additionally, copay is $47. She also reports the prescribed bowel prep regimen is $47 and also cannot afford this  Health Maintenance  Health Maintenance Due  Topic Date Due   HIV Screening  Never done   MAMMOGRAM  06/23/2014   Zoster Vaccines- Shingrix (2 of 2) 08/05/2016   DTaP/Tdap/Td (2 - Tdap) 02/28/2019   COVID-19 Vaccine (3 - Pfizer risk series) 01/08/2020   Fecal DNA (Cologuard)  01/10/2020     Objective: Lab Results  Component Value Date   HGBA1C 6.1 05/14/2021    Lab Results  Component Value Date   CREATININE 1.20 (H) 02/18/2022   BUN 8 02/18/2022   NA 137 02/18/2022   K 3.0 (L) 02/18/2022   CL 99 02/18/2022   CO2 21 (L) 02/18/2022    Lab Results  Component Value Date   CHOL 244 (  H) 05/14/2021   HDL 51.60 05/14/2021   LDLCALC 160 (H) 05/14/2021   LDLDIRECT 195.6 10/26/2012   TRIG 164.0 (H) 05/14/2021   CHOLHDL 5 05/14/2021    Medications Reviewed Today     Reviewed by Osker Mason, RPH-CPP (Pharmacist) on 02/19/22 at 1105  Med List Status: <None>   Medication Order Taking? Sig Documenting Provider Last Dose  Status Informant  amLODipine (NORVASC) 5 MG tablet [id number] No Take 1 tablet (5 mg total) by mouth daily.  Patient not taking: Reported on 02/19/2022   Shannon Rm, NP-C Not Taking Active Self  ARIPiprazole (ABILIFY) 5 MG tablet [id number] No Take 1 tablet (5 mg total) by mouth daily.  Patient not taking: Reported on 02/19/2022   Shannon Nations, MD Not Taking Active   atorvastatin (LIPITOR) 20 MG tablet [id number] No Take 1 tablet (20 mg total) by mouth daily.  Patient not taking: Reported on 02/19/2022   Shannon Rm, NP-C Not Taking Active Self           Med Note Shannon [name], [place] T   Thu Feb 19, 2022 10:51 AM)    clonazePAM (KLONOPIN) 0.5 MG tablet [id number] No Take 1 tablet (0.5 mg total) by mouth 3 (three) times daily as needed for anxiety.  Patient not taking: Reported on 02/19/2022   Shannon Nations, MD Not Taking Active   cyclobenzaprine (FLEXERIL) 10 MG tablet [id number] No Take 1 tablet (10 mg total) by mouth at bedtime as needed for muscle spasms.  Patient not taking: Reported on 02/19/2022   Glennon Mac, DO Not Taking Active            Med Note Shannon [name], Shannon [name] Feb 19, 2022 10:51 AM)    FLUoxetine (PROZAC) 20 MG capsule [id number] No Take 1 capsule (20 mg total) by mouth daily.  Patient not taking: Reported on 02/19/2022   Shannon Nations, MD Not Taking Active   gabapentin (NEURONTIN) 100 MG capsule [id number] Yes Take 1 capsule (100 mg total) by mouth 3 (three) times daily. Shannon Rm, NP-C Taking Active   linaclotide (LINZESS) 145 MCG CAPS capsule [id number] No Take 1 capsule (145 mcg total) by mouth daily before breakfast.  Patient not taking: Reported on 02/19/2022   Shannon Creamer, MD Not Taking Active   meloxicam (MOBIC) 15 MG tablet [id number] No Take 1 tablet (15 mg total) by mouth daily as needed for pain.  Patient not taking: Reported on 02/19/2022   Glennon Mac, DO Not Taking Active   Multiple Vitamins-Minerals (BARIATRIC  MULTIVITAMINS/IRON) CAPS [id number]  Take 1 capsule by mouth daily. [name], Shannon L, NP-C  Active   pantoprazole (PROTONIX) 40 MG tablet [id number] Yes Take 1 tablet (40 mg total) by mouth 2 (two) times daily before a meal. [name], Shannon L, NP-C Taking Active   QUEtiapine (SEROQUEL) 25 MG tablet [id number] Yes Take 1 tablet (25 mg total) by mouth 2 (two) times daily. Shannon Nations, MD Taking Active   QUEtiapine (SEROQUEL) 300 MG tablet [id number] No Take 1 tablet (300 mg total) by mouth at bedtime.  Patient not taking: Reported on 02/19/2022   Shannon Nations, MD Not Taking Active   tiZANidine (ZANAFLEX) 4 MG tablet [id number] No Take 1 tablet (4 mg total) by mouth at bedtime.  Patient not taking: Reported on 02/19/2022   Glennon Mac, DO Not Taking Active               Assessment/Plan:   Medication  Management: - Patient interested in transferring care to [place] for free delivery and eventual adherence packaging. She also notes her husband stays near [facility] and could pay her copays easily. Contacted Dr. Adele Schilder to ask to re-send scripts for quetiapines, fluoxetine, and clonazepam to [facility]. Have not heard back, will contact [facility] and ask to transfer in and fill.   Hypertension: - Controlled per last office visit without therapy. Recommend discontinuation of amlodipine  Hyperlipidemia/ASCVD Risk Reduction - Uncontrolled per last lipid panel - Recommend to refill atorvastatin to [facility]  Bipolar Disorder: - Lack of control due to medication access barriers - Medication access as above. Continue to collaborate w/ Dr. Adele Schilder and LCSW.   Neuropathic Pain: - Uncontrolled per patient report - Reiterated to avoid self-adjustment of medications, particularly those that can cause sedation/dizziness and increase fall risk. However, increasing gabapentin dose could be appropriate to better manage neuropathic pain. Encouraged to discuss with PCP tomorrow.   GI Concerns: -  Unable to afford prescribed medications - Will message Dr. Lorenso Courier to note that Linzess and prescribed bowel prep were too expensive.   Follow Up Plan: close follow up with RN CM/LCSW; phone call from pharmacist in ~ 3 weeks for continued medication access support  Catie Hedwig Morton, PharmD, [place], [facility] [phone]

## 2022-02-20 ENCOUNTER — Telehealth (INDEPENDENT_AMBULATORY_CARE_PROVIDER_SITE_OTHER): Payer: Medicare Other | Admitting: Family Medicine

## 2022-02-20 ENCOUNTER — Ambulatory Visit: Payer: Self-pay

## 2022-02-20 ENCOUNTER — Other Ambulatory Visit: Payer: Medicare Other | Admitting: Pharmacist

## 2022-02-20 DIAGNOSIS — R202 Paresthesia of skin: Secondary | ICD-10-CM | POA: Diagnosis not present

## 2022-02-20 DIAGNOSIS — E785 Hyperlipidemia, unspecified: Secondary | ICD-10-CM

## 2022-02-20 DIAGNOSIS — E876 Hypokalemia: Secondary | ICD-10-CM

## 2022-02-20 DIAGNOSIS — I10 Essential (primary) hypertension: Secondary | ICD-10-CM | POA: Diagnosis not present

## 2022-02-20 DIAGNOSIS — F3160 Bipolar disorder, current episode mixed, unspecified: Secondary | ICD-10-CM

## 2022-02-20 MED ORDER — POTASSIUM CHLORIDE CRYS ER 20 MEQ PO TBCR: 20.0000 meq | EXTENDED_RELEASE_TABLET | Freq: Every day | ORAL | 0 refills | Status: DC

## 2022-02-20 NOTE — Progress Notes (Unsigned)
MyChart Video Visit    Virtual Visit via Video Note   This visit type was conducted due to national recommendations for restrictions regarding the COVID-19 Pandemic (e.g. social distancing) in an effort to limit this patient's exposure and mitigate transmission in our community. This patient is at least at moderate risk for complications without adequate follow up. This format is felt to be most appropriate for this patient at this time. Physical exam was limited by quality of the video and audio technology used for the visit. CMA was able to get the patient set up on a video visit.  Patient location: Home. Patient and provider in visit Provider location: Office  I discussed the limitations of evaluation and management by telemedicine and the availability of in person appointments. The patient expressed understanding and agreed to proceed.  Visit Date: 02/20/2022  Today's healthcare provider: Harland Dingwall, NP-C     Subjective:    Patient ID: Shannon [name], female    DOB: 19-Aug-1965, 56 y.o.   MRN: [id number]  Chief Complaint  Patient presents with   Follow-up    Discuss recent fall last Thursday    HPI  ED follow up  Potassium   Magnesium       Past Medical History:  Diagnosis Date   Allergic rhinitis    Allergic rhinitis 01/25/2009        ANEMIA-IRON DEFICIENCY 01/25/2009   Anxiety    Backache 01/25/2009   Qualifier: Diagnosis of  By: Jenny Reichmann MD, Hunt Oris    Bipolar 1 disorder [facility])    Chronic pain syndrome    Complete rotator cuff tear 07/21/2013   Complete tear of right rotator cuff 07/21/2013   Contact lens/glasses fitting    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   [place] DISEASE, LUMBAR 01/25/2009   Essential hypertension 70/0/1749   On Bystolic    GAD (generalized anxiety disorder) 12/28/2013   GERD (gastroesophageal reflux disease)    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   Headache, acute 12/20/2013   Hx of laparoscopic gastric banding 09/03/2010    Surgery date: 07/17/10    HYPERLIPIDEMIA 01/25/2009   Hyperlipidemia with target LDL less than 130 01/25/2009   HYPERTENSION 01/25/2009   Hypokalemia, inadequate intake 09/01/2011   Insomnia 04/04/2011   INSOMNIA-SLEEP DISORDER-UNSPEC 04/30/2009   Qualifier: Diagnosis of  By: Jenny Reichmann MD, Hunt Oris    Iron deficiency anemia 01/25/2009        Labyrinthitis 02/19/2014   Leiomyoma of uterus, unspecified 08/13/2008   Overview:  Leiomyoma Of The Uterus  10/1 IMO update   Localized edema 06/12/2015   MANIC DEPRESSIVE ILLNESS 01/25/2009   pt is unsue if this is her specifc dx   Mixed bipolar I disorder ([place]) 07/12/2012   Morbid obesity ([place]) 01/25/2009   Night sweats    Osteopenia determined by x-ray 06/13/2021   Other abnormal glucose 10/26/2012   Palpitations 10/25/2013   PEPTIC ULCER DISEASE, HELICOBACTER PYLORI POSITIVE 10/03/2009   Peripheral edema 06/25/2015   Pernicious anemia 03/28/2012   Piriformis syndrome of both sides 09/02/2018   PUD (peptic ulcer disease) 10/03/2009        Right shoulder pain 05/03/2013   Dg Shoulder Right  05/03/2013   CLINICAL DATA Pain.  EXAM RIGHT SHOULDER - 2+ VIEW  COMPARISON None.  FINDINGS Acromioclavicular and glenohumeral degenerative change present. Questionable calcific density noted in the region of the supraspinatus space, possibly representing calcific supraspinatus tendinitis. This could represent a sclerotic density in the acromion. MRI of the  right shoulder suggest   Sleep apnea 08/12/2016   SMOKER 01/25/2009   Qualifier: Diagnosis of  By: Jenny Reichmann MD, Hunt Oris    Subacromial bursitis 05/17/2013   SUBSTANCE ABUSE 11/06/2009        Thiamin deficiency 10/30/2012   Tobacco abuse 06/09/2010   Visual disturbance 05/03/2013   Vitamin D deficiency 10/27/2012    Past Surgical History:  Procedure Laterality Date   ABDOMINAL HYSTERECTOMY     BLADDER SURGERY     s/p with ?diverticulitis   HAND TENDON SURGERY  1991   s/p-Right-index and middle   LAPAROSCOPIC GASTRIC BAND REMOVAL WITH  LAPAROSCOPIC GASTRIC SLEEVE RESECTION N/A 08/17/2016   Procedure: LAPAROSCOPIC GASTRIC BAND REMOVAL WITH LAPAROSCOPIC GASTRIC SLEEVE RESECTION WITH UPPER ENDO;  Surgeon: Alphonsa Overall, MD;  Location: [facility];  Service: General;  Laterality: N/A;   LAPAROSCOPIC GASTRIC BANDING  07/14/10   SHOULDER ARTHROSCOPY Right 07/21/2013   Procedure: RIGHT ARTHROSCOPY SHOULDER DEBRIDMENT EXTENTSIVE,ARTHROSCOPIC REMOVE LOOSE FOREIGN BODY, BICEPS TENOLYSIS ;  Surgeon: Johnny Bridge, MD;  Location: [place];  Service: Orthopedics;  Laterality: Right;   TUBAL LIGATION      Family History  Problem Relation Age of Onset   Hypertension Mother    Asthma Mother    Stroke Father    Cirrhosis Father        ETOH   Hypertension Father    Diabetes Father    Alcohol abuse Father    Hypertension Sister    Asthma Sister    Hypertension Brother    ADD / ADHD Son    Hypertension Paternal Aunt    Diabetes Maternal Grandmother    ADD / ADHD Other        3 nephews, also emotional issues   Diabetes Other        Uncle   Diabetes Other        Aunt   Suicidality Neg Hx     Social History   Socioeconomic History   Marital status: Married    Spouse name: Not on file   Number of children: 2   Years of education: Not on file   Highest education level: Not on file  Occupational History   Occupation: STUDENT    Employer: UNEMPLOYED  Tobacco Use   Smoking status: Every Day    Packs/day: 0.25    Years: 20.00    Total pack years: 5.00    Types: Cigarettes   Smokeless tobacco: Never   Tobacco comments:    States has cut back to 5-7 a day and working on this.   Vaping Use   Vaping Use: Never used  Substance and Sexual Activity   Alcohol use: No    Alcohol/week: 0.0 standard drinks of alcohol   Drug use: No   Sexual activity: Not Currently  Other Topics Concern   Not on file  Social History Narrative   Moved from [place] to [place] 2009   2 children-1 boy, 1 girl    Disabled-bipolar   Daily Caffeine Use-1 cup/day   Social Determinants of Health   Financial Resource Strain: High Risk (01/23/2022)   Overall Financial Resource Strain (CARDIA)    Difficulty of Paying Living Expenses: Very hard  Food Insecurity: Food Insecurity Present (01/23/2022)   Hunger Vital Sign    Worried About Running Out of Food in the Last Year: Often true    Ran Out of Food in the Last Year: Often true  Transportation Needs: Unmet Transportation Needs (  01/23/2022)   PRAPARE - Transportation    Lack of Transportation (Medical): Yes    Lack of Transportation (Non-Medical): Yes  Physical Activity: Inactive (02/18/2022)   Exercise Vital Sign    Days of Exercise per Week: 0 days    Minutes of Exercise per Session: 0 min  Stress: Stress Concern Present (02/18/2022)   [place]    Feeling of Stress : Rather much  Social Connections: Moderately Isolated (10/07/2021)   Social Connection and Isolation Panel [NHANES]    Frequency of Communication with Friends and Family: Three times a week    Frequency of Social Gatherings with Friends and Family: Never    Attends Religious Services: Never    Marine scientist or Organizations: No    Attends Archivist Meetings: Never    Marital Status: Married  Human resources officer Violence: Not At Risk (10/07/2021)   Humiliation, Afraid, Rape, and Kick questionnaire    Fear of Current or Ex-Partner: No    Emotionally Abused: No    Physically Abused: No    Sexually Abused: No    Outpatient Medications Prior to Visit  Medication Sig Dispense Refill   amLODipine (NORVASC) 5 MG tablet Take 1 tablet (5 mg total) by mouth daily. (Patient not taking: Reported on 02/19/2022) 30 tablet 2   ARIPiprazole (ABILIFY) 5 MG tablet Take 1 tablet (5 mg total) by mouth daily. (Patient not taking: Reported on 02/19/2022) 30 tablet 1   atorvastatin (LIPITOR) 20 MG tablet Take 1 tablet  (20 mg total) by mouth daily. (Patient not taking: Reported on 02/19/2022) 90 tablet 1   clonazePAM (KLONOPIN) 0.5 MG tablet Take 1 tablet (0.5 mg total) by mouth 3 (three) times daily as needed for anxiety. 75 tablet 2   cyclobenzaprine (FLEXERIL) 10 MG tablet Take 1 tablet (10 mg total) by mouth at bedtime as needed for muscle spasms. (Patient not taking: Reported on 02/19/2022) 15 tablet 0   FLUoxetine (PROZAC) 20 MG capsule Take 1 capsule (20 mg total) by mouth daily. 30 capsule 1   gabapentin (NEURONTIN) 100 MG capsule Take 1 capsule (100 mg total) by mouth 3 (three) times daily. 90 capsule 0   linaclotide (LINZESS) 145 MCG CAPS capsule Take 1 capsule (145 mcg total) by mouth daily before breakfast. (Patient not taking: Reported on 02/19/2022) 30 capsule 2   meloxicam (MOBIC) 15 MG tablet Take 1 tablet (15 mg total) by mouth daily as needed for pain. (Patient not taking: Reported on 02/19/2022) 14 tablet 0   Multiple Vitamins-Minerals (BARIATRIC MULTIVITAMINS/IRON) CAPS Take 1 capsule by mouth daily. 30 capsule 2   pantoprazole (PROTONIX) 40 MG tablet Take 1 tablet (40 mg total) by mouth 2 (two) times daily before a meal. 180 tablet 0   QUEtiapine (SEROQUEL) 25 MG tablet Take 1 tablet (25 mg total) by mouth 2 (two) times daily. 60 tablet 1   QUEtiapine (SEROQUEL) 300 MG tablet Take 1 tablet (300 mg total) by mouth at bedtime. 30 tablet 1   tiZANidine (ZANAFLEX) 4 MG tablet Take 1 tablet (4 mg total) by mouth at bedtime. (Patient not taking: Reported on 02/19/2022) 14 tablet 0   No facility-administered medications prior to visit.    Allergies  Allergen Reactions   Lamictal [Lamotrigine] Rash    ROS     Objective:    Physical Exam  There were no vitals taken for this visit. Wt Readings from Last 3 Encounters:  02/18/22 241 lb (109.3  kg)  02/12/22 241 lb 8 oz (109.5 kg)  12/25/21 282 lb (127.9 kg)       Assessment & Plan:   Problem List Items Addressed This Visit        Cardiovascular and Mediastinum   Essential hypertension - Primary     Other   Hypokalemia   Relevant Medications   potassium chloride SA (KLOR-CON M) 20 MEQ tablet   Pins and needles sensation    I am having Shannon [name] start on potassium chloride SA. I am also having her maintain her amLODipine, atorvastatin, tiZANidine, cyclobenzaprine, ARIPiprazole, Bariatric Multivitamins/Iron, gabapentin, meloxicam, pantoprazole, linaclotide, QUEtiapine, QUEtiapine, FLUoxetine, and clonazePAM.  Meds ordered this encounter  Medications   potassium chloride SA (KLOR-CON M) 20 MEQ tablet    Sig: Take 1 tablet (20 mEq total) by mouth daily.    Dispense:  5 tablet    Refill:  0    Order Specific Question:   Supervising Provider    Answer:   Pricilla Holm A [[id number]]    I discussed the assessment and treatment plan with the patient. The patient was provided an opportunity to ask questions and all were answered. The patient agreed with the plan and demonstrated an understanding of the instructions.   The patient was advised to call back or seek an in-person evaluation if the symptoms worsen or if the condition fails to improve as anticipated.  I provided 17 minutes of face-to-face time during this encounter.   Harland Dingwall, NP-C [facility] [place] [phone] (phone) [phone] (fax)  [place]

## 2022-02-20 NOTE — Progress Notes (Signed)
Care Coordination Call  Contacted patient. She has not heard from [facility] yet on fills/delivery. Contacted [facility]; they received new prescriptions but had a few that were too soon to fill. Contacted [facility], they backed out the medications they had filled for her.   [facility] back. They were able to process and fill fluoxetine ($0), clonazepam ($8.25), quetapine 300 mg ($10); they will deliver this afternoon   Catie Hedwig Morton, PharmD, [place], [facility] [phone]

## 2022-02-22 ENCOUNTER — Encounter: Payer: Self-pay | Admitting: Family Medicine

## 2022-02-22 DIAGNOSIS — I10 Essential (primary) hypertension: Secondary | ICD-10-CM

## 2022-02-22 DIAGNOSIS — E785 Hyperlipidemia, unspecified: Secondary | ICD-10-CM

## 2022-02-22 DIAGNOSIS — F3160 Bipolar disorder, current episode mixed, unspecified: Secondary | ICD-10-CM

## 2022-02-22 MED ORDER — ATORVASTATIN CALCIUM 20 MG PO TABS: 20.0000 mg | ORAL_TABLET | Freq: Every day | ORAL | 1 refills | Status: DC

## 2022-02-24 ENCOUNTER — Other Ambulatory Visit: Payer: Self-pay

## 2022-02-24 MED ORDER — POLYETHYLENE GLYCOL 3350 17 GM/SCOOP PO POWD: ORAL | 0 refills | Status: DC

## 2022-02-25 ENCOUNTER — Ambulatory Visit (INDEPENDENT_AMBULATORY_CARE_PROVIDER_SITE_OTHER): Payer: Medicare Other

## 2022-02-25 ENCOUNTER — Encounter: Payer: Self-pay | Admitting: *Deleted

## 2022-02-25 ENCOUNTER — Telehealth: Payer: Self-pay | Admitting: *Deleted

## 2022-02-25 ENCOUNTER — Encounter: Payer: Self-pay | Admitting: Licensed Clinical Social Worker

## 2022-02-25 DIAGNOSIS — I10 Essential (primary) hypertension: Secondary | ICD-10-CM

## 2022-02-25 DIAGNOSIS — F3160 Bipolar disorder, current episode mixed, unspecified: Secondary | ICD-10-CM

## 2022-02-25 NOTE — Chronic Care Management (AMB) (Signed)
  Chronic Care Management   CCM RN Visit Note   Name: Shannon [name] MRN: [id number] DOB: 1965-07-27  Subjective: Shannon [name] is a 57 y.o. year old female who is a primary care patient of [name], [name], NP-C. The patient was referred to the Chronic Care Management team for assistance with care management needs subsequent to provider initiation of CCM services and plan of care.    Today's Visit:  Engaged with patient by telephone for follow up visit.        Goals Addressed             This Visit's Progress    Goal: CCM (Mixed Bipolar Disorder) Expected Outcome:  Monitor, Self-Manage And Reduce Symptoms of Mixed Bipolar Disorder       Current Barriers:  Chronic Disease Management support and education needs related to Mixed Bipolar Disorder Lacks caregiver support.  Film/video editor.  Transportation barriers  Planned Interventions: Reviewed plan for treatment of Mixed Bipolar Disorder.  Discussed involvement of APS. Report was submitted by team LCSW. Reports completing initial with her APS case manager. Pending follow up regarding options for receiving spousal support. Confirms speaking with the Secondary school teacher regarding affordability. Anticipates being able to afford payments for the next few months. Plan is not to remain in the condo long term d/t accessibility. Unit is located on the 2nd floor. Reviewed plan regarding preference to live independently with personal care services. Reports not qualifying for Medicaid a few years ago d/t combined income with spouse.  Will plan to resubmit application. Reports experiencing a fall since last outreach. Notes fall was d/t neuropathy and legs "giving out" Denies injuries. Reports paramedic team was contacted d/t not being able to get up. Pending follow up Neurology Rehab this week (12/28) Reports a friend is currently visiting and anticipates being available to provide transportation for the appointment. Active  Listening/Emotional support provided. Thorough discussion regarding current symptoms. Reports feeling ok today. Denies SI/HI. Remains hopeful and motivated to collaborate with medical staff and care team to receive needed resources. Will continue to collaborate with APS manager as case progresses.  Symptom Management: Take medications as prescribed   Attend all scheduled provider appointments Call pharmacy for medication refills 3-7 days in advance of running out of medications Attend church or other social activities Work with the care management team to address care coordination needs and will continue to work with the clinical team to address health care and disease management related needs Call provider office for new concerns or questions  Call the Suicide and Crisis Lifeline: 988 Call the [place] National Suicide Prevention Lifeline: 517-096-8866 or TTY: (204)371-9089 TTY 204-888-6296) to talk to a trained counselor Call 1-800-273-TALK (toll free, 24 hour hotline) Go to [facility] [address] [place] [phone]) Call 911 if experiencing a Mental Health or [place]   Follow Up Plan:   Will follow up next week            PLAN: Will follow up next week.  Horris Latino RN Care Manager/Chronic Care Management [phone]

## 2022-02-25 NOTE — Chronic Care Management (AMB) (Signed)
Chronic Care Management   CCM RN Visit Note  02/25/2022 Name: Shannon [name] MRN: [id number] DOB: 01-28-1966  Subjective: Shannon [name] is a 57 y.o. year old female who is a primary care patient of [name], [name], NP-C. The patient was referred to the Chronic Care Management team for assistance with care management needs subsequent to provider initiation of CCM services and plan of care.    Today's Visit:  Engaged with patient by telephone for follow up visit.      Goals Addressed             This Visit's Progress    Goal: CCM (Mixed Bipolar Disorder) Expected Outcome:  Monitor, Self-Manage And Reduce Symptoms of Mixed Bipolar Disorder       Current Barriers:  Chronic Disease Management support and education needs related to Mixed Bipolar Disorder Lacks caregiver support.  Film/video editor.  Transportation barriers  Planned Interventions: Reviewed plan for treatment of Mixed Bipolar Disorder.  Reviewed symptoms and concerns since last outreach. Voiced additional concerns today regarding living arrangements. Reports the rental condo is on the 2nd floor. Expressed concerns that she will not be able to afford the condo in the near future d/t being on a fixed income. Wants to discuss options should she have to relocate. Thorough discussion regarding ability to perform self care and complete basic needs. Reports being able to complete some tasks. Reports the fatigue, mood and impaired greatly affect her motivation to complete tasks. Reports requiring a legal guardian in the past and being placed in a facility for treatment and assistance with life skills. Reports she is not opposed to returning to a facility if that is her only option for receiving assistance.  Discussed options for community support such as church groups, neighbors and family. Reports she is not receiving support from church groups or two adult children. Reports not knowing any of her neighbors.  Completed  outreach with the LCSW on yesterday. Advised that we would collaborate to discuss options for optimal care.  Denies suicidal ideation today. Verbalized need to call 911 if her symptoms worsen over the weekend. Active Listening/Reflection Utilized Emotional support provided. Update 01/28/22: Collaboration with Care Coordination LCSW regarding multiple SDOH needs. Thorough discussion regarding options for personal services, financial resources, and housing. Will follow up with Ms. Nale regarding need for APS involvement. Will plan for outreach at least weekly as care management needs are addressed. Follow up call with Ms. Strum. Agreeable to plan.  Symptom Management: Take medications as prescribed   Attend all scheduled provider appointments Call pharmacy for medication refills 3-7 days in advance of running out of medications Attend church or other social activities Work with the care management team to address care coordination needs and will continue to work with the clinical team to address health care and disease management related needs Call provider office for new concerns or questions  Call the Suicide and Crisis Lifeline: 988 Call the [place] National Suicide Prevention Lifeline: 450-876-2213 or TTY: 478-838-6522 TTY (787)344-5672) to talk to a trained counselor Call 1-800-273-TALK (toll free, 24 hour hotline) Go to [facility] [address], [place] [phone]) Call 911 if experiencing a Mental Health or [place]   Follow Up Plan:   Will follow up next week              PLAN: Will follow up  next week   [place] Manager/Chronic Care Management [phone]

## 2022-02-25 NOTE — Chronic Care Management (AMB) (Signed)
  Chronic Care Management   CCM RN Visit Note   Name: Shannon [name] MRN: [id number] DOB: 12-03-1965  Subjective: Shannon [name] is a 57 y.o. year old female who is a primary care patient of Shannon [name], Shannon L, NP-C. The patient was referred to the Chronic Care Management team for assistance with care management needs subsequent to provider initiation of CCM services and plan of care.    Today's Visit:  Engaged with patient by telephone for follow up visit.        Goals Addressed             This Visit's Progress    Goal: CCM (Mixed Bipolar Disorder) Expected Outcome:  Monitor, Self-Manage And Reduce Symptoms of Mixed Bipolar Disorder       Current Barriers:  Chronic Disease Management support and education needs related to Mixed Bipolar Disorder Lacks caregiver support.  Film/video editor.  Transportation barriers  Planned Interventions: Reviewed plan for treatment of Mixed Bipolar Disorder.  Reviewed recent Emergency Room visit. Paramedics were notified on 02/18/22 due to several hours of severe pain and inability to ambulate down the stairs. Transported to [facility].  Reviewed symptoms. Reports significant fatigue d/t not having required medications. Reports contacting spouse to pick up needed prescriptions but seems doubtful that he will assist. Will update pharmacist regarding options and follow up tomorrow. Previously scheduled physical therapy with the Neurology Rehab team. Ambulation and transportation are current barriers. She will benefit from [facility]. Pending outreach with PCP. Reviewed safety and fall prevention measures. Reviewed plan for assistance with care. Still not receiving assistance with care or financial support from spouse. Reports a friend is currently in the area visiting but unsure how long she will be available. Pending outreach with the Care Coordination SW to discuss options for assistance with SDOH needs. Thorough discussion regarding  worsening s/sx and indications for seeking immediate medical assistance. Update 02/10/22: Completed virtual outreach with PCP. Referral for home health PT was placed.  Pharmacy collaboration. Reports prescriptions were submitted to [facility] and pending home delivery.  Reviewed symptoms today. Reports continued neuropathy and episodes of pain in lower extremities but overall feeling much better today. Support system remains very limited. Agreeable to continued collaboration to discuss options for assistance. Remains very motivated to remain as independent as possible and improve overall health.  Verbalized knowledge of indications for seeking immediate medical assistance and contacting 911.   Symptom Management: Take medications as prescribed   Attend all scheduled provider appointments Call pharmacy for medication refills 3-7 days in advance of running out of medications Attend church or other social activities Work with the care management team to address care coordination needs and will continue to work with the clinical team to address health care and disease management related needs Call provider office for new concerns or questions  Call the Suicide and Crisis Lifeline: 988 Call the [place] National Suicide Prevention Lifeline: (551)392-6617 or TTY: (602)586-1495 TTY (901) 029-2604) to talk to a trained counselor Call 1-800-273-TALK (toll free, 24 hour hotline) Go to [facility] [address] [place] [phone]) Call 911 if experiencing a Mental Health or [place]   Follow Up Plan:   Will follow up next week            PLAN: Will follow up next week  [place] Manager/Chronic Care Management [phone]

## 2022-02-25 NOTE — Patient Outreach (Signed)
Care Coordination TOC Note Transition Care Management Follow-up Telephone Call Date of discharge and from where: ED visit 12/27-28/2023: chronic pain; mechanical fall; LWBS/ patient elopement; ED EMMI Red Alert notification: "No discharge papers, sad, hopeless, anxious, empty" How have you been since you were released from the hospital? "I ended up leaving the ER because the wait so long and they took forever to get to me.  I am working with the nurse at my PCP office to make sure I have a way to my appointments and have what I need.  We have been working together for a long time and she is supposed to call me in the next few minutes" Any questions or concerns? No  Items Reviewed: Did the pt receive and understand the discharge instructions provided? No - patient eloped ED visit and no discharge AVS was generated Medications obtained and verified? No - patient declined; currently waiting on CCM RN CM to call her for scheduled appointment this afternoon-- deferred accordingly Other? No  Any new allergies since your discharge? No  Dietary orders reviewed? No Do you have support at home? Yes   Home Care and Equipment/Supplies: Were home health services ordered? not applicable If so, what is the name of the agency? N/A  Has the agency set up a time to come to the patient's home? not applicable Were any new equipment or medical supplies ordered?  No What is the name of the medical supply agency? N/A Were you able to get the supplies/equipment? not applicable Do you have any questions related to the use of the equipment or supplies? No N/A-  Functional Questionnaire: (I = Independent and D = Dependent) ADLs: - unable to assess- patient awaiting call from established RN CM that has been active in her care for several months  Bathing/Dressing- - unable to assess- patient awaiting call from established RN CM that has been active in her care for several months  Meal Prep- -  unable to assess-  patient awaiting call from established RN CM that has been active in her care for several months  Eating- -  unable to assess- patient awaiting call from established RN CM that has been active in her care for several months  Maintaining continence- -  unable to assess- patient awaiting call from established RN CM that has been active in her care for several months  Transferring/Ambulation- -  unable to assess- patient awaiting call from established RN CM that has been active in her care for several months  Managing Meds- -  unable to assess- patient awaiting call from established RN CM that has been active in her care for several months  Follow up appointments reviewed:  PCP Hospital f/u appt confirmed? No  Scheduled to see - on - @ [facility] f/u appt confirmed? No  Scheduled to see - on - @ - Are transportation arrangements needed?  Reports working with CCM RN CM to secure transportation- RN CM scheduled to call her today in 10 minutes; verified with RN CM that she was able to connect with patient If their condition worsens, is the pt aware to call PCP or go to the Emergency Dept.? Yes Was the patient provided with contact information for the PCP's office or ED? No Was to pt encouraged to call back with questions or concerns? Yes  SDOH assessments and interventions completed:   No- verified recently completed by CCM RN CM currently active in patient's ongoing care  Care Coordination Interventions:  No Care  Coordination interventions needed at this time.   Encounter Outcome:  Pt. Visit Completed    Oneta Rack, RN, BSN, CCRN Alumnus RN CM Care Coordination/ Transition of [place] Management [phone]: direct office

## 2022-02-25 NOTE — Chronic Care Management (AMB) (Signed)
Chronic Care Management   CCM RN Visit Note  02/25/2022 Name: Shannon [name] MRN: [id number] DOB: [birth year]  Subjective: Shannon [name] is a 57 y.o. year old female who is a primary care patient of Shannon [name], [name], NP-C. The patient was referred to the Chronic Care Management team for assistance with care management needs subsequent to provider initiation of CCM services and plan of care.    Today's Visit:  Engaged with patient by telephone for follow up visit.       Goals Addressed             This Visit's Progress    Goal: CCM (Hypertension) Expected Outcome: Monitor, Self-Manage and Reduce Symptoms of Hypertension       Current Barriers:  Chronic Disease Management support and education needs related to Hypertension Lacks caregiver support.  Film/video editor.  Transportation barriers  Planned Interventions: Reviewed plan for hypertension management. Reviewed medications and indications for use. Reports taking medications as prescribed. Expressed concerned regarding ability to change pill bottle caps. Programmer, multimedia. Pharmacy staff will check to confirm that [place] caps are available. Provided information regarding established blood pressure parameters. Encouraged to monitor a few times a week if unable to monitor daily. Encouraged to maintain a log. Reviewed indications for notifying a provider. Reviewed symptoms. Denies chest pain, palpitations, or shortness of breath. Denies recent headaches or visual changes. Activity tolerance remains limited d/t neuropathy. Pending start of Home Heath Physical Therapy. Discussed compliance with recommended cardiac prudent diet. Encouraged to read nutrition labels, monitor sodium intake, and avoid highly processed foods when possible. Previously expressed concerns regarding nutritional resources. Pending outreach with SW to discuss options on 02/26/22. Discussed complications of uncontrolled blood pressure.  Reviewed s/sx of  heart attack, stroke and worsening symptoms that require immediate medical attention.  Symptom Management: Take medications as prescribed   Attend all scheduled provider appointments Call pharmacy for medication refills 3-7 days in advance of running out of medications Call provider office for new concerns or questions  Continue to work with the care management team to address care coordination needs. Seek immediate medical attention for worsening symptoms   Follow Up Plan:  Will follow up within the next week         Goal: CCM (Mixed Bipolar Disorder) Expected Outcome:  Monitor, Self-Manage And Reduce Symptoms of Mixed Bipolar Disorder       Current Barriers:  Chronic Disease Management support and education needs related to Mixed Bipolar Disorder Lacks caregiver support.  Film/video editor.  Transportation barriers  Planned Interventions: Reviewed plan for treatment of Mixed Bipolar Disorder.  Discussed plan for multiple SDOH needs. Message received from SW following outreach on 02/26/21. Per SW, resources are currently not available. Recommended that patient continue outreach and engagement with assigned APS SW.  Patient reports pending follow up with APS SW next week.  Will collaborate with community agencies for additional assistance with SDOH needs. Patient provided email address. Will send link for Medicaid Application today. Will send information r/t resources via email as they become available. Discussed plan for physical therapy. Reports still pending outreach to schedule initial home therapy evaluation. Will follow up with the home health and Neuro PT teams to confirm plan. Collaborated with the Neurology team regarding pending clinic visit. Agreed that patient would benefit from being evaluated before February. Informed of concerns r/t transportation d/t current inability to ambulate down the steps. Team will place patient on a wait list. Will contact RNCM as appointments  become available. Hoping that patient's ability to ambulate will improve once physical therapy is started. Discussed concerns r/t opening pill bottles. Reports using bottles that worked well in the past. Will touch base with the Pharmacist to discuss options available at [facility]. Overall reports feeling well today.  Reviewed indications for calling 911 or seeking immediate medical assistance.    Symptom Management: Take medications as prescribed   Attend all scheduled provider appointments Call pharmacy for medication refills 3-7 days in advance of running out of medications Attend church or other social activities Work with the care management team to address care coordination needs and will continue to work with the clinical team to address health care and disease management related needs Call provider office for new concerns or questions  Call the Suicide and Crisis Lifeline: 988 Call the [place] National Suicide Prevention Lifeline: (856)487-6545 or TTY: (772)017-2584 TTY (253) 462-9222) to talk to a trained counselor Call 1-800-273-TALK (toll free, 24 hour hotline) Go to [facility] [address] [place] [phone]) Call 911 if experiencing a Mental Health or [place]   Follow Up Plan: Will follow up within the next week          PLAN: Will follow up within the next week.   Horris Latino RN Care Manager/Chronic Care Management [phone]

## 2022-02-26 ENCOUNTER — Ambulatory Visit: Payer: Self-pay

## 2022-02-26 NOTE — Patient Outreach (Signed)
  Care Coordination   Follow Up Visit Note   02/26/2022 Name: Shannon [name] MRN: [id number] DOB: 06-05-1965  Shannon [name] is a 57 y.o. year old female who sees Shannon [name], [place], Shannon [name] for primary care. I spoke with  Shannon [name] by phone today.  What matters to the patients health and wellness today?  Resource linking    Goals Addressed             This Visit's Progress    COMPLETED: Care Coordination Activities       Care Coordination Interventions: Referral received from Shannon [name] Shannon [name] requesting assistance with patient SDoH needs including transportation, food insecurity, and housing Performed chart review patient recently active with LCSW who linked the patient to APS; patient endorses she is actively working with Shannon [name] from [place] the patient lives in a [place] which she is paying rent on, hopeful to obtain Section 8 to assist with financial burden of rent Patient is working with APS regarding the concern for abandonment from her husband Reviewed opportunity to apply for FNS benefits to help cover the cost of food and Medicaid which may offer additional financial support - patient concerned she is not eligible due to being legally married and concern for spousal income Encouraged the patient to speak with her APS worker about FNS and Medicaid eligibility - patient agreeable and will discuss on Monday 1/8 during scheduled APS call Patient reports she is currently unable to leave her home without the assistance of EMS due to inability to navigate stairs Determined the patient must navigate approximately 15 steps; advised the patient unfortunately there are no resources to assist with ramp construction over 15 steps and she would need to move in order to exit without EMS; patient unable to move at this time due to estrangement of spouse and finances Advised the patient to remain closely connected with APS as they assist patient with navigating next steps,  collaboration with RN Care Manager to advise of lack of resources available at this time and plan for patient to remain connected with APS for assistance         SDOH assessments and interventions completed:  Yes  SDOH Interventions Today    Flowsheet Row Most Recent Value  SDOH Interventions   Food Insecurity Interventions Other (Comment)  [advised to apply for food stamps]  Housing Interventions Other (Comment)  [pt unablew to move to a more accessible apartment at this time due to finances]  Transportation Interventions Other (Comment)  [pt unable to safely exit her home due to having to navigate 15 steps. Currently uses EMS for leaving - advised there are no other resources unless she were to [place] Coordination Interventions:  Yes, provided   Follow up plan: No further intervention required. Patient to remain engaged with CCM RN Care Manager [facility].    Encounter Outcome:  Pt. Visit Completed   Daneen Schick, BSW, CDP Social Worker, Certified Dementia Practitioner [place] Management  Care Coordination [phone]

## 2022-02-26 NOTE — Patient Instructions (Signed)
Visit Information  Thank you for taking time to visit with me today. Please don't hesitate to contact me if I can be of assistance to you.   Following are the goals we discussed today:   Goals Addressed             This Visit's Progress    COMPLETED: Care Coordination Activities       Care Coordination Interventions: Referral received from [place] Neldon Labella requesting assistance with patient SDoH needs including transportation, food insecurity, and housing Performed chart review patient recently active with LCSW who linked the patient to APS; patient endorses she is actively working with Elmyra Ricks from [place] the patient lives in a condo which she is paying rent on, hopeful to obtain Section 8 to assist with financial burden of rent Patient is working with APS regarding the concern for abandonment from her husband Reviewed opportunity to apply for FNS benefits to help cover the cost of food and Medicaid which may offer additional financial support - patient concerned she is not eligible due to being legally married and concern for spousal income Encouraged the patient to speak with her APS worker about FNS and Medicaid eligibility - patient agreeable and will discuss on Monday 1/8 during scheduled APS call Patient reports she is currently unable to leave her home without the assistance of EMS due to inability to navigate stairs Determined the patient must navigate approximately 15 steps; advised the patient unfortunately there are no resources to assist with ramp construction over 15 steps and she would need to move in order to exit without EMS; patient unable to move at this time due to estrangement of spouse and finances Advised the patient to remain closely connected with APS as they assist patient with navigating next steps, collaboration with RN Care Manager to advise of lack of resources available at this time and plan for patient to remain connected with APS for  assistance         If you are experiencing a Mental Health or [place] or need someone to talk to, please call 911  Patient verbalizes understanding of instructions and care plan provided today and agrees to view in [place]. Active MyChart status and patient understanding of how to access instructions and care plan via MyChart confirmed with patient.     No further follow up required: Please contact your primary care provider as needed.  Daneen Schick, BSW, CDP Social Worker, Certified Dementia Practitioner [place] Management  Care Coordination [phone]

## 2022-02-27 ENCOUNTER — Ambulatory Visit: Payer: Medicare Other

## 2022-02-27 DIAGNOSIS — F3160 Bipolar disorder, current episode mixed, unspecified: Secondary | ICD-10-CM

## 2022-03-02 ENCOUNTER — Ambulatory Visit: Payer: Self-pay

## 2022-03-02 DIAGNOSIS — I10 Essential (primary) hypertension: Secondary | ICD-10-CM

## 2022-03-02 NOTE — Chronic Care Management (AMB) (Signed)
  Chronic Care Management   CCM RN Visit Note   Name: Shannon [name] MRN: [id number] DOB: 29-Nov-1965  Subjective: Shannon [name] is a 57 y.o. year old female who is a primary care patient of [name], [name], NP-C. The patient was referred to the Chronic Care Management team for assistance with care management needs subsequent to provider initiation of CCM services and plan of care.    Today's Visit:  Engaged with patient by telephone for follow up visit.        Goals Addressed             This Visit's Progress    Goal: CCM (Mixed Bipolar Disorder) Expected Outcome:  Monitor, Self-Manage And Reduce Symptoms of Mixed Bipolar Disorder       Current Barriers:  Chronic Disease Management support and education needs related to Mixed Bipolar Disorder Lacks caregiver support.  Film/video editor.  Transportation barriers  Planned Interventions: Reviewed plan for treatment of Mixed Bipolar Disorder.  Discussed plan for multiple SDOH needs. Message received from SW following outreach on 02/26/21. Per SW, resources are currently not available. Recommended that patient continue outreach and engagement with assigned APS SW.  Patient reports pending follow up with APS SW next week.  Will collaborate with community agencies for additional assistance with SDOH needs. Patient provided email address. Will send link for Medicaid Application today. Will send information r/t resources via email as they become available. Discussed plan for physical therapy. Reports still pending outreach to schedule initial home therapy evaluation. Will follow up with the home health and Neuro PT teams to confirm plan. Collaborated with the Neurology team regarding pending clinic visit. Agreed that patient would benefit from being evaluated before February. Informed of concerns r/t transportation d/t current inability to ambulate down the steps. Team will place patient on a wait list. Will contact RNCM as  appointments become available. Hoping that patient's ability to ambulate will improve once physical therapy is started. Discussed concerns r/t opening pill bottles. Reports using bottles in the past that worked well in the past. Will touch base with the Pharmacist to discuss options available at [facility]. Overall reports feeling well today.  Reviewed indications for calling 911 or seeking immediate medical assistance.   Symptom Management: Take medications as prescribed   Attend all scheduled provider appointments Call pharmacy for medication refills 3-7 days in advance of running out of medications Attend church or other social activities Work with the care management team to address care coordination needs and will continue to work with the clinical team to address health care and disease management related needs Call provider office for new concerns or questions  Call the Suicide and Crisis Lifeline: 988 Call the [place] National Suicide Prevention Lifeline: 216 091 2942 or TTY: 804-672-5245 TTY (870)516-7250) to talk to a trained counselor Call 1-800-273-TALK (toll free, 24 hour hotline) Go to [facility] [address] [place] [phone]) Call 911 if experiencing a Mental Health or [place]   Follow Up Plan:   Will follow up next week          PLAN: Will follow up next week   [place] Manager/Chronic Care Management [phone]

## 2022-03-02 NOTE — Chronic Care Management (AMB) (Signed)
  Chronic Care Management   CCM RN Visit Note  03/02/2022 Name: FIDELA CIESLAK MRN: [id number] DOB: 1965-09-23  Subjective: KANDEE ESCALANTE is a 57 y.o. year old female who is a primary care patient of [name], [name], NP-C. The patient was referred to the Chronic Care Management team for assistance with care management needs subsequent to provider initiation of CCM services and plan of care.    Today's Visit:  Collaboration with [facility]      Goal: CCM (Mixed Bipolar Disorder) Expected Outcome:  Monitor, Self-Manage And Reduce Symptoms of Mixed Bipolar Disorder       Current Barriers:  Chronic Disease Management support and education needs related to Mixed Bipolar Disorder Lacks caregiver support.  Film/video editor.  Transportation barriers  Planned Interventions: Reviewed plan for treatment of Mixed Bipolar Disorder.  Discussed plan for multiple SDOH needs. Message received from SW following outreach on 02/26/21. Per SW, resources are currently not available. Recommended that patient continue outreach and engagement with assigned APS SW.  Patient reports pending follow up with APS SW next week.  Will collaborate with community agencies for additional assistance with SDOH needs. Patient provided email address. Will send link for Medicaid Application today. Will send information r/t resources via email as they become available. Discussed plan for physical therapy. Reports still pending outreach to schedule initial home therapy evaluation. Will follow up with the home health and Neuro PT teams to confirm plan. Collaborated with the Neurology team regarding pending clinic visit. Agreed that patient would benefit from being evaluated before February. Informed of concerns r/t transportation d/t current inability to ambulate down the steps. Team will place patient on a wait list. Will contact RNCM as appointments become available. Hoping that patient's ability to ambulate will improve  once physical therapy is started. Discussed concerns r/t opening pill bottles. Reports using bottles that worked well in the past. Will touch base with the Pharmacist to discuss options available at [facility]. Update 03/02/22: [place] regarding patient's difficulty opening current pill bottles. Discussed options for Ezy Dose Snap Cap pill bottles or Push tabs. Pharmacy staff will confirm if other medication caps are available. Will keep patient updated.    Symptom Management: Take medications as prescribed   Attend all scheduled provider appointments Call pharmacy for medication refills 3-7 days in advance of running out of medications Attend church or other social activities Work with the care management team to address care coordination needs and will continue to work with the clinical team to address health care and disease management related needs Call provider office for new concerns or questions  Call the Suicide and Crisis Lifeline: 988 Call the [place] National Suicide Prevention Lifeline: (530)713-5234 or TTY: (409)450-8862 TTY 413-104-5812) to talk to a trained counselor Call 1-800-273-TALK (toll free, 24 hour hotline) Go to [facility] [address], [place] [phone]) Call 911 if experiencing a Mental Health or [place]   Follow Up Plan: Will follow up within the next week           PLAN: Will follow up within the next week.   Horris Latino RN Care Manager/Chronic Care Management [phone]

## 2022-03-03 ENCOUNTER — Ambulatory Visit: Payer: Medicare Other

## 2022-03-03 DIAGNOSIS — F3160 Bipolar disorder, current episode mixed, unspecified: Secondary | ICD-10-CM

## 2022-03-03 NOTE — Chronic Care Management (AMB) (Signed)
  Chronic Care Management   CCM RN Visit Note  03/03/2022 Name: Shannon [name] MRN: [id number] DOB: [birth year]  Subjective: Shannon [name] is a 57 y.o. year old female who is a primary care patient of [name], [name], NP-C. The patient was referred to the Chronic Care Management team for assistance with care management needs subsequent to provider initiation of CCM services and plan of care.    Today's Visit:  Engaged with patient by telephone for follow up visit.      Goals Addressed             This Visit's Progress    Goal: CCM (Mixed Bipolar Disorder) Expected Outcome:  Monitor, Self-Manage And Reduce Symptoms of Mixed Bipolar Disorder       Current Barriers:  Chronic Disease Management support and education needs related to Mixed Bipolar Disorder Lacks caregiver support.  Film/video editor.  Transportation barriers  Planned Interventions: Reviewed plan for treatment of Mixed Bipolar Disorder.  Reviewed symptoms. Continues to experience chronic fatigue. Reports increased episodes of depression which she attributes to her health and current living situations. Feels that she is managing well with medications and therapy. Denies SI/HI. Reviewed plan regarding options for family support. Reports still not receiving needed financial support from her spouse. Reports having a friend in the area that may be able to assist. Reports maintaining contact with the APS Case Manager regarding plan for obtaining support. Reports discussing plan with current property manager who understands her current situation. Denies concerns regarding risk of eviction. Reviewed safety and fall prevention measures. Denies falls since incident on 02/18/22. Denies urgent care management needs. Thorough discussion regarding worsening s/sx and indications for seeking immediate medical assistance.  Symptom Management: Take medications as prescribed   Attend all scheduled provider appointments Call  pharmacy for medication refills 3-7 days in advance of running out of medications Work with the care management team to address care coordination needs and will continue to work with the clinical team to address health care and disease management related needs Call provider office for new concerns or questions  Call the Suicide and Crisis Lifeline: 988 Call the [place] National Suicide Prevention Lifeline: (702) 425-5380 or TTY: (619) 293-1487 TTY 8033764176) to talk to a trained counselor Call 1-800-273-TALK (toll free, 24 hour hotline) Go to [facility] [address], [place] [phone]) Call 911 if experiencing a Mental Health or [place]   Follow Up Plan: Will follow up within the next week           PLAN: A member of the care management team will follow up within the next week.   Horris Latino RN Care Manager/Chronic Care Management [phone]

## 2022-03-04 ENCOUNTER — Ambulatory Visit: Payer: Self-pay

## 2022-03-04 ENCOUNTER — Telehealth: Payer: Self-pay

## 2022-03-04 DIAGNOSIS — F3160 Bipolar disorder, current episode mixed, unspecified: Secondary | ICD-10-CM

## 2022-03-04 NOTE — Chronic Care Management (AMB) (Signed)
  Chronic Care Management   CCM RN Visit Note  03/04/2022 Name: Shannon [name] MRN: [id number] DOB: 1965-04-04  Subjective: Shannon [name] is a 57 y.o. year old female who is a primary care patient of Shannon [name], Shannon L, NP-C. The patient was referred to the Chronic Care Management team for assistance with care management needs subsequent to provider initiation of CCM services and plan of care.    Today's Visit:  Collaboration with the APS Case Liaison.     Goals Addressed             This Visit's Progress    Goal: CCM (Mixed Bipolar Disorder) Expected Outcome:  Monitor, Self-Manage And Reduce Symptoms of Mixed Bipolar Disorder       Current Barriers:  Chronic Disease Management support and education needs related to Mixed Bipolar Disorder Lacks caregiver support.  Film/video editor.  Transportation barriers  Planned Interventions: Reviewed plan for treatment of Mixed Bipolar Disorder.  Reviewed symptoms. Continues to experience chronic fatigue. Reports increased episodes of depression which she attributes to her health and current living situations. Feels that she is managing well with medications and therapy. Denies SI/HI. Reviewed plan regarding options for family support. Reports still not receiving needed financial support from her spouse. Reports having a friend in the area that may be able to assist. Reports maintaining contact with the APS Case Manager regarding plan for obtaining support. Reports discussing plan with current property manager who understands her current situation. Denies concerns regarding risk of eviction. Reviewed safety and fall prevention measures. Denies falls since incident on 02/18/22. Denies urgent care management needs. Thorough discussion regarding worsening s/sx and indications for seeking immediate medical assistance. Update 03/04/22: Collaboration with APS Case Liaison. Discussed need for a Section 8 voucher. Reports the housing list has  opened, however priority is being given to patients 68 and older. Also discussed options for Special Assistance if Shannon [name] qualifies for Medicaid. Reports [facility] has a new Animator for residents that are likely to require emergency assistance d/t health and mobility limitations. Reports putting Shannon [name]'s name on the list. Will continue to review options for safe housing and in-home assistance.  Symptom Management: Take medications as prescribed   Attend all scheduled provider appointments Call pharmacy for medication refills 3-7 days in advance of running out of medications Work with the care management team to address care coordination needs and will continue to work with the clinical team to address health care and disease management related needs Call provider office for new concerns or questions  Call the Suicide and Crisis Lifeline: 988 Call the [place] National Suicide Prevention Lifeline: 5676526126 or TTY: (405)029-0568 TTY 7804426942) to talk to a trained counselor Call 1-800-273-TALK (toll free, 24 hour hotline) Go to [facility] [address], [place] [phone]) Call 911 if experiencing a Mental Health or [place]   Follow Up Plan: Will follow up within the next week            PLAN: Will follow up within the next week.   Horris Latino RN Care Manager/Chronic Care Management [phone]

## 2022-03-05 ENCOUNTER — Ambulatory Visit: Payer: Self-pay

## 2022-03-05 DIAGNOSIS — F3160 Bipolar disorder, current episode mixed, unspecified: Secondary | ICD-10-CM

## 2022-03-05 NOTE — Chronic Care Management (AMB) (Signed)
  Chronic Care Management   CCM RN Visit Note  03/05/2022 Name: Shannon [name] MRN: [id number] DOB: Aug 21, 1965  Subjective: Shannon [name] is a 57 y.o. year old female who is a primary care patient of [name], [name], NP-C. The patient was referred to the Chronic Care Management team for assistance with care management needs subsequent to provider initiation of CCM services and plan of care.    Today's Visit:  Engaged with patient by telephone for follow up visit.    Goals Addressed             This Visit's Progress    Goal: CCM (Mixed Bipolar Disorder) Expected Outcome:  Monitor, Self-Manage And Reduce Symptoms of Mixed Bipolar Disorder       Current Barriers:  Chronic Disease Management support and education needs related to Mixed Bipolar Disorder Lacks caregiver support.  Film/video editor.  Transportation barriers  Planned Interventions: Reviewed plan for treatment of Mixed Bipolar Disorder.  Reviewed symptoms. Continues to experience chronic fatigue. Reports increased episodes of depression which she attributes to her health and current living situations. Feels that she is managing well with medications and therapy. Denies SI/HI. Reviewed plan regarding options for family support. Reports still not receiving needed financial support from her spouse. Reports having a friend in the area that may be able to assist. Reports maintaining contact with the APS Case Manager regarding plan for obtaining support. Reports discussing plan with current property manager who understands her current situation. Denies concerns regarding risk of eviction. Reviewed safety and fall prevention measures. Denies falls since incident on 02/18/22. Denies urgent care management needs. Thorough discussion regarding worsening s/sx and indications for seeking immediate medical assistance. Update 03/05/22: Outreach to discuss options for in home assistance. Currently working to complete Medicaid  application. Discussed possible option for home visits via [facility]. Per [facility] staff, some UHC plans are contracted to receive service. Will follow up to confirm patient's plan and availability for services. Discussed option for possible assistance from a family member. Reports her cousin as a Museum/gallery conservator and discussed possibly moving to [place] to assist with housing and personal care. Discussed possible need to grant her cousin guardianship to ensure she can live in the residence and receive needed care. Will follow up regarding decision. Will assist with forwarding required documents/links for guardianship if the cousin decides to move to the area.   Symptom Management: Take medications as prescribed   Attend all scheduled provider appointments Call pharmacy for medication refills 3-7 days in advance of running out of medications Work with the care management team to address care coordination needs and will continue to work with the clinical team to address health care and disease management related needs Call provider office for new concerns or questions  Call the Suicide and Crisis Lifeline: 988 Call the [place] National Suicide Prevention Lifeline: (206) 856-5495 or TTY: 718-172-4582 TTY (769)041-9710) to talk to a trained counselor Call 1-800-273-TALK (toll free, 24 hour hotline) Go to [facility] [address], [place] [phone]) Call 911 if experiencing a Mental Health or [place]   Follow Up Plan: Will follow up within the next week               PLAN: Will follow up this week  [place] Manager/Chronic Care Management [phone]

## 2022-03-06 ENCOUNTER — Telehealth: Payer: Medicare Other

## 2022-03-06 ENCOUNTER — Ambulatory Visit: Payer: Self-pay

## 2022-03-06 DIAGNOSIS — F3160 Bipolar disorder, current episode mixed, unspecified: Secondary | ICD-10-CM

## 2022-03-06 NOTE — Chronic Care Management (AMB) (Signed)
Chronic Care Management   CCM RN Visit Note  03/06/2022 Name: Shannon [name] MRN: [id number] DOB: 1965/04/06  Subjective: Shannon [name] is a 57 y.o. year old female who is a primary care patient of Shannon [name], [name], NP-C. The patient was referred to the Chronic Care Management team for assistance with care management needs subsequent to provider initiation of CCM services and plan of care.    Today's Visit:  Engaged with patient by telephone for follow up visit.    Goals Addressed             This Visit's Progress    Goal: CCM (Hypertension) Expected Outcome: Monitor, Self-Manage and Reduce Symptoms of Hypertension       Current Barriers:  Chronic Disease Management support and education needs related to Hypertension Lacks caregiver support.  Film/video editor.  Transportation barriers  Planned Interventions: Reviewed symptoms. Denies chest pain, palpitations, or shortness of breath. Denies recent headaches or visual changes. Activity tolerance remains limited d/t neuropathy. Pending start of Home Heath Physical Therapy. Discussed compliance with recommended cardiac prudent diet. Encouraged to read nutrition labels, monitor sodium intake, and avoid highly processed foods when possible. Completed outreach with SW as scheduled on 02/26/22. Informed that resources were not available. Patient does not qualify for Meals on Wheels d/t age requirements. Provided list of local food pantry. Once patient can get assistance ambulating down the stairs, she will attempt to complete needed applications and obtain food at local pantries. Currently has a friend that may be able to assist with bringing groceries and meals. Will keep the team updated. Reviewed s/sx of heart attack, stroke and worsening symptoms that require immediate medical attention. 03/06/22: No changes Symptom Management: Take medications as prescribed   Attend all scheduled provider appointments Call pharmacy for  medication refills 3-7 days in advance of running out of medications Call provider office for new concerns or questions  Continue to work with the care management team to address care coordination needs. Seek immediate medical attention for worsening symptoms         Goal: CCM (Mixed Bipolar Disorder) Expected Outcome:  Monitor, Self-Manage And Reduce Symptoms of Mixed Bipolar Disorder       Current Barriers:  Chronic Disease Management support and education needs related to Mixed Bipolar Disorder Lacks caregiver support.  Film/video editor.  Transportation barriers  Planned Interventions: Reviewed plan for treatment of Mixed Bipolar Disorder.  Reviewed symptoms. Continues to experience chronic fatigue. Reports increased episodes of depression which she attributes to her health and current living situations. Feels that she is managing well with medications and therapy. Denies SI/HI. Reviewed plan regarding options for family support. Reports still not receiving needed financial support from her spouse. Reports having a friend in the area that may be able to assist. Reports maintaining contact with the APS Case Manager regarding plan for obtaining support. Reports discussing plan with current property manager who understands her current situation. Denies concerns regarding risk of eviction. Reviewed safety and fall prevention measures. Denies falls since incident on 02/18/22. Denies urgent care management needs. Thorough discussion regarding worsening s/sx and indications for seeking immediate medical assistance. Update 03/06/22:  Follow up outreach regarding plan for housing. Reports possible need for a letter from PCP regarding current dx and possible need for guardianship. Currently plan is to move in with her cousin that is considering move to [facility].  Discussed need to complete Medicaid application. If approved, she may qualify for [facility]) which she will  likely need  even if moving in with a relative. Pending feedback regarding eligibility for services with [place]. Discussed mobility. Continues to experience neuropathy and reports concerns regarding the pad of her feet.  Thorough discussion regarding continued high risk for falls and importance of using safety recommendations as advised.    Symptom Management: Take medications as prescribed   Attend all scheduled provider appointments Call pharmacy for medication refills 3-7 days in advance of running out of medications Work with the care management team to address care coordination needs and will continue to work with the clinical team to address health care and disease management related needs Call provider office for new concerns or questions  Call the Suicide and Crisis Lifeline: 988 Call the [place] National Suicide Prevention Lifeline: (458)760-0465 or TTY: 865-205-7140 TTY (903)505-9562) to talk to a trained counselor Call 1-800-273-TALK (toll free, 24 hour hotline) Go to [facility] [address], [place] [phone]) Call 911 if experiencing a Mental Health or [place]   Follow Up Plan: Will follow up within the next week              PLAN: Will follow up next week   [place] Manager/Chronic Care Management [phone]

## 2022-03-09 ENCOUNTER — Other Ambulatory Visit: Payer: Self-pay | Admitting: Family Medicine

## 2022-03-09 DIAGNOSIS — R202 Paresthesia of skin: Secondary | ICD-10-CM

## 2022-03-09 MED ORDER — GABAPENTIN 100 MG PO CAPS: 100.0000 mg | ORAL_CAPSULE | Freq: Three times a day (TID) | ORAL | 0 refills | Status: DC

## 2022-03-09 NOTE — Telephone Encounter (Signed)
Pt requesting gabapentin refilled.  Last fill: 01/19/22, 90 tablets 0 refill

## 2022-03-09 NOTE — Telephone Encounter (Signed)
Sent refill to [facility].Marland KitchenJohny [name]

## 2022-03-09 NOTE — Telephone Encounter (Signed)
Caller & Relationship to patient:  Self   Call back number: [phone]  Date of last office visit:  12/29/.2023   Date of next office visit:   Medication(s) to be refilled:  Gabapentin        Preferred Pharmacy:  [facility] on [address]

## 2022-03-10 ENCOUNTER — Ambulatory Visit: Payer: Medicare Other

## 2022-03-10 ENCOUNTER — Telehealth: Payer: Medicare Other

## 2022-03-10 DIAGNOSIS — F3160 Bipolar disorder, current episode mixed, unspecified: Secondary | ICD-10-CM

## 2022-03-10 DIAGNOSIS — Z599 Problem related to housing and economic circumstances, unspecified: Secondary | ICD-10-CM

## 2022-03-10 NOTE — Chronic Care Management (AMB) (Signed)
  Chronic Care Management   CCM RN Visit Note  03/10/2022 Name: Shannon [name] MRN: [id number] DOB: Mar 16, 1965  Subjective: Shannon [name] is a 57 y.o. year old female who is a primary care patient of [name], [name], NP-C. The patient was referred to the Chronic Care Management team for assistance with care management needs subsequent to provider initiation of CCM services and plan of care.    Today's Visit:  Engaged with patient by telephone for follow up visit.       Goals Addressed             This Visit's Progress    Goal: CCM (Mixed Bipolar Disorder) Expected Outcome:  Monitor, Self-Manage And Reduce Symptoms of Mixed Bipolar Disorder       Current Barriers:  Chronic Disease Management support and education needs related to Mixed Bipolar Disorder Lacks caregiver support.  Film/video editor.  Transportation barriers  Planned Interventions: Reviewed plan for treatment of Mixed Bipolar Disorder.  Reviewed symptoms. Continues to experience chronic fatigue. Reports increased episodes of depression which she attributes to her health and current living situations. Feels that she is managing well with medications and therapy. Denies SI/HI. Reviewed plan regarding options for family support. Reports still not receiving needed financial support from her spouse. Reports having a friend in the area that may be able to assist. Reports maintaining contact with the APS Case Manager regarding plan for obtaining support. Reports discussing plan with current property manager who understands her current situation. Denies concerns regarding risk of eviction. Reviewed safety and fall prevention measures. Denies falls since incident on 02/18/22. Denies urgent care management needs. Thorough discussion regarding worsening s/sx and indications for seeking immediate medical assistance. Update 03/10/22:  Discussed plan for housing. Reports not hearing back from her cousin regarding relocation to  [place] to assist. Reports receiving a call from her brother who is willing to assist and resides in [place].  He recommended that she relocate to [place]. Reports current preference if to remain in [place] but understands that limited finances and lengthy waiting list for housing poses a significant barrier.  Agreed to consider options and update the care management and APS team regarding documentation that will be required if she opts to relocate.    Symptom Management: Take medications as prescribed   Attend all scheduled provider appointments Call pharmacy for medication refills 3-7 days in advance of running out of medications Work with the care management team to address care coordination needs and will continue to work with the clinical team to address health care and disease management related needs Call provider office for new concerns or questions  Call the Suicide and Crisis Lifeline: 988 Call the [place] National Suicide Prevention Lifeline: 410-334-2401 or TTY: 315 042 3305 TTY (636)700-7684) to talk to a trained counselor Call 1-800-273-TALK (toll free, 24 hour hotline) Go to [facility] [address] [place] [phone]) Call 911 if experiencing a Mental Health or [place]   Follow Up Plan: Will follow up within the next week            PLAN: Will follow up  within the next week   [place] Management [phone]

## 2022-03-13 ENCOUNTER — Ambulatory Visit: Payer: Self-pay

## 2022-03-13 ENCOUNTER — Telehealth: Payer: Medicare Other

## 2022-03-13 ENCOUNTER — Ambulatory Visit: Payer: Medicare Other

## 2022-03-13 ENCOUNTER — Encounter (HOSPITAL_COMMUNITY): Payer: Self-pay | Admitting: *Deleted

## 2022-03-13 DIAGNOSIS — R5381 Other malaise: Secondary | ICD-10-CM

## 2022-03-13 DIAGNOSIS — F3160 Bipolar disorder, current episode mixed, unspecified: Secondary | ICD-10-CM

## 2022-03-13 NOTE — Chronic Care Management (AMB) (Signed)
Error Please Disregard

## 2022-03-16 ENCOUNTER — Encounter (HOSPITAL_COMMUNITY): Payer: Self-pay | Admitting: Psychiatry

## 2022-03-16 ENCOUNTER — Telehealth (HOSPITAL_BASED_OUTPATIENT_CLINIC_OR_DEPARTMENT_OTHER): Payer: Medicare Other | Admitting: Psychiatry

## 2022-03-16 DIAGNOSIS — F4310 Post-traumatic stress disorder, unspecified: Secondary | ICD-10-CM

## 2022-03-16 DIAGNOSIS — F319 Bipolar disorder, unspecified: Secondary | ICD-10-CM

## 2022-03-16 MED ORDER — QUETIAPINE FUMARATE 25 MG PO TABS: 25.0000 mg | ORAL_TABLET | Freq: Two times a day (BID) | ORAL | 1 refills | Status: DC

## 2022-03-16 MED ORDER — QUETIAPINE FUMARATE 300 MG PO TABS: 300.0000 mg | ORAL_TABLET | Freq: Every day | ORAL | 1 refills | Status: DC

## 2022-03-16 MED ORDER — FLUOXETINE HCL 20 MG PO CAPS: 20.0000 mg | ORAL_CAPSULE | Freq: Every day | ORAL | 1 refills | Status: DC

## 2022-03-16 MED ORDER — CLONAZEPAM 0.5 MG PO TABS: 0.5000 mg | ORAL_TABLET | Freq: Two times a day (BID) | ORAL | 1 refills | Status: DC | PRN
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
## 2022-03-17 ENCOUNTER — Telehealth: Payer: Medicare Other

## 2022-03-18 ENCOUNTER — Telehealth: Payer: Self-pay | Admitting: Family Medicine

## 2022-03-18 NOTE — Telephone Encounter (Signed)
Shannon [name] from [facility] - patient did not feel like doing her start visit until next week.  FYI

## 2022-03-18 NOTE — Telephone Encounter (Signed)
Just an FYI

## 2022-03-20 ENCOUNTER — Ambulatory Visit: Payer: Self-pay

## 2022-03-20 ENCOUNTER — Telehealth: Payer: Medicare Other

## 2022-03-20 DIAGNOSIS — F3160 Bipolar disorder, current episode mixed, unspecified: Secondary | ICD-10-CM

## 2022-03-20 DIAGNOSIS — R5381 Other malaise: Secondary | ICD-10-CM

## 2022-03-20 NOTE — Chronic Care Management (AMB) (Signed)
Chronic Care Management   CCM RN Visit Note  03/20/2022 Name: Shannon [name] MRN: [id number] DOB: 06-19-1965  Subjective: Shannon [name] is a 57 y.o. year old female who is a primary care patient of [name], [name], NP-C. The patient was referred to the Chronic Care Management team for assistance with care management needs subsequent to provider initiation of CCM services and plan of care.    Today's Visit:  Engaged with patient by telephone for follow up visit.       Goals Addressed             This Visit's Progress    Goal: CCM (Hypertension) Expected Outcome: Monitor, Self-Manage and Reduce Symptoms of Hypertension       Current Barriers:  Chronic Disease Management support and education needs related to Hypertension Lacks caregiver support.  Film/video editor.  Transportation barriers  Planned Interventions: Reviewed symptoms. Denies chest pain, palpitations, or shortness of breath. Denies recent headaches or visual changes. Activity tolerance remains limited d/t neuropathy. Pending start of Home [facility] Physical Therapy. Discussed compliance with recommended cardiac prudent diet. Encouraged to read nutrition labels, monitor sodium intake, and avoid highly processed foods when possible. Completed outreach with SW as scheduled on 02/26/22. Informed that resources were not available. Patient does not qualify for Meals on Wheels d/t age requirements. Provided list of local food pantry. Once patient can get assistance ambulating down the stairs, she will attempt to complete needed applications and obtain food at local pantries. Currently has a friend that may be able to assist with bringing groceries and meals. Will keep the team updated. Reviewed s/sx of heart attack, stroke and worsening symptoms that require immediate medical attention. 03/06/22: No changes Symptom Management: Take medications as prescribed   Attend all scheduled provider appointments Call pharmacy for  medication refills 3-7 days in advance of running out of medications Call provider office for new concerns or questions  Continue to work with the care management team to address care coordination needs. Seek immediate medical attention for worsening symptoms   Follow Up Plan:  Will follow up within the next week  f     Goal: CCM (Mixed Bipolar Disorder) Expected Outcome:  Monitor, Self-Manage And Reduce Symptoms of Mixed Bipolar Disorder       Current Barriers:  Chronic Disease Management support and education needs related to Mixed Bipolar Disorder Lacks caregiver support.  Film/video editor.  Transportation barriers  Planned Interventions: Reviewed plan for treatment of Mixed Bipolar Disorder.  Reviewed symptoms. Continues to experience chronic fatigue. Reports increased episodes of depression which she attributes to her health and current living situations. Feels that she is managing well with medications and therapy. Denies SI/HI. Reviewed plan regarding options for family support. Reports still not receiving needed financial support from her spouse. Reports having a friend in the area that may be able to assist. Reports maintaining contact with the APS Case Manager regarding plan for obtaining support. Reports discussing plan with current property manager who understands her current situation. Denies concerns regarding risk of eviction. Reviewed safety and fall prevention measures. Denies falls since incident on 02/18/22. Update 03/20/22:  Follow up regarding plan for physical therapy. Reports communicating with the [facility] team as scheduled on 03/17/22 however she was not feeling well. Will complete start of services next week. Reports doing well today. Denies urgent care management needs. Reviewed indications for seeking medical attention if she experiences worsening symptoms or falls over the weekend.   Symptom Management: Take  medications as prescribed   Attend all  scheduled provider appointments Call pharmacy for medication refills 3-7 days in advance of running out of medications Work with the care management team to address care coordination needs and will continue to work with the clinical team to address health care and disease management related needs Call provider office for new concerns or questions  Call the Suicide and Crisis Lifeline: 988 Call the [place] National Suicide Prevention Lifeline: (562) 318-5277 or TTY: 916 314 0758 TTY (617)682-2384) to talk to a trained counselor Call 1-800-273-TALK (toll free, 24 hour hotline) Go to [facility] [address], [place] [phone]) Call 911 if experiencing a Mental Health or [place]   Follow Up Plan: Will follow up next week             PLAN: Will follow up next week   [place] Manager/Chronic Care Management [phone]

## 2022-03-23 ENCOUNTER — Telehealth: Payer: Self-pay | Admitting: Family Medicine

## 2022-03-23 DIAGNOSIS — R202 Paresthesia of skin: Secondary | ICD-10-CM | POA: Diagnosis not present

## 2022-03-23 DIAGNOSIS — R7303 Prediabetes: Secondary | ICD-10-CM | POA: Diagnosis not present

## 2022-03-23 DIAGNOSIS — R262 Difficulty in walking, not elsewhere classified: Secondary | ICD-10-CM | POA: Diagnosis not present

## 2022-03-23 DIAGNOSIS — I10 Essential (primary) hypertension: Secondary | ICD-10-CM | POA: Diagnosis not present

## 2022-03-23 DIAGNOSIS — R1310 Dysphagia, unspecified: Secondary | ICD-10-CM | POA: Diagnosis not present

## 2022-03-23 DIAGNOSIS — E559 Vitamin D deficiency, unspecified: Secondary | ICD-10-CM | POA: Diagnosis not present

## 2022-03-23 DIAGNOSIS — G5703 Lesion of sciatic nerve, bilateral lower limbs: Secondary | ICD-10-CM | POA: Diagnosis not present

## 2022-03-23 DIAGNOSIS — M1610 Unilateral primary osteoarthritis, unspecified hip: Secondary | ICD-10-CM | POA: Diagnosis not present

## 2022-03-23 DIAGNOSIS — M5136 Other intervertebral disc degeneration, lumbar region: Secondary | ICD-10-CM | POA: Diagnosis not present

## 2022-03-23 DIAGNOSIS — G4700 Insomnia, unspecified: Secondary | ICD-10-CM | POA: Diagnosis not present

## 2022-03-23 DIAGNOSIS — R1013 Epigastric pain: Secondary | ICD-10-CM | POA: Diagnosis not present

## 2022-03-23 DIAGNOSIS — G894 Chronic pain syndrome: Secondary | ICD-10-CM | POA: Diagnosis not present

## 2022-03-23 DIAGNOSIS — K449 Diaphragmatic hernia without obstruction or gangrene: Secondary | ICD-10-CM | POA: Diagnosis not present

## 2022-03-23 DIAGNOSIS — F17210 Nicotine dependence, cigarettes, uncomplicated: Secondary | ICD-10-CM | POA: Diagnosis not present

## 2022-03-23 DIAGNOSIS — K219 Gastro-esophageal reflux disease without esophagitis: Secondary | ICD-10-CM | POA: Diagnosis not present

## 2022-03-23 DIAGNOSIS — K5900 Constipation, unspecified: Secondary | ICD-10-CM | POA: Diagnosis not present

## 2022-03-23 DIAGNOSIS — E785 Hyperlipidemia, unspecified: Secondary | ICD-10-CM | POA: Diagnosis not present

## 2022-03-23 DIAGNOSIS — D509 Iron deficiency anemia, unspecified: Secondary | ICD-10-CM | POA: Diagnosis not present

## 2022-03-23 DIAGNOSIS — E876 Hypokalemia: Secondary | ICD-10-CM | POA: Diagnosis not present

## 2022-03-23 DIAGNOSIS — M19011 Primary osteoarthritis, right shoulder: Secondary | ICD-10-CM | POA: Diagnosis not present

## 2022-03-23 NOTE — Telephone Encounter (Signed)
Ok for verbal orders ?

## 2022-03-23 NOTE — Telephone Encounter (Signed)
Mitzi Hansen from [facility] called requesting verbal orders for the patient to have Physical Therapy 1 time a week for 9 weeks. Best callback number for Mitzi Hansen is [phone].

## 2022-03-24 ENCOUNTER — Ambulatory Visit: Payer: Medicare Other | Admitting: Neurology

## 2022-03-24 NOTE — Telephone Encounter (Signed)
Called and gave ok for verbal orders to [place]

## 2022-03-25 DIAGNOSIS — I10 Essential (primary) hypertension: Secondary | ICD-10-CM

## 2022-03-25 DIAGNOSIS — F3160 Bipolar disorder, current episode mixed, unspecified: Secondary | ICD-10-CM

## 2022-03-26 ENCOUNTER — Telehealth: Payer: Self-pay | Admitting: Family Medicine
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
## 2022-03-26 NOTE — Telephone Encounter (Signed)
Called pt and she has made the decision that she would like to move back to [place] so that she can be closer to her family and have further family support with her health conditions. She is already familiar with the area as she used to live there and already has plans to return to her old care team there with psychiatrist, PCP and pain doctor. She would like to move into an apartment complex that has an Media planner and is primarily for senior living, however they only take ages 108+ unless they are disabled. Patient has already been speaking with her Adult Materials engineer and they agree to this and all they would need is a letter from pt's PCP detailing the extent of her disabilities. She would like to make sure we at least have her Bipolar and Neuropathy diagnosis on there to make sure they are aware of her issues walking and going up stairs. Would you like to move forward with this?   Name of Facility and information:  [facility]  [address], [place] [address] Phone: [phone]  Fax: [phone]

## 2022-03-27 ENCOUNTER — Ambulatory Visit (INDEPENDENT_AMBULATORY_CARE_PROVIDER_SITE_OTHER): Payer: Medicare Other

## 2022-03-27 DIAGNOSIS — F3160 Bipolar disorder, current episode mixed, unspecified: Secondary | ICD-10-CM

## 2022-03-27 DIAGNOSIS — Z599 Problem related to housing and economic circumstances, unspecified: Secondary | ICD-10-CM

## 2022-03-27 NOTE — Chronic Care Management (AMB) (Signed)
  Chronic Care Management   CCM RN Visit Note  03/27/2022 Name: Shannon [name] MRN: [id number] DOB: May 30, 1965  Subjective: Shannon [name] is a 57 y.o. year old female who is a primary care patient of [name], [name], NP-C. The patient was referred to the Chronic Care Management team for assistance with care management needs subsequent to provider initiation of CCM services and plan of care.    Today's Visit:  Engaged with patient by telephone for follow up visit.       Goals Addressed             This Visit's Progress               Goal: CCM (Mixed Bipolar Disorder) Expected Outcome:  Monitor, Self-Manage And Reduce Symptoms of Mixed Bipolar Disorder       Current Barriers:  Chronic Disease Management support and education needs related to Mixed Bipolar Disorder Lacks caregiver support.  Film/video editor.  Transportation barriers  Planned Interventions: Reviewed plan for treatment of Mixed Bipolar Disorder. Report compliance with plan and taking medications as prescribed.  Reports improvements with overall mood. Attempting to remain motivated. Denies SI/HI Discussed plan for housing and family support. Reports her brother has requested that she relocate to [place]. Reports this will allow her to be closer to family and have additional support if needed. Reports [facility] with the staff at [facility] and Income restricted residence. Reports being informed that she may qualify if she is able to provide documentation verifying her disability and need for a handicap accessible unit. She has contacted the clinic to request the documentation. Will follow up within the week.  Symptom Management: Take medications as prescribed   Attend all scheduled provider appointments Call pharmacy for medication refills 3-7 days in advance of running out of medications Work with the care management team to address care coordination needs and will continue to work with the clinical  team to address health care and disease management related needs Call provider office for new concerns or questions  Call the Suicide and Crisis Lifeline: 988 Call the [place] National Suicide Prevention Lifeline: 780-310-0485 or TTY: 361-137-4045 TTY (507)785-2569) to talk to a trained counselor Call 1-800-273-TALK (toll free, 24 hour hotline) Go to [facility] [address] [place] [phone]) Call 911 if experiencing a Mental Health or [place]   Follow Up Plan: Will follow up within the next week             PLAN: A member of the care management team will follow up within the next week.  Horris Latino RN Care Manager/Chronic Care Management [phone]

## 2022-03-30 ENCOUNTER — Ambulatory Visit: Payer: Self-pay

## 2022-03-30 DIAGNOSIS — R7303 Prediabetes: Secondary | ICD-10-CM | POA: Diagnosis not present

## 2022-03-30 DIAGNOSIS — K5900 Constipation, unspecified: Secondary | ICD-10-CM | POA: Diagnosis not present

## 2022-03-30 DIAGNOSIS — K219 Gastro-esophageal reflux disease without esophagitis: Secondary | ICD-10-CM | POA: Diagnosis not present

## 2022-03-30 DIAGNOSIS — D509 Iron deficiency anemia, unspecified: Secondary | ICD-10-CM | POA: Diagnosis not present

## 2022-03-30 DIAGNOSIS — R202 Paresthesia of skin: Secondary | ICD-10-CM | POA: Diagnosis not present

## 2022-03-30 DIAGNOSIS — F3160 Bipolar disorder, current episode mixed, unspecified: Secondary | ICD-10-CM

## 2022-03-30 DIAGNOSIS — F17210 Nicotine dependence, cigarettes, uncomplicated: Secondary | ICD-10-CM | POA: Diagnosis not present

## 2022-03-30 DIAGNOSIS — E876 Hypokalemia: Secondary | ICD-10-CM | POA: Diagnosis not present

## 2022-03-30 DIAGNOSIS — K449 Diaphragmatic hernia without obstruction or gangrene: Secondary | ICD-10-CM | POA: Diagnosis not present

## 2022-03-30 DIAGNOSIS — E559 Vitamin D deficiency, unspecified: Secondary | ICD-10-CM | POA: Diagnosis not present

## 2022-03-30 DIAGNOSIS — G894 Chronic pain syndrome: Secondary | ICD-10-CM | POA: Diagnosis not present

## 2022-03-30 DIAGNOSIS — Z599 Problem related to housing and economic circumstances, unspecified: Secondary | ICD-10-CM

## 2022-03-30 DIAGNOSIS — M1610 Unilateral primary osteoarthritis, unspecified hip: Secondary | ICD-10-CM | POA: Diagnosis not present

## 2022-03-30 DIAGNOSIS — M19011 Primary osteoarthritis, right shoulder: Secondary | ICD-10-CM | POA: Diagnosis not present

## 2022-03-30 DIAGNOSIS — R1013 Epigastric pain: Secondary | ICD-10-CM | POA: Diagnosis not present

## 2022-03-30 DIAGNOSIS — E785 Hyperlipidemia, unspecified: Secondary | ICD-10-CM | POA: Diagnosis not present

## 2022-03-30 DIAGNOSIS — G5703 Lesion of sciatic nerve, bilateral lower limbs: Secondary | ICD-10-CM | POA: Diagnosis not present

## 2022-03-30 DIAGNOSIS — M5136 Other intervertebral disc degeneration, lumbar region: Secondary | ICD-10-CM | POA: Diagnosis not present

## 2022-03-30 DIAGNOSIS — G4700 Insomnia, unspecified: Secondary | ICD-10-CM | POA: Diagnosis not present

## 2022-03-30 DIAGNOSIS — R1310 Dysphagia, unspecified: Secondary | ICD-10-CM | POA: Diagnosis not present

## 2022-03-30 DIAGNOSIS — I10 Essential (primary) hypertension: Secondary | ICD-10-CM | POA: Diagnosis not present

## 2022-03-30 DIAGNOSIS — R262 Difficulty in walking, not elsewhere classified: Secondary | ICD-10-CM | POA: Diagnosis not present

## 2022-03-30 NOTE — Chronic Care Management (AMB) (Signed)
  Chronic Care Management   CCM RN Visit Note  03/30/2022 Name: Shannon [name] MRN: [id number] DOB: 17-May-1965  Subjective: Shannon [name] is a 57 y.o. year old female who is a primary care patient of Shannon [name], Shannon L, NP-C. The patient was referred to the Chronic Care Management team for assistance with care management needs subsequent to provider initiation of CCM services and plan of care.    Today's Visit:  Follow up visit regarding housing and addition of home health services     Goals Addressed             This Visit's Progress    Goal: CCM (Mixed Bipolar Disorder) Expected Outcome:  Monitor, Self-Manage And Reduce Symptoms of Mixed Bipolar Disorder       Current Barriers:  Chronic Disease Management support and education needs related to Mixed Bipolar Disorder Lacks caregiver support.  Film/video editor.  Transportation barriers  Planned Interventions: Reviewed plan for treatment of Mixed Bipolar Disorder. Report compliance with plan and taking medications as prescribed.  Reports improvements with overall mood. Attempting to remain motivated. Denies SI/HI Discussed plan for housing and family support. Reports her brother has requested that she relocate to [place]. Reports this will allow her to be closer to family and have additional support if needed. Reports speaking with the staff at [facility] restricted residence. Reports being informed that she may qualify if she is able to provide documentation verifying her disability and need for a handicap accessible unit. She has contacted the clinic to request the documentation. Will follow up within the week. Update 03/30/22:  Contacted RNCM to provide info for [facility]. Requesting that team contact Shannon [name] at [facility] to confirm letter is received.  Requesting that Shannon [name] be contacted at [phone]  Fax: [phone]. Requesting addition of home health services.  [place]  and PCP regarding request for addition of Occupational Therapy services.   Symptom Management: Take medications as prescribed   Attend all scheduled provider appointments Call pharmacy for medication refills 3-7 days in advance of running out of medications Work with the care management team to address care coordination needs and will continue to work with the clinical team to address health care and disease management related needs Call provider office for new concerns or questions  Call the Suicide and Crisis Lifeline: 988 Call the [place] National Suicide Prevention Lifeline: (406) 660-9389 or TTY: 385-792-3540 TTY (581)066-8929) to talk to a trained counselor Call 1-800-273-TALK (toll free, 24 hour hotline) Go to [facility] [address] [place] [phone]) Call 911 if experiencing a Mental Health or [place]   Follow Up Plan: Will follow up within the next week            PLAN Will follow up within the next week.   Horris Latino RN Care Manager/Chronic Care Management [phone]

## 2022-03-31 ENCOUNTER — Other Ambulatory Visit: Payer: Self-pay

## 2022-03-31 ENCOUNTER — Encounter: Payer: Self-pay | Admitting: Internal Medicine

## 2022-03-31 ENCOUNTER — Ambulatory Visit: Payer: Self-pay

## 2022-03-31 DIAGNOSIS — R262 Difficulty in walking, not elsewhere classified: Secondary | ICD-10-CM

## 2022-03-31 DIAGNOSIS — R29898 Other symptoms and signs involving the musculoskeletal system: Secondary | ICD-10-CM

## 2022-03-31 DIAGNOSIS — I10 Essential (primary) hypertension: Secondary | ICD-10-CM

## 2022-03-31 DIAGNOSIS — R5381 Other malaise: Secondary | ICD-10-CM

## 2022-03-31 DIAGNOSIS — R200 Anesthesia of skin: Secondary | ICD-10-CM

## 2022-03-31 DIAGNOSIS — R202 Paresthesia of skin: Secondary | ICD-10-CM

## 2022-03-31 DIAGNOSIS — F3160 Bipolar disorder, current episode mixed, unspecified: Secondary | ICD-10-CM

## 2022-03-31 NOTE — Chronic Care Management (AMB) (Signed)
  Chronic Care Management   CCM RN Visit Note   Name: Shannon [name] MRN: [id number] DOB: 08-05-1965  Subjective: Shannon [name] is a 57 y.o. year old female who is a primary care patient of [name], [name], NP-C. The patient was referred to the Chronic Care Management team for assistance with care management needs subsequent to provider initiation of CCM services and plan of care.    Today's Visit:  Engaged with patient by telephone for follow up visit.        Goals Addressed             This Visit's Progress    Goal: CCM (Mixed Bipolar Disorder) Expected Outcome:  Monitor, Self-Manage And Reduce Symptoms of Mixed Bipolar Disorder       Current Barriers:  Chronic Disease Management support and education needs related to Mixed Bipolar Disorder Lacks caregiver support.  Film/video editor.  Transportation barriers  Planned Interventions: Reviewed plan for treatment of Mixed Bipolar Disorder.  Reviewed symptoms. Continues to experience chronic fatigue. Reports increased episodes of depression which she attributes to her health and current living situations. Feels that she is managing well with medications and therapy. Denies SI/HI. Reviewed plan regarding options for family support. Reports still not receiving needed financial support from her spouse. Reports having a friend in the area that may be able to assist. Reports maintaining contact with the APS Case Manager regarding plan for obtaining support. Reports discussing plan with current property manager who understands her current situation. Denies concerns regarding risk of eviction. Reviewed safety and fall prevention measures. Denies falls since incident on 02/18/22. Update 03/13/22:  Discussed plan r/t neuropathy, limited mobility and start of home health services.  Discussed anticipated start date for physical therapy services on 03/17/22. The [facility] team has messaged the clinic indicating plan to disengage if  they are unable to reach her on 1/23. They have made several attempts to reach her in the past. Strongly advised to answer call on 1/23 to ensure she is able to start therapy as planned. Denies urgent care management needs. Thorough discussion regarding worsening s/sx and indications for seeking immediate medical assistance.   Symptom Management: Take medications as prescribed   Attend all scheduled provider appointments Call pharmacy for medication refills 3-7 days in advance of running out of medications Work with the care management team to address care coordination needs and will continue to work with the clinical team to address health care and disease management related needs Call provider office for new concerns or questions  Call the Suicide and Crisis Lifeline: 988 Call the [place] National Suicide Prevention Lifeline: (571) 371-9783 or TTY: (603)298-6257 TTY (321)082-2379) to talk to a trained counselor Call 1-800-273-TALK (toll free, 24 hour hotline) Go to [facility] [address], [place] [phone]) Call 911 if experiencing a Mental Health or [place]   Follow Up Plan: Will follow up next week           PLAN: A member of the care management team will follow up next week.   Horris Latino RN Care Manager/Chronic Care Management [phone]

## 2022-03-31 NOTE — Progress Notes (Signed)
OT and nurse aid for assistance with showers/baths order placed

## 2022-04-01 ENCOUNTER — Ambulatory Visit: Payer: Self-pay

## 2022-04-01 DIAGNOSIS — I10 Essential (primary) hypertension: Secondary | ICD-10-CM

## 2022-04-01 NOTE — Chronic Care Management (AMB) (Signed)
  Chronic Care Management   CCM RN Visit Note  04/01/2022 Name: Shannon [name] MRN: [id number] DOB: [birth year]  Subjective: Shannon [name] is a 57 y.o. year old female who is a primary care patient of Suezanne Jacquet, [name], NP-C. The patient was referred to the Chronic Care Management team for assistance with care management needs subsequent to provider initiation of CCM services and plan of care.     Today's Visit:  Engaged with patient by telephone for follow up visit.       Goals Addressed             This Visit's Progress    Goal: CCM (Hypertension) Expected Outcome: Monitor, Self-Manage and Reduce Symptoms of Hypertension       Current Barriers:  Chronic Disease Management support and education needs related to Hypertension Lacks caregiver support.  Corporate treasurer.  Transportation barriers  Planned Interventions: Reviewed plan for management of hypertension. Reports taking medications as prescribed. Continues to receive medication delivery from [facility]. Reviewed blood pressure parameters. Reports BP is being monitored during home health PT visits. Reports readings have been within range. Advised to maintain a log of readings to identify trends. Reviewed parameters and indications for notifying a provider. Reviewed symptoms. Denies chest pain, palpitations, or shortness of breath. Denies recent headaches or visual changes. Continues to experience fatigue which she notes is chronic. Activity tolerance remains limited d/t neuropathy.  Discussed nutritional intake. Reports decrease in appetite but otherwise doing well with nutritional intake. Attempting to maintain adequate hydration and focusing on intake of vegetables and lean proteins. Reviewed s/sx of heart attack, stroke and worsening symptoms that require immediate medical attention. Update 04/01/26:  Reports inability to tolerate solid foods today d/t acid reflux and decreased appetite. Denies chest pain or palpitations. Denies  vomiting. Tolerating fluids but reports minimal intake.  Virtual visit scheduled with PCP on 04/02/22. Thorough review of worsening s/sx that require immediate medical attention.   Symptom Management: Take medications as prescribed   Attend all scheduled provider appointments Call pharmacy for medication refills 3-7 days in advance of running out of medications Call provider office for new concerns or questions  Continue to work with the care management team to address care coordination needs. Seek immediate medical attention for worsening symptoms   Follow Up Plan:  Will update next month        PLAN: Will follow up within the next week    Katha Cabal RN Care Manager/Chronic Care Management [phone]

## 2022-04-02 ENCOUNTER — Encounter: Payer: Self-pay | Admitting: Family Medicine

## 2022-04-02 ENCOUNTER — Telehealth (INDEPENDENT_AMBULATORY_CARE_PROVIDER_SITE_OTHER): Payer: Medicare Other | Admitting: Family Medicine

## 2022-04-02 DIAGNOSIS — K219 Gastro-esophageal reflux disease without esophagitis: Secondary | ICD-10-CM | POA: Diagnosis not present

## 2022-04-02 DIAGNOSIS — R1310 Dysphagia, unspecified: Secondary | ICD-10-CM | POA: Diagnosis not present

## 2022-04-02 NOTE — Progress Notes (Signed)
MyChart Video Visit    Virtual Visit via Video Note   This visit type was conducted due to national recommendations for restrictions regarding the COVID-19 Pandemic (e.g. social distancing) in an effort to limit this patient's exposure and mitigate transmission in our community. This patient is at least at moderate risk for complications without adequate follow up. This format is felt to be most appropriate for this patient at this time. Physical exam was limited by quality of the video and audio technology used for the visit. CMA was able to get the patient set up on a video visit.  Patient location: Home. Patient and provider in visit Provider location: Office  I discussed the limitations of evaluation and management by telemedicine and the availability of in person appointments. The patient expressed understanding and agreed to proceed.  Visit Date: 04/02/2022  Today's healthcare provider: Harland Dingwall, NP-C     Subjective:    Patient ID: Shannon [name], female    DOB: [birth year], 57 y.o.   MRN: [id number]  Chief Complaint  Patient presents with   poor appetite    When she does try to eat it feels as if she is gonna throw up. Was able to eat some saltines last night. Has been talking to someone about how to get protein in her diet (protein drinks) but has not tried yet. Has been told about OTC nausea medication.     HPI  States she is having issues with swallowing her food.  Reports worsening reflux.  States she is taking pantoprazole daily.  She is eating while leaning back/laying down and is not sitting upright.   No pain with swallowing or food getting stuck.   She is scheduled for EGD and colonoscopy next week.   No fever, chills, chest pain, shortness of breath.   Requesting a letter to help her gain entry into a living facility in [place] where her family lives.  Give letter to [facility].  Emphasize physical disabilities.   Past Medical History:   Diagnosis Date   Allergic rhinitis    Allergic rhinitis 01/25/2009        ANEMIA-IRON DEFICIENCY 01/25/2009   Anxiety    Backache 01/25/2009   Qualifier: Diagnosis of  By: Jenny Reichmann MD, Hunt Oris    Bipolar 1 disorder [facility])    Chronic pain syndrome    Complete rotator cuff tear 07/21/2013   Complete tear of right rotator cuff 07/21/2013   Contact lens/glasses fitting    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   [place] DISEASE, LUMBAR 01/25/2009   Essential hypertension 32/03/252   On Bystolic    GAD (generalized anxiety disorder) 12/28/2013   GERD (gastroesophageal reflux disease)    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   Headache, acute 12/20/2013   Hx of laparoscopic gastric banding 09/03/2010   Surgery date: 07/17/10    HYPERLIPIDEMIA 01/25/2009   Hyperlipidemia with target LDL less than 130 01/25/2009   HYPERTENSION 01/25/2009   Hypokalemia, inadequate intake 09/01/2011   Insomnia 04/04/2011   INSOMNIA-SLEEP DISORDER-UNSPEC 04/30/2009   Qualifier: Diagnosis of  By: Jenny Reichmann MD, Hunt Oris    Iron deficiency anemia 01/25/2009        Labyrinthitis 02/19/2014   Leiomyoma of uterus, unspecified 08/13/2008   Overview:  Leiomyoma Of The Uterus  10/1 IMO update   Localized edema 06/12/2015   MANIC DEPRESSIVE ILLNESS 01/25/2009   pt is unsue if this is her specifc dx   Mixed bipolar I disorder ([place]) 07/12/2012   Morbid  obesity ([place]) 01/25/2009   Night sweats    Osteopenia determined by x-ray 06/13/2021   Other abnormal glucose 10/26/2012   Palpitations 10/25/2013   PEPTIC ULCER DISEASE, HELICOBACTER PYLORI POSITIVE 10/03/2009   Peripheral edema 06/25/2015   Pernicious anemia 03/28/2012   Piriformis syndrome of both sides 09/02/2018   PUD (peptic ulcer disease) 10/03/2009        Right shoulder pain 05/03/2013   Dg Shoulder Right  05/03/2013   CLINICAL DATA Pain.  EXAM RIGHT SHOULDER - 2+ VIEW  COMPARISON None.  FINDINGS Acromioclavicular and glenohumeral degenerative change present. Questionable calcific density  noted in the region of the supraspinatus space, possibly representing calcific supraspinatus tendinitis. This could represent a sclerotic density in the acromion. MRI of the right shoulder suggest   Sleep apnea 08/12/2016   SMOKER 01/25/2009   Qualifier: Diagnosis of  By: Jenny Reichmann MD, Hunt Oris    Subacromial bursitis 05/17/2013   SUBSTANCE ABUSE 11/06/2009        Thiamin deficiency 10/30/2012   Tobacco abuse 06/09/2010   Visual disturbance 05/03/2013   Vitamin D deficiency 10/27/2012    Past Surgical History:  Procedure Laterality Date   ABDOMINAL HYSTERECTOMY     BLADDER SURGERY     s/p with ?diverticulitis   HAND TENDON SURGERY  1991   s/p-Right-index and middle   LAPAROSCOPIC GASTRIC BAND REMOVAL WITH LAPAROSCOPIC GASTRIC SLEEVE RESECTION N/A 08/17/2016   Procedure: LAPAROSCOPIC GASTRIC BAND REMOVAL WITH LAPAROSCOPIC GASTRIC SLEEVE RESECTION WITH UPPER ENDO;  Surgeon: Alphonsa Overall, MD;  Location: [facility];  Service: General;  Laterality: N/A;   LAPAROSCOPIC GASTRIC BANDING  07/14/10   SHOULDER ARTHROSCOPY Right 07/21/2013   Procedure: RIGHT ARTHROSCOPY SHOULDER DEBRIDMENT EXTENTSIVE,ARTHROSCOPIC REMOVE LOOSE FOREIGN BODY, BICEPS TENOLYSIS ;  Surgeon: Johnny Bridge, MD;  Location: [place];  Service: Orthopedics;  Laterality: Right;   TUBAL LIGATION      Family History  Problem Relation Age of Onset   Hypertension Mother    Asthma Mother    Stroke Father    Cirrhosis Father        ETOH   Hypertension Father    Diabetes Father    Alcohol abuse Father    Hypertension Sister    Asthma Sister    Hypertension Brother    ADD / ADHD Son    Hypertension Paternal Aunt    Diabetes Maternal Grandmother    ADD / ADHD Other        3 nephews, also emotional issues   Diabetes Other        Uncle   Diabetes Other        Aunt   Suicidality Neg Hx     Social History   Socioeconomic History   Marital status: Married    Spouse name: Not on file   Number of children: 2   Years  of education: Not on file   Highest education level: Not on file  Occupational History   Occupation: STUDENT    Employer: UNEMPLOYED  Tobacco Use   Smoking status: Every Day    Packs/day: 0.25    Years: 20.00    Total pack years: 5.00    Types: Cigarettes   Smokeless tobacco: Never   Tobacco comments:    States has cut back to 5-7 a day and working on this.   Vaping Use   Vaping Use: Never used  Substance and Sexual Activity   Alcohol use: No    Alcohol/week: 0.0 standard drinks of alcohol  Drug use: No   Sexual activity: Not Currently  Other Topics Concern   Not on file  Social History Narrative   Moved from [place] to [place] 2009   2 children-1 boy, 1 girl   Disabled-bipolar   Daily Caffeine Use-1 cup/day   Social Determinants of Health   Financial Resource Strain: High Risk (01/23/2022)   Overall Financial Resource Strain (CARDIA)    Difficulty of Paying Living Expenses: Very hard  Food Insecurity: Food Insecurity Present (02/26/2022)   Hunger Vital Sign    Worried About Running Out of Food in the Last Year: Sometimes true    Ran Out of Food in the Last Year: Sometimes true  Transportation Needs: Unmet Transportation Needs (02/26/2022)   PRAPARE - Hydrologist (Medical): Yes    Lack of Transportation (Non-Medical): Yes  Physical Activity: Inactive (02/18/2022)   Exercise Vital Sign    Days of Exercise per Week: 0 days    Minutes of Exercise per Session: 0 min  Stress: Stress Concern Present (02/18/2022)   [place]    Feeling of Stress : Rather much  Social Connections: Moderately Isolated (10/07/2021)   Social Connection and Isolation Panel [NHANES]    Frequency of Communication with Friends and Family: Three times a week    Frequency of Social Gatherings with Friends and Family: Never    Attends Religious Services: Never    Marine scientist or  Organizations: No    Attends Archivist Meetings: Never    Marital Status: Married  Human resources officer Violence: Not At Risk (10/07/2021)   Humiliation, Afraid, Rape, and Kick questionnaire    Fear of Current or Ex-Partner: No    Emotionally Abused: No    Physically Abused: No    Sexually Abused: No    Outpatient Medications Prior to Visit  Medication Sig Dispense Refill   atorvastatin (LIPITOR) 20 MG tablet Take 1 tablet (20 mg total) by mouth daily. 90 tablet 1   clonazePAM (KLONOPIN) 0.5 MG tablet Take 1 tablet (0.5 mg total) by mouth 2 (two) times daily as needed for anxiety. 60 tablet 1   FLUoxetine (PROZAC) 20 MG capsule Take 1 capsule (20 mg total) by mouth daily. 30 capsule 1   gabapentin (NEURONTIN) 100 MG capsule Take 1 capsule (100 mg total) by mouth 3 (three) times daily. 90 capsule 0   Multiple Vitamins-Minerals (BARIATRIC MULTIVITAMINS/IRON) CAPS Take 1 capsule by mouth daily. 30 capsule 2   pantoprazole (PROTONIX) 40 MG tablet Take 1 tablet (40 mg total) by mouth 2 (two) times daily before a meal. 180 tablet 0   polyethylene glycol powder (GLYCOLAX/MIRALAX) 17 GM/SCOOP powder Take entire bottle in 32 ounces of sports drink as directed 119 g 0   potassium chloride SA (KLOR-CON M) 20 MEQ tablet Take 1 tablet (20 mEq total) by mouth daily. 5 tablet 0   QUEtiapine (SEROQUEL) 25 MG tablet Take 1 tablet (25 mg total) by mouth 2 (two) times daily. 60 tablet 1   QUEtiapine (SEROQUEL) 300 MG tablet Take 1 tablet (300 mg total) by mouth at bedtime. 30 tablet 1   amLODipine (NORVASC) 5 MG tablet Take 1 tablet (5 mg total) by mouth daily. (Patient not taking: Reported on 02/19/2022) 30 tablet 2   ARIPiprazole (ABILIFY) 5 MG tablet Take 1 tablet (5 mg total) by mouth daily. (Patient not taking: Reported on 02/19/2022) 30 tablet 1   cyclobenzaprine (  FLEXERIL) 10 MG tablet Take 1 tablet (10 mg total) by mouth at bedtime as needed for muscle spasms. (Patient not taking: Reported on  02/19/2022) 15 tablet 0   linaclotide (LINZESS) 145 MCG CAPS capsule Take 1 capsule (145 mcg total) by mouth daily before breakfast. (Patient not taking: Reported on 02/19/2022) 30 capsule 2   meloxicam (MOBIC) 15 MG tablet Take 1 tablet (15 mg total) by mouth daily as needed for pain. (Patient not taking: Reported on 02/19/2022) 14 tablet 0   tiZANidine (ZANAFLEX) 4 MG tablet Take 1 tablet (4 mg total) by mouth at bedtime. (Patient not taking: Reported on 02/19/2022) 14 tablet 0   No facility-administered medications prior to visit.    Allergies  Allergen Reactions   Lamictal [Lamotrigine] Rash    ROS     Objective:    Physical Exam  There were no vitals taken for this visit. Wt Readings from Last 3 Encounters:  02/18/22 241 lb (109.3 kg)  02/12/22 241 lb 8 oz (109.5 kg)  12/25/21 282 lb (127.9 kg)   Alert and oriented and in no distress.  She is leaning propped up in her bed during the visit.  Respirations unlabored.  Speaking in complete sentences without difficulty.  Normal speech and mood.     Assessment & Plan:   Problem List Items Addressed This Visit       Digestive   Dysphagia   GERD (gastroesophageal reflux disease) - Primary   Advised her against laying down or even propped up in the bed while eating.  Discussed the need to sit upright when eating or even for 1 to 2 hours after eating due to increased risk for acid reflux.  Continue pantoprazole. She has an appointment for EGD and colonoscopy next week.  I will write a letter stating patient needs assistance with ADLs and is unable to live independently due to chronic health conditions so that she can move into an assisted living facility in [place].  She request that I give this letter to [facility] who is working on this with her. Discussed that I think this will be a good thing for her that she seems happy talking about the move.  I am having Shannon [name] maintain her amLODipine, tiZANidine,  cyclobenzaprine, ARIPiprazole, Bariatric Multivitamins/Iron, meloxicam, pantoprazole, linaclotide, potassium chloride SA, atorvastatin, polyethylene glycol powder, gabapentin, FLUoxetine, QUEtiapine, QUEtiapine, and clonazePAM.  No orders of the defined types were placed in this encounter.   I discussed the assessment and treatment plan with the patient. The patient was provided an opportunity to ask questions and all were answered. The patient agreed with the plan and demonstrated an understanding of the instructions.   The patient was advised to call back or seek an in-person evaluation if the symptoms worsen or if the condition fails to improve as anticipated.  I provided 18 minutes of face-to-face time during this encounter.   Harland Dingwall, NP-C [facility] [phone] (phone) [phone] (fax)  [place]

## 2022-04-03 ENCOUNTER — Ambulatory Visit: Payer: Self-pay

## 2022-04-03 ENCOUNTER — Telehealth: Payer: Self-pay

## 2022-04-03 DIAGNOSIS — F3160 Bipolar disorder, current episode mixed, unspecified: Secondary | ICD-10-CM

## 2022-04-03 NOTE — Telephone Encounter (Signed)
Letter has been faxed to [facility] as requested at Fax# [phone]. Confirmation fax received.   A copy of letter has been placed in Media tab as well.

## 2022-04-03 NOTE — Telephone Encounter (Signed)
Letter was edited and took out independent part- refaxed to [facility]. Confirmation fax received and notified Felicia.

## 2022-04-03 NOTE — Telephone Encounter (Signed)
Patient states that letter does not need to state that she can not live independently because this facility is an independent living facility.

## 2022-04-03 NOTE — Telephone Encounter (Signed)
Received documentation from [facility] regarding High resting heart rate of 114 documented on 03/30/2022.   Per [name]: "I just received information. She was not taking her BP medications consistently. She did not mention this as a concern during VV."

## 2022-04-03 NOTE — Chronic Care Management (AMB) (Signed)
  Chronic Care Management   CCM RN Visit Note  04/03/2022 Name: Shannon [name] MRN: [id number] DOB: 09/16/1965  Subjective: Shannon [name] is a 57 y.o. year old female who is a primary care patient of Suezanne Jacquet, [name], NP-C. The patient was referred to the Chronic Care Management team for assistance with care management needs subsequent to provider initiation of CCM services and plan of care.    Today's Visit:  Engaged with patient by telephone for follow up visit.     Goals Addressed             This Visit's Progress    Goal: CCM (Mixed Bipolar Disorder) Expected Outcome:  Monitor, Self-Manage And Reduce Symptoms of Mixed Bipolar Disorder       Current Barriers:  Chronic Disease Management support and education needs related to Mixed Bipolar Disorder Lacks caregiver support.  Corporate treasurer.  Transportation barriers  Planned Interventions: Reviewed plan for treatment of Mixed Bipolar Disorder. Reports taking medications as prescribed. Reviewed pending appointments with [name]. Reports improvement with mood and doing "ok" today.  Requesting assistance with completing applications. Reports difficulty holding pens/pencils d/t neuropathy. Also having trouble completing ADLs. Requesting a home health nurse aide if available. Referral was submitted for addition of Occupational Therapy and Nursing Aide home health services. Update 04/03/22: Discussed plan for housing and family support. Pending feedback from [employer] in [place]) regarding application requirements. Reviewed pending appointments. Pending appointment with the Neurology team on 04/07/22. Pending EGD and colonoscopy on 04/08/22. Discussed need for transportation. She qualifies for transportation assistance. However, the drivers are not allowed to assist with stair ambulation. She is currently unable to ambulate up and down the stairs without assistance which has been an ongoing barrier. Reports she will  contact her spouse to transport her to these appointments. Reviewed safety and fall prevention measures.   Symptom Management: Take medications as prescribed   Attend all scheduled provider appointments Call pharmacy for medication refills 3-7 days in advance of running out of medications Work with the care management team to address care coordination needs and will continue to work with the clinical team to address health care and disease management related needs Call provider office for new concerns or questions  Call the Suicide and Crisis Lifeline: 988 Call the [place] National Suicide Prevention Lifeline: (734) 812-8381 or TTY: (838)878-6974 TTY (914) 213-1186) to talk to a trained counselor Call 1-800-273-TALK (toll free, 24 hour hotline) Go to [facility] [address], [place] [phone]) Call 911 if experiencing a Mental Health or Behavioral Health Crisis   Follow Up Plan: Will follow up within the next week             PLAN: Will follow up within the next week.   Katha Cabal RN Care Manager/Chronic Care Management [phone]

## 2022-04-06 ENCOUNTER — Ambulatory Visit: Payer: Self-pay

## 2022-04-06 ENCOUNTER — Telehealth: Payer: Self-pay | Admitting: Family Medicine

## 2022-04-06 DIAGNOSIS — R262 Difficulty in walking, not elsewhere classified: Secondary | ICD-10-CM | POA: Diagnosis not present

## 2022-04-06 DIAGNOSIS — R202 Paresthesia of skin: Secondary | ICD-10-CM | POA: Diagnosis not present

## 2022-04-06 DIAGNOSIS — K219 Gastro-esophageal reflux disease without esophagitis: Secondary | ICD-10-CM | POA: Diagnosis not present

## 2022-04-06 DIAGNOSIS — E876 Hypokalemia: Secondary | ICD-10-CM | POA: Diagnosis not present

## 2022-04-06 DIAGNOSIS — E559 Vitamin D deficiency, unspecified: Secondary | ICD-10-CM | POA: Diagnosis not present

## 2022-04-06 DIAGNOSIS — F17210 Nicotine dependence, cigarettes, uncomplicated: Secondary | ICD-10-CM | POA: Diagnosis not present

## 2022-04-06 DIAGNOSIS — I10 Essential (primary) hypertension: Secondary | ICD-10-CM | POA: Diagnosis not present

## 2022-04-06 DIAGNOSIS — R200 Anesthesia of skin: Secondary | ICD-10-CM

## 2022-04-06 DIAGNOSIS — G4700 Insomnia, unspecified: Secondary | ICD-10-CM | POA: Diagnosis not present

## 2022-04-06 DIAGNOSIS — E785 Hyperlipidemia, unspecified: Secondary | ICD-10-CM | POA: Diagnosis not present

## 2022-04-06 DIAGNOSIS — G5703 Lesion of sciatic nerve, bilateral lower limbs: Secondary | ICD-10-CM | POA: Diagnosis not present

## 2022-04-06 DIAGNOSIS — K5900 Constipation, unspecified: Secondary | ICD-10-CM | POA: Diagnosis not present

## 2022-04-06 DIAGNOSIS — R1310 Dysphagia, unspecified: Secondary | ICD-10-CM | POA: Diagnosis not present

## 2022-04-06 DIAGNOSIS — K449 Diaphragmatic hernia without obstruction or gangrene: Secondary | ICD-10-CM | POA: Diagnosis not present

## 2022-04-06 DIAGNOSIS — M19011 Primary osteoarthritis, right shoulder: Secondary | ICD-10-CM | POA: Diagnosis not present

## 2022-04-06 DIAGNOSIS — D509 Iron deficiency anemia, unspecified: Secondary | ICD-10-CM | POA: Diagnosis not present

## 2022-04-06 DIAGNOSIS — F3160 Bipolar disorder, current episode mixed, unspecified: Secondary | ICD-10-CM

## 2022-04-06 DIAGNOSIS — M5136 Other intervertebral disc degeneration, lumbar region: Secondary | ICD-10-CM | POA: Diagnosis not present

## 2022-04-06 DIAGNOSIS — R1013 Epigastric pain: Secondary | ICD-10-CM | POA: Diagnosis not present

## 2022-04-06 DIAGNOSIS — R7303 Prediabetes: Secondary | ICD-10-CM | POA: Diagnosis not present

## 2022-04-06 DIAGNOSIS — G894 Chronic pain syndrome: Secondary | ICD-10-CM | POA: Diagnosis not present

## 2022-04-06 DIAGNOSIS — M1610 Unilateral primary osteoarthritis, unspecified hip: Secondary | ICD-10-CM | POA: Diagnosis not present

## 2022-04-06 NOTE — Telephone Encounter (Signed)
Flor from [facility] - [phone]  Patients resting heartrate is 110.  Bobetta Lime are her parameters - please advise.

## 2022-04-07 ENCOUNTER — Ambulatory Visit: Payer: Self-pay | Admitting: Licensed Clinical Social Worker

## 2022-04-07 ENCOUNTER — Encounter: Payer: Self-pay | Admitting: Neurology

## 2022-04-07 ENCOUNTER — Ambulatory Visit (INDEPENDENT_AMBULATORY_CARE_PROVIDER_SITE_OTHER): Payer: Medicare Other | Admitting: Neurology

## 2022-04-07 ENCOUNTER — Ambulatory Visit: Payer: Self-pay

## 2022-04-07 ENCOUNTER — Telehealth: Payer: Self-pay | Admitting: Internal Medicine

## 2022-04-07 VITALS — BP 122/80 | HR 129 | Ht 64.0 in | Wt 223.0 lb

## 2022-04-07 DIAGNOSIS — R292 Abnormal reflex: Secondary | ICD-10-CM

## 2022-04-07 DIAGNOSIS — R202 Paresthesia of skin: Secondary | ICD-10-CM | POA: Diagnosis not present

## 2022-04-07 DIAGNOSIS — I10 Essential (primary) hypertension: Secondary | ICD-10-CM

## 2022-04-07 DIAGNOSIS — R29898 Other symptoms and signs involving the musculoskeletal system: Secondary | ICD-10-CM | POA: Diagnosis not present

## 2022-04-07 DIAGNOSIS — Z9181 History of falling: Secondary | ICD-10-CM

## 2022-04-07 MED ORDER — GABAPENTIN 300 MG PO CAPS: 300.0000 mg | ORAL_CAPSULE | Freq: Every day | ORAL | 1 refills | Status: DC

## 2022-04-07 NOTE — Progress Notes (Signed)
[facility] Clinic Note - Initial Visit   Date: 04/07/2022   Shannon [name] MRN: [id number] DOB: 1965-11-16   Dear Shannon Dingwall, NP-C:  Thank you for your kind referral of Shannon [name] for consultation of numbness/tingling. Although her history is well known to you, please allow Shannon [name] to reiterate it for the purpose of our medical record. The patient was accompanied to the clinic by self.    Shannon [name] is a 57 y.o. right-handed female with bipolar disorder, GERD, hyperlipidemia, hypertension, lumbar disc disease, peptic ulcer disease, and GAD presenting for evaluation of numbness/tingling of the hands and feet.   IMPRESSION/PLAN: Bilateral hand and lower extremity paresthesias and weakness.  Exam shows distal sensory loss, hyperreflexia, and some mild distal weakness.  Given the subacute onset and progressive nature, I will check MRI cervical spine wo contrast to evaluate for compressive pathology.  If imaging is nondiagnostic, the next step his NCS/EMG of the right arm and leg.   Further recommendations pending results.   ------------------------------------------------------------- History of present illness: Starting around August 2023, she began having sharp stabbing pain in the toes, which extends into the legs.  She also complains of stabbing pain in the hands.  She has numbness of the hands and feet.She was given gabapentin 153m TID< which she stopped due to no benefit.   She has gradually been getting weaker in the legs and using a walker since November because of leg weakness and falls. She has muscle twitches in the legs, no cramps.  She needs assistance to climb steps. Prior to November, she was walking unassisted. Hands are also weak, especially with grip.  She denies neck pain.  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 She has been on disability since 1998 for bipolar disorder.   Out-side paper records, electronic medical record, and images have been reviewed where available and summarized as:  Lab Results  Component Value Date   HGBA1C 6.1 05/14/2021   Lab Results  Component Value Date   [id number]/22/2023   Lab Results  Component Value Date   TSH 2.20 01/14/2022   Lab Results  Component Value Date   ESRSEDRATE 69 (H) 09/01/2018    Past Medical History:  Diagnosis Date   Allergic rhinitis    Allergic rhinitis 01/25/2009        ANEMIA-IRON DEFICIENCY 01/25/2009   Anxiety    Backache 01/25/2009   Qualifier: Diagnosis of  By: JJenny ReichmannMD, JHunt Oris   Bipolar 1 disorder ([facility]    Chronic pain syndrome    Complete rotator cuff tear 07/21/2013   Complete tear of right rotator cuff 07/21/2013   Contact lens/glasses fitting    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   DPrince FrederickDISEASE, LUMBAR 01/25/2009   Essential hypertension [id number]  On Bystolic    GAD (generalized anxiety disorder) 12/28/2013   GERD (gastroesophageal reflux disease)    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   Headache, acute 12/20/2013   Hx of laparoscopic gastric banding 09/03/2010   Surgery date: 07/17/10    HYPERLIPIDEMIA 01/25/2009   Hyperlipidemia with target LDL less than 130 01/25/2009   HYPERTENSION 01/25/2009   Hypokalemia, inadequate intake 09/01/2011   Insomnia 04/04/2011   INSOMNIA-SLEEP DISORDER-UNSPEC 04/30/2009   Qualifier: Diagnosis of  By: JJenny ReichmannMD, JHunt Oris   Iron  deficiency anemia 01/25/2009        Labyrinthitis 02/19/2014   Leiomyoma of uterus, unspecified 08/13/2008   Overview:  Leiomyoma Of The Uterus  10/1 IMO update   Localized edema 06/12/2015   MANIC DEPRESSIVE ILLNESS 01/25/2009   pt is unsue if this is her specifc dx   Mixed bipolar I disorder ([place]) 07/12/2012   Morbid obesity ([place]) 01/25/2009   Night sweats    Osteopenia determined by x-ray 06/13/2021   Other abnormal glucose 10/26/2012    Palpitations 10/25/2013   PEPTIC ULCER DISEASE, HELICOBACTER PYLORI POSITIVE 10/03/2009   Peripheral edema 06/25/2015   Pernicious anemia 03/28/2012   Piriformis syndrome of both sides 09/02/2018   PUD (peptic ulcer disease) 10/03/2009        Right shoulder pain 05/03/2013   Dg Shoulder Right  05/03/2013   CLINICAL DATA Pain.  EXAM RIGHT SHOULDER - 2+ VIEW  COMPARISON None.  FINDINGS Acromioclavicular and glenohumeral degenerative change present. Questionable calcific density noted in the region of the supraspinatus space, possibly representing calcific supraspinatus tendinitis. This could represent a sclerotic density in the acromion. MRI of the right shoulder suggest   Sleep apnea 08/12/2016   SMOKER 01/25/2009   Qualifier: Diagnosis of  By: Jenny Reichmann MD, Hunt Oris    Subacromial bursitis 05/17/2013   SUBSTANCE ABUSE 11/06/2009        Thiamin deficiency 10/30/2012   Tobacco abuse 06/09/2010   Visual disturbance 05/03/2013   Vitamin D deficiency 10/27/2012    Past Surgical History:  Procedure Laterality Date   ABDOMINAL HYSTERECTOMY     BLADDER SURGERY     s/p with ?diverticulitis   HAND TENDON SURGERY  1991   s/p-Right-index and middle   LAPAROSCOPIC GASTRIC BAND REMOVAL WITH LAPAROSCOPIC GASTRIC SLEEVE RESECTION N/A 08/17/2016   Procedure: LAPAROSCOPIC GASTRIC BAND REMOVAL WITH LAPAROSCOPIC GASTRIC SLEEVE RESECTION WITH UPPER ENDO;  Surgeon: Alphonsa Overall, MD;  Location: [facility];  Service: General;  Laterality: N/A;   LAPAROSCOPIC GASTRIC BANDING  07/14/10   SHOULDER ARTHROSCOPY Right 07/21/2013   Procedure: RIGHT ARTHROSCOPY SHOULDER DEBRIDMENT EXTENTSIVE,ARTHROSCOPIC REMOVE LOOSE FOREIGN BODY, BICEPS TENOLYSIS ;  Surgeon: Johnny Bridge, MD;  Location: [facility];  Service: Orthopedics;  Laterality: Right;   TUBAL LIGATION       Medications:  Outpatient Encounter Medications as of 04/07/2022  Medication Sig   atorvastatin (LIPITOR) 20 MG tablet Take 1 tablet (20 mg total) by mouth daily.    clonazePAM (KLONOPIN) 0.5 MG tablet Take 1 tablet (0.5 mg total) by mouth 2 (two) times daily as needed for anxiety.   FLUoxetine (PROZAC) 20 MG capsule Take 1 capsule (20 mg total) by mouth daily.   linaclotide (LINZESS) 145 MCG CAPS capsule Take 1 capsule (145 mcg total) by mouth daily before breakfast.   Multiple Vitamins-Minerals (BARIATRIC MULTIVITAMINS/IRON) CAPS Take 1 capsule by mouth daily.   pantoprazole (PROTONIX) 40 MG tablet Take 1 tablet (40 mg total) by mouth 2 (two) times daily before a meal.   polyethylene glycol powder (GLYCOLAX/MIRALAX) 17 GM/SCOOP powder Take entire bottle in 32 ounces of sports drink as directed   potassium chloride SA (KLOR-CON M) 20 MEQ tablet Take 1 tablet (20 mEq total) by mouth daily.   QUEtiapine (SEROQUEL) 25 MG tablet Take 1 tablet (25 mg total) by mouth 2 (two) times daily.   QUEtiapine (SEROQUEL) 300 MG tablet Take 1 tablet (300 mg total) by mouth at bedtime.   tiZANidine (ZANAFLEX) 4 MG tablet Take 1 tablet (4 mg total)  by mouth at bedtime.   amLODipine (NORVASC) 5 MG tablet Take 1 tablet (5 mg total) by mouth daily. (Patient not taking: Reported on 02/19/2022)   ARIPiprazole (ABILIFY) 5 MG tablet Take 1 tablet (5 mg total) by mouth daily. (Patient not taking: Reported on 02/19/2022)   cyclobenzaprine (FLEXERIL) 10 MG tablet Take 1 tablet (10 mg total) by mouth at bedtime as needed for muscle spasms. (Patient not taking: Reported on 04/07/2022)   gabapentin (NEURONTIN) 100 MG capsule Take 1 capsule (100 mg total) by mouth 3 (three) times daily. (Patient not taking: Reported on 04/07/2022)   meloxicam (MOBIC) 15 MG tablet Take 1 tablet (15 mg total) by mouth daily as needed for pain. (Patient not taking: Reported on 02/19/2022)   No facility-administered encounter medications on file as of 04/07/2022.    Allergies:  Allergies  Allergen Reactions   Lamictal [Lamotrigine] Rash    Family History: Family History  Problem Relation Age of Onset    Hypertension Mother    Stroke Father    Cirrhosis Father        ETOH   Hypertension Father    Diabetes Father    Alcohol abuse Father    Hypertension Sister    Asthma Sister    Hypertension Brother    Hypertension Paternal Aunt    Diabetes Maternal Grandmother    ADD / ADHD Son    ADD / ADHD Other        3 nephews, also emotional issues   Diabetes Other        Uncle   Diabetes Other        Aunt   Suicidality Neg Hx     Social History: Social History   Tobacco Use   Smoking status: Every Day    Packs/day: 0.25    Years: 20.00    Total pack years: 5.00    Types: Cigarettes   Smokeless tobacco: Never   Tobacco comments:    States has cut back to 5-7 a day and working on this.   Vaping Use   Vaping Use: Never used  Substance Use Topics   Alcohol use: No    Alcohol/week: 0.0 standard drinks of alcohol   Drug use: No   Social History   Social History Narrative   Moved from [place], then [place] to [place] 2009   2 children-1 boy, 1 girl   Disabled-bipolar   Daily Caffeine Use-1 cup/day      Right Handed   Lives in a 2 story home    Vital Signs:  BP 122/80   Pulse (!) 129   Ht 5' 4"$  (1.626 m)   Wt 223 lb (101.2 kg)   SpO2 99%   BMI 38.28 kg/m   Neurological Exam: MENTAL STATUS including orientation to time, place, person, recent and remote memory, attention span and concentration, language, and fund of knowledge is normal.  Speech is not dysarthric.  CRANIAL NERVES: II:  No visual field defects.     III-IV-VI: Pupils equal round and reactive to light.  Normal conjugate, extra-ocular eye movements in all directions of gaze.  No nystagmus.  No ptosis.   V:  Normal facial sensation.    VII:  Normal facial symmetry and movements.   VIII:  Normal hearing and vestibular function.   IX-X:  Normal palatal movement.   XI:  Normal shoulder shrug and head rotation.   XII:  Normal tongue strength and range of motion, no deviation or fasciculation.  MOTOR:  No  atrophy, fasciculations or abnormal movements.  No pronator drift.   Upper Extremity:  Right  Left  Deltoid  5/5   5/5   Biceps  5/5   5/5   Triceps  5/5   5/5   Infraspinatus 5/5  5/5  Medial pectoralis 5/5  5/5  Wrist extensors  5/5   5/5   Wrist flexors  5/5   5/5   Finger extensors  5-/5   5-/5   Finger flexors  5-/5   5-/5   Dorsal interossei  4/5   4/5   Abductor pollicis  4/5   4/5   Tone (Ashworth scale)  0  0   Lower Extremity:  Right  Left  Hip flexors  5-/5   5-/5   Knee flexors  5/5   5/5   Knee extensors  5/5   5/5   Dorsiflexors  5-/5   5-/5   Plantarflexors  5-/5   5-/5   Toe extensors  5-/5   5-/5   Toe flexors  5-/5   5-/5   Tone (Ashworth scale)  0  0   MSRs:                                           Right        Left brachioradialis 3+  3+  biceps 3+  3+  triceps 2+  2+  patellar 2+  2+  ankle jerk 1+  1+  Hoffman no  no  plantar response down  down   SENSORY:  Vibration absent below the ankles bilaterally, pin prick and temperature reduced in the palms and below the mid-calf bilaterally.    COORDINATION/GAIT: Normal finger-to- nose-finger.  Finger and toe tapping slowed bilaterally. Gait is assisted with walker, stable.     Thank you for allowing me to participate in patient's care.  If I can answer any additional questions, I would be pleased to do so.    Sincerely,    Betina Puckett K. Posey Pronto, DO

## 2022-04-07 NOTE — Patient Instructions (Signed)
Visit Information  Thank you for taking time to visit with me today. Please don't hesitate to contact me if I can be of assistance to you before our next scheduled telephone appointment.  Following are the goals we discussed today:    No further intervention is needed from LCSW at this time.   Please call the care guide team at [phone] if you need to cancel or reschedule your appointment.   If you are experiencing a Mental Health or [place] or need someone to talk to, please go to [facility] [place] [phone])   Following is a copy of your full plan of care:   Interventions:  LCSW called client today and spoke with Shannon [name] via phone about her needs. She is preparing to move to [place], [place] to be closer to family members. Client has been working actively with Donald Siva RN related to her current nursing needs. Client reported she has adequate food supply and is doing well with meals.  She did not respond when asked about her mobility by LCSW. She has in past had challenges with mobility Discussed family support in [place], [place] area. She is looking forward to moving to [place], [place] said she is comfortable with help she is receiving from [facility] and feels that her needs are being met in working with Donald Siva RN LCSW thanked client for phone call today with LCSW  Shannon [name] was given information about Care Management services by the embedded care coordination team including:  Care Management services include personalized support from designated clinical staff supervised by her physician, including individualized plan of care and coordination with other care providers 24/7 contact phone numbers for assistance for urgent and routine care needs. The patient may stop CCM services at any time (effective at the end of the month) by phone call to the office  staff.  Patient agreed to services and verbal consent obtained.   Norva Riffle.Pricsilla Lindvall MSW, [place] [facility] representative [facility] Care Management [phone]

## 2022-04-07 NOTE — Patient Outreach (Signed)
  Care Coordination   Follow Up Visit Note   04/07/2022 Name: DAEJA HELDERMAN MRN: [id number] DOB: November 04, 1965  SELESTE TALLMAN is a 57 y.o. year old female who sees [place], [place], NP-C for primary care. I spoke with  [name] by phone today.  What matters to the patients health and wellness today? Patient is stressed since her husband has left nd he was providing most of care for client. She is now planning to move to [place], [place]               This Visit's Progress     patient is stressed since her husband has left and he was providing most of her care (pt-stated)        Interventions:  LCSW called client today and spoke with Ulyses Amor via phone about her needs. She is preparing to move to [place], [place] to be closer to family members. Client has been working actively with Donald Siva RN related to her current nursing needs. Client reported she has adequate food supply and is doing well with meals.  She did not respond when asked about her mobility by LCSW. She has in past had challenges with mobility Discussed family support in [place], [place] area. She is looking forward to moving to [place], [place] said she is comfortable with help she is receiving from [facility] and feels that her needs are being met in working with Donald Siva RN LCSW thanked client for phone call today with LCSW      SDOH assessments and interventions completed:  Yes  SDOH Interventions Today    Flowsheet Row Most Recent Value  SDOH Interventions   Depression Interventions/Treatment  Currently on Treatment  Physical Activity Interventions Other (Comments)  [challenges with walking]  Stress Interventions Other (Comment)  [client has stress related to financial needs and meeting daily care needs of client]        Care Coordination Interventions:  Yes, provided    Interventions Today    Flowsheet Row Most Recent Value  Chronic  Disease   Chronic disease during today's visit Other  [depression]  General Interventions   General Interventions Discussed/Reviewed General Interventions Discussed  [discussed that she is in process of preparing to move to [place], [place] to be closer to family [place] Discussed/Reviewed Coping Strategies  [discussed fact that she has several family members living in [place] , [place] and this could be coping strength for her when she moves to that region.]       Follow up plan: No further intervention required.   Encounter Outcome:  Pt. Visit Completed   Norva Riffle.Lizbeth Feijoo MSW, [place] [facility] representative [facility] Care Management [phone]

## 2022-04-07 NOTE — Patient Instructions (Signed)
MRI cervical spine without contrast  If your imaging does not provide an explanation for your symptoms, then the next step will be nerve testing.  ELECTROMYOGRAM AND NERVE CONDUCTION STUDIES (EMG/NCS) INSTRUCTIONS  How to Prepare The neurologist conducting the EMG will need to know if you have certain medical conditions. Tell the neurologist and other EMG lab personnel if you: Have a pacemaker or any other electrical medical device Take blood-thinning medications Have hemophilia, a blood-clotting disorder that causes prolonged bleeding Bathing Take a shower or bath shortly before your exam in order to remove oils from your skin. Don't apply lotions or creams before the exam.  What to Expect You'll likely be asked to change into a hospital gown for the procedure and lie down on an examination table. The following explanations can help you understand what will happen during the exam.  Electrodes. The neurologist or a technician places surface electrodes at various locations on your skin depending on where you're experiencing symptoms. Or the neurologist may insert needle electrodes at different sites depending on your symptoms.  Sensations. The electrodes will at times transmit a tiny electrical current that you may feel as a twinge or spasm. The needle electrode may cause discomfort or pain that usually ends shortly after the needle is removed. If you are concerned about discomfort or pain, you may want to talk to the neurologist about taking a short break during the exam.  Instructions. During the needle EMG, the neurologist will assess whether there is any spontaneous electrical activity when the muscle is at rest - activity that isn't present in healthy muscle tissue - and the degree of activity when you slightly contract the muscle.  He or she will give you instructions on resting and contracting a muscle at appropriate times. Depending on what muscles and nerves the neurologist is examining,  he or she may ask you to change positions during the exam.  After your EMG You may experience some temporary, minor bruising where the needle electrode was inserted into your muscle. This bruising should fade within several days. If it persists, contact your primary care doctor.

## 2022-04-07 NOTE — Telephone Encounter (Signed)
Patient calling to cancel her appt for tomorrow at 1:30 due to having a bad fall and is immobile. Patient will call back to reschedule

## 2022-04-08 ENCOUNTER — Ambulatory Visit: Payer: Self-pay

## 2022-04-08 ENCOUNTER — Ambulatory Visit: Payer: Self-pay | Admitting: Licensed Clinical Social Worker

## 2022-04-08 ENCOUNTER — Encounter: Payer: Medicare Other | Admitting: Internal Medicine

## 2022-04-08 ENCOUNTER — Other Ambulatory Visit: Payer: Self-pay

## 2022-04-08 DIAGNOSIS — R202 Paresthesia of skin: Secondary | ICD-10-CM

## 2022-04-08 DIAGNOSIS — R262 Difficulty in walking, not elsewhere classified: Secondary | ICD-10-CM

## 2022-04-08 DIAGNOSIS — R5381 Other malaise: Secondary | ICD-10-CM

## 2022-04-08 DIAGNOSIS — I10 Essential (primary) hypertension: Secondary | ICD-10-CM

## 2022-04-08 DIAGNOSIS — F3160 Bipolar disorder, current episode mixed, unspecified: Secondary | ICD-10-CM

## 2022-04-08 NOTE — Telephone Encounter (Signed)
Called and relayed below information to pt, pt verbalized understanding.

## 2022-04-08 NOTE — Telephone Encounter (Signed)
Spoke with pt and she reports yesterday she had lots of trouble managing stairs. She went to her neurologist appt and was unable to manage the stairs and had to have someone who works there hold her and help her get down. When she got to her apartment she states she could not get her left leg to hold her weight and she fell on the concrete stairs and had to call EMS. I informed her of the information we got in regards to her pulse readings and she states at this time she would like to come to an appt but is too scared that she will fall again as her complex does not have an elevator, just 15 concrete steps that she cannot and does not want to go down again after her fall. She would like to know if there is anything else she can do?    Cyndi also mentioned [facility] was having a discrepancy with the 2 letters to which I informed her I will contact them and get this resolved. I was able to speak with Amy at [phone] and informed her the 2nd letter was correct and that she can live independently, however she is unable to manage stairs and needs a place with an elevator. Amy reports she just needs to speak with leasing agent and then will get back to Ms. Broadus John later today.

## 2022-04-08 NOTE — Patient Outreach (Signed)
  Care Coordination   Follow Up Visit Note   04/08/2022 Name: Shannon [name] MRN: [id number] DOB: 1965/04/05  Shannon [name] is a 58 y.o. year old female who sees [place], [place], NP-C for primary care. I spoke with  Donald Siva RN today in POD 3 meeting about client needs.  What matters to the patients health and wellness today? Patient is stressed since her husband left and he was providing  most of her care    Goals Addressed               This Visit's Progress     patient is stressed since her husband has left and he was providing most of her care (pt-stated)        Interventions:  LCSW spoke of client needs and status today with POD 3 members in weekly POD 3 group meeting  Donald Siva RN has been helping client with nursing needs. Felicia reported that spouse of client has been bringing client food items recently and that this has been helpful to client Solmon Ice and LCSW spoke of client plans to move to [place], [place] to be closer to family members. Solmon Ice said client is planning on renting an apartment in [place], [place]. Spoke of recent fall of client.  LCSW informed Donald Siva RN that client said she felt comfortable communicating with Donald Siva about her needs. Client is in agreement with plan for her to move to apartment in [place], [place]      SDOH assessments and interventions completed:  Yes    Care Coordination Interventions:  Yes, provided    Interventions Today    Flowsheet Row Most Recent Value  Chronic Disease   Chronic disease during today's visit Other  [discussed fall risk of client,  discussed health needs of client]  General Interventions   General Interventions Discussed/Reviewed General Interventions Discussed  [discussed client plans to move to [place], [place] to be close to relatives]  [place] Discussed/Reviewed Coping Strategies  [discssed stress issues of client. Her spouse has been  helping her more recently with food procurement]       Follow up plan: RN Donald Siva to call client as scheduled to discuss nursing needs of client   Encounter Outcome:  Pt. Visit Completed   Norva Riffle.Faiga Stones MSW, [place] representative [facility] Care Management [phone]

## 2022-04-09 ENCOUNTER — Telehealth: Payer: Self-pay

## 2022-04-09 ENCOUNTER — Other Ambulatory Visit: Payer: Self-pay | Admitting: Family Medicine

## 2022-04-09 DIAGNOSIS — Z599 Problem related to housing and economic circumstances, unspecified: Secondary | ICD-10-CM

## 2022-04-09 DIAGNOSIS — E639 Nutritional deficiency, unspecified: Secondary | ICD-10-CM

## 2022-04-09 DIAGNOSIS — R262 Difficulty in walking, not elsewhere classified: Secondary | ICD-10-CM

## 2022-04-09 DIAGNOSIS — R5381 Other malaise: Secondary | ICD-10-CM

## 2022-04-09 DIAGNOSIS — F3160 Bipolar disorder, current episode mixed, unspecified: Secondary | ICD-10-CM

## 2022-04-09 DIAGNOSIS — Z748 Other problems related to care provider dependency: Secondary | ICD-10-CM

## 2022-04-09 DIAGNOSIS — R202 Paresthesia of skin: Secondary | ICD-10-CM

## 2022-04-09 NOTE — Telephone Encounter (Signed)
   Telephone encounter was:  Successful.  04/09/2022 Name: Shannon [name] MRN: [id number] DOB: 01/02/1966  Shannon [name] is a 57 y.o. year old female who is a primary care patient of [name], [name], NP-C . The community resource team was consulted for assistance with Transportation Needs , Food Insecurity, and senior activities.  Care guide performed the following interventions: -Spoke with patient about SCAT transportation, Comptroller, [place] and [place] Adult activities and centers. Verified email address to send information per patient request.  Follow Up Plan:  No further follow up planned at this time. The patient has been provided with needed resources.  [place] Resource Care Guide   [email or web address]  ?? [phone]   Website: [email or web address]  [email or web address]

## 2022-04-09 NOTE — Telephone Encounter (Signed)
   Telephone encounter was:  Successful.  04/09/2022 Name: Shannon [name] MRN: [id number] DOB: 07-Jul-1965  Shannon [name] is a 57 y.o. year old female who is a primary care patient of [name], [name], NP-C . The community resource team was consulted for assistance with Transportation Needs , Food Insecurity, and senior activities.  Care guide performed the following interventions: Spoke with patient about SCAT transportation, Comptroller, [place] and [place] Adult activities and centers. Verified email address to send information per patient request.  Follow Up Plan:  No further follow up planned at this time. The patient has been provided with needed resources.  [place] Resource Care Guide   ??[email or web address]  ?? [phone]   Website: [email or web address]  [email or web address]

## 2022-04-09 NOTE — Telephone Encounter (Signed)
Entered in error [employer].

## 2022-04-10 ENCOUNTER — Ambulatory Visit: Payer: Self-pay

## 2022-04-10 DIAGNOSIS — F3160 Bipolar disorder, current episode mixed, unspecified: Secondary | ICD-10-CM

## 2022-04-13 ENCOUNTER — Ambulatory Visit: Payer: Self-pay

## 2022-04-13 DIAGNOSIS — K449 Diaphragmatic hernia without obstruction or gangrene: Secondary | ICD-10-CM | POA: Diagnosis not present

## 2022-04-13 DIAGNOSIS — R7303 Prediabetes: Secondary | ICD-10-CM | POA: Diagnosis not present

## 2022-04-13 DIAGNOSIS — G5703 Lesion of sciatic nerve, bilateral lower limbs: Secondary | ICD-10-CM | POA: Diagnosis not present

## 2022-04-13 DIAGNOSIS — E559 Vitamin D deficiency, unspecified: Secondary | ICD-10-CM | POA: Diagnosis not present

## 2022-04-13 DIAGNOSIS — M1610 Unilateral primary osteoarthritis, unspecified hip: Secondary | ICD-10-CM | POA: Diagnosis not present

## 2022-04-13 DIAGNOSIS — F17210 Nicotine dependence, cigarettes, uncomplicated: Secondary | ICD-10-CM | POA: Diagnosis not present

## 2022-04-13 DIAGNOSIS — M19011 Primary osteoarthritis, right shoulder: Secondary | ICD-10-CM | POA: Diagnosis not present

## 2022-04-13 DIAGNOSIS — G4700 Insomnia, unspecified: Secondary | ICD-10-CM | POA: Diagnosis not present

## 2022-04-13 DIAGNOSIS — K5900 Constipation, unspecified: Secondary | ICD-10-CM | POA: Diagnosis not present

## 2022-04-13 DIAGNOSIS — M5136 Other intervertebral disc degeneration, lumbar region: Secondary | ICD-10-CM | POA: Diagnosis not present

## 2022-04-13 DIAGNOSIS — D509 Iron deficiency anemia, unspecified: Secondary | ICD-10-CM | POA: Diagnosis not present

## 2022-04-13 DIAGNOSIS — R1310 Dysphagia, unspecified: Secondary | ICD-10-CM | POA: Diagnosis not present

## 2022-04-13 DIAGNOSIS — R262 Difficulty in walking, not elsewhere classified: Secondary | ICD-10-CM | POA: Diagnosis not present

## 2022-04-13 DIAGNOSIS — I10 Essential (primary) hypertension: Secondary | ICD-10-CM | POA: Diagnosis not present

## 2022-04-13 DIAGNOSIS — F3160 Bipolar disorder, current episode mixed, unspecified: Secondary | ICD-10-CM

## 2022-04-13 DIAGNOSIS — E785 Hyperlipidemia, unspecified: Secondary | ICD-10-CM | POA: Diagnosis not present

## 2022-04-13 DIAGNOSIS — K219 Gastro-esophageal reflux disease without esophagitis: Secondary | ICD-10-CM | POA: Diagnosis not present

## 2022-04-13 DIAGNOSIS — E876 Hypokalemia: Secondary | ICD-10-CM | POA: Diagnosis not present

## 2022-04-13 DIAGNOSIS — G894 Chronic pain syndrome: Secondary | ICD-10-CM | POA: Diagnosis not present

## 2022-04-13 DIAGNOSIS — R202 Paresthesia of skin: Secondary | ICD-10-CM | POA: Diagnosis not present

## 2022-04-13 DIAGNOSIS — R1013 Epigastric pain: Secondary | ICD-10-CM | POA: Diagnosis not present

## 2022-04-14 ENCOUNTER — Ambulatory Visit: Payer: Medicare Other

## 2022-04-14 DIAGNOSIS — F3160 Bipolar disorder, current episode mixed, unspecified: Secondary | ICD-10-CM

## 2022-04-14 NOTE — Chronic Care Management (AMB) (Signed)
Chronic Care Management   CCM RN Visit Note  04/14/2022 Name: Shannon [name] MRN: [id number] DOB: 07-Sep-1965  Subjective: Shannon [name] is a 57 y.o. year old female who is a primary care patient of Shannon [name], Shannon L, NP-C. The patient was referred to the Chronic Care Management team for assistance with care management needs subsequent to provider initiation of CCM services and plan of care.    Today's Visit:  Engaged with patient by telephone for follow up visit.    Goals Addressed             This Visit's Progress    Goal: CCM (Mixed Bipolar Disorder) Expected Outcome:  Monitor, Self-Manage And Reduce Symptoms of Mixed Bipolar Disorder       Current Barriers:  Chronic Disease Management support and education needs related to Mixed Bipolar Disorder Lacks caregiver support.  Corporate treasurer.  Transportation barriers  Planned Interventions: Reviewed plan for treatment of Mixed Bipolar Disorder. Reviewed plan for housing Completed conference call with Eber Jones (Admin) from [employer]. Confirmed receiving provider's letter regarding disability and need for handicap accommodations. Reports that the building is equipped with an elevator which would allow patient options to reside on a higher level. Attempted to confirm patient's approval for an apartment today, however Eber Jones indicated that the Sales promotion account executive were not available. Reports message would be relayed to the property managers to call when they are available. Reviewed plan regarding family support. Reports her brother, niece and sister-in-law are still willing to assist if she relocates to [place]. She feels that her ability to ambulate and complete ADLs will improve after completing prescribed PT and OT. Reviewed safety and fall prevention measures.  Update 04/13/22:  [name] requested additional assistance with completing the housing application for [employer]. Reports being unable to complete and submit d/t pain  and neuropathy in her hands. She previously submitted an electronic copy of the application. However, the application will still require written signatures along with additional financial information. Discussed option for current property manager to assist. Unsure if she will be available d/t serving as primary caregiver for her spouse.  Collaborated with the [facility] team to discuss availability of a Child psychotherapist. Confirmed having a Child psychotherapist that can assist. Unable to confirm the actual availability at the time of the call. Message relayed to PCP regarding order for a Home Health Social Worker. Update 04/14/22: Follow-up with [facility] team. Reports Social Worker will not be available for home visits until March. Discussed information with [name]. She agreed to contact her assigned APS Case Worker, Joni Reining and request a home visit to assist with the application.  Symptom Management: Take medications as prescribed   Attend all scheduled provider appointments Call pharmacy for medication refills 3-7 days in advance of running out of medications Work with the care management team to address care coordination needs and will continue to work with the clinical team to address health care and disease management related needs Call provider office for new concerns or questions  Call the Suicide and Crisis Lifeline: 988 Call the [place] National Suicide Prevention Lifeline: (904)499-6675 or TTY: 843-051-0902 TTY (432)708-1085) to talk to a trained counselor Call 1-800-273-TALK (toll free, 24 hour hotline) Go to [facility] [address] [place] [phone]) Call 911 if experiencing a Mental Health or Behavioral Health Crisis   Follow Up Plan: Will follow up within the next week  PLAN: Will follow up within the next week   Katha Cabal RN Care Manager/Chronic Care Management [phone]

## 2022-04-15 ENCOUNTER — Other Ambulatory Visit (HOSPITAL_COMMUNITY): Payer: Self-pay | Admitting: Psychiatry

## 2022-04-15 ENCOUNTER — Ambulatory Visit: Payer: Self-pay

## 2022-04-15 DIAGNOSIS — F3160 Bipolar disorder, current episode mixed, unspecified: Secondary | ICD-10-CM

## 2022-04-15 DIAGNOSIS — F4310 Post-traumatic stress disorder, unspecified: Secondary | ICD-10-CM

## 2022-04-15 DIAGNOSIS — F319 Bipolar disorder, unspecified: Secondary | ICD-10-CM

## 2022-04-17 ENCOUNTER — Telehealth: Payer: Self-pay | Admitting: Family Medicine

## 2022-04-17 ENCOUNTER — Ambulatory Visit: Payer: Self-pay

## 2022-04-17 DIAGNOSIS — F3160 Bipolar disorder, current episode mixed, unspecified: Secondary | ICD-10-CM

## 2022-04-17 NOTE — Telephone Encounter (Signed)
[place]:  Shannon [name]   Phone Number:[phone]   Needs Verbal orders for what service & frequency:  To move her occupational therapy evaluation to next week.

## 2022-04-17 NOTE — Telephone Encounter (Signed)
Ok for verbal orders ?

## 2022-04-20 DIAGNOSIS — I10 Essential (primary) hypertension: Secondary | ICD-10-CM | POA: Diagnosis not present

## 2022-04-20 DIAGNOSIS — G894 Chronic pain syndrome: Secondary | ICD-10-CM | POA: Diagnosis not present

## 2022-04-20 DIAGNOSIS — F17210 Nicotine dependence, cigarettes, uncomplicated: Secondary | ICD-10-CM | POA: Diagnosis not present

## 2022-04-20 DIAGNOSIS — R202 Paresthesia of skin: Secondary | ICD-10-CM | POA: Diagnosis not present

## 2022-04-20 DIAGNOSIS — K219 Gastro-esophageal reflux disease without esophagitis: Secondary | ICD-10-CM | POA: Diagnosis not present

## 2022-04-20 DIAGNOSIS — R1310 Dysphagia, unspecified: Secondary | ICD-10-CM | POA: Diagnosis not present

## 2022-04-20 DIAGNOSIS — D509 Iron deficiency anemia, unspecified: Secondary | ICD-10-CM | POA: Diagnosis not present

## 2022-04-20 DIAGNOSIS — M19011 Primary osteoarthritis, right shoulder: Secondary | ICD-10-CM | POA: Diagnosis not present

## 2022-04-20 DIAGNOSIS — M1610 Unilateral primary osteoarthritis, unspecified hip: Secondary | ICD-10-CM | POA: Diagnosis not present

## 2022-04-20 DIAGNOSIS — K449 Diaphragmatic hernia without obstruction or gangrene: Secondary | ICD-10-CM | POA: Diagnosis not present

## 2022-04-20 DIAGNOSIS — R1013 Epigastric pain: Secondary | ICD-10-CM | POA: Diagnosis not present

## 2022-04-20 DIAGNOSIS — E559 Vitamin D deficiency, unspecified: Secondary | ICD-10-CM | POA: Diagnosis not present

## 2022-04-20 DIAGNOSIS — G5703 Lesion of sciatic nerve, bilateral lower limbs: Secondary | ICD-10-CM | POA: Diagnosis not present

## 2022-04-20 DIAGNOSIS — R7303 Prediabetes: Secondary | ICD-10-CM | POA: Diagnosis not present

## 2022-04-20 DIAGNOSIS — K5900 Constipation, unspecified: Secondary | ICD-10-CM | POA: Diagnosis not present

## 2022-04-20 DIAGNOSIS — M5136 Other intervertebral disc degeneration, lumbar region: Secondary | ICD-10-CM | POA: Diagnosis not present

## 2022-04-20 DIAGNOSIS — E876 Hypokalemia: Secondary | ICD-10-CM | POA: Diagnosis not present

## 2022-04-20 DIAGNOSIS — E785 Hyperlipidemia, unspecified: Secondary | ICD-10-CM | POA: Diagnosis not present

## 2022-04-20 DIAGNOSIS — G4700 Insomnia, unspecified: Secondary | ICD-10-CM | POA: Diagnosis not present

## 2022-04-20 DIAGNOSIS — R262 Difficulty in walking, not elsewhere classified: Secondary | ICD-10-CM | POA: Diagnosis not present

## 2022-04-20 NOTE — Telephone Encounter (Signed)
Called and spoke with Shirlee Limerick, gave her the ok for verbal orders.

## 2022-04-21 DIAGNOSIS — R1310 Dysphagia, unspecified: Secondary | ICD-10-CM | POA: Diagnosis not present

## 2022-04-21 DIAGNOSIS — M19011 Primary osteoarthritis, right shoulder: Secondary | ICD-10-CM | POA: Diagnosis not present

## 2022-04-21 DIAGNOSIS — I10 Essential (primary) hypertension: Secondary | ICD-10-CM | POA: Diagnosis not present

## 2022-04-21 DIAGNOSIS — E559 Vitamin D deficiency, unspecified: Secondary | ICD-10-CM | POA: Diagnosis not present

## 2022-04-21 DIAGNOSIS — R7303 Prediabetes: Secondary | ICD-10-CM | POA: Diagnosis not present

## 2022-04-21 DIAGNOSIS — F17210 Nicotine dependence, cigarettes, uncomplicated: Secondary | ICD-10-CM | POA: Diagnosis not present

## 2022-04-21 DIAGNOSIS — M1610 Unilateral primary osteoarthritis, unspecified hip: Secondary | ICD-10-CM | POA: Diagnosis not present

## 2022-04-21 DIAGNOSIS — M5136 Other intervertebral disc degeneration, lumbar region: Secondary | ICD-10-CM | POA: Diagnosis not present

## 2022-04-21 DIAGNOSIS — K219 Gastro-esophageal reflux disease without esophagitis: Secondary | ICD-10-CM | POA: Diagnosis not present

## 2022-04-21 DIAGNOSIS — R262 Difficulty in walking, not elsewhere classified: Secondary | ICD-10-CM | POA: Diagnosis not present

## 2022-04-21 DIAGNOSIS — K449 Diaphragmatic hernia without obstruction or gangrene: Secondary | ICD-10-CM | POA: Diagnosis not present

## 2022-04-21 DIAGNOSIS — R1013 Epigastric pain: Secondary | ICD-10-CM | POA: Diagnosis not present

## 2022-04-21 DIAGNOSIS — G4700 Insomnia, unspecified: Secondary | ICD-10-CM | POA: Diagnosis not present

## 2022-04-21 DIAGNOSIS — G894 Chronic pain syndrome: Secondary | ICD-10-CM | POA: Diagnosis not present

## 2022-04-21 DIAGNOSIS — K5900 Constipation, unspecified: Secondary | ICD-10-CM | POA: Diagnosis not present

## 2022-04-21 DIAGNOSIS — R202 Paresthesia of skin: Secondary | ICD-10-CM | POA: Diagnosis not present

## 2022-04-21 DIAGNOSIS — E876 Hypokalemia: Secondary | ICD-10-CM | POA: Diagnosis not present

## 2022-04-21 DIAGNOSIS — D509 Iron deficiency anemia, unspecified: Secondary | ICD-10-CM | POA: Diagnosis not present

## 2022-04-21 DIAGNOSIS — G5703 Lesion of sciatic nerve, bilateral lower limbs: Secondary | ICD-10-CM | POA: Diagnosis not present

## 2022-04-21 DIAGNOSIS — E785 Hyperlipidemia, unspecified: Secondary | ICD-10-CM | POA: Diagnosis not present

## 2022-04-22 ENCOUNTER — Telehealth: Payer: Self-pay

## 2022-04-22 ENCOUNTER — Ambulatory Visit: Payer: Medicare Other | Admitting: Podiatry

## 2022-04-22 NOTE — Telephone Encounter (Signed)
Shannon [name] with [facility] has called in for verbal orders of OT 1x 5w and plan to re-cert  Please with verbal okay at [phone].

## 2022-04-22 NOTE — Telephone Encounter (Signed)
Ok for verbal orders ?

## 2022-04-22 NOTE — Telephone Encounter (Signed)
Spoke with Shirlee Limerick and gave the ok for verbal orders and she verbalized understanding

## 2022-04-23 ENCOUNTER — Ambulatory Visit: Payer: Self-pay

## 2022-04-23 DIAGNOSIS — I10 Essential (primary) hypertension: Secondary | ICD-10-CM

## 2022-04-23 DIAGNOSIS — F3160 Bipolar disorder, current episode mixed, unspecified: Secondary | ICD-10-CM

## 2022-04-23 NOTE — Chronic Care Management (AMB) (Signed)
  Chronic Care Management   CCM RN Visit Note  04/23/2022 Name: Shannon [name] MRN: [id number] DOB: [birth year]  Subjective: Shannon [name] is a 57 y.o. year old female who is a primary care patient of Suezanne Jacquet, [name], NP-C. The patient was referred to the Chronic Care Management team for assistance with care management needs subsequent to provider initiation of CCM services and plan of care.    Today's Visit:  Engaged with patient by telephone for follow up visit.    Goals Addressed             This Visit's Progress    Goal: CCM (Mixed Bipolar Disorder) Expected Outcome:  Monitor, Self-Manage And Reduce Symptoms of Mixed Bipolar Disorder       Current Barriers:  Chronic Disease Management support and education needs related to Mixed Bipolar Disorder Lacks caregiver support.  Corporate treasurer.  Transportation barriers  Planned Interventions: Reviewed plan for treatment of Mixed Bipolar Disorder. Reviewed plan for housing Completed conference call with Eber Jones (Admin) from [employer]. Confirmed receiving provider's letter regarding disability and need for handicap accommodations. Reports that the building is equipped with an elevator which would allow patient options to reside on a higher level. Attempted to confirm patient's approval for an apartment today, however Eber Jones indicated that the Sales promotion account executive were not available. Reports message would be relayed to the property managers to call when they are available. Reviewed plan regarding family support. Reports her brother, niece and sister-in-law are still willing to assist if she relocates to [place]. She feels that her ability to ambulate and complete ADLs will improve after completing prescribed PT and OT. Reviewed safety and fall prevention measures.  Update 04/23/22:  Conference call with APS Case Worker and current English as a second language teacher, Materials engineer. Discussed plan for housing and property owner's expectations related to  monthly rental payments and anticipated timeframe that the condo will be available. Danny Lawless is aware of Ms. Schiedel current situation and confirmed that she can remain in the unit under their current verbal agreement. Reports needed rent and additional utilities has been paid by Ms. Askari and her spouse. Does not anticipate an increase in rent, however she is currently caring for a disabled spouse. Agreed to notify Ms. Frei's spouse [place] if the rate increases and additional support for monthly rent is needed. Plan is to ensure Ms. Madl has needed short term housing until she is able to relocate or find long term housing locally.  Symptom Management: Take medications as prescribed   Attend all scheduled provider appointments Call pharmacy for medication refills 3-7 days in advance of running out of medications Work with the care management team to address care coordination needs and will continue to work with the clinical team to address health care and disease management related needs Call provider office for new concerns or questions  Call the Suicide and Crisis Lifeline: 988 Call the [place] National Suicide Prevention Lifeline: 801-675-0338 or TTY: (986)805-2654 TTY 989-378-7387) to talk to a trained counselor Call 1-800-273-TALK (toll free, 24 hour hotline) Go to [facility] [address], [place] [phone]) Call 911 if experiencing a Mental Health or Behavioral Health Crisis   Follow Up Plan: Will follow up within the next week      PLAN: Will follow up within the next week   Katha Cabal RN Care Manager/Chronic Care Management [phone]

## 2022-04-28 ENCOUNTER — Other Ambulatory Visit: Payer: Medicare Other

## 2022-04-28 ENCOUNTER — Telehealth: Payer: Self-pay | Admitting: Family Medicine

## 2022-04-28 NOTE — Telephone Encounter (Signed)
Ok for verbal orders ?

## 2022-04-28 NOTE — Telephone Encounter (Signed)
Shirlee Limerick from [facility] called requesting verbal orders to hold PT and OT for the paitent this week due to the patient is in a depressed state and wishes not to be seen this week. Best callback number for Shirlee Limerick is [phone]. It is a secure line.

## 2022-04-29 NOTE — Telephone Encounter (Signed)
Called and gave Shirlee Limerick the ok for verbal orders  LM for [facility] letting her know to call and schedule ov if needed

## 2022-04-30 ENCOUNTER — Ambulatory Visit: Payer: Medicare Other | Admitting: Podiatry

## 2022-05-01 ENCOUNTER — Ambulatory Visit: Payer: Self-pay

## 2022-05-01 DIAGNOSIS — F3160 Bipolar disorder, current episode mixed, unspecified: Secondary | ICD-10-CM

## 2022-05-01 DIAGNOSIS — Z599 Problem related to housing and economic circumstances, unspecified: Secondary | ICD-10-CM

## 2022-05-01 NOTE — Chronic Care Management (AMB) (Signed)
Chronic Care Management   CCM RN Visit Note  05/01/2022 Name: Shannon [name] MRN: [id number] DOB: 1965/04/20  Subjective: Shannon [name] is a 57 y.o. year old female who is a primary care patient of Suezanne Jacquet, [name], NP-C. The patient was referred to the Chronic Care Management team for assistance with care management needs subsequent to provider initiation of CCM services and plan of care.    Today's Visit:  Engaged with patient by telephone for follow up visit.       Goals Addressed             This Visit's Progress    Goal: CCM (Mixed Bipolar Disorder) Expected Outcome:  Monitor, Self-Manage And Reduce Symptoms of Mixed Bipolar Disorder       Current Barriers:  Chronic Disease Management support and education needs related to Mixed Bipolar Disorder Lacks caregiver support.  Corporate treasurer.  Transportation barriers  Planned Interventions: Reviewed plan for treatment of Mixed Bipolar Disorder. Reviewed plan for housing Completed conference call with Eber Jones (Admin) from [employer]. Confirmed receiving provider's letter regarding disability and need for handicap accommodations. Reports that the building is equipped with an elevator which would allow patient options to reside on a higher level. Attempted to confirm patient's approval for an apartment today, however Eber Jones indicated that the Sales promotion account executive were not available. Reports message would be relayed to the property managers to call when they are available. Reviewed plan regarding family support. Reports her brother, niece and sister-in-law are still willing to assist if she relocates to [place]. She feels that her ability to ambulate and complete ADLs will improve after completing prescribed PT and OT. Reviewed safety and fall prevention measures.  Update 04/23/22:  Conference call with APS Case Worker and current English as a second language teacher, Materials engineer. Discussed plan for housing and property owner's expectations related  to monthly rental payments and anticipated timeframe that the condo will be available. Danny Lawless is aware of Ms. Pehrson current situation and confirmed that she can remain in the unit under their current verbal agreement. Reports needed rent and additional utilities has been paid by Ms. Heiser and her spouse. Does not anticipate an increase in rent, however she is currently caring for a disabled spouse. Agreed to notify Ms. Dimond's spouse [place] if the rate increases and additional support for monthly rent is needed. Plan is to ensure Ms. Teng has needed short term housing until she is able to relocate or find long term housing locally. Update 05/01/22:  Follow up regarding Home Health and plan for transition to [place]. Call was received from the Home Health therapist regarding patient's request to cancel appointments. Reports requesting to cancel all appointments for the next week but plans to resume Home Health PT.  Discussed plan regarding rental/financial needs. Investment banker, corporate confirmed that she is willing to accept an amount below the asking rental rate to ensure Ms. Iott has a temporary place to reside. Confirmed that her spouse has agreed to assist with paying $800 a month.   Symptom Management: Take medications as prescribed   Attend all scheduled provider appointments Call pharmacy for medication refills 3-7 days in advance of running out of medications Work with the care management team to address care coordination needs and will continue to work with the clinical team to address health care and disease management related needs Call provider office for new concerns or questions  Call the Suicide and Crisis Lifeline: 988 Call the [place] National Suicide Prevention Lifeline: (780) 166-1127 or TTY: 9498201025  TTY (512)186-4538) to talk to a trained counselor Call 1-800-273-TALK (toll free, 24 hour hotline) Go to [facility] [address]  [place] [phone]) Call 911 if experiencing a Mental Health or Behavioral Health Crisis   Follow Up Plan: Will follow up this month          PLAN: A member of the care management team will follow up this month.   [facility]/Chronic Care Management [phone]

## 2022-05-04 ENCOUNTER — Telehealth (HOSPITAL_COMMUNITY): Payer: Medicare Other | Admitting: Psychiatry

## 2022-05-04 DIAGNOSIS — F17210 Nicotine dependence, cigarettes, uncomplicated: Secondary | ICD-10-CM | POA: Diagnosis not present

## 2022-05-04 DIAGNOSIS — R202 Paresthesia of skin: Secondary | ICD-10-CM | POA: Diagnosis not present

## 2022-05-04 DIAGNOSIS — K5900 Constipation, unspecified: Secondary | ICD-10-CM | POA: Diagnosis not present

## 2022-05-04 DIAGNOSIS — M5136 Other intervertebral disc degeneration, lumbar region: Secondary | ICD-10-CM | POA: Diagnosis not present

## 2022-05-04 DIAGNOSIS — G894 Chronic pain syndrome: Secondary | ICD-10-CM | POA: Diagnosis not present

## 2022-05-04 DIAGNOSIS — E559 Vitamin D deficiency, unspecified: Secondary | ICD-10-CM | POA: Diagnosis not present

## 2022-05-04 DIAGNOSIS — D509 Iron deficiency anemia, unspecified: Secondary | ICD-10-CM | POA: Diagnosis not present

## 2022-05-04 DIAGNOSIS — M1610 Unilateral primary osteoarthritis, unspecified hip: Secondary | ICD-10-CM | POA: Diagnosis not present

## 2022-05-04 DIAGNOSIS — R7303 Prediabetes: Secondary | ICD-10-CM | POA: Diagnosis not present

## 2022-05-04 DIAGNOSIS — G4700 Insomnia, unspecified: Secondary | ICD-10-CM | POA: Diagnosis not present

## 2022-05-04 DIAGNOSIS — R1013 Epigastric pain: Secondary | ICD-10-CM | POA: Diagnosis not present

## 2022-05-04 DIAGNOSIS — I10 Essential (primary) hypertension: Secondary | ICD-10-CM | POA: Diagnosis not present

## 2022-05-04 DIAGNOSIS — E876 Hypokalemia: Secondary | ICD-10-CM | POA: Diagnosis not present

## 2022-05-04 DIAGNOSIS — K449 Diaphragmatic hernia without obstruction or gangrene: Secondary | ICD-10-CM | POA: Diagnosis not present

## 2022-05-04 DIAGNOSIS — R262 Difficulty in walking, not elsewhere classified: Secondary | ICD-10-CM | POA: Diagnosis not present

## 2022-05-04 DIAGNOSIS — E785 Hyperlipidemia, unspecified: Secondary | ICD-10-CM | POA: Diagnosis not present

## 2022-05-04 DIAGNOSIS — K219 Gastro-esophageal reflux disease without esophagitis: Secondary | ICD-10-CM | POA: Diagnosis not present

## 2022-05-04 DIAGNOSIS — R1310 Dysphagia, unspecified: Secondary | ICD-10-CM | POA: Diagnosis not present

## 2022-05-04 DIAGNOSIS — M19011 Primary osteoarthritis, right shoulder: Secondary | ICD-10-CM | POA: Diagnosis not present

## 2022-05-04 DIAGNOSIS — G5703 Lesion of sciatic nerve, bilateral lower limbs: Secondary | ICD-10-CM | POA: Diagnosis not present

## 2022-05-05 ENCOUNTER — Ambulatory Visit: Payer: Self-pay

## 2022-05-05 DIAGNOSIS — F3160 Bipolar disorder, current episode mixed, unspecified: Secondary | ICD-10-CM

## 2022-05-05 DIAGNOSIS — R5381 Other malaise: Secondary | ICD-10-CM

## 2022-05-05 DIAGNOSIS — Z599 Problem related to housing and economic circumstances, unspecified: Secondary | ICD-10-CM

## 2022-05-06 DIAGNOSIS — I10 Essential (primary) hypertension: Secondary | ICD-10-CM | POA: Diagnosis not present

## 2022-05-06 DIAGNOSIS — R1310 Dysphagia, unspecified: Secondary | ICD-10-CM | POA: Diagnosis not present

## 2022-05-06 DIAGNOSIS — R1013 Epigastric pain: Secondary | ICD-10-CM | POA: Diagnosis not present

## 2022-05-06 DIAGNOSIS — R7303 Prediabetes: Secondary | ICD-10-CM | POA: Diagnosis not present

## 2022-05-06 DIAGNOSIS — G5703 Lesion of sciatic nerve, bilateral lower limbs: Secondary | ICD-10-CM | POA: Diagnosis not present

## 2022-05-06 DIAGNOSIS — E785 Hyperlipidemia, unspecified: Secondary | ICD-10-CM | POA: Diagnosis not present

## 2022-05-06 DIAGNOSIS — M1610 Unilateral primary osteoarthritis, unspecified hip: Secondary | ICD-10-CM | POA: Diagnosis not present

## 2022-05-06 DIAGNOSIS — K449 Diaphragmatic hernia without obstruction or gangrene: Secondary | ICD-10-CM | POA: Diagnosis not present

## 2022-05-06 DIAGNOSIS — R202 Paresthesia of skin: Secondary | ICD-10-CM | POA: Diagnosis not present

## 2022-05-06 DIAGNOSIS — G894 Chronic pain syndrome: Secondary | ICD-10-CM | POA: Diagnosis not present

## 2022-05-06 DIAGNOSIS — K219 Gastro-esophageal reflux disease without esophagitis: Secondary | ICD-10-CM | POA: Diagnosis not present

## 2022-05-06 DIAGNOSIS — M19011 Primary osteoarthritis, right shoulder: Secondary | ICD-10-CM | POA: Diagnosis not present

## 2022-05-06 DIAGNOSIS — G4700 Insomnia, unspecified: Secondary | ICD-10-CM | POA: Diagnosis not present

## 2022-05-06 DIAGNOSIS — M5136 Other intervertebral disc degeneration, lumbar region: Secondary | ICD-10-CM | POA: Diagnosis not present

## 2022-05-06 DIAGNOSIS — E559 Vitamin D deficiency, unspecified: Secondary | ICD-10-CM | POA: Diagnosis not present

## 2022-05-06 DIAGNOSIS — E876 Hypokalemia: Secondary | ICD-10-CM | POA: Diagnosis not present

## 2022-05-06 DIAGNOSIS — D509 Iron deficiency anemia, unspecified: Secondary | ICD-10-CM | POA: Diagnosis not present

## 2022-05-06 DIAGNOSIS — F17210 Nicotine dependence, cigarettes, uncomplicated: Secondary | ICD-10-CM | POA: Diagnosis not present

## 2022-05-06 DIAGNOSIS — R262 Difficulty in walking, not elsewhere classified: Secondary | ICD-10-CM | POA: Diagnosis not present

## 2022-05-06 DIAGNOSIS — K5900 Constipation, unspecified: Secondary | ICD-10-CM | POA: Diagnosis not present

## 2022-05-11 ENCOUNTER — Other Ambulatory Visit (HOSPITAL_COMMUNITY): Payer: Self-pay | Admitting: Psychiatry

## 2022-05-11 DIAGNOSIS — F319 Bipolar disorder, unspecified: Secondary | ICD-10-CM

## 2022-05-11 DIAGNOSIS — F4310 Post-traumatic stress disorder, unspecified: Secondary | ICD-10-CM

## 2022-05-12 ENCOUNTER — Other Ambulatory Visit (HOSPITAL_COMMUNITY): Payer: Self-pay | Admitting: Psychiatry

## 2022-05-12 DIAGNOSIS — F319 Bipolar disorder, unspecified: Secondary | ICD-10-CM

## 2022-05-14 ENCOUNTER — Telehealth: Payer: Self-pay | Admitting: Family Medicine

## 2022-05-14 NOTE — Telephone Encounter (Signed)
Flor, an OT from [employer] called and said patient has told them that she no longer wants to do PT/OT. They are worried because the patient has previously said that when she gets depressed, she distaces herself from everyone.They are suggesting she takes care of her mental health before she continues with OT/PT because it is affecting her treatment. She has canceled and rescheduled multiple appointments.  Flor left her number for further clarification, if needed. Her number is [phone](secure).

## 2022-05-14 NOTE — Telephone Encounter (Signed)
Called pt no answer LMOM RTC.../lmb 

## 2022-05-15 ENCOUNTER — Ambulatory Visit: Payer: Self-pay

## 2022-05-15 ENCOUNTER — Telehealth (HOSPITAL_COMMUNITY): Payer: Medicare Other | Admitting: Psychiatry

## 2022-05-15 DIAGNOSIS — F3160 Bipolar disorder, current episode mixed, unspecified: Secondary | ICD-10-CM

## 2022-05-15 NOTE — Telephone Encounter (Signed)
Called pt gave her Jocelyn Lamer response. Made appt for 05/27/22 @ 4:40 virtually.Marland KitchenJohny Chess

## 2022-05-15 NOTE — Chronic Care Management (AMB) (Signed)
  Chronic Care Management   CCM RN Visit Note  05/15/2022 Name: Shannon [name] MRN: [id number] DOB: 02/11/1966  Subjective: Shannon [name] is a 57 y.o. year old female who is a primary care patient of Suezanne Jacquet, [name], NP-C. The patient was referred to the Chronic Care Management team for assistance with care management needs subsequent to provider initiation of CCM services and plan of care.    Today's Visit:  Engaged with patient by telephone for follow up visit.       Goals Addressed             This Visit's Progress    Goal: CCM (Mixed Bipolar Disorder) Expected Outcome:  Monitor, Self-Manage And Reduce Symptoms of Mixed Bipolar Disorder       Current Barriers:  Chronic Disease Management support and education needs related to Mixed Bipolar Disorder Lacks caregiver support.  Corporate treasurer.  Transportation barriers  Planned Interventions: Reviewed plan for treatment of Mixed Bipolar Disorder. Reviewed symptoms. Reports several episodes of "self-sabotaging" since the last outreach. Reports change in mood was related to being overwhelmed with changing plans as well as need to have additional conversations with her soon to be ex-spouse regarding finances. Active Listening and discussion regarding support and plan for therapy. Denies suicidal ideation. Support group remains rather limited but reports plans to start attending weekly prayer.  Discussed missed appointment with [name]/Psychiatry. Reports missing the scheduled call earlier this week and missing the appointment today. Plans to reach out to the team to reschedule. Reports medications were delivered as scheduled earlier this week. Reports taking all medications as prescribed. Discussed missed appointments with the therapy team at [facility]. Prefers to hold therapy d/t plans to practice ambulating outside of the home. Reports current plan is to practice ambulating up and down the stairs. Reports she is still  very fearful of a fall and discussed plan the team to hold therapy. No changes in plan to remain in [place] until August. Reports discussing with her sister-in-law earlier this week. Discussed status of APS case. Scheduled for visit with the APS Case Worker on 05/18/22.    Symptom Management: Take medications as prescribed   Contact Dr. Sheela Stack team to reschedule Psychiatry appointment. Call pharmacy for medication refills 3-7 days in advance of running out of medications Work with the care management team to address care coordination needs and will continue to work with the clinical team to address health care and disease management related needs Call provider office for new concerns or questions  Call the Suicide and Crisis Lifeline: 988 Call the [place] National Suicide Prevention Lifeline: 786-463-9093 or TTY: 434-672-6000 TTY (956)442-5036) to talk to a trained counselor Call 1-800-273-TALK (toll free, 24 hour hotline) Go to [facility] [address] [place] [phone]) Call 911 if experiencing a Mental Health or Behavioral Health Crisis   Follow Up Plan: Will follow up this month          PLAN: Will follow up next week   [facility]/Chronic Care Management [phone]

## 2022-05-18 ENCOUNTER — Telehealth (HOSPITAL_BASED_OUTPATIENT_CLINIC_OR_DEPARTMENT_OTHER): Payer: Medicare Other | Admitting: Psychiatry

## 2022-05-18 ENCOUNTER — Encounter (HOSPITAL_COMMUNITY): Payer: Self-pay | Admitting: Psychiatry

## 2022-05-18 VITALS — Wt 223.0 lb

## 2022-05-18 DIAGNOSIS — F4310 Post-traumatic stress disorder, unspecified: Secondary | ICD-10-CM | POA: Diagnosis not present

## 2022-05-18 DIAGNOSIS — F609 Personality disorder, unspecified: Secondary | ICD-10-CM | POA: Diagnosis not present

## 2022-05-18 DIAGNOSIS — F319 Bipolar disorder, unspecified: Secondary | ICD-10-CM

## 2022-05-18 MED ORDER — QUETIAPINE FUMARATE 25 MG PO TABS: 25.0000 mg | ORAL_TABLET | Freq: Two times a day (BID) | ORAL | 2 refills | Status: DC

## 2022-05-18 MED ORDER — QUETIAPINE FUMARATE 300 MG PO TABS: 300.0000 mg | ORAL_TABLET | Freq: Every day | ORAL | 2 refills | Status: DC

## 2022-05-18 MED ORDER — CLONAZEPAM 0.5 MG PO TABS: 0.5000 mg | ORAL_TABLET | Freq: Two times a day (BID) | ORAL | 2 refills | Status: DC | PRN

## 2022-05-18 MED ORDER — FLUOXETINE HCL 20 MG PO CAPS: 20.0000 mg | ORAL_CAPSULE | Freq: Every day | ORAL | 2 refills | Status: DC
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
## 2022-05-19 ENCOUNTER — Ambulatory Visit: Payer: Self-pay

## 2022-05-19 DIAGNOSIS — F3160 Bipolar disorder, current episode mixed, unspecified: Secondary | ICD-10-CM
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
## 2022-05-20 DIAGNOSIS — K449 Diaphragmatic hernia without obstruction or gangrene: Secondary | ICD-10-CM | POA: Diagnosis not present

## 2022-05-20 DIAGNOSIS — E559 Vitamin D deficiency, unspecified: Secondary | ICD-10-CM | POA: Diagnosis not present

## 2022-05-20 DIAGNOSIS — K5900 Constipation, unspecified: Secondary | ICD-10-CM | POA: Diagnosis not present

## 2022-05-20 DIAGNOSIS — G4700 Insomnia, unspecified: Secondary | ICD-10-CM | POA: Diagnosis not present

## 2022-05-20 DIAGNOSIS — E785 Hyperlipidemia, unspecified: Secondary | ICD-10-CM | POA: Diagnosis not present

## 2022-05-20 DIAGNOSIS — F17210 Nicotine dependence, cigarettes, uncomplicated: Secondary | ICD-10-CM | POA: Diagnosis not present

## 2022-05-20 DIAGNOSIS — K219 Gastro-esophageal reflux disease without esophagitis: Secondary | ICD-10-CM | POA: Diagnosis not present

## 2022-05-20 DIAGNOSIS — M5136 Other intervertebral disc degeneration, lumbar region: Secondary | ICD-10-CM | POA: Diagnosis not present

## 2022-05-20 DIAGNOSIS — M19011 Primary osteoarthritis, right shoulder: Secondary | ICD-10-CM | POA: Diagnosis not present

## 2022-05-20 DIAGNOSIS — D509 Iron deficiency anemia, unspecified: Secondary | ICD-10-CM | POA: Diagnosis not present

## 2022-05-20 DIAGNOSIS — R1013 Epigastric pain: Secondary | ICD-10-CM | POA: Diagnosis not present

## 2022-05-20 DIAGNOSIS — R1310 Dysphagia, unspecified: Secondary | ICD-10-CM | POA: Diagnosis not present

## 2022-05-20 DIAGNOSIS — M1610 Unilateral primary osteoarthritis, unspecified hip: Secondary | ICD-10-CM | POA: Diagnosis not present

## 2022-05-20 DIAGNOSIS — R7303 Prediabetes: Secondary | ICD-10-CM | POA: Diagnosis not present

## 2022-05-20 DIAGNOSIS — R202 Paresthesia of skin: Secondary | ICD-10-CM | POA: Diagnosis not present

## 2022-05-20 DIAGNOSIS — G5703 Lesion of sciatic nerve, bilateral lower limbs: Secondary | ICD-10-CM | POA: Diagnosis not present

## 2022-05-20 DIAGNOSIS — G894 Chronic pain syndrome: Secondary | ICD-10-CM | POA: Diagnosis not present

## 2022-05-20 DIAGNOSIS — R262 Difficulty in walking, not elsewhere classified: Secondary | ICD-10-CM | POA: Diagnosis not present

## 2022-05-20 DIAGNOSIS — I10 Essential (primary) hypertension: Secondary | ICD-10-CM | POA: Diagnosis not present

## 2022-05-20 DIAGNOSIS — E876 Hypokalemia: Secondary | ICD-10-CM | POA: Diagnosis not present

## 2022-05-21 DIAGNOSIS — R1013 Epigastric pain: Secondary | ICD-10-CM | POA: Diagnosis not present

## 2022-05-21 DIAGNOSIS — R7303 Prediabetes: Secondary | ICD-10-CM | POA: Diagnosis not present

## 2022-05-21 DIAGNOSIS — I10 Essential (primary) hypertension: Secondary | ICD-10-CM | POA: Diagnosis not present

## 2022-05-21 DIAGNOSIS — R1310 Dysphagia, unspecified: Secondary | ICD-10-CM | POA: Diagnosis not present

## 2022-05-21 DIAGNOSIS — D509 Iron deficiency anemia, unspecified: Secondary | ICD-10-CM | POA: Diagnosis not present

## 2022-05-21 DIAGNOSIS — R262 Difficulty in walking, not elsewhere classified: Secondary | ICD-10-CM | POA: Diagnosis not present

## 2022-05-21 DIAGNOSIS — F17210 Nicotine dependence, cigarettes, uncomplicated: Secondary | ICD-10-CM | POA: Diagnosis not present

## 2022-05-21 DIAGNOSIS — K219 Gastro-esophageal reflux disease without esophagitis: Secondary | ICD-10-CM | POA: Diagnosis not present

## 2022-05-21 DIAGNOSIS — G5703 Lesion of sciatic nerve, bilateral lower limbs: Secondary | ICD-10-CM | POA: Diagnosis not present

## 2022-05-21 DIAGNOSIS — M5136 Other intervertebral disc degeneration, lumbar region: Secondary | ICD-10-CM | POA: Diagnosis not present

## 2022-05-21 DIAGNOSIS — K449 Diaphragmatic hernia without obstruction or gangrene: Secondary | ICD-10-CM | POA: Diagnosis not present

## 2022-05-21 DIAGNOSIS — M19011 Primary osteoarthritis, right shoulder: Secondary | ICD-10-CM | POA: Diagnosis not present

## 2022-05-21 DIAGNOSIS — E876 Hypokalemia: Secondary | ICD-10-CM | POA: Diagnosis not present

## 2022-05-21 DIAGNOSIS — E785 Hyperlipidemia, unspecified: Secondary | ICD-10-CM | POA: Diagnosis not present

## 2022-05-21 DIAGNOSIS — E559 Vitamin D deficiency, unspecified: Secondary | ICD-10-CM | POA: Diagnosis not present

## 2022-05-21 DIAGNOSIS — G894 Chronic pain syndrome: Secondary | ICD-10-CM | POA: Diagnosis not present

## 2022-05-21 DIAGNOSIS — K5900 Constipation, unspecified: Secondary | ICD-10-CM | POA: Diagnosis not present

## 2022-05-21 DIAGNOSIS — M1610 Unilateral primary osteoarthritis, unspecified hip: Secondary | ICD-10-CM | POA: Diagnosis not present

## 2022-05-21 DIAGNOSIS — R202 Paresthesia of skin: Secondary | ICD-10-CM | POA: Diagnosis not present

## 2022-05-21 DIAGNOSIS — G4700 Insomnia, unspecified: Secondary | ICD-10-CM | POA: Diagnosis not present

## 2022-05-22 ENCOUNTER — Telehealth: Payer: Self-pay

## 2022-05-22 NOTE — Telephone Encounter (Signed)
   CCM RN Visit Note   05/22/22 Name: ONNIKA GIESEN MRN: [id number]      DOB: [birth year]  Subjective: Shannon [name] is a 57 y.o. year old female who is a primary care patient of [name], Laurian Brim, NP. The patient was referred to the Chronic Care Management team for assistance with care management needs subsequent to provider initiation of CCM services and plan of care.      An unsuccessful outreach attempt was made today.  PLAN: A HIPAA compliant phone message was left for the patient providing contact information and requesting a return call.   Horris Latino RN Care Manager/Chronic Care Management [phone]

## 2022-05-26 ENCOUNTER — Telehealth: Payer: Self-pay | Admitting: Family Medicine

## 2022-05-26 NOTE — Telephone Encounter (Signed)
Ok for PT verbal orders? 

## 2022-05-26 NOTE — Telephone Encounter (Signed)
Shannon [name] with [facility] calls today in regards to continuation of physical therapy orders. The frequency of these new orders being   Once a week for eight weeks  These orders can be given verbally at   [facility]: [phone]  VM is secure

## 2022-05-27 ENCOUNTER — Telehealth: Payer: Medicare Other | Admitting: Family Medicine

## 2022-05-27 ENCOUNTER — Encounter: Payer: Self-pay | Admitting: Family Medicine

## 2022-05-27 NOTE — Progress Notes (Signed)
MyChart Video Visit    Virtual Visit via Video Note    Patient location: Home. Patient and provider in visit Provider location: Office  I discussed the limitations of evaluation and management by telemedicine and the availability of in person appointments. The patient expressed understanding and agreed to proceed.  Visit Date: 05/27/2022  Today's healthcare provider: Harland Dingwall, NP-C     Subjective:    Patient ID: Shannon [name], female    DOB: November 05, 1965, 57 y.o.   MRN: [id number]  Chief Complaint  Patient presents with   Depression    HPI   Visit not done. She was unable to get on visit.   Past Medical History:  Diagnosis Date   Allergic rhinitis    Allergic rhinitis 01/25/2009        ANEMIA-IRON DEFICIENCY 01/25/2009   Anxiety    Backache 01/25/2009   Qualifier: Diagnosis of  By: Jenny Reichmann MD, Hunt Oris    Bipolar 1 disorder    Chronic pain syndrome    Complete rotator cuff tear 07/21/2013   Complete tear of right rotator cuff 07/21/2013   Contact lens/glasses fitting    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   DISC DISEASE, LUMBAR 01/25/2009   Essential hypertension [id number]   On Bystolic    GAD (generalized anxiety disorder) 12/28/2013   GERD (gastroesophageal reflux disease)    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   Headache, acute 12/20/2013   Hx of laparoscopic gastric banding 09/03/2010   Surgery date: 07/17/10    HYPERLIPIDEMIA 01/25/2009   Hyperlipidemia with target LDL less than 130 01/25/2009   HYPERTENSION 01/25/2009   Hypokalemia, inadequate intake 09/01/2011   Insomnia 04/04/2011   INSOMNIA-SLEEP DISORDER-UNSPEC 04/30/2009   Qualifier: Diagnosis of  By: Jenny Reichmann MD, Hunt Oris    Iron deficiency anemia 01/25/2009        Labyrinthitis 02/19/2014   Leiomyoma of uterus, unspecified 08/13/2008   Overview:  Leiomyoma Of The Uterus  10/1 IMO update   Localized edema 06/12/2015   MANIC DEPRESSIVE ILLNESS 01/25/2009   pt is unsue if this is her specifc dx    Mixed bipolar I disorder 07/12/2012   Morbid obesity 01/25/2009   Night sweats    Osteopenia determined by x-ray 06/13/2021   Other abnormal glucose 10/26/2012   Palpitations 10/25/2013   PEPTIC ULCER DISEASE, HELICOBACTER PYLORI POSITIVE 10/03/2009   Peripheral edema 06/25/2015   Pernicious anemia 03/28/2012   Piriformis syndrome of both sides 09/02/2018   PUD (peptic ulcer disease) 10/03/2009        Right shoulder pain 05/03/2013   Dg Shoulder Right  05/03/2013   CLINICAL DATA Pain.  EXAM RIGHT SHOULDER - 2+ VIEW  COMPARISON None.  FINDINGS Acromioclavicular and glenohumeral degenerative change present. Questionable calcific density noted in the region of the supraspinatus space, possibly representing calcific supraspinatus tendinitis. This could represent a sclerotic density in the acromion. MRI of the right shoulder suggest   Sleep apnea 08/12/2016   SMOKER 01/25/2009   Qualifier: Diagnosis of  By: Jenny Reichmann MD, Hunt Oris    Subacromial bursitis 05/17/2013   SUBSTANCE ABUSE 11/06/2009        Thiamin deficiency 10/30/2012   Tobacco abuse 06/09/2010   Visual disturbance 05/03/2013   Vitamin D deficiency 10/27/2012    Past Surgical History:  Procedure Laterality Date   ABDOMINAL HYSTERECTOMY     BLADDER SURGERY     s/p with ?diverticulitis   HAND TENDON SURGERY  1991   s/p-Right-index and middle  LAPAROSCOPIC GASTRIC BAND REMOVAL WITH LAPAROSCOPIC GASTRIC SLEEVE RESECTION N/A 08/17/2016   Procedure: LAPAROSCOPIC GASTRIC BAND REMOVAL WITH LAPAROSCOPIC GASTRIC SLEEVE RESECTION WITH UPPER ENDO;  Surgeon: Alphonsa Overall, MD;  Location: [facility];  Service: General;  Laterality: N/A;   LAPAROSCOPIC GASTRIC BANDING  07/14/10   SHOULDER ARTHROSCOPY Right 07/21/2013   Procedure: RIGHT ARTHROSCOPY SHOULDER DEBRIDMENT EXTENTSIVE,ARTHROSCOPIC REMOVE LOOSE FOREIGN BODY, BICEPS TENOLYSIS ;  Surgeon: Johnny Bridge, MD;  Location: [facility];  Service: Orthopedics;  Laterality: Right;   TUBAL LIGATION       Family History  Problem Relation Age of Onset   Hypertension Mother    Stroke Father    Cirrhosis Father        ETOH   Hypertension Father    Diabetes Father    Alcohol abuse Father    Hypertension Sister    Asthma Sister    Hypertension Brother    Hypertension Paternal Aunt    Diabetes Maternal Grandmother    ADD / ADHD Son    ADD / ADHD Other        3 nephews, also emotional issues   Diabetes Other        Uncle   Diabetes Other        Aunt   Suicidality Neg Hx     Social History   Socioeconomic History   Marital status: Married    Spouse name: Not on file   Number of children: 2   Years of education: Not on file   Highest education level: Not on file  Occupational History   Occupation: STUDENT    Employer: UNEMPLOYED  Tobacco Use   Smoking status: Every Day    Packs/day: 0.25    Years: 20.00    Additional pack years: 0.00    Total pack years: 5.00    Types: Cigarettes   Smokeless tobacco: Never   Tobacco comments:    States has cut back to 5-7 a day and working on this.   Vaping Use   Vaping Use: Never used  Substance and Sexual Activity   Alcohol use: No    Alcohol/week: 0.0 standard drinks of alcohol   Drug use: No   Sexual activity: Not Currently  Other Topics Concern   Not on file  Social History Narrative   Moved from [place], then [place] to [place] 2009   2 children-1 boy, 1 girl   Disabled-bipolar   Daily Caffeine Use-1 cup/day      Right Handed   Lives in 2nd floor condo   Social Determinants of Health   Financial Resource Strain: High Risk (01/23/2022)   Overall Financial Resource Strain (CARDIA)    Difficulty of Paying Living Expenses: Very hard  Food Insecurity: Food Insecurity Present (04/09/2022)   Hunger Vital Sign    Worried About Running Out of Food in the Last Year: Sometimes true    Ran Out of Food in the Last Year: Sometimes true  Transportation Needs: Unmet Transportation Needs (04/09/2022)   PRAPARE - Transportation     Lack of Transportation (Medical): Yes    Lack of Transportation (Non-Medical): Yes  Physical Activity: Inactive (04/07/2022)   Exercise Vital Sign    Days of Exercise per Week: 0 days    Minutes of Exercise per Session: 0 min  Stress: Stress Concern Present (04/07/2022)   Power    Feeling of Stress : Rather much  Social Connections: Moderately Isolated (10/07/2021)  Social Connection and Isolation Panel [NHANES]    Frequency of Communication with Friends and Family: Three times a week    Frequency of Social Gatherings with Friends and Family: Never    Attends Religious Services: Never    Marine scientist or Organizations: No    Attends Archivist Meetings: Never    Marital Status: Married  Human resources officer Violence: Not At Risk (10/07/2021)   Humiliation, Afraid, Rape, and Kick questionnaire    Fear of Current or Ex-Partner: No    Emotionally Abused: No    Physically Abused: No    Sexually Abused: No    Outpatient Medications Prior to Visit  Medication Sig Dispense Refill   amLODipine (NORVASC) 5 MG tablet Take 1 tablet (5 mg total) by mouth daily. (Patient not taking: Reported on 02/19/2022) 30 tablet 2   ARIPiprazole (ABILIFY) 5 MG tablet Take 1 tablet (5 mg total) by mouth daily. (Patient not taking: Reported on 02/19/2022) 30 tablet 1   atorvastatin (LIPITOR) 20 MG tablet Take 1 tablet (20 mg total) by mouth daily. 90 tablet 1   clonazePAM (KLONOPIN) 0.5 MG tablet Take 1 tablet (0.5 mg total) by mouth 2 (two) times daily as needed for anxiety. 60 tablet 2   cyclobenzaprine (FLEXERIL) 10 MG tablet Take 1 tablet (10 mg total) by mouth at bedtime as needed for muscle spasms. (Patient not taking: Reported on 04/07/2022) 15 tablet 0   FLUoxetine (PROZAC) 20 MG capsule Take 1 capsule (20 mg total) by mouth daily. 30 capsule 2   gabapentin (NEURONTIN) 300 MG capsule Take 1 capsule (300 mg total) by  mouth at bedtime. 90 capsule 1   linaclotide (LINZESS) 145 MCG CAPS capsule Take 1 capsule (145 mcg total) by mouth daily before breakfast. 30 capsule 2   meloxicam (MOBIC) 15 MG tablet Take 1 tablet (15 mg total) by mouth daily as needed for pain. (Patient not taking: Reported on 02/19/2022) 14 tablet 0   Multiple Vitamins-Minerals (BARIATRIC MULTIVITAMINS/IRON) CAPS Take 1 capsule by mouth daily. 30 capsule 2   pantoprazole (PROTONIX) 40 MG tablet Take 1 tablet (40 mg total) by mouth 2 (two) times daily before a meal. 180 tablet 0   polyethylene glycol powder (GLYCOLAX/MIRALAX) 17 GM/SCOOP powder Take entire bottle in 32 ounces of sports drink as directed 119 g 0   potassium chloride SA (KLOR-CON M) 20 MEQ tablet Take 1 tablet (20 mEq total) by mouth daily. 5 tablet 0   QUEtiapine (SEROQUEL) 25 MG tablet Take 1 tablet (25 mg total) by mouth 2 (two) times daily. 60 tablet 2   QUEtiapine (SEROQUEL) 300 MG tablet Take 1 tablet (300 mg total) by mouth at bedtime. 30 tablet 2   tiZANidine (ZANAFLEX) 4 MG tablet Take 1 tablet (4 mg total) by mouth at bedtime. 14 tablet 0   No facility-administered medications prior to visit.    Allergies  Allergen Reactions   Lamictal [Lamotrigine] Rash    ROS     Objective:    Physical Exam  There were no vitals taken for this visit. Wt Readings from Last 3 Encounters:  05/18/22 223 lb (101.2 kg)  04/07/22 223 lb (101.2 kg)  03/16/22 241 lb (109.3 kg)       Assessment & Plan:   Problem List Items Addressed This Visit   None Visit Diagnoses     Erroneous encounter - disregard    -  Primary      Unable to do visit.  Reviewed notes  from psychiatrist  Patient spoke with Jarrett Soho and states she needs the following:  Bedside toilet, shower chair and reaching stick. [facility], Shirlee Limerick will be sending orders.     I provided 0 minutes of face-to-face time during this encounter. Patient's camera did not work and unable to do visit.    Harland Dingwall, NP-C [facility] [place] [phone] (phone) [phone] (fax)  [place]

## 2022-05-27 NOTE — Telephone Encounter (Signed)
Called and gave Shannon [name] ok for verbal orders. She verbalized understanding

## 2022-05-29 ENCOUNTER — Ambulatory Visit (INDEPENDENT_AMBULATORY_CARE_PROVIDER_SITE_OTHER): Payer: Medicare Other

## 2022-05-29 DIAGNOSIS — F3160 Bipolar disorder, current episode mixed, unspecified: Secondary | ICD-10-CM

## 2022-05-29 NOTE — Progress Notes (Signed)
Error/ Duplicate

## 2022-06-03 ENCOUNTER — Telehealth: Payer: Self-pay

## 2022-06-03 NOTE — Telephone Encounter (Signed)
LM for pt in regards to incoming fax from [facility] that patient has missed another appt. Asked her to please give [name] a call and let us know how she is doing, just wanted to check in on her. Provided office number.

## 2022-06-03 NOTE — Telephone Encounter (Signed)
Shannon [name] called back to report they tried to knock on pt's door and she did not respond but will try to give her a call, she did not pick up their call so after the 2x knocking on door she answered. She let them know she is fine and that she does not need any help. When they asked about her missed PT appts she told the officers that she "doesn't need any physical therapy as it is not helping." She reports she is just depressed as Ramadan is hard on her at this time, otherwise she is fine. The officers offered a f/u up to check on her to which she refused.

## 2022-06-03 NOTE — Telephone Encounter (Signed)
[employer] was called to do a wellness check on pt due to missed PT appts and multiple unanswered phone calls. They will be sending an officer to check on pt and report back.

## 2022-06-05 ENCOUNTER — Ambulatory Visit: Payer: Self-pay

## 2022-06-05 DIAGNOSIS — F3160 Bipolar disorder, current episode mixed, unspecified: Secondary | ICD-10-CM

## 2022-06-05 NOTE — Chronic Care Management (AMB) (Signed)
  Chronic Care Management   CCM RN Visit Note  06/05/2022 Name: Shannon [name] MRN: [id number] DOB: 1965-04-02  Subjective: Shannon [name] is a 57 y.o. year old female who is a primary care patient of Suezanne Jacquet, [name], NP-C. The patient was referred to the Chronic Care Management team for assistance with care management needs subsequent to provider initiation of CCM services and plan of care.    Today's Visit:  Engaged with patient by telephone for follow up visit.       Goals Addressed             This Visit's Progress    Goal: CCM (Mixed Bipolar Disorder) Expected Outcome:  Monitor, Self-Manage And Reduce Symptoms of Mixed Bipolar Disorder       Current Barriers:  Chronic Disease Management support and education needs related to Mixed Bipolar Disorder Lacks caregiver support.  Corporate treasurer.  Transportation barriers  Planned Interventions: Reviewed plan for treatment of Mixed Bipolar Disorder. Discussed Wellness Check that was requested this week d/t clinic staff and Home Health staff not being able to reach her. Confirmed being in the home when the Home Health team attempted visits but does not feel the need for additional PT or OT.  Indicated that she has been overwhelmed this week with outreach required for legal/pending divorce. Also reports several religious requirements that have caused fatigue. Reports increased depression earlier this week. Denies suicidal ideation. Reports continued fatigue but doing well today. Discussed plan for therapy and follow-up. Requested that Home Health services be discontinued. Prefers to complete clinic and care management follow up as needed. Will hold off on scheduled visits for the remainder of the month. Agreed to call for urgent concerns if needed prior to next outreach. Collaborated with PCP regarding plan. Thorough discussion regarding available resources and need to seek immediate medical attention or Crisis team if symptoms r/t  major depression return and worsen. Agreed to this plan. Confirmed that her property manager has permission and is able to enter the unit if an emergency arises.   Symptom Management: Take medications as prescribed   Attend medical appointments as scheduled Call pharmacy for medication refills 3-7 days in advance of running out of medications Work with the care management team to address care coordination needs and will continue to work with the clinical team to address health care and disease management related needs Call provider office for new concerns or questions  Call the Suicide and Crisis Lifeline: 988 Call the [place] National Suicide Prevention Lifeline: 318 548 8955 or TTY: 929-657-7994 TTY 820-304-9180) to talk to a trained counselor Call 1-800-273-TALK (toll free, 24 hour hotline) Go to [facility] [address], [place] [phone]) Call 911 if experiencing a Mental Health or Behavioral Health Crisis   Follow Up Plan: Will follow up next month          PLAN: Will follow up next month    [facility]/Chronic Care Management [phone]

## 2022-06-07 NOTE — Chronic Care Management (AMB) (Signed)
Chronic Care Management   CCM RN Visit Note   Name: Shannon [name] MRN: [id number] DOB: [birth year]  Subjective: Shannon [name] is a 57 y.o. year old female who is a primary care patient of Suezanne Jacquet, [name], NP-C. The patient was referred to the Chronic Care Management team for assistance with care management needs subsequent to provider initiation of CCM services and plan of care.    Today's Visit:  Engaged with patient by telephone for follow up visit.        Goals Addressed             This Visit's Progress    Goal: CCM (Hypertension) Expected Outcome: Monitor, Self-Manage and Reduce Symptoms of Hypertension       Current Barriers:  Chronic Disease Management support and education needs related to Hypertension Lacks caregiver support.  Corporate treasurer.  Transportation barriers  Planned Interventions: Reviewed plan for management of hypertension. Reports taking medications as prescribed. Continues to receive medication delivery from [facility]. Reviewed blood pressure parameters. Reports BP is being monitored during home health PT visits. Reports readings have been within range. Advised to maintain a log of readings to identify trends. Reviewed parameters and indications for notifying a provider. Reviewed symptoms. Denies chest pain, palpitations, or shortness of breath. Denies recent headaches or visual changes. Continues to experience fatigue which she notes is chronic. Activity tolerance remains limited d/t neuropathy.  Discussed nutritional intake. Reports decrease in appetite but otherwise doing well with nutritional intake. Attempting to maintain adequate hydration and focusing on intake of vegetables and lean proteins. Reviewed s/sx of heart attack, stroke and worsening symptoms that require immediate medical attention.   Symptom Management: Take medications as prescribed   Attend all scheduled provider appointments Call pharmacy for medication refills 3-7 days in  advance of running out of medications Call provider office for new concerns or questions  Continue to work with the care management team to address care coordination needs. Seek immediate medical attention for worsening symptoms   Follow Up Plan:  Will update next month       Goal: CCM (Mixed Bipolar Disorder) Expected Outcome:  Monitor, Self-Manage And Reduce Symptoms of Mixed Bipolar Disorder       Current Barriers:  Chronic Disease Management support and education needs related to Mixed Bipolar Disorder Lacks caregiver support.  Corporate treasurer.  Transportation barriers  Planned Interventions: Reviewed plan for treatment of Mixed Bipolar Disorder. Reports taking medications as prescribed. Reviewed pending appointments with [name]. Reports improvement with mood and doing "ok" today.  Requesting assistance with completing applications. Reports difficulty holding pens/pencils d/t neuropathy. Also having trouble completing ADLs. Requesting a home health nurse aide if available. Referral was submitted for addition of Occupational Therapy and Nursing Aide home health services. Reviewed safety and fall prevention measures.   Symptom Management: Take medications as prescribed   Attend all scheduled provider appointments Call pharmacy for medication refills 3-7 days in advance of running out of medications Work with the care management team to address care coordination needs and will continue to work with the clinical team to address health care and disease management related needs Call provider office for new concerns or questions  Call the Suicide and Crisis Lifeline: 988 Call the [place] National Suicide Prevention Lifeline: (540)495-9846 or TTY: (737)748-2147 TTY 754-413-7741) to talk to a trained counselor Call 1-800-273-TALK (toll free, 24 hour hotline) Go to [facility] [address] [place] [phone]) Call 911 if experiencing a  Mental Health or Behavioral Health Crisis   Follow Up Plan: Will follow up within the next week          PLAN: Will follow up within the next week.    Katha Cabal RN Care Manager/Chronic Care Management [phone]

## 2022-06-07 NOTE — Chronic Care Management (AMB) (Signed)
  Chronic Care Management   CCM RN Visit Note   Name: Shannon [name] MRN: [id number] DOB: 20-Jul-1965  Subjective: Shannon [name] is a 57 y.o. year old female who is a primary care patient of Suezanne Jacquet, [name], NP-C. The patient was referred to the Chronic Care Management team for assistance with care management needs subsequent to provider initiation of CCM services and plan of care.    Today's Visit:  Engaged with patient by telephone for follow up visit.        Goals Addressed             This Visit's Progress    Goal: CCM (Hypertension) Expected Outcome: Monitor, Self-Manage and Reduce Symptoms of Hypertension       Current Barriers:  Chronic Disease Management support and education needs related to Hypertension Lacks caregiver support.  Corporate treasurer.  Transportation barriers  Planned Interventions: Reviewed plan for management of hypertension.  Reports a fall today after returning from her Neurology appointment. Reports her spouse was available to assist her up the stairs. However, her lower extremities became week causing her to fall to her knees on the stairwell. Reports EMS was contacted and assisted her into her 2nd floor condo. Denies injuries. Reports she did not require further evaluation. Collaborated with PCP regarding fall. Expressed concerns regarding her elevated pulse during the Neurology visit. Shannon [name] prefers not to be evaluated in the Urgent care or Emergency Department but agreed to call 911 if she experiences another fall. Requested to cancel her EGD and colonoscopy scheduled for 04/08/22. Collaborated with the Gastroenterology team to ensure procedures are cancelled for 04/08/22. Thorough discussion regarding safety and fall prevention measures.  Reviewed s/sx of heart attack, stroke and worsening symptoms that require immediate medical attention.   Symptom Management: Take medications as prescribed   Attend all scheduled provider  appointments Call pharmacy for medication refills 3-7 days in advance of running out of medications Call provider office for new concerns or questions  Continue to work with the care management team to address care coordination needs. Seek immediate medical attention for worsening symptoms            PLAN: Will follow up this week   Katha Cabal RN Care Manager/Chronic Care Management [phone]

## 2022-06-07 NOTE — Chronic Care Management (AMB) (Signed)
Chronic Care Management   CCM RN Visit Note   Name: Shannon [name] MRN: [id number] DOB: 03/08/1965  Subjective: Shannon [name] is a 57 y.o. year old female who is a primary care patient of Suezanne Jacquet, [name], NP-C. The patient was referred to the Chronic Care Management team for assistance with care management needs subsequent to provider initiation of CCM services and plan of care.    Today's Visit:  Engaged with patient by telephone for follow up visit.        Goals Addressed             This Visit's Progress    Goal: CCM (Hypertension) Expected Outcome: Monitor, Self-Manage and Reduce Symptoms of Hypertension       Current Barriers:  Chronic Disease Management support and education needs related to Hypertension Lacks caregiver support.  Corporate treasurer.  Transportation barriers  Planned Interventions: Reviewed plan for management of hypertension.  Reports a fall today after returning from her Neurology appointment. Reports her spouse was available to assist her up the stairs. However, her lower extremities became week causing her to fall to her knees on the stairwell. Reports EMS was contacted and assisted her into her 2nd floor condo. Denies injuries. Reports she did not require further evaluation. Collaborated with PCP regarding fall. Expressed concerns regarding her elevated pulse during the Neurology visit. Shannon [name] prefers not to be evaluated in the Urgent care or Emergency Department but agreed to call 911 if she experiences another fall. Requested to cancel her EGD and colonoscopy scheduled for 04/08/22. Collaborated with the Gastroenterology team to ensure procedures are cancelled for 04/08/22. Thorough discussion regarding safety and fall prevention measures.  Reviewed s/sx of heart attack, stroke and worsening symptoms that require immediate medical attention.   Symptom Management: Take medications as prescribed   Attend all scheduled provider  appointments Call pharmacy for medication refills 3-7 days in advance of running out of medications Call provider office for new concerns or questions  Continue to work with the care management team to address care coordination needs. Seek immediate medical attention for worsening symptoms   Follow Up Plan:  Will update next month       Goal: CCM (Mixed Bipolar Disorder) Expected Outcome:  Monitor, Self-Manage And Reduce Symptoms of Mixed Bipolar Disorder       Current Barriers:  Chronic Disease Management support and education needs related to Mixed Bipolar Disorder Lacks caregiver support.  Corporate treasurer.  Transportation barriers  Planned Interventions: Reviewed plan for treatment of Mixed Bipolar Disorder. Reviewed plan for housing Completed conference call with Eber Jones (Admin) from [employer]. Confirmed receiving provider's letter regarding disability and need for handicap accommodations. Reports that the building is equipped with an elevator which would allow patient options to reside on a higher level. Attempted to confirm patient's approval for an apartment today, however Eber Jones indicated that the Sales promotion account executive were not available. Reports message would be relayed to the property managers to call when they are available. Reviewed plan regarding family support. Reports her brother, niece and sister-in-law are still willing to assist if she relocates to [place]. She feels that her ability to ambulate and complete ADLs will improve after completing prescribed PT and OT. Reviewed safety and fall prevention measures.  Update 04/10/22:  Follow-up outreach with Eber Jones (Admin) from [employer]. Shannon [name] requested a second letter from her PCP to confirm that she could live independently. She anticipates family in [place] being available to assist if needed. Eber Jones confirmed  receipt of the second PCP letter. However, she was unable to confirm that Shannon [name] is eligible or if she  will be placed on a hold/waiting list. Eber Jones was informed that Shannon [name] did not receive return calls from the assigned property managers. Reports that she will relay a message to [employer] Building control surveyor at the facility) to return call and update Shannon [name] of her status.   Symptom Management: Take medications as prescribed   Attend all scheduled provider appointments Call pharmacy for medication refills 3-7 days in advance of running out of medications Work with the care management team to address care coordination needs and will continue to work with the clinical team to address health care and disease management related needs Call provider office for new concerns or questions  Call the Suicide and Crisis Lifeline: 988 Call the [place] National Suicide Prevention Lifeline: (331) 130-4045 or TTY: 5311720201 TTY (270)086-0342) to talk to a trained counselor Call 1-800-273-TALK (toll free, 24 hour hotline) Go to [facility] [address], [place] [phone]) Call 911 if experiencing a Mental Health or Behavioral Health Crisis   Follow Up Plan: Will follow up next week          PLAN: Will follow up next week   Katha Cabal RN Care Manager/Chronic Care Management [phone]

## 2022-06-07 NOTE — Chronic Care Management (AMB) (Signed)
  Chronic Care Management   CCM RN Visit Note   Name: Shannon [name] MRN: [id number] DOB: 1965-04-25  Subjective: Shannon [name] is a 57 y.o. year old female who is a primary care patient of Suezanne Jacquet, [name], NP-C. The patient was referred to the Chronic Care Management team for assistance with care management needs subsequent to provider initiation of CCM services and plan of care.    Today's Visit:  Engaged with patient by telephone for follow up visit.        Goals Addressed             This Visit's Progress    Goal: CCM (Mixed Bipolar Disorder) Expected Outcome:  Monitor, Self-Manage And Reduce Symptoms of Mixed Bipolar Disorder       Current Barriers:  Chronic Disease Management support and education needs related to Mixed Bipolar Disorder Lacks caregiver support.  Corporate treasurer.  Transportation barriers  Planned Interventions: Reviewed plan for treatment of Mixed Bipolar Disorder. Reviewed plan for housing Completed conference call with Eber Jones (Admin) from [employer]. Confirmed receiving provider's letter regarding disability and need for handicap accommodations. Reports that the building is equipped with an elevator which would allow patient options to reside on a higher level. Attempted to confirm patient's approval for an apartment today, however Eber Jones indicated that the Sales promotion account executive were not available. Reports message would be relayed to the property managers to call when they are available. Reviewed plan regarding family support. Reports her brother, niece and sister-in-law are still willing to assist if she relocates to [place]. She feels that her ability to ambulate and complete ADLs will improve after completing prescribed PT and OT. Reviewed safety and fall prevention measures.  Update 04/13/22:  Ms. Saefong requested additional assistance with completing the housing application for [employer]. Reports being unable to complete and submit d/t pain  and neuropathy in her hands. She previously submitted an electronic copy of the application. However, the application will still require written signatures along with additional financial information. Discussed option for current property manager to assist. Unsure if she will be available d/t serving as primary caregiver for her spouse.  Collaborated with the [facility] team to discuss availability of a Child psychotherapist. Confirmed having a Child psychotherapist that can assist. Unable to confirm the actual availability at the time of the call. Message relayed to PCP regarding order for a Home Health Social Worker.  Symptom Management: Take medications as prescribed   Attend all scheduled provider appointments Call pharmacy for medication refills 3-7 days in advance of running out of medications Work with the care management team to address care coordination needs and will continue to work with the clinical team to address health care and disease management related needs Call provider office for new concerns or questions  Call the Suicide and Crisis Lifeline: 988 Call the [place] National Suicide Prevention Lifeline: 5136395450 or TTY: 870-582-2099 TTY 334-247-9796) to talk to a trained counselor Call 1-800-273-TALK (toll free, 24 hour hotline) Go to [facility] [address], [place] [phone]) Call 911 if experiencing a Mental Health or Behavioral Health Crisis   Follow Up Plan: Will follow up within the next week          PLAN: Will follow up within the next week   Katha Cabal RN Care Manager/Chronic Care Management [phone]

## 2022-06-07 NOTE — Chronic Care Management (AMB) (Signed)
  Chronic Care Management   CCM RN Visit Note   Name: Shannon [name] MRN: [id number] DOB: 1965-08-01  Subjective: Shannon [name] is a 57 y.o. year old female who is a primary care patient of Suezanne Jacquet, [name], NP-C. The patient was referred to the Chronic Care Management team for assistance with care management needs subsequent to provider initiation of CCM services and plan of care.    Today's Visit:  Engaged with patient by telephone for follow up visit.        Goals Addressed             This Visit's Progress    Goal: CCM (Mixed Bipolar Disorder) Expected Outcome:  Monitor, Self-Manage And Reduce Symptoms of Mixed Bipolar Disorder       Current Barriers:  Chronic Disease Management support and education needs related to Mixed Bipolar Disorder Lacks caregiver support.  Corporate treasurer.  Transportation barriers  Planned Interventions: Reviewed plan for treatment of Mixed Bipolar Disorder. Reviewed plan for housing Completed conference call with Eber Jones (Admin) from [employer]. Confirmed receiving provider's letter regarding disability and need for handicap accommodations. Reports that the building is equipped with an elevator which would allow patient options to reside on a higher level. Attempted to confirm patient's approval for an apartment today, however Eber Jones indicated that the Sales promotion account executive were not available. Reports message would be relayed to the property managers to call when they are available. Reviewed plan regarding family support. Reports her brother, niece and sister-in-law are still willing to assist if she relocates to [place]. She feels that her ability to ambulate and complete ADLs will improve after completing prescribed PT and OT. Reviewed safety and fall prevention measures.    Symptom Management: Take medications as prescribed   Attend all scheduled provider appointments Call pharmacy for medication refills 3-7 days in advance of running out  of medications Work with the care management team to address care coordination needs and will continue to work with the clinical team to address health care and disease management related needs Call provider office for new concerns or questions  Call the Suicide and Crisis Lifeline: 988 Call the [place] National Suicide Prevention Lifeline: (765)122-7886 or TTY: (616)469-3450 TTY 765-595-6267) to talk to a trained counselor Call 1-800-273-TALK (toll free, 24 hour hotline) Go to [facility] [address], [place] [phone]) Call 911 if experiencing a Mental Health or Behavioral Health Crisis   Follow Up Plan: Will follow up this week          PLAN: Will follow up within the next week   Katha Cabal RN Care Manager/Chronic Care Management [phone]

## 2022-06-07 NOTE — Chronic Care Management (AMB) (Signed)
  Chronic Care Management   CCM RN Visit Note   Name: FINLEIGH VANCLEAVE MRN: [id number] DOB: [birth year]  Subjective: ZANARIA NIEUWENHUIS is a 57 y.o. year old female who is a primary care patient of Suezanne Jacquet, [name], NP-C. The patient was referred to the Chronic Care Management team for assistance with care management needs subsequent to provider initiation of CCM services and plan of care.    Today's Visit:  Engaged with patient by telephone for follow up visit.        Goals Addressed             This Visit's Progress    Goal: CCM (Mixed Bipolar Disorder) Expected Outcome:  Monitor, Self-Manage And Reduce Symptoms of Mixed Bipolar Disorder       Current Barriers:  Chronic Disease Management support and education needs related to Mixed Bipolar Disorder Lacks caregiver support.  Corporate treasurer.  Transportation barriers  Planned Interventions: Reviewed plan for treatment of Mixed Bipolar Disorder. Reviewed plan for housing Completed conference call with Eber Jones (Admin) from [employer]. Confirmed receiving provider's letter regarding disability and need for handicap accommodations. Reports that the building is equipped with an elevator which would allow patient options to reside on a higher level. Attempted to confirm patient's approval for an apartment today, however Eber Jones indicated that the Sales promotion account executive were not available. Reports message would be relayed to the property managers to call when they are available. Reviewed plan regarding family support. Reports her brother, niece and sister-in-law are still willing to assist if she relocates to [place]. She feels that her ability to ambulate and complete ADLs will improve after completing prescribed PT and OT. Reviewed safety and fall prevention measures.  Update 04/15/22:  Ms. Holderbaum reports not receiving an update from staff at [facility]. Attempted to reach the Investment banker, corporate today. Call was unsuccessful. Left a message  requesting a return call. Collaborated with Joni Reining, assigned APS Case Worker. Will complete a conference call with Joni Reining and [facility] to confirm Ms. Kincer's status on 04/16/22.  Symptom Management: Take medications as prescribed   Attend all scheduled provider appointments Call pharmacy for medication refills 3-7 days in advance of running out of medications Work with the care management team to address care coordination needs and will continue to work with the clinical team to address health care and disease management related needs Call provider office for new concerns or questions  Call the Suicide and Crisis Lifeline: 988 Call the [place] National Suicide Prevention Lifeline: 5095505175 or TTY: 548-107-0551 TTY 443-105-5338) to talk to a trained counselor Call 1-800-273-TALK (toll free, 24 hour hotline) Go to [facility] [address], [place] [phone]) Call 911 if experiencing a Mental Health or Behavioral Health Crisis          PLAN: Will complete conference call on 04/16/22   Katha Cabal RN Care Manager/Chronic Care Management [phone]

## 2022-06-07 NOTE — Chronic Care Management (AMB) (Signed)
  Chronic Care Management   CCM RN Visit Note   Name: Shannon [name] MRN: [id number] DOB: [birth year]  Subjective: Shannon [name] is a 57 y.o. year old female who is a primary care patient of Suezanne Jacquet, [name], NP-C. The patient was referred to the Chronic Care Management team for assistance with care management needs subsequent to provider initiation of CCM services and plan of care.    Today's Visit:  Engaged with patient by telephone for follow up visit.        Goals Addressed             This Visit's Progress    Goal: CCM (Mixed Bipolar Disorder) Expected Outcome:  Monitor, Self-Manage And Reduce Symptoms of Mixed Bipolar Disorder       Current Barriers:  Chronic Disease Management support and education needs related to Mixed Bipolar Disorder Lacks caregiver support.  Corporate treasurer.  Transportation barriers  Planned Interventions: Reviewed plan for treatment of Mixed Bipolar Disorder. Reports taking medications as prescribed. Reviewed pending appointments with [name]. Reports improvement with mood and doing "ok" today.  Requesting assistance with completing applications. Reports difficulty holding pens/pencils d/t neuropathy. Also having trouble completing ADLs. Requesting a home health nurse aide if available. Referral was submitted for addition of Occupational Therapy and Nursing Aide home health services. Update 04/03/22: Discussed plan for housing and family support. Pending feedback from [employer] in [place]) regarding application requirements. Reviewed pending appointments. Pending appointment with the Neurology team on 04/07/22. Pending EGD and colonoscopy on 04/08/22. Discussed need for transportation. She qualifies for transportation assistance. However, the drivers are not allowed to assist with stair ambulation. She is currently unable to ambulate up and down the stairs without assistance which has been an ongoing barrier. Reports she will contact  her spouse to transport her to these appointments. Reviewed safety and fall prevention measures. Update 04/06/22 Call received from the Neurology team to confirm that patient has transportation for her upcoming appointment on 04/07/22. Patient confirms that her spouse agreed to assist and will provide transportation. Agreed to follow up later this week.   Symptom Management: Take medications as prescribed   Attend all scheduled provider appointments Call pharmacy for medication refills 3-7 days in advance of running out of medications Work with the care management team to address care coordination needs and will continue to work with the clinical team to address health care and disease management related needs Call provider office for new concerns or questions  Call the Suicide and Crisis Lifeline: 988 Call the [place] National Suicide Prevention Lifeline: 737-096-5082 or TTY: 248-511-8532 TTY 579-176-6482) to talk to a trained counselor Call 1-800-273-TALK (toll free, 24 hour hotline) Go to [facility] [address] [place] [phone]) Call 911 if experiencing a Mental Health or Behavioral Health Crisis   Follow Up Plan: Will follow up this week          PLAN: Will follow up this week   Katha Cabal RN Care Manager/Chronic Care Management [phone]

## 2022-06-08 NOTE — Chronic Care Management (AMB) (Signed)
Chronic Care Management   CCM RN Visit Note   Name: Shannon [name] MRN: [id number] DOB: [birth year]  Subjective: Shannon [name] is a 57 y.o. year old female who is a primary care patient of Suezanne Jacquet, [name], NP-C. The patient was referred to the Chronic Care Management team for assistance with care management needs subsequent to provider initiation of CCM services and plan of care.    Today's Visit:  Engaged with patient by telephone for follow up visit.        Goals Addressed             This Visit's Progress    Goal: CCM (Mixed Bipolar Disorder) Expected Outcome:  Monitor, Self-Manage And Reduce Symptoms of Mixed Bipolar Disorder       Current Barriers:  Chronic Disease Management support and education needs related to Mixed Bipolar Disorder Lacks caregiver support.  Corporate treasurer.  Transportation barriers  Planned Interventions: Reviewed plan for treatment of Mixed Bipolar Disorder. Reviewed plan for housing Completed conference call with Eber Jones (Admin) from [employer]. Confirmed receiving provider's letter regarding disability and need for handicap accommodations. Reports that the building is equipped with an elevator which would allow patient options to reside on a higher level. Attempted to confirm patient's approval for an apartment today, however Eber Jones indicated that the Sales promotion account executive were not available. Reports message would be relayed to the property managers to call when they are available. Reviewed plan regarding family support. Reports her brother, niece and sister-in-law are still willing to assist if she relocates to [place]. She feels that her ability to ambulate and complete ADLs will improve after completing prescribed PT and OT. Reviewed safety and fall prevention measures.  Update 04/15/22:  Shannon [name] reports not receiving an update from staff at [facility]. Attempted to reach the Investment banker, corporate today. Call was unsuccessful. Left a message  requesting a return call. Collaborated with Joni Reining, assigned APS Case Worker. Will complete a conference call with Joni Reining and [facility] to confirm Shannon [name]'s status on 04/16/22. Update 04/17/22: Complete conference call with APS Case Worker and [employer] on 04/16/22. Property Manager confirmed reviewing the PCP letter and indicated Shannon [name] will not be eligible for residency at [facility]. Reports facility has a strict age requirement of 51 years and older. Provided information for [facility] that may accept residents under the age of 31. Call placed to [facility]. Staff confirmed that units are only available for member 65 years and older. Conference called several housing agencies. Shannon [name] meets the requirement for 55+ subsidized housing. Pending receipt of mailed applications.   Symptom Management: Take medications as prescribed   Attend all scheduled provider appointments Call pharmacy for medication refills 3-7 days in advance of running out of medications Work with the care management team to address care coordination needs and will continue to work with the clinical team to address health care and disease management related needs Call provider office for new concerns or questions  Call the Suicide and Crisis Lifeline: 988 Call the [place] National Suicide Prevention Lifeline: 727 874 8169 or TTY: 5637571824 TTY 817-049-6272) to talk to a trained counselor Call 1-800-273-TALK (toll free, 24 hour hotline) Go to [facility] [address], [place] [phone]) Call 911 if experiencing a Mental Health or Behavioral Health Crisis           PLAN: Will follow up next week   Katha Cabal RN Care Manager/Chronic Care Management [phone]

## 2022-06-10 ENCOUNTER — Telehealth: Payer: Self-pay

## 2022-06-10 ENCOUNTER — Other Ambulatory Visit: Payer: Self-pay | Admitting: Family Medicine

## 2022-06-10 DIAGNOSIS — E785 Hyperlipidemia, unspecified: Secondary | ICD-10-CM

## 2022-06-10 NOTE — Telephone Encounter (Signed)
Pt has stated she is out of her gabapentin (NEURONTIN) 300 MG capsule. Pt states she was taking her Gabapentin 2x a day for her neuropathy pain. She state she was taking one in the morning and one at night. The pharmacy informed pt she was to take it 1x a day and the pt has stated this was incorrect as she needs to take it 2x a day to help tolerate her pain.  Please give pt a call with update on above medication [phone]

## 2022-06-10 NOTE — Telephone Encounter (Signed)
Called pt and informed her that she will need to speak with Neurology as they are the ones prescribing the medication at this time. Provided [facility] number, pt verbalized understanding

## 2022-06-12 ENCOUNTER — Telehealth: Payer: Self-pay

## 2022-06-12 NOTE — Telephone Encounter (Signed)
   CCM RN Visit Note   06/12/22 Name: Shannon [name] MRN: [id number]      DOB: 22-Jan-1966  Subjective: Shannon [name] is a 57 y.o. year old female who is a primary care patient of Hetty Blend NP-C. The patient was referred to the Chronic Care Management team for assistance with care management needs subsequent to provider initiation of CCM services and plan of care.      An unsuccessful outreach was attempted today.  PLAN: A HIPAA compliant phone message was left for the patient providing contact information and requesting a return call.   Katha Cabal RN Care Manager/Chronic Care Management [phone]

## 2022-06-17 ENCOUNTER — Telehealth: Payer: Self-pay | Admitting: Neurology

## 2022-06-17 NOTE — Telephone Encounter (Signed)
New message  Can't get refill until  5/8  -- direction dosage for 1 day   Currently take one in am and one pm   1. Which medications need to be refilled? (please list name of each medication and dose if known) gabapentin (NEURONTIN) 300 MG capsule   2. Which pharmacy/location (including street and city if local pharmacy) is medication to be sent to?[facility] - [place], [place] - [address]   3. Do they need a 30 day or 90 day supply? 90 day supply

## 2022-06-18 MED ORDER — GABAPENTIN 300 MG PO CAPS: 300.0000 mg | ORAL_CAPSULE | Freq: Two times a day (BID) | ORAL | 1 refills | Status: DC

## 2022-06-18 NOTE — Telephone Encounter (Signed)
I will refill gabapentin to  twice daily.   MRI cervical spine was ordered at her last visit and has not been completed.  Please have patient follow-up with radiology to get this scheduled.

## 2022-06-18 NOTE — Telephone Encounter (Signed)
Please emphasize the importance of getting the imaging done as this is may change management and provide answers for her symptoms. Medications, such as gabapentin, is not trying the underlying problem.  Thank you.

## 2022-06-18 NOTE — Telephone Encounter (Signed)
Called patient and informed her that new increased rx of gabapentin has been sent in for her.   Asked patient about her MRI cervical. She stated that she had the appointment but had to cancel it due to transportation and her neuropathy being so bad. She stated that transportation will not help her up and down the stairs. Therefore patient has not been able to get to her MRI appointment.

## 2022-06-19 NOTE — Telephone Encounter (Signed)
Called patient and informed her of Dr. Eliane Decree advice on the importance of her getting her MRI done as this is may change management and provide  answers for her symptoms. Medications, such as  gabapentin, is not trying the underlying problem.  Patient verbalized understanding and has requested that I send her [facility] information on her Mychart. Patient thanked me for the call.

## 2022-06-23 DIAGNOSIS — F3160 Bipolar disorder, current episode mixed, unspecified: Secondary | ICD-10-CM

## 2022-07-10 ENCOUNTER — Ambulatory Visit (INDEPENDENT_AMBULATORY_CARE_PROVIDER_SITE_OTHER): Payer: Medicare Other

## 2022-07-10 DIAGNOSIS — F3160 Bipolar disorder, current episode mixed, unspecified: Secondary | ICD-10-CM

## 2022-07-10 NOTE — Chronic Care Management (AMB) (Signed)
  Chronic Care Management   CCM RN Visit Note  07/10/2022 Name: Shannon [name] MRN: [id number] DOB: 1965-06-11  Subjective: Shannon [name] is a 57 y.o. year old female who is a primary care patient of Suezanne Jacquet, [name], NP-C. The patient was referred to the Chronic Care Management team for assistance with care management needs subsequent to provider initiation of CCM services and plan of care.    Today's Visit:  Engaged with patient by telephone for follow up visit.     Goals Addressed             This Visit's Progress               Goal: CCM (Mixed Bipolar Disorder) Expected Outcome:  Monitor, Self-Manage And Reduce Symptoms of Mixed Bipolar Disorder       Current Barriers:  Chronic Disease Management support and education needs related to Mixed Bipolar Disorder Lacks caregiver support.  Corporate treasurer.  Transportation barriers  Planned Interventions: Discussed symptoms and treatment plan. Reports taking medications as prescribed and attending medical appointments as scheduled.  Reports doing well today. Reports significant improvement in symptoms and overall mood d/t recent updates and current plan for relocation.  Confirmed that legal concerns are being addressed and hoping that divorce will be final within the next few months. No changes in current financial situation. Denies new financial stressor. Reports current plan is to relocate to [place]. Reports thorough discussion with her friends and religious advisors regarding plan for transition in August. Discussed plan for support as she will require assistance during travel and during her stay in [place]. Confirmed that arrangements have been made for her to stay with a family. Current plan is to stay for approximately six months and return to the [name].  Confirmed that she will remain in her current rental unit until transition. Property manager remains very supportive and assisting when needed.  Denies urgent concerns. Will  continue current outreach plan to call as needed.  Discussed pending outreach with Psychiatrist in June. Reviewed resources and need to seek immediate medical attention or Crisis team if symptoms r/t major depression return and worsen.   Symptom Management: Take medications as prescribed   Attend medical appointments as scheduled Call pharmacy for medication refills 3-7 days in advance of running out of medications Work with the care management team to address care coordination needs and will continue to work with the clinical team to address health care and disease management related needs Call provider office for new concerns or questions  Call the Suicide and Crisis Lifeline: 988 Call the [place] National Suicide Prevention Lifeline: 925-149-6714 or TTY: 531 847 9599 TTY (581) 169-5811) to talk to a trained counselor Call 1-800-273-TALK (toll free, 24 hour hotline) Go to [facility] [address], [place] [id number]) Call 911 if experiencing a Mental Health or Behavioral Health Crisis   Follow Up Plan: Will contact the team for assistance as needed         PLAN: Patient will contact the team for assistance as needed   [facility]/Chronic Care Management [phone]

## 2022-07-15 ENCOUNTER — Other Ambulatory Visit (HOSPITAL_COMMUNITY): Payer: Self-pay | Admitting: Psychiatry

## 2022-07-15 ENCOUNTER — Other Ambulatory Visit: Payer: Self-pay | Admitting: Neurology

## 2022-07-15 DIAGNOSIS — F4310 Post-traumatic stress disorder, unspecified: Secondary | ICD-10-CM

## 2022-07-15 DIAGNOSIS — F319 Bipolar disorder, unspecified: Secondary | ICD-10-CM

## 2022-07-24 DIAGNOSIS — F3160 Bipolar disorder, current episode mixed, unspecified: Secondary | ICD-10-CM

## 2022-07-30 ENCOUNTER — Telehealth (HOSPITAL_COMMUNITY): Payer: Self-pay | Admitting: *Deleted

## 2022-07-30 ENCOUNTER — Telehealth: Payer: Self-pay | Admitting: Family Medicine

## 2022-07-30 NOTE — Telephone Encounter (Signed)
Patient states that she is going to [place] for a few months - she needs to discuss her medications and procedures to get them filled.  Please call patient at:  [phone]

## 2022-07-30 NOTE — Telephone Encounter (Signed)
Pt LVM stating that she will be moving to [place] for 1 year and is wanting to get all meds prior to leaving. Pt states that she will still be able to have virtual visits with you. Pt did not say when she is leaving. Writer returned pt call having to LVM requesting date of move. Pt has an appointment scheduled on 08/13/22. Please review and advise.

## 2022-07-30 NOTE — Telephone Encounter (Signed)
I will discuss with her on next appointment.  If patient is moving out of state or out of the country I will not able to see her.

## 2022-07-31 NOTE — Telephone Encounter (Signed)
LM for pt to return my call to discuss

## 2022-07-31 NOTE — Telephone Encounter (Signed)
Thank you :)

## 2022-08-06 ENCOUNTER — Other Ambulatory Visit (HOSPITAL_COMMUNITY): Payer: Self-pay | Admitting: Psychiatry

## 2022-08-06 DIAGNOSIS — F319 Bipolar disorder, unspecified: Secondary | ICD-10-CM

## 2022-08-06 DIAGNOSIS — F4310 Post-traumatic stress disorder, unspecified: Secondary | ICD-10-CM

## 2022-08-13 ENCOUNTER — Telehealth (HOSPITAL_BASED_OUTPATIENT_CLINIC_OR_DEPARTMENT_OTHER): Payer: Medicare Other | Admitting: Psychiatry

## 2022-08-13 ENCOUNTER — Encounter (HOSPITAL_COMMUNITY): Payer: Self-pay | Admitting: Psychiatry

## 2022-08-13 VITALS — Wt 215.0 lb

## 2022-08-13 DIAGNOSIS — F4310 Post-traumatic stress disorder, unspecified: Secondary | ICD-10-CM | POA: Diagnosis not present

## 2022-08-13 DIAGNOSIS — F319 Bipolar disorder, unspecified: Secondary | ICD-10-CM | POA: Diagnosis not present

## 2022-08-13 MED ORDER — FLUOXETINE HCL 20 MG PO CAPS: 20.0000 mg | ORAL_CAPSULE | Freq: Every day | ORAL | 2 refills | Status: DC

## 2022-08-13 MED ORDER — QUETIAPINE FUMARATE 300 MG PO TABS: 300.0000 mg | ORAL_TABLET | Freq: Every day | ORAL | 2 refills | Status: DC

## 2022-08-13 MED ORDER — CLONAZEPAM 0.5 MG PO TABS: 0.5000 mg | ORAL_TABLET | Freq: Two times a day (BID) | ORAL | 2 refills | Status: DC | PRN

## 2022-08-13 MED ORDER — QUETIAPINE FUMARATE 25 MG PO TABS: 25.0000 mg | ORAL_TABLET | Freq: Two times a day (BID) | ORAL | 2 refills | Status: DC
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
## 2022-08-20 ENCOUNTER — Telehealth (INDEPENDENT_AMBULATORY_CARE_PROVIDER_SITE_OTHER): Payer: Medicare Other | Admitting: Family Medicine

## 2022-08-20 ENCOUNTER — Encounter: Payer: Self-pay | Admitting: Family Medicine

## 2022-08-20 DIAGNOSIS — R531 Weakness: Secondary | ICD-10-CM | POA: Diagnosis not present

## 2022-08-20 DIAGNOSIS — R197 Diarrhea, unspecified: Secondary | ICD-10-CM

## 2022-08-20 DIAGNOSIS — R109 Unspecified abdominal pain: Secondary | ICD-10-CM

## 2022-08-20 DIAGNOSIS — R112 Nausea with vomiting, unspecified: Secondary | ICD-10-CM | POA: Diagnosis not present

## 2022-08-20 NOTE — Progress Notes (Signed)
MyChart Video Visit    Virtual Visit via Video Note    Patient location: Home. Patient and provider in visit Provider location: Office  I discussed the limitations of evaluation and management by telemedicine and the availability of in person appointments. The patient expressed understanding and agreed to proceed.  Visit Date: 08/20/2022  Today's healthcare provider: Hetty Blend, NP-C     Subjective:    Patient ID: Shannon [name], female    DOB: 1965/10/08, 57 y.o.   MRN: [id number]  Chief Complaint  Patient presents with   Diarrhea    Diarrhea  Associated symptoms include abdominal pain, myalgias, vomiting and weight loss. Pertinent negatives include no chills or fever.    C/o a 3 wk hx of diarrhea and abdominal pain. States she is crying due to pain. Diarrhea is green, watery and slimy  Diarrhea is daily and more than 10 episodes yesterday.   States she cannot eat much. Even crackers and ginger ale   States she has not been taking Linzess for several months.   Vomiting yesterday. Could not keep water down. Feels dehydrated.   No fever, chills, chest pain, shortness of breath.   She does not have a BP machine or a way to check her pulse.   States she does not have anyone to bring her to my office or to the hospital.   She is traveling to [place] on August 4th   Past Medical History:  Diagnosis Date   Allergic rhinitis    Allergic rhinitis 01/25/2009        ANEMIA-IRON DEFICIENCY 01/25/2009   Anxiety    Backache 01/25/2009   Qualifier: Diagnosis of  By: Jonny Ruiz MD, Len Blalock    Bipolar 1 disorder [facility])    Chronic pain syndrome    Complete rotator cuff tear 07/21/2013   Complete tear of right rotator cuff 07/21/2013   Contact lens/glasses fitting    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   DISC DISEASE, LUMBAR 01/25/2009   Essential hypertension 01/25/2009   On Bystolic    GAD (generalized anxiety disorder) 12/28/2013   GERD (gastroesophageal  reflux disease)    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   Headache, acute 12/20/2013   Hx of laparoscopic gastric banding 09/03/2010   Surgery date: 07/17/10    HYPERLIPIDEMIA 01/25/2009   Hyperlipidemia with target LDL less than 130 01/25/2009   HYPERTENSION 01/25/2009   Hypokalemia, inadequate intake 09/01/2011   Insomnia 04/04/2011   INSOMNIA-SLEEP DISORDER-UNSPEC 04/30/2009   Qualifier: Diagnosis of  By: Jonny Ruiz MD, Len Blalock    Iron deficiency anemia 01/25/2009        Labyrinthitis 02/19/2014   Leiomyoma of uterus, unspecified 08/13/2008   Overview:  Leiomyoma Of The Uterus  10/1 IMO update   Localized edema 06/12/2015   MANIC DEPRESSIVE ILLNESS 01/25/2009   pt is unsue if this is her specifc dx   Mixed bipolar I disorder (HCC) 07/12/2012   Morbid obesity (HCC) 01/25/2009   Night sweats    Osteopenia determined by x-ray 06/13/2021   Other abnormal glucose 10/26/2012   Palpitations 10/25/2013   PEPTIC ULCER DISEASE, HELICOBACTER PYLORI POSITIVE 10/03/2009   Peripheral edema 06/25/2015   Pernicious anemia 03/28/2012   Piriformis syndrome of both sides 09/02/2018   PUD (peptic ulcer disease) 10/03/2009        Right shoulder pain 05/03/2013   Dg Shoulder Right  05/03/2013   CLINICAL DATA Pain.  EXAM RIGHT SHOULDER - 2+ VIEW  COMPARISON None.  FINDINGS Acromioclavicular  and glenohumeral degenerative change present. Questionable calcific density noted in the region of the supraspinatus space, possibly representing calcific supraspinatus tendinitis. This could represent a sclerotic density in the acromion. MRI of the right shoulder suggest   Sleep apnea 08/12/2016   SMOKER 01/25/2009   Qualifier: Diagnosis of  By: Jonny Ruiz MD, Len Blalock    Subacromial bursitis 05/17/2013   SUBSTANCE ABUSE 11/06/2009        Thiamin deficiency 10/30/2012   Tobacco abuse 06/09/2010   Visual disturbance 05/03/2013   Vitamin D deficiency 10/27/2012    Past Surgical History:  Procedure Laterality Date   ABDOMINAL HYSTERECTOMY     BLADDER SURGERY      s/p with ?diverticulitis   HAND TENDON SURGERY  1991   s/p-Right-index and middle   LAPAROSCOPIC GASTRIC BAND REMOVAL WITH LAPAROSCOPIC GASTRIC SLEEVE RESECTION N/A 08/17/2016   Procedure: LAPAROSCOPIC GASTRIC BAND REMOVAL WITH LAPAROSCOPIC GASTRIC SLEEVE RESECTION WITH UPPER ENDO;  Surgeon: Ovidio Kin, MD;  Location: [facility];  Service: General;  Laterality: N/A;   LAPAROSCOPIC GASTRIC BANDING  07/14/10   SHOULDER ARTHROSCOPY Right 07/21/2013   Procedure: RIGHT ARTHROSCOPY SHOULDER DEBRIDMENT EXTENTSIVE,ARTHROSCOPIC REMOVE LOOSE FOREIGN BODY, BICEPS TENOLYSIS ;  Surgeon: Eulas Post, MD;  Location: [facility];  Service: Orthopedics;  Laterality: Right;   TUBAL LIGATION      Family History  Problem Relation Age of Onset   Hypertension Mother    Stroke Father    Cirrhosis Father        ETOH   Hypertension Father    Diabetes Father    Alcohol abuse Father    Hypertension Sister    Asthma Sister    Hypertension Brother    Hypertension Paternal Aunt    Diabetes Maternal Grandmother    ADD / ADHD Son    ADD / ADHD Other        3 nephews, also emotional issues   Diabetes Other        Uncle   Diabetes Other        Aunt   Suicidality Neg Hx     Social History   Socioeconomic History   Marital status: Married    Spouse name: Not on file   Number of children: 2   Years of education: Not on file   Highest education level: Not on file  Occupational History   Occupation: STUDENT    Employer: UNEMPLOYED  Tobacco Use   Smoking status: Every Day    Packs/day: 0.25    Years: 20.00    Additional pack years: 0.00    Total pack years: 5.00    Types: Cigarettes   Smokeless tobacco: Never   Tobacco comments:    States has cut back to 5-7 a day and working on this.   Vaping Use   Vaping Use: Never used  Substance and Sexual Activity   Alcohol use: No    Alcohol/week: 0.0 standard drinks of alcohol   Drug use: No   Sexual activity: Not Currently  Other  Topics Concern   Not on file  Social History Narrative   Moved from [place], then [place] to [place] 2009   2 children-1 boy, 1 girl   Disabled-bipolar   Daily Caffeine Use-1 cup/day      Right Handed   Lives in 2nd floor condo   Social Determinants of Health   Financial Resource Strain: High Risk (01/23/2022)   Overall Financial Resource Strain (CARDIA)    Difficulty of Paying Living  Expenses: Very hard  Food Insecurity: Food Insecurity Present (04/09/2022)   Hunger Vital Sign    Worried About Running Out of Food in the Last Year: Sometimes true    Ran Out of Food in the Last Year: Sometimes true  Transportation Needs: Unmet Transportation Needs (04/09/2022)   PRAPARE - Transportation    Lack of Transportation (Medical): Yes    Lack of Transportation (Non-Medical): Yes  Physical Activity: Inactive (04/07/2022)   Exercise Vital Sign    Days of Exercise per Week: 0 days    Minutes of Exercise per Session: 0 min  Stress: Stress Concern Present (04/07/2022)   [employer] of Occupational Health - Occupational Stress Questionnaire    Feeling of Stress : Rather much  Social Connections: Moderately Isolated (10/07/2021)   Social Connection and Isolation Panel [NHANES]    Frequency of Communication with Friends and Family: Three times a week    Frequency of Social Gatherings with Friends and Family: Never    Attends Religious Services: Never    Database administrator or Organizations: No    Attends Banker Meetings: Never    Marital Status: Married  Catering manager Violence: Not At Risk (10/07/2021)   Humiliation, Afraid, Rape, and Kick questionnaire    Fear of Current or Ex-Partner: No    Emotionally Abused: No    Physically Abused: No    Sexually Abused: No    Outpatient Medications Prior to Visit  Medication Sig Dispense Refill   atorvastatin (LIPITOR) 20 MG tablet Take 1 tablet (20 mg total) by mouth daily. 90 tablet 1   clonazePAM (KLONOPIN) 0.5 MG tablet  Take 1 tablet (0.5 mg total) by mouth 2 (two) times daily as needed for anxiety. 60 tablet 2   cyclobenzaprine (FLEXERIL) 10 MG tablet Take 1 tablet (10 mg total) by mouth at bedtime as needed for muscle spasms. 15 tablet 0   FLUoxetine (PROZAC) 20 MG capsule Take 1 capsule (20 mg total) by mouth daily. 30 capsule 2   gabapentin (NEURONTIN) 300 MG capsule Take 1 capsule (300 mg total) by mouth 2 (two) times daily. 60 capsule 1   linaclotide (LINZESS) 145 MCG CAPS capsule Take 1 capsule (145 mcg total) by mouth daily before breakfast. 30 capsule 2   meloxicam (MOBIC) 15 MG tablet Take 1 tablet (15 mg total) by mouth daily as needed for pain. 14 tablet 0   Multiple Vitamins-Minerals (BARIATRIC MULTIVITAMINS/IRON) CAPS Take 1 capsule by mouth daily. 30 capsule 2   pantoprazole (PROTONIX) 40 MG tablet Take 1 tablet (40 mg total) by mouth 2 (two) times daily before a meal. 180 tablet 0   polyethylene glycol powder (GLYCOLAX/MIRALAX) 17 GM/SCOOP powder Take entire bottle in 32 ounces of sports drink as directed 119 g 0   potassium chloride SA (KLOR-CON M) 20 MEQ tablet Take 1 tablet (20 mEq total) by mouth daily. 5 tablet 0   QUEtiapine (SEROQUEL) 25 MG tablet Take 1 tablet (25 mg total) by mouth 2 (two) times daily. 60 tablet 2   QUEtiapine (SEROQUEL) 300 MG tablet Take 1 tablet (300 mg total) by mouth at bedtime. 30 tablet 2   tiZANidine (ZANAFLEX) 4 MG tablet Take 1 tablet (4 mg total) by mouth at bedtime. 14 tablet 0   amLODipine (NORVASC) 5 MG tablet Take 1 tablet (5 mg total) by mouth daily. (Patient not taking: Reported on 08/20/2022) 30 tablet 2   No facility-administered medications prior to visit.    Allergies  Allergen Reactions  Lamictal [Lamotrigine] Rash    Review of Systems  Constitutional:  Positive for malaise/fatigue and weight loss. Negative for chills and fever.  Respiratory:  Negative for shortness of breath.   Cardiovascular:  Negative for chest pain and palpitations.   Gastrointestinal:  Positive for abdominal pain, diarrhea, nausea and vomiting. Negative for constipation.  Genitourinary:  Negative for dysuria, frequency and urgency.  Musculoskeletal:  Positive for myalgias.  Neurological:  Positive for dizziness and weakness.       Objective:    Physical Exam Constitutional:      General: She is not in acute distress.    Appearance: She is ill-appearing.     Comments: Laying in bed  Pulmonary:     Effort: Pulmonary effort is normal.  Neurological:     Mental Status: She is alert and oriented to person, place, and time.     Comments: No facial asymmetry. Moving upper extremities normally      There were no vitals taken for this visit. Wt Readings from Last 3 Encounters:  04/07/22 223 lb (101.2 kg)  02/18/22 241 lb (109.3 kg)  02/12/22 241 lb 8 oz (109.5 kg)       Assessment & Plan:   Problem List Items Addressed This Visit   None Visit Diagnoses     Diarrhea, unspecified type    -  Primary   Nausea and vomiting, unspecified vomiting type       Abdominal pain, unspecified abdominal location       Weakness generalized          3 wk hx of diarrhea, now vomiting and severe intermittent abdominal pain.  States she does not have a ride to bring her to my office.  Advised her to call 911 to be taken to the ED for evaluation and treatment.  Most likely dehydration. Possible bowel obstruction vs colitis.  Advised that her condition can worsen and she could become septic, dehydrated, and that without treatment could be at risk for worsening health including death. She is not sure if she will call 911 or not but states she is aware of what I am advising.   I am having Shannon [name] maintain her amLODipine, tiZANidine, cyclobenzaprine, Bariatric Multivitamins/Iron, meloxicam, pantoprazole, linaclotide, potassium chloride SA, atorvastatin, polyethylene glycol powder, gabapentin, clonazePAM, FLUoxetine, QUEtiapine, and QUEtiapine.  No  orders of the defined types were placed in this encounter.   I discussed the assessment and treatment plan with the patient. The patient was provided an opportunity to ask questions and all were answered. The patient agreed with the plan and demonstrated an understanding of the instructions.   The patient was advised to call back or seek an in-person evaluation if the symptoms worsen or if the condition fails to improve as anticipated.  I provided 20 minutes of face-to-face time during this encounter.   Hetty Blend, NP-C [facility] [place] [phone] (phone) [phone] (fax)  [facility]

## 2022-08-28 ENCOUNTER — Telehealth: Payer: Medicare Other | Admitting: Nurse Practitioner

## 2022-08-28 ENCOUNTER — Inpatient Hospital Stay (HOSPITAL_COMMUNITY)
Admission: EM | Admit: 2022-08-28 | Discharge: 2022-09-02 | DRG: 372 | Discharge status: Against Medical Advice | Payer: Medicare Other | Attending: Internal Medicine | Admitting: Internal Medicine

## 2022-08-28 ENCOUNTER — Other Ambulatory Visit: Payer: Self-pay

## 2022-08-28 ENCOUNTER — Emergency Department (HOSPITAL_COMMUNITY): Payer: Medicare Other

## 2022-08-28 DIAGNOSIS — R29898 Other symptoms and signs involving the musculoskeletal system: Secondary | ICD-10-CM

## 2022-08-28 DIAGNOSIS — Z9884 Bariatric surgery status: Secondary | ICD-10-CM

## 2022-08-28 DIAGNOSIS — Z823 Family history of stroke: Secondary | ICD-10-CM | POA: Diagnosis not present

## 2022-08-28 DIAGNOSIS — E871 Hypo-osmolality and hyponatremia: Secondary | ICD-10-CM | POA: Diagnosis not present

## 2022-08-28 DIAGNOSIS — I10 Essential (primary) hypertension: Secondary | ICD-10-CM | POA: Diagnosis not present

## 2022-08-28 DIAGNOSIS — Z888 Allergy status to other drugs, medicaments and biological substances status: Secondary | ICD-10-CM

## 2022-08-28 DIAGNOSIS — G4700 Insomnia, unspecified: Secondary | ICD-10-CM | POA: Diagnosis present

## 2022-08-28 DIAGNOSIS — E876 Hypokalemia: Secondary | ICD-10-CM | POA: Diagnosis not present

## 2022-08-28 DIAGNOSIS — Z825 Family history of asthma and other chronic lower respiratory diseases: Secondary | ICD-10-CM

## 2022-08-28 DIAGNOSIS — R109 Unspecified abdominal pain: Secondary | ICD-10-CM | POA: Diagnosis not present

## 2022-08-28 DIAGNOSIS — A0472 Enterocolitis due to Clostridium difficile, not specified as recurrent: Principal | ICD-10-CM | POA: Diagnosis present

## 2022-08-28 DIAGNOSIS — E538 Deficiency of other specified B group vitamins: Secondary | ICD-10-CM | POA: Diagnosis not present

## 2022-08-28 DIAGNOSIS — F3160 Bipolar disorder, current episode mixed, unspecified: Secondary | ICD-10-CM | POA: Diagnosis present

## 2022-08-28 DIAGNOSIS — M6281 Muscle weakness (generalized): Secondary | ICD-10-CM | POA: Diagnosis not present

## 2022-08-28 DIAGNOSIS — Z743 Need for continuous supervision: Secondary | ICD-10-CM | POA: Diagnosis not present

## 2022-08-28 DIAGNOSIS — M545 Low back pain, unspecified: Secondary | ICD-10-CM | POA: Diagnosis not present

## 2022-08-28 DIAGNOSIS — D509 Iron deficiency anemia, unspecified: Secondary | ICD-10-CM | POA: Diagnosis not present

## 2022-08-28 DIAGNOSIS — R531 Weakness: Secondary | ICD-10-CM | POA: Diagnosis not present

## 2022-08-28 DIAGNOSIS — G894 Chronic pain syndrome: Secondary | ICD-10-CM | POA: Diagnosis present

## 2022-08-28 DIAGNOSIS — R197 Diarrhea, unspecified: Secondary | ICD-10-CM | POA: Diagnosis present

## 2022-08-28 DIAGNOSIS — K649 Unspecified hemorrhoids: Secondary | ICD-10-CM | POA: Diagnosis not present

## 2022-08-28 DIAGNOSIS — Z8249 Family history of ischemic heart disease and other diseases of the circulatory system: Secondary | ICD-10-CM

## 2022-08-28 DIAGNOSIS — F411 Generalized anxiety disorder: Secondary | ICD-10-CM | POA: Diagnosis present

## 2022-08-28 DIAGNOSIS — K219 Gastro-esophageal reflux disease without esophagitis: Secondary | ICD-10-CM | POA: Diagnosis present

## 2022-08-28 DIAGNOSIS — F17210 Nicotine dependence, cigarettes, uncomplicated: Secondary | ICD-10-CM | POA: Diagnosis present

## 2022-08-28 DIAGNOSIS — K760 Fatty (change of) liver, not elsewhere classified: Secondary | ICD-10-CM | POA: Diagnosis present

## 2022-08-28 DIAGNOSIS — E785 Hyperlipidemia, unspecified: Secondary | ICD-10-CM | POA: Diagnosis not present

## 2022-08-28 DIAGNOSIS — R251 Tremor, unspecified: Secondary | ICD-10-CM | POA: Diagnosis not present

## 2022-08-28 DIAGNOSIS — W1811XA Fall from or off toilet without subsequent striking against object, initial encounter: Secondary | ICD-10-CM | POA: Diagnosis present

## 2022-08-28 DIAGNOSIS — Z6841 Body Mass Index (BMI) 40.0 and over, adult: Secondary | ICD-10-CM

## 2022-08-28 DIAGNOSIS — M5136 Other intervertebral disc degeneration, lumbar region: Secondary | ICD-10-CM | POA: Diagnosis not present

## 2022-08-28 DIAGNOSIS — R270 Ataxia, unspecified: Secondary | ICD-10-CM | POA: Diagnosis not present

## 2022-08-28 DIAGNOSIS — Z833 Family history of diabetes mellitus: Secondary | ICD-10-CM

## 2022-08-28 DIAGNOSIS — R202 Paresthesia of skin: Secondary | ICD-10-CM | POA: Diagnosis not present

## 2022-08-28 DIAGNOSIS — D638 Anemia in other chronic diseases classified elsewhere: Secondary | ICD-10-CM | POA: Diagnosis not present

## 2022-08-28 DIAGNOSIS — Y92009 Unspecified place in unspecified non-institutional (private) residence as the place of occurrence of the external cause: Secondary | ICD-10-CM

## 2022-08-28 DIAGNOSIS — Z79899 Other long term (current) drug therapy: Secondary | ICD-10-CM

## 2022-08-28 DIAGNOSIS — M5137 Other intervertebral disc degeneration, lumbosacral region: Secondary | ICD-10-CM | POA: Diagnosis not present

## 2022-08-28 DIAGNOSIS — W19XXXA Unspecified fall, initial encounter: Secondary | ICD-10-CM | POA: Diagnosis not present

## 2022-08-28 DIAGNOSIS — K449 Diaphragmatic hernia without obstruction or gangrene: Secondary | ICD-10-CM | POA: Diagnosis not present

## 2022-08-28 DIAGNOSIS — Z5321 Procedure and treatment not carried out due to patient leaving prior to being seen by health care provider: Secondary | ICD-10-CM | POA: Diagnosis present

## 2022-08-28 LAB — URINALYSIS, W/ REFLEX TO CULTURE (INFECTION SUSPECTED)
Bilirubin Urine: NEGATIVE
Glucose, UA: NEGATIVE mg/dL
Hgb urine dipstick: NEGATIVE
Ketones, ur: NEGATIVE mg/dL
Leukocytes,Ua: NEGATIVE
Nitrite: NEGATIVE
Protein, ur: NEGATIVE mg/dL
Specific Gravity, Urine: >1.046 — ABNORMAL HIGH (ref 1.005–1.030)
pH: 7 (ref 5.0–8.0)

## 2022-08-28 LAB — CBC WITH DIFFERENTIAL/PLATELET
Abs Immature Granulocytes: 0.03 10*3/uL (ref 0.00–0.07)
Basophils Absolute: 0 10*3/uL (ref 0.0–0.1)
Basophils Relative: 0 %
Eosinophils Absolute: 0.1 10*3/uL (ref 0.0–0.5)
Eosinophils Relative: 1 %
HCT: 33.1 % — ABNORMAL LOW (ref 36.0–46.0)
Hemoglobin: 11.1 g/dL — ABNORMAL LOW (ref 12.0–15.0)
Immature Granulocytes: 0 %
Lymphocytes Relative: 27 %
Lymphs Abs: 1.8 10*3/uL (ref 0.7–4.0)
MCH: 28.8 pg (ref 26.0–34.0)
MCHC: 33.5 g/dL (ref 30.0–36.0)
MCV: 86 fL (ref 80.0–100.0)
Monocytes Absolute: 0.5 10*3/uL (ref 0.1–1.0)
Monocytes Relative: 7 %
Neutro Abs: 4.4 10*3/uL (ref 1.7–7.7)
Neutrophils Relative %: 65 %
Platelets: 308 10*3/uL (ref 150–400)
RBC: 3.85 MIL/uL — ABNORMAL LOW (ref 3.87–5.11)
RDW: 17.2 % — ABNORMAL HIGH (ref 11.5–15.5)
WBC: 6.9 10*3/uL (ref 4.0–10.5)
nRBC: 0 % (ref 0.0–0.2)

## 2022-08-28 LAB — BLOOD GAS, ARTERIAL
Acid-Base Excess: 3.3 mmol/L — ABNORMAL HIGH (ref 0.0–2.0)
Bicarbonate: 29.1 mmol/L — ABNORMAL HIGH (ref 20.0–28.0)
Drawn by: 56037
O2 Saturation: 97.3 %
Patient temperature: 36.8
pCO2 arterial: 48 mmHg (ref 32–48)
pH, Arterial: 7.39 (ref 7.35–7.45)
pO2, Arterial: 71 mmHg — ABNORMAL LOW (ref 83–108)

## 2022-08-28 LAB — COMPREHENSIVE METABOLIC PANEL
ALT: 17 U/L (ref 0–44)
AST: 21 U/L (ref 15–41)
Albumin: 2.2 g/dL — ABNORMAL LOW (ref 3.5–5.0)
Alkaline Phosphatase: 131 U/L — ABNORMAL HIGH (ref 38–126)
Anion gap: 9 (ref 5–15)
BUN: <5 mg/dL — ABNORMAL LOW (ref 6–20)
CO2: 31 mmol/L (ref 22–32)
Calcium: 6.3 mg/dL — CL (ref 8.9–10.3)
Chloride: 97 mmol/L — ABNORMAL LOW (ref 98–111)
Creatinine, Ser: 0.89 mg/dL (ref 0.44–1.00)
GFR, Estimated: >60 mL/min (ref >60)
Glucose, Bld: 95 mg/dL (ref 70–99)
Potassium: 3.1 mmol/L — ABNORMAL LOW (ref 3.5–5.1)
Sodium: 137 mmol/L (ref 135–145)
Total Bilirubin: 0.8 mg/dL (ref 0.3–1.2)
Total Protein: 6.5 g/dL (ref 6.5–8.1)

## 2022-08-28 LAB — PHOSPHORUS: Phosphorus: 3.2 mg/dL (ref 2.5–4.6)

## 2022-08-28 LAB — LIPASE, BLOOD: Lipase: 19 U/L (ref 11–51)

## 2022-08-28 LAB — FOLATE: Folate: 4.6 ng/mL — ABNORMAL LOW (ref >5.9)

## 2022-08-28 LAB — MAGNESIUM: Magnesium: 1.4 mg/dL — ABNORMAL LOW (ref 1.7–2.4)

## 2022-08-28 LAB — VITAMIN D 25 HYDROXY (VIT D DEFICIENCY, FRACTURES): Vit D, 25-Hydroxy: 62.84 ng/mL (ref 30–100)

## 2022-08-28 LAB — VITAMIN B12: Vitamin B-12: 279 pg/mL (ref 180–914)

## 2022-08-28 MED ORDER — CALCIUM GLUCONATE-NACL 1-0.675 GM/50ML-% IV SOLN
1.0000 g | Freq: Once | INTRAVENOUS | Status: AC
  Administered 2022-08-28: 1000 mg via INTRAVENOUS
  Filled 2022-08-28: qty 50

## 2022-08-28 MED ORDER — ACETAMINOPHEN 325 MG PO TABS
650.0000 mg | ORAL_TABLET | Freq: Four times a day (QID) | ORAL | Status: DC | PRN
  Administered 2022-08-31: 650 mg via ORAL
  Filled 2022-08-28: qty 2

## 2022-08-28 MED ORDER — FOLIC ACID 1 MG PO TABS
1.0000 mg | ORAL_TABLET | Freq: Every day | ORAL | Status: DC
  Administered 2022-08-28 – 2022-09-02 (×6): 1 mg via ORAL
  Filled 2022-08-28 (×6): qty 1

## 2022-08-28 MED ORDER — GABAPENTIN 300 MG PO CAPS
300.0000 mg | ORAL_CAPSULE | Freq: Two times a day (BID) | ORAL | Status: DC
  Administered 2022-08-28 – 2022-09-02 (×10): 300 mg via ORAL
  Filled 2022-08-28 (×11): qty 1

## 2022-08-28 MED ORDER — ATORVASTATIN CALCIUM 10 MG PO TABS
20.0000 mg | ORAL_TABLET | Freq: Every day | ORAL | Status: DC
  Administered 2022-08-29 – 2022-09-02 (×5): 20 mg via ORAL
  Filled 2022-08-28 (×5): qty 2

## 2022-08-28 MED ORDER — ACETAMINOPHEN 650 MG RE SUPP: 650.0000 mg | Freq: Four times a day (QID) | RECTAL | Status: DC | PRN

## 2022-08-28 MED ORDER — CLONAZEPAM 0.5 MG PO TABS
0.5000 mg | ORAL_TABLET | Freq: Two times a day (BID) | ORAL | Status: DC | PRN
  Administered 2022-08-28 – 2022-09-01 (×4): 0.5 mg via ORAL
  Filled 2022-08-28 (×5): qty 1

## 2022-08-28 MED ORDER — ENOXAPARIN SODIUM 40 MG/0.4ML IJ SOSY
40.0000 mg | PREFILLED_SYRINGE | INTRAMUSCULAR | Status: DC
  Administered 2022-08-29 – 2022-09-02 (×5): 40 mg via SUBCUTANEOUS
  Filled 2022-08-28 (×5): qty 0.4

## 2022-08-28 MED ORDER — SODIUM CHLORIDE 0.9% FLUSH
3.0000 mL | Freq: Two times a day (BID) | INTRAVENOUS | Status: DC
  Administered 2022-08-28 – 2022-09-02 (×7): 3 mL via INTRAVENOUS

## 2022-08-28 MED ORDER — SODIUM CHLORIDE 0.9 % IV BOLUS
1000.0000 mL | Freq: Once | INTRAVENOUS | Status: AC
  Administered 2022-08-28: 1000 mL via INTRAVENOUS

## 2022-08-28 MED ORDER — MAGNESIUM SULFATE 2 GM/50ML IV SOLN: 2.0000 g | Freq: Once | INTRAVENOUS | Status: DC

## 2022-08-28 MED ORDER — MAGNESIUM SULFATE 2 GM/50ML IV SOLN
2.0000 g | Freq: Once | INTRAVENOUS | Status: AC
  Administered 2022-08-28: 2 g via INTRAVENOUS
  Filled 2022-08-28: qty 50

## 2022-08-28 MED ORDER — SODIUM CHLORIDE (PF) 0.9 % IJ SOLN
INTRAMUSCULAR | Status: AC
  Filled 2022-08-28: qty 50

## 2022-08-28 MED ORDER — SODIUM CHLORIDE 0.9 % IV SOLN: INTRAVENOUS | Status: DC

## 2022-08-28 MED ORDER — AMLODIPINE BESYLATE 5 MG PO TABS
5.0000 mg | ORAL_TABLET | Freq: Every day | ORAL | Status: DC
  Administered 2022-08-28 – 2022-09-01 (×5): 5 mg via ORAL
  Filled 2022-08-28 (×6): qty 1

## 2022-08-28 MED ORDER — QUETIAPINE FUMARATE 25 MG PO TABS
25.0000 mg | ORAL_TABLET | Freq: Every day | ORAL | Status: DC
  Administered 2022-08-29 – 2022-09-02 (×5): 25 mg via ORAL
  Filled 2022-08-28 (×5): qty 1

## 2022-08-28 MED ORDER — SODIUM CHLORIDE 0.9 % IV BOLUS: 500.0000 mL | Freq: Once | INTRAVENOUS | Status: DC

## 2022-08-28 MED ORDER — MAGNESIUM OXIDE -MG SUPPLEMENT 400 (240 MG) MG PO TABS
400.0000 mg | ORAL_TABLET | Freq: Two times a day (BID) | ORAL | Status: DC
  Administered 2022-08-29 – 2022-09-02 (×9): 400 mg via ORAL
  Filled 2022-08-28 (×10): qty 1

## 2022-08-28 MED ORDER — QUETIAPINE FUMARATE 100 MG PO TABS
300.0000 mg | ORAL_TABLET | Freq: Every day | ORAL | Status: DC
  Administered 2022-08-28 – 2022-09-01 (×5): 300 mg via ORAL
  Filled 2022-08-28 (×2): qty 3
  Filled 2022-08-28: qty 1
  Filled 2022-08-28 (×4): qty 3

## 2022-08-28 MED ORDER — CYANOCOBALAMIN 1000 MCG/ML IJ SOLN
1000.0000 ug | Freq: Once | INTRAMUSCULAR | Status: AC
  Administered 2022-08-28: 1000 ug via INTRAMUSCULAR
  Filled 2022-08-28: qty 1

## 2022-08-28 MED ORDER — POLYETHYLENE GLYCOL 3350 17 G PO PACK: 17.0000 g | PACK | Freq: Every day | ORAL | Status: DC | PRN

## 2022-08-28 MED ORDER — POTASSIUM CHLORIDE CRYS ER 20 MEQ PO TBCR
40.0000 meq | EXTENDED_RELEASE_TABLET | Freq: Once | ORAL | Status: AC
  Administered 2022-08-28: 40 meq via ORAL
  Filled 2022-08-28: qty 2

## 2022-08-28 MED ORDER — PANTOPRAZOLE SODIUM 40 MG PO TBEC
40.0000 mg | DELAYED_RELEASE_TABLET | Freq: Two times a day (BID) | ORAL | Status: DC
  Administered 2022-08-28 – 2022-09-02 (×11): 40 mg via ORAL
  Filled 2022-08-28 (×11): qty 1

## 2022-08-28 MED ORDER — CALCIUM CARBONATE ANTACID 500 MG PO CHEW
1.0000 | CHEWABLE_TABLET | Freq: Once | ORAL | Status: AC
  Administered 2022-08-28: 200 mg via ORAL
  Filled 2022-08-28: qty 1

## 2022-08-28 MED ORDER — VITAMIN B-12 1000 MCG PO TABS
2000.0000 ug | ORAL_TABLET | Freq: Every day | ORAL | Status: DC
  Administered 2022-08-29 – 2022-09-02 (×5): 2000 ug via ORAL
  Filled 2022-08-28 (×5): qty 2

## 2022-08-28 MED ORDER — POTASSIUM CHLORIDE 20 MEQ PO PACK
40.0000 meq | PACK | Freq: Once | ORAL | Status: AC
  Administered 2022-08-28: 40 meq via ORAL
  Filled 2022-08-28: qty 2

## 2022-08-28 MED ORDER — MAGNESIUM OXIDE -MG SUPPLEMENT 400 (240 MG) MG PO TABS
400.0000 mg | ORAL_TABLET | Freq: Once | ORAL | Status: AC
  Administered 2022-08-28: 400 mg via ORAL
  Filled 2022-08-28: qty 1

## 2022-08-28 MED ORDER — IOHEXOL 300 MG/ML  SOLN
100.0000 mL | Freq: Once | INTRAMUSCULAR | Status: AC | PRN
  Administered 2022-08-28: 100 mL via INTRAVENOUS

## 2022-08-28 NOTE — H&P (Signed)
History and Physical    Patient: Shannon [name] AVW:[id number] DOB: January 25, 1966 DOA: 08/28/2022 DOS: the patient was seen and examined on 08/28/2022 PCP: Avanell Shackleton, NP-C  Patient coming from: Home  Chief Complaint:  Chief Complaint  Patient presents with   Fall   Weakness   HPI: Shannon [name] is a 57 y.o. female with medical history significant of obesity and other medical disorders as listed below.  Patient reports that she has had several episodes of falling at least 12 at home which are preceded by lower extremity weakness, and some tremors.  Subsequently after falling, patient is unable to get back home and has had to have EMS come help her out.  Patient reports that 4 weeks ago she was in her usual state of health when she had a new onset of diarrhea that lasted till about 5 days ago.  At the time patient took some Imodium and patient has had no more diarrhea since then no vomiting no fever no abdominal pain.  Last evening at approximately 3 AM patient was getting up to use the bathroom when she had another episode of weakness/slight trembling of bilateral lower extremities and falling on the floor.  Patient was unable to get back up.  Had to crawl to the phone, talk to her PCP, subsequently talked to her EMS provider and come to the ER.  Patient reports that the EMS found the patient on the floor.  Patient was helped by the EMS and brought to [facility] ER.  ER course has been 1 of vital stability, however patient was attempted to be ambulated and could not, requiring wheelchair.  Medical evaluation is sought Review of Systems: As mentioned in the history of present illness. All other systems reviewed and are negative. Past Medical History:  Diagnosis Date   Allergic rhinitis    Allergic rhinitis 01/25/2009        ANEMIA-IRON DEFICIENCY 01/25/2009   Anxiety    Backache 01/25/2009   Qualifier: Diagnosis of  By: Jonny Ruiz MD, Len Blalock    Bipolar 1 disorder [facility])    Chronic pain syndrome     Complete rotator cuff tear 07/21/2013   Complete tear of right rotator cuff 07/21/2013   Contact lens/glasses fitting    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   DISC DISEASE, LUMBAR 01/25/2009   Essential hypertension 01/25/2009   On Bystolic    GAD (generalized anxiety disorder) 12/28/2013   GERD (gastroesophageal reflux disease)    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   Headache, acute 12/20/2013   Hx of laparoscopic gastric banding 09/03/2010   Surgery date: 07/17/10    HYPERLIPIDEMIA 01/25/2009   Hyperlipidemia with target LDL less than 130 01/25/2009   HYPERTENSION 01/25/2009   Hypokalemia, inadequate intake 09/01/2011   Insomnia 04/04/2011   INSOMNIA-SLEEP DISORDER-UNSPEC 04/30/2009   Qualifier: Diagnosis of  By: Jonny Ruiz MD, Len Blalock    Iron deficiency anemia 01/25/2009        Labyrinthitis 02/19/2014   Leiomyoma of uterus, unspecified 08/13/2008   Overview:  Leiomyoma Of The Uterus  10/1 IMO update   Localized edema 06/12/2015   MANIC DEPRESSIVE ILLNESS 01/25/2009   pt is unsue if this is her specifc dx   Mixed bipolar I disorder (HCC) 07/12/2012   Morbid obesity (HCC) 01/25/2009   Night sweats    Osteopenia determined by x-ray 06/13/2021   Other abnormal glucose 10/26/2012   Palpitations 10/25/2013   PEPTIC ULCER DISEASE, HELICOBACTER PYLORI POSITIVE 10/03/2009   Peripheral edema  06/25/2015   Pernicious anemia 03/28/2012   Piriformis syndrome of both sides 09/02/2018   PUD (peptic ulcer disease) 10/03/2009        Right shoulder pain 05/03/2013   Dg Shoulder Right  05/03/2013   CLINICAL DATA Pain.  EXAM RIGHT SHOULDER - 2+ VIEW  COMPARISON None.  FINDINGS Acromioclavicular and glenohumeral degenerative change present. Questionable calcific density noted in the region of the supraspinatus space, possibly representing calcific supraspinatus tendinitis. This could represent a sclerotic density in the acromion. MRI of the right shoulder suggest   Sleep apnea 08/12/2016   SMOKER 01/25/2009   Qualifier:  Diagnosis of  By: Jonny Ruiz MD, Len Blalock    Subacromial bursitis 05/17/2013   SUBSTANCE ABUSE 11/06/2009        Thiamin deficiency 10/30/2012   Tobacco abuse 06/09/2010   Visual disturbance 05/03/2013   Vitamin D deficiency 10/27/2012   Past Surgical History:  Procedure Laterality Date   ABDOMINAL HYSTERECTOMY     BLADDER SURGERY     s/p with ?diverticulitis   HAND TENDON SURGERY  1991   s/p-Right-index and middle   LAPAROSCOPIC GASTRIC BAND REMOVAL WITH LAPAROSCOPIC GASTRIC SLEEVE RESECTION N/A 08/17/2016   Procedure: LAPAROSCOPIC GASTRIC BAND REMOVAL WITH LAPAROSCOPIC GASTRIC SLEEVE RESECTION WITH UPPER ENDO;  Surgeon: Ovidio Kin, MD;  Location: [facility];  Service: General;  Laterality: N/A;   LAPAROSCOPIC GASTRIC BANDING  07/14/10   SHOULDER ARTHROSCOPY Right 07/21/2013   Procedure: RIGHT ARTHROSCOPY SHOULDER DEBRIDMENT EXTENTSIVE,ARTHROSCOPIC REMOVE LOOSE FOREIGN BODY, BICEPS TENOLYSIS ;  Surgeon: Eulas Post, MD;  Location: [facility];  Service: Orthopedics;  Laterality: Right;   TUBAL LIGATION     Social History:  reports that she has been smoking cigarettes. She has a 5.00 pack-year smoking history. She has never used smokeless tobacco. She reports that she does not drink alcohol and does not use drugs.  Allergies  Allergen Reactions   Lamictal [Lamotrigine] Rash    Family History  Problem Relation Age of Onset   Hypertension Mother    Stroke Father    Cirrhosis Father        ETOH   Hypertension Father    Diabetes Father    Alcohol abuse Father    Hypertension Sister    Asthma Sister    Hypertension Brother    Hypertension Paternal Aunt    Diabetes Maternal Grandmother    ADD / ADHD Son    ADD / ADHD Other        3 nephews, also emotional issues   Diabetes Other        Uncle   Diabetes Other        Aunt   Suicidality Neg Hx     Prior to Admission medications   Medication Sig Start Date End Date Taking? Authorizing Provider  amLODipine (NORVASC) 5 MG  tablet Take 1 tablet (5 mg total) by mouth daily. 05/14/21   Henson, [name], NP-C  atorvastatin (LIPITOR) 20 MG tablet Take 1 tablet (20 mg total) by mouth daily. 02/22/22   Henson, [name], NP-C  clonazePAM (KLONOPIN) 0.5 MG tablet Take 1 tablet (0.5 mg total) by mouth 2 (two) times daily as needed for anxiety. 08/13/22   Arfeen, Phillips Grout, MD  FLUoxetine (PROZAC) 20 MG capsule Take 1 capsule (20 mg total) by mouth daily. 08/13/22   Arfeen, Phillips Grout, MD  gabapentin (NEURONTIN) 300 MG capsule Take 1 capsule (300 mg total) by mouth 2 (two) times daily. 06/18/22   Nita Sickle  K, DO  linaclotide (LINZESS) 145 MCG CAPS capsule Take 1 capsule (145 mcg total) by mouth daily before breakfast. Patient not taking: Reported on 08/28/2022 02/12/22   Imogene Burn, MD  meloxicam (MOBIC) 15 MG tablet Take 1 tablet (15 mg total) by mouth daily as needed for pain. Patient not taking: Reported on 08/28/2022 01/26/22   Richardean Sale, DO  Multiple Vitamins-Minerals (BARIATRIC MULTIVITAMINS/IRON) CAPS Take 1 capsule by mouth daily. 01/14/22   Henson, [name], NP-C  pantoprazole (PROTONIX) 40 MG tablet Take 1 tablet (40 mg total) by mouth 2 (two) times daily before a meal. 01/27/22   Henson, [name], NP-C  potassium chloride SA (KLOR-CON M) 20 MEQ tablet Take 1 tablet (20 mEq total) by mouth daily. Patient not taking: Reported on 08/28/2022 02/20/22   Hetty Blend L, NP-C  QUEtiapine (SEROQUEL) 25 MG tablet Take 1 tablet (25 mg total) by mouth 2 (two) times daily. 08/13/22 11/11/22  Arfeen, Phillips Grout, MD  QUEtiapine (SEROQUEL) 300 MG tablet Take 1 tablet (300 mg total) by mouth at bedtime. 08/13/22 11/11/22  Cleotis Nipper, MD    Physical Exam: Vitals:   08/28/22 1500 08/28/22 1630 08/28/22 1709 08/28/22 2008  BP:  (!) 125/97  (!) 153/98  Pulse: (!) 101 100  99  Resp: (!) 23 18  18   Temp:   98.5 F (36.9 C)   TempSrc:   Oral   SpO2: (!) 88% 90%  99%  Weight:      Height:       General: Appears to be in no distress,  obviously obese, alert awake gives a coherent account for symptoms. Respiratory exam: Bilateral intravesicular Cardiovascular exam S1-S2 normal Abdomen all quadrants soft nontender Extremities warm without edema Neurologic exam: Bilateral upper extremity strength is 5/5 there is antigravity strength demonstrated in bilateral lower extremities, effort is in-constant and lower extremities, but no obvious focal deficit is noted. Data Reviewed:  Labs on Admission:  Results for orders placed or performed during the hospital encounter of 08/28/22 (from the past 24 hour(s))  CBC with Differential     Status: Abnormal   Collection Time: 08/28/22  1:50 PM  Result Value Ref Range   WBC 6.9 4.0 - 10.5 K/uL   RBC 3.85 (L) 3.87 - 5.11 MIL/uL   Hemoglobin 11.1 (L) 12.0 - 15.0 g/dL   HCT 95.2 (L) 84.1 - 32.4 %   MCV 86.0 80.0 - 100.0 fL   MCH 28.8 26.0 - 34.0 pg   MCHC 33.5 30.0 - 36.0 g/dL   RDW 40.1 (H) 02.7 - 25.3 %   Platelets 308 150 - 400 K/uL   nRBC 0.0 0.0 - 0.2 %   Neutrophils Relative % 65 %   Neutro Abs 4.4 1.7 - 7.7 K/uL   Lymphocytes Relative 27 %   Lymphs Abs 1.8 0.7 - 4.0 K/uL   Monocytes Relative 7 %   Monocytes Absolute 0.5 0.1 - 1.0 K/uL   Eosinophils Relative 1 %   Eosinophils Absolute 0.1 0.0 - 0.5 K/uL   Basophils Relative 0 %   Basophils Absolute 0.0 0.0 - 0.1 K/uL   Immature Granulocytes 0 %   Abs Immature Granulocytes 0.03 0.00 - 0.07 K/uL  Lipase, blood     Status: None   Collection Time: 08/28/22  1:50 PM  Result Value Ref Range   Lipase 19 11 - 51 U/L  Phosphorus     Status: None   Collection Time: 08/28/22  1:50 PM  Result  Value Ref Range   Phosphorus 3.2 2.5 - 4.6 mg/dL  Comprehensive metabolic panel     Status: Abnormal   Collection Time: 08/28/22  1:58 PM  Result Value Ref Range   Sodium 137 135 - 145 mmol/L   Potassium 3.1 (L) 3.5 - 5.1 mmol/L   Chloride 97 (L) 98 - 111 mmol/L   CO2 31 22 - 32 mmol/L   Glucose, Bld 95 70 - 99 mg/dL   BUN <5 (L) 6 -  20 mg/dL   Creatinine, Ser 1.47 0.44 - 1.00 mg/dL   Calcium 6.3 (LL) 8.9 - 10.3 mg/dL   Total Protein 6.5 6.5 - 8.1 g/dL   Albumin 2.2 (L) 3.5 - 5.0 g/dL   AST 21 15 - 41 U/L   ALT 17 0 - 44 U/L   Alkaline Phosphatase 131 (H) 38 - 126 U/L   Total Bilirubin 0.8 0.3 - 1.2 mg/dL   GFR, Estimated >82 >95 mL/min   Anion gap 9 5 - 15  Magnesium     Status: Abnormal   Collection Time: 08/28/22  1:58 PM  Result Value Ref Range   Magnesium 1.4 (L) 1.7 - 2.4 mg/dL  Vitamin A21     Status: None   Collection Time: 08/28/22  5:45 PM  Result Value Ref Range   Vitamin B-12 279 180 - 914 pg/mL  Folate     Status: Abnormal   Collection Time: 08/28/22  5:45 PM  Result Value Ref Range   Folate 4.6 (L) >5.9 ng/mL  corrected Calcium was calculated at 7.74 mg/dL Basic Metabolic Panel: Recent Labs  Lab 08/28/22 1350 08/28/22 1358  NA  --  137  K  --  3.1*  CL  --  97*  CO2  --  31  GLUCOSE  --  95  BUN  --  <5*  CREATININE  --  0.89  CALCIUM  --  6.3*  MG  --  1.4*  PHOS 3.2  --    Liver Function Tests: Recent Labs  Lab 08/28/22 1358  AST 21  ALT 17  ALKPHOS 131*  BILITOT 0.8  PROT 6.5  ALBUMIN 2.2*   Recent Labs  Lab 08/28/22 1350  LIPASE 19   No results for input(s): "AMMONIA" in the last 168 hours. CBC: Recent Labs  Lab 08/28/22 1350  WBC 6.9  NEUTROABS 4.4  HGB 11.1*  HCT 33.1*  MCV 86.0  PLT 308   Cardiac Enzymes: No results for input(s): "CKTOTAL", "CKMB", "CKMBINDEX", "TROPONINIHS" in the last 168 hours.  BNP (last 3 results) No results for input(s): "PROBNP" in the last 8760 hours. CBG: No results for input(s): "GLUCAP" in the last 168 hours.  Radiological Exams on Admission:  MR LUMBAR SPINE WO CONTRAST  Result Date: 08/28/2022 CLINICAL DATA:  Low back pain EXAM: MRI LUMBAR SPINE WITHOUT CONTRAST TECHNIQUE: Multiplanar, multisequence MR imaging of the lumbar spine was performed. No intravenous contrast was administered. COMPARISON:  None Available.  FINDINGS: Segmentation:  Standard. Alignment:  Physiologic. Vertebrae:  No fracture, evidence of discitis, or bone lesion. Conus medullaris and cauda equina: Conus extends to the L1 level. Conus and cauda equina appear normal. Paraspinal and other soft tissues: Negative. Disc levels: L1-L2: Normal disc space and facet joints. No spinal canal stenosis. No neural foraminal stenosis. L2-L3: Normal disc space and facet joints. No spinal canal stenosis. No neural foraminal stenosis. L3-L4: Normal disc space and facet joints. No spinal canal stenosis. No neural foraminal stenosis. L4-L5: Small disc bulge  and mild facet hypertrophy. No spinal canal stenosis. No neural foraminal stenosis. L5-S1: Small disc bulge and mild facet hypertrophy. No spinal canal stenosis. No neural foraminal stenosis. Visualized sacrum: Normal. IMPRESSION: Mild lower lumbar degenerative disc disease without spinal canal or neural foraminal stenosis. Electronically Signed   By: Deatra Robinson M.D.   On: 08/28/2022 19:04   CT ABDOMEN PELVIS W CONTRAST  Result Date: 08/28/2022 CLINICAL DATA:  Abdominal pain. Fall last night with weakness and tingling in legs. EXAM: CT ABDOMEN AND PELVIS WITH CONTRAST TECHNIQUE: Multidetector CT imaging of the abdomen and pelvis was performed using the standard protocol following bolus administration of intravenous contrast. RADIATION DOSE REDUCTION: This exam was performed according to the departmental dose-optimization program which includes automated exposure control, adjustment of the mA and/or kV according to patient size and/or use of iterative reconstruction technique. CONTRAST:  OMNIPAQUE IOHEXOL 300 MG/ML  SOLN COMPARISON:  CT 12/25/2021. FINDINGS: Lower chest: There are several tiny sub 5 mm lung nodules identified the bases. These are essentially unchanged from previous examination such as left lower lobe on series 6, image 23, middle lobe image 19 and 9, right lower lobe on image 11 amongst others.  These stable. There is some basilar scar or atelectasis identified. No pleural effusion. Small hiatal hernia. There is slight wall thickening of the distal esophagus. Trace pericardial fluid. Hepatobiliary: Fatty liver infiltration. Patent portal vein. Gallbladder is present. Pancreas: Diffuse atrophy of the pancreas without mass lesion. Spleen: Normal in size without focal abnormality. Adrenals/Urinary Tract: Adrenal glands are preserved. Mild renal atrophy. No collecting system dilatation or enhancing renal mass. The ureters have normal course and caliber extending down to the bladder. Preserved contours of the urinary bladder. Stomach/Bowel: No oral contrast. Large bowel is nondilated. There are scattered areas of some low-density along the wall of the colon. Please correlate for any known history of chronic inflammatory state. Normal appendix in the right lower quadrant, hemipelvis extending medial to the cecum. 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 Vascular/Lymphatic: Aortic atherosclerosis. No enlarged abdominal or pelvic lymph nodes. Reproductive: Status post hysterectomy. No adnexal masses. Other: Mild anasarca.  No free air or free fluid. Musculoskeletal: Degenerative changes of the spine and pelvis particularly the right hip. IMPRESSION: No bowel obstruction, free air or free fluid. Previous surgical changes of gastric sleeve with a hiatal hernia and slight wall thickening of the distal esophagus. Normal appendix. Fatty liver infiltration. Stable multiple sub 5 mm lung nodules at the lung bases. Recommend follow-up in 1 year. Electronically Signed   By: Karen Kays M.D.   On: 08/28/2022 15:39    EKG: Independently reviewed. Slight QTC prolongation.   Assessment and Plan: * Fall at home, initial encounter Has a pattern of chronic falls at home.  Current fall is likely precipitated/exacerbated by patient having electrolyte abnormalities as above.  However patient is also noted to have a borderline  B12 and low folate.  I will therefore start supplementing both.  Check a CK as well.  Diarrhea  Started about 4 weeks ago, resolved about 5 days ago.  This is the likely reason for patient electrolyte abnormalities.  At this time monitoring clinically  Hypocalcemia Did with slight QTc prolongation.  Therefore we will treat with intravenous calcium gluconate 1 g once, check again at around midnight tonight and then again at 4 AM.  Associated with hypomagnesemia.  Treating with magnesium sulfate intravenous once.  And then continue with p.o. magnesium sulfate. Telemetry.      Advance  Care Planning:   Code Status: Prior full code.  Consults: none at this time.  Family Communication: per patient.   Severity of Illness: The appropriate patient status for this patient is INPATIENT. Inpatient status is judged to be reasonable and necessary in order to provide the required intensity of service to ensure the patient's safety. The patient's presenting symptoms, physical exam findings, and initial radiographic and laboratory data in the context of their chronic comorbidities is felt to place them at high risk for further clinical deterioration. Furthermore, it is not anticipated that the patient will be medically stable for discharge from the hospital within 2 midnights of admission.   * I certify that at the point of admission it is my clinical judgment that the patient will require inpatient hospital care spanning beyond 2 midnights from the point of admission due to high intensity of service, high risk for further deterioration and high frequency of surveillance required.*  Author: Nolberto Hanlon, MD 08/28/2022 8:36 PM  For on call review [email or web address].

## 2022-08-28 NOTE — ED Triage Notes (Signed)
Pt from home via EMS for c/o fall last night with x2 days of weakness and tingling in her legs. Denied EMS transport yesterday, but comes after her provider called EMS after telehealth visit today. Pt endorses inability to get up and that her legs are not working right.

## 2022-08-28 NOTE — Assessment & Plan Note (Signed)
Acute I discussed with patient that there are multiple etiologies that can contribute to leg weakness.  Including but not limited to stroke, spinal stenosis, neuropathy, deconditioning, electrolyte imbalances, etc. Because some of these etiologies can be life-threatening and/or permanent if not acutely treated that I recommend she be evaluated emergently in the emergency department. Patient was agreeable to this, I called EMS on her behalf.  911 operator was contacted and all questions were answered.  Operator reports EMS is in route.

## 2022-08-28 NOTE — Assessment & Plan Note (Signed)
Started about 4 weeks ago, resolved about 5 days ago.  This is the likely reason for patient electrolyte abnormalities.  At this time monitoring clinically

## 2022-08-28 NOTE — Assessment & Plan Note (Addendum)
Has a pattern of chronic falls at home.  Current fall is likely precipitated/exacerbated by patient having electrolyte abnormalities.  Will replete and trend Per neurology needed MRI of the C-spine but pt has been unable to have this done due to inability to climb stairs Also would likely benefit from inpatient rehab and aggressive physical therapy B12 and folate deficient, replete Check a CK-within normal limits

## 2022-08-28 NOTE — ED Provider Notes (Signed)
Accepted handoff at shift change from [facility]. Please see prior provider note for more detail.   Briefly: Patient is 57 y.o.   DDX: concern for Vomiting and diarrhea for 4 weeks, resolved 4 days ago after immodium. Endorses weakness, falling off toilet. No trauma. Electrolytes low. CTAP pending to look for possible cause / possible admission diagnosis. Won't likely feel better initially. Needs attempt at walking if consideration for discharge. 7.7 corrected ca.   Plan: On reevaluation patient with global weakness of bilateral lower extremities, and is unable to ambulate without assistance.  She was taken by wheelchair to the restroom a moment ago.  Patient endorsed some decrease sensation bilateral lower extremities and groin.  She reports that she had lost control of her bowels a few times.  She endorses some previous herniated disks in the back but denies any recent injury to the spine.  I independently interpreted CT abdomen pelvis which does not show any acute intra-abdominal abnormality to explain patient's symptoms.  She did have some sensory deficit on bilateral lower extremities, and was unable to ambulate without assistance which is different than her baseline.  I ordered an MRI which is pending at this time of the lumbar spine given history of some previous back injuries but overall with only mild to moderate concern for cauda equina, more clinical suspicion of acute dehydration electrolyte abnormalities as the reason for her weakness, spoke with the hospitalist, Dr. Maryjean Ka who agrees to admission at this time for weakness, hypomagnesemia, hypokalemia, hypocalcemia.    Olene Floss, PA-C 08/28/22 1816    Tegeler, Canary Brim, MD 08/28/22 [phone]

## 2022-08-28 NOTE — Assessment & Plan Note (Signed)
With slight QTc prolongation - corrected today IV calcium gluconate Telemetry

## 2022-08-28 NOTE — ED Notes (Signed)
Pt. Given a turkey sandwich and ginger ale 

## 2022-08-28 NOTE — Progress Notes (Signed)
Established Patient Office Visit  An home tele-health visit was completed today for this patient. I connected with  Shannon [name] on 08/28/22 utilizing audio/visual technology and verified that I am speaking with the correct person using two identifiers. The patient was located at their home, and I was located at the office of [facility] at [facility] during the encounter. I discussed the limitations of evaluation and management by telemedicine. The patient expressed understanding and agreed to proceed.    Subjective   Patient ID: Shannon [name], female    DOB: 1965/11/29  Age: 57 y.o. MRN: [id number]  Chief Complaint  Patient presents with   Extremity Weakness    Patient has a virtual for the above.  She has a medical history of pernicious anemia and iron deficiency anemia, anxiety, lumbar degenerative disc disease, hyperlipidemia, hypertension, substance abuse, morbid obesity, osteopenia, PUD, H. pylori, thiamine deficiency, and vitamin D deficiency.  She reports that she developed acute lower extremity weakness this morning around 3 AM when she woke up from sleep.  She attempted to ambulate to the bathroom, but fell.  She was unable to get herself back up, but she did crawl/drag herself across the floor and was able to call 911.  They came and helped her back to her bed.  She denies hitting her head but did hit her back.  Since then she has been experiencing some tingling and numbness to her legs and feels very weak in lower extremities.  She reports that she has had weakness to her legs in the past.  She reports that she is too weak to get on and off the toilet independently.  She lives alone.  She reports recent history of diarrhea for approximately 4 weeks.  She was evaluated by her primary care provider via video visit for this on 08/20/22 and was encouraged to proceed to the emergency department due to patient report of severe abdominal pain, green diarrhea, with ~7-10  episodes/day.  At that time she decided not to proceed to the ER.  She has been drinking Gatorade and ordered Imodium online.  Since taking the Imodium her diarrhea has stopped, last bowel movement was 5 days ago.    Review of Systems  Neurological:  Positive for tingling, sensory change and weakness.      Objective:     There were no vitals taken for this visit.   Physical Exam Comprehensive physical exam not completed today as office visit was conducted remotely.  Patient is alert and oriented x 4, appears to have appropriate judgment and behavior, does not appear to be in respiratory distress.  No results found for any visits on 08/28/22.    The 10-year ASCVD risk score (Arnett DK, et al., 2019) is: 12.6%    Assessment & Plan:   Problem List Items Addressed This Visit       Other   Bilateral leg weakness - Primary    Acute I discussed with patient that there are multiple etiologies that can contribute to leg weakness.  Including but not limited to stroke, spinal stenosis, neuropathy, deconditioning, electrolyte imbalances, etc. Because some of these etiologies can be life-threatening and/or permanent if not acutely treated that I recommend she be evaluated emergently in the emergency department. Patient was agreeable to this, I called EMS on her behalf.  911 operator was contacted and all questions were answered.  Operator reports EMS is in route.       Return if symptoms worsen  or fail to improve.    Elenore Paddy, NP

## 2022-08-28 NOTE — ED Notes (Signed)
Pt states she tried to use restroom for UA sample and was unable to produce any urine.

## 2022-08-28 NOTE — ED Notes (Addendum)
Pt aware urine sampled needed. Pt ambulated to bathroom and was unable to urinate for [name] at the moment, will try again later.

## 2022-08-28 NOTE — ED Provider Notes (Signed)
EMERGENCY DEPARTMENT AT [facility] Provider Note   CSN: [id number] Arrival date & time: 08/28/22  1253     History  Chief Complaint  Patient presents with   Fall   Weakness    Shannon [name] is a 57 y.o. female with PMHx degenerative joint disease, bipolar disorder, HLD, amemia, HTN, neuropathy who presents emergency room concern for generalized weakness x2 days.  Also complaining of generalized tingling in arms and legs.  Patient also reports 4 weeks of daily vomiting and diarrhea.  Patient took Imodium 3 days ago and reports no bowel movement or emesis since. Patient also stating that her mouth feels dry "like cotton".  Patient states that she fell 2 days ago due to her generalized weakness.  Denies head trauma, loss of consciousness, seizures.  Denies fever, chest pain, dyspnea, abdominal pain, dysuria, hematuria, hematochezia.   Fall  Weakness      Home Medications Prior to Admission medications   Medication Sig Start Date End Date Taking? Authorizing Provider  amLODipine (NORVASC) 5 MG tablet Take 1 tablet (5 mg total) by mouth daily. 05/14/21   Henson, [name], NP-C  atorvastatin (LIPITOR) 20 MG tablet Take 1 tablet (20 mg total) by mouth daily. 02/22/22   Henson, [name], NP-C  clonazePAM (KLONOPIN) 0.5 MG tablet Take 1 tablet (0.5 mg total) by mouth 2 (two) times daily as needed for anxiety. 08/13/22   Arfeen, Phillips Grout, MD  FLUoxetine (PROZAC) 20 MG capsule Take 1 capsule (20 mg total) by mouth daily. 08/13/22   Arfeen, Phillips Grout, MD  gabapentin (NEURONTIN) 300 MG capsule Take 1 capsule (300 mg total) by mouth 2 (two) times daily. 06/18/22   Glendale Chard, DO  linaclotide (LINZESS) 145 MCG CAPS capsule Take 1 capsule (145 mcg total) by mouth daily before breakfast. Patient not taking: Reported on 08/28/2022 02/12/22   Imogene Burn, MD  meloxicam (MOBIC) 15 MG tablet Take 1 tablet (15 mg total) by mouth daily as needed for pain. Patient not taking:  Reported on 08/28/2022 01/26/22   Richardean Sale, DO  Multiple Vitamins-Minerals (BARIATRIC MULTIVITAMINS/IRON) CAPS Take 1 capsule by mouth daily. 01/14/22   Henson, [name], NP-C  pantoprazole (PROTONIX) 40 MG tablet Take 1 tablet (40 mg total) by mouth 2 (two) times daily before a meal. 01/27/22   Henson, [name], NP-C  potassium chloride SA (KLOR-CON M) 20 MEQ tablet Take 1 tablet (20 mEq total) by mouth daily. Patient not taking: Reported on 08/28/2022 02/20/22   Hetty Blend L, NP-C  QUEtiapine (SEROQUEL) 25 MG tablet Take 1 tablet (25 mg total) by mouth 2 (two) times daily. 08/13/22 11/11/22  Arfeen, Phillips Grout, MD  QUEtiapine (SEROQUEL) 300 MG tablet Take 1 tablet (300 mg total) by mouth at bedtime. 08/13/22 11/11/22  Arfeen, Phillips Grout, MD      Allergies    Lamictal [lamotrigine]    Review of Systems   Review of Systems  Neurological:  Positive for weakness.    Physical Exam Updated Vital Signs BP 115/70 (BP Location: Right Arm)   Pulse (!) 105   Temp 98.3 F (36.8 C) (Oral)   Resp 20   Ht 5\' 4"  (1.626 m)   Wt 99.8 kg   SpO2 92%   BMI 37.76 kg/m  Physical Exam Vitals and nursing note reviewed.  Constitutional:      General: She is not in acute distress.    Appearance: She is not ill-appearing or toxic-appearing.  HENT:  Head: Normocephalic and atraumatic.     Mouth/Throat:     Mouth: Mucous membranes are moist.  Eyes:     General: No scleral icterus.       Right eye: No discharge.        Left eye: No discharge.     Conjunctiva/sclera: Conjunctivae normal.  Cardiovascular:     Rate and Rhythm: Normal rate and regular rhythm.     Pulses: Normal pulses.     Heart sounds: Normal heart sounds. No murmur heard. Pulmonary:     Effort: Pulmonary effort is normal. No respiratory distress.     Breath sounds: Normal breath sounds. No wheezing, rhonchi or rales.  Abdominal:     General: Abdomen is flat. Bowel sounds are normal. There is no distension.     Palpations: Abdomen  is soft. There is no mass.     Tenderness: There is no abdominal tenderness.  Musculoskeletal:     Right lower leg: No edema.     Left lower leg: No edema.  Skin:    General: Skin is warm and dry.     Capillary Refill: Capillary refill takes less than 2 seconds.     Findings: No rash.  Neurological:     General: No focal deficit present.     Mental Status: She is alert and oriented to person, place, and time. Mental status is at baseline.     Sensory: No sensory deficit.     Comments: GCS 15. Speech is goal oriented. No deficits appreciated to CN III-XII; symmetric eyebrow raise, no facial drooping, tongue midline. Patient has equal grip strength bilaterally with 4/5 strength against resistance in all major muscle groups bilaterally. Sensation to light touch intact.    Psychiatric:        Mood and Affect: Mood normal.     ED Results / Procedures / Treatments   Labs (all labs ordered are listed, but only abnormal results are displayed) Labs Reviewed  CBC WITH DIFFERENTIAL/PLATELET - Abnormal; Notable for the following components:      Result Value   RBC 3.85 (*)    Hemoglobin 11.1 (*)    HCT 33.1 (*)    RDW 17.2 (*)    All other components within normal limits  COMPREHENSIVE METABOLIC PANEL - Abnormal; Notable for the following components:   Potassium 3.1 (*)    Chloride 97 (*)    BUN <5 (*)    Calcium 6.3 (*)    Albumin 2.2 (*)    Alkaline Phosphatase 131 (*)    All other components within normal limits  MAGNESIUM - Abnormal; Notable for the following components:   Magnesium 1.4 (*)    All other components within normal limits  LIPASE, BLOOD  URINALYSIS, W/ REFLEX TO CULTURE (INFECTION SUSPECTED)    EKG EKG Interpretation Date/Time:  Friday August 28 2022 14:22:41 EDT Ventricular Rate:  97 PR Interval:  134 QRS Duration:  103 QT Interval:  387 QTC Calculation: 492 R Axis:   -19  Text Interpretation: Sinus rhythm Borderline left axis deviation Low voltage,  precordial leads Abnormal R-wave progression, early transition Borderline prolonged QT interval Baseline wander in lead(s) V2 Confirmed by Coralee Pesa ([id number]) on 08/28/2022 2:56:46 PM  Radiology No results found.  Procedures Procedures    Medications Ordered in ED Medications  potassium chloride SA (KLOR-CON M) CR tablet 40 mEq (has no administration in time range)  calcium carbonate (TUMS - dosed in mg elemental calcium) chewable tablet 200 mg of elemental  calcium (has no administration in time range)  magnesium oxide (MAG-OX) tablet 400 mg (has no administration in time range)  sodium chloride 0.9 % bolus 1,000 mL (1,000 mLs Intravenous New Bag/Given 08/28/22 1412)  iohexol (OMNIPAQUE) 300 MG/ML solution 100 mL (100 mLs Intravenous Contrast Given 08/28/22 1511)    ED Course/ Medical Decision Making/ A&P Clinical Course as of 08/28/22 1527  Fri Aug 28, 2022  1522 Vomiting and diarrhea for 4 weeks, resolved 4 days ago after immodium. Endorses weakness, falling off toilet. No trauma. Electrolytes low. CTAP pending to look for possible cause / possible admission diagnosis. Won't likely feel better initially. Needs attempt at walking if consideration for discharge. 7.7 corrected ca.  [CP]    Clinical Course User Index [CP] Olene Floss, PA-C                             Medical Decision Making  This patient presents to the ED for concern of weakness, this involves an extensive number of treatment options, and is a complaint that carries with it a high risk of complications and morbidity.  The differential diagnosis includes Ischemic stroke, intracerebral hemorrhage, subarachnoid hemorrhage, Guillain-Barr syndrome, hypoglycemia, electrolyte abnormality, sepsis, ACS, carbon monoxide poisoning, anemia, dehydration.   Co morbidities that complicate the patient evaluation  DJD, GAD, bipolar disorder, anemia, iron deficiency anemia, HLD, HTN   Lab Tests:  I Ordered, and  personally interpreted labs.  The pertinent results include:   -CMP: corrected calcium 7.7 -Mag: 1.4 -CBC: mild anemia (11.1); no leukocytosis -Lipase: within normal limits -UA: pending   Imaging Studies ordered:  I ordered imaging studies including  -CT abd/pelv: to assess for process given 4 weeks vomiting and diarrhea I independently visualized and interpreted imaging  I agree with the radiologist interpretation   Cardiac Monitoring: / EKG:  The patient was maintained on a cardiac monitor.  I personally viewed and interpreted the cardiac monitored which showed an underlying rhythm of: Sinus rhythm without acute ST changes or arrhythmias    Problem List / ED Course / Critical interventions / Medication management  Patient presents emergency room concern for generalized weakness x 3 days.  Patient also endorsing 4 weeks of diarrhea daily. Also endorsing hand/leg tingling.  Patient's weakness has progressed and caused her to fall onto floor while using bathroom.  Denies head trauma or loss of consciousness.  Denies any other infectious symptoms.  Patient has been without vomiting or diarrhea x 3 days after taking Imodium.  Patient also stating that she feels dehydrated-states that her mouth feels very dry. Started patient on IV fluid replacement.  Magnesium low (1.4).  Corrected calcium low (7.7). Potassium low (3.1).  Patient supplementation for these electrolytes.  No leukocytosis.  Mild anemia.  Lipase within normal limits.  UA pending - denying dysuria, hematuria, or abdominal pain. I have reviewed the patients home medicines and have made adjustments as needed  3:27 PM Care of Shannon [name] transferred to [employer] [facility] at the end of my shift as the patient will require reassessment once labs/imaging have resulted. Patient presentation, ED course, and plan of care discussed with review of all pertinent labs and imaging. Please see his/her note for further details regarding  further ED course and disposition. Plan at time of handoff is assess patient after electrolyte repletion.  If patient is still endorsing generalized weakness that prevents her from being able to walk, she may need inpatient admission.  Also obtaining CT abdomen pelvis to assess for process that has caused patient's diarrhea x 4 weeks.  This may be altered or completely changed at the discretion of the oncoming team pending results of further workup.    DDX: these are considered less likely due to history of present illness and physical exam findings -Ischemic stroke/ICH/SAH: no unilateral neurodeficits -sepsis: no fever; vital signs stable -ACS: EKG sinus rhythm and no acute ST changes -carbon monoxide poisoning: no nausea; AMS; vision changes; dyspnea   Social Determinants of Health:  none           Final Clinical Impression(s) / ED Diagnoses Final diagnoses:  None    Rx / DC Orders ED Discharge Orders     None         Dorthy Cooler, [place] 08/28/22 1527    Rozelle Logan, DO 08/28/22 1536

## 2022-08-28 NOTE — ED Notes (Signed)
ED TO INPATIENT HANDOFF REPORT  ED Nurse Name and Phone #: Raoul Pitch, RN  S Name/Age/Gender Shannon [name] 57 y.o. female Room/Bed: WA16/WA16  Code Status   Code Status: Full Code  Home/SNF/Other Home Patient oriented to: self, place, time, and situation Is this baseline? Yes   Triage Complete: Triage complete  Chief Complaint Hypocalcemia [E83.51]  Triage Note Pt from home via EMS for c/o fall last night with x2 days of weakness and tingling in her legs. Denied EMS transport yesterday, but comes after her provider called EMS after telehealth visit today. Pt endorses inability to get up and that her legs are not working right.   Allergies Allergies  Allergen Reactions   Lamictal [Lamotrigine] Rash    Level of Care/Admitting Diagnosis ED Disposition     ED Disposition  Admit   Condition  --   Comment  Hospital Area: [facility] [[id number]]  Level of Care: Telemetry [5]  Admit to tele based on following criteria: Monitor QTC interval  May admit patient to [name] or [facility] if equivalent level of care is available:: No  Covid Evaluation: Asymptomatic - no recent exposure (last 10 days) testing not required  Diagnosis: Hypocalcemia [275.41.ICD-9-CM]  Admitting Physician: Nolberto Hanlon [[id number]]  Attending Physician: Nolberto Hanlon [[id number]]  Certification:: I certify this patient will need inpatient services for at least 2 midnights  Estimated Length of Stay: 3          B Medical/Surgery History Past Medical History:  Diagnosis Date   Allergic rhinitis    Allergic rhinitis 01/25/2009        ANEMIA-IRON DEFICIENCY 01/25/2009   Anxiety    Backache 01/25/2009   Qualifier: Diagnosis of  By: Jonny Ruiz MD, Len Blalock    Bipolar 1 disorder [facility])    Chronic pain syndrome    Complete rotator cuff tear 07/21/2013   Complete tear of right rotator cuff 07/21/2013   Contact lens/glasses fitting    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   DISC  DISEASE, LUMBAR 01/25/2009   Essential hypertension 01/25/2009   On Bystolic    GAD (generalized anxiety disorder) 12/28/2013   GERD (gastroesophageal reflux disease)    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   Headache, acute 12/20/2013   Hx of laparoscopic gastric banding 09/03/2010   Surgery date: 07/17/10    HYPERLIPIDEMIA 01/25/2009   Hyperlipidemia with target LDL less than 130 01/25/2009   HYPERTENSION 01/25/2009   Hypokalemia, inadequate intake 09/01/2011   Insomnia 04/04/2011   INSOMNIA-SLEEP DISORDER-UNSPEC 04/30/2009   Qualifier: Diagnosis of  By: Jonny Ruiz MD, Len Blalock    Iron deficiency anemia 01/25/2009        Labyrinthitis 02/19/2014   Leiomyoma of uterus, unspecified 08/13/2008   Overview:  Leiomyoma Of The Uterus  10/1 IMO update   Localized edema 06/12/2015   MANIC DEPRESSIVE ILLNESS 01/25/2009   pt is unsue if this is her specifc dx   Mixed bipolar I disorder (HCC) 07/12/2012   Morbid obesity (HCC) 01/25/2009   Night sweats    Osteopenia determined by x-ray 06/13/2021   Other abnormal glucose 10/26/2012   Palpitations 10/25/2013   PEPTIC ULCER DISEASE, HELICOBACTER PYLORI POSITIVE 10/03/2009   Peripheral edema 06/25/2015   Pernicious anemia 03/28/2012   Piriformis syndrome of both sides 09/02/2018   PUD (peptic ulcer disease) 10/03/2009        Right shoulder pain 05/03/2013   Dg Shoulder Right  05/03/2013   CLINICAL DATA Pain.  EXAM RIGHT SHOULDER - 2+  VIEW  COMPARISON None.  FINDINGS Acromioclavicular and glenohumeral degenerative change present. Questionable calcific density noted in the region of the supraspinatus space, possibly representing calcific supraspinatus tendinitis. This could represent a sclerotic density in the acromion. MRI of the right shoulder suggest   Sleep apnea 08/12/2016   SMOKER 01/25/2009   Qualifier: Diagnosis of  By: Jonny Ruiz MD, Len Blalock    Subacromial bursitis 05/17/2013   SUBSTANCE ABUSE 11/06/2009        Thiamin deficiency 10/30/2012   Tobacco abuse 06/09/2010   Visual  disturbance 05/03/2013   Vitamin D deficiency 10/27/2012   Past Surgical History:  Procedure Laterality Date   ABDOMINAL HYSTERECTOMY     BLADDER SURGERY     s/p with ?diverticulitis   HAND TENDON SURGERY  1991   s/p-Right-index and middle   LAPAROSCOPIC GASTRIC BAND REMOVAL WITH LAPAROSCOPIC GASTRIC SLEEVE RESECTION N/A 08/17/2016   Procedure: LAPAROSCOPIC GASTRIC BAND REMOVAL WITH LAPAROSCOPIC GASTRIC SLEEVE RESECTION WITH UPPER ENDO;  Surgeon: Ovidio Kin, MD;  Location: [facility];  Service: General;  Laterality: N/A;   LAPAROSCOPIC GASTRIC BANDING  07/14/10   SHOULDER ARTHROSCOPY Right 07/21/2013   Procedure: RIGHT ARTHROSCOPY SHOULDER DEBRIDMENT EXTENTSIVE,ARTHROSCOPIC REMOVE LOOSE FOREIGN BODY, BICEPS TENOLYSIS ;  Surgeon: Eulas Post, MD;  Location:  SURGERY CENTER;  Service: Orthopedics;  Laterality: Right;   TUBAL LIGATION       A IV Location/Drains/Wounds Patient Lines/Drains/Airways Status     Active Line/Drains/Airways     Name Placement date Placement time Site Days   Peripheral IV 08/28/22 20 G 1" Anterior;Distal;Left Forearm 08/28/22  1410  Forearm  less than 1            Intake/Output Last 24 hours No intake or output data in the 24 hours ending 08/28/22 2131  Labs/Imaging Results for orders placed or performed during the hospital encounter of 08/28/22 (from the past 48 hour(s))  CBC with Differential     Status: Abnormal   Collection Time: 08/28/22  1:50 PM  Result Value Ref Range   WBC 6.9 4.0 - 10.5 K/uL   RBC 3.85 (L) 3.87 - 5.11 MIL/uL   Hemoglobin 11.1 (L) 12.0 - 15.0 g/dL   HCT 16.1 (L) 09.6 - 04.5 %   MCV 86.0 80.0 - 100.0 fL   MCH 28.8 26.0 - 34.0 pg   MCHC 33.5 30.0 - 36.0 g/dL   RDW 40.9 (H) 81.1 - 91.4 %   Platelets 308 150 - 400 K/uL   nRBC 0.0 0.0 - 0.2 %   Neutrophils Relative % 65 %   Neutro Abs 4.4 1.7 - 7.7 K/uL   Lymphocytes Relative 27 %   Lymphs Abs 1.8 0.7 - 4.0 K/uL   Monocytes Relative 7 %   Monocytes Absolute 0.5  0.1 - 1.0 K/uL   Eosinophils Relative 1 %   Eosinophils Absolute 0.1 0.0 - 0.5 K/uL   Basophils Relative 0 %   Basophils Absolute 0.0 0.0 - 0.1 K/uL   Immature Granulocytes 0 %   Abs Immature Granulocytes 0.03 0.00 - 0.07 K/uL    Comment: Performed at [facility], [address] [place], [place] [address]  Lipase, blood     Status: None   Collection Time: 08/28/22  1:50 PM  Result Value Ref Range   Lipase 19 11 - 51 U/L    Comment: Performed at [facility], [address] [place], [place] [address]  Phosphorus     Status: None  Collection Time: 08/28/22  1:50 PM  Result Value Ref Range   Phosphorus 3.2 2.5 - 4.6 mg/dL    Comment: HEMOLYSIS AT THIS LEVEL MAY AFFECT RESULT Performed at [facility], [address], [place], [place] [address]   Comprehensive metabolic panel     Status: Abnormal   Collection Time: 08/28/22  1:58 PM  Result Value Ref Range   Sodium 137 135 - 145 mmol/L   Potassium 3.1 (L) 3.5 - 5.1 mmol/L   Chloride 97 (L) 98 - 111 mmol/L   CO2 31 22 - 32 mmol/L   Glucose, Bld 95 70 - 99 mg/dL    Comment: Glucose reference range applies only to samples taken after fasting for at least 8 hours.   BUN <5 (L) 6 - 20 mg/dL   Creatinine, Ser 4.69 0.44 - 1.00 mg/dL   Calcium 6.3 (LL) 8.9 - 10.3 mg/dL    Comment: CRITICAL RESULT CALLED TO, READ BACK BY AND VERIFIED WITH B. SAVOI, RN 08/28/22 1452 J. COLE   Total Protein 6.5 6.5 - 8.1 g/dL   Albumin 2.2 (L) 3.5 - 5.0 g/dL   AST 21 15 - 41 U/L   ALT 17 0 - 44 U/L   Alkaline Phosphatase 131 (H) 38 - 126 U/L   Total Bilirubin 0.8 0.3 - 1.2 mg/dL   GFR, Estimated >62 >95 mL/min    Comment: (NOTE) Calculated using the CKD-EPI Creatinine Equation (2021)    Anion gap 9 5 - 15    Comment: Performed at [facility], [address] [place], [place] [address]  Magnesium     Status: Abnormal   Collection Time: 08/28/22  1:58 PM  Result Value Ref  Range   Magnesium 1.4 (L) 1.7 - 2.4 mg/dL    Comment: Performed at [facility], [address] [place], [place] [address]  Vitamin B12     Status: None   Collection Time: 08/28/22  5:45 PM  Result Value Ref Range   Vitamin B-12 279 180 - 914 pg/mL    Comment: (NOTE) This assay is not validated for testing neonatal or myeloproliferative syndrome specimens for Vitamin B12 levels. Performed at [facility], [address], [place], [place] [address]   Folate     Status: Abnormal   Collection Time: 08/28/22  5:45 PM  Result Value Ref Range   Folate 4.6 (L) >5.9 ng/mL    Comment: Performed at [facility], [address] [place], [place] [address]   MR LUMBAR SPINE WO CONTRAST  Result Date: 08/28/2022 CLINICAL DATA:  Low back pain EXAM: MRI LUMBAR SPINE WITHOUT CONTRAST TECHNIQUE: Multiplanar, multisequence MR imaging of the lumbar spine was performed. No intravenous contrast was administered. COMPARISON:  None Available. FINDINGS: Segmentation:  Standard. Alignment:  Physiologic. Vertebrae:  No fracture, evidence of discitis, or bone lesion. Conus medullaris and cauda equina: Conus extends to the L1 level. Conus and cauda equina appear normal. Paraspinal and other soft tissues: Negative. Disc levels: L1-L2: Normal disc space and facet joints. No spinal canal stenosis. No neural foraminal stenosis. L2-L3: Normal disc space and facet joints. No spinal canal stenosis. No neural foraminal stenosis. L3-L4: Normal disc space and facet joints. No spinal canal stenosis. No neural foraminal stenosis. L4-L5: Small disc bulge and mild facet hypertrophy. No spinal canal stenosis. No neural foraminal stenosis. L5-S1: Small disc bulge and mild facet hypertrophy. No spinal canal stenosis. No neural foraminal stenosis. Visualized sacrum: Normal. IMPRESSION:  Mild lower lumbar degenerative disc disease without spinal canal or neural foraminal stenosis.  Electronically Signed   By: Deatra Robinson M.D.   On: 08/28/2022 19:04   CT ABDOMEN PELVIS W CONTRAST  Result Date: 08/28/2022 CLINICAL DATA:  Abdominal pain. Fall last night with weakness and tingling in legs. EXAM: CT ABDOMEN AND PELVIS WITH CONTRAST TECHNIQUE: Multidetector CT imaging of the abdomen and pelvis was performed using the standard protocol following bolus administration of intravenous contrast. RADIATION DOSE REDUCTION: This exam was performed according to the departmental dose-optimization program which includes automated exposure control, adjustment of the mA and/or kV according to patient size and/or use of iterative reconstruction technique. CONTRAST:  OMNIPAQUE IOHEXOL 300 MG/ML  SOLN COMPARISON:  CT 12/25/2021. FINDINGS: Lower chest: There are several tiny sub 5 mm lung nodules identified the bases. These are essentially unchanged from previous examination such as left lower lobe on series 6, image 23, middle lobe image 19 and 9, right lower lobe on image 11 amongst others. These stable. There is some basilar scar or atelectasis identified. No pleural effusion. Small hiatal hernia. There is slight wall thickening of the distal esophagus. Trace pericardial fluid. Hepatobiliary: Fatty liver infiltration. Patent portal vein. Gallbladder is present. Pancreas: Diffuse atrophy of the pancreas without mass lesion. Spleen: Normal in size without focal abnormality. Adrenals/Urinary Tract: Adrenal glands are preserved. Mild renal atrophy. No collecting system dilatation or enhancing renal mass. The ureters have normal course and caliber extending down to the bladder. Preserved contours of the urinary bladder. Stomach/Bowel: No oral contrast. Large bowel is nondilated. There are scattered areas of some low-density along the wall of the colon. Please correlate for any known history of chronic inflammatory state. Normal appendix in the right lower quadrant, hemipelvis extending medial to the cecum.  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 Vascular/Lymphatic: Aortic atherosclerosis. No enlarged abdominal or pelvic lymph nodes. Reproductive: Status post hysterectomy. No adnexal masses. Other: Mild anasarca.  No free air or free fluid. Musculoskeletal: Degenerative changes of the spine and pelvis particularly the right hip. IMPRESSION: No bowel obstruction, free air or free fluid. Previous surgical changes of gastric sleeve with a hiatal hernia and slight wall thickening of the distal esophagus. Normal appendix. Fatty liver infiltration. Stable multiple sub 5 mm lung nodules at the lung bases. Recommend follow-up in 1 year. Electronically Signed   By: Karen Kays M.D.   On: 08/28/2022 15:39    Pending Labs Unresulted Labs (From admission, onward)     Start     Ordered   09/04/22 0500  Creatinine, serum  (enoxaparin (LOVENOX)    CrCl >/= 30 ml/min)  Weekly,   R     Comments: while on enoxaparin therapy    08/28/22 2059   08/29/22 0500  Magnesium  Tomorrow morning,   R        08/28/22 2049   08/29/22 0500  APTT  Tomorrow morning,   R        08/28/22 2059   08/29/22 0500  Protime-INR  Tomorrow morning,   R        08/28/22 2059   08/29/22 0500  Basic metabolic panel  Tomorrow morning,   R        08/28/22 2059   08/29/22 0500  CBC  Tomorrow morning,   R        08/28/22 2059   08/28/22 2359  Calcium, ionized  Once,   STAT        08/28/22 2048  08/28/22 2359  Basic metabolic panel  Once-Timed,   STAT        08/28/22 2048   08/28/22 2126  Blood gas, arterial  ONCE - URGENT,   R       Question:  Room air or oxygen  Answer:  Room air   08/28/22 2125   08/28/22 2100  CBC  (enoxaparin (LOVENOX)    CrCl >/= 30 ml/min)  Once,   R       Comments: Baseline for enoxaparin therapy IF NOT ALREADY DRAWN.  Notify MD if PLT < 100 K.    08/28/22 2059   08/28/22 2100  Creatinine, serum  (enoxaparin (LOVENOX)    CrCl >/= 30 ml/min)  Once,   R       Comments: Baseline for enoxaparin therapy IF NOT ALREADY  DRAWN.    08/28/22 2059   08/28/22 2100  HIV Antibody (routine testing w rflx)  (HIV Antibody (Routine testing w reflex) panel)  Once,   R        08/28/22 2059   08/28/22 2043  CK  Add-on,   AD        08/28/22 2042   08/28/22 1815  VITAMIN D 25 Hydroxy (Vit-D Deficiency, Fractures)  Add-on,   AD        08/28/22 1814   08/28/22 1627  Gastrointestinal Panel by PCR , Stool  (Gastrointestinal Panel by PCR, Stool                                                                                                                                                     **Does Not include CLOSTRIDIUM DIFFICILE testing. **If CDIFF testing is needed, place order from the "C Difficile Testing" order set.**)  Once,   URGENT        08/28/22 1626   08/28/22 1627  C Difficile Quick Screen w PCR reflex  (C Difficile quick screen w PCR reflex panel )  Once, for 24 hours,   URGENT       References:    CDiff Information Tool   08/28/22 1626   08/28/22 1406  Urinalysis, w/ Reflex to Culture (Infection Suspected) -Urine, Clean Catch  Once,   URGENT       Question:  Specimen Source  Answer:  Urine, Clean Catch   08/28/22 1405            Vitals/Pain Today's Vitals   08/28/22 1630 08/28/22 1709 08/28/22 2008 08/28/22 2130  BP: (!) 125/97  (!) 153/98   Pulse: 100  99   Resp: 18  18   Temp:  98.5 F (36.9 C)  98.2 F (36.8 C)  TempSrc:  Oral  Oral  SpO2: 90%  99%   Weight:      Height:        Isolation Precautions Enteric precautions (  UV disinfection)  Medications Medications  calcium gluconate 1 g/ 50 mL sodium chloride IVPB (1,000 mg Intravenous New Bag/Given 08/28/22 2124)  magnesium sulfate IVPB 2 g 50 mL (has no administration in time range)  magnesium oxide (MAG-OX) tablet 400 mg (has no administration in time range)  folic acid (FOLVITE) tablet 1 mg (1 mg Oral Given 08/28/22 2122)  cyanocobalamin (VITAMIN B12) tablet 2,000 mcg (has no administration in time range)  clonazePAM (KLONOPIN) tablet 0.5  mg (0.5 mg Oral Given 08/28/22 2125)  gabapentin (NEURONTIN) capsule 300 mg (300 mg Oral Given 08/28/22 2122)  atorvastatin (LIPITOR) tablet 20 mg (has no administration in time range)  QUEtiapine (SEROQUEL) tablet 25 mg (has no administration in time range)  QUEtiapine (SEROQUEL) tablet 300 mg (300 mg Oral Given 08/28/22 2122)  amLODipine (NORVASC) tablet 5 mg (has no administration in time range)  pantoprazole (PROTONIX) EC tablet 40 mg (has no administration in time range)  enoxaparin (LOVENOX) injection 40 mg (has no administration in time range)  0.9 %  sodium chloride infusion (has no administration in time range)  acetaminophen (TYLENOL) tablet 650 mg (has no administration in time range)    Or  acetaminophen (TYLENOL) suppository 650 mg (has no administration in time range)  polyethylene glycol (MIRALAX / GLYCOLAX) packet 17 g (has no administration in time range)  sodium chloride flush (NS) 0.9 % injection 3 mL (3 mLs Intravenous Given 08/28/22 2125)  sodium chloride 0.9 % bolus 1,000 mL (0 mLs Intravenous Stopped 08/28/22 1652)  iohexol (OMNIPAQUE) 300 MG/ML solution 100 mL (100 mLs Intravenous Contrast Given 08/28/22 1511)  potassium chloride SA (KLOR-CON M) CR tablet 40 mEq (40 mEq Oral Given 08/28/22 1531)  calcium carbonate (TUMS - dosed in mg elemental calcium) chewable tablet 200 mg of elemental calcium (200 mg of elemental calcium Oral Given 08/28/22 1531)  magnesium oxide (MAG-OX) tablet 400 mg (400 mg Oral Given 08/28/22 1531)  sodium chloride 0.9 % bolus 1,000 mL (1,000 mLs Intravenous New Bag/Given 08/28/22 1706)  potassium chloride (KLOR-CON) packet 40 mEq (40 mEq Oral Given 08/28/22 2050)  cyanocobalamin (VITAMIN B12) injection 1,000 mcg (1,000 mcg Intramuscular Given 08/28/22 2119)    Mobility manual wheelchair     Focused Assessments Cardiac Assessment Handoff:    Lab Results  Component Value Date   CKTOTAL 182 (H) 04/16/2020   Lab Results  Component Value Date   DDIMER 0.89  (H) 06/12/2015   Does the Patient currently have chest pain? No    R Recommendations: See Admitting Provider Note  Report given to: Consuello Masse, RN  Additional Notes: Observation of QTC interval, fluid and electrolyte replacement.

## 2022-08-29 ENCOUNTER — Encounter (HOSPITAL_COMMUNITY): Payer: Self-pay | Admitting: Internal Medicine

## 2022-08-29 DIAGNOSIS — I10 Essential (primary) hypertension: Secondary | ICD-10-CM

## 2022-08-29 DIAGNOSIS — K649 Unspecified hemorrhoids: Secondary | ICD-10-CM | POA: Diagnosis present

## 2022-08-29 DIAGNOSIS — R197 Diarrhea, unspecified: Secondary | ICD-10-CM

## 2022-08-29 DIAGNOSIS — K219 Gastro-esophageal reflux disease without esophagitis: Secondary | ICD-10-CM

## 2022-08-29 LAB — BASIC METABOLIC PANEL
Anion gap: 9 (ref 5–15)
BUN: <5 mg/dL — ABNORMAL LOW (ref 6–20)
CO2: 28 mmol/L (ref 22–32)
Calcium: 6.4 mg/dL — CL (ref 8.9–10.3)
Chloride: 101 mmol/L (ref 98–111)
Creatinine, Ser: 0.84 mg/dL (ref 0.44–1.00)
GFR, Estimated: >60 mL/min (ref >60)
Glucose, Bld: 142 mg/dL — ABNORMAL HIGH (ref 70–99)
Potassium: 2.9 mmol/L — ABNORMAL LOW (ref 3.5–5.1)
Sodium: 138 mmol/L (ref 135–145)

## 2022-08-29 LAB — CBC
HCT: 30.7 % — ABNORMAL LOW (ref 36.0–46.0)
Hemoglobin: 9.9 g/dL — ABNORMAL LOW (ref 12.0–15.0)
MCH: 28.6 pg (ref 26.0–34.0)
MCHC: 32.2 g/dL (ref 30.0–36.0)
MCV: 88.7 fL (ref 80.0–100.0)
Platelets: 244 10*3/uL (ref 150–400)
RBC: 3.46 MIL/uL — ABNORMAL LOW (ref 3.87–5.11)
RDW: 17.6 % — ABNORMAL HIGH (ref 11.5–15.5)
WBC: 5.5 10*3/uL (ref 4.0–10.5)
nRBC: 0 % (ref 0.0–0.2)

## 2022-08-29 LAB — C DIFFICILE QUICK SCREEN W PCR REFLEX
C Diff antigen: POSITIVE — AB
C Diff toxin: NEGATIVE

## 2022-08-29 LAB — CK: Total CK: 54 U/L (ref 38–234)

## 2022-08-29 LAB — CLOSTRIDIUM DIFFICILE BY PCR, REFLEXED: Toxigenic C. Difficile by PCR: POSITIVE — AB

## 2022-08-29 LAB — PROTIME-INR
INR: 1 (ref 0.8–1.2)
Prothrombin Time: 13.7 seconds (ref 11.4–15.2)

## 2022-08-29 LAB — HIV ANTIBODY (ROUTINE TESTING W REFLEX): HIV Screen 4th Generation wRfx: NONREACTIVE

## 2022-08-29 LAB — APTT: aPTT: 26 seconds (ref 24–36)

## 2022-08-29 LAB — MAGNESIUM: Magnesium: 1.8 mg/dL (ref 1.7–2.4)

## 2022-08-29 MED ORDER — CALCIUM GLUCONATE-NACL 1-0.675 GM/50ML-% IV SOLN
1.0000 g | Freq: Once | INTRAVENOUS | Status: AC
  Administered 2022-08-29: 1000 mg via INTRAVENOUS
  Filled 2022-08-29: qty 50

## 2022-08-29 MED ORDER — POTASSIUM CHLORIDE CRYS ER 20 MEQ PO TBCR
40.0000 meq | EXTENDED_RELEASE_TABLET | Freq: Once | ORAL | Status: AC
  Administered 2022-08-29: 40 meq via ORAL
  Filled 2022-08-29: qty 2

## 2022-08-29 MED ORDER — HYDROCORT-PRAMOXINE (PERIANAL) 1-1 % EX FOAM
1.0000 | Freq: Two times a day (BID) | CUTANEOUS | Status: DC
  Administered 2022-08-29 – 2022-09-02 (×8): 1 via RECTAL
  Filled 2022-08-29 (×3): qty 10

## 2022-08-29 MED ORDER — POTASSIUM CHLORIDE CRYS ER 20 MEQ PO TBCR
20.0000 meq | EXTENDED_RELEASE_TABLET | Freq: Once | ORAL | Status: AC
  Administered 2022-08-29: 20 meq via ORAL
  Filled 2022-08-29: qty 1

## 2022-08-29 MED ORDER — POTASSIUM CHLORIDE CRYS ER 20 MEQ PO TBCR
40.0000 meq | EXTENDED_RELEASE_TABLET | Freq: Two times a day (BID) | ORAL | Status: DC
  Administered 2022-08-29 – 2022-09-01 (×6): 40 meq via ORAL
  Filled 2022-08-29 (×7): qty 2

## 2022-08-29 MED ORDER — POTASSIUM CHLORIDE 10 MEQ/100ML IV SOLN
10.0000 meq | INTRAVENOUS | Status: AC
  Administered 2022-08-29 (×4): 10 meq via INTRAVENOUS
  Filled 2022-08-29 (×3): qty 100

## 2022-08-29 MED ORDER — TRAMADOL HCL 50 MG PO TABS
50.0000 mg | ORAL_TABLET | Freq: Four times a day (QID) | ORAL | Status: DC | PRN
  Administered 2022-08-29 – 2022-09-01 (×11): 50 mg via ORAL
  Filled 2022-08-29 (×12): qty 1

## 2022-08-29 MED ORDER — FENTANYL CITRATE PF 50 MCG/ML IJ SOSY: 12.5000 ug | PREFILLED_SYRINGE | INTRAMUSCULAR | Status: DC | PRN

## 2022-08-29 NOTE — Assessment & Plan Note (Signed)
Continue PPI ?

## 2022-08-29 NOTE — Progress Notes (Signed)
Date and time results received: 08/29/22 0137 (use smartphrase ".now" to insert current time)  Test: BMP Critical Value: Ca= 6.4  Name of Provider Notified: MD on call, NP Virgel Manifold  Orders Received? Or Actions Taken?:  waiting for order.

## 2022-08-29 NOTE — Progress Notes (Signed)
Progress Note   Patient: Shannon [name] ZOX:[id number] DOB: [birth year] DOA: 08/28/2022     1 DOS: the patient was seen and examined on 08/29/2022   Brief hospital course: SALLEY OLEN is a 57 y.o. female with medical history significant of obesity and other medical disorders as listed below.  Patient reports that she has had several episodes of falling at least 12 at home which are preceded by lower extremity weakness, and some tremors.  Subsequently after falling, patient is unable to get back home and has had to have EMS come help her out.   Patient reports that 4 weeks ago she was in her usual state of health when she had a new onset of diarrhea that lasted till about 5 days ago.  At the time patient took some Imodium and patient has had no more diarrhea since then no vomiting no fever no abdominal pain.  Last evening at approximately 3 AM patient was getting up to use the bathroom when she had another episode of weakness/slight trembling of bilateral lower extremities and falling on the floor.  Patient was unable to get back up.  Had to crawl to the phone, talk to her PCP, subsequently talked to her EMS provider and come to the ER.  Patient reports that the EMS found the patient on the floor.  Patient was helped by the EMS and brought to [facility] ER.  Admitted, IV fluid resuscitation, electrolyte repletion.  Patient developed acute diarrheal symptoms again on admission after holding Imodium for a few days.  Assessment and Plan: * Fall at home, initial encounter Has a pattern of chronic falls at home.  Current fall is likely precipitated/exacerbated by patient having electrolyte abnormalities.  Will replete and trend B12 and folate deficient, replete Check a CK-within normal limits  Hemorrhoid Exacerbated by ongoing diarrhea Proctofoam  Diarrhea Started about 4 weeks ago, resolved about 5 days ago after taking Imodium This has resumed.  Likely infectious and started after eating a [employer]. GI panel pending C. Difficile Declines Imodium  Hypocalcemia With slight QTc prolongation - corrected today IV calcium gluconate Telemetry  Folate deficiency Replete folate  GERD (gastroesophageal reflux disease) Continue PPI  GAD (generalized anxiety disorder) Klonopin  Mixed bipolar I disorder (HCC) Continue Seroquel Klonopin Neurontin  Hypokalemia Replete and trend  Essential hypertension Continue Norvasc  Hyperlipidemia with target LDL less than 130 Continue Lipitor        Subjective: Feels worse today.  She reports ongoing diarrhea.  She reports anal and vaginal pain with this.  She reports increasing hemorrhoids and lower abdominal pressure.  She denies significant sick contacts.  She reports getting sick after eating at [employer].  Physical Exam: Vitals:   08/28/22 2130 08/28/22 2241 08/29/22 0230 08/29/22 0750  BP:  113/60 91/72 133/79  Pulse:  97 95 94  Resp:  18 18 18   Temp: 98.2 F (36.8 C) 97.7 F (36.5 C) 98 F (36.7 C) 98.5 F (36.9 C)  TempSrc: Oral Oral Oral Oral  SpO2:  98% 93% 94%  Weight:  109.2 kg    Height:  5\' 4"  (1.626 m)     Physical Examination: General appearance - ill-appearing and crying Chest -normal effort Heart - normal rate and regular rhythm Abdomen - soft, nontender, nondistended, no masses or organomegaly Pelvic -normal external genitalia there is no protrusion of any GYN parts past the introitus  Data Reviewed: Results for orders placed or performed during the hospital encounter of 08/28/22 (  from the past 24 hour(s))  CBC with Differential     Status: Abnormal   Collection Time: 08/28/22  1:50 PM  Result Value Ref Range   WBC 6.9 4.0 - 10.5 K/uL   RBC 3.85 (L) 3.87 - 5.11 MIL/uL   Hemoglobin 11.1 (L) 12.0 - 15.0 g/dL   HCT 40.9 (L) 81.1 - 91.4 %   MCV 86.0 80.0 - 100.0 fL   MCH 28.8 26.0 - 34.0 pg   MCHC 33.5 30.0 - 36.0 g/dL   RDW 78.2 (H) 95.6 - 21.3 %   Platelets 308 150 - 400 K/uL    nRBC 0.0 0.0 - 0.2 %   Neutrophils Relative % 65 %   Neutro Abs 4.4 1.7 - 7.7 K/uL   Lymphocytes Relative 27 %   Lymphs Abs 1.8 0.7 - 4.0 K/uL   Monocytes Relative 7 %   Monocytes Absolute 0.5 0.1 - 1.0 K/uL   Eosinophils Relative 1 %   Eosinophils Absolute 0.1 0.0 - 0.5 K/uL   Basophils Relative 0 %   Basophils Absolute 0.0 0.0 - 0.1 K/uL   Immature Granulocytes 0 %   Abs Immature Granulocytes 0.03 0.00 - 0.07 K/uL  Lipase, blood     Status: None   Collection Time: 08/28/22  1:50 PM  Result Value Ref Range   Lipase 19 11 - 51 U/L  Phosphorus     Status: None   Collection Time: 08/28/22  1:50 PM  Result Value Ref Range   Phosphorus 3.2 2.5 - 4.6 mg/dL  Comprehensive metabolic panel     Status: Abnormal   Collection Time: 08/28/22  1:58 PM  Result Value Ref Range   Sodium 137 135 - 145 mmol/L   Potassium 3.1 (L) 3.5 - 5.1 mmol/L   Chloride 97 (L) 98 - 111 mmol/L   CO2 31 22 - 32 mmol/L   Glucose, Bld 95 70 - 99 mg/dL   BUN <5 (L) 6 - 20 mg/dL   Creatinine, Ser 0.86 0.44 - 1.00 mg/dL   Calcium 6.3 (LL) 8.9 - 10.3 mg/dL   Total Protein 6.5 6.5 - 8.1 g/dL   Albumin 2.2 (L) 3.5 - 5.0 g/dL   AST 21 15 - 41 U/L   ALT 17 0 - 44 U/L   Alkaline Phosphatase 131 (H) 38 - 126 U/L   Total Bilirubin 0.8 0.3 - 1.2 mg/dL   GFR, Estimated >57 >84 mL/min   Anion gap 9 5 - 15  Magnesium     Status: Abnormal   Collection Time: 08/28/22  1:58 PM  Result Value Ref Range   Magnesium 1.4 (L) 1.7 - 2.4 mg/dL  Vitamin O96     Status: None   Collection Time: 08/28/22  5:45 PM  Result Value Ref Range   Vitamin B-12 279 180 - 914 pg/mL  Folate     Status: Abnormal   Collection Time: 08/28/22  5:45 PM  Result Value Ref Range   Folate 4.6 (L) >5.9 ng/mL  VITAMIN D 25 Hydroxy (Vit-D Deficiency, Fractures)     Status: None   Collection Time: 08/28/22  7:03 PM  Result Value Ref Range   Vit D, 25-Hydroxy 62.84 30 - 100 ng/mL  Urinalysis, w/ Reflex to Culture (Infection Suspected) -Urine, Clean  Catch     Status: Abnormal   Collection Time: 08/28/22  9:17 PM  Result Value Ref Range   Specimen Source URINE, CLEAN CATCH    Color, Urine YELLOW YELLOW   APPearance CLEAR  CLEAR   Specific Gravity, Urine >1.046 (H) 1.005 - 1.030   pH 7.0 5.0 - 8.0   Glucose, UA NEGATIVE NEGATIVE mg/dL   Hgb urine dipstick NEGATIVE NEGATIVE   Bilirubin Urine NEGATIVE NEGATIVE   Ketones, ur NEGATIVE NEGATIVE mg/dL   Protein, ur NEGATIVE NEGATIVE mg/dL   Nitrite NEGATIVE NEGATIVE   Leukocytes,Ua NEGATIVE NEGATIVE   RBC / HPF 0-5 0 - 5 RBC/hpf   WBC, UA 0-5 0 - 5 WBC/hpf   Bacteria, UA RARE (A) NONE SEEN   Squamous Epithelial / HPF 6-10 0 - 5 /HPF   Mucus PRESENT   Blood gas, arterial     Status: Abnormal   Collection Time: 08/28/22  9:40 PM  Result Value Ref Range   O2 Content ROOM AIR L/min   pH, Arterial 7.39 7.35 - 7.45   pCO2 arterial 48 32 - 48 mmHg   pO2, Arterial 71 (L) 83 - 108 mmHg   Bicarbonate 29.1 (H) 20.0 - 28.0 mmol/L   Acid-Base Excess 3.3 (H) 0.0 - 2.0 mmol/L   O2 Saturation 97.3 %   Patient temperature 36.8    Collection site RIGHT RADIAL    Drawn by [id number]    Allens test (pass/fail) PASS PASS  CK     Status: None   Collection Time: 08/29/22  1:06 AM  Result Value Ref Range   Total CK 54 38 - 234 U/L  Magnesium     Status: None   Collection Time: 08/29/22  1:06 AM  Result Value Ref Range   Magnesium 1.8 1.7 - 2.4 mg/dL  APTT     Status: None   Collection Time: 08/29/22  1:06 AM  Result Value Ref Range   aPTT 26 24 - 36 seconds  Protime-INR     Status: None   Collection Time: 08/29/22  1:06 AM  Result Value Ref Range   Prothrombin Time 13.7 11.4 - 15.2 seconds   INR 1.0 0.8 - 1.2  Basic metabolic panel     Status: Abnormal   Collection Time: 08/29/22  1:06 AM  Result Value Ref Range   Sodium 138 135 - 145 mmol/L   Potassium 2.9 (L) 3.5 - 5.1 mmol/L   Chloride 101 98 - 111 mmol/L   CO2 28 22 - 32 mmol/L   Glucose, Bld 142 (H) 70 - 99 mg/dL   BUN <5 (L) 6 - 20  mg/dL   Creatinine, Ser 6.04 0.44 - 1.00 mg/dL   Calcium 6.4 (LL) 8.9 - 10.3 mg/dL   GFR, Estimated >54 >09 mL/min   Anion gap 9 5 - 15  CBC     Status: Abnormal   Collection Time: 08/29/22  1:06 AM  Result Value Ref Range   WBC 5.5 4.0 - 10.5 K/uL   RBC 3.46 (L) 3.87 - 5.11 MIL/uL   Hemoglobin 9.9 (L) 12.0 - 15.0 g/dL   HCT 81.1 (L) 91.4 - 78.2 %   MCV 88.7 80.0 - 100.0 fL   MCH 28.6 26.0 - 34.0 pg   MCHC 32.2 30.0 - 36.0 g/dL   RDW 95.6 (H) 21.3 - 08.6 %   Platelets 244 150 - 400 K/uL   nRBC 0.0 0.0 - 0.2 %   Family Communication: Patient at bedside  Disposition: Status is: Inpatient Remains inpatient appropriate because: She has ongoing need for IV fluid repletion and electrolyte repletion.   Planned Discharge Destination: Home   Time spent: 33 minutes  Author: Reva Bores, MD 08/29/2022 12:22 PM  For on call review [email or web address].

## 2022-08-29 NOTE — Assessment & Plan Note (Signed)
Replete folate

## 2022-08-29 NOTE — Assessment & Plan Note (Signed)
Continue Norvasc

## 2022-08-29 NOTE — Assessment & Plan Note (Signed)
Exacerbated by ongoing diarrhea Proctofoam

## 2022-08-29 NOTE — Evaluation (Signed)
Physical Therapy Evaluation Patient Details Name: Shannon [name] MRN: [id number] DOB: 06-20-1965 Today's Date: 08/29/2022  History of Present Illness  57 y.o. female with past medical history significant for but not limited to: pernicious anemia and iron deficiency anemia, anxiety, lumbar degenerative disc disease, hyperlipidemia, hypertension, substance abuse, morbid obesity, osteopenia, PUD, H. pylori, thiamine deficiency, and vitamin D deficiency.  Patient reports that she has had several episodes of falling at least 12 at home which are preceded by lower extremity weakness, and some tremors.  Pt admitted with falls at home, chronic falls at home - Current fall is likely precipitated/exacerbated by patient having electrolyte abnormalities.  Clinical Impression  Pt admitted with above diagnosis.  Pt currently with functional limitations due to the deficits listed below (see PT Problem List). Pt will benefit from acute skilled PT to increase their independence and safety with mobility to allow discharge.  Pt reports numerous falls at home due to LEs buckling.  Pt lives alone in second floor apt and is currently in process of packing. Pt plans on moving and has flight booked to [place] in August.  Pt reports she needs to be able "to get on the plane."  Pt reports she is familiar with PT and OT from past services.  Pt hopeful she can return home however with current mobility, living alone, and frequent falls, recommend post acute rehab at this time.           Assistance Recommended at Discharge Intermittent Supervision/Assistance  If plan is discharge home, recommend the following:  Can travel by private vehicle  A lot of help with walking and/or transfers;Assist for transportation;Help with stairs or ramp for entrance   No    Equipment Recommendations Rolling walker (2 wheels)  Recommendations for Other Services       Functional Status Assessment Patient has had a recent decline in their  functional status and demonstrates the ability to make significant improvements in function in a reasonable and predictable amount of time.     Precautions / Restrictions Precautions Precautions: Fall      Mobility  Bed Mobility Overal bed mobility: Needs Assistance Bed Mobility: Rolling, Sidelying to Sit Rolling: Supervision Sidelying to sit: Supervision       General bed mobility comments: increased time and effort, pt reports similar to her performance at home    Transfers Overall transfer level: Needs assistance Equipment used: Rolling walker (2 wheels) Transfers: Sit to/from Stand Sit to Stand: Min assist           General transfer comment: cues for hand positioning however pt prefers to pull up with RW pushing back into bed/chair surface    Ambulation/Gait Ambulation/Gait assistance: Mod assist, +2 safety/equipment Gait Distance (Feet): 15 Feet Assistive device: Rolling walker (2 wheels) Gait Pattern/deviations: Step-to pattern, Decreased stance time - left, Decreased weight shift to left, Antalgic, Knees buckling       General Gait Details: pt reports L LE feeling weaker then R LE, buckling of bil LEs required mod assist and controlled descent to recliner after 15 feet  Stairs            Wheelchair Mobility     Tilt Bed    Modified Rankin (Stroke Patients Only)       Balance Overall balance assessment: History of Falls  Pertinent Vitals/Pain Pain Assessment Pain Assessment: No/denies pain    Home Living Family/patient expects to be discharged to:: Private residence Living Arrangements: Alone   Type of Home: Apartment Home Access: Stairs to enter Entrance Stairs-Rails: Left Entrance Stairs-Number of Steps: flight     Home Equipment: Rollator (4 wheels) Additional Comments: pt reports she is moving back to [place] in August and has to be able to get on her flight/plane     Prior Function Prior Level of Function : Independent/Modified Independent             Mobility Comments: using rollator, has had multiple falls prior to admission ADLs Comments: has ex husband assist with groceries or orders to be delivered     Hand Dominance        Extremity/Trunk Assessment        Lower Extremity Assessment Lower Extremity Assessment: Generalized weakness    Cervical / Trunk Assessment Cervical / Trunk Assessment: Normal  Communication   Communication: No difficulties  Cognition Arousal/Alertness: Awake/alert Behavior During Therapy: WFL for tasks assessed/performed Overall Cognitive Status: Within Functional Limits for tasks assessed                                          General Comments      Exercises     Assessment/Plan    PT Assessment Patient needs continued PT services  PT Problem List Decreased strength;Decreased balance;Decreased mobility;Decreased knowledge of use of DME;Decreased activity tolerance       PT Treatment Interventions DME instruction;Gait training;Balance training;Therapeutic exercise;Functional mobility training;Patient/family education;Therapeutic activities;Wheelchair mobility training    PT Goals (Current goals can be found in the Care Plan section)  Acute Rehab PT Goals PT Goal Formulation: With patient Time For Goal Achievement: 09/12/22 Potential to Achieve Goals: Good    Frequency Min 1X/week     Co-evaluation               AM-PAC PT "6 Clicks" Mobility  Outcome Measure Help needed turning from your back to your side while in a flat bed without using bedrails?: A Little Help needed moving from lying on your back to sitting on the side of a flat bed without using bedrails?: A Little Help needed moving to and from a bed to a chair (including a wheelchair)?: A Little Help needed standing up from a chair using your arms (e.g., wheelchair or bedside chair)?: A Little Help needed  to walk in hospital room?: A Lot Help needed climbing 3-5 steps with a railing? : Total 6 Click Score: 15    End of Session Equipment Utilized During Treatment: Gait belt Activity Tolerance: Patient tolerated treatment well Patient left: in chair;with call bell/phone within reach;with chair alarm set Nurse Communication: Mobility status PT Visit Diagnosis: Difficulty in walking, not elsewhere classified (R26.2);Repeated falls (R29.6)    Time: 1610-9604 PT Time Calculation (min) (ACUTE ONLY): 20 min   Charges:   PT Evaluation $PT Eval Low Complexity: 1 Low   PT General Charges $$ ACUTE PT VISIT: 1 Visit    Thomasene Mohair PT, DPT Physical Therapist Acute Rehabilitation Services Office: [phone]   Janan Halter Payson 08/29/2022, 12:41 PM

## 2022-08-29 NOTE — Assessment & Plan Note (Signed)
-  Continue Lipitor °

## 2022-08-29 NOTE — Hospital Course (Signed)
Shannon [name] is a 57 y.o. female with medical history significant of obesity and other medical disorders as listed below.  Patient reports that she has had several episodes of falling at least 12 at home which are preceded by lower extremity weakness, and some tremors.  Subsequently after falling, patient is unable to get back home and has had to have EMS come help her out.   Patient reports that 4 weeks ago she was in her usual state of health when she had a new onset of diarrhea that lasted till about 5 days ago.  At the time patient took some Imodium and patient has had no more diarrhea since then no vomiting no fever no abdominal pain.  Last evening at approximately 3 AM patient was getting up to use the bathroom when she had another episode of weakness/slight trembling of bilateral lower extremities and falling on the floor.  Patient was unable to get back up.  Had to crawl to the phone, talk to her PCP, subsequently talked to her EMS provider and come to the ER.  Patient reports that the EMS found the patient on the floor.  Patient was helped by the EMS and brought to [facility] ER.  Admitted, IV fluid resuscitation, electrolyte repletion.  Patient developed acute diarrheal symptoms again on admission after holding Imodium for a few days.

## 2022-08-29 NOTE — Assessment & Plan Note (Signed)
Klonopin 

## 2022-08-29 NOTE — Plan of Care (Signed)

## 2022-08-29 NOTE — Assessment & Plan Note (Signed)
Continue Seroquel Klonopin Neurontin

## 2022-08-29 NOTE — Assessment & Plan Note (Signed)
Replete and trend 

## 2022-08-30 ENCOUNTER — Inpatient Hospital Stay (HOSPITAL_COMMUNITY): Payer: Medicare Other

## 2022-08-30 DIAGNOSIS — A0472 Enterocolitis due to Clostridium difficile, not specified as recurrent: Secondary | ICD-10-CM | POA: Diagnosis present

## 2022-08-30 LAB — BASIC METABOLIC PANEL
Anion gap: 5 (ref 5–15)
BUN: <5 mg/dL — ABNORMAL LOW (ref 6–20)
CO2: 28 mmol/L (ref 22–32)
Calcium: 7.2 mg/dL — ABNORMAL LOW (ref 8.9–10.3)
Chloride: 106 mmol/L (ref 98–111)
Creatinine, Ser: 0.68 mg/dL (ref 0.44–1.00)
GFR, Estimated: >60 mL/min (ref >60)
Glucose, Bld: 88 mg/dL (ref 70–99)
Potassium: 4.4 mmol/L (ref 3.5–5.1)
Sodium: 139 mmol/L (ref 135–145)

## 2022-08-30 LAB — GASTROINTESTINAL PANEL BY PCR, STOOL (REPLACES STOOL CULTURE)

## 2022-08-30 LAB — CBC WITH DIFFERENTIAL/PLATELET
Abs Immature Granulocytes: 0.02 10*3/uL (ref 0.00–0.07)
Basophils Absolute: 0 10*3/uL (ref 0.0–0.1)
Basophils Relative: 0 %
Eosinophils Absolute: 0.1 10*3/uL (ref 0.0–0.5)
Eosinophils Relative: 2 %
HCT: 32 % — ABNORMAL LOW (ref 36.0–46.0)
Hemoglobin: 10.2 g/dL — ABNORMAL LOW (ref 12.0–15.0)
Immature Granulocytes: 0 %
Lymphocytes Relative: 36 %
Lymphs Abs: 1.9 10*3/uL (ref 0.7–4.0)
MCH: 28.9 pg (ref 26.0–34.0)
MCHC: 31.9 g/dL (ref 30.0–36.0)
MCV: 90.7 fL (ref 80.0–100.0)
Monocytes Absolute: 0.5 10*3/uL (ref 0.1–1.0)
Monocytes Relative: 9 %
Neutro Abs: 2.7 10*3/uL (ref 1.7–7.7)
Neutrophils Relative %: 53 %
Platelets: 251 10*3/uL (ref 150–400)
RBC: 3.53 MIL/uL — ABNORMAL LOW (ref 3.87–5.11)
RDW: 17.6 % — ABNORMAL HIGH (ref 11.5–15.5)
WBC: 5.2 10*3/uL (ref 4.0–10.5)
nRBC: 0 % (ref 0.0–0.2)

## 2022-08-30 MED ORDER — FIDAXOMICIN 200 MG PO TABS
200.0000 mg | ORAL_TABLET | Freq: Two times a day (BID) | ORAL | Status: DC
  Administered 2022-08-30 – 2022-09-02 (×7): 200 mg via ORAL
  Filled 2022-08-30 (×8): qty 1

## 2022-08-30 MED ORDER — GADOBUTROL 1 MMOL/ML IV SOLN
10.0000 mL | Freq: Once | INTRAVENOUS | Status: AC | PRN
  Administered 2022-08-30: 10 mL via INTRAVENOUS

## 2022-08-30 NOTE — Progress Notes (Signed)
Tele monitor dcd per order. Cleaned and returned to the front.

## 2022-08-30 NOTE — Assessment & Plan Note (Signed)
Suspected, patient has negative CT and no elevated white count. Start fidaxomicin

## 2022-08-30 NOTE — Plan of Care (Signed)
  Problem: Education: Goal: Knowledge of General Education information will improve Description: Including pain rating scale, medication(s)/side effects and non-pharmacologic comfort measures Outcome: Progressing   Problem: Coping: Goal: Level of anxiety will decrease Outcome: Progressing   Problem: Safety: Goal: Ability to remain free from injury will improve Outcome: Progressing   

## 2022-08-30 NOTE — Progress Notes (Signed)
Progress Note   Patient: Shannon [name] WUJ:[id number] DOB: [birth year] DOA: 08/28/2022     2 DOS: the patient was seen and examined on 08/30/2022   Brief hospital course: TYLER TRIGUEROS is a 57 y.o. female with medical history significant of obesity and other medical disorders as listed below.  Patient reports that she has had several episodes of falling at least 12 at home which are preceded by lower extremity weakness, and some tremors.  Subsequently after falling, patient is unable to get back home and has had to have EMS come help her out.   Patient reports that 4 weeks ago she was in her usual state of health when she had a new onset of diarrhea that lasted till about 5 days ago.  At the time patient took some Imodium and patient has had no more diarrhea since then no vomiting no fever no abdominal pain.  Last evening at approximately 3 AM patient was getting up to use the bathroom when she had another episode of weakness/slight trembling of bilateral lower extremities and falling on the floor.  Patient was unable to get back up.  Had to crawl to the phone, talk to her PCP, subsequently talked to her EMS provider and come to the ER.  Patient reports that the EMS found the patient on the floor.  Patient was helped by the EMS and brought to [facility] ER.  Admitted, IV fluid resuscitation, electrolyte repletion.  Patient developed acute diarrheal symptoms again on admission after holding Imodium for a few days.  Assessment and Plan: * Fall at home, initial encounter Has a pattern of chronic falls at home.  Current fall is likely precipitated/exacerbated by patient having electrolyte abnormalities.  Will replete and trend Per neurology needed MRI of the C-spine but pt has been unable to have this done due to inability to climb stairs Also would likely benefit from inpatient rehab and aggressive physical therapy B12 and folate deficient, replete Check a CK-within normal limits  C. difficile  colitis Suspected, patient has negative CT and no elevated white count. Start fidaxomicin  Hemorrhoid Exacerbated by ongoing diarrhea Proctofoam  Diarrhea Started about 4 weeks ago, resolved about 5 days ago after taking Imodium This has resumed.  Likely infectious and started after eating a [employer]. GI panel pending C. Difficile positive no prior history of antibiotic use Declines Imodium  Hypocalcemia With slight QTc prolongation - corrected today IV calcium gluconate   Folate deficiency Replete folate  GERD (gastroesophageal reflux disease) Continue PPI  GAD (generalized anxiety disorder) Klonopin  Mixed bipolar I disorder (HCC) Continue Seroquel and Neurontin  Hypokalemia Replete and trend  Essential hypertension Continue Norvasc  Hyperlipidemia with target LDL less than 130 Continue Lipitor    Subjective: Patient continues to have moderately heavy episodes of diarrhea.  This continues to cause problems with her hemorrhoids and anal and vaginal pressure.  The Proctofoam that we started yesterday helped some and will take a while to fully kick in.  She tested positive for C. difficile yesterday.  Physical Exam: Vitals:   08/29/22 0750 08/29/22 1347 08/29/22 2014 08/30/22 0506  BP: 133/79 (!) 140/74 132/73 127/75  Pulse: 94 99 87 91  Resp: 18 18 18 18   Temp: 98.5 F (36.9 C) 98 F (36.7 C) 98.6 F (37 C) 97.8 F (36.6 C)  TempSrc: Oral Oral Oral Oral  SpO2: 94% 95% 97% 95%  Weight:      Height:       Physical Examination:  General appearance - chronically ill appearing Chest -normal effort Heart - normal rate and regular rhythm Abdomen - soft, nontender, nondistended, no masses or organomegaly  Data Reviewed: Results for orders placed or performed during the hospital encounter of 08/28/22 (from the past 24 hour(s))  C Difficile Quick Screen w PCR reflex     Status: Abnormal   Collection Time: 08/29/22  4:26 PM   Specimen: STOOL  Result Value  Ref Range   C Diff antigen POSITIVE (A) NEGATIVE   C Diff toxin NEGATIVE NEGATIVE   C Diff interpretation Results are indeterminate. See PCR results.   C. Diff by PCR, Reflexed     Status: Abnormal   Collection Time: 08/29/22  4:26 PM  Result Value Ref Range   Toxigenic C. Difficile by PCR POSITIVE (A) NEGATIVE  Basic metabolic panel     Status: Abnormal   Collection Time: 08/30/22  6:45 AM  Result Value Ref Range   Sodium 139 135 - 145 mmol/L   Potassium 4.4 3.5 - 5.1 mmol/L   Chloride 106 98 - 111 mmol/L   CO2 28 22 - 32 mmol/L   Glucose, Bld 88 70 - 99 mg/dL   BUN <5 (L) 6 - 20 mg/dL   Creatinine, Ser 1.61 0.44 - 1.00 mg/dL   Calcium 7.2 (L) 8.9 - 10.3 mg/dL   GFR, Estimated >09 >60 mL/min   Anion gap 5 5 - 15  CBC with Differential/Platelet     Status: Abnormal   Collection Time: 08/30/22  6:45 AM  Result Value Ref Range   WBC 5.2 4.0 - 10.5 K/uL   RBC 3.53 (L) 3.87 - 5.11 MIL/uL   Hemoglobin 10.2 (L) 12.0 - 15.0 g/dL   HCT 45.4 (L) 09.8 - 11.9 %   MCV 90.7 80.0 - 100.0 fL   MCH 28.9 26.0 - 34.0 pg   MCHC 31.9 30.0 - 36.0 g/dL   RDW 14.7 (H) 82.9 - 56.2 %   Platelets 251 150 - 400 K/uL   nRBC 0.0 0.0 - 0.2 %   Neutrophils Relative % 53 %   Neutro Abs 2.7 1.7 - 7.7 K/uL   Lymphocytes Relative 36 %   Lymphs Abs 1.9 0.7 - 4.0 K/uL   Monocytes Relative 9 %   Monocytes Absolute 0.5 0.1 - 1.0 K/uL   Eosinophils Relative 2 %   Eosinophils Absolute 0.1 0.0 - 0.5 K/uL   Basophils Relative 0 %   Basophils Absolute 0.0 0.0 - 0.1 K/uL   Immature Granulocytes 0 %   Abs Immature Granulocytes 0.02 0.00 - 0.07 K/uL     Family Communication: Patient at bedside  Disposition: Status is: Inpatient Remains inpatient appropriate because: IV fluid resuscitation Planned Discharge Destination: Rehab DVT prophylaxis: Lovenox Time spent: 37 minutes  Author: Reva Bores, MD 08/30/2022 10:42 AM  For on call review [email or web address].

## 2022-08-31 ENCOUNTER — Other Ambulatory Visit (HOSPITAL_COMMUNITY): Payer: Self-pay

## 2022-08-31 DIAGNOSIS — M6281 Muscle weakness (generalized): Secondary | ICD-10-CM

## 2022-08-31 DIAGNOSIS — R251 Tremor, unspecified: Secondary | ICD-10-CM

## 2022-08-31 LAB — BASIC METABOLIC PANEL
Anion gap: 6 (ref 5–15)
BUN: 5 mg/dL — ABNORMAL LOW (ref 6–20)
CO2: 25 mmol/L (ref 22–32)
Calcium: 7.5 mg/dL — ABNORMAL LOW (ref 8.9–10.3)
Chloride: 110 mmol/L (ref 98–111)
Creatinine, Ser: 0.62 mg/dL (ref 0.44–1.00)
GFR, Estimated: >60 mL/min (ref >60)
Glucose, Bld: 105 mg/dL — ABNORMAL HIGH (ref 70–99)
Potassium: 4.9 mmol/L (ref 3.5–5.1)
Sodium: 141 mmol/L (ref 135–145)

## 2022-08-31 LAB — CALCIUM, IONIZED: Calcium, Ionized, Serum: 3.9 mg/dL — ABNORMAL LOW (ref 4.5–5.6)

## 2022-08-31 MED ORDER — MELATONIN 3 MG PO TABS
3.0000 mg | ORAL_TABLET | Freq: Every day | ORAL | Status: DC
  Administered 2022-08-31 – 2022-09-01 (×2): 3 mg via ORAL
  Filled 2022-08-31 (×3): qty 1

## 2022-08-31 MED ORDER — BARIATRIC MULTIVITAMINS/IRON PO CAPS: 1.0000 | ORAL_CAPSULE | Freq: Every day | ORAL | Status: DC

## 2022-08-31 MED ORDER — FLUOXETINE HCL 20 MG PO CAPS
20.0000 mg | ORAL_CAPSULE | Freq: Every day | ORAL | Status: DC
  Administered 2022-08-31 – 2022-09-01 (×2): 20 mg via ORAL
  Filled 2022-08-31 (×3): qty 1

## 2022-08-31 MED ORDER — HYDROCORTISONE ACETATE 25 MG RE SUPP
25.0000 mg | Freq: Two times a day (BID) | RECTAL | Status: DC | PRN
  Administered 2022-09-01: 25 mg via RECTAL
  Filled 2022-08-31 (×2): qty 1

## 2022-08-31 NOTE — Progress Notes (Signed)
PT Cancellation Note  Patient Details Name: TREMAYNE WERGIN MRN: [id number] DOB: 1965-10-25   Cancelled Treatment:     attempted to see twice today.  AM on/off BSC frequently then PM pt declined any OOB activity stating "I can't walk, I have Hemorrhoids and they are sticking out of me". Pt has been evaluated with rec for SNF.  Continue to follow.    Felecia Shelling  PTA Acute  Rehabilitation Services Office M-F          [phone]

## 2022-08-31 NOTE — Hospital Course (Addendum)
57 y.o. female with medical history significant of obesity, anemia, anxiety/bipolar disorder, chronic pain syndrome, degenerative disease, hypertension, hyperlipidemia, insomnia morbid obesity, neuropathy history of peptic ulcer disease, anemia GERD presented with several episodes of fall with generalized weakness tremos Subsequently after falling, patient was unable to get back home and has had to have EMS come help her out. She reports 4 weeks ago she was in her usual state of health when she had a new onset of diarrhea that lasted till about 5 days PTA,took some Imodium and patient has had no more diarrhea since then Around 3 AM patient was getting up to use the bathroom when she had another episode of weakness/slight trembling of bilateral lower extremities and falling on the floor.  Patient was unable to get back up.  Had to crawl to the phone, talk to her PCP, subsequently talked to her EMS provider and come to the ER.  Patient reports that the EMS found the patient on the floor.  Patient was helped by the EMS and brought to [facility] ER. In the ED, vitals stable afebrile Labs showed hypokalemia hypomagnesemia C Diff positive, she was admitted for further management

## 2022-08-31 NOTE — TOC Initial Note (Signed)
Transition of Care [facility]) - Initial/Assessment Note    Patient Details  Name: Shannon [name] MRN: [id number] Date of Birth: 15-Feb-1966  Transition of [facility]) CM/SW Contact:    Otelia Santee, LCSW Phone Number: 08/31/2022, 1:41 PM  Clinical Narrative:                 Met with pt at bedside to discuss recommendation for SNF. Pt shares she currently lives in an apartment on her own. Pt shares she will be travelling to [place] on 8/4 and wants to ensure she will be able to physically handle the travel. Pt is agreeable to going to SNF and does not currently have preference for facility as she is unfamiliar with SNF's.  Pt's PASRR currently under review.  Referral has been faxed out for SNF. Currently awaiting bed offers.  Expected Discharge Plan: Skilled Nursing Facility Barriers to Discharge: Continued Medical Work up, Awaiting State Approval Cherlyn Roberts), SNF Pending bed offer   Patient Goals and CMS Choice Patient states their goals for this hospitalization and ongoing recovery are:: To go to SNF CMS Medicare.gov Compare Post Acute Care list provided to:: Patient Choice offered to / list presented to : Patient [place] ownership interest in [facility].provided to:: Patient    Expected Discharge Plan and Services In-house Referral: Clinical Social Work Discharge Planning Services: NA Post Acute Care Choice: Skilled Nursing Facility Living arrangements for the past 2 months: Apartment                 DME Arranged: N/A DME Agency: NA                  Prior Living Arrangements/Services Living arrangements for the past 2 months: Apartment Lives with:: Self Patient language and need for interpreter reviewed:: Yes Do you feel safe going back to the place where you live?: Yes      Need for Family Participation in Patient Care: No (Comment) Care giver support system in place?: No (comment)   Criminal Activity/Legal Involvement Pertinent to Current  Situation/Hospitalization: No - Comment as needed  Activities of Daily Living Home Assistive Devices/Equipment: None ADL Screening (condition at time of admission) Patient's cognitive ability adequate to safely complete daily activities?: Yes Is the patient deaf or have difficulty hearing?: No Does the patient have difficulty seeing, even when wearing glasses/contacts?: No Does the patient have difficulty concentrating, remembering, or making decisions?: No Patient able to express need for assistance with ADLs?: Yes Does the patient have difficulty dressing or bathing?: No Independently performs ADLs?: Yes (appropriate for developmental age) Does the patient have difficulty walking or climbing stairs?: No Weakness of Legs: None Weakness of Arms/Hands: None  Permission Sought/Granted   Permission granted to share information with : Yes, Verbal Permission Granted  Share Information with NAME: Redmond Baseman and Shalela  Permission granted to share info w AGENCY: SNF's  Permission granted to share info w Relationship: Ex-husband and friend  Permission granted to share info w Contact Information: [phone] and [phone]  Emotional Assessment Appearance:: Appears stated age Attitude/Demeanor/Rapport: Engaged Affect (typically observed): Accepting Orientation: : Oriented to Self, Oriented to Place, Oriented to  Time, Oriented to Situation Alcohol / Substance Use: Not Applicable Psych Involvement: No (comment)  Admission diagnosis:  Hypocalcemia [E83.51] Patient Active Problem List   Diagnosis Date Noted   C. difficile colitis 08/30/2022   Hemorrhoid 08/29/2022   Bilateral leg weakness 08/28/2022   Hypocalcemia 08/28/2022   Fall at home, initial encounter 08/28/2022  Diarrhea 08/28/2022   Vitamin B12 deficiency 01/14/2022   Poor diet 01/14/2022   Folate deficiency 01/14/2022   Assistance needed with transportation 01/14/2022   Prediabetes 01/14/2022   Chronic abdominal  pain 12/31/2021   Hiatal hernia 12/31/2021   Blurred vision, bilateral 12/25/2021   Pins and needles sensation 12/25/2021   Dizziness 12/25/2021   Upper abdominal pain 12/25/2021   Unable to ambulate 12/25/2021   Osteopenia determined by x-ray 06/13/2021   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   Left hip pain 06/13/2021   Numbness and tingling of both lower extremities 06/10/2021   Laryngopharyngeal reflux 08/16/2020   Dysphagia 08/13/2020   Thrush 08/13/2020   Hoarseness 08/13/2020   Tachycardia 03/09/2020   Rash 03/09/2020   Neck mass 03/09/2020   Toe pain, bilateral 03/09/2020   Piriformis syndrome of both sides 09/02/2018   Routine general medical examination at a health care facility 12/22/2016   Sleep apnea 08/12/2016   Bilateral lower extremity edema 06/12/2015   Colon cancer screening 01/24/2015   GERD (gastroesophageal reflux disease) 12/26/2014   GAD (generalized anxiety disorder) 12/28/2013   Severe headache 12/20/2013   Abnormal vaginal Pap smear 10/25/2013   Thiamin deficiency 10/30/2012   Vitamin D deficiency 10/27/2012   Other abnormal glucose 10/26/2012   Mixed bipolar I disorder (HCC) 07/12/2012   Hypokalemia 09/01/2011   Insomnia 04/04/2011   Visit for screening mammogram 02/26/2011   Hx of laparoscopic gastric banding 09/03/2010   Tobacco abuse 06/09/2010   Hyperlipidemia with target LDL less than 130 01/25/2009   Morbid obesity (HCC) 01/25/2009   Iron deficiency anemia 01/25/2009   Essential hypertension 01/25/2009   Allergic rhinitis 01/25/2009   Degenerative disc disease, lumbar 01/25/2009   Backache 01/25/2009   Leiomyoma of uterus, unspecified 08/13/2008   Asthma 08/13/2008   PCP:  Avanell Shackleton, NP-C Pharmacy:   [facility] - [place], [place] - [address] [place] [address] Phone: [phone] Fax: [phone]     Social Determinants of Health (SDOH) Social History: SDOH  Screenings   Food Insecurity: No Food Insecurity (08/29/2022)  Housing: Low Risk  (08/29/2022)  Transportation Needs: Unmet Transportation Needs (08/29/2022)  Utilities: Not At Risk (08/29/2022)  Alcohol Screen: Low Risk  (10/07/2021)  Depression (PHQ2-9): Medium Risk (04/07/2022)  Financial Resource Strain: High Risk (01/23/2022)  Physical Activity: Inactive (04/07/2022)  Social Connections: Moderately Isolated (10/07/2021)  Stress: Stress Concern Present (04/07/2022)  Tobacco Use: High Risk (08/29/2022)   SDOH Interventions: Food Insecurity Interventions: Intervention Not Indicated Housing Interventions: Intervention Not Indicated Transportation Interventions: Payor Benefit, SCAT ([employer]), Other (Comment) Utilities Interventions: Intervention Not Indicated   Readmission Risk Interventions     No data to display

## 2022-08-31 NOTE — NC FL2 (Signed)
[place] MEDICAID FL2 LEVEL OF CARE FORM     IDENTIFICATION  Patient Name: Shannon [name] Birthdate: [birth year] Sex: female Admission Date (Current Location): 08/28/2022  [facility] and [place] Number:  Producer, television/film/video and Address:  [facility],  [address] [place], [place] [address]      Provider Number: [id number]  Attending Physician Name and Address:  Lanae Boast, MD  Relative Name and Phone Number:       Current Level of Care: Hospital Recommended Level of Care: Skilled Nursing Facility Prior Approval Number:    Date Approved/Denied:   PASRR Number: Pending  Discharge Plan: SNF    Current Diagnoses: Patient Active Problem List   Diagnosis Date Noted   C. difficile colitis 08/30/2022   Hemorrhoid 08/29/2022   Bilateral leg weakness 08/28/2022   Hypocalcemia 08/28/2022   Fall at home, initial encounter 08/28/2022   Diarrhea 08/28/2022   Vitamin B12 deficiency 01/14/2022   Poor diet 01/14/2022   Folate deficiency 01/14/2022   Assistance needed with transportation 01/14/2022   Prediabetes 01/14/2022   Chronic abdominal pain 12/31/2021   Hiatal hernia 12/31/2021   Blurred vision, bilateral 12/25/2021   Pins and needles sensation 12/25/2021   Dizziness 12/25/2021   Upper abdominal pain 12/25/2021   Unable to ambulate 12/25/2021   Osteopenia determined by x-ray 06/13/2021   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   Left hip pain 06/13/2021   Numbness and tingling of both lower extremities 06/10/2021   Laryngopharyngeal reflux 08/16/2020   Dysphagia 08/13/2020   Thrush 08/13/2020   Hoarseness 08/13/2020   Tachycardia 03/09/2020   Rash 03/09/2020   Neck mass 03/09/2020   Toe pain, bilateral 03/09/2020   Piriformis syndrome of both sides 09/02/2018   Routine general medical examination at a health care facility 12/22/2016   Sleep apnea 08/12/2016   Bilateral lower extremity edema 06/12/2015   Colon cancer screening 01/24/2015    GERD (gastroesophageal reflux disease) 12/26/2014   GAD (generalized anxiety disorder) 12/28/2013   Severe headache 12/20/2013   Abnormal vaginal Pap smear 10/25/2013   Thiamin deficiency 10/30/2012   Vitamin D deficiency 10/27/2012   Other abnormal glucose 10/26/2012   Mixed bipolar I disorder (HCC) 07/12/2012   Hypokalemia 09/01/2011   Insomnia 04/04/2011   Visit for screening mammogram 02/26/2011   Hx of laparoscopic gastric banding 09/03/2010   Tobacco abuse 06/09/2010   Hyperlipidemia with target LDL less than 130 01/25/2009   Morbid obesity (HCC) 01/25/2009   Iron deficiency anemia 01/25/2009   Essential hypertension 01/25/2009   Allergic rhinitis 01/25/2009   Degenerative disc disease, lumbar 01/25/2009   Backache 01/25/2009   Leiomyoma of uterus, unspecified 08/13/2008   Asthma 08/13/2008    Orientation RESPIRATION BLADDER Height & Weight     Self, Time, Situation, Place  Normal Continent Weight: 240 lb 12.8 oz (109.2 kg) Height:  5\' 4"  (162.6 cm)  BEHAVIORAL SYMPTOMS/MOOD NEUROLOGICAL BOWEL NUTRITION STATUS      Continent Diet (Regular)  AMBULATORY STATUS COMMUNICATION OF NEEDS Skin   Limited Assist Verbally Normal                       Personal Care Assistance Level of Assistance  Bathing, Feeding, Dressing Bathing Assistance: Independent Feeding assistance: Independent Dressing Assistance: Independent     Functional Limitations Info  Sight, Hearing, Speech Sight Info: Impaired Hearing Info: Adequate Speech Info: Adequate    SPECIAL CARE FACTORS FREQUENCY  PT (By licensed PT), OT (By licensed  OT)     PT Frequency: 5x/wk OT Frequency: 5x/wk            Contractures Contractures Info: Not present    Additional Factors Info  Code Status, Allergies, Psychotropic Code Status Info: FULL Allergies Info: Lamictal (Lamotrigine) Psychotropic Info: See MAR         Current Medications (08/31/2022):  This is the current hospital active medication  list Current Facility-Administered Medications  Medication Dose Route Frequency Provider Last Rate Last Admin   0.9 %  sodium chloride infusion   Intravenous Continuous Nolberto Hanlon, MD 75 mL/hr at 08/31/22 1254 New Bag at 08/31/22 1254   acetaminophen (TYLENOL) tablet 650 mg  650 mg Oral Q6H PRN Nolberto Hanlon, MD   650 mg at 08/31/22 0422   Or   acetaminophen (TYLENOL) suppository 650 mg  650 mg Rectal Q6H PRN Nolberto Hanlon, MD       amLODipine (NORVASC) tablet 5 mg  5 mg Oral QHS Nolberto Hanlon, MD   5 mg at 08/30/22 2132   atorvastatin (LIPITOR) tablet 20 mg  20 mg Oral Daily Nolberto Hanlon, MD   20 mg at 08/31/22 1057   clonazePAM (KLONOPIN) tablet 0.5 mg  0.5 mg Oral BID PRN Nolberto Hanlon, MD   0.5 mg at 08/29/22 2135   cyanocobalamin (VITAMIN B12) tablet 2,000 mcg  2,000 mcg Oral Daily Nolberto Hanlon, MD   2,000 mcg at 08/31/22 1057   enoxaparin (LOVENOX) injection 40 mg  40 mg Subcutaneous Q24H Nolberto Hanlon, MD   40 mg at 08/31/22 0816   fentaNYL (SUBLIMAZE) injection 12.5 mcg  12.5 mcg Intravenous Q1H PRN Reva Bores, MD       fidaxomicin (DIFICID) tablet 200 mg  200 mg Oral BID Reva Bores, MD   200 mg at 08/31/22 1057   folic acid (FOLVITE) tablet 1 mg  1 mg Oral Daily Nolberto Hanlon, MD   1 mg at 08/31/22 1057   gabapentin (NEURONTIN) capsule 300 mg  300 mg Oral BID Nolberto Hanlon, MD   300 mg at 08/31/22 1056   hydrocortisone-pramoxine (PROCTOFOAM-HC) rectal foam 1 applicator  1 applicator Rectal BID Reva Bores, MD   1 applicator at 08/31/22 1252   magnesium oxide (MAG-OX) tablet 400 mg  400 mg Oral BID Nolberto Hanlon, MD   400 mg at 08/31/22 1057   pantoprazole (PROTONIX) EC tablet 40 mg  40 mg Oral BID Thalia Party, MD   40 mg at 08/31/22 0815   polyethylene glycol (MIRALAX / GLYCOLAX) packet 17 g  17 g Oral Daily PRN Nolberto Hanlon, MD       potassium chloride SA (KLOR-CON M) CR tablet 40 mEq  40 mEq Oral BID Reva Bores, MD   40 mEq at 08/31/22 1056   QUEtiapine (SEROQUEL) tablet 25 mg  25 mg  Oral Daily Nolberto Hanlon, MD   25 mg at 08/31/22 1057   QUEtiapine (SEROQUEL) tablet 300 mg  300 mg Oral QHS Nolberto Hanlon, MD   300 mg at 08/30/22 2131   sodium chloride flush (NS) 0.9 % injection 3 mL  3 mL Intravenous Q12H Nolberto Hanlon, MD   3 mL at 08/30/22 2132   traMADol (ULTRAM) tablet 50 mg  50 mg Oral Q6H PRN Reva Bores, MD   50 mg at 08/31/22 4540     Discharge Medications: Please see discharge summary for a list of discharge medications.  Relevant Imaging Results:  Relevant Lab Results:   Additional Information  SSN: [id number]  Otelia Santee, LCSW

## 2022-08-31 NOTE — TOC Benefit Eligibility Note (Signed)
Pharmacy Patient Advocate Encounter  Insurance verification completed.    The patient is insured through [facility]  Ran test claim for Vancomycin 125mg  and the current 10 day co-pay is $4.50.  Ran test claim for Dificid 200mg   and the current 10 day co-pay is $11.20.   This test claim was processed through [facility]- copay amounts may vary at other pharmacies due to pharmacy/plan contracts, or as the patient moves through the different stages of their insurance plan.

## 2022-08-31 NOTE — Progress Notes (Signed)
PROGRESS NOTE Shannon [name]  ZOX:[id number] DOB: 1965/07/29 DOA: 08/28/2022 PCP: Avanell Shackleton, NP-C  Brief Narrative/Hospital Course: 57 y.o. female with medical history significant of obesity, anemia, anxiety/bipolar disorder, chronic pain syndrome, degenerative disease, hypertension, hyperlipidemia, insomnia morbid obesity, neuropathy history of peptic ulcer disease, anemia GERD presented with several episodes of fall with generalized weakness tremos Subsequently after falling, patient was unable to get back home and has had to have EMS come help her out. She reports 4 weeks ago she was in her usual state of health when she had a new onset of diarrhea that lasted till about 5 days PTA,took some Imodium and patient has had no more diarrhea since then Around 3 AM patient was getting up to use the bathroom when she had another episode of weakness/slight trembling of bilateral lower extremities and falling on the floor.  Patient was unable to get back up.  Had to crawl to the phone, talk to her PCP, subsequently talked to her EMS provider and come to the ER.  Patient reports that the EMS found the patient on the floor.  Patient was helped by the EMS and brought to [facility] ER. In the ED, vitals stable afebrile Labs showed hypokalemia hypomagnesemia C Diff positive, she was admitted for further management    Subjective: Patient seen and examined this morning. Reports she had diarrhea about 4 times since 2:00 this morning, he still feels generalized weakness and leg Complaints of numbness on lower extremities since October of last year Good strength in upper arm.  Assessment and Plan: Principal Problem:   Fall at home, initial encounter Active Problems:   Hyperlipidemia with target LDL less than 130   Essential hypertension   Hypokalemia   Mixed bipolar I disorder (HCC)   GAD (generalized anxiety disorder)   GERD (gastroesophageal reflux disease)   Folate deficiency   Hypocalcemia    Diarrhea   Hemorrhoid   C. difficile colitis   Fall at home Deconditioning/debility and weakness: Likely in the setting of electrolyte imbalance deconditioning She had MRI on 7/5 for lumbar spine showing DJD.  MRI done for cervical spine on 7/7 unremarkable.  PT OT advised skilled nursing facility continue to pursue PRN.  C. difficile colitis/diarrhea: CT on pelvis on 7/5 fatty liver infiltration, no other acute finding, continue fidaxomicin, enteric precaution, and supportive care  Hyponatremia due to diarrhea resolved.  Hemorrhoid worsened by her diarrhea, Proctofoam, supportive care  Hypocalcemia in the setting of diarrhea monitor  Folate deficiency continue to replete  GERD:continue PPI  BPD GAD: continue Klonopin, Seroquel and Neurontin  Hypertension: Stable, Continue Norvasc  HLD continue Lipitor  Sub 5 mm lung nodules at the lung bases recommended 1 to follow-up  Morbid Obesity:Patient's Body mass index is 41.33 kg/m. : Will benefit with PCP follow-up, weight loss  healthy lifestyle and outpatient sleep evaluation.  DVT prophylaxis: enoxaparin (LOVENOX) injection 40 mg Start: 08/29/22 0800 SCDs Start: 08/28/22 2100 Code Status:   Code Status: Full Code Family Communication: plan of care discussed with patient/ at bedside. Patient status is: Inpatient because of diarrhea Level of care: Telemetry   Dispo: The patient is from: home            Anticipated disposition: SNF based on PT OT recommendations once diarrhea better Objective: Vitals last 24 hrs: Vitals:   08/30/22 0506 08/30/22 1223 08/30/22 2022 08/31/22 0425  BP: 127/75 (!) 148/82 121/63 (!) 140/82  Pulse: 91 95 89 94  Resp: 18 18 18  18  Temp: 97.8 F (36.6 C) 98.2 F (36.8 C) 97.9 F (36.6 C) 98.4 F (36.9 C)  TempSrc: Oral Oral Oral Oral  SpO2: 95% 94% 92% 97%  Weight:      Height:       Weight change:   Physical Examination: General exam: alert awake, older than stated age HEENT:Oral  mucosa moist, Ear/Nose WNL grossly Respiratory system: bilaterally clear BS, no use of accessory muscle Cardiovascular system: S1 & S2 +, No JVD. Gastrointestinal system: Abdomen soft,NT,ND, BS+ Nervous System:Alert, awake, moving all her extremities. Extremities: LE edema neg,distal peripheral pulses palpable.  Skin: No rashes,no icterus. MSK: Normal muscle bulk,tone, power  Medications reviewed:  Scheduled Meds:  amLODipine  5 mg Oral QHS   atorvastatin  20 mg Oral Daily   vitamin B-12  2,000 mcg Oral Daily   enoxaparin (LOVENOX) injection  40 mg Subcutaneous Q24H   fidaxomicin  200 mg Oral BID   folic acid  1 mg Oral Daily   gabapentin  300 mg Oral BID   hydrocortisone-pramoxine  1 applicator Rectal BID   magnesium oxide  400 mg Oral BID   pantoprazole  40 mg Oral BID AC   potassium chloride  40 mEq Oral BID   QUEtiapine  25 mg Oral Daily   QUEtiapine  300 mg Oral QHS   sodium chloride flush  3 mL Intravenous Q12H  Continuous Infusions:  sodium chloride 75 mL/hr at 08/31/22 0200    Diet Order             Diet 2 gram sodium Room service appropriate? Yes; Fluid consistency: Thin  Diet effective now                  Intake/Output Summary (Last 24 hours) at 08/31/2022 1214 Last data filed at 08/31/2022 1129 Gross per 24 hour  Intake 2773 ml  Output --  Net 2773 ml   Net IO Since Admission: 5,747.59 mL [08/31/22 1214]  Wt Readings from Last 3 Encounters:  08/28/22 109.2 kg  08/13/22 97.5 kg  05/18/22 101.2 kg     Unresulted Labs (From admission, onward)     Start     Ordered   09/04/22 0500  Creatinine, serum  (enoxaparin (LOVENOX)    CrCl >/= 30 ml/min)  Weekly,   R     Comments: while on enoxaparin therapy    08/28/22 2059   08/29/22 1000  Ferritin  Once,   AD        08/29/22 1000          Data Reviewed: I have personally reviewed following labs and imaging studies CBC: Recent Labs  Lab 08/28/22 1350 08/29/22 0106 08/30/22 0645  WBC 6.9 5.5 5.2   NEUTROABS 4.4  --  2.7  HGB 11.1* 9.9* 10.2*  HCT 33.1* 30.7* 32.0*  MCV 86.0 88.7 90.7  PLT 308 244 251   Basic Metabolic Panel: Recent Labs  Lab 08/28/22 1350 08/28/22 1358 08/29/22 0106 08/30/22 0645 08/31/22 1042  NA  --  137 138 139 141  K  --  3.1* 2.9* 4.4 4.9  CL  --  97* 101 106 110  CO2  --  31 28 28 25   GLUCOSE  --  95 142* 88 105*  BUN  --  <5* <5* <5* 5*  CREATININE  --  0.89 0.84 0.68 0.62  CALCIUM  --  6.3* 6.4* 7.2* 7.5*  MG  --  1.4* 1.8  --   --   PHOS  3.2  --   --   --   --    GFR: Estimated Creatinine Clearance: 92.6 mL/min (by C-G formula based on SCr of 0.62 mg/dL). Liver Function Tests: Recent Labs  Lab 08/28/22 1358  AST 21  ALT 17  ALKPHOS 131*  BILITOT 0.8  PROT 6.5  ALBUMIN 2.2*   Recent Labs  Lab 08/28/22 1350  LIPASE 19   Recent Labs  Lab 08/29/22 0106  INR 1.0   Recent Results (from the past 240 hour(s))  Gastrointestinal Panel by PCR , Stool     Status: None   Collection Time: 08/29/22  4:26 PM   Specimen: STOOL  Result Value Ref Range Status   Campylobacter species NOT DETECTED NOT DETECTED Final   Plesimonas shigelloides NOT DETECTED NOT DETECTED Final   Salmonella species NOT DETECTED NOT DETECTED Final   Yersinia enterocolitica NOT DETECTED NOT DETECTED Final   Vibrio species NOT DETECTED NOT DETECTED Final   Vibrio cholerae NOT DETECTED NOT DETECTED Final   Enteroaggregative E coli (EAEC) NOT DETECTED NOT DETECTED Final   Enteropathogenic E coli (EPEC) NOT DETECTED NOT DETECTED Final   Enterotoxigenic E coli (ETEC) NOT DETECTED NOT DETECTED Final   Shiga like toxin producing E coli (STEC) NOT DETECTED NOT DETECTED Final   Shigella/Enteroinvasive E coli (EIEC) NOT DETECTED NOT DETECTED Final   Cryptosporidium NOT DETECTED NOT DETECTED Final   Cyclospora cayetanensis NOT DETECTED NOT DETECTED Final   Entamoeba histolytica NOT DETECTED NOT DETECTED Final   Giardia lamblia NOT DETECTED NOT DETECTED Final    Adenovirus F40/41 NOT DETECTED NOT DETECTED Final   Astrovirus NOT DETECTED NOT DETECTED Final   Norovirus GI/GII NOT DETECTED NOT DETECTED Final   Rotavirus A NOT DETECTED NOT DETECTED Final   Sapovirus (I, II, IV, and V) NOT DETECTED NOT DETECTED Final    Comment: Performed at [facility], [address] [place], [place] [address]  C Difficile Quick Screen w PCR reflex     Status: Abnormal   Collection Time: 08/29/22  4:26 PM   Specimen: STOOL  Result Value Ref Range Status   C Diff antigen POSITIVE (A) NEGATIVE Final   C Diff toxin NEGATIVE NEGATIVE Final   C Diff interpretation Results are indeterminate. See PCR results.  Final    Comment: Performed at [facility], [address], [place], [place] [address]  C. Diff by PCR, Reflexed     Status: Abnormal   Collection Time: 08/29/22  4:26 PM  Result Value Ref Range Status   Toxigenic C. Difficile by PCR POSITIVE (A) NEGATIVE Final    Comment: Positive for toxigenic C. difficile with little to no toxin production. Only treat if clinical presentation suggests symptomatic illness. Performed at [facility], [address] [place], [place] [address]     Antimicrobials: Anti-infectives (From admission, onward)    Start     Dose/Rate Route Frequency Ordered Stop   08/30/22 1000  fidaxomicin (DIFICID) tablet 200 mg        200 mg Oral 2 times daily 08/30/22 0754 09/09/22 0959      Culture/Microbiology    Component Value Date/Time   SDES NOSE 07/10/2010 1145   SPECREQUEST NONE 07/10/2010 1145   CULT NO STAPHYLOCOCCUS AUREUS ISOLATED 07/10/2010 1145   REPTSTATUS 07/13/2010 FINAL 07/10/2010 1145    Radiology Studies: MR CERVICAL SPINE W WO CONTRAST  Result Date: 08/30/2022 CLINICAL DATA:  Ataxia EXAM: MRI CERVICAL SPINE WITHOUT AND WITH CONTRAST TECHNIQUE:  Multiplanar and multiecho pulse sequences of the cervical spine, to include the craniocervical junction and cervicothoracic junction, were  obtained without and with intravenous contrast. CONTRAST:  10mL GADAVIST GADOBUTROL 1 MMOL/ML IV SOLN COMPARISON:  None Available. FINDINGS: Alignment: Physiologic. Vertebrae: No fracture, evidence of discitis, or bone lesion. Cord: Normal signal and morphology. Susceptibility artifacts from an unidentifiable metal object partially obscure the spinal canal on T1-weighted imaging. Posterior Fossa, vertebral arteries, paraspinal tissues: Negative. Disc levels: No spinal canal or neural foraminal stenosis. IMPRESSION: Unremarkable MRI of the cervical spine. Electronically Signed   By: Deatra Robinson M.D.   On: 08/30/2022 18:43     LOS: 3 days   Lanae Boast, MD [facility]  08/31/2022, 12:14 PM

## 2022-09-01 MED ORDER — POTASSIUM CHLORIDE CRYS ER 20 MEQ PO TBCR
20.0000 meq | EXTENDED_RELEASE_TABLET | Freq: Two times a day (BID) | ORAL | Status: AC
  Administered 2022-09-02: 20 meq via ORAL
  Filled 2022-09-01: qty 1

## 2022-09-01 NOTE — Progress Notes (Signed)
PROGRESS NOTE Shannon [name]  ZOX:[id number] DOB: 05/03/1965 DOA: 08/28/2022 PCP: Avanell Shackleton, NP-C  Brief Narrative/Hospital Course: 57 y.o. female with medical history significant of obesity, anemia, anxiety/bipolar disorder, chronic pain syndrome, degenerative disease, hypertension, hyperlipidemia, insomnia morbid obesity, neuropathy history of peptic ulcer disease, anemia GERD presented with several episodes of fall with generalized weakness tremos Subsequently after falling, patient was unable to get back home and has had to have EMS come help her out. She reports 4 weeks ago she was in her usual state of health when she had a new onset of diarrhea that lasted till about 5 days PTA,took some Imodium and patient has had no more diarrhea since then Around 3 AM patient was getting up to use the bathroom when she had another episode of weakness/slight trembling of bilateral lower extremities and falling on the floor.  Patient was unable to get back up.  Had to crawl to the phone, talk to her PCP, subsequently talked to her EMS provider and come to the ER.  Patient reports that the EMS found the patient on the floor.  Patient was helped by the EMS and brought to [facility] ER. In the ED, vitals stable afebrile Labs showed hypokalemia hypomagnesemia C Diff positive, she was admitted for further management    Subjective: Patient seen and examined this morning  Complains of ongoing diarrhea 3 overnight and 1 this morning.   Continue to work with PT OT yesterday and plan to see him this afternoon No new weakness or new complaint Reports hemorrhoid is bothering her due to frequent bowel movement and gets pressure sensation in the vaginal area too  Assessment and Plan: Principal Problem:   Fall at home, initial encounter Active Problems:   Hyperlipidemia with target LDL less than 130   Essential hypertension   Hypokalemia   Mixed bipolar I disorder (HCC)   GAD (generalized anxiety disorder)    GERD (gastroesophageal reflux disease)   Folate deficiency   Hypocalcemia   Diarrhea   Hemorrhoid   C. difficile colitis   Fall at home Deconditioning/debility and weakness: Likely in the setting of electrolyte imbalance deconditioning She had MRI on 7/5 for lumbar spine showing DJD.  MRI done for cervical spine on 7/7 unremarkable.  PT OT advised skilled nursing facility continue to pursue SNF.  Encourage PT OT  C. difficile colitis/diarrhea: CT on pelvis on 7/5 fatty liver infiltration, no other acute finding.  Having ongoing diarrhea,continue fidaxomicin, enteric precaution, and supportive care.  Hyponatremia due to diarrhea resolved.. dc ivf. Hypokalemia improved, change kdur to 20 x 1 In am.  Hemorrhoid worsened by her diarrhea, Proctofoam> changed to Anusol PR, supportive care  Hypocalcemia in the setting of diarrhea.  Improved.  Folate deficiency continue to replete  GERD:continue PPI  BPD GAD: continue Klonopin, Seroquel and Neurontin.  Her mood is stable  Hypertension: Stable blood pressure on Norvasc  HLD continue Lipitor  Sub 5 mm lung nodules at the lung bases recommended 1 to follow-up > please provide instructions at the time of discharge  Morbid Obesity:Patient's Body mass index is 41.33 kg/m. : Will benefit with PCP follow-up, weight loss  healthy lifestyle and outpatient sleep evaluation.  DVT prophylaxis: enoxaparin (LOVENOX) injection 40 mg Start: 08/29/22 0800 SCDs Start: 08/28/22 2100 Code Status:   Code Status: Full Code Family Communication: plan of care discussed with patient/ at bedside. Patient status is: Inpatient because of diarrhea Level of care: Telemetry   Dispo: The patient is from: home  Anticipated disposition: SNF based on PT OT recommendations once diarrhea better Objective: Vitals last 24 hrs: Vitals:   08/31/22 0425 08/31/22 1332 08/31/22 2318 09/01/22 0637  BP: (!) 140/82 (!) 177/87 (!) 143/85 (!) 148/93  Pulse: 94  100 (!) 108 97  Resp: 18 18 18 18   Temp: 98.4 F (36.9 C) 98.3 F (36.8 C) 98.4 F (36.9 C) 97.7 F (36.5 C)  TempSrc: Oral Oral Oral Oral  SpO2: 97% 97% 93% 95%  Weight:      Height:       Weight change:   Physical Examination: General exam: alert awake, oriented at baseline, obese, pleasant older than stated age HEENT:Oral mucosa moist, Ear/Nose WNL grossly Respiratory system: Bilaterally clear BS,no use of accessory muscle Cardiovascular system: S1 & S2 +, No JVD. Gastrointestinal system: Abdomen soft,NT,ND, BS+ Nervous System: Alert, awake, moving all extremities,and following commands. Extremities: LE edema neg,distal peripheral pulses palpable and warm.  Skin: No rashes,no icterus. MSK: Normal muscle bulk,tone, power   Medications reviewed:  Scheduled Meds:  amLODipine  5 mg Oral QHS   atorvastatin  20 mg Oral Daily   vitamin B-12  2,000 mcg Oral Daily   enoxaparin (LOVENOX) injection  40 mg Subcutaneous Q24H   fidaxomicin  200 mg Oral BID   FLUoxetine  20 mg Oral QHS   folic acid  1 mg Oral Daily   gabapentin  300 mg Oral BID   hydrocortisone-pramoxine  1 applicator Rectal BID   magnesium oxide  400 mg Oral BID   melatonin  3 mg Oral QHS   pantoprazole  40 mg Oral BID AC   potassium chloride  40 mEq Oral BID   QUEtiapine  25 mg Oral Daily   QUEtiapine  300 mg Oral QHS   sodium chloride flush  3 mL Intravenous Q12H  Continuous Infusions:  sodium chloride 75 mL/hr at 09/01/22 0355    Diet Order             Diet 2 gram sodium Room service appropriate? Yes; Fluid consistency: Thin  Diet effective now                  Intake/Output Summary (Last 24 hours) at 09/01/2022 1132 Last data filed at 09/01/2022 0708 Gross per 24 hour  Intake 2523.74 ml  Output 2 ml  Net 2521.74 ml    Net IO Since Admission: 8,269.33 mL [09/01/22 1132]  Wt Readings from Last 3 Encounters:  08/28/22 109.2 kg  08/13/22 97.5 kg  05/18/22 101.2 kg     Unresulted Labs (From  admission, onward)     Start     Ordered   09/04/22 0500  Creatinine, serum  (enoxaparin (LOVENOX)    CrCl >/= 30 ml/min)  Weekly,   R     Comments: while on enoxaparin therapy    08/28/22 2059          Data Reviewed: I have personally reviewed following labs and imaging studies CBC: Recent Labs  Lab 08/28/22 1350 08/29/22 0106 08/30/22 0645  WBC 6.9 5.5 5.2  NEUTROABS 4.4  --  2.7  HGB 11.1* 9.9* 10.2*  HCT 33.1* 30.7* 32.0*  MCV 86.0 88.7 90.7  PLT 308 244 251    Basic Metabolic Panel: Recent Labs  Lab 08/28/22 1350 08/28/22 1358 08/29/22 0106 08/30/22 0645 08/31/22 1042  NA  --  137 138 139 141  K  --  3.1* 2.9* 4.4 4.9  CL  --  97* 101 106 110  CO2  --  31 28 28 25   GLUCOSE  --  95 142* 88 105*  BUN  --  <5* <5* <5* 5*  CREATININE  --  0.89 0.84 0.68 0.62  CALCIUM  --  6.3* 6.4* 7.2* 7.5*  MG  --  1.4* 1.8  --   --   PHOS 3.2  --   --   --   --     GFR: Estimated Creatinine Clearance: 92.6 mL/min (by C-G formula based on SCr of 0.62 mg/dL). Liver Function Tests: Recent Labs  Lab 08/28/22 1358  AST 21  ALT 17  ALKPHOS 131*  BILITOT 0.8  PROT 6.5  ALBUMIN 2.2*    Recent Labs  Lab 08/28/22 1350  LIPASE 19    Recent Labs  Lab 08/29/22 0106  INR 1.0    Recent Results (from the past 240 hour(s))  Gastrointestinal Panel by PCR , Stool     Status: None   Collection Time: 08/29/22  4:26 PM   Specimen: STOOL  Result Value Ref Range Status   Campylobacter species NOT DETECTED NOT DETECTED Final   Plesimonas shigelloides NOT DETECTED NOT DETECTED Final   Salmonella species NOT DETECTED NOT DETECTED Final   Yersinia enterocolitica NOT DETECTED NOT DETECTED Final   Vibrio species NOT DETECTED NOT DETECTED Final   Vibrio cholerae NOT DETECTED NOT DETECTED Final   Enteroaggregative E coli (EAEC) NOT DETECTED NOT DETECTED Final   Enteropathogenic E coli (EPEC) NOT DETECTED NOT DETECTED Final   Enterotoxigenic E coli (ETEC) NOT DETECTED NOT  DETECTED Final   Shiga like toxin producing E coli (STEC) NOT DETECTED NOT DETECTED Final   Shigella/Enteroinvasive E coli (EIEC) NOT DETECTED NOT DETECTED Final   Cryptosporidium NOT DETECTED NOT DETECTED Final   Cyclospora cayetanensis NOT DETECTED NOT DETECTED Final   Entamoeba histolytica NOT DETECTED NOT DETECTED Final   Giardia lamblia NOT DETECTED NOT DETECTED Final   Adenovirus F40/41 NOT DETECTED NOT DETECTED Final   Astrovirus NOT DETECTED NOT DETECTED Final   Norovirus GI/GII NOT DETECTED NOT DETECTED Final   Rotavirus A NOT DETECTED NOT DETECTED Final   Sapovirus (I, II, IV, and V) NOT DETECTED NOT DETECTED Final    Comment: Performed at [facility], [address] [place], [place] [address]  C Difficile Quick Screen w PCR reflex     Status: Abnormal   Collection Time: 08/29/22  4:26 PM   Specimen: STOOL  Result Value Ref Range Status   C Diff antigen POSITIVE (A) NEGATIVE Final   C Diff toxin NEGATIVE NEGATIVE Final   C Diff interpretation Results are indeterminate. See PCR results.  Final    Comment: Performed at [facility], [address] [place], [place] [address]  C. Diff by PCR, Reflexed     Status: Abnormal   Collection Time: 08/29/22  4:26 PM  Result Value Ref Range Status   Toxigenic C. Difficile by PCR POSITIVE (A) NEGATIVE Final    Comment: Positive for toxigenic C. difficile with little to no toxin production. Only treat if clinical presentation suggests symptomatic illness. Performed at [facility], [address] [place], [place] [address]     Antimicrobials: Anti-infectives (From admission, onward)    Start     Dose/Rate Route Frequency Ordered Stop   08/30/22 1000  fidaxomicin (DIFICID) tablet 200 mg        200 mg Oral 2 times daily 08/30/22 0754 09/09/22 0959  Culture/Microbiology    Component Value Date/Time   SDES NOSE 07/10/2010 1145   SPECREQUEST NONE 07/10/2010 1145   CULT NO STAPHYLOCOCCUS  AUREUS ISOLATED 07/10/2010 1145   REPTSTATUS 07/13/2010 FINAL 07/10/2010 1145    Radiology Studies: MR CERVICAL SPINE W WO CONTRAST  Result Date: 08/30/2022 CLINICAL DATA:  Ataxia EXAM: MRI CERVICAL SPINE WITHOUT AND WITH CONTRAST TECHNIQUE: Multiplanar and multiecho pulse sequences of the cervical spine, to include the craniocervical junction and cervicothoracic junction, were obtained without and with intravenous contrast. CONTRAST:  10mL GADAVIST GADOBUTROL 1 MMOL/ML IV SOLN COMPARISON:  None Available. FINDINGS: Alignment: Physiologic. Vertebrae: No fracture, evidence of discitis, or bone lesion. Cord: Normal signal and morphology. Susceptibility artifacts from an unidentifiable metal object partially obscure the spinal canal on T1-weighted imaging. Posterior Fossa, vertebral arteries, paraspinal tissues: Negative. Disc levels: No spinal canal or neural foraminal stenosis. IMPRESSION: Unremarkable MRI of the cervical spine. Electronically Signed   By: Deatra Robinson M.D.   On: 08/30/2022 18:43     LOS: 4 days   Lanae Boast, MD [facility]  09/01/2022, 11:32 AM

## 2022-09-01 NOTE — Progress Notes (Signed)
30 Day PASRR Note   Patient Details  Name: Shannon [name] Date of Birth: May 01, 1965   Transition of Care [facility]) CM/SW Contact:    Otelia Santee, LCSW Phone Number: 09/01/2022, 9:44 AM  To Whom It May Concern:  Please be advised that this patient will require a short-term nursing home stay - anticipated 30 days or less for rehabilitation and strengthening.   The plan is for return home.

## 2022-09-01 NOTE — Progress Notes (Signed)
Mobility Specialist Cancellation/Refusal Note:   Reason for Cancellation/Refusal: Pt declined mobility at this time. Stated she changed her mind and did not want to do any exercises or get OOB. When trying to encourage her stated "I'll walk once I'm out of here, goodbye." Will check back as schedule permits.   Billey Chang Mobility Specialist

## 2022-09-01 NOTE — Progress Notes (Signed)
Mobility Specialist - Progress Note   09/01/22 0942  Mobility  Activity Stood at bedside;Transferred to/from [facility]  Level of Assistance Standby assist, set-up cues, supervision of patient - no hands on  Range of Motion/Exercises Active  Activity Response Tolerated fair  $Mobility charge 1 Mobility  Mobility Specialist Start Time (ACUTE ONLY) 0935  Mobility Specialist Stop Time (ACUTE ONLY) [id number]  Mobility Specialist Time Calculation (min) (ACUTE ONLY) 7 min   Pt was found standing at bedside wanting assistance. Pt able to pivot to [facility] with supervision. Was left on [facility] with call bell in reach. Requesting for afternoon session for exercises.  Billey Chang Mobility Specialist

## 2022-09-01 NOTE — TOC Progression Note (Signed)
Transition of Care [facility]) - Progression Note    Patient Details  Name: Shannon [name] MRN: [id number] Date of Birth: [birth year]  Transition of Care [facility]) CM/SW Contact  Otelia Santee, LCSW Phone Number: 09/01/2022, 2:24 PM  Clinical Narrative:    Pt's PASRR returned: [phone] E Reviewed bed offers with pt. Pt accepted bed at [facility].  Have requested update PT note. Will request insurance auth once PT note in.    Expected Discharge Plan: Skilled Nursing Facility Barriers to Discharge: Continued Medical Work up, Awaiting State Approval Cherlyn Roberts), SNF Pending bed offer  Expected Discharge Plan and Services In-house Referral: Clinical Social Work Discharge Planning Services: NA Post Acute Care Choice: Skilled Nursing Facility Living arrangements for the past 2 months: Apartment                 DME Arranged: N/A DME Agency: NA                   Social Determinants of Health (SDOH) Interventions SDOH Screenings   Food Insecurity: No Food Insecurity (08/29/2022)  Housing: Low Risk  (08/29/2022)  Transportation Needs: Unmet Transportation Needs (08/29/2022)  Utilities: Not At Risk (08/29/2022)  Alcohol Screen: Low Risk  (10/07/2021)  Depression (PHQ2-9): Medium Risk (04/07/2022)  Financial Resource Strain: High Risk (01/23/2022)  Physical Activity: Inactive (04/07/2022)  Social Connections: Moderately Isolated (10/07/2021)  Stress: Stress Concern Present (04/07/2022)  Tobacco Use: High Risk (08/29/2022)    Readmission Risk Interventions     No data to display

## 2022-09-02 DIAGNOSIS — A0472 Enterocolitis due to Clostridium difficile, not specified as recurrent: Secondary | ICD-10-CM

## 2022-09-02 DIAGNOSIS — Y92009 Unspecified place in unspecified non-institutional (private) residence as the place of occurrence of the external cause: Secondary | ICD-10-CM | POA: Diagnosis not present

## 2022-09-02 DIAGNOSIS — W19XXXA Unspecified fall, initial encounter: Secondary | ICD-10-CM | POA: Diagnosis not present

## 2022-09-02 NOTE — TOC Progression Note (Signed)
Transition of Care [facility]) - Progression Note    Patient Details  Name: Shannon [name] MRN: [id number] Date of Birth: 08-27-1965  Transition of Care [facility]) CM/SW Contact  Otelia Santee, LCSW Phone Number: 09/02/2022, 1:17 PM  Clinical Narrative:    Insurance Berkley Harvey has been requested for SNF. Currently pending approval.    Expected Discharge Plan: Skilled Nursing Facility Barriers to Discharge: Continued Medical Work up, Awaiting State Approval Cherlyn Roberts), SNF Pending bed offer  Expected Discharge Plan and Services In-house Referral: Clinical Social Work Discharge Planning Services: NA Post Acute Care Choice: Skilled Nursing Facility Living arrangements for the past 2 months: Apartment                 DME Arranged: N/A DME Agency: NA                   Social Determinants of Health (SDOH) Interventions SDOH Screenings   Food Insecurity: No Food Insecurity (08/29/2022)  Housing: Low Risk  (08/29/2022)  Transportation Needs: Unmet Transportation Needs (08/29/2022)  Utilities: Not At Risk (08/29/2022)  Alcohol Screen: Low Risk  (10/07/2021)  Depression (PHQ2-9): Medium Risk (04/07/2022)  Financial Resource Strain: High Risk (01/23/2022)  Physical Activity: Inactive (04/07/2022)  Social Connections: Moderately Isolated (10/07/2021)  Stress: Stress Concern Present (04/07/2022)  Tobacco Use: High Risk (08/29/2022)    Readmission Risk Interventions     No data to display

## 2022-09-02 NOTE — Care Management Important Message (Signed)
Important Message  Patient Details IM Letter given. Name: Shannon [name] MRN: [id number] Date of Birth: 11-23-1965   Medicare Important Message Given:  Yes     Caren Macadam 09/02/2022, 12:03 PM

## 2022-09-02 NOTE — Progress Notes (Signed)
PROGRESS NOTE    Shannon [name]  ZHY:[id number] DOB: 03-18-1965 DOA: 08/28/2022 PCP: Avanell Shackleton, NP-C   Brief Narrative:  57 y.o. female with medical history significant of obesity, anemia, anxiety/bipolar disorder, chronic pain syndrome, degenerative disease, hypertension, hyperlipidemia, insomnia, morbid obesity, neuropathy, history of peptic ulcer disease, anemia, GERD presented with several episodes of fall with generalized weakness and tremors along with worsening diarrhea not improving with Imodium at home.  She was found to be positive for C. difficile and was started on Dificid.  PT recommended SNF placement.  Assessment & Plan:   C. difficile colitis/diarrhea -CT of abdomen and pelvis on 08/28/2022 showed fatty liver infiltration, no other acute finding. -Currently on fidaxomicin: Complete 10-day course of therapy.  Continue enteric precaution. -Diarrhea slightly improving and becoming more formed today  Fall at home Deconditioning/debility and weakness -Likely in the setting of diarrhea and electrolyte imbalance causing deconditioning -MRI of lumbar spine showed DJD.  MRI of cervical spine was unremarkable -PT/OT recommending SNF.  TOC following.  Hyponatremia -Resolved  Hypokalemia -Resolved  Hypocalcemia Improved  Anemia of chronic disease -From chronic illnesses.  Hemoglobin stable.  Monitor intermittently.  Folate deficiency -Continue supplementation  Hypertension Hyperlipidemia -Continue amlodipine and Lipitor  Bipolar disorder GAD -Stable.  Continue Klonopin, Seroquel and Neurontin.  Outpatient follow-up with PCP/psychiatry  GERD -Continue PPI  Sub-5 mm lung nodules -Outpatient follow-up  Morbid obesity -Outpatient follow-up   DVT prophylaxis: Lovenox Code Status: Full Family Communication: None at bedside Disposition Plan: Status is: Inpatient Remains inpatient appropriate because: Of severity of illness.  Need for SNF placement.   Currently medically stable for discharge to SNF    Consultants: None  Procedures: None  Antimicrobials:  Anti-infectives (From admission, onward)    Start     Dose/Rate Route Frequency Ordered Stop   08/30/22 1000  fidaxomicin (DIFICID) tablet 200 mg        200 mg Oral 2 times daily 08/30/22 0754 09/09/22 0959         Subjective: Patient seen and examined at bedside.  Feels slightly better and states that her stool is getting more formed today.  No fever, worsening abdominal pain or vomiting reported.  Objective: Vitals:   09/01/22 0637 09/01/22 1136 09/01/22 1953 09/02/22 0453  BP: (!) 148/93 137/71 135/65 135/80  Pulse: 97 86 87 97  Resp: 18 17 20 18   Temp: 97.7 F (36.5 C) 97.9 F (36.6 C) 98.9 F (37.2 C) 98 F (36.7 C)  TempSrc: Oral Oral Oral   SpO2: 95% 97% 100% 95%  Weight:      Height:        Intake/Output Summary (Last 24 hours) at 09/02/2022 1019 Last data filed at 09/02/2022 0900 Gross per 24 hour  Intake 596 ml  Output --  Net 596 ml   Filed Weights   08/28/22 1301 08/28/22 2241  Weight: 99.8 kg 109.2 kg    Examination:  General exam: Appears calm and comfortable.  On room air. Respiratory system: Bilateral decreased breath sounds at bases with some scattered crackles Cardiovascular system: S1 & S2 heard, Rate controlled Gastrointestinal system: Abdomen is morbidly obese, distended, soft and nontender. Normal bowel sounds heard. Extremities: No cyanosis, clubbing; trace lower extremity edema present    Data Reviewed: I have personally reviewed following labs and imaging studies  CBC: Recent Labs  Lab 08/28/22 1350 08/29/22 0106 08/30/22 0645  WBC 6.9 5.5 5.2  NEUTROABS 4.4  --  2.7  HGB 11.1* 9.9* 10.2*  HCT 33.1* 30.7* 32.0*  MCV 86.0 88.7 90.7  PLT 308 244 251   Basic Metabolic Panel: Recent Labs  Lab 08/28/22 1350 08/28/22 1358 08/29/22 0106 08/30/22 0645 08/31/22 1042  NA  --  137 138 139 141  K  --  3.1* 2.9* 4.4 4.9   CL  --  97* 101 106 110  CO2  --  31 28 28 25   GLUCOSE  --  95 142* 88 105*  BUN  --  <5* <5* <5* 5*  CREATININE  --  0.89 0.84 0.68 0.62  CALCIUM  --  6.3* 6.4* 7.2* 7.5*  MG  --  1.4* 1.8  --   --   PHOS 3.2  --   --   --   --    GFR: Estimated Creatinine Clearance: 92.6 mL/min (by C-G formula based on SCr of 0.62 mg/dL). Liver Function Tests: Recent Labs  Lab 08/28/22 1358  AST 21  ALT 17  ALKPHOS 131*  BILITOT 0.8  PROT 6.5  ALBUMIN 2.2*   Recent Labs  Lab 08/28/22 1350  LIPASE 19   No results for input(s): "AMMONIA" in the last 168 hours. Coagulation Profile: Recent Labs  Lab 08/29/22 0106  INR 1.0   Cardiac Enzymes: Recent Labs  Lab 08/29/22 0106  CKTOTAL 54   BNP (last 3 results) No results for input(s): "PROBNP" in the last 8760 hours. HbA1C: No results for input(s): "HGBA1C" in the last 72 hours. CBG: No results for input(s): "GLUCAP" in the last 168 hours. Lipid Profile: No results for input(s): "CHOL", "HDL", "LDLCALC", "TRIG", "CHOLHDL", "LDLDIRECT" in the last 72 hours. Thyroid Function Tests: No results for input(s): "TSH", "T4TOTAL", "FREET4", "T3FREE", "THYROIDAB" in the last 72 hours. Anemia Panel: No results for input(s): "VITAMINB12", "FOLATE", "FERRITIN", "TIBC", "IRON", "RETICCTPCT" in the last 72 hours. Sepsis Labs: No results for input(s): "PROCALCITON", "LATICACIDVEN" in the last 168 hours.  Recent Results (from the past 240 hour(s))  Gastrointestinal Panel by PCR , Stool     Status: None   Collection Time: 08/29/22  4:26 PM   Specimen: STOOL  Result Value Ref Range Status   Campylobacter species NOT DETECTED NOT DETECTED Final   Plesimonas shigelloides NOT DETECTED NOT DETECTED Final   Salmonella species NOT DETECTED NOT DETECTED Final   Yersinia enterocolitica NOT DETECTED NOT DETECTED Final   Vibrio species NOT DETECTED NOT DETECTED Final   Vibrio cholerae NOT DETECTED NOT DETECTED Final   Enteroaggregative E coli (EAEC)  NOT DETECTED NOT DETECTED Final   Enteropathogenic E coli (EPEC) NOT DETECTED NOT DETECTED Final   Enterotoxigenic E coli (ETEC) NOT DETECTED NOT DETECTED Final   Shiga like toxin producing E coli (STEC) NOT DETECTED NOT DETECTED Final   Shigella/Enteroinvasive E coli (EIEC) NOT DETECTED NOT DETECTED Final   Cryptosporidium NOT DETECTED NOT DETECTED Final   Cyclospora cayetanensis NOT DETECTED NOT DETECTED Final   Entamoeba histolytica NOT DETECTED NOT DETECTED Final   Giardia lamblia NOT DETECTED NOT DETECTED Final   Adenovirus F40/41 NOT DETECTED NOT DETECTED Final   Astrovirus NOT DETECTED NOT DETECTED Final   Norovirus GI/GII NOT DETECTED NOT DETECTED Final   Rotavirus A NOT DETECTED NOT DETECTED Final   Sapovirus (I, II, IV, and V) NOT DETECTED NOT DETECTED Final    Comment: Performed at [facility], [address], [place], [place] [address]  C Difficile Quick Screen w PCR reflex     Status: Abnormal   Collection Time: 08/29/22  4:26 PM  Specimen: STOOL  Result Value Ref Range Status   C Diff antigen POSITIVE (A) NEGATIVE Final   C Diff toxin NEGATIVE NEGATIVE Final   C Diff interpretation Results are indeterminate. See PCR results.  Final    Comment: Performed at [facility], [address], [place], [place] [address]  C. Diff by PCR, Reflexed     Status: Abnormal   Collection Time: 08/29/22  4:26 PM  Result Value Ref Range Status   Toxigenic C. Difficile by PCR POSITIVE (A) NEGATIVE Final    Comment: Positive for toxigenic C. difficile with little to no toxin production. Only treat if clinical presentation suggests symptomatic illness. Performed at [facility], [address] [place], [place] [address]          Radiology Studies: No results found.      Scheduled Meds:  amLODipine  5 mg Oral QHS   atorvastatin  20 mg Oral Daily   vitamin B-12  2,000 mcg Oral Daily   enoxaparin (LOVENOX) injection  40 mg Subcutaneous Q24H    fidaxomicin  200 mg Oral BID   FLUoxetine  20 mg Oral QHS   folic acid  1 mg Oral Daily   gabapentin  300 mg Oral BID   hydrocortisone-pramoxine  1 applicator Rectal BID   magnesium oxide  400 mg Oral BID   melatonin  3 mg Oral QHS   pantoprazole  40 mg Oral BID AC   potassium chloride  20 mEq Oral BID   QUEtiapine  25 mg Oral Daily   QUEtiapine  300 mg Oral QHS   sodium chloride flush  3 mL Intravenous Q12H   Continuous Infusions:        Glade Lloyd, MD [facility] 09/02/2022, 10:19 AM

## 2022-09-02 NOTE — Progress Notes (Signed)
Physical Therapy Treatment Patient Details Name: Shannon [name] MRN: [id number] DOB: [birth year] Today's Date: 09/02/2022   History of Present Illness 57 y.o. female with past medical history significant for but not limited to: pernicious anemia and iron deficiency anemia, anxiety, lumbar degenerative disc disease, hyperlipidemia, hypertension, substance abuse, morbid obesity, osteopenia, PUD, H. pylori, thiamine deficiency, and vitamin D deficiency.  Patient reports that she has had several episodes of falling at least 12 at home which are preceded by lower extremity weakness, and some tremors.  Pt admitted with falls at home, chronic falls at home - Current fall is likely precipitated/exacerbated by patient having electrolyte abnormalities.    PT Comments   Pt admitted with above diagnosis.  Pt currently with functional limitations due to the deficits listed below (see PT Problem List). Pt agreeable to therapy tx session in pm. Pt reported some mild LBP, ongoing B LE abn sensation and having to cancel OP follow up with neurologist. Pt required S for supine to sit, min A x 2 with pull to stand at RW from EOB, min guard, RW and recliner close for amb 20 feet. Pt left seated in recliner and all needs in place. Pt will benefit from acute skilled PT in current and next venue to increase their independence and safety with mobility to allow discharge.       Assistance Recommended at Discharge Intermittent Supervision/Assistance  If plan is discharge home, recommend the following:  Can travel by private vehicle    Assist for transportation;Help with stairs or ramp for entrance;A little help with walking and/or transfers;A little help with bathing/dressing/bathroom;Assistance with cooking/housework   No  Equipment Recommendations  Rolling walker (2 wheels)    Recommendations for Other Services       Precautions / Restrictions Precautions Precautions: Fall Restrictions Weight Bearing  Restrictions: No     Mobility  Bed Mobility Overal bed mobility: Needs Assistance Bed Mobility: Sidelying to Sit   Sidelying to sit: Supervision       General bed mobility comments: increased time min cues, HOB elevated    Transfers Overall transfer level: Needs assistance Equipment used: Rolling walker (2 wheels) Transfers: Sit to/from Stand Sit to Stand: Min assist, +2 physical assistance           General transfer comment: cues for proper technique however pt elects to pull to stand from elevated EOB, use of B posterior LEs on bed to stabilize for initial standing balance    Ambulation/Gait Ambulation/Gait assistance: Min guard Gait Distance (Feet): 20 Feet Assistive device: Rolling walker (2 wheels) Gait Pattern/deviations: Step-to pattern, Wide base of support, Trunk flexed Gait velocity: decreaseed     General Gait Details: recliner close, encouragement for increased distance, pt reporting fatigue. pt states rollator at home. PT made pt aware of increased stability with RW and therapist in next venue can determine safe progrssion to return to rollator.   Stairs             Wheelchair Mobility     Tilt Bed    Modified Rankin (Stroke Patients Only)       Balance Overall balance assessment: History of Falls, Mild deficits observed, not formally tested                                          Cognition Arousal/Alertness: Awake/alert Behavior During Therapy: WFL for tasks assessed/performed Overall Cognitive Status:  Within Functional Limits for tasks assessed                                          Exercises      General Comments        Pertinent Vitals/Pain Pain Assessment Pain Assessment: Faces Faces Pain Scale: Hurts a little bit Pain Location: back Pain Descriptors / Indicators: Constant, Discomfort Pain Intervention(s): Limited activity within patient's tolerance, Monitored during session,  Repositioned    Home Living                          Prior Function            PT Goals (current goals can now be found in the care plan section) Acute Rehab PT Goals PT Goal Formulation: With patient Time For Goal Achievement: 09/12/22 Potential to Achieve Goals: Good Progress towards PT goals: Progressing toward goals    Frequency    Min 1X/week      PT Plan Current plan remains appropriate    Co-evaluation              AM-PAC PT "6 Clicks" Mobility   Outcome Measure  Help needed turning from your back to your side while in a flat bed without using bedrails?: A Little Help needed moving from lying on your back to sitting on the side of a flat bed without using bedrails?: A Little Help needed moving to and from a bed to a chair (including a wheelchair)?: A Little Help needed standing up from a chair using your arms (e.g., wheelchair or bedside chair)?: A Little Help needed to walk in hospital room?: A Little Help needed climbing 3-5 steps with a railing? : Total 6 Click Score: 16    End of Session Equipment Utilized During Treatment: Gait belt Activity Tolerance: Patient tolerated treatment well Patient left: in chair;with call bell/phone within reach;with chair alarm set Nurse Communication: Mobility status PT Visit Diagnosis: Difficulty in walking, not elsewhere classified (R26.2);Repeated falls (R29.6)     Time: 1610-9604 PT Time Calculation (min) (ACUTE ONLY): 23 min  Charges:    $Gait Training: 8-22 mins $Therapeutic Activity: 8-22 mins PT General Charges $$ ACUTE PT VISIT: 1 Visit                     Shannon [name], PT Acute Rehab   Jacqualyn Posey 09/02/2022, 12:47 PM

## 2022-09-02 NOTE — Progress Notes (Signed)
PT Note  Patient Details Name: Shannon [name] MRN: [id number] DOB: 1965-10-27   Cancelled Treatment:    Reason Eval/Treat Not Completed: Patient declined, no reason specified. Pt declined to participate with therapy this am. Pt reported she is going to d/c from hospital and transition to SNF and just waiting on the SW.   Johnny Bridge, PT Acute Rehab  Jacqualyn Posey 09/02/2022, 11:29 AM

## 2022-09-02 NOTE — Progress Notes (Signed)
Pt called this RN into room to state that pt wanted to leave. This RN instructed pt that she was not medically cleared to be discharged at this time. Pt insisted that she wanted to leave anyway. This RN educated pt on risks of leaving the hospital without provider clearance and against medical advice. Pt instructed that if she were to leave and she decided that she wanted further care, she would have to go back into the ED. Pt requested AMA paperwork to sign so that she can leave. Pt given AMA paperwork, which she signed knowing all risks involved. Pts IV removed. Pt gathered all belongings and ambulated off unit

## 2022-09-03 ENCOUNTER — Ambulatory Visit (INDEPENDENT_AMBULATORY_CARE_PROVIDER_SITE_OTHER): Payer: Medicare Other

## 2022-09-03 ENCOUNTER — Encounter: Payer: Self-pay | Admitting: *Deleted

## 2022-09-03 ENCOUNTER — Telehealth: Payer: Self-pay

## 2022-09-03 ENCOUNTER — Telehealth: Payer: Self-pay | Admitting: *Deleted

## 2022-09-03 DIAGNOSIS — I10 Essential (primary) hypertension: Secondary | ICD-10-CM

## 2022-09-03 DIAGNOSIS — F3160 Bipolar disorder, current episode mixed, unspecified: Secondary | ICD-10-CM

## 2022-09-03 DIAGNOSIS — Z748 Other problems related to care provider dependency: Secondary | ICD-10-CM

## 2022-09-03 NOTE — Transitions of Care (Post Inpatient/ED Visit) (Signed)
09/03/2022  Name: Shannon [name] MRN: [id number] DOB: 07-24-1965  Today's TOC FU Call Status: Today's TOC FU Call Status:: Successful TOC FU Call Competed TOC FU Call Complete Date: 09/03/22  Transition Care Management Follow-up Telephone Call Date of Discharge: 09/02/22 (left AMA) Discharge Facility: [facility]) Type of Discharge: Inpatient Admission Primary Inpatient Discharge Diagnosis:: fall at home; LE weakness; C-difficile-- left AMA 09/02/22 while SNF rehab facility placement was pending for 09/03/22 How have you been since you were released from the hospital?: Worse (Full medication reconciliation/ review completed; discrepancies identified; confirmed patient not taking many medications as instructed; self-manages medications) Any questions or concerns?: Yes (left hospital AMA; current poor living conditions- reports unable to care for self well at home and "has bugs in my house") Patient Questions/Concerns:: left hospital AMA on 09/02/22 while SNF rehab facility placement was pending- states she left because her when her sister brought her clothes to her in hospital, sister reported "bugs and maggots all over my house;" she left via [facility] due to "being worried about my house;" she also verbalizes concerns around ability to perform self-care now that she is at home, ambulation, and reports she has an upcoming trip to [place] planned for 09/27/22 that she was worrying about being able to take Patient Questions/Concerns Addressed: Other:, Notified Provider of Patient Questions/Concerns (notified currently active RN CM Juanell Fairly and CM scheduler of patients AMA hospital departure, for prompt scheduling for follow up)  Items Reviewed: Did you receive and understand the discharge instructions provided?: No (left AMA and was not provided AVS- no discharge instructions) Medications obtained,verified, and reconciled?: Yes (Medications Reviewed) (Full medication reconciliation/ review  completed; discrepancies identified; patient reports not taking many Rx'd medications as instructed; self-manages medications) Any new allergies since your discharge?: No Dietary orders reviewed?: Yes Type of Diet Ordered:: Regular Do you have support at home?: No (lives alone; today states her sister has helped in past, but she is not currently talking to sister due to "she went in my bedroom and discovered my little secret about my living conditions so I am not talking to her right now")  Medications Reviewed Today: Medications Reviewed Today     Reviewed by Michaela Corner, RN (Registered Nurse) on 09/03/22 at 1138  Med List Status: <None>   Medication Order Taking? Sig Documenting Provider Last Dose Status Informant  amLODipine (NORVASC) 5 MG tablet [id number] No Take 1 tablet (5 mg total) by mouth daily.  Patient not taking: Reported on 08/28/2022   Avanell Shackleton, NP-C Not Taking Active Self           Med Note Jonnie Kind Sep 03, 2022 11:35 AM) 09/03/22- reports during [facility] call not currently taking- states "I don't have this medication"  atorvastatin (LIPITOR) 20 MG tablet [id number] No Take 1 tablet (20 mg total) by mouth daily.  Patient not taking: Reported on 09/03/2022   Avanell Shackleton, NP-C Not Taking Active Self           Med Note Jonnie Kind Sep 03, 2022 11:34 AM) 09/03/22- reports during [facility] call not currently taking- states "I don't have this medication"  clonazePAM (KLONOPIN) 0.5 MG tablet [id number] Yes Take 1 tablet (0.5 mg total) by mouth 2 (two) times daily as needed for anxiety.  Patient taking differently: Take 0.5 mg by mouth See admin instructions. Take 0.5 mg by mouth at bedtime and an additional 0.5 mg once a day as needed  for anxiety   Arfeen, Phillips Grout, MD Taking Active Self  FLUoxetine (PROZAC) 20 MG capsule [id number] Yes Take 1 capsule (20 mg total) by mouth daily.  Patient taking differently: Take 20 mg by mouth at bedtime.   Cleotis Nipper, MD  Taking Active Self  gabapentin (NEURONTIN) 300 MG capsule [id number] Yes Take 1 capsule (300 mg total) by mouth 2 (two) times daily.  Patient taking differently: Take 300 mg by mouth 2 (two) times daily as needed (for neuropathy).   Nita Sickle K, DO Taking Active Self  glycerin adult 2 g suppository [id number] No Place 1 suppository rectally as needed for constipation.  Patient not taking: Reported on 09/03/2022   [provider] Not Taking Active Self           Med Note Jonnie Kind Sep 03, 2022 11:38 AM) 09/03/22- reports during [facility] call not currently taking- states "I don't have this medication"  IMODIUM A-D 2 MG tablet [id number] No Take 2 mg by mouth 4 (four) times daily as needed for diarrhea or loose stools.  Patient not taking: Reported on 09/03/2022   [provider] Not Taking Consider Medication Status and Discontinue Self           Med Note Jonnie Kind Sep 03, 2022 11:38 AM) 09/03/22- reports during [facility] call not currently taking- states "I don't have this medication"  Multiple Vitamins-Minerals (BARIATRIC MULTIVITAMINS/IRON) CAPS [id number] No Take 1 capsule by mouth daily.  Patient not taking: Reported on 09/03/2022   Avanell Shackleton, NP-C Not Taking Active Self           Med Note Jonnie Kind Sep 03, 2022 11:37 AM) 09/03/22- reports during [facility] call not currently taking- states "I don't have this medication"  pantoprazole (PROTONIX) 40 MG tablet [id number] Yes Take 1 tablet (40 mg total) by mouth 2 (two) times daily before a meal. Henson, [name], NP-C Taking Active Self  QUEtiapine (SEROQUEL) 25 MG tablet [id number] Yes Take 1 tablet (25 mg total) by mouth 2 (two) times daily.  Patient taking differently: Take 25 mg by mouth See admin instructions. Take 25 mg by mouth once a day and an additional 25 mg once a day "as needed"/as directed   Cleotis Nipper, MD Taking Active Self           Med Note Antony Madura, Arn Medal   Fri Aug 28, 2022  9:29 PM)  The "prn" dose's indication was not named by the patient  QUEtiapine (SEROQUEL) 300 MG tablet [id number] Yes Take 1 tablet (300 mg total) by mouth at bedtime. Cleotis Nipper, MD Taking Active Self           Home Care and Equipment/Supplies: Were Home Health Services Ordered?: NA (left AMA- was supposed to go to SNF rehab at time of hospital departure) Any new equipment or medical supplies ordered?: NA (left AMA- was suposed to go to SNF for short term rehab at discharge)  Functional Questionnaire: Do you need assistance with bathing/showering or dressing?: Yes (Reports needs assistance with all self-care/ hygeine activities) Do you need assistance with meal preparation?: Yes (Reports unable to prepare food- reports she "calls out" for food delivery) Do you need assistance with eating?: No Do you have difficulty maintaining continence: Yes (Reports ongoing issues with incontinence post- leaving hospital AMA on 09/02/22) Do you need assistance with getting out of bed/getting out of a chair/moving?: Yes (reports unable  to leave her home due to lower extremity weakness; uses walker currently which she reports is somewhat helpful in mobility at home) Do you have difficulty managing or taking your medications?: No (self-manages medications and reports several concerns/ discrepancies around current medications)  Follow up appointments reviewed: PCP Follow-up appointment confirmed?: Yes (care coordination outreach in real-time with scheduling care guide to successfully schedule hospital follow up PCP appointment 09/09/22) Date of PCP follow-up appointment?: 09/09/22 (virtual appointment) Follow-up Provider: PCP Specialist Hospital Follow-up appointment confirmed?: NA Do you need transportation to your follow-up appointment?: No (virtual appointment) Do you understand care options if your condition(s) worsen?: Yes-patient verbalized understanding  SDOH Interventions Today    Flowsheet Row Most  Recent Value  SDOH Interventions   Food Insecurity Interventions Intervention Not Indicated  [reports "calls out" for food delivery- states unable to prepare food herself,  declines assistance with resources today stating she "can't leave house so I'll just keep calling out"]  Housing Interventions Other (Comment)  [Patient currently active with RN CM-- sent prompt notification to active RN CM currently active/ involved in patient's care]  Transportation Interventions Other (Comment)  [updated currently active CCM team around ongoing needs for transportation and other resources]      TOC Interventions Today    Flowsheet Row Most Recent Value  TOC Interventions   TOC Interventions Discussed/Reviewed TOC Interventions Discussed, Arranged PCP follow up within 7 days/Care Guide scheduled, Contacted provider for patient needs      Interventions Today    Flowsheet Row Most Recent Value  Chronic Disease   Chronic disease during today's visit Other  [bilateral LE weakness, fall at home, C-Diff-- left AMA]  General Interventions   General Interventions Discussed/Reviewed General Interventions Discussed, Durable Medical Equipment (DME), Level of Care, [facility], Doctor Visits, Referral to Nurse  [confirmed patient active with CCM RN CM-- made CCM team currently active in patient's care aware of need for prompt outreach]  Doctor Visits Discussed/Reviewed Doctor Visits Discussed, Doctor Visits Reviewed, PCP  Durable Medical Equipment (DME) Thea Alken has and is periodically uses walker]  PCP/Specialist Visits Compliance with follow-up visit  Level of Care Skilled Nursing Facility  [SNF Rehab placement with [facility] in progress was planned for 09/03/22 but patient left AMA 09/02/22]  Education Interventions   Education Provided Provided Education  Provided Verbal Education On When to see the doctor, [facility], Other  [process to resume efforts to be placed in rehab facility]   Nutrition Interventions   Nutrition Discussed/Reviewed Nutrition Discussed  Pharmacy Interventions   Pharmacy Dicussed/Reviewed Pharmacy Topics Discussed  Safety Interventions   Safety Discussed/Reviewed Safety Discussed, Fall Risk, Home Safety  Home Safety Assistive Devices, Need for home safety assessment      Caryl Pina, RN, BSN, CCRN Alumnus RN CM Care Coordination/ Transition of Care- [facility] [phone]: direct office

## 2022-09-03 NOTE — Discharge Summary (Signed)
[facility] Discharge Summary   Patient: Shannon [name]:[id number]   PCP: Avanell Shackleton, NP-C DOB: [birth year]   Date of admission: 08/28/2022   Date of discharge: 09/02/2022    Discharge Disposition: Patient signed out AMA despite being explained the risks of doing so including worsening medical condition as well as death.   Discharge Diagnoses:  C. difficile colitis/diarrhea Fall at home Deconditioning/debility and weakness Hyponatremia Hypokalemia Hypocalcemia Anemia of chronic disease Folate deficiency Hypertension Hyperlipidemia Bipolar disorder GAD GERD Sub-5 mm lung nodules Morbid obesity  Discharge Condition: Guarded  Hospital Course:  57 y.o. female with medical history significant of obesity, anemia, anxiety/bipolar disorder, chronic pain syndrome, degenerative disease, hypertension, hyperlipidemia, insomnia, morbid obesity, neuropathy, history of peptic ulcer disease, anemia, GERD presented with several episodes of fall with generalized weakness and tremors along with worsening diarrhea not improving with Imodium at home.  She was found to be positive for C. difficile and was started on Dificid.  PT recommended SNF placement.   patient left AMA on 09/02/2022.  Procedures and Results: None  Consultations: None  The results of significant diagnostics from this hospitalization (including imaging, microbiology, ancillary and laboratory) are listed below for reference.    Significant Diagnostic Studies: MR CERVICAL SPINE W WO CONTRAST  Result Date: 08/30/2022 CLINICAL DATA:  Ataxia EXAM: MRI CERVICAL SPINE WITHOUT AND WITH CONTRAST TECHNIQUE: Multiplanar and multiecho pulse sequences of the cervical spine, to include the craniocervical junction and cervicothoracic junction, were obtained without and with intravenous contrast. CONTRAST:  10mL GADAVIST GADOBUTROL 1 MMOL/ML IV SOLN COMPARISON:  None Available. FINDINGS: Alignment: Physiologic. Vertebrae: No  fracture, evidence of discitis, or bone lesion. Cord: Normal signal and morphology. Susceptibility artifacts from an unidentifiable metal object partially obscure the spinal canal on T1-weighted imaging. Posterior Fossa, vertebral arteries, paraspinal tissues: Negative. Disc levels: No spinal canal or neural foraminal stenosis. IMPRESSION: Unremarkable MRI of the cervical spine. Electronically Signed   By: Deatra Robinson M.D.   On: 08/30/2022 18:43   MR LUMBAR SPINE WO CONTRAST  Result Date: 08/28/2022 CLINICAL DATA:  Low back pain EXAM: MRI LUMBAR SPINE WITHOUT CONTRAST TECHNIQUE: Multiplanar, multisequence MR imaging of the lumbar spine was performed. No intravenous contrast was administered. COMPARISON:  None Available. FINDINGS: Segmentation:  Standard. Alignment:  Physiologic. Vertebrae:  No fracture, evidence of discitis, or bone lesion. Conus medullaris and cauda equina: Conus extends to the L1 level. Conus and cauda equina appear normal. Paraspinal and other soft tissues: Negative. Disc levels: L1-L2: Normal disc space and facet joints. No spinal canal stenosis. No neural foraminal stenosis. L2-L3: Normal disc space and facet joints. No spinal canal stenosis. No neural foraminal stenosis. L3-L4: Normal disc space and facet joints. No spinal canal stenosis. No neural foraminal stenosis. L4-L5: Small disc bulge and mild facet hypertrophy. No spinal canal stenosis. No neural foraminal stenosis. L5-S1: Small disc bulge and mild facet hypertrophy. No spinal canal stenosis. No neural foraminal stenosis. Visualized sacrum: Normal. IMPRESSION: Mild lower lumbar degenerative disc disease without spinal canal or neural foraminal stenosis. Electronically Signed   By: Deatra Robinson M.D.   On: 08/28/2022 19:04   CT ABDOMEN PELVIS W CONTRAST  Result Date: 08/28/2022 CLINICAL DATA:  Abdominal pain. Fall last night with weakness and tingling in legs. EXAM: CT ABDOMEN AND PELVIS WITH CONTRAST TECHNIQUE: Multidetector CT  imaging of the abdomen and pelvis was performed using the standard protocol following bolus administration of intravenous contrast. RADIATION DOSE REDUCTION: This exam was performed according to the departmental  dose-optimization program which includes automated exposure control, adjustment of the mA and/or kV according to patient size and/or use of iterative reconstruction technique. CONTRAST:  OMNIPAQUE IOHEXOL 300 MG/ML  SOLN COMPARISON:  CT 12/25/2021. FINDINGS: Lower chest: There are several tiny sub 5 mm lung nodules identified the bases. These are essentially unchanged from previous examination such as left lower lobe on series 6, image 23, middle lobe image 19 and 9, right lower lobe on image 11 amongst others. These stable. There is some basilar scar or atelectasis identified. No pleural effusion. Small hiatal hernia. There is slight wall thickening of the distal esophagus. Trace pericardial fluid. Hepatobiliary: Fatty liver infiltration. Patent portal vein. Gallbladder is present. Pancreas: Diffuse atrophy of the pancreas without mass lesion. Spleen: Normal in size without focal abnormality. Adrenals/Urinary Tract: Adrenal glands are preserved. Mild renal atrophy. No collecting system dilatation or enhancing renal mass. The ureters have normal course and caliber extending down to the bladder. Preserved contours of the urinary bladder. Stomach/Bowel: No oral contrast. Large bowel is nondilated. There are scattered areas of some low-density along the wall of the colon. Please correlate for any known history of chronic inflammatory state. Normal appendix in the right lower quadrant, hemipelvis extending medial to the cecum. 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 Vascular/Lymphatic: Aortic atherosclerosis. No enlarged abdominal or pelvic lymph nodes. Reproductive: Status post hysterectomy. No adnexal masses. Other: Mild anasarca.  No free air or free fluid. Musculoskeletal: Degenerative changes of the  spine and pelvis particularly the right hip. IMPRESSION: No bowel obstruction, free air or free fluid. Previous surgical changes of gastric sleeve with a hiatal hernia and slight wall thickening of the distal esophagus. Normal appendix. Fatty liver infiltration. Stable multiple sub 5 mm lung nodules at the lung bases. Recommend follow-up in 1 year. Electronically Signed   By: Karen Kays M.D.   On: 08/28/2022 15:39    Microbiology: Recent Results (from the past 240 hour(s))  Gastrointestinal Panel by PCR , Stool     Status: None   Collection Time: 08/29/22  4:26 PM   Specimen: STOOL  Result Value Ref Range Status   Campylobacter species NOT DETECTED NOT DETECTED Final   Plesimonas shigelloides NOT DETECTED NOT DETECTED Final   Salmonella species NOT DETECTED NOT DETECTED Final   Yersinia enterocolitica NOT DETECTED NOT DETECTED Final   Vibrio species NOT DETECTED NOT DETECTED Final   Vibrio cholerae NOT DETECTED NOT DETECTED Final   Enteroaggregative E coli (EAEC) NOT DETECTED NOT DETECTED Final   Enteropathogenic E coli (EPEC) NOT DETECTED NOT DETECTED Final   Enterotoxigenic E coli (ETEC) NOT DETECTED NOT DETECTED Final   Shiga like toxin producing E coli (STEC) NOT DETECTED NOT DETECTED Final   Shigella/Enteroinvasive E coli (EIEC) NOT DETECTED NOT DETECTED Final   Cryptosporidium NOT DETECTED NOT DETECTED Final   Cyclospora cayetanensis NOT DETECTED NOT DETECTED Final   Entamoeba histolytica NOT DETECTED NOT DETECTED Final   Giardia lamblia NOT DETECTED NOT DETECTED Final   Adenovirus F40/41 NOT DETECTED NOT DETECTED Final   Astrovirus NOT DETECTED NOT DETECTED Final   Norovirus GI/GII NOT DETECTED NOT DETECTED Final   Rotavirus A NOT DETECTED NOT DETECTED Final   Sapovirus (I, II, IV, and V) NOT DETECTED NOT DETECTED Final    Comment: Performed at [facility], [address], [place], [place] [address]  C Difficile Quick Screen w PCR reflex     Status: Abnormal    Collection Time: 08/29/22  4:26 PM  Specimen: STOOL  Result Value Ref Range Status   C Diff antigen POSITIVE (A) NEGATIVE Final   C Diff toxin NEGATIVE NEGATIVE Final   C Diff interpretation Results are indeterminate. See PCR results.  Final    Comment: Performed at [facility], [address] [place], [place] [address]  C. Diff by PCR, Reflexed     Status: Abnormal   Collection Time: 08/29/22  4:26 PM  Result Value Ref Range Status   Toxigenic C. Difficile by PCR POSITIVE (A) NEGATIVE Final    Comment: Positive for toxigenic C. difficile with little to no toxin production. Only treat if clinical presentation suggests symptomatic illness. Performed at [facility], [address], [place], [place] [address]      Labs: CBC: Recent Labs  Lab 08/28/22 1350 08/29/22 0106 08/30/22 0645  WBC 6.9 5.5 5.2  NEUTROABS 4.4  --  2.7  HGB 11.1* 9.9* 10.2*  HCT 33.1* 30.7* 32.0*  MCV 86.0 88.7 90.7  PLT 308 244 251   Basic Metabolic Panel: Recent Labs  Lab 08/28/22 1350 08/28/22 1358 08/29/22 0106 08/30/22 0645 08/31/22 1042  NA  --  137 138 139 141  K  --  3.1* 2.9* 4.4 4.9  CL  --  97* 101 106 110  CO2  --  31 28 28 25   GLUCOSE  --  95 142* 88 105*  BUN  --  <5* <5* <5* 5*  CREATININE  --  0.89 0.84 0.68 0.62  CALCIUM  --  6.3* 6.4* 7.2* 7.5*  MG  --  1.4* 1.8  --   --   PHOS 3.2  --   --   --   --    Liver Function Tests: Recent Labs  Lab 08/28/22 1358  AST 21  ALT 17  ALKPHOS 131*  BILITOT 0.8  PROT 6.5  ALBUMIN 2.2*   Recent Labs  Lab 08/28/22 1350  LIPASE 19   No results for input(s): "AMMONIA" in the last 168 hours. Cardiac Enzymes: Recent Labs  Lab 08/29/22 0106  CKTOTAL 54   BNP (last 3 results) No results for input(s): "BNP" in the last 8760 hours. CBG: No results for input(s): "GLUCAP" in the last 168 hours.   Signed:  Glade Lloyd  [facility] 09/03/2022, 8:31 AM

## 2022-09-03 NOTE — Telephone Encounter (Signed)
   Telephone encounter was:  Successful.  09/03/2022 Name: Shannon [name] MRN: [id number] DOB: [birth year]  Shannon [name] is a 57 y.o. year old female who is a primary care patient of Suezanne Jacquet, [name], NP-C . The community resource team was consulted for assistance with Transportation Needs   Care guide performed the following interventions: Patient provided with information about care guide support team and interviewed to confirm resource needs.Patient requested information for transportation. Pt has Medicaid and has free transportation benefits. Contact resources was given to the patient over the phone. Patient has no other needs at this time   Follow Up Plan:  No further follow up planned at this time. The patient has been provided with needed resources.   Lenard Forth [facility] Guide, [facility] [phone] [address] [place], [place], [place] [address] Phone: [phone] Email: [email or web address]

## 2022-09-03 NOTE — Chronic Care Management (AMB) (Signed)
Chronic Care Management   CCM RN Visit Note  09/03/2022 Name: Shannon [name] MRN: [id number] DOB: [birth year]  Subjective: Shannon [name] is a 57 y.o. year old female who is a primary care patient of Suezanne Jacquet, [name], NP-C. The patient was referred to the Chronic Care Management team for assistance with care management needs subsequent to provider initiation of CCM services and plan of care.    Today's Visit:  Engaged with patient by telephone for follow up visit.       Goals Addressed             This Visit's Progress    Goal: CCM (Hypertension) Expected Outcome: Monitor, Self-Manage and Reduce Symptoms of Hypertension       Current Barriers:  Chronic Disease Management support and education needs related to Hypertension Lacks caregiver support.  Corporate treasurer.  Transportation barriers  Planned Interventions: Reviewed plan for management of hypertension.  Reports not having required prescriptions and has not taken her medication for several days.  Reports not monitoring BP d/t not obtaining a BP cuff. Denies episodes of chest pain, palpitations, or dizziness since returning home. Denies headaches or visual changes. Continues to experience weakness and muscle spasms but reports feeling better today. Discussed concerns r/t patient leaving AMA. Collaborated with PCP regarding recent hospitalization, need for hospital follow up and need for prescriptions. Discussed need for patient to complete and in-office clinic visit.  Patient reports a visit was scheduled by the [facility] nurse however she does not have transportation for the appointment. Urgent referral placed for transportation. Clinic visit with PCP scheduled for 09/09/22. Reviewed s/sx of heart attack, stroke and worsening symptoms that require immediate medical attention.   Symptom Management: Attend visit with PCP as scheduled.  Take medications as prescribed   Follow up regarding availability of a BP monitor and  monitor at least a few times a week. Maintain a BP log Call pharmacy for medication refills 3-7 days in advance of running out of medications Call provider office for new concerns or questions  Continue to work with the care management team to address care coordination needs. Seek immediate medical attention for worsening symptoms   Follow Up Plan:  Will update this month       Goal: CCM (Mixed Bipolar Disorder) Expected Outcome:  Monitor, Self-Manage And Reduce Symptoms of Mixed Bipolar Disorder       Current Barriers:  Chronic Disease Management support and education needs related to Mixed Bipolar Disorder Lacks caregiver support.  Corporate treasurer.  Transportation barriers  Planned Interventions: Discussed recent hospitalization. Hospitalized at [facility] on 08/28/22. Reports experiencing a fall in her bathroom a few days prior and notified EMS after several days of weakness, fatigue and limited ability to ambulate. Discussed concern related to patient leaving AMA. Confirmed that she was initially in agreement with plan to transition to a SNF for rehab and therapy. However, reports being "triggered" after speaking with the property manager regarding the condition of her rental unit. 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 Reports making several requests for the inpatient team to notify the clinic and care management team of her admission to discuss options but messages were not relayed. Thorough discussion regarding current plan and her ability to ambulate remains very limited and safety remains a primary concern. Denies falls since returning home but continues to experience weakness and muscle spasms. Thorough discussion  regarding safety and fall prevention measures. She is aware that Home Health PT/OT were not reordered, and the team will not be coming to the home. Agreed to  update her friends and Investment banker, corporate of her decision to leave AMA so that they are aware that is in the unit and will need assistance. Notes symptoms r/t mental health are better today.  She remains very eager to continue plan for relocation in August. Reports having medications ordered by [name]/Psychiatry and taking as prescribed. Reports not having any of her medications ordered by her PCP. She is aware that she will need to complete her clinic visit prior to additional medications being ordered. Collaborated with PCP regarding hospitalization, plan for clinic visit and need for multiple prescriptions. A virtual appointment was scheduled by the [facility] nurse. PCP has requested that patient be evaluated in the clinic. Urgent referral was placed for transportation assistance. Appointment scheduled for 09/09/22. Reviewed resources and need to seek immediate medical attention or Crisis team if symptoms r/t major depression return and worsen.   Symptom Management: Take medications as prescribed   Attend medical appointments as scheduled Call pharmacy for medication refills 3-7 days in advance of running out of medications Work with the care management team to address care coordination needs and will continue to work with the clinical team to address health care and disease management related needs Call provider office for new concerns or questions  Call the Suicide and Crisis Lifeline: 988 Call the [place] National Suicide Prevention Lifeline: (719)280-1794 or TTY: (509)843-9595 TTY (445)513-1249) to talk to a trained counselor Call 1-800-273-TALK (toll free, 24 hour hotline) Go to [facility] [address], [place] [phone]) Call 911 if experiencing a Mental Health or Behavioral Health Crisis   Follow Up Plan: Will follow up this month          PLAN: Will follow up this month   [facility]/Chronic Care  Management [phone]

## 2022-09-04 ENCOUNTER — Ambulatory Visit: Payer: Self-pay

## 2022-09-04 ENCOUNTER — Telehealth: Payer: Self-pay | Admitting: Neurology

## 2022-09-04 ENCOUNTER — Telehealth: Payer: Medicare Other

## 2022-09-04 DIAGNOSIS — F3160 Bipolar disorder, current episode mixed, unspecified: Secondary | ICD-10-CM

## 2022-09-04 DIAGNOSIS — Z748 Other problems related to care provider dependency: Secondary | ICD-10-CM

## 2022-09-04 NOTE — Telephone Encounter (Signed)
Patient called and wanted to let Dr. Allena Katz know that since her last time seeing her some things have been going on. Patient didn't have the MRI done because at the time she was going through a lot with her depression, bipolar, and going through a divorce. Patient states she was in the hospital on 08/28/22 and admitted for 6 days. She states that since her visit with Dr,. Patel her legs have become weaker and more numb. She states that she has been having frequent falls and now has to use a walker. She can't even get up or down the stairs from her apartment without the assistance from paramedics. Patient also informed me that she will be moving to [place] on August 4th and would like to see Dr. Allena Katz before them. Patient is afraid that her legs will become worse and she will not be able to feel her legs. She would like Dr. Eliane Decree help. Patient is aware Dr. Allena Katz is out of office for the day and will return on Monday.

## 2022-09-04 NOTE — Telephone Encounter (Signed)
Patient would like to speak to someone about what is going with patient

## 2022-09-07 NOTE — Telephone Encounter (Signed)
For expedited care, I recommend going to the hospital for evaluation, especially if she is unable to walk and with progressive symptoms. Please let pt know that we can schedule her for follow-up visit, but I do not have any immediate availability.

## 2022-09-08 ENCOUNTER — Ambulatory Visit: Payer: Self-pay

## 2022-09-08 DIAGNOSIS — F3160 Bipolar disorder, current episode mixed, unspecified: Secondary | ICD-10-CM

## 2022-09-08 DIAGNOSIS — Z748 Other problems related to care provider dependency: Secondary | ICD-10-CM

## 2022-09-08 NOTE — Telephone Encounter (Signed)
Called patient and informed her of Dr. Eliane Decree response  For expedited care, she recommends going to the hospital for evaluation, especially if you're unable to walk and with progressive symptoms. Informed pt that we can schedule her for follow-up visit, but I do not have any immediate availability.          I did let patient know that if there is no appointments soon enough for her she will need to go to the hospital for expedited care. Patient is aware that I will have someone from the front give her a call to get her scheduled for an appt with Dr. Allena Katz.

## 2022-09-09 ENCOUNTER — Ambulatory Visit: Payer: Medicare Other | Admitting: Family Medicine

## 2022-09-09 ENCOUNTER — Ambulatory Visit: Payer: Self-pay

## 2022-09-09 ENCOUNTER — Telehealth: Payer: Self-pay

## 2022-09-09 DIAGNOSIS — Z748 Other problems related to care provider dependency: Secondary | ICD-10-CM

## 2022-09-09 DIAGNOSIS — F3160 Bipolar disorder, current episode mixed, unspecified: Secondary | ICD-10-CM

## 2022-09-09 NOTE — Progress Notes (Signed)
 This encounter was created in error - please disregard.

## 2022-09-09 NOTE — Telephone Encounter (Signed)
   Telephone encounter was:  Successful.  09/09/2022 Name: TAWNIE EHRESMAN MRN: [id number] DOB: 10/28/1965  SHERLON NIED is a 57 y.o. year old female who is a primary care patient of Suezanne Jacquet, [name], NP-C . The community resource team was consulted for assistance with Transportation Needs   Care guide performed the following interventions: Patient provided with information about care guide support team and interviewed to confirm resource needs.Patients RN sent a staff message for transportation for 7/22 and I recapped with her on the resources of medicaid transportation. Patient is showing to have medicaid transportation which is free transportation for the Patient. Patient stated in our last conversation she would reach out to call Medicaid to set that up for herself.   Follow Up Plan:  No further follow up planned at this time. The patient has been provided with needed resources.    Lenard Forth [facility] Guide, [facility] [phone] [address] [place], [place], [place] [address] Phone: [phone] Email: [email or web address]

## 2022-09-10 ENCOUNTER — Telehealth: Payer: Self-pay | Admitting: Family Medicine

## 2022-09-10 ENCOUNTER — Other Ambulatory Visit: Payer: Self-pay | Admitting: Family Medicine

## 2022-09-10 ENCOUNTER — Telehealth (HOSPITAL_COMMUNITY): Payer: Self-pay | Admitting: *Deleted

## 2022-09-10 ENCOUNTER — Other Ambulatory Visit: Payer: Self-pay | Admitting: Neurology

## 2022-09-10 ENCOUNTER — Other Ambulatory Visit (HOSPITAL_COMMUNITY): Payer: Self-pay | Admitting: *Deleted

## 2022-09-10 ENCOUNTER — Ambulatory Visit: Payer: Self-pay

## 2022-09-10 DIAGNOSIS — E785 Hyperlipidemia, unspecified: Secondary | ICD-10-CM

## 2022-09-10 DIAGNOSIS — I10 Essential (primary) hypertension: Secondary | ICD-10-CM

## 2022-09-10 DIAGNOSIS — Z748 Other problems related to care provider dependency: Secondary | ICD-10-CM

## 2022-09-10 DIAGNOSIS — F4310 Post-traumatic stress disorder, unspecified: Secondary | ICD-10-CM

## 2022-09-10 DIAGNOSIS — F319 Bipolar disorder, unspecified: Secondary | ICD-10-CM

## 2022-09-10 MED ORDER — CLONAZEPAM 0.5 MG PO TABS
0.5000 mg | ORAL_TABLET | Freq: Two times a day (BID) | ORAL | 0 refills | Status: DC | PRN
Start: 2022-09-10 — End: 2022-11-12

## 2022-09-10 MED ORDER — QUETIAPINE FUMARATE 300 MG PO TABS
300.0000 mg | ORAL_TABLET | Freq: Every day | ORAL | 0 refills | Status: DC
Start: 2022-09-10 — End: 2022-11-12

## 2022-09-10 MED ORDER — QUETIAPINE FUMARATE 25 MG PO TABS
25.0000 mg | ORAL_TABLET | Freq: Two times a day (BID) | ORAL | 0 refills | Status: DC
Start: 2022-09-10 — End: 2022-11-12

## 2022-09-10 MED ORDER — FLUOXETINE HCL 20 MG PO CAPS
20.0000 mg | ORAL_CAPSULE | Freq: Every day | ORAL | 0 refills | Status: DC
Start: 2022-09-10 — End: 2022-11-12

## 2022-09-10 NOTE — Telephone Encounter (Signed)
Patient is leaving the country for 3 months.  She is requesting that Dahlia Client call her to discuss her medications.  Patient #:  [id number]

## 2022-09-10 NOTE — Telephone Encounter (Signed)
Called pt and she reports she will need a 3 month supply of her medications sent in for her upcoming trip. I advised pt she will need to be seen and we will wait for her upcoming appt on Monday to refill these medications. Pt verbalized understanding and states she is working w Sunny Schlein now to get transportation.

## 2022-09-10 NOTE — Telephone Encounter (Signed)
Done. Pt advised.

## 2022-09-10 NOTE — Telephone Encounter (Signed)
Pt called requesting updated scripts (90 days) for all meds as she is going [place] 6 months. Pt says that she was told by pharmacist that Medicare will pay for 90 day supply. Pt is leaving on 09/27/22. Last scripts sent in during last visit on 08/13/22 so will need new 90 day scripts for trip. Please review and advise.

## 2022-09-10 NOTE — Telephone Encounter (Signed)
Please go ahead and do a 90-day supply of meds

## 2022-09-11 ENCOUNTER — Telehealth: Payer: Self-pay | Admitting: *Deleted

## 2022-09-11 ENCOUNTER — Ambulatory Visit: Payer: Self-pay

## 2022-09-11 DIAGNOSIS — Z748 Other problems related to care provider dependency: Secondary | ICD-10-CM

## 2022-09-11 DIAGNOSIS — F3160 Bipolar disorder, current episode mixed, unspecified: Secondary | ICD-10-CM

## 2022-09-11 NOTE — Chronic Care Management (AMB) (Signed)
Chronic Care Management   CCM RN Visit Note  09/11/2022 Name: Shannon [name] MRN: [id number] DOB: 10/05/1965  Subjective: Shannon [name] is a 57 y.o. year old female who is a primary care patient of Shannon [name], [name], NP-C. The patient was referred to the Chronic Care Management team for assistance with care management needs subsequent to provider initiation of CCM services and plan of care.    Today's Visit:  Engaged with patient by telephone for follow up visit.        Goals Addressed             This Visit's Progress    Goal: CCM (Mixed Bipolar Disorder) Expected Outcome:  Monitor, Self-Manage And Reduce Symptoms of Mixed Bipolar Disorder       Current Barriers:  Chronic Disease Management support and education needs related to Mixed Bipolar Disorder Lacks caregiver support.  Corporate treasurer.  Transportation barriers  Planned Interventions: Discussed recent hospitalization. Hospitalized at [facility] on 08/28/22. Reports experiencing a fall in her bathroom a few days prior and notified EMS after several days of weakness, fatigue and limited ability to ambulate. Discussed concern related to patient leaving AMA. Confirmed that she was initially in agreement with plan to transition to a SNF for rehab and therapy. However, reports being "triggered" after speaking with the property manager regarding the condition of her rental unit. 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 Reports making several requests for the inpatient team to notify the clinic and care management team of her admission to discuss options but messages were not relayed. Thorough discussion regarding current plan and her ability to ambulate remains very limited and safety remains a primary concern. Denies falls since returning home but continues to experience weakness and muscle spasms. Thorough discussion  regarding safety and fall prevention measures. She is aware that Home Health PT/OT were not reordered, and the team will not be coming to the home. Agreed to update her friends and Investment banker, corporate of her decision to leave AMA so that they are aware that is in the unit and will need assistance. Notes symptoms r/t mental health are better today.  She remains very eager to continue plan for relocation in August. Reports having medications ordered by [name]/Psychiatry and taking as prescribed. Reports not having any of her medications ordered by her PCP. She is aware that she will need to complete her clinic visit prior to additional medications being ordered. Collaborated with PCP regarding hospitalization, plan for clinic visit and need for multiple prescriptions. A virtual appointment was scheduled by the [facility] nurse. PCP has requested that patient be evaluated in the clinic. Urgent referral was placed for transportation assistance. Appointment scheduled for 09/09/22. Reviewed resources and need to seek immediate medical attention or Crisis team if symptoms r/t major depression return and worsen. Update 09/04/22: Follow up regarding plan for clinic visit. Reports being contacted by the [employer] team regarding request for transportation assistance. Reports being provided with a contact number to for Medicaid and being informed she has access to free Medicaid transportation. Multiple attempts were made to schedule transportation with the provided information. Attempts to schedule transportation were unsuccessful. Agreed to follow up with the assigned [facility] Guide regarding need for additional transportation assistance. Update 09/08/22: Reports being advised by the Medicaid point of contact that she is not eligible for free Medicaid transportation. Reports pending  call back from the [facility] Guide regarding plan for transportation. Appointment is scheduled for  09/09/22. Update 09/09/22: Follow up regarding scheduled PCP appointment. Patient was not able to attend the appointment as scheduled d/t lack of transportation.  Collaborated with the clinic staff. Team agreed to schedule patient for a clinic visit/hospital follow-up on 09/14/22. Patient aware that her PCP will not be available and she agrees to be evaluated by another provider. Conference call with the DSS regarding Medicaid status. Staff confirmed that patient has not recently applied for Medicaid does not qualify for free Medicaid transportation. Advised that patient apply if additional Medicaid benefits are needed. Messaged [employer] regarding need for urgent transportation assistance. Informed Care Guide that patient does not qualify for free Medicaid transportation and will need assistance for the clinic visit on 09/14/22.  Update 09/10/22: Message received that previous referral for transportation was closed. New referral placed for urgent assistance with transportation. Clinic visit scheduled for 09/14/22. Update 09/11/22: Patient reports she will received update orders from [name] to ensure she has enough medication while out of the country.  Pending clinic visit with PCP on 09/14/22 for other prescriptions. Confirmed speaking with a [employer] today regarding urgent transportation assistance.    Symptom Management: Take medications as prescribed   Attend medical appointments as scheduled Call pharmacy for medication refills 3-7 days in advance of running out of medications Work with the care management team to address care coordination needs and will continue to work with the clinical team to address health care and disease management related needs Call provider office for new concerns or questions  Call the Suicide and Crisis Lifeline: 988 Call the [place] National Suicide Prevention Lifeline: 862-103-9245 or TTY: 903-339-0225 TTY 573 323 0469) to talk  to a trained counselor Call 1-800-273-TALK (toll free, 24 hour hotline) Go to [facility] Urgent Care [address] [place] [phone]) Call 911 if experiencing a Mental Health or Behavioral Health Crisis           PLAN: Will follow up within the next week.   [facility]/Chronic Care Management [phone]

## 2022-09-11 NOTE — Telephone Encounter (Signed)
Telephone encounter was:  Successful.  09/11/2022 Name: Shannon [name] MRN: [id number] DOB: [birth year]  Shannon [name] is a 57 y.o. year old female who is a primary care patient of Suezanne Jacquet, [name], NP-C . The community resource team was consulted for assistance with Transportation Needs   Care guide performed the following interventions: Patient provided with information about care guide support team and interviewed to confirm resource needs.Booked a ride with Denyse Amass for Monday appt will follow up if it gets accepted monday am , Waiver signed   Follow Up Plan:  Care guide will follow up with patient by phone over the next day  Samar Dass [facility] -[facility], Population Health [phone] [address] [place] , [place] [address] Email : [email or web address]     Shannon [name] DOB: 12-17-1965 MRN: [id number]   RIDER WAIVER AND RELEASE OF LIABILITY  For purposes of improving physical access to our facilities, [facility] is pleased to partner with third parties to provide [facility] patients or other authorized individuals the option of convenient, on-demand ground transportation services (the Chiropractor") through use of the technology service that enables users to request on-demand ground transportation from independent third-party providers.  By opting to use and accept these [employer], I, the undersigned, hereby agree on behalf of myself, and on behalf of any minor child using the Science writer for whom I am the parent or legal guardian, as follows:  Science writer provided to me are provided by independent third-party transportation providers who are not [employer] or employees and who are unaffiliated with [employer]. [facility] is neither a transportation carrier nor a common or public carrier. [facility] has no control over the quality or safety of the transportation that occurs as a result of the [employer]. [facility] cannot guarantee that any third-party transportation provider will complete any arranged transportation service. [facility] makes no representation, warranty, or guarantee regarding the reliability, timeliness, quality, safety, suitability, or availability of any of the Transport Services or that they will be error free. I fully understand that traveling by vehicle involves risks and dangers of serious bodily injury, including permanent disability, paralysis, and death. I agree, on behalf of myself and on behalf of any minor child using the Transport Services for whom I am the parent or legal guardian, that the entire risk arising out of my use of the [employer] remains solely with me, to the maximum extent permitted under applicable law. The [employer] are provided "as is" and "as available." [facility] disclaims all representations and warranties, express, implied or statutory, not expressly set out in these terms, including the implied warranties of merchantability and fitness for a particular purpose. I hereby waive and release [facility], its agents, employees, officers, directors, representatives, insurers, attorneys, assigns, successors, subsidiaries, and affiliates from any and all past, present, or future claims, demands, liabilities, actions, causes of action, or suits of any kind directly or indirectly arising from acceptance and use of the [employer]. I further waive and release [facility] and its affiliates from all present and future liability and responsibility for any injury or death to persons or damages to property caused by or related to the use of the [employer]. I have read this Waiver and Release of Liability, and I understand the terms used in it and their legal significance. This Waiver is freely and voluntarily given with the understanding that my right (as well  as the right of any minor child for whom I am the parent or legal  guardian using the [employer]) to legal recourse against [facility] in connection with the [employer] is knowingly surrendered in return for use of these services.   I attest that I read the consent document to [name], gave Ms. Heckstall the opportunity to ask questions and answered the questions asked (if any). I affirm that [name] then provided consent for she's participation in this program.     Dione Booze

## 2022-09-11 NOTE — Progress Notes (Signed)
  Care Coordination   Note   09/11/2022 Name: Shannon [name] MRN: [id number] DOB: [birth year]  Shannon [name] is a 57 y.o. year old female who sees [place], [place], NP-C for primary care. I reached out to [name] by phone today to offer care coordination services.  [name] was given information about Care Coordination services today including:   The Care Coordination services include support from the care team which includes your Nurse Coordinator, Clinical Social Worker, or Pharmacist.  The Care Coordination team is here to help remove barriers to the health concerns and goals most important to you. Care Coordination services are voluntary, and the patient may decline or stop services at any time by request to their care team member.   Care Coordination Consent Status: Patient agreed to services and verbal consent obtained.   Follow up plan:  Telephone appointment with care coordination team member scheduled for:  09/14/2022  Encounter Outcome:  Pt. Scheduled from referral   Burman Nieves, [facility] Care Coordination Care Guide Direct Dial: [phone]

## 2022-09-14 ENCOUNTER — Telehealth: Payer: Self-pay | Admitting: *Deleted

## 2022-09-14 ENCOUNTER — Ambulatory Visit: Payer: Medicare Other | Admitting: Family Medicine

## 2022-09-14 ENCOUNTER — Ambulatory Visit: Payer: Self-pay

## 2022-09-14 NOTE — Patient Outreach (Signed)
  Care Coordination   09/14/2022 Name: Shannon [name] MRN: [id number] DOB: 07-Jul-1965   Care Coordination Outreach Attempts:  An unsuccessful telephone outreach was attempted for a scheduled appointment today.  Follow Up Plan:  Additional outreach attempts will be made to offer the patient care coordination information and services.   Encounter Outcome:  No Answer   Care Coordination Interventions:  No, not indicated    SIG Lysle Morales, BSW Social Worker [facility] Care Management  [phone]

## 2022-09-14 NOTE — Telephone Encounter (Signed)
   Telephone encounter was:  Unsuccessful.  09/14/2022 Name: Shannon [name] MRN: [id number] DOB: 1965/08/04  Unsuccessful outbound call made today to assist with:  Transportation Needs  Unfortunately, we are not able to accommodate the  Standard Vehicle (Not Benedetto Goad Or Lyft) transport for  [name] on 09/14/22.  We apologize for this inconvenience.  Outreach Attempt:  2nd Attempt  No voicemail   Blaize Epple [facility] -[facility], Population Health [phone] [address] [place] , [place] [address] Email : [email or web address]

## 2022-09-15 ENCOUNTER — Ambulatory Visit: Payer: Medicare Other | Admitting: Neurology

## 2022-09-18 ENCOUNTER — Telehealth: Payer: Self-pay | Admitting: *Deleted

## 2022-09-18 NOTE — Progress Notes (Signed)
  Care Coordination Note  09/18/2022 Name: Shannon [name] MRN: [id number] DOB: 30-Apr-1965  Shannon [name] is a 57 y.o. year old female who is a primary care patient of Avanell Shackleton, NP-C and is actively engaged with the care management team. I reached out to [name] by phone today to assist with re-scheduling an initial visit with the BSW  Follow up plan: Patient declines further follow up and engagement by the care management team. Appropriate care team members and provider have been notified via electronic communication.   Burman Nieves, CCMA Care Coordination Care Guide Direct Dial: [phone]

## 2022-09-21 ENCOUNTER — Ambulatory Visit: Payer: Medicare Other

## 2022-09-21 DIAGNOSIS — F3160 Bipolar disorder, current episode mixed, unspecified: Secondary | ICD-10-CM

## 2022-09-23 ENCOUNTER — Telehealth: Payer: Medicare Other

## 2022-09-23 DIAGNOSIS — I10 Essential (primary) hypertension: Secondary | ICD-10-CM

## 2022-09-23 DIAGNOSIS — F3160 Bipolar disorder, current episode mixed, unspecified: Secondary | ICD-10-CM

## 2022-09-24 NOTE — Chronic Care Management (AMB) (Signed)
Error Please Disregard

## 2022-09-24 NOTE — Chronic Care Management (AMB) (Signed)
Chronic Care Management   CCM RN Visit Note   Name: Shannon [name] MRN: [id number] DOB: Mar 24, 1965  Subjective: Shannon [name] is a 57 y.o. year old female who is a primary care patient of Suezanne Jacquet, [name], NP-C. The patient was referred to the Chronic Care Management team for assistance with care management needs subsequent to provider initiation of CCM services and plan of care.    Today's Visit:  Engaged with patient by telephone for follow up visit.        Goals Addressed             This Visit's Progress    Goal: CCM (Hypertension) Expected Outcome: Monitor, Self-Manage and Reduce Symptoms of Hypertension       Current Barriers:  Chronic Disease Management support and education needs related to Hypertension Lacks caregiver support.  Corporate treasurer.  Transportation barriers  Planned Interventions: Reviewed plan for management of hypertension.  Reports taking medications as prescribed. Confirmed that spouse is currently assisting with obtaining prescriptions from the pharmacy as needed. Denies concerns related to prescription cost.  Reviewed established blood pressure parameters. Reports not monitoring consistently but BP is being monitored by the Physical Therapy team during home visits. Reports readings being within range. Reviewed symptoms. Reports some episodes of palpitations during therapy d/t deconditioning. Notes episodes resolve with rest. Denies episodes of chest discomfort. Denies headaches, visual changes, or shortness of breath.  Reviewed compliance with recommended cardiac prudent diet. Reports some improvement with appetite but still eating mostly small meals. Reports monitoring sodium intake as advised. Reports occasional intake of highly processed foods such as pizza but attempting to prepare small meals in the home. Advised to continue reading nutrition labels and monitoring sodium intake. Advised to keep the care management team updated if assistance  is required with meals.  Reviewed s/sx of heart attack, stroke and worsening symptoms that require immediate medical attention.   Symptom Management: Take medications as prescribed   Attend all scheduled provider appointments Call pharmacy for medication refills 3-7 days in advance of running out of medications Call provider office for new concerns or questions  Continue to work with the care management team to address care coordination needs. Seek immediate medical attention for worsening symptoms   Follow Up Plan:  Will update next month       Goal: CCM (Mixed Bipolar Disorder) Expected Outcome:  Monitor, Self-Manage And Reduce Symptoms of Mixed Bipolar Disorder       Current Barriers:  Chronic Disease Management support and education needs related to Mixed Bipolar Disorder Lacks caregiver support.  Corporate treasurer.  Transportation barriers  Planned Interventions: Reviewed plan for treatment of Mixed Bipolar Disorder. Reviewed symptoms. Reports doing well today. Reports change in plans for housing and relocation. After discussing at length with her family, she has opted to remain in [place] and plan to relocate in August.  Reports family would only be available to assist if she was willing to move to the downtown [place] area. Notes housing was not readily available in that area. Notes distance would be an issue and support would be limited in other areas of [place]. Reports current plan is to reevaluate options and reconsider areas to relocate in August. Confirmed that current Property Management is aware and agrees with this plan. Discussed plan for additional safety devices in the home d/t her likely being in the condo longer than expected. Confirmed speaking with the PT and OT team. Occupational Therapist/Grace will evaluate and submit request for equipment  to the PCP.   Symptom Management: Take medications as prescribed   Attend all scheduled provider  appointments Call pharmacy for medication refills 3-7 days in advance of running out of medications Work with the care management team to address care coordination needs and will continue to work with the clinical team to address health care and disease management related needs Call provider office for new concerns or questions  Call the Suicide and Crisis Lifeline: 988 Call the [place] National Suicide Prevention Lifeline: (707)279-1891 or TTY: 937 386 4669 TTY (279)437-9200) to talk to a trained counselor Call 1-800-273-TALK (toll free, 24 hour hotline) Go to [facility] [address] [place] [phone]) Call 911 if experiencing a Mental Health or Behavioral Health Crisis   Follow Up Plan: Will follow up this month        PLAN: A member of the care management team will follow up this month.  [facility]/Chronic Care Management [phone]

## 2022-09-24 NOTE — Chronic Care Management (AMB) (Signed)
Chronic Care Management   CCM RN Visit Note   Name: Shannon [name] MRN: [id number] DOB: 1965-12-24  Subjective: Shannon [name] is a 57 y.o. year old female who is a primary care patient of Suezanne Jacquet, [name], NP-C. The patient was referred to the Chronic Care Management team for assistance with care management needs subsequent to provider initiation of CCM services and plan of care.    Today's Visit:  Engaged with patient by telephone for follow up visit.        Goals Addressed             This Visit's Progress    Goal: CCM (Hypertension) Expected Outcome: Monitor, Self-Manage and Reduce Symptoms of Hypertension       Current Barriers:  Chronic Disease Management support and education needs related to Hypertension Lacks caregiver support.  Corporate treasurer.  Transportation barriers  Planned Interventions: Reviewed plan for management of hypertension.  Reports taking medications as prescribed. Denies concerns related to prescription cost.  Reviewed established blood pressure parameters. Her BP has not been monitored recently d/t rescheduling Home Health PT and OT sessions. Plans to contact her insurance provider regarding possibility of obtaining a BP monitor free of charge.  Denies chest pain, palpitations or dizziness. Denies headaches or visual changes. Reports significant improvement in ability to ambulate. Continues to experience chronic neuropathy but overall feels that she is getting stronger and progressing.  Discussed nutritional intake. Reports significant improvement with appetite and meal preparation. Reports preparing several meals in advance as opposed to ordering fast food. Reports spouse is still available and will assist with delivering grocery items if needed.  Reviewed s/sx of heart attack, stroke and worsening symptoms that require immediate medical attention.  Symptom Management: Take medications as prescribed   Attend all scheduled provider  appointments Follow up regarding availability of a BP monitor and monitor at least a few times a week. Maintain a BP log Call pharmacy for medication refills 3-7 days in advance of running out of medications Call provider office for new concerns or questions  Continue to work with the care management team to address care coordination needs. Seek immediate medical attention for worsening symptoms   Follow Up Plan:  Will update next month       Goal: CCM (Mixed Bipolar Disorder) Expected Outcome:  Monitor, Self-Manage And Reduce Symptoms of Mixed Bipolar Disorder       Current Barriers:  Chronic Disease Management support and education needs related to Mixed Bipolar Disorder Lacks caregiver support.  Corporate treasurer.  Transportation barriers  Planned Interventions: Reviewed plan for treatment of Mixed Bipolar Disorder. Reports doing very well since the last outreach. Reports being required to attend court over the past week.  Reports overcoming a huge hurdle by ambulating safely up and down the stairs of her condo complex. Reports being very pleased with progress and ability to ambulate. Feels that she is getting stronger. Reports not attending PT this week d/t court dates but otherwise adhering to prescribed treatment plans and taking medications as prescribed. No current changes in plan for housing. Reports keeping her Property Manager/Jaleela updated of all communications r/t housing.    Symptom Management: Take medications as prescribed   Attend medical appointments as scheduled Call pharmacy for medication refills 3-7 days in advance of running out of medications Work with the care management team to address care coordination needs and will continue to work with the clinical team to address health care and disease management related needs Call  provider office for new concerns or questions  Call the Suicide and Crisis Lifeline: 988 Call the [place] National Suicide Prevention  Lifeline: (301) 574-7448 or TTY: 702-244-7769 TTY 626-522-7830) to talk to a trained counselor Call 1-800-273-TALK (toll free, 24 hour hotline) Go to [facility] [address] [place] [phone]) Call 911 if experiencing a Mental Health or Behavioral Health Crisis   Follow Up Plan: Will follow up next month        PLAN: Will follow up within the next month   [facility]/Chronic Care Management [phone]

## 2022-09-25 ENCOUNTER — Telehealth: Payer: Self-pay

## 2022-09-25 NOTE — Chronic Care Management (AMB) (Signed)
Chronic Care Management   CCM RN Visit Note  Name: Shannon [name] MRN: [id number] DOB: November 21, 1965  Subjective: Shannon [name] is a 57 y.o. year old female who is a primary care patient of Suezanne Jacquet, [name], NP-C. The patient was referred to the Chronic Care Management team for assistance with care management needs subsequent to provider initiation of CCM services and plan of care.    Today's Visit:  Engaged with patient by telephone for follow up visit.        Goals Addressed             This Visit's Progress    Goal: CCM (Mixed Bipolar Disorder) Expected Outcome:  Monitor, Self-Manage And Reduce Symptoms of Mixed Bipolar Disorder       Current Barriers:  Chronic Disease Management support and education needs related to Mixed Bipolar Disorder Lacks caregiver support.  Corporate treasurer.  Transportation barriers  Planned Interventions: Discussed recent hospitalization. Hospitalized at [facility] on 08/28/22. Reports experiencing a fall in her bathroom a few days prior and notified EMS after several days of weakness, fatigue and limited ability to ambulate. Discussed concern related to patient leaving AMA. Confirmed that she was initially in agreement with plan to transition to a SNF for rehab and therapy. However, reports being "triggered" after speaking with the property manager regarding the condition of her rental unit. 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 Reports making several requests for the inpatient team to notify the clinic and care management team of her admission to discuss options but messages were not relayed. Thorough discussion regarding current plan and her ability to ambulate remains very limited and safety remains a primary concern. Denies falls since returning home but continues to experience weakness and muscle spasms. Thorough discussion regarding  safety and fall prevention measures. She is aware that Home Health PT/OT were not reordered, and the team will not be coming to the home. Agreed to update her friends and Investment banker, corporate of her decision to leave AMA so that they are aware that is in the unit and will need assistance. Notes symptoms r/t mental health are better today.  She remains very eager to continue plan for relocation in August. Reports having medications ordered by [name]/Psychiatry and taking as prescribed. Reports not having any of her medications ordered by her PCP. She is aware that she will need to complete her clinic visit prior to additional medications being ordered. Collaborated with PCP regarding hospitalization, plan for clinic visit and need for multiple prescriptions. A virtual appointment was scheduled by the [facility] nurse. PCP has requested that patient be evaluated in the clinic. Urgent referral was placed for transportation assistance. Appointment scheduled for 09/09/22. Reviewed resources and need to seek immediate medical attention or Crisis team if symptoms r/t major depression return and worsen. Update 09/04/22: Follow up regarding plan for clinic visit. Reports being contacted by the [employer] team regarding request for transportation assistance. Reports being provided with a contact number to for Medicaid and being informed she has access to free Medicaid transportation. Multiple attempts were made to schedule transportation with the provided information. Attempts to schedule transportation were unsuccessful. Agreed to follow up with the assigned [facility] Guide regarding need for additional transportation assistance. Update 09/08/22: Reports being advised by the Medicaid point of contact that she is not eligible for free Medicaid transportation. Reports pending call  back from the [facility] Guide regarding plan for transportation. Appointment is scheduled for 09/09/22. Update  09/09/22: Follow up regarding scheduled PCP appointment. Patient was not able to attend the appointment as scheduled d/t lack of transportation.  Collaborated with the clinic staff. Team agreed to schedule patient for a clinic visit/hospital follow-up on 09/14/22. Patient aware that her PCP will not be available and she agrees to be evaluated by another provider. Conference call with the DSS regarding Medicaid status. Staff confirmed that patient has not recently applied for Medicaid does not qualify for free Medicaid transportation. Advised that patient apply if additional Medicaid benefits are needed. Messaged [employer] regarding need for urgent transportation assistance. Informed Care Guide that patient does not qualify for free Medicaid transportation and will need assistance for the clinic visit on 09/14/22.  Update 09/10/22: Message received that previous referral for transportation was closed. New referral placed for urgent assistance with transportation. Clinic visit scheduled for 09/14/22. Update 09/11/22: Patient reports she will received update orders from [name] to ensure she has enough medication while out of the country.  Pending clinic visit with PCP on 09/14/22 for other prescriptions. Confirmed speaking with a [employer] today regarding urgent transportation assistance.  Update 09/21/22: Patient was unable to attend appointment as scheduled on 09/14/22. Message was received from Care Guide that the transportation provider/Kaizan was unable to reach the patient to arrange transportation. Discussed plan regarding medications. She is scheduled to leave the country within the next week. Reports having needed Psych prescriptions. Reports her hosting family in [place] has made arrangements for her to be evaluated by a provider for other medications. She was instructed to bring a copy of her written prescriptions as well as pill bottles to ensure she remains.   Thorough discussion regarding support and travel plan. Reports local friends and Investment banker, corporate remain very supportive. Information was provided for TSA and RDU airport assistance to ensure she has needed assistance with security and boarding.  Confirmed communicating with the hosting family. Reports arrangements have been made to receive all needed assistance after she arrives in [place]. Denies urgent concerns. Reports feeling well and very excited about her upcoming relocation. Agreed to call for questions or urgent care management needs if required.   Symptom Management: Take medications as prescribed   Attend medical appointments as scheduled Call pharmacy for medication refills 3-7 days in advance of running out of medications Work with the care management team to address care coordination needs and will continue to work with the clinical team to address health care and disease management related needs Call provider office for new concerns or questions  Call the Suicide and Crisis Lifeline: 988 Call the [place] National Suicide Prevention Lifeline: 820-659-5628 or TTY: 661 040 8001 TTY (954) [phone]) to talk to a trained counselor Call 1-800-273-TALK (toll free, 24 hour hotline) Go to [facility] [address] [place] [phone]) Call 911 if experiencing a Mental Health or Behavioral Health Crisis          PLAN Will touch base within the next week   [facility]/Chronic Care Management [phone]

## 2022-09-25 NOTE — Chronic Care Management (AMB) (Signed)
Chronic Care Management   CCM RN Visit Note   Name: Shannon [name] MRN: [id number] DOB: 09-14-1965  Subjective: Shannon [name] is a 57 y.o. year old female who is a primary care patient of Suezanne Jacquet, [name], NP-C. The patient was referred to the Chronic Care Management team for assistance with care management needs subsequent to provider initiation of CCM services and plan of care.    Today's Visit:  Engaged with patient by telephone for follow up visit.        Goals Addressed             This Visit's Progress    Goal: CCM (Mixed Bipolar Disorder) Expected Outcome:  Monitor, Self-Manage And Reduce Symptoms of Mixed Bipolar Disorder       Current Barriers:  Chronic Disease Management support and education needs related to Mixed Bipolar Disorder Lacks caregiver support.  Corporate treasurer.  Transportation barriers  Planned Interventions: Discussed recent hospitalization. Hospitalized at [facility] on 08/28/22. Reports experiencing a fall in her bathroom a few days prior and notified EMS after several days of weakness, fatigue and limited ability to ambulate. Discussed concern related to patient leaving AMA. Confirmed that she was initially in agreement with plan to transition to a SNF for rehab and therapy. However, reports being "triggered" after speaking with the property manager regarding the condition of her rental unit. 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 Reports making several requests for the inpatient team to notify the clinic and care management team of her admission to discuss options but messages were not relayed. Thorough discussion regarding current plan and her ability to ambulate remains very limited and safety remains a primary concern. Denies falls since returning home but continues to experience weakness and muscle spasms. Thorough discussion regarding  safety and fall prevention measures. She is aware that Home Health PT/OT were not reordered, and the team will not be coming to the home. Agreed to update her friends and Investment banker, corporate of her decision to leave AMA so that they are aware that is in the unit and will need assistance. Notes symptoms r/t mental health are better today.  She remains very eager to continue plan for relocation in August. Reports having medications ordered by [name]/Psychiatry and taking as prescribed. Reports not having any of her medications ordered by her PCP. She is aware that she will need to complete her clinic visit prior to additional medications being ordered. Collaborated with PCP regarding hospitalization, plan for clinic visit and need for multiple prescriptions. A virtual appointment was scheduled by the [facility] nurse. PCP has requested that patient be evaluated in the clinic. Urgent referral was placed for transportation assistance. Appointment scheduled for 09/09/22. Reviewed resources and need to seek immediate medical attention or Crisis team if symptoms r/t major depression return and worsen. Update 09/04/22: Follow up regarding plan for clinic visit. Reports being contacted by the [employer] team regarding request for transportation assistance. Reports being provided with a contact number to for Medicaid and being informed she has access to free Medicaid transportation. Multiple attempts were made to schedule transportation with the provided information. Attempts to schedule transportation were unsuccessful. Agreed to follow up with the assigned [facility] Guide regarding need for additional transportation assistance.   Symptom Management: Take medications as prescribed   Attend medical appointments as scheduled Call pharmacy for medication refills 3-7 days  in advance of running out of medications Work with the care management team to address care coordination needs and will continue to  work with the clinical team to address health care and disease management related needs Call provider office for new concerns or questions  Call the Suicide and Crisis Lifeline: 988 Call the [place] National Suicide Prevention Lifeline: 202-816-7502 or TTY: 316-056-0822 TTY 231 026 5366) to talk to a trained counselor Call 1-800-273-TALK (toll free, 24 hour hotline) Go to [facility] [address] [place] [phone]) Call 911 if experiencing a Mental Health or Behavioral Health Crisis          PLAN: Will follow up next week.   [facility]/Chronic Care Management [phone]

## 2022-09-25 NOTE — Chronic Care Management (AMB) (Signed)
Chronic Care Management   CCM RN Visit Note   Name: Shannon [name] MRN: [id number] DOB: November 04, 1965  Subjective: Shannon [name] is a 57 y.o. year old female who is a primary care patient of Suezanne Jacquet, [name], NP-C. The patient was referred to the Chronic Care Management team for assistance with care management needs subsequent to provider initiation of CCM services and plan of care.    Today's Visit:  Engaged with patient by telephone for follow up visit.        Goals Addressed             This Visit's Progress    Goal: CCM (Mixed Bipolar Disorder) Expected Outcome:  Monitor, Self-Manage And Reduce Symptoms of Mixed Bipolar Disorder       Current Barriers:  Chronic Disease Management support and education needs related to Mixed Bipolar Disorder Lacks caregiver support.  Corporate treasurer.  Transportation barriers  Planned Interventions: Discussed recent hospitalization. Hospitalized at [facility] on 08/28/22. Reports experiencing a fall in her bathroom a few days prior and notified EMS after several days of weakness, fatigue and limited ability to ambulate. Discussed concern related to patient leaving AMA. Confirmed that she was initially in agreement with plan to transition to a SNF for rehab and therapy. However, reports being "triggered" after speaking with the property manager regarding the condition of her rental unit. 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 Reports making several requests for the inpatient team to notify the clinic and care management team of her admission to discuss options but messages were not relayed. Thorough discussion regarding current plan and her ability to ambulate remains very limited and safety remains a primary concern. Denies falls since returning home but continues to experience weakness and muscle spasms. Thorough discussion regarding  safety and fall prevention measures. She is aware that Home Health PT/OT were not reordered, and the team will not be coming to the home. Agreed to update her friends and Investment banker, corporate of her decision to leave AMA so that they are aware that is in the unit and will need assistance. Notes symptoms r/t mental health are better today.  She remains very eager to continue plan for relocation in August. Reports having medications ordered by [name]/Psychiatry and taking as prescribed. Reports not having any of her medications ordered by her PCP. She is aware that she will need to complete her clinic visit prior to additional medications being ordered. Collaborated with PCP regarding hospitalization, plan for clinic visit and need for multiple prescriptions. A virtual appointment was scheduled by the [facility] nurse. PCP has requested that patient be evaluated in the clinic. Urgent referral was placed for transportation assistance. Appointment scheduled for 09/09/22. Reviewed resources and need to seek immediate medical attention or Crisis team if symptoms r/t major depression return and worsen. Update 09/04/22: Follow up regarding plan for clinic visit. Reports being contacted by the [employer] team regarding request for transportation assistance. Reports being provided with a contact number to for Medicaid and being informed she has access to free Medicaid transportation. Multiple attempts were made to schedule transportation with the provided information. Attempts to schedule transportation were unsuccessful. Agreed to follow up with the assigned [facility] Guide regarding need for additional transportation assistance. Update 09/08/22: Reports being advised by the Medicaid point of contact that she is not eligible for free Medicaid transportation. Reports pending  call back from the [facility] Guide regarding plan for transportation. Appointment is scheduled for 09/09/22. Update  09/09/22: Follow up regarding scheduled PCP appointment. Patient was not able to attend the appointment as scheduled d/t lack of transportation.  Collaborated with the clinic staff. Team agreed to schedule patient for a clinic visit/hospital follow-up on 09/14/22. Patient aware that her PCP will not be available and she agrees to be evaluated by another provider. Conference call with the DSS regarding Medicaid status. Staff confirmed that patient has not recently applied for Medicaid does not qualify for free Medicaid transportation. Advised that patient apply if additional Medicaid benefits are needed. Messaged [employer] regarding need for urgent transportation assistance. Informed Care Guide that patient does not qualify for free Medicaid transportation and will need assistance for the clinic visit on 09/14/22.  Update 09/10/22: Message received that previous referral for transportation was closed. New referral placed for urgent assistance with transportation. Clinic visit scheduled for 09/14/22.   Symptom Management: Take medications as prescribed   Attend medical appointments as scheduled Call pharmacy for medication refills 3-7 days in advance of running out of medications Work with the care management team to address care coordination needs and will continue to work with the clinical team to address health care and disease management related needs Call provider office for new concerns or questions  Call the Suicide and Crisis Lifeline: 988 Call the [place] National Suicide Prevention Lifeline: 334-167-8825 or TTY: 304-068-1615 TTY (440) 839-1492) to talk to a trained counselor Call 1-800-273-TALK (toll free, 24 hour hotline) Go to [facility] [address] [place] [phone]) Call 911 if experiencing a Mental Health or Behavioral Health Crisis          PLAN: Will follow up within the next week.   [facility]  Health/Chronic Care Management [phone]

## 2022-09-25 NOTE — Chronic Care Management (AMB) (Signed)
Chronic Care Management   CCM RN Visit Note   Name: Shannon [name] MRN: [id number] DOB: Jul 06, 1965  Subjective: Shannon [name] is a 57 y.o. year old female who is a primary care patient of Suezanne Jacquet, [name], NP-C. The patient was referred to the Chronic Care Management team for assistance with care management needs subsequent to provider initiation of CCM services and plan of care.    Today's Visit:  Engaged with patient by telephone for follow up visit.        Goals Addressed             This Visit's Progress    Goal: CCM (Mixed Bipolar Disorder) Expected Outcome:  Monitor, Self-Manage And Reduce Symptoms of Mixed Bipolar Disorder       Current Barriers:  Chronic Disease Management support and education needs related to Mixed Bipolar Disorder Lacks caregiver support.  Corporate treasurer.  Transportation barriers  Planned Interventions: Discussed recent hospitalization. Hospitalized at [facility] on 08/28/22. Reports experiencing a fall in her bathroom a few days prior and notified EMS after several days of weakness, fatigue and limited ability to ambulate. Discussed concern related to patient leaving AMA. Confirmed that she was initially in agreement with plan to transition to a SNF for rehab and therapy. However, reports being "triggered" after speaking with the property manager regarding the condition of her rental unit. 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 Reports making several requests for the inpatient team to notify the clinic and care management team of her admission to discuss options but messages were not relayed. Thorough discussion regarding current plan and her ability to ambulate remains very limited and safety remains a primary concern. Denies falls since returning home but continues to experience weakness and muscle spasms. Thorough discussion regarding  safety and fall prevention measures. She is aware that Home Health PT/OT were not reordered, and the team will not be coming to the home. Agreed to update her friends and Investment banker, corporate of her decision to leave AMA so that they are aware that is in the unit and will need assistance. Notes symptoms r/t mental health are better today.  She remains very eager to continue plan for relocation in August. Reports having medications ordered by [name]/Psychiatry and taking as prescribed. Reports not having any of her medications ordered by her PCP. She is aware that she will need to complete her clinic visit prior to additional medications being ordered. Collaborated with PCP regarding hospitalization, plan for clinic visit and need for multiple prescriptions. A virtual appointment was scheduled by the [facility] nurse. PCP has requested that patient be evaluated in the clinic. Urgent referral was placed for transportation assistance. Appointment scheduled for 09/09/22. Reviewed resources and need to seek immediate medical attention or Crisis team if symptoms r/t major depression return and worsen. Update 09/04/22: Follow up regarding plan for clinic visit. Reports being contacted by the [employer] team regarding request for transportation assistance. Reports being provided with a contact number to for Medicaid and being informed she has access to free Medicaid transportation. Multiple attempts were made to schedule transportation with the provided information. Attempts to schedule transportation were unsuccessful. Agreed to follow up with the assigned [facility] Guide regarding need for additional transportation assistance. Update 09/08/22: Reports being advised by the Medicaid point of contact that she is not eligible for free Medicaid transportation. Reports pending  call back from the [facility] Guide regarding plan for transportation. Appointment is scheduled for 09/09/22.   Symptom  Management: Take medications as prescribed   Attend medical appointments as scheduled Call pharmacy for medication refills 3-7 days in advance of running out of medications Work with the care management team to address care coordination needs and will continue to work with the clinical team to address health care and disease management related needs Call provider office for new concerns or questions  Call the Suicide and Crisis Lifeline: 988 Call the [place] National Suicide Prevention Lifeline: (425)219-3407 or TTY: 623-395-8284 TTY 440-528-8798) to talk to a trained counselor Call 1-800-273-TALK (toll free, 24 hour hotline) Go to [facility] [address] [place] [phone]) Call 911 if experiencing a Mental Health or Behavioral Health Crisis          PLAN: Will follow up this week   [facility]/Chronic Care Management [phone]

## 2022-09-25 NOTE — Chronic Care Management (AMB) (Signed)
Chronic Care Management   CCM RN Visit Note     Name: Shannon [name] MRN: [id number] DOB: Jul 04, 1965  Subjective: Shannon [name] is a 57 y.o. year old female who is a primary care patient of Suezanne Jacquet, [name], NP-C. The patient was referred to the Chronic Care Management team for assistance with care management needs subsequent to provider initiation of CCM services and plan of care.    Today's Visit:  Engaged with patient by telephone for follow up visit.        Goals Addressed             This Visit's Progress    Goal: CCM (Mixed Bipolar Disorder) Expected Outcome:  Monitor, Self-Manage And Reduce Symptoms of Mixed Bipolar Disorder       Current Barriers:  Chronic Disease Management support and education needs related to Mixed Bipolar Disorder Lacks caregiver support.  Corporate treasurer.  Transportation barriers  Planned Interventions: Discussed recent hospitalization. Hospitalized at [facility] on 08/28/22. Reports experiencing a fall in her bathroom a few days prior and notified EMS after several days of weakness, fatigue and limited ability to ambulate. Discussed concern related to patient leaving AMA. Confirmed that she was initially in agreement with plan to transition to a SNF for rehab and therapy. However, reports being "triggered" after speaking with the property manager regarding the condition of her rental unit. 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 Reports making several requests for the inpatient team to notify the clinic and care management team of her admission to discuss options but messages were not relayed. Thorough discussion regarding current plan and her ability to ambulate remains very limited and safety remains a primary concern. Denies falls since returning home but continues to experience weakness and muscle spasms. Thorough discussion  regarding safety and fall prevention measures. She is aware that Home Health PT/OT were not reordered, and the team will not be coming to the home. Agreed to update her friends and Investment banker, corporate of her decision to leave AMA so that they are aware that is in the unit and will need assistance. Notes symptoms r/t mental health are better today.  She remains very eager to continue plan for relocation in August. Reports having medications ordered by [name]/Psychiatry and taking as prescribed. Reports not having any of her medications ordered by her PCP. She is aware that she will need to complete her clinic visit prior to additional medications being ordered. Collaborated with PCP regarding hospitalization, plan for clinic visit and need for multiple prescriptions. A virtual appointment was scheduled by the [facility] nurse. PCP has requested that patient be evaluated in the clinic. Urgent referral was placed for transportation assistance. Appointment scheduled for 09/09/22. Reviewed resources and need to seek immediate medical attention or Crisis team if symptoms r/t major depression return and worsen. Update 09/04/22: Follow up regarding plan for clinic visit. Reports being contacted by the [employer] team regarding request for transportation assistance. Reports being provided with a contact number to for Medicaid and being informed she has access to free Medicaid transportation. Multiple attempts were made to schedule transportation with the provided information. Attempts to schedule transportation were unsuccessful. Agreed to follow up with the assigned [facility] Guide regarding need for additional transportation assistance. Update 09/08/22: Reports being advised by the Medicaid point of contact that she is not eligible for free Medicaid transportation.  Reports pending call back from the [facility] Guide regarding plan for transportation. Appointment is scheduled for  09/09/22. Update 09/09/22: Follow up regarding scheduled PCP appointment. Patient was not able to attend the appointment as scheduled d/t lack of transportation.  Collaborated with the clinic staff. Team agreed to schedule patient for a clinic visit/hospital follow-up on 09/14/22. Patient aware that her PCP will not be available and she agrees to be evaluated by another provider. Conference call with the DSS regarding Medicaid status. Staff confirmed that patient has not recently applied for Medicaid does not qualify for free Medicaid transportation. Advised that patient apply if additional Medicaid benefits are needed. Messaged [employer] regarding need for urgent transportation assistance. Informed Care Guide that patient does not qualify for free Medicaid transportation and will need assistance for the clinic visit on 09/14/22.    Symptom Management: Take medications as prescribed   Attend medical appointments as scheduled Call pharmacy for medication refills 3-7 days in advance of running out of medications Work with the care management team to address care coordination needs and will continue to work with the clinical team to address health care and disease management related needs Call provider office for new concerns or questions  Call the Suicide and Crisis Lifeline: 988 Call the [place] National Suicide Prevention Lifeline: 219-733-1203 or TTY: 807-606-7211 TTY 506 168 4964) to talk to a trained counselor Call 1-800-273-TALK (toll free, 24 hour hotline) Go to [facility] Urgent Care [address], [place] [phone]) Call 911 if experiencing a Mental Health or Behavioral Health Crisis          PLAN: Will follow up this week   [facility]/Chronic Care Management [phone]

## 2022-09-28 NOTE — Patient Outreach (Signed)
  Care Management   Visit Note   Name: Shannon [name] MRN: [id number] DOB: Oct 12, 1965  Subjective: Shannon [name] is a 57 y.o. year old female who is a primary care patient of Suezanne Jacquet, [name], NP-C. The Care Management team was consulted for assistance.      An unsuccessful outreach was attempted today.   PLAN:  A HIPAA compliant phone message was left for the patient providing contact information and requesting a return call.     [facility]/Care Management [phone]

## 2022-10-01 ENCOUNTER — Other Ambulatory Visit: Payer: Self-pay

## 2022-11-12 ENCOUNTER — Telehealth (HOSPITAL_BASED_OUTPATIENT_CLINIC_OR_DEPARTMENT_OTHER): Payer: Medicare Other | Admitting: Psychiatry

## 2022-11-12 ENCOUNTER — Encounter (HOSPITAL_COMMUNITY): Payer: Self-pay | Admitting: Psychiatry

## 2022-11-12 DIAGNOSIS — F319 Bipolar disorder, unspecified: Secondary | ICD-10-CM

## 2022-11-12 DIAGNOSIS — F4310 Post-traumatic stress disorder, unspecified: Secondary | ICD-10-CM | POA: Diagnosis not present

## 2022-11-12 MED ORDER — FLUOXETINE HCL 20 MG PO CAPS
20.0000 mg | ORAL_CAPSULE | Freq: Every day | ORAL | 0 refills | Status: DC
Start: 2022-11-12 — End: 2023-01-28

## 2022-11-12 MED ORDER — CLONAZEPAM 0.5 MG PO TABS
0.5000 mg | ORAL_TABLET | Freq: Two times a day (BID) | ORAL | 0 refills | Status: DC | PRN
Start: 2022-11-12 — End: 2023-03-02

## 2022-11-12 MED ORDER — QUETIAPINE FUMARATE 300 MG PO TABS
300.0000 mg | ORAL_TABLET | Freq: Every day | ORAL | 0 refills | Status: DC
Start: 2022-11-12 — End: 2023-02-10

## 2022-11-12 MED ORDER — QUETIAPINE FUMARATE 25 MG PO TABS
25.0000 mg | ORAL_TABLET | Freq: Two times a day (BID) | ORAL | 0 refills | Status: DC
Start: 2022-11-12 — End: 2023-02-10
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
## 2022-12-02 NOTE — Patient Outreach (Signed)
  Care Management   Visit Note   Name: Shannon [name] MRN: [id number] DOB: 1965/09/22  Subjective: Shannon [name] is a 57 y.o. year old female who is a primary care patient of Suezanne Jacquet, [name], NP-C. The Care Management team was consulted for assistance.       Brief outreach with Shannon [name]. Confirms arriving safely in [place].  Anticipates being out of the country for approximately six months. The Care team will reengage when patient returns.  PLAN Will follow up when patient returns in six months.    [facility]  [facility], [facility] RN Care Manager Direct Dial: [phone] Website: [email or web address]

## 2022-12-15 ENCOUNTER — Encounter: Payer: Self-pay | Admitting: Licensed Clinical Social Worker

## 2022-12-15 NOTE — Patient Outreach (Signed)
  Care Management   Visit Note  12/15/2022 Name: Shannon [name] MRN: [id number] DOB: April 09, 1965  Subjective: Shannon [name] is a 57 y.o. year old female who is a primary care patient of Shannon [name], Shannon [name], Shannon [name]. The Care Management team was consulted for assistance.       Message received from Shannon [name]. Per message, she remains out of the country in [place]. Requested that care manager contact Shannon [name] (on [place]) regarding her current status and plan to return to [place].    Goals Addressed             This Visit's Progress    Care Management       Current Barriers:  Care Management support related to Mixed Bipolar Disorder, Hypertension and mobility limitations Lacks caregiver support.  Corporate treasurer.  Transportation barriers Housing needs  Planned Interventions: Outreach with Shannon [name] regarding [name]'s plan to return to the  [name] within the next few weeks if possible. Original plan was for Shannon [name] to remain in [place] for six months. She was due to return to the [name] in March. Reports that Shannon [name] currently has adequate housing in [place] however Shannon [name] expressed concerns that she does not have adequate support from the host family. Also expressed concerns regarding unavailability of certain medications. Per chart review, she has been completing virtual visits with her Psychiatrist.  Thorough discussion with Shannon [name] regarding anticipated needs. She will require extensive care management, caregiver support, financial support and housing assistance. Shannon [name] indicated that Shannon [name] will not be able to return to her previous rental due to the unit currently being rented by another family. Shannon [name] is currently contacting multiple housing and apartment managers in the [place], however most units may not be affordable to Shannon [name]/t limited income. Discussed challenge that age restricted/low income units have very lengthy wait list and will likely not be available during the time  frame expected. Reports also speaking with other families within their religious community. Unfortunately the families will not be able to accommodate Shannon [name] in their homes. Accommodations will be limited [name]/t her mobility limitations and difficulty ambulating up/down stairs and steps.  Reports Shannon [name]'s divorce is being finalized and she will not receive additional financial assistance from her spouse. Noted several community agencies that were previously providing assistance will not be available [name]/t exhausting funds.  Reports that Shannon [name] is safe in her current host home. Reasonable plan would be to remain in [place] until housing, financial assistance and caregiver support can be confirmed. Reports last resort would be placement in a shelter if she opts to return prior to housing being arranged.  Agreed to touch base with updates           PLAN:  Will update PCP Will follow up with Shannon [name] if she receives updates   Shannon [name] Health  [facility], [facility] Health RN Care Manager Direct Dial: [phone] Website: [email or web address]

## 2022-12-15 NOTE — Patient Outreach (Signed)
  Care Coordination   Collaboration  Visit Note   12/15/2022 Name: CALLI TEELE MRN: [id number] DOB: [birth year]  LIGIA SCANLON is a 57 y.o. year old female who sees [place], [place], NP-C for primary care. I  engaged with RNCM  What matters to the patients health and wellness today?  Patient was not engaged during this encounter   SDOH assessments and interventions completed:  No     Care Coordination Interventions:  Yes, provided   Follow up plan:  LCSW will continue to collaborate with [facility] and will reach out to patient with supportive resources    Encounter Outcome:  Patient Visit Completed   Jenel Lucks, MSW, LCSW [facility] Care Management [facility]  [facility] [email or web address] Phone [phone] 5:15 PM

## 2022-12-16 ENCOUNTER — Telehealth: Payer: Self-pay | Admitting: Family Medicine

## 2022-12-16 ENCOUNTER — Encounter: Payer: Self-pay | Admitting: Licensed Clinical Social Worker

## 2022-12-16 ENCOUNTER — Other Ambulatory Visit: Payer: Self-pay

## 2022-12-16 NOTE — Telephone Encounter (Signed)
Patient is returning from [place] due to physical and behavioral health concerns. She said she is checking into a behavioral health in-patient program due to depression and suicidal thoughts.  However, she said she is having issue with neuropathy in her hands and feet. She said she would like to speak with Dahlia Client about it to figure out what to do.  She said she is calling via Skype, so she said to try calling the number attached to that. She said if  a call was unable to be made that she would call back later. She does not currently have access to [employer] or Internet. Best callback is [phone].

## 2022-12-16 NOTE — Patient Outreach (Signed)
  Care Coordination   Collaborative  Visit Note   12/16/2022 Name: Shannon [name] MRN: [id number] DOB: [birth year]  Shannon [name] is a 57 y.o. year old female who sees [place], [place], NP-C for primary care. I  engaged with RNCM  What matters to the patients health and wellness today?  Patient was not engaged during this encounter by LCSW    SDOH assessments and interventions completed:  No     Care Coordination Interventions:  Yes, provided  Interventions Today    Flowsheet Row Most Recent Value  Chronic Disease   Chronic disease during today's visit Other  [Mixed Bipolar Disorder and Housing Instability]  General Interventions   General Interventions Discussed/Reviewed Communication with  Communication with RN  [LCSW received call from [facility] with to complete case review. Patient's hx of care, current barriers, and patient care needs discussed. RNCM will provide update as new information presents itself]       Follow up plan:  LCSW will f/up with RNCM this week    Encounter Outcome:  Patient Visit Completed   Jenel Lucks, MSW, LCSW [facility] Care Management [facility]  [facility] [email or web address] Phone [phone] 4:18 PM

## 2022-12-16 NOTE — Telephone Encounter (Signed)
Pt wants advise on what to do in regards to her neuropathy in hands and feet, unable to be seen here as she will be checking in to inpatient behavioral health once she arrives back to the [name]

## 2022-12-16 NOTE — Telephone Encounter (Signed)
ATC number below, was given message "call cannot be completed at this time"   Per [name], if she is going straight to in-patient when she gets here and is unable to make a visit with her, she will need to speak w her dr in the clinic that will be overseeing her care as Larene [place] has not seen pt in office since 2023.

## 2022-12-17 ENCOUNTER — Other Ambulatory Visit: Payer: Self-pay

## 2022-12-17 DIAGNOSIS — R531 Weakness: Secondary | ICD-10-CM | POA: Diagnosis not present

## 2022-12-17 DIAGNOSIS — H538 Other visual disturbances: Secondary | ICD-10-CM | POA: Diagnosis not present

## 2022-12-17 DIAGNOSIS — R000 Tachycardia, unspecified: Secondary | ICD-10-CM | POA: Diagnosis not present

## 2022-12-17 DIAGNOSIS — K219 Gastro-esophageal reflux disease without esophagitis: Secondary | ICD-10-CM | POA: Diagnosis not present

## 2022-12-17 DIAGNOSIS — R519 Headache, unspecified: Secondary | ICD-10-CM | POA: Diagnosis not present

## 2022-12-17 DIAGNOSIS — Z20822 Contact with and (suspected) exposure to covid-19: Secondary | ICD-10-CM | POA: Diagnosis not present

## 2022-12-17 DIAGNOSIS — I10 Essential (primary) hypertension: Secondary | ICD-10-CM | POA: Diagnosis not present

## 2022-12-17 DIAGNOSIS — Z79899 Other long term (current) drug therapy: Secondary | ICD-10-CM | POA: Diagnosis not present

## 2022-12-17 NOTE — Patient Outreach (Signed)
Care Management   Visit Note  12/17/2022 Name: Shannon [name] MRN: [id number] DOB: March 05, 1965  Subjective: Shannon [name] is a 57 y.o. year old female who is a primary care patient of Shannon [name], Shannon L, NP-C. The Care Management team was consulted for assistance.       Care Coordination:    Goals Addressed             This Visit's Progress    Care Management       Current Barriers:  Care Management support related to Mixed Bipolar Disorder, Hypertension and mobility limitations Lacks caregiver support.  Corporate treasurer.  Transportation barriers Housing needs  Planned Interventions: Outreach with [employer] regarding [name]'s plan to return to the  [name] within the next few weeks if possible. Original plan was for [name] to remain in [place] for six months. She was due to return to the [name] in March. Reports that [name] currently has adequate housing in [place] however [name] expressed concerns that she does not have adequate support from the host family. Also expressed concerns regarding unavailability of certain medications. Per chart review, she has been completing virtual visits with her Psychiatrist.  Thorough discussion with [name] regarding anticipated needs. She will require extensive care management, caregiver support, financial support and housing assistance. [name] indicated that [name] will not be able to return to her previous rental due to the unit currently being rented by another family. [name] is currently contacting multiple housing and apartment managers in the [place], however most units may not be affordable to [name] d/t limited income. Discussed challenge that age restricted/low income units have very lengthy wait list and will likely not be available during the time frame expected. Reports also speaking with other families within their religious community. Unfortunately the families will not be able to accommodate [name] in their homes.  Accommodations will be limited d/t her mobility limitations and difficulty ambulating up/down stairs and steps.  Reports [name]'s divorce is being finalized and she will not receive additional financial assistance from her spouse. Noted several community agencies that were previously providing assistance will not be available d/t exhausting funds.  Reports that [name] is safe in her current host home. Reasonable plan would be to remain in [place] until housing, financial assistance and caregiver support can be confirmed. Reports last resort would be placement in a shelter if she opts to return prior to housing being arranged.  Agreed to touch base with updates  Update: 12/16/22: Collaborated with LCSW regarding plan for [name]'s return. Informed today that she will be admitted to an inpatient Behavioral Health facility when she returns to the states. Per message, patient is expected to return on December 17, 2022.  Update: 12/17/22: Received update from [name]. Reports being available in [place] as planned however [name] relayed a message indicating her flight would not be arriving until much later today. [name] will not be available to return to [place] today to assist with transportation back to [place].  Discussed [name]'s plan to go directly to the ED in preparation for admission to an inpatient [facility] unit. She will advise [name] to report to the nearest ED in [place] to avoid delays in evaluation/treatment.              PLAN Will follow up with [name] on 12/18/22.   Katina Degree Health  [facility], [facility] Health RN Care Manager Dial: [phone] Website: [email or web address]

## 2022-12-17 NOTE — Patient Outreach (Signed)
  Care Management   Visit Note   Name: Shannon [name] MRN: [id number] DOB: 1965-12-02  Subjective: Shannon [name] is a 57 y.o. year old female who is a primary care patient of Shannon [name], Shannon L, NP-C. The Care Management team was consulted for assistance.         Goals Addressed             This Visit's Progress    Care Management       Current Barriers:  Care Management support related to Mixed Bipolar Disorder, Hypertension and mobility limitations Lacks caregiver support.  Corporate treasurer.  Transportation barriers Housing needs  Planned Interventions: Outreach with [employer] regarding Shannon [name]'s plan to return to the  [name] within the next few weeks if possible. Original plan was for Shannon [name] to remain in [place] for six months. She was due to return to the [name] in March. Reports that Shannon [name] currently has adequate housing in [place] however Shannon [name] expressed concerns that she does not have adequate support from the host family. Also expressed concerns regarding unavailability of certain medications. Per chart review, she has been completing virtual visits with her Psychiatrist.  Thorough discussion with Shannon [name] regarding anticipated needs. She will require extensive care management, caregiver support, financial support and housing assistance. Shannon [name] indicated that Shannon [name] will not be able to return to her previous rental due to the unit currently being rented by another family. Shannon [name] is currently contacting multiple housing and apartment managers in the [place], however most units may not be affordable to Shannon [name] d/t limited income. Discussed challenge that age restricted/low income units have very lengthy wait list and will likely not be available during the time frame expected. Reports also speaking with other families within their religious community. Unfortunately the families will not be able to accommodate Shannon [name] in their homes. Accommodations will be limited d/t  her mobility limitations and difficulty ambulating up/down stairs and steps.  Reports Shannon [name]'s divorce is being finalized and she will not receive additional financial assistance from her spouse. Noted several community agencies that were previously providing assistance will not be available d/t exhausting funds.  Reports that Shannon [name] is safe in her current host home. Reasonable plan would be to remain in [place] until housing, financial assistance and caregiver support can be confirmed. Reports last resort would be placement in a shelter if she opts to return prior to housing being arranged.  Agreed to touch base with updates  Update: 12/16/22: Collaborated with LCSW regarding plan for Shannon [name]'s return. Informed today that she will be admitted to an inpatient Behavioral Health facility when she returns to the states. Per message, patient is expected to return on December 17, 2022.             PLAN Will continue collaboration with LCSW Will follow up with support person/Shannon [name] regarding updates   Katina Degree Health  [facility], [facility] Health RN Care Manager Direct Dial: [phone] Website: [email or web address]

## 2022-12-18 ENCOUNTER — Other Ambulatory Visit: Payer: Self-pay

## 2022-12-18 NOTE — Patient Outreach (Signed)
Care Management   Visit Note  12/18/2022 Name: Shannon [name] MRN: [id number] DOB: 03-10-1965  Subjective: Shannon [name] is a 57 y.o. year old female who is a primary care patient of Suezanne Jacquet, [name], NP-C. The Care Management team was consulted for assistance.        Goals Addressed             This Visit's Progress    Care Management       Current Barriers:  Care Management support related to Mixed Bipolar Disorder, Hypertension and mobility limitations Lacks caregiver support.  Corporate treasurer.  Transportation barriers Housing needs  Planned Interventions: Outreach with [employer] regarding Ms. Arakelian's plan to return to the  [name] within the next few weeks if possible. Original plan was for Ms. Daya to remain in [place] for six months. She was due to return to the [name] in March. Reports that Ms. Arata currently has adequate housing in [place] however Ms. Mohs expressed concerns that she does not have adequate support from the host family. Also expressed concerns regarding unavailability of certain medications. Per chart review, she has been completing virtual visits with her Psychiatrist.  Thorough discussion with [name] regarding anticipated needs. She will require extensive care management, caregiver support, financial support and housing assistance. [name] indicated that Ms. Ofallon will not be able to return to her previous rental due to the unit currently being rented by another family. [name] is currently contacting multiple housing and apartment managers in the [place], however most units may not be affordable to Ms. Varas d/t limited income. Discussed challenge that age restricted/low income units have very lengthy wait list and will likely not be available during the time frame expected. Reports also speaking with other families within their religious community. Unfortunately the families will not be able to accommodate Ms. Bogacki in their homes. Accommodations will be  limited d/t her mobility limitations and difficulty ambulating up/down stairs and steps.  Reports Ms. Chittick's divorce is being finalized and she will not receive additional financial assistance from her spouse. Noted several community agencies that were previously providing assistance will not be available d/t exhausting funds.  Reports that Ms. Fanta is safe in her current host home. Reasonable plan would be to remain in [place] until housing, financial assistance and caregiver support can be confirmed. Reports last resort would be placement in a shelter if she opts to return prior to housing being arranged.  Agreed to touch base with updates  Update: 12/16/22: Collaborated with LCSW regarding plan for Ms. Godsil's return. Informed today that she will be admitted to an inpatient Behavioral Health facility when she returns to the states. Per message, patient is expected to return on December 17, 2022.  Update: 12/17/22: Received update from [name]. Reports being available in [place] as planned however Ms. Mcquate relayed a message indicating her flight would not be arriving until much later today. [name] will not be available to return to [place] today to assist with transportation back to [place].  Discussed Ms. Spalla's plan to go directly to the ED in preparation for admission to an inpatient [facility] unit. She will advise Ms. Chaffee to report to the nearest ED in [place] to avoid delays in evaluation/treatment. Update: 12/18/22: Outreach with [name]. Reports being unable to reach Ms. Floss last night or in the early morning. Was initially unclear if she actually arrived in [place]. Reports receiving a call from Ms. Mcquigg mid morning stating she is currently at [facility] in  [place]. Verified via [facility] report. [name] will call if she receives updates. Will update LCSW and PCP.             PLAN Pending patient's disposition plan from [facility]   Katina Degree [facility]   [facility], [facility] Health RN Care Manager Direct Dial: [phone] Website: [email or web address]

## 2022-12-21 ENCOUNTER — Other Ambulatory Visit: Payer: Self-pay

## 2022-12-21 NOTE — Patient Outreach (Signed)
  Care Management   Visit Note  12/21/2022 Name: Shannon [name] MRN: [id number] DOB: 02-Feb-1966  Subjective: Shannon [name] is a 57 y.o. year old female who is a primary care patient of Suezanne Jacquet, [name], NP-C. The Care Management team was consulted for assistance.      Engaged with [facility], [place] via telephone today.

## 2022-12-22 ENCOUNTER — Encounter: Payer: Self-pay | Admitting: Licensed Clinical Social Worker

## 2022-12-22 DIAGNOSIS — I10 Essential (primary) hypertension: Secondary | ICD-10-CM | POA: Diagnosis not present

## 2022-12-24 ENCOUNTER — Other Ambulatory Visit: Payer: Self-pay

## 2022-12-24 NOTE — Patient Outreach (Signed)
  Care Coordination   Collaboration  Visit Note   12/22/2022 Name: Shannon [name] MRN: [id number] DOB: [birth year]  Shannon [name] is a 57 y.o. year old female who sees [place], [place], NP-C for primary care. I  engaged with RNCM  What matters to the patients health and wellness today?  Patient was not engaged during this encounter   Care Coordination Interventions: Patient to be engaged ongoing by [facility] and LCSW Jenel Lucks to address disease management and social support needs   SDOH assessments and interventions completed:  No     Care Coordination Interventions:  Yes, provided  Interventions Today    Flowsheet Row Most Recent Value  Chronic Disease   Chronic disease during today's visit Other  [Mixed Bipolar Disorder and Housing Instability]  General Interventions   General Interventions Discussed/Reviewed Communication with  Communication with RN  [LCSW and RNCM collaborated regarding patient care needs and strategies to strengthen support system. Housing resources discussed. RNCM has been in contact with hospital staff and will continue to f/up prior to pt d/c]  Mental Health Interventions   Mental Health Discussed/Reviewed Mental Health Reviewed       Follow up plan:  RNCM and LCSW will f/up with one another within a week.    Encounter Outcome:  Patient Visit Completed   Jenel Lucks, MSW, LCSW [facility] Care Management [facility]  [facility] [email or web address] Phone [phone] 8:56 AM

## 2022-12-25 ENCOUNTER — Ambulatory Visit: Payer: Medicare Other | Admitting: Family Medicine

## 2022-12-25 NOTE — Patient Outreach (Addendum)
Care Management   Visit Note   Name: Shannon [name] MRN: [id number] DOB: October 14, 1965  Subjective: Shannon [name] is a 57 y.o. year old female who is a primary care patient of Suezanne Jacquet, [name], NP-C. The Care Management team was consulted for assistance.       Goals Addressed             This Visit's Progress    Care Management       Current Barriers:  Care Management support related to Mixed Bipolar Disorder, Hypertension and mobility limitations Lacks caregiver support.  Corporate treasurer.  Transportation barriers Housing needs  Planned Interventions: Outreach with [employer] regarding Ms. Olano's plan to return to the  [name] within the next few weeks if possible. Original plan was for Ms. Spitler to remain in [place] for six months. She was due to return to the [name] in March. Reports that Ms. Brancato currently has adequate housing in [place] however Ms. Wahab expressed concerns that she does not have adequate support from the host family. Also expressed concerns regarding unavailability of certain medications. Per chart review, she has been completing virtual visits with her Psychiatrist.  Thorough discussion with [name] regarding anticipated needs. She will require extensive care management, caregiver support, financial support and housing assistance. [name] indicated that Ms. Grigoryan will not be able to return to her previous rental due to the unit currently being rented by another family. [name] is currently contacting multiple housing and apartment managers in [place], however most units may not be affordable to Ms. Vallely d/t limited income. Discussed challenge that age restricted/low income units have very lengthy wait list and will likely not be available during the time frame expected. Reports also speaking with other families within their religious community. Unfortunately the families will not be able to accommodate Ms. Sloma in their homes. Accommodations will be limited d/t her  mobility limitations and difficulty ambulating up/down stairs and steps.  Reports Ms. Djordjevic's divorce is being finalized and she will not receive additional financial assistance from her spouse. Noted several community agencies that were previously providing assistance will not be available d/t exhausting funds.  Reports that Ms. Schwanke is safe in her current host home. Reasonable plan would be to remain in [place] until housing, financial assistance and caregiver support can be confirmed. Reports last resort would be placement in a shelter if she opts to return prior to housing being arranged.  Agreed to touch base with updates  Update: 12/16/22: Collaborated with LCSW regarding plan for Ms. Pursel's return. Informed today that she will be admitted to an inpatient Behavioral Health facility when she returns to the states. Per message, patient is expected to return on December 17, 2022.  Update: 12/17/22: Received update from [name]. Reports being available in [place] as planned however Ms. Whittinghill relayed a message indicating her flight would not be arriving until much later today. [name] will not be available to return to [place] today to assist with transportation back to [place].  Discussed Ms. Lonigro's plan to go directly to the ED in preparation for admission to an inpatient [facility] unit. She will advise Ms. Everetts to report to the nearest ED in [place] to avoid delays in evaluation/treatment. Update: 12/18/22: Outreach with [name]. Reports being unable to reach Ms. Mcwilliams last night or in the early morning. Was initially unclear if she actually arrived in [place]. Reports receiving a call from Ms. Penland mid morning stating she is currently at [facility] in [place]. Verified  via [facility] report. [name] will call if she receives updates. Will update LCSW and PCP. Update: 12/21/22: Collaborated with Tori[facility] psychotherapist. Reports Ms. Cade was transferred and currently admitted in the  Behavioral Health unit. Acknowledges multiple SDOH barriers in addition to concerns r/t depression. Current plan is to remain in the unit until mood is stable and temporary housing can be provided. There is an available women's shelter in the area, however members are expected to perform certain tasks and be out during the day. Ms. Trautner ability to ambulate has declined and she has been mostly wheelchair bound since her arrival. Posey Rea if she will eligible for placement in the shelter.  Delorise Jackson will discuss with the PT team regarding a physical therapy assessment and recommendations regarding possible SNF placement. Will continue to collaborate with the [facility] team regarding plan for discharge. Update 12/24/22: Collaborated with Tori[facility] SW. Reports options were discussed with Ms. Massett and she was offered placement at a local facility for substance abuse/mental health patients. Reports Ms. Morency declined and requested to return to [place]. Tori indicated she will be discharged as requested. The [facility] team has made arrangements for her to return to [place] via bus on 12/25/22. An appointment has been scheduled with her PCP on 12/25/22. RNCM attempted to reach Ms. Calix today without success. Able to complete successful outreach with [name]. [name] is aware of the plan for discharge on 12/25/22. Reports Ms. Luddy declined placement d/t preferring not to reside in a group setting. The anticipated length of stay would have been 60 days. Reports the current plan is to stay in a local extended stay hotel until she is able to receive temporary shelter at [facility].  Collaborated with LCSW regarding current plan for discharge.          PLAN Will follow up within the next week   Katina [facility]  [facility], [facility] RN Care Manager Direct Dial: [phone] Website: [email or web address]

## 2022-12-28 ENCOUNTER — Other Ambulatory Visit: Payer: Self-pay

## 2022-12-28 NOTE — Patient Outreach (Signed)
  Care Management   Visit Note  12/28/2022 Name: Shannon [name] MRN: [id number] DOB: August 07, 1965  Subjective: Shannon [name] is a 57 y.o. year old female who is a primary care patient of Suezanne Jacquet, [name], NP-C. The Care Management team was consulted for assistance.      Engaged with patient via telephone.  Assessment:  Outpatient Encounter Medications as of 12/28/2022  Medication Sig Note   amLODipine (NORVASC) 5 MG tablet Take 1 tablet (5 mg total) by mouth daily. (Patient not taking: Reported on 08/28/2022) 09/03/2022: 09/03/22- reports during TOC call not currently taking- states "I don't have this medication"   atorvastatin (LIPITOR) 20 MG tablet TAKE ONE TABLET BY MOUTH ONCE DAILY FOR CHOLESTEROL    clonazePAM (KLONOPIN) 0.5 MG tablet Take 1 tablet (0.5 mg total) by mouth 2 (two) times daily as needed for anxiety.    FLUoxetine (PROZAC) 20 MG capsule Take 1 capsule (20 mg total) by mouth daily.    gabapentin (NEURONTIN) 300 MG capsule TAKE 1 CAPSULE (300 MG TOTAL) BY MOUTH AT BEDTIME.    glycerin adult 2 g suppository Place 1 suppository rectally as needed for constipation. (Patient not taking: Reported on 09/03/2022) 09/03/2022: 09/03/22- reports during TOC call not currently taking- states "I don't have this medication"   IMODIUM A-D 2 MG tablet Take 2 mg by mouth 4 (four) times daily as needed for diarrhea or loose stools. (Patient not taking: Reported on 09/03/2022) 09/03/2022: 09/03/22- reports during TOC call not currently taking- states "I don't have this medication"   Multiple Vitamins-Minerals (BARIATRIC MULTIVITAMINS/IRON) CAPS Take 1 capsule by mouth daily. (Patient not taking: Reported on 09/03/2022) 09/03/2022: 09/03/22- reports during TOC call not currently taking- states "I don't have this medication"   pantoprazole (PROTONIX) 40 MG tablet Take 1 tablet (40 mg total) by mouth 2 (two) times daily before a meal.    QUEtiapine (SEROQUEL) 25 MG tablet Take 1 tablet (25 mg total) by mouth 2  (two) times daily.    QUEtiapine (SEROQUEL) 300 MG tablet Take 1 tablet (300 mg total) by mouth at bedtime.    No facility-administered encounter medications on file as of 12/28/2022.    Interventions:

## 2022-12-29 ENCOUNTER — Other Ambulatory Visit: Payer: Self-pay

## 2022-12-29 NOTE — Telephone Encounter (Signed)
Per Larene Beach, pt has missed appointment and has not been seen since 2023. Will need to be seen in office for any further information.

## 2022-12-29 NOTE — Patient Outreach (Signed)
  Care Management   Visit Note  12/29/2022 Name: Shannon [name] MRN: [id number] DOB: 23-Mar-1965  Subjective: Shannon [name] is a 57 y.o. year old female who is a primary care patient of Suezanne Jacquet, [name], NP-C. The Care Management team was consulted for assistance.      Engaged with patient via telephone.   Assessment:  Current Outpatient Medications on File Prior to Visit  Medication Sig Dispense Refill   amLODipine (NORVASC) 5 MG tablet Take 1 tablet (5 mg total) by mouth daily. (Patient not taking: Reported on 08/28/2022) 30 tablet 2   atorvastatin (LIPITOR) 20 MG tablet TAKE ONE TABLET BY MOUTH ONCE DAILY FOR CHOLESTEROL 90 tablet 1   clonazePAM (KLONOPIN) 0.5 MG tablet Take 1 tablet (0.5 mg total) by mouth 2 (two) times daily as needed for anxiety. 180 tablet 0   FLUoxetine (PROZAC) 20 MG capsule Take 1 capsule (20 mg total) by mouth daily. 90 capsule 0   gabapentin (NEURONTIN) 300 MG capsule TAKE 1 CAPSULE (300 MG TOTAL) BY MOUTH AT BEDTIME. 90 capsule 0   glycerin adult 2 g suppository Place 1 suppository rectally as needed for constipation. (Patient not taking: Reported on 09/03/2022)     IMODIUM A-D 2 MG tablet Take 2 mg by mouth 4 (four) times daily as needed for diarrhea or loose stools. (Patient not taking: Reported on 09/03/2022)     Multiple Vitamins-Minerals (BARIATRIC MULTIVITAMINS/IRON) CAPS Take 1 capsule by mouth daily. (Patient not taking: Reported on 09/03/2022) 30 capsule 2   pantoprazole (PROTONIX) 40 MG tablet Take 1 tablet (40 mg total) by mouth 2 (two) times daily before a meal. 180 tablet 0   QUEtiapine (SEROQUEL) 25 MG tablet Take 1 tablet (25 mg total) by mouth 2 (two) times daily. 180 tablet 0   QUEtiapine (SEROQUEL) 300 MG tablet Take 1 tablet (300 mg total) by mouth at bedtime. 90 tablet 0   No current facility-administered medications on file prior to visit.    Interventions: {CCM RNCM INTERVENTIONS:22296}  Recommendations:     Consent to Services:   {CAREMANAGEMENTCONSENTOPTIONS:24923}  Plan: {CM FOLLOW UP PLAN:22241}  SIG

## 2022-12-29 NOTE — Telephone Encounter (Signed)
Patient called to provide a contact number at her hotel. It is [phone] and she is in room 402. Patient would like a call back when possible.

## 2022-12-30 ENCOUNTER — Encounter: Payer: Self-pay | Admitting: Licensed Clinical Social Worker

## 2022-12-30 ENCOUNTER — Other Ambulatory Visit: Payer: Self-pay

## 2022-12-31 ENCOUNTER — Other Ambulatory Visit: Payer: Self-pay

## 2022-12-31 NOTE — Patient Outreach (Addendum)
Care Management   Visit Note   Name: Shannon [name] MRN: [id number] DOB: March 11, 1965  Subjective: Shannon [name] is a 57 y.o. year old female who is a primary care patient of Shannon [name], Shannon L, NP-C. The Care Management team was consulted for assistance.      Engaged with patient via telephone   Interventions:   Goals Addressed             This Visit's Progress    Care Management       Current Barriers:  Care Management support related to Mixed Bipolar Disorder, Hypertension and mobility limitations Lacks caregiver support.  Corporate treasurer.  Transportation barriers Housing needs  Planned Interventions: Outreach with Shannon [name] regarding plan since discharge from [place] on 12/25/22. Reports feeling well today. Reports having all required medications.  She is currently in [place]. Reports she will be in an extended stay hotel until 01/01/23.  The [facility] hospital team made arrangements for her to return to [place] via bus however she forgot to obtain the ticket at discharge. She is aware that she will need to be evaluated by a provider prior to receiving additional medications. She was scheduled for an appointment with her PCP on 12/25/22, however opted to stay in [place] until 01/01/23. Thorough discussion regarding long term needs. Discussed plan for treatment at [facility] in [place]. She prefers not to stay in a group setting. Reports other facilities were offered. Reports their primary focus was drug rehab/detox making her ineligible.  She is aware that housing is very limited d/t her limited income. Returning to her previous rental unit is not an option due to the unit being rented out. Her current options include temporary placement at a shelter until housing is available. She prefers to return to [place] for temporary shelter at [facility] instead of seeking shelter in the [place] area. Discussed possible options and support from family in other states. Reports  speaking with a brother in [place]. He is willing to assist her with phone services however she was not offered housing or additional financial assistance. She has not received updates regarding housing or additional support from her brother in [place]. Reviewed safety and fall prevention measures. Reports her neuropathy has worsened. She is able to ambulate with a walker. Reports going to a local [employer] earlier today without complications. Thorough discussion regarding worsening s/sx that require immediate medical attention.  Update 12/29/22: Call received from Shannon [name]. Reports experiencing severe pain in her upper and lower extremities. Reports the pain worsened last night around 7 pm. Reports taking medication for neuropathy as prescribed however pain has not subsided. Reports her mobility has also worsened d/t the pain. She is requesting an order for additional pain medication today. Advised that she needs to be evaluated. Advised that emergency services be notified for pick up from the extended stay hotel. She declined and prefers not to return to the ER or local hospital. Requested assistance with arranging transportation back to [place]. An appointment with her PCP was booked for 01/01/23 at 3:40. A conference call was made today with Shannon [name] regarding transportation benefit. Representative advised Shannon [name] that her current plan only covers ambulance/Emergent transport. The representative agreed to submit a request for a waiver today, however notes it will likely be denied. She was again advised to seek evaluation in the nearest Urgent Care or Emergency Room d/t her severe pain and decreased ambulation. Shannon [name] declined. She does not feel that emergent evaluation is currently needed. Reports that  she will rest today until pain decreases. Reviewed worsening symptoms and indications for calling 911.  Update 12/30/22: Shannon [name] reports feeling better today. Reports episodes of 6/10 or 7/10 pain  but reports it has been tolerable today. Denies falls.  Reports she plans to return to [place] via [place]. She will need assistance with purchasing the ticket. Reports speaking with friends in [place] that may be able to provide minimal financial support including assistance with hotel charges. Reports her current plan is to return to [place] and seek temporary housing at a local shelter. Reports a friend is keeping her vehicle in [place]. Reports she anticipates being able to drive when she arrives.  Reports speaking with her Medicaid point of contact today. Her current coverage only provides assistance with insurance premiums. Reports the point of contact will assist her with reapplying for medicaid benefits.  Collaborated with LCSW regarding options for assistance and housing. Received resources regarding available facilities and group homes in the [place] area. Reports resources may also be available via [facility] however the team will need to actually speak with her before arranging shelter. Will discuss options with Shannon [name] during our outreach on 12/31/22.    Patient Goals/Self-Care Take medications as prescribed Follow recommended safety and fall prevention measures Seek immediate medical attention if your condition worsens.          PLAN  Will follow up on 12/31/22   Shannon [name] [name] [name]  [facility], [facility] RN Care Manager Direct Dial: [phone] Website: [email or web address]

## 2022-12-31 NOTE — Patient Outreach (Signed)
  Care Coordination   Collaboration  Visit Note   12/30/2022 Name: Shannon [name] MRN: [id number] DOB: [birth year]  Shannon [name] is a 57 y.o. year old female who sees Bagnell, [place], NP-C for primary care. I  engaged with RNCM  What matters to the patients health and wellness today?  Patient was not engaged during this encounter   SDOH assessments and interventions completed:  No     Care Coordination Interventions:  Yes, provided  Interventions Today    Flowsheet Row Most Recent Value  Chronic Disease   Chronic disease during today's visit Other  [Mixed Bipolar Disorder and Housing Instability]  General Interventions   General Interventions Discussed/Reviewed Communication with, General Interventions Reviewed, Risk analyst with RN  [employer] collaborated with [facility] about patient care updates. LCSW provided RNCM with supportive resources, including listing of [facility] and contact info for [facility]]       Follow up plan:  LCSW will continue to provide supportive resources to Pt and [facility] team    Encounter Outcome:  Patient Visit Completed   Jenel Lucks, MSW, LCSW [facility] Care Management [facility]  [facility] [email or web address] Phone [phone] 5:58 AM

## 2023-01-01 ENCOUNTER — Emergency Department (HOSPITAL_COMMUNITY)
Admission: EM | Admit: 2023-01-01 | Discharge: 2023-01-03 | Disposition: A | Discharge status: Other Acute Inpatient Hospital | Payer: Medicare Other | Attending: Emergency Medicine | Admitting: Emergency Medicine

## 2023-01-01 ENCOUNTER — Other Ambulatory Visit: Payer: Self-pay

## 2023-01-01 ENCOUNTER — Encounter (HOSPITAL_COMMUNITY): Payer: Self-pay | Admitting: Emergency Medicine

## 2023-01-01 ENCOUNTER — Ambulatory Visit (INDEPENDENT_AMBULATORY_CARE_PROVIDER_SITE_OTHER): Payer: Medicare Other | Admitting: Family Medicine

## 2023-01-01 VITALS — BP 118/70 | HR 116 | Temp 98.9°F | Ht 64.0 in

## 2023-01-01 DIAGNOSIS — Z9181 History of falling: Secondary | ICD-10-CM | POA: Insufficient documentation

## 2023-01-01 DIAGNOSIS — G8929 Other chronic pain: Secondary | ICD-10-CM | POA: Insufficient documentation

## 2023-01-01 DIAGNOSIS — R4589 Other symptoms and signs involving emotional state: Secondary | ICD-10-CM | POA: Diagnosis not present

## 2023-01-01 DIAGNOSIS — M1612 Unilateral primary osteoarthritis, left hip: Secondary | ICD-10-CM | POA: Diagnosis not present

## 2023-01-01 DIAGNOSIS — F315 Bipolar disorder, current episode depressed, severe, with psychotic features: Secondary | ICD-10-CM | POA: Diagnosis not present

## 2023-01-01 DIAGNOSIS — R45851 Suicidal ideations: Secondary | ICD-10-CM | POA: Diagnosis not present

## 2023-01-01 DIAGNOSIS — I10 Essential (primary) hypertension: Secondary | ICD-10-CM | POA: Diagnosis not present

## 2023-01-01 DIAGNOSIS — Z79899 Other long term (current) drug therapy: Secondary | ICD-10-CM | POA: Diagnosis not present

## 2023-01-01 DIAGNOSIS — F3160 Bipolar disorder, current episode mixed, unspecified: Secondary | ICD-10-CM

## 2023-01-01 DIAGNOSIS — Z5900 Homelessness unspecified: Secondary | ICD-10-CM | POA: Insufficient documentation

## 2023-01-01 DIAGNOSIS — K219 Gastro-esophageal reflux disease without esophagitis: Secondary | ICD-10-CM | POA: Diagnosis not present

## 2023-01-01 DIAGNOSIS — E876 Hypokalemia: Secondary | ICD-10-CM | POA: Diagnosis not present

## 2023-01-01 DIAGNOSIS — Z20822 Contact with and (suspected) exposure to covid-19: Secondary | ICD-10-CM | POA: Insufficient documentation

## 2023-01-01 DIAGNOSIS — Z609 Problem related to social environment, unspecified: Secondary | ICD-10-CM | POA: Insufficient documentation

## 2023-01-01 LAB — CBC WITH DIFFERENTIAL/PLATELET
Abs Immature Granulocytes: 0.07 10*3/uL (ref 0.00–0.07)
Basophils Absolute: 0.1 10*3/uL (ref 0.0–0.1)
Basophils Relative: 0 %
Eosinophils Absolute: 0.2 10*3/uL (ref 0.0–0.5)
Eosinophils Relative: 1 %
HCT: 38.8 % (ref 36.0–46.0)
Hemoglobin: 12.4 g/dL (ref 12.0–15.0)
Immature Granulocytes: 1 %
Lymphocytes Relative: 21 %
Lymphs Abs: 2.7 10*3/uL (ref 0.7–4.0)
MCH: 25.7 pg — ABNORMAL LOW (ref 26.0–34.0)
MCHC: 32 g/dL (ref 30.0–36.0)
MCV: 80.5 fL (ref 80.0–100.0)
Monocytes Absolute: 0.8 10*3/uL (ref 0.1–1.0)
Monocytes Relative: 6 %
Neutro Abs: 9.1 10*3/uL — ABNORMAL HIGH (ref 1.7–7.7)
Neutrophils Relative %: 71 %
Platelets: 417 10*3/uL — ABNORMAL HIGH (ref 150–400)
RBC: 4.82 MIL/uL (ref 3.87–5.11)
RDW: 16.7 % — ABNORMAL HIGH (ref 11.5–15.5)
WBC: 13 10*3/uL — ABNORMAL HIGH (ref 4.0–10.5)
nRBC: 0 % (ref 0.0–0.2)

## 2023-01-01 LAB — COMPREHENSIVE METABOLIC PANEL
ALT: 12 U/L (ref 0–44)
AST: 13 U/L — ABNORMAL LOW (ref 15–41)
Albumin: 3.3 g/dL — ABNORMAL LOW (ref 3.5–5.0)
Alkaline Phosphatase: 89 U/L (ref 38–126)
Anion gap: 11 (ref 5–15)
BUN: 11 mg/dL (ref 6–20)
CO2: 31 mmol/L (ref 22–32)
Calcium: 8.8 mg/dL — ABNORMAL LOW (ref 8.9–10.3)
Chloride: 91 mmol/L — ABNORMAL LOW (ref 98–111)
Creatinine, Ser: 1.18 mg/dL — ABNORMAL HIGH (ref 0.44–1.00)
GFR, Estimated: 54 mL/min — ABNORMAL LOW (ref >60)
Glucose, Bld: 102 mg/dL — ABNORMAL HIGH (ref 70–99)
Potassium: 2.8 mmol/L — ABNORMAL LOW (ref 3.5–5.1)
Sodium: 133 mmol/L — ABNORMAL LOW (ref 135–145)
Total Bilirubin: 0.5 mg/dL (ref <1.2)
Total Protein: 8.7 g/dL — ABNORMAL HIGH (ref 6.5–8.1)

## 2023-01-01 LAB — ETHANOL: Alcohol, Ethyl (B): <10 mg/dL (ref <10)

## 2023-01-01 MED ORDER — POTASSIUM CHLORIDE CRYS ER 20 MEQ PO TBCR
40.0000 meq | EXTENDED_RELEASE_TABLET | Freq: Two times a day (BID) | ORAL | Status: DC
  Administered 2023-01-02 – 2023-01-03 (×3): 40 meq via ORAL
  Filled 2023-01-01 (×4): qty 2

## 2023-01-01 MED ORDER — CLONAZEPAM 0.5 MG PO TABS
0.5000 mg | ORAL_TABLET | Freq: Two times a day (BID) | ORAL | Status: DC | PRN
  Administered 2023-01-01 – 2023-01-02 (×2): 0.5 mg via ORAL
  Filled 2023-01-01 (×2): qty 1

## 2023-01-01 MED ORDER — AMLODIPINE BESYLATE 5 MG PO TABS
5.0000 mg | ORAL_TABLET | Freq: Every day | ORAL | Status: DC
  Administered 2023-01-01 – 2023-01-03 (×3): 5 mg via ORAL
  Filled 2023-01-01 (×3): qty 1

## 2023-01-01 MED ORDER — ATORVASTATIN CALCIUM 10 MG PO TABS
10.0000 mg | ORAL_TABLET | Freq: Every day | ORAL | Status: DC
  Administered 2023-01-01 – 2023-01-03 (×3): 10 mg via ORAL
  Filled 2023-01-01 (×3): qty 1

## 2023-01-01 MED ORDER — ACETAMINOPHEN 325 MG PO TABS
650.0000 mg | ORAL_TABLET | Freq: Four times a day (QID) | ORAL | Status: DC | PRN
  Administered 2023-01-01 – 2023-01-02 (×2): 650 mg via ORAL
  Filled 2023-01-01 (×3): qty 2

## 2023-01-01 MED ORDER — ZIPRASIDONE MESYLATE 20 MG IM SOLR
20.0000 mg | Freq: Once | INTRAMUSCULAR | Status: AC
Start: 2023-01-01 — End: 2023-01-01
  Administered 2023-01-01: 20 mg via INTRAMUSCULAR
  Filled 2023-01-01: qty 20

## 2023-01-01 MED ORDER — STERILE WATER FOR INJECTION IJ SOLN
INTRAMUSCULAR | Status: AC
  Administered 2023-01-01: 1.2 mL
  Filled 2023-01-01: qty 10

## 2023-01-01 MED ORDER — FLUOXETINE HCL 20 MG PO CAPS
20.0000 mg | ORAL_CAPSULE | Freq: Every day | ORAL | Status: DC
  Administered 2023-01-01 – 2023-01-03 (×3): 20 mg via ORAL
  Filled 2023-01-01 (×3): qty 1

## 2023-01-01 MED ORDER — GABAPENTIN 300 MG PO CAPS
300.0000 mg | ORAL_CAPSULE | Freq: Every day | ORAL | Status: DC
  Administered 2023-01-02 (×2): 300 mg via ORAL
  Filled 2023-01-01 (×2): qty 1

## 2023-01-01 NOTE — Progress Notes (Signed)
Subjective:     Patient ID: Shannon [name], female    DOB: 1966-01-15, 57 y.o.   MRN: [id number]  Chief Complaint  Patient presents with   Peripheral Neuropathy    Patient states she's in constant pain on the bottom/top feet, hands, and bottom lip. Patient states when she went to [facility] none of her issues were resolved nor fixed. Patient c/o of being depressed, hurting, no family.     HPI  History of Present Illness         She is here for a follow up. I have not seen her in the office in over a year.   States she just moved back here from [place]. States she was hospitalized in [place] after returning from [place] and after discharge, has been living in a hotel. States she had to leave the hotel today.  States she called an Benedetto Goad to bring her here from [place].   States she has been on the street in the past and will kill herself before she has to go back on the street.    Dr. Yehuda Budd is her psychiatrist.    C/o chronic pain. pain in lips, hands, knees, feet.   She is suicidal.  Does not want to go on living. States she does not have any family, friends or anyone to help her. 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   She is divorced.  Has a daughter but is estranged.      01/01/2023    3:21 PM 04/07/2022    9:58 AM 02/02/2022    1:08 PM 01/26/2022    1:58 PM 01/23/2022    4:04 PM  Depression screen PHQ 2/9  Decreased Interest 3 1 2 2 3   Down, Depressed, Hopeless 3 1 2 2 3   PHQ - 2 Score 6 2 4 4 6   Altered sleeping 3 1 1 1 1   Tired, decreased energy 3 1 1 2 3   Change in appetite 3 1 1 1 1   Feeling bad or failure about yourself  3 1 1 1 1   Trouble concentrating 3 1 1 1 3   Moving slowly or fidgety/restless 3 1 1 1  0  Suicidal thoughts 3 0 0 0 1  PHQ-9 Score 27 8 10 11 16   Difficult doing work/chores Extremely dIfficult Somewhat difficult Very difficult Very difficult Very difficult        Health Maintenance Due  Topic Date Due    MAMMOGRAM  06/23/2014   DTaP/Tdap/Td (2 - Tdap) 02/28/2019   COVID-19 Vaccine (3 - Pfizer risk series) 01/08/2020   Fecal DNA (Cologuard)  01/10/2020   INFLUENZA VACCINE  09/24/2022   Medicare Annual Wellness (AWV)  10/08/2022    Past Medical History:  Diagnosis Date   Allergic rhinitis    Allergic rhinitis 01/25/2009        ANEMIA-IRON DEFICIENCY 01/25/2009   Anxiety    Backache 01/25/2009   Qualifier: Diagnosis of  By: Jonny Ruiz MD, Len Blalock    Bipolar 1 disorder [facility])    Chronic pain syndrome    Complete rotator cuff tear 07/21/2013   Complete tear of right rotator cuff 07/21/2013   Contact lens/glasses fitting    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   DISC DISEASE, LUMBAR 01/25/2009   Essential hypertension 01/25/2009   On Bystolic    GAD (generalized anxiety disorder) 12/28/2013   GERD (gastroesophageal reflux disease)    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  Headache, acute 12/20/2013   Hx of laparoscopic gastric banding 09/03/2010   Surgery date: 07/17/10    HYPERLIPIDEMIA 01/25/2009   Hyperlipidemia with target LDL less than 130 01/25/2009   HYPERTENSION 01/25/2009   Hypokalemia, inadequate intake 09/01/2011   Insomnia 04/04/2011   INSOMNIA-SLEEP DISORDER-UNSPEC 04/30/2009   Qualifier: Diagnosis of  By: Jonny Ruiz MD, Len Blalock    Iron deficiency anemia 01/25/2009        Labyrinthitis 02/19/2014   Leiomyoma of uterus, unspecified 08/13/2008   Overview:  Leiomyoma Of The Uterus  10/1 IMO update   Localized edema 06/12/2015   MANIC DEPRESSIVE ILLNESS 01/25/2009   pt is unsue if this is her specifc dx   Mixed bipolar I disorder (HCC) 07/12/2012   Morbid obesity (HCC) 01/25/2009   Night sweats    Osteopenia determined by x-ray 06/13/2021   Other abnormal glucose 10/26/2012   Palpitations 10/25/2013   PEPTIC ULCER DISEASE, HELICOBACTER PYLORI POSITIVE 10/03/2009   Peripheral edema 06/25/2015   Pernicious anemia 03/28/2012   Piriformis syndrome of both sides 09/02/2018   PUD (peptic ulcer disease)  10/03/2009        Right shoulder pain 05/03/2013   Dg Shoulder Right  05/03/2013   CLINICAL DATA Pain.  EXAM RIGHT SHOULDER - 2+ VIEW  COMPARISON None.  FINDINGS Acromioclavicular and glenohumeral degenerative change present. Questionable calcific density noted in the region of the supraspinatus space, possibly representing calcific supraspinatus tendinitis. This could represent a sclerotic density in the acromion. MRI of the right shoulder suggest   Sleep apnea 08/12/2016   SMOKER 01/25/2009   Qualifier: Diagnosis of  By: Jonny Ruiz MD, Len Blalock    Subacromial bursitis 05/17/2013   SUBSTANCE ABUSE 11/06/2009        Thiamin deficiency 10/30/2012   Tobacco abuse 06/09/2010   Visual disturbance 05/03/2013   Vitamin D deficiency 10/27/2012    Past Surgical History:  Procedure Laterality Date   ABDOMINAL HYSTERECTOMY     BLADDER SURGERY     s/p with ?diverticulitis   HAND TENDON SURGERY  1991   s/p-Right-index and middle   LAPAROSCOPIC GASTRIC BAND REMOVAL WITH LAPAROSCOPIC GASTRIC SLEEVE RESECTION N/A 08/17/2016   Procedure: LAPAROSCOPIC GASTRIC BAND REMOVAL WITH LAPAROSCOPIC GASTRIC SLEEVE RESECTION WITH UPPER ENDO;  Surgeon: Ovidio Kin, MD;  Location: [facility];  Service: General;  Laterality: N/A;   LAPAROSCOPIC GASTRIC BANDING  07/14/10   SHOULDER ARTHROSCOPY Right 07/21/2013   Procedure: RIGHT ARTHROSCOPY SHOULDER DEBRIDMENT EXTENTSIVE,ARTHROSCOPIC REMOVE LOOSE FOREIGN BODY, BICEPS TENOLYSIS ;  Surgeon: Eulas Post, MD;  Location: [facility];  Service: Orthopedics;  Laterality: Right;   TUBAL LIGATION      Family History  Problem Relation Age of Onset   Hypertension Mother    Stroke Father    Cirrhosis Father        ETOH   Hypertension Father    Diabetes Father    Alcohol abuse Father    Hypertension Sister    Asthma Sister    Hypertension Brother    Hypertension Paternal Aunt    Diabetes Maternal Grandmother    ADD / ADHD Son    ADD / ADHD Other        3 nephews, also  emotional issues   Diabetes Other        Uncle   Diabetes Other        Aunt   Suicidality Neg Hx     Social History   Socioeconomic History   Marital status: Divorced  Spouse name: Not on file   Number of children: 2   Years of education: Not on file   Highest education level: Professional school degree (e.g., MD, DDS, DVM, JD)  Occupational History   Occupation: STUDENT    Employer: UNEMPLOYED  Tobacco Use   Smoking status: Every Day    Current packs/day: 0.25    Average packs/day: 0.3 packs/day for 20.0 years (5.0 ttl pk-yrs)    Types: Cigarettes   Smokeless tobacco: Never   Tobacco comments:    States has cut back to 5-7 a day and working on this.   Vaping Use   Vaping status: Never Used  Substance and Sexual Activity   Alcohol use: No    Alcohol/week: 0.0 standard drinks of alcohol   Drug use: No   Sexual activity: Not Currently  Other Topics Concern   Not on file  Social History Narrative   Moved from [place], then [place] to [place] 2009   2 children-1 boy, 1 girl   Disabled-bipolar   Daily Caffeine Use-1 cup/day      Right Handed   Lives in 2nd floor condo   Social Determinants of Health   Financial Resource Strain: High Risk (01/01/2023)   Overall Financial Resource Strain (CARDIA)    Difficulty of Paying Living Expenses: Very hard  Food Insecurity: Food Insecurity Present (01/01/2023)   Hunger Vital Sign    Worried About Running Out of Food in the Last Year: Often true    Ran Out of Food in the Last Year: Often true  Transportation Needs: Patient Declined (01/01/2023)   PRAPARE - Administrator, Civil Service (Medical): Patient declined    Lack of Transportation (Non-Medical): Patient declined  Physical Activity: Inactive (01/01/2023)   Exercise Vital Sign    Days of Exercise per Week: 0 days    Minutes of Exercise per Session: 0 min  Stress: Stress Concern Present (01/01/2023)   [employer] of Occupational Health - Occupational  Stress Questionnaire    Feeling of Stress : Very much  Social Connections: Socially Isolated (01/01/2023)   Social Connection and Isolation Panel [NHANES]    Frequency of Communication with Friends and Family: Never    Frequency of Social Gatherings with Friends and Family: Never    Attends Religious Services: 1 to 4 times per year    Active Member of [employer] or Organizations: No    Attends Banker Meetings: Not on file    Marital Status: Separated  Intimate Partner Violence: Not At Risk (08/29/2022)   Humiliation, Afraid, Rape, and Kick questionnaire    Fear of Current or Ex-Partner: No    Emotionally Abused: No    Physically Abused: No    Sexually Abused: No    Outpatient Medications Prior to Visit  Medication Sig Dispense Refill   atorvastatin (LIPITOR) 20 MG tablet TAKE ONE TABLET BY MOUTH ONCE DAILY FOR CHOLESTEROL 90 tablet 1   FLUoxetine (PROZAC) 20 MG capsule Take 1 capsule (20 mg total) by mouth daily. 90 capsule 0   gabapentin (NEURONTIN) 300 MG capsule TAKE 1 CAPSULE (300 MG TOTAL) BY MOUTH AT BEDTIME. 90 capsule 0   pantoprazole (PROTONIX) 40 MG tablet Take 1 tablet (40 mg total) by mouth 2 (two) times daily before a meal. 180 tablet 0   QUEtiapine (SEROQUEL) 25 MG tablet Take 1 tablet (25 mg total) by mouth 2 (two) times daily. 180 tablet 0   QUEtiapine (SEROQUEL) 300 MG tablet Take 1 tablet (300  mg total) by mouth at bedtime. 90 tablet 0   amLODipine (NORVASC) 5 MG tablet Take 1 tablet (5 mg total) by mouth daily. (Patient not taking: Reported on 08/28/2022) 30 tablet 2   clonazePAM (KLONOPIN) 0.5 MG tablet Take 1 tablet (0.5 mg total) by mouth 2 (two) times daily as needed for anxiety. 180 tablet 0   glycerin adult 2 g suppository Place 1 suppository rectally as needed for constipation. (Patient not taking: Reported on 09/03/2022)     IMODIUM A-D 2 MG tablet Take 2 mg by mouth 4 (four) times daily as needed for diarrhea or loose stools. (Patient not taking: Reported  on 09/03/2022)     Multiple Vitamins-Minerals (BARIATRIC MULTIVITAMINS/IRON) CAPS Take 1 capsule by mouth daily. (Patient not taking: Reported on 09/03/2022) 30 capsule 2   No facility-administered medications prior to visit.    Allergies  Allergen Reactions   Lamictal [Lamotrigine] Rash    Review of Systems  Constitutional:  Negative for chills and fever.  Respiratory:  Negative for shortness of breath.   Cardiovascular:  Negative for chest pain, palpitations and leg swelling.  Gastrointestinal:  Negative for abdominal pain, constipation, diarrhea, nausea and vomiting.  Genitourinary:  Negative for dysuria, frequency and urgency.  Neurological:  Negative for dizziness.  Psychiatric/Behavioral:  Positive for depression and suicidal ideas. The patient is nervous/anxious.        Objective:    Physical Exam Constitutional:      General: She is in acute distress.     Appearance: She is obese. She is not ill-appearing.  Eyes:     Extraocular Movements: Extraocular movements intact.     Conjunctiva/sclera: Conjunctivae normal.  Cardiovascular:     Rate and Rhythm: Tachycardia present.  Pulmonary:     Effort: Pulmonary effort is normal.  Musculoskeletal:     Cervical back: Normal range of motion and neck supple.  Skin:    General: Skin is warm and dry.  Neurological:     General: No focal deficit present.     Mental Status: She is alert and oriented to person, place, and time.  Psychiatric:        Mood and Affect: Mood is depressed. Affect is tearful.        Behavior: Behavior is cooperative.        Thought Content: Thought content includes suicidal ideation.      BP 118/70 (BP Location: Left Arm, Patient Position: Sitting, Cuff Size: Normal)   Pulse (!) 116   Temp 98.9 F (37.2 C) (Oral)   Ht 5\' 4"  (1.626 m)   SpO2 93%   BMI 37.76 kg/m  Wt Readings from Last 3 Encounters:  09/09/22 220 lb (99.8 kg)  08/28/22 240 lb 12.8 oz (109.2 kg)  04/07/22 223 lb (101.2 kg)        Assessment & Plan:   Problem List Items Addressed This Visit       Cardiovascular and Mediastinum   Essential hypertension     Respiratory   Laryngopharyngeal reflux     Musculoskeletal and Integument   Degenerative joint disease (DJD) of hip     Other   At high risk for falls   Lack of housing   Mixed bipolar I disorder (HCC)   Other chronic pain   Other Visit Diagnoses     Suicidal risk    -  Primary      911 called due to suicidal risk. Her belongings were secured and she had a sitter with her  until police office and [facility] specialist arrived to take her to the ED.  I contacted [name], RN with [name] who has been managing her case to make her aware of the situation.   I am having Shannon [name] maintain her amLODipine, Bariatric Multivitamins/Iron, pantoprazole, glycerin adult, Imodium A-D, gabapentin, atorvastatin, clonazePAM, FLUoxetine, QUEtiapine, and QUEtiapine.  No orders of the defined types were placed in this encounter.

## 2023-01-01 NOTE — ED Provider Notes (Signed)
[place] EMERGENCY DEPARTMENT AT [facility] Provider Note   CSN: [id number] Arrival date & time: 01/01/23  1607     History  Chief complaint: Suicidal ideation  Shannon [name] is a 57 y.o. female.  HPI Patient has a history of bipolar disorder chronic pain syndrome acid reflux  Patient presents to the ED for evaluation of suicidal ideation.  Patient has a poor social situation.  She had been living in a hotel and had to leave today.  Patient took an [place] from [place] where she was at to see her doctor in the office today.  At the doctor's office patient indicated she had been on the streets in the past and was going to kill herself she had to go back on the streets.  Patient does not want to go on living.  Home Medications Prior to Admission medications   Medication Sig Start Date End Date Taking? Authorizing Provider  amLODipine (NORVASC) 5 MG tablet Take 1 tablet (5 mg total) by mouth daily. Patient not taking: Reported on 08/28/2022 05/14/21   Hetty Blend L, NP-C  atorvastatin (LIPITOR) 20 MG tablet TAKE ONE TABLET BY MOUTH ONCE DAILY FOR CHOLESTEROL 09/10/22   Henson, [name], NP-C  clonazePAM (KLONOPIN) 0.5 MG tablet Take 1 tablet (0.5 mg total) by mouth 2 (two) times daily as needed for anxiety. 11/12/22   Arfeen, Phillips Grout, MD  FLUoxetine (PROZAC) 20 MG capsule Take 1 capsule (20 mg total) by mouth daily. 11/12/22   Arfeen, Phillips Grout, MD  gabapentin (NEURONTIN) 300 MG capsule TAKE 1 CAPSULE (300 MG TOTAL) BY MOUTH AT BEDTIME. 09/10/22   Patel, Roxana Hires K, DO  glycerin adult 2 g suppository Place 1 suppository rectally as needed for constipation. Patient not taking: Reported on 09/03/2022    [provider]  IMODIUM A-D 2 MG tablet Take 2 mg by mouth 4 (four) times daily as needed for diarrhea or loose stools. Patient not taking: Reported on 09/03/2022    [provider]  Multiple Vitamins-Minerals (BARIATRIC MULTIVITAMINS/IRON) CAPS Take 1 capsule by mouth  daily. Patient not taking: Reported on 09/03/2022 01/14/22   Hetty Blend L, NP-C  pantoprazole (PROTONIX) 40 MG tablet Take 1 tablet (40 mg total) by mouth 2 (two) times daily before a meal. 01/27/22   Henson, [name], NP-C  QUEtiapine (SEROQUEL) 25 MG tablet Take 1 tablet (25 mg total) by mouth 2 (two) times daily. 11/12/22 02/10/23  Arfeen, Phillips Grout, MD  QUEtiapine (SEROQUEL) 300 MG tablet Take 1 tablet (300 mg total) by mouth at bedtime. 11/12/22 02/10/23  Cleotis Nipper, MD      Allergies    Lamictal [lamotrigine]    Review of Systems   Review of Systems  Physical Exam Updated Vital Signs BP (!) 156/87 (BP Location: Right Arm)   Pulse 89   Temp 98.2 F (36.8 C) (Oral)   Resp 18   Ht 1.626 m (5\' 4" )   Wt 81.6 kg   SpO2 100%   BMI 30.90 kg/m  Physical Exam Vitals and nursing note reviewed.  Constitutional:      Appearance: She is well-developed. She is not diaphoretic.  HENT:     Head: Normocephalic and atraumatic.     Right Ear: External ear normal.     Left Ear: External ear normal.  Eyes:     General: No scleral icterus.       Right eye: No discharge.        Left eye: No  discharge.     Conjunctiva/sclera: Conjunctivae normal.  Neck:     Trachea: No tracheal deviation.  Cardiovascular:     Rate and Rhythm: Normal rate and regular rhythm.  Pulmonary:     Effort: Pulmonary effort is normal. No respiratory distress.     Breath sounds: Normal breath sounds. No stridor. No wheezing or rales.  Abdominal:     General: Bowel sounds are normal. There is no distension.     Palpations: Abdomen is soft.     Tenderness: There is no abdominal tenderness. There is no guarding or rebound.  Musculoskeletal:        General: No tenderness or deformity.     Cervical back: Neck supple.  Skin:    General: Skin is warm and dry.     Findings: No rash.  Neurological:     General: No focal deficit present.     Mental Status: She is alert.     Cranial Nerves: No cranial nerve  deficit, dysarthria or facial asymmetry.     Sensory: No sensory deficit.     Motor: No abnormal muscle tone or seizure activity.     Coordination: Coordination normal.  Psychiatric:        Mood and Affect: Mood normal. Affect is not angry or tearful.        Speech: Speech is not rapid and pressured.        Behavior: Behavior is not aggressive.        Thought Content: Thought content includes suicidal ideation.     ED Results / Procedures / Treatments   Labs (all labs ordered are listed, but only abnormal results are displayed) Labs Reviewed  COMPREHENSIVE METABOLIC PANEL - Abnormal; Notable for the following components:      Result Value   Sodium 133 (*)    Potassium 2.8 (*)    Chloride 91 (*)    Glucose, Bld 102 (*)    Creatinine, Ser 1.18 (*)    Calcium 8.8 (*)    Total Protein 8.7 (*)    Albumin 3.3 (*)    AST 13 (*)    GFR, Estimated 54 (*)    All other components within normal limits  CBC WITH DIFFERENTIAL/PLATELET - Abnormal; Notable for the following components:   WBC 13.0 (*)    MCH 25.7 (*)    RDW 16.7 (*)    Platelets 417 (*)    Neutro Abs 9.1 (*)    All other components within normal limits  ETHANOL  RAPID URINE DRUG SCREEN, HOSP PERFORMED    EKG None  Radiology No results found.  Procedures Procedures    Medications Ordered in ED Medications  potassium chloride SA (KLOR-CON M) CR tablet 40 mEq (has no administration in time range)    ED Course/ Medical Decision Making/ A&P Clinical Course as of 01/01/23 2051  [facility] Jan 01, 2023  1708 While here patient told staff she wanted to take all of her medications.  Medications taken away from her [JK]  1904 CBC shows leukocytosis.  No signs of infection doubt clinically significant. [JK]  1905 Comprehensive metabolic panel(!) Patient's potassium is decreased at 2.8.  Will order potassium replacement [JK]  2044 Patient threatening to leave.  States the staff here is racist against her.  Will place under  IVC as patient has threatened to overdose and states she has been suicidal [JK]    Clinical Course User Index [JK] Linwood Dibbles, MD  Medical Decision Making Problems Addressed: Hypokalemia: acute illness or injury that poses a threat to life or bodily functions Suicidal ideation: acute illness or injury that poses a threat to life or bodily functions  Amount and/or Complexity of Data Reviewed Labs: ordered. Decision-making details documented in ED Course.  Risk Prescription drug management.   Patient presented to the ED for evaluation of suicidal ideation.  Patient without any medical complaints.  Patient's labs noted to be hypokalemic.  Patient does have history of same.  She does not have any vomiting or diarrhea in the ED.  Will replace with oral potassium replacement.  Otherwise patient is medically clear for psychiatric evaluation  The patient has been placed in psychiatric observation due to the need to provide a safe environment for the patient while obtaining psychiatric consultation and evaluation, as well as ongoing medical and medication management to treat the patient's condition.  The patient has not been placed under full IVC at this time.         Final Clinical Impression(s) / ED Diagnoses Final diagnoses:  Suicidal ideation  Hypokalemia    Rx / DC Orders ED Discharge Orders     None         Linwood Dibbles, MD 01/01/23 [phone]

## 2023-01-01 NOTE — Patient Outreach (Signed)
Care Management   Visit Note   Name: Shannon [name] MRN: [id number] DOB: May 05, 1965  Subjective: Shannon [name] is a 57 y.o. year old female who is a primary care patient of Suezanne Jacquet, [name], NP-C. The Care Management team was consulted for assistance.      Engaged with patient via telephone    Goals Addressed             This Visit's Progress    Care Management       Current Barriers:  Care Management support related to Mixed Bipolar Disorder, Hypertension and mobility limitations Lacks caregiver support.  Corporate treasurer.  Transportation barriers Housing needs  Planned Interventions: Outreach with Ms. Mink regarding plan since discharge from [facility] on 12/25/22. Reports feeling well today. Reports having all required medications.   She is currently in [place]. Reports she will be in an extended stay hotel until 01/01/23.  The [facility] hospital team made arrangements for her to return to [place] via bus however she forgot to obtain the ticket at discharge. She is aware that she will need to be evaluated by a provider prior to receiving additional medications. She was scheduled for an appointment with her PCP on 12/25/22, however opted to stay in [place] until 01/01/23. Thorough discussion regarding long term needs. Discussed plan for treatment at [facility] in [place]. She prefers not to stay in a group setting. Reports other facilities were offered. Reports their primary focus was drug rehab/detox making her ineligible.  She is aware that housing is very limited d/t her limited income. Returning to her previous rental unit is not an option due to the unit being rented out. Her current options include temporary placement at a shelter until housing is available. She prefers to return to [place] for temporary shelter at [facility] instead of seeking shelter in the [place] area. Discussed possible options and support from family in other states. Reports speaking with a  brother in [place]. He is willing to assist her with phone services however she was not offered housing or additional financial assistance. She has not received updates regarding housing or additional support from her brother in [place]. Reviewed safety and fall prevention measures. Reports her neuropathy has worsened. She is able to ambulate with a walker. Reports going to a local [employer] earlier today without complications. Thorough discussion regarding worsening s/sx that require immediate medical attention.  Update 12/29/22: Call received from [name]. Reports experiencing severe pain in her upper and lower extremities. Reports the pain worsened last night around 7 pm. Reports taking medication for neuropathy as prescribed however pain has not subsided. Reports her mobility has also worsened d/t the pain. She is requesting an order for additional pain medication today. Advised that she needs to be evaluated. Advised that emergency services be notified for pick up from the extended stay hotel. She declined and prefers not to return to the ER or local hospital. Requested assistance with arranging transportation back to [place]. An appointment with her PCP was booked for 01/01/23 at 3:40. A conference call was made today with [facility] regarding transportation benefit. Representative advised Ms. Messana that her current plan only covers ambulance/Emergent transport. The representative agreed to submit a request for a waiver today, however notes it will likely be denied. She was again advised to seek evaluation in the nearest Urgent Care or Emergency Room d/t her severe pain and decreased ambulation. Ms. Kiesow declined. She does not feel that emergent evaluation is currently needed. Reports that she  will rest today until pain decreases. Reviewed worsening symptoms and indications for calling 911.  Update 12/30/22: Ms. Kizer reports feeling better today. Reports episodes of 6/10 or 7/10 pain but reports it  has been tolerable today. Denies falls.  Reports she plans to return to [place] via [place]. She will need assistance with purchasing the ticket. Reports speaking with friends in [place] that may be able to provide minimal financial support including assistance with hotel charges. Reports her current plan is to return to [place] and seek temporary housing at a local shelter. Reports a friend is keeping her vehicle in [place]. Reports she anticipates being able to drive when she arrives.  Reports speaking with her Medicaid point of contact today. Her current coverage only provides assistance with insurance premiums. Reports the point of contact will assist her with reapplying for medicaid benefits.  Collaborated with LCSW regarding options for assistance and housing. Received resources regarding available facilities and group homes in the [place] area. Reports resources may also be available via [facility] however the team will need to actually speak with her before arranging shelter. Will discuss options with Ms. Brizzolara during our outreach on 12/31/22. Update 12/31/22: Follow up outreach with Ms. Kullberg regarding resources and pending return to [place]. Provided contact information for [facility]. She agreed to contact the team today. Discussed plan for transportation. She has opted not to return via bus or Amtrak d/t mobility limitations. Reports contacting a member of her religious community/[name] on yesterday. He is located in [place]. Reports requesting a ride back to [place]. Attempted to reach [name] today without success. Reports she has also reached out to her religious community in [place] and they are currently attempting to raise funds for a possible rental vehicle. Also reports being provided with funds to assist with hotel cost. Thorough discussion with Ms. Keach regarding personal safety, increased risks for health complications  and overall well being as it pertains to pending homelessness. Discussed her options for facility or group home placement. She is currently stable and may not qualify for inpatient mental health placement, however she does qualify for placement in certain group facilities. Discussed benefits of having shelter, food, transportation, medications, assistance with medical needs, and connection with available resources during the upcoming winter months if she opts for temporary placement in a group setting. She verbalized understanding of the benefits. Declined referral for SW to assist with placement in a group setting. She is aware that long term availability in a shelter is not guaranteed. Reports being in a group setting in the past and prefers to maintain full control of her finances. She currently remains adamantly opposed to placement in a group setting. Reports she would rather stay in her car and in a shelter. Discussed pending plan. She is scheduled to check out of the extended stay hotel and return to [facility]. She is scheduled for a clinic visit with her primary care provider at 3:40 tomorrow. She is currently using the hotel phone and remains unsure when she will receive new phone services. Reports her brother is assisting. Reviewed worsening s/sx and indications for seeking immediate medical attention. She will contact the care team for assistance as needed when she returns to [facility]   Patient Goals/Self-Care Take medications as prescribed Follow recommended safety and fall prevention measures Seek immediate medical attention if your condition worsens.         PLAN Ms. Stegenga will contact the care team for assistance as needed when  she returns to [facility]  [facility], [facility] RN Care Manager Direct Dial: [phone] Website: [email or web address]

## 2023-01-01 NOTE — Patient Outreach (Signed)
Care Management   Visit Note  01/01/2023 Name: Shannon [name] MRN: [id number] DOB: March 21, 1965  Subjective: Shannon [name] is a 57 y.o. year old female who is a primary care patient of Shannon [name], Shannon L, NP-C. The Care Management team was consulted for assistance.       Goals Addressed             This Visit's Progress    Care Management       Current Barriers:  Care Management support related to Mixed Bipolar Disorder, Hypertension and mobility limitations Lacks caregiver support.  Corporate treasurer.  Transportation barriers Housing needs  Planned Interventions: Outreach with Shannon [name] regarding plan since discharge from [facility] on 12/25/22. Reports feeling well today. Reports having all required medications.   She is currently in [place]. Reports she will be in an extended stay hotel until 01/01/23.  The [facility] hospital team made arrangements for her to return to [place] via bus however she forgot to obtain the ticket at discharge. She is aware that she will need to be evaluated by a provider prior to receiving additional medications. She was scheduled for an appointment with her PCP on 12/25/22, however opted to stay in [place] until 01/01/23. Thorough discussion regarding long term needs. Discussed plan for treatment at [facility] in [place]. She prefers not to stay in a group setting. Reports other facilities were offered. Reports their primary focus was drug rehab/detox making her ineligible.  She is aware that housing is very limited d/t her limited income. Returning to her previous rental unit is not an option due to the unit being rented out. Her current options include temporary placement at a shelter until housing is available. She prefers to return to [place] for temporary shelter at [facility] instead of seeking shelter in the [place] area. Discussed possible options and support from family in other states. Reports speaking with a brother in [place]. He is  willing to assist her with phone services however she was not offered housing or additional financial assistance. She has not received updates regarding housing or additional support from her brother in [place]. Reviewed safety and fall prevention measures. Reports her neuropathy has worsened. She is able to ambulate with a walker. Reports going to a local [employer] earlier today without complications. Thorough discussion regarding worsening s/sx that require immediate medical attention.  Update 12/29/22: Call received from Shannon [name]. Reports experiencing severe pain in her upper and lower extremities. Reports the pain worsened last night around 7 pm. Reports taking medication for neuropathy as prescribed however pain has not subsided. Reports her mobility has also worsened d/t the pain. She is requesting an order for additional pain medication today. Advised that she needs to be evaluated. Advised that emergency services be notified for pick up from the extended stay hotel. She declined and prefers not to return to the ER or local hospital. Requested assistance with arranging transportation back to [place]. An appointment with her PCP was booked for 01/01/23 at 3:40. A conference call was made today with Shannon [name] regarding transportation benefit. Representative advised Shannon [name] that her current plan only covers ambulance/Emergent transport. The representative agreed to submit a request for a waiver today, however notes it will likely be denied. She was again advised to seek evaluation in the nearest Urgent Care or Emergency Room d/t her severe pain and decreased ambulation. Shannon [name] declined. She does not feel that emergent evaluation is currently needed. Reports that she will rest today until pain decreases. Reviewed  worsening symptoms and indications for calling 911.  Update 12/30/22: Shannon [name] reports feeling better today. Reports episodes of 6/10 or 7/10 pain but reports it has been tolerable  today. Denies falls.  Reports she plans to return to [place] via [place]. She will need assistance with purchasing the ticket. Reports speaking with friends in [name] that may be able to provide minimal financial support including assistance with hotel charges. Reports her current plan is to return to [place] and seek temporary housing at a local shelter. Reports a friend is keeping her vehicle in [place]. Reports she anticipates being able to drive when she arrives.  Reports speaking with her Medicaid point of contact today. Her current coverage only provides assistance with insurance premiums. Reports the point of contact will assist her with reapplying for medicaid benefits.  Collaborated with LCSW regarding options for assistance and housing. Received resources regarding available facilities and group homes in the [place] area. Reports resources may also be available via [facility] however the team will need to actually speak with her before arranging shelter. Will discuss options with Shannon [name] during our outreach on 12/31/22. Update 12/31/22: Follow up outreach with Shannon [name] regarding resources and pending return to [place]. Provided contact information for [facility]. She agreed to contact the team today. Discussed plan for transportation. She has opted not to return via bus or Amtrak d/t mobility limitations. Reports contacting a member of her religious community/Shannon [name] on yesterday. He is located in [place]. Reports requesting a ride back to [place]. Attempted to reach Shannon [name] today without success. Reports she has also reached out to her religious community in [place] and they are currently attempting to raise funds for a possible rental vehicle. Also reports being provided with funds to assist with hotel cost. Thorough discussion with Shannon [name] regarding personal safety, increased risks for [name] complications and overall well  being as it pertains to pending homelessness. Discussed her options for facility or group home placement. She is currently stable and may not qualify for inpatient mental [name] placement, however she does qualify for placement in certain group facilities. Discussed benefits of having shelter, food, transportation, medications, assistance with medical needs, and connection with available resources during the upcoming winter months if she opts for temporary placement in a group setting. She verbalized understanding of the benefits. Declined referral for SW to assist with placement in a group setting. She is aware that long term availability in a shelter is not guaranteed. Reports being in a group setting in the past and prefers to maintain full control of her finances. She currently remains adamantly opposed to placement in a group setting. Reports she would rather stay in her car and in a shelter. Discussed pending plan. She is scheduled to check out of the extended stay hotel and return to [facility]. She is scheduled for a clinic visit with her primary care provider at 3:40 tomorrow. She is currently using the hotel phone and remains unsure when she will receive new phone services. Reports her brother is assisting. Reviewed worsening s/sx and indications for seeking immediate medical attention. She will contact the care team for assistance as needed when she returns to [facility] Update 01/01/23: Call received from Ms. Kilroy's support/friend [name] indicating she arrived in [place]. Relayed message to PCP. PCP confirmed that she arrived for her scheduled appointment however she is suicidal. 911 was contacted by clinic staff to transport her to the hospital.  Will update LCSW and Inpatient Liaison.  Patient Goals/Self-Care Take medications as prescribed Follow recommended safety and fall prevention measures Seek immediate medical attention if your condition worsens.         PLAN Will  update LCSW and Inpatient Liaison RNCM follow up pending discharge/disposition   Katina Degree [name]  [name], [facility] RN Care Manager Direct Dial: [phone] Website: [email or web address]

## 2023-01-01 NOTE — ED Notes (Signed)
Patient was upset that she was given tylenol for her pain. This writer was in another room when I heard patient getting loud outside the door with staff. Patient was trying to leave and MD Lynelle Doctor came and talked with patient. Patient was being vulgar and rude with staff. Security was called. MD notified me he was IVC her. Patient continued to be loud and cussing at staff. Patient committing we was racist and saying we was clowns laughing at a black women. MD placed orders for medications. Geodon was given to patient with the assistance from security.

## 2023-01-01 NOTE — ED Notes (Signed)
Patient at this time is sitting at the end of the bed alert and oriented. Patient is staying in the bed at this time. Sitter is still with patient.

## 2023-01-01 NOTE — BH Assessment (Signed)
Per RN, Pt received Geodon and is currently too somnolent to participate in tele-assessment.   Pamalee Leyden, [facility], [facility] Triage Specialist

## 2023-01-01 NOTE — ED Notes (Signed)
Pt has been changed into burgundy scrubs.   

## 2023-01-01 NOTE — ED Triage Notes (Signed)
Pt states she is depressed and has racing thoughts.  Pt states she was in [place] and came home because she was out of her meds for bipolar and neuropathy.  States she has been living in a hotel and "today was going to be the day, I was going to take all my pills at once and then it would not matter that I have been in pain and have nothing."

## 2023-01-02 ENCOUNTER — Encounter (HOSPITAL_COMMUNITY): Payer: Self-pay | Admitting: Psychiatric/Mental Health

## 2023-01-02 DIAGNOSIS — R45851 Suicidal ideations: Secondary | ICD-10-CM

## 2023-01-02 DIAGNOSIS — Z609 Problem related to social environment, unspecified: Secondary | ICD-10-CM | POA: Diagnosis not present

## 2023-01-02 DIAGNOSIS — F315 Bipolar disorder, current episode depressed, severe, with psychotic features: Secondary | ICD-10-CM | POA: Diagnosis not present

## 2023-01-02 LAB — RAPID URINE DRUG SCREEN, HOSP PERFORMED
Amphetamines: NOT DETECTED
Barbiturates: NOT DETECTED
Benzodiazepines: NOT DETECTED
Cocaine: NOT DETECTED
Opiates: NOT DETECTED
Tetrahydrocannabinol: NOT DETECTED

## 2023-01-02 LAB — SARS CORONAVIRUS 2 BY RT PCR: SARS Coronavirus 2 by RT PCR: NEGATIVE

## 2023-01-02 MED ORDER — OLANZAPINE 10 MG IM SOLR
10.0000 mg | Freq: Once | INTRAMUSCULAR | Status: AC | PRN
  Administered 2023-01-02: 10 mg via INTRAMUSCULAR
  Filled 2023-01-02: qty 10

## 2023-01-02 MED ORDER — POTASSIUM CHLORIDE CRYS ER 20 MEQ PO TBCR
20.0000 meq | EXTENDED_RELEASE_TABLET | Freq: Once | ORAL | Status: AC
  Administered 2023-01-02: 20 meq via ORAL
  Filled 2023-01-02: qty 1

## 2023-01-02 MED ORDER — STERILE WATER FOR INJECTION IJ SOLN
INTRAMUSCULAR | Status: AC
  Filled 2023-01-02: qty 10

## 2023-01-02 NOTE — Progress Notes (Signed)
Patient has been denied by [facility] due to no appropriate beds available. Patient meets BH inpatient criteria per Tyler Aas, NP. Patient has been faxed out to the following facilities:    [facility]  [address] [place] [address] [phone]  [facility]  [address] [place] [address] [phone]  [facility]  [address] [place] [address] [phone]  [facility]  [address] [place] [address] [phone]  [facility]  [address], [place] [address] [phone]  [facility] EFAX  [address], [place] [phone]  [facility]  [address], [place] [address] [phone]  [facility]  [address] [place] [address] [phone]  [facility] Patient Placement  [facility], [place] [phone]  [facility]  [address] [place], [place] [address] [phone]  [facility]  [address] [place] [address] [phone]  [facility]  [address] [place] [address] [phone]  [facility]  [address], [place] [address] [phone]  [facility]  [address] [place] [address] [phone]  [facility]  [address], [place] [address] [phone]  [facility]  [address], [place] [address] [phone]   Damita Dunnings, MSW, LCSW-A  1:28 PM 01/02/2023

## 2023-01-02 NOTE — ED Notes (Addendum)
Taquan from [facility] called. She wanted to know whether patient needed medical care and if she is able to perform her ADLs. She said that she would have a nurse call us back wit a decision

## 2023-01-02 NOTE — BH Assessment (Signed)
TTS requested tele-psychiatry consult with [facility]. Created secure conversation including EDP, Pt's RN, and [facility] Coordin[facility] to facilitate consult. [facility] Coordinator states Tyler Aas, NP will see Pt at 0330.   Pamalee Leyden, [facility], [facility] Triage Specialist

## 2023-01-02 NOTE — Progress Notes (Signed)
[facility] LCSW Progress Note   01/02/2023    2:22 PM  Shannon [name]   [id number]   Type of Contact and Topic:  Psychiatric Bed Placement   Pt accepted to [facility]     Patient meets inpatient criteria per Dahlia Byes, NP   The attending provider will be Dr. Roselyn Reef   Call report to [phone]  Dessa Phi, RN @ [facility] ED notified.     Pt scheduled  to arrive at [facility] for TOMOR[facility].    Damita Dunnings, MSW, LCSW-A  2:23 PM 01/02/2023

## 2023-01-02 NOTE — Consult Note (Addendum)
[facility] Consult Note  Patient Name: Shannon [name] MRN: [id number] DOB: [birth year] DATE OF Consult: 01/02/2023  PRIMARY PSYCHIATRIC DIAGNOSES  1.  Bipolar I Disorder, current episode depressed, severe, with psychotic features 2.  Suicidal Ideations   RECOMMENDATIONS  Recommendations: Medication recommendations: Continue home medication regimen: -- Prozac 20mg  po daily for depression and anxiety -- Klonopin 0.5mg  po BID PRN for anxiety -- Seroquel 25mg  po daily and 300mg  po at bedtime for mood stabilization, anxiety, psychosis  -- Gabapentin 300mg  po at bedtime for sleep   -- Olanzapine 10mg  PO/IM Q6H PRN for acute agitation. Please do not exceed 30mg  of Olanzapine in a 24 hour time period. Do not give IM Olanzapine with IM/IV benzodiazepines as simultaneous administration may cause respiratory depression.   Non-Medication/therapeutic recommendations:  -- IVC petition -- Inpatient psychiatric admission -- Suicide precautions, 1:1 sitter    Is inpatient psychiatric hospitalization recommended for this patient?  -- Yes, Patient is an imminent danger to herself- suicidal ideations with plan to overdose   Communication: Treatment team members (and family members if applicable) who were involved in treatment/care discussions and planning, and with whom we spoke or engaged with via secure text/chat, include the following: Sharilyn Sites, PA-C and ED primary team via [employer]  Thank you for involving [name] in the care of this patient. If you have any additional questions or concerns, please call [phone] and ask for me or the provider on-call.  TELEPSYCHIATRY ATTESTATION & CONSENT  As the provider for this telehealth consult, I attest that I verified the patient's identity using two separate identifiers, introduced myself to the patient, provided my credentials, disclosed my location, and performed this encounter via a HIPAA-compliant, real-time, face-to-face, two-way, interactive  audio and video platform and with the full consent and agreement of the patient (or guardian as applicable.)  Patient physical location: ED in [facility]. Telehealth provider physical location: home office in state of [place].  Video start time: 0246 (Central Time) Video end time: 0303 (Central Time)  IDENTIFYING DATA  Shannon [name] is a 57 y.o. year-old female for whom a psychiatric consultation has been ordered by the primary provider. The patient was identified using two separate identifiers.  CHIEF COMPLAINT/REASON FOR CONSULT  Suicidal Ideations, Depression    HISTORY OF PRESENT ILLNESS (HPI)  The patient is a 57yo female who presented to the emergency department with chief complaint of worsening depression and suicidal ideations with a plan to overdose on her medications.   Patient got agitated while in the ED. Wanting to leave, stating that staff is racist. Was verbally aggressive with staff, not redirectable. Security called and given Geodon 20mg  IM @ 2058. Patient placed on IVC petition.   Patient evaluated 1:1, calm and cooperative. Laying in hospital bed with eyes closed. Patient states "I want to kill myself, I'm going to kill myself, as soon as I get home. They took all my stuff and once I leave I'll get my belongings back and take my medicine". Patient endorses suicidal ideations with plan to overdose on her medications. Reports she has felt suicidal since her return from [place] a few weeks ago. Patient states she has overdosed in the past "the medicine is a quick way and it doesn't hurt".   Patient states when she returned from [place] she "lost everything. What made me think I could go and live in [place]? I spent all my money to go to [place]. I don't have people over there. Why was I  in [place]". It seems patient made an impulsive decision to travel to [place] in August 2024. States she was staying with "these Muslim people who were trying to marry me for their green  card". Patient states she didn't have her medicine over there, tried to go on without it but now is suffers with significant pain.   Complains of command type auditory hallucinations telling her to "kill yourself kill yourself kill yourself". Endorsing racing thoughts. She is homeless, complains of isolating, feeling alone. Lacks social/ family support.      PAST PSYCHIATRIC HISTORY  Per chart review, patient with past psychiatric history of paranoia, suicidal ideations, hallucinations, anxiety, mania. Bipolar I Disorder, PTSD.  Multiple inpatient hospitalizations Attends [facility]- Dr. Kathi Ludwig  Past suicide attempt by overdose   Otherwise as per HPI above.  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   DISC DISEASE, LUMBAR 01/25/2009   Essential hypertension 01/25/2009   On Bystolic    GAD (generalized anxiety disorder) 12/28/2013   GERD (gastroesophageal reflux disease)    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   Headache, acute 12/20/2013   Hx of laparoscopic gastric banding 09/03/2010   Surgery date: 07/17/10    HYPERLIPIDEMIA 01/25/2009   Hyperlipidemia with target LDL less than 130 01/25/2009   HYPERTENSION 01/25/2009   Hypokalemia, inadequate intake 09/01/2011   Insomnia 04/04/2011   INSOMNIA-SLEEP DISORDER-UNSPEC 04/30/2009   Qualifier: Diagnosis of  By: Jonny Ruiz MD, Len Blalock    Iron deficiency anemia 01/25/2009        Labyrinthitis 02/19/2014   Leiomyoma of uterus, unspecified 08/13/2008   Overview:  Leiomyoma Of The Uterus  10/1 IMO update    Localized edema 06/12/2015   MANIC DEPRESSIVE ILLNESS 01/25/2009   pt is unsue if this is her specifc dx   Mixed bipolar I disorder (HCC) 07/12/2012   Morbid obesity (HCC) 01/25/2009   Night sweats    Osteopenia determined by x-ray 06/13/2021   Other abnormal glucose 10/26/2012   Palpitations 10/25/2013   PEPTIC ULCER DISEASE, HELICOBACTER PYLORI POSITIVE 10/03/2009   Peripheral edema 06/25/2015   Pernicious anemia 03/28/2012   Piriformis syndrome of both sides 09/02/2018   PUD (peptic ulcer disease) 10/03/2009        Right shoulder pain 05/03/2013   Dg Shoulder Right  05/03/2013   CLINICAL DATA Pain.  EXAM RIGHT SHOULDER - 2+ VIEW  COMPARISON None.  FINDINGS Acromioclavicular and glenohumeral degenerative change present. Questionable calcific density noted in the region of the supraspinatus space, possibly representing calcific supraspinatus tendinitis. This could represent a sclerotic density in the acromion. MRI of the right shoulder suggest   Sleep apnea 08/12/2016   SMOKER 01/25/2009   Qualifier: Diagnosis of  By: Jonny Ruiz MD, Len Blalock    Subacromial bursitis 05/17/2013   SUBSTANCE ABUSE 11/06/2009        Thiamin deficiency 10/30/2012   Tobacco abuse 06/09/2010   Visual disturbance 05/03/2013   Vitamin D deficiency 10/27/2012     HOME MEDICATIONS  Facility Ordered Medications  Medication   potassium chloride SA (KLOR-CON M) CR tablet 40 mEq   atorvastatin (LIPITOR) tablet 10 mg   clonazePAM (KLONOPIN) tablet 0.5 mg   FLUoxetine (PROZAC) capsule 20 mg   gabapentin (NEURONTIN) capsule 300 mg   amLODipine (NORVASC) tablet 5 mg   acetaminophen (TYLENOL) tablet 650 mg   [COMPLETED] ziprasidone (GEODON) injection 20 mg   [COMPLETED] sterile water (preservative free) injection   [COMPLETED] potassium chloride SA (KLOR-CON M) CR tablet 20 mEq   PTA Medications  Medication Sig   amLODipine (NORVASC) 5 MG tablet Take 1 tablet (5 mg total) by mouth daily. (Patient not taking: Reported on 08/28/2022)    Multiple Vitamins-Minerals (BARIATRIC MULTIVITAMINS/IRON) CAPS Take 1 capsule by mouth daily. (Patient not taking: Reported on 09/03/2022)   pantoprazole (PROTONIX) 40 MG tablet Take 1 tablet (40 mg total) by mouth 2 (two) times daily before a meal.   glycerin adult 2 g suppository Place 1 suppository rectally as needed for constipation. (Patient not taking: Reported on 09/03/2022)   IMODIUM A-D 2 MG tablet Take 2 mg by mouth 4 (four) times daily as needed for diarrhea or loose stools. (Patient not taking: Reported on 09/03/2022)   gabapentin (NEURONTIN) 300 MG capsule TAKE 1 CAPSULE (300 MG TOTAL) BY MOUTH AT BEDTIME.   atorvastatin (LIPITOR) 20 MG tablet TAKE ONE TABLET BY MOUTH ONCE DAILY FOR CHOLESTEROL   clonazePAM (KLONOPIN) 0.5 MG tablet Take 1 tablet (0.5 mg total) by mouth 2 (two) times daily as needed for anxiety.   FLUoxetine (PROZAC) 20 MG capsule Take 1 capsule (20 mg total) by mouth daily.   QUEtiapine (SEROQUEL) 25 MG tablet Take 1 tablet (25 mg total) by mouth 2 (two) times daily.   QUEtiapine (SEROQUEL) 300 MG tablet Take 1 tablet (300 mg total) by mouth at bedtime.     ALLERGIES  Allergies  Allergen Reactions   Lamictal [Lamotrigine] Rash    SOCIAL & SUBSTANCE USE HISTORY  Social History   Socioeconomic History   Marital status: Divorced    Spouse name: Not on file   Number of children: 2   Years of education: Not on file   Highest education level: Professional school degree (e.g., MD, DDS, DVM, JD)  Occupational History   Occupation: STUDENT    Employer: UNEMPLOYED  Tobacco Use   Smoking status: Every Day    Current packs/day: 0.25    Average packs/day: 0.3 packs/day for 20.0 years (5.0 ttl pk-yrs)    Types: Cigarettes   Smokeless tobacco: Never   Tobacco comments:    States has cut back to 5-7 a day and working on this.   Vaping Use   Vaping status: Never Used  Substance and Sexual Activity   Alcohol use: No    Alcohol/week: 0.0 standard drinks of alcohol    Drug use: No   Sexual activity: Not Currently  Other Topics Concern   Not on file  Social History Narrative   Moved from [place], then [place] to [place] 2009   2 children-1 boy, 1 girl   Disabled-bipolar   Daily Caffeine Use-1 cup/day      Right Handed   Lives in 2nd floor condo   Social Determinants of Health   Financial Resource Strain: High Risk (01/01/2023)   Overall Financial Resource Strain (CARDIA)    Difficulty of Paying Living Expenses: Very hard  Food Insecurity: Food Insecurity Present (01/01/2023)   Hunger Vital Sign    Worried About Running Out of  Food in the Last Year: Often true    Ran Out of Food in the Last Year: Often true  Transportation Needs: Patient Declined (01/01/2023)   PRAPARE - Administrator, Civil Service (Medical): Patient declined    Lack of Transportation (Non-Medical): Patient declined  Physical Activity: Inactive (01/01/2023)   Exercise Vital Sign    Days of Exercise per Week: 0 days    Minutes of Exercise per Session: 0 min  Stress: Stress Concern Present (01/01/2023)   [employer] of Occupational Health - Occupational Stress Questionnaire    Feeling of Stress : Very much  Social Connections: Socially Isolated (01/01/2023)   Social Connection and Isolation Panel [NHANES]    Frequency of Communication with Friends and Family: Never    Frequency of Social Gatherings with Friends and Family: Never    Attends Religious Services: 1 to 4 times per year    Active Member of [employer] or Organizations: No    Attends Engineer, structural: Not on file    Marital Status: Separated   Social History   Tobacco Use  Smoking Status Every Day   Current packs/day: 0.25   Average packs/day: 0.3 packs/day for 20.0 years (5.0 ttl pk-yrs)   Types: Cigarettes  Smokeless Tobacco Never  Tobacco Comments   States has cut back to 5-7 a day and working on this.    Social History   Substance and Sexual Activity  Alcohol Use No    Alcohol/week: 0.0 standard drinks of alcohol   Social History   Substance and Sexual Activity  Drug Use No    Additional pertinent information homelessness; previously living in hotel.   FAMILY HISTORY  Family History  Problem Relation Age of Onset   Hypertension Mother    Stroke Father    Cirrhosis Father        ETOH   Hypertension Father    Diabetes Father    Alcohol abuse Father    Hypertension Sister    Asthma Sister    Hypertension Brother    Hypertension Paternal Aunt    Diabetes Maternal Grandmother    ADD / ADHD Son    ADD / ADHD Other        3 nephews, also emotional issues   Diabetes Other        Uncle   Diabetes Other        Aunt   Suicidality Neg Hx      MENTAL STATUS EXAM (MSE)  Presentation  General Appearance: Appropriate for Environment Eye Contact:Poor Speech:Slow; Clear and Coherent Speech Volume:Decreased Handedness:No data recorded  Mood and Affect  Mood:Depressed Affect:Constricted  Thought Process  Thought Processes:Coherent; Linear Descriptions of Associations:Intact  Orientation:Full (Time, Place and Person)  Thought Content:Logical  History of Schizophrenia/Schizoaffective disorder:No data recorded Duration of Psychotic Symptoms:No data recorded Hallucinations:Hallucinations: Auditory; Command Description of Command Hallucinations: "kill yourself kill yourself kill yourself" Description of Auditory Hallucinations: command type auditory hallucinations telling her to harm herself  Ideas of Reference:None  Suicidal Thoughts:Suicidal Thoughts: Yes, Active SI Active Intent and/or Plan: With Plan; With Intent; With Means to Carry Out  Homicidal Thoughts:Homicidal Thoughts: No   Sensorium  Memory:Remote Fair; Recent Good Judgment:Poor Insight:Poor  Executive Functions  Concentration:Fair Attention Span:Fair Recall:Fair Fund of Knowledge:Fair Language:Good  Psychomotor Activity  Psychomotor Activity:Psychomotor  Activity: Decreased  Assets  Assets:Communication Skills  Sleep  Sleep:Sleep: Poor   VITALS  Blood pressure 136/84, pulse 97, temperature 97.8 F (36.6 C), temperature source Oral, resp. rate 18,  height 5\' 4"  (1.626 m), weight 81.6 kg, SpO2 93%.  LABS  Admission on 01/01/2023  Component Date Value Ref Range Status   Sodium 01/01/2023 133 (L)  135 - 145 mmol/L Final   Potassium 01/01/2023 2.8 (L)  3.5 - 5.1 mmol/L Final   Chloride 01/01/2023 91 (L)  98 - 111 mmol/L Final   CO2 01/01/2023 31  22 - 32 mmol/L Final   Glucose, Bld 01/01/2023 102 (H)  70 - 99 mg/dL Final   Glucose reference range applies only to samples taken after fasting for at least 8 hours.   BUN 01/01/2023 11  6 - 20 mg/dL Final   Creatinine, Ser 01/01/2023 1.18 (H)  0.44 - 1.00 mg/dL Final   Calcium 16/11/9602 8.8 (L)  8.9 - 10.3 mg/dL Final   Total Protein 54/10/8117 8.7 (H)  6.5 - 8.1 g/dL Final   Albumin 14/78/2956 3.3 (L)  3.5 - 5.0 g/dL Final   AST 21/30/8657 13 (L)  15 - 41 U/L Final   ALT 01/01/2023 12  0 - 44 U/L Final   Alkaline Phosphatase 01/01/2023 89  38 - 126 U/L Final   Total Bilirubin 01/01/2023 0.5  <1.2 mg/dL Final   GFR, Estimated 01/01/2023 54 (L)  >60 mL/min Final   Comment: (NOTE) Calculated using the CKD-EPI Creatinine Equation (2021)    Anion gap 01/01/2023 11  5 - 15 Final   Performed at [facility], [address] [place], [place] [address]   Alcohol, Ethyl (B) 01/01/2023 <10  <10 mg/dL Final   Comment: (NOTE) Lowest detectable limit for serum alcohol is 10 mg/dL.  For medical purposes only. Performed at [facility], [address] [place], [place] [address]    WBC 01/01/2023 13.0 (H)  4.0 - 10.5 K/uL Final   RBC 01/01/2023 4.82  3.87 - 5.11 MIL/uL Final   Hemoglobin 01/01/2023 12.4  12.0 - 15.0 g/dL Final   HCT 41/32/4401 38.8  36.0 - 46.0 % Final   MCV 01/01/2023 80.5  80.0 - 100.0 fL Final   MCH 01/01/2023 25.7 (L)  26.0 - 34.0 pg  Final   MCHC 01/01/2023 32.0  30.0 - 36.0 g/dL Final   RDW 02/72/5366 16.7 (H)  11.5 - 15.5 % Final   Platelets 01/01/2023 417 (H)  150 - 400 K/uL Final   nRBC 01/01/2023 0.0  0.0 - 0.2 % Final   Neutrophils Relative % 01/01/2023 71  % Final   Neutro Abs 01/01/2023 9.1 (H)  1.7 - 7.7 K/uL Final   Lymphocytes Relative 01/01/2023 21  % Final   Lymphs Abs 01/01/2023 2.7  0.7 - 4.0 K/uL Final   Monocytes Relative 01/01/2023 6  % Final   Monocytes Absolute 01/01/2023 0.8  0.1 - 1.0 K/uL Final   Eosinophils Relative 01/01/2023 1  % Final   Eosinophils Absolute 01/01/2023 0.2  0.0 - 0.5 K/uL Final   Basophils Relative 01/01/2023 0  % Final   Basophils Absolute 01/01/2023 0.1  0.0 - 0.1 K/uL Final   Immature Granulocytes 01/01/2023 1  % Final   Abs Immature Granulocytes 01/01/2023 0.07  0.00 - 0.07 K/uL Final   Performed at [facility], [address], [place], [place] [address]    PSYCHIATRIC REVIEW OF SYSTEMS (ROS)  ROS: Notable for the following relevant positive findings: Review of Systems  Psychiatric/Behavioral:  Positive for depression, hallucinations and suicidal ideas. The patient has insomnia.     Additional findings:  Musculoskeletal: No abnormal movements observed      Gait & Station: Laying/Sitting      Pain Screening: Present - mild to moderate      Nutrition & Dental Concerns: Decrease in food intake and/or loss of appetite  RISK FORMULATION/ASSESSMENT  Is the patient experiencing any suicidal or homicidal ideations: Yes       Explain if yes: SI with plan to overdose on medications  Protective factors considered for safety management: access to care   Risk factors/concerns considered for safety management: active SI with plan Prior attempt Depression Physical illness/chronic pain Hopelessness Impulsivity Isolation Unmarried  Is there a safety management plan with the patient and treatment team to minimize risk factors and promote protective  factors: Yes           Explain: Patient currently in the ED, medication management, IVC petition, suicide precautions, inpatient psychiatric stabilization  Is crisis care placement or psychiatric hospitalization recommended: Yes     Based on my current evaluation and risk assessment, patient is determined at this time to be at:  High risk  *RISK ASSESSMENT Risk assessment is a dynamic process; it is possible that this patient's condition, and risk level, may change. This should be re-evaluated and managed over time as appropriate. Please re-consult psychiatric consult services if additional assistance is needed in terms of risk assessment and management. If your team decides to discharge this patient, please advise the patient how to best access emergency psychiatric services, or to call 911, if their condition worsens or they feel unsafe in any way.   Assunta Gambles, NP Telepsychiatry Consult Services

## 2023-01-02 NOTE — ED Notes (Signed)
Patient is sleeping and has a Comptroller. I was able to see the rise and fall of her chest.

## 2023-01-02 NOTE — ED Notes (Signed)
RN reminded patient and sitter that urine sample was needed, clear understanding voiced by both

## 2023-01-02 NOTE — ED Provider Notes (Signed)
Emergency Medicine Observation Re-evaluation Note  MELESIA AUDET is a 57 y.o. female, seen on rounds today.  Pt initially presented to the ED for complaints of No chief complaint on file. Currently, the patient is not having any acute complaints.  Physical Exam  BP 139/72 (BP Location: Left Wrist)   Pulse (!) 106   Temp 98.2 F (36.8 C) (Oral)   Resp 18   Ht 5\' 4"  (1.626 m)   Wt 81.6 kg   SpO2 94%   BMI 30.90 kg/m  Physical Exam General: Resting comfortably in stretcher Lungs: Normal work of breathing Psych: Calm  ED Course / MDM  EKG:   I have reviewed the labs performed to date as well as medications administered while in observation.  Recent changes in the last 24 hours include was agitated yesterday and received Geodon.  Psychiatry is seen and gave recommendations on medications.  Accepted to [facility] tomorrow but does need COVID and drug screen.  Plan  Current plan is for COVID and drug screen.  [facility] [employer].    Rondel Baton, MD 01/02/23 [phone]

## 2023-01-02 NOTE — ED Notes (Addendum)
Lucille Passy from [facility] called about patient's admission. She states that patient will be admitted tomorrow. She would like to have a COVID test and urine drug screen done.

## 2023-01-02 NOTE — ED Notes (Signed)
Pt reports feeling agitated and anxious and wanting calming medicine so she can have a good night. PRN meds administered. Pt now calm and resting

## 2023-01-03 DIAGNOSIS — Z609 Problem related to social environment, unspecified: Secondary | ICD-10-CM | POA: Diagnosis not present

## 2023-01-03 MED ORDER — LORAZEPAM 1 MG PO TABS: 1.0000 mg | ORAL_TABLET | ORAL | Status: DC | PRN

## 2023-01-03 MED ORDER — MELATONIN 3 MG PO TABS
3.0000 mg | ORAL_TABLET | Freq: Every day | ORAL | Status: DC
  Administered 2023-01-03: 3 mg via ORAL
  Filled 2023-01-03: qty 1

## 2023-01-03 MED ORDER — OLANZAPINE 10 MG PO TBDP: 10.0000 mg | ORAL_TABLET | Freq: Three times a day (TID) | ORAL | Status: DC | PRN

## 2023-01-03 MED ORDER — ZIPRASIDONE MESYLATE 20 MG IM SOLR: 20.0000 mg | INTRAMUSCULAR | Status: DC | PRN

## 2023-01-03 NOTE — ED Notes (Signed)
Unsuccessful attempt to contact [facility] x2. Will try again later.

## 2023-01-03 NOTE — ED Provider Notes (Addendum)
Emergency Medicine Observation Re-evaluation Note  Shannon [name] is a 57 y.o. female, seen on rounds today.  Pt initially presented to the ED for complaints of No chief complaint on file.   Physical Exam  BP (!) 157/80 (BP Location: Right Arm)   Pulse (!) 105   Temp 98.7 F (37.1 C) (Oral)   Resp 16   Ht 5\' 4"  (1.626 m)   Wt 81.6 kg   SpO2 93%   BMI 30.90 kg/m  Physical Exam Neurological:     Mental Status: She is alert.      ED Course / MDM  EKG:   I have reviewed the labs performed to date as well as medications administered while in observation.  Recent changes in the last 24 hours include nothing.  Plan  Current plan is for [facility] today?  [facility], dr creque.  Virgina Norfolk, DO 01/03/23 [id number]    Virgina Norfolk, DO 01/03/23 1238

## 2023-01-03 NOTE — ED Notes (Signed)
Left voicemail for sheriff that transport is needed today.

## 2023-01-03 NOTE — ED Notes (Signed)
Left VM at [phone] for transport. No answer.

## 2023-01-03 NOTE — ED Notes (Signed)
Report given Thayer Ohm, Charity fundraiser at [facility].

## 2023-01-04 NOTE — Consult Note (Signed)
   [facility] CM Inpatient Consult   01/04/2023  Shannon [name] 1965-03-10 [id number]  Covering Evelina Dun [facility] Liaison)  Primary Care Provider:  Hetty Blend, NP ([place]  at [facility])  Patient is currently active with Care Management for chronic disease management services.  Patient has been engaged by a RNCM.  Our community based plan of care has focused on disease management and community resource support.   Patient will receive a post hospital call and will be evaluated for assessments and disease process education.   Plan: Discharged to [facility] on 01/03/23 as indicated in documentation on the discharge summary.  Of note, Care Management services does not replace or interfere with any services that are needed or arranged by inpatient [facility] care management team.   For additional questions or referrals please contact:  Elliot Cousin, RN, [facility] Liaison [place]   [facility], Population Health Office Hours MTWF  8:00 am-6:00 pm Direct Dial: [phone] mobile [phone] [Office toll free line] Office Hours are M-F 8:30 - 5 pm [email or web address] [name]

## 2023-01-12 ENCOUNTER — Other Ambulatory Visit: Payer: Self-pay

## 2023-01-12 NOTE — Patient Outreach (Addendum)
  Care Management   Visit Note  01/12/2023 Name: Shannon [name] MRN: [id number] DOB: [birth year]  Subjective: Shannon [name] is a 57 y.o. year old female who is a primary care patient of Shannon [name], Shannon L, NP-C. The Care Management team was consulted for assistance.      Care Collaboration:   Goals Addressed             This Visit's Progress    Care Management       Current Barriers:  Care Management support related to Mixed Bipolar Disorder, Hypertension and mobility limitations Lacks caregiver support.  Corporate treasurer.  Transportation barriers Housing needs  Planned Interventions: Call received from [name]. She is currently admitted at [facility] in [place].  She reports doing well with significant improvements to overall mood since being admitted. She endorses several episodes of auditory hallucinations during the first few days of her admission. Reports the hallucinations have subsided. She anticipates being [name] for transition to an assisted living or rehab facility within the next few weeks. Reports speaking with a facility in [place] that may be able to accommodate her. Reports acceptance is pending Medicaid approval. Her ability to complete ADLs remains limited due to mobility. She has collaborated with the assigned SW at [facility] regarding options for placement after discharge. Shannon [name] provided contact information for her brother, Shannon [name]. Reports he lives out of state but is agreeable to traveling to [place] to assist. Reports Medicaid approval is pending due to outstanding vehicle registration fees. Reports Shannon [name] has agreed resolve the issues regarding her vehicle and fees. The care management team will continue collaborating with Shannon [name] and the assigned facility SW, Shannon [name] regarding plan for transition.   Patient Goals/Self-Care Take medications as prescribed Follow recommended safety and fall prevention  measures Seek immediate medical attention if your condition worsens.           PLAN The care team will continue collaborating with Shannon [name] and the assigned facility SW, Shannon [name] regarding plan for transition.   Shannon [name] [facility]  [facility], [facility] RN Care Manager Direct Dial: [phone] Website: [email or web address]

## 2023-01-18 ENCOUNTER — Other Ambulatory Visit: Payer: Self-pay

## 2023-01-26 ENCOUNTER — Other Ambulatory Visit: Payer: Self-pay

## 2023-01-26 ENCOUNTER — Telehealth (HOSPITAL_COMMUNITY): Payer: Self-pay

## 2023-01-26 NOTE — Telephone Encounter (Signed)
Patient was just released from [facility]. I have her discharge paperwork. They gave patient 2 weeks of medication but she can not get in with you until the 19th so she will be short about a week. I will put the d/c summary in your mailbox for review when you come to the office.

## 2023-01-26 NOTE — Telephone Encounter (Signed)
Thanks for the update

## 2023-01-27 ENCOUNTER — Emergency Department (HOSPITAL_COMMUNITY): Payer: Medicare Other

## 2023-01-27 ENCOUNTER — Observation Stay (HOSPITAL_COMMUNITY)
Admission: EM | Admit: 2023-01-27 | Discharge: 2023-02-04 | Disposition: A | Discharge status: Other Acute Inpatient Hospital | Payer: Medicare Other | Attending: Internal Medicine | Admitting: Internal Medicine

## 2023-01-27 ENCOUNTER — Encounter (HOSPITAL_COMMUNITY): Payer: Self-pay | Admitting: Emergency Medicine

## 2023-01-27 ENCOUNTER — Other Ambulatory Visit: Payer: Self-pay

## 2023-01-27 DIAGNOSIS — E876 Hypokalemia: Secondary | ICD-10-CM | POA: Diagnosis not present

## 2023-01-27 DIAGNOSIS — R600 Localized edema: Secondary | ICD-10-CM | POA: Diagnosis not present

## 2023-01-27 DIAGNOSIS — E559 Vitamin D deficiency, unspecified: Secondary | ICD-10-CM | POA: Diagnosis not present

## 2023-01-27 DIAGNOSIS — R0602 Shortness of breath: Secondary | ICD-10-CM | POA: Diagnosis not present

## 2023-01-27 DIAGNOSIS — R609 Edema, unspecified: Secondary | ICD-10-CM | POA: Diagnosis not present

## 2023-01-27 DIAGNOSIS — R770 Abnormality of albumin: Secondary | ICD-10-CM | POA: Diagnosis not present

## 2023-01-27 DIAGNOSIS — R531 Weakness: Secondary | ICD-10-CM

## 2023-01-27 DIAGNOSIS — E8809 Other disorders of plasma-protein metabolism, not elsewhere classified: Secondary | ICD-10-CM | POA: Diagnosis present

## 2023-01-27 DIAGNOSIS — Z79899 Other long term (current) drug therapy: Secondary | ICD-10-CM | POA: Diagnosis not present

## 2023-01-27 DIAGNOSIS — I499 Cardiac arrhythmia, unspecified: Secondary | ICD-10-CM | POA: Diagnosis not present

## 2023-01-27 DIAGNOSIS — F17210 Nicotine dependence, cigarettes, uncomplicated: Secondary | ICD-10-CM | POA: Diagnosis not present

## 2023-01-27 DIAGNOSIS — D649 Anemia, unspecified: Secondary | ICD-10-CM | POA: Diagnosis not present

## 2023-01-27 DIAGNOSIS — R2689 Other abnormalities of gait and mobility: Secondary | ICD-10-CM | POA: Diagnosis not present

## 2023-01-27 DIAGNOSIS — Z59811 Housing instability, housed, with risk of homelessness: Secondary | ICD-10-CM | POA: Diagnosis not present

## 2023-01-27 DIAGNOSIS — M6281 Muscle weakness (generalized): Secondary | ICD-10-CM | POA: Diagnosis not present

## 2023-01-27 DIAGNOSIS — K760 Fatty (change of) liver, not elsewhere classified: Secondary | ICD-10-CM | POA: Insufficient documentation

## 2023-01-27 DIAGNOSIS — R0689 Other abnormalities of breathing: Secondary | ICD-10-CM | POA: Diagnosis not present

## 2023-01-27 DIAGNOSIS — G629 Polyneuropathy, unspecified: Secondary | ICD-10-CM | POA: Insufficient documentation

## 2023-01-27 DIAGNOSIS — I10 Essential (primary) hypertension: Secondary | ICD-10-CM | POA: Diagnosis present

## 2023-01-27 DIAGNOSIS — R6889 Other general symptoms and signs: Secondary | ICD-10-CM | POA: Diagnosis not present

## 2023-01-27 DIAGNOSIS — R2681 Unsteadiness on feet: Secondary | ICD-10-CM | POA: Diagnosis not present

## 2023-01-27 DIAGNOSIS — R110 Nausea: Secondary | ICD-10-CM | POA: Diagnosis not present

## 2023-01-27 DIAGNOSIS — F3160 Bipolar disorder, current episode mixed, unspecified: Secondary | ICD-10-CM | POA: Diagnosis present

## 2023-01-27 DIAGNOSIS — Z59819 Housing instability, housed unspecified: Secondary | ICD-10-CM | POA: Diagnosis present

## 2023-01-27 LAB — COMPREHENSIVE METABOLIC PANEL
ALT: 9 U/L (ref 0–44)
AST: 13 U/L — ABNORMAL LOW (ref 15–41)
Albumin: 2.5 g/dL — ABNORMAL LOW (ref 3.5–5.0)
Alkaline Phosphatase: 75 U/L (ref 38–126)
Anion gap: 8 (ref 5–15)
BUN: 5 mg/dL — ABNORMAL LOW (ref 6–20)
CO2: 29 mmol/L (ref 22–32)
Calcium: 8.3 mg/dL — ABNORMAL LOW (ref 8.9–10.3)
Chloride: 104 mmol/L (ref 98–111)
Creatinine, Ser: 0.85 mg/dL (ref 0.44–1.00)
GFR, Estimated: >60 mL/min (ref >60)
Glucose, Bld: 112 mg/dL — ABNORMAL HIGH (ref 70–99)
Potassium: 2.6 mmol/L — CL (ref 3.5–5.1)
Sodium: 141 mmol/L (ref 135–145)
Total Bilirubin: 0.5 mg/dL (ref <1.2)
Total Protein: 6.5 g/dL (ref 6.5–8.1)

## 2023-01-27 LAB — CBC WITH DIFFERENTIAL/PLATELET
Abs Immature Granulocytes: 0.03 10*3/uL (ref 0.00–0.07)
Basophils Absolute: 0.1 10*3/uL (ref 0.0–0.1)
Basophils Relative: 1 %
Eosinophils Absolute: 0.2 10*3/uL (ref 0.0–0.5)
Eosinophils Relative: 2 %
HCT: 29.7 % — ABNORMAL LOW (ref 36.0–46.0)
Hemoglobin: 9.6 g/dL — ABNORMAL LOW (ref 12.0–15.0)
Immature Granulocytes: 0 %
Lymphocytes Relative: 25 %
Lymphs Abs: 2.3 10*3/uL (ref 0.7–4.0)
MCH: 26.6 pg (ref 26.0–34.0)
MCHC: 32.3 g/dL (ref 30.0–36.0)
MCV: 82.3 fL (ref 80.0–100.0)
Monocytes Absolute: 0.6 10*3/uL (ref 0.1–1.0)
Monocytes Relative: 6 %
Neutro Abs: 6.3 10*3/uL (ref 1.7–7.7)
Neutrophils Relative %: 66 %
Platelets: 332 10*3/uL (ref 150–400)
RBC: 3.61 MIL/uL — ABNORMAL LOW (ref 3.87–5.11)
RDW: 17.5 % — ABNORMAL HIGH (ref 11.5–15.5)
WBC: 9.5 10*3/uL (ref 4.0–10.5)
nRBC: 0 % (ref 0.0–0.2)

## 2023-01-27 LAB — MAGNESIUM: Magnesium: 2 mg/dL (ref 1.7–2.4)

## 2023-01-27 LAB — TROPONIN I (HIGH SENSITIVITY): Troponin I (High Sensitivity): 3 ng/L (ref <18)

## 2023-01-27 LAB — D-DIMER, QUANTITATIVE: D-Dimer, Quant: 0.44 ug{FEU}/mL (ref 0.00–0.50)

## 2023-01-27 MED ORDER — POTASSIUM CHLORIDE 10 MEQ/100ML IV SOLN
10.0000 meq | INTRAVENOUS | Status: AC
  Administered 2023-01-27 – 2023-01-28 (×2): 10 meq via INTRAVENOUS
  Filled 2023-01-27 (×2): qty 100

## 2023-01-27 MED ORDER — POTASSIUM CHLORIDE CRYS ER 20 MEQ PO TBCR
40.0000 meq | EXTENDED_RELEASE_TABLET | Freq: Once | ORAL | Status: AC
  Administered 2023-01-27: 40 meq via ORAL
  Filled 2023-01-27: qty 2

## 2023-01-27 NOTE — ED Triage Notes (Addendum)
Patient presents from a hotel due to weakness. She purchased compression socks and put them on today. After taking meds, she said she was too weak to take them off.   EMS vitals: 68/38 initial, 92/62 final BP  106 HR 90% SPO2 on room air,  100% on 2 L O2 nasal canula 172 CBG 14 RR

## 2023-01-27 NOTE — Patient Outreach (Signed)
  Care Management   Visit Note  01/27/2023 Name: Shannon [name] MRN: [id number] DOB: 1965/03/03  Subjective: Shannon [name] is a 57 y.o. year old female who is a primary care patient of Suezanne Jacquet, [name], NP-C. The Care Management team was consulted for assistance.        Goals Addressed             This Visit's Progress    Care Management       Current Barriers:  Care Management support related to Mixed Bipolar Disorder, Hypertension and mobility limitations Lacks caregiver support.  Corporate treasurer.  Transportation barriers Housing needs  Planned Interventions: Call received from Shannon [name]. She is currently admitted at [facility] in [place].  She reports doing well with significant improvements to overall mood since being admitted. She endorses several episodes of auditory hallucinations during the first few days of her admission. Reports the hallucinations have subsided. She anticipates being [name] for transition to an assisted living or rehab facility within the next few weeks. Reports speaking with a facility in [place] that may be able to accommodate her. Reports acceptance is pending Medicaid approval. Her ability to complete ADLs remains limited due to mobility. She has collaborated with the assigned SW at [facility] regarding options for placement after discharge. Shannon [name] provided contact information for her brother, Shannon [name]. Reports he lives out of state but is agreeable to traveling to [place] to assist. Reports Medicaid approval is pending due to outstanding vehicle registration fees. Reports Shannon [name] has agreed resolve the issues regarding her vehicle and fees. The care management team will continue collaborating with Shannon [name] and the assigned facility SW, Shannon [name] regarding plan for transition. Update 01/18/23: Collaboration with assigned facility SW/Shannon [name]. Shannon [name] reports difficulty with placement for Shannon [name]. Reports  challenge may be d/t the nature of Shannon [name] needs. Notes [name] is considered a crisis stabilization facility, and most patients tend to transition to shelters, drug rehab facilities or half-way homes. Shannon [name] does not meet the requirements for the facilities they currently partner with. The team agrees that transition to a shelter would not be ideal d/t significant mobility limitations. Shannon [name] requested to speak with the assigned Population Health SW to discuss the process for placement in a skilled nursing or similar facility.  Reports Shannon [name]'s condition is currently stable. The facility is currently keeping her onboard due to homelessness. Will message [facility] LCSW regarding Shannon [name]'s request. Update 01/27/23:  Brief outreach with Shannon [name] staff. Reports Shannon [name] was discharged from the unit. Reports speaking with Shannon [name] earlier today but was unable to reach her during the call to confirm Shannon [name]'s current location.  Message left for Shannon [name] to contact the unit or call RNCM when she is available.           PLAN Pending call back    Katina Degree Health  [facility], [facility] Health RN Care Manager Direct Dial: [phone] Website: [email or web address]

## 2023-01-27 NOTE — ED Provider Notes (Signed)
[place] EMERGENCY DEPARTMENT AT [facility] Provider Note   CSN: [id number] Arrival date & time: 01/27/23  2105     History {Add pertinent medical, surgical, social history, OB history to HPI:1} Chief Complaint  Patient presents with   Weakness    Shannon [name] is a 57 y.o. female.  Patient is a 57 year old female with a history of anemia, morbid obesity, chronic pain, bipolar disease, electrolyte disturbances, recent psychiatric admission and discharge yesterday from [facility] [place] who is presenting today with EMS due to feeling generally unwell and lightheaded.  Patient reports for the last 16 days she has had worsening swelling of bilateral legs that has progressed to involve her thighs also with pain.  She has had some intermittent shortness of breath but denies any cough or fever.  She has had occasional nausea and vomiting but denies any abdominal pain.  She denies any urinary symptoms.  She denies ever having history of leg swelling in the past.  Today she went down to the drugstore to get some compression socks to put on her legs.  She did get them on but when she was trying to get them off she was having a hard time getting it off one of her legs.  She also reports taking her evening time medications which made her feel very sleepy.  However when she was unable to get the other compression sock off and feeling unwell she called 911.  She reports by the time she got to the door to open to let EMS in she felt very lightheaded.  They state initially her blood pressure was in the 70s systolic but it is improved with her lying down.  She has been mentating normally the entire time.  She denies any drug use or alcohol use but does smoke cigarettes.  She has been compliant with her medications.  No prior history of heart problems or blood clots.  The history is provided by the patient and the EMS personnel.  Weakness      Home Medications Prior to Admission medications    Medication Sig Start Date End Date Taking? Authorizing Provider  amLODipine (NORVASC) 5 MG tablet Take 1 tablet (5 mg total) by mouth daily. Patient not taking: Reported on 08/28/2022 05/14/21   Hetty Blend L, NP-C  atorvastatin (LIPITOR) 20 MG tablet TAKE ONE TABLET BY MOUTH ONCE DAILY FOR CHOLESTEROL 09/10/22   Henson, [name], NP-C  clonazePAM (KLONOPIN) 0.5 MG tablet Take 1 tablet (0.5 mg total) by mouth 2 (two) times daily as needed for anxiety. 11/12/22   Arfeen, Phillips Grout, MD  FLUoxetine (PROZAC) 20 MG capsule Take 1 capsule (20 mg total) by mouth daily. 11/12/22   Arfeen, Phillips Grout, MD  gabapentin (NEURONTIN) 300 MG capsule TAKE 1 CAPSULE (300 MG TOTAL) BY MOUTH AT BEDTIME. 09/10/22   Patel, Roxana Hires K, DO  glycerin adult 2 g suppository Place 1 suppository rectally as needed for constipation. Patient not taking: Reported on 09/03/2022    [provider]  IMODIUM A-D 2 MG tablet Take 2 mg by mouth 4 (four) times daily as needed for diarrhea or loose stools. Patient not taking: Reported on 09/03/2022    [provider]  Multiple Vitamins-Minerals (BARIATRIC MULTIVITAMINS/IRON) CAPS Take 1 capsule by mouth daily. Patient not taking: Reported on 09/03/2022 01/14/22   Hetty Blend L, NP-C  pantoprazole (PROTONIX) 40 MG tablet Take 1 tablet (40 mg total) by mouth 2 (two) times daily before a meal. 01/27/22  Henson, [name], NP-C  QUEtiapine (SEROQUEL) 25 MG tablet Take 1 tablet (25 mg total) by mouth 2 (two) times daily. 11/12/22 02/10/23  Arfeen, Phillips Grout, MD  QUEtiapine (SEROQUEL) 300 MG tablet Take 1 tablet (300 mg total) by mouth at bedtime. 11/12/22 02/10/23  Arfeen, Phillips Grout, MD      Allergies    Lamictal [lamotrigine]    Review of Systems   Review of Systems  Neurological:  Positive for weakness.    Physical Exam Updated Vital Signs BP (!) 102/56   Pulse (!) 104   Temp 97.6 F (36.4 C)   Resp 18   SpO2 93%  Physical Exam Vitals and nursing note reviewed.   Constitutional:      General: She is not in acute distress.    Appearance: She is well-developed.  HENT:     Head: Normocephalic and atraumatic.  Eyes:     Pupils: Pupils are equal, round, and reactive to light.  Cardiovascular:     Rate and Rhythm: Regular rhythm. Tachycardia present.     Pulses: Normal pulses.     Heart sounds: Normal heart sounds. No murmur heard.    No friction rub.  Pulmonary:     Effort: Pulmonary effort is normal.     Breath sounds: Normal breath sounds. No wheezing or rales.  Abdominal:     General: Bowel sounds are normal. There is no distension.     Palpations: Abdomen is soft.     Tenderness: There is no abdominal tenderness. There is no guarding or rebound.  Musculoskeletal:        General: No tenderness. Normal range of motion.     Right lower leg: Edema present.     Left lower leg: Edema present.     Comments: Nonpitting edema present in bilateral lower extremities to above the ankle.  Tenderness with palpation in both legs up to the medial thighs bilaterally  Skin:    General: Skin is warm and dry.     Coloration: Skin is not pale.     Findings: No rash.  Neurological:     Mental Status: She is alert and oriented to person, place, and time. Mental status is at baseline.     Cranial Nerves: No cranial nerve deficit.  Psychiatric:        Mood and Affect: Mood normal.        Behavior: Behavior normal.     ED Results / Procedures / Treatments   Labs (all labs ordered are listed, but only abnormal results are displayed) Labs Reviewed  CBC WITH DIFFERENTIAL/PLATELET  COMPREHENSIVE METABOLIC PANEL  URINALYSIS, W/ REFLEX TO CULTURE (INFECTION SUSPECTED)  MAGNESIUM  D-DIMER, QUANTITATIVE  TROPONIN I (HIGH SENSITIVITY)    EKG None  Radiology No results found.  Procedures Procedures  {Document cardiac monitor, telemetry assessment procedure when appropriate:1}  Medications Ordered in ED Medications - No data to display  ED Course/  Medical Decision Making/ A&P   {   Click here for ABCD2, HEART and other calculatorsREFRESH Note before signing :1}                              Medical Decision Making Amount and/or Complexity of Data Reviewed Labs: ordered. Radiology: ordered.   Pt with multiple medical problems and comorbidities and presenting today with a complaint that caries a high risk for morbidity and mortality.  Here here today with complaints of leg swelling, intermittent  shortness of breath, near syncope.  Noted to be hypotensive with EMS but has responded just to being laid down.  Concern for PE/DVT, CHF, kidney abnormalities, electrolyte abnormalities.  Low suspicion for myocarditis, pericarditis, acute infectious etiology.  Patient is persistently tachycardic here but sats are 93 to 95% on room air.  Blood pressure remains in the low 100s but normal.   {Document critical care time when appropriate:1} {Document review of labs and clinical decision tools ie heart score, Chads2Vasc2 etc:1}  {Document your independent review of radiology images, and any outside records:1} {Document your discussion with family members, caretakers, and with consultants:1} {Document social determinants of health affecting pt's care:1} {Document your decision making why or why not admission, treatments were needed:1} Final Clinical Impression(s) / ED Diagnoses Final diagnoses:  None    Rx / DC Orders ED Discharge Orders     None

## 2023-01-27 NOTE — Patient Outreach (Signed)
  Care Management   Visit Note   Name: [name] MRN: [id number] DOB: 08/05/1965  Subjective: Shannon [name] is a 57 y.o. year Shannon female who is a primary care patient of Suezanne Jacquet, [name], NP-C. The Care Management team was consulted for assistance.      Engaged with Shannon [name] and Shannon [name], Shannon [name] via telephone.   Interventions:   Goals Addressed             This Visit's Progress    Care Management       Current Barriers:  Care Management support related to Mixed Bipolar Disorder, Hypertension and mobility limitations Lacks caregiver support.  Corporate treasurer.  Transportation barriers Housing needs  Planned Interventions: Call received from Shannon [name]. She is currently admitted at [facility] in [place].  She reports doing well with significant improvements to overall mood since being admitted. She endorses several episodes of auditory hallucinations during the first few days of her admission. Reports the hallucinations have subsided. She anticipates being [name] for transition to an assisted living or rehab facility within the next few weeks. Reports speaking with a facility in [place] that may be able to accommodate her. Reports acceptance is pending Medicaid approval. Her ability to complete ADLs remains limited due to mobility. She has collaborated with the assigned SW at [name] regarding options for placement after discharge. Shannon [name] provided contact information for her brother, Shannon [name]. Reports he lives out of state but is agreeable to traveling to [place] to assist. Reports Medicaid approval is pending due to outstanding vehicle registration fees. Reports [name] has agreed resolve the issues regarding her vehicle and fees. The care management team will continue collaborating with Shannon [name] and the assigned facility SW, Shannon [name] regarding plan for transition. Update 01/18/23: Collaboration with assigned facility  SW/Shannon [name]. Shannon [name] reports difficulty with placement for Shannon [name]. Reports challenge may be d/t the nature of Shannon [name] needs. Notes [name] is considered a crisis stabilization facility, and most patients tend to transition to shelters, drug rehab facilities or half-way homes. Shannon [name] does not meet the requirements for the facilities they currently partner with. The team agrees that transition to a shelter would not be ideal d/t significant mobility limitations. Shannon [name] requested to speak with the assigned Population Health SW to discuss the process for placement in a skilled nursing or similar facility.  Reports [name]'s condition is currently stable. The facility is currently keeping her onboard due to homelessness. Will message [facility] LCSW regarding Shannon [name]'s request.          PLAN Will update LCSW and relay message regarding request to collaborate with Shannon [name]/SW, [name] Will follow up within the next week   Katina Degree Health  [facility], [facility] RN Care Manager Direct Dial: [phone] Website: [email or web address]

## 2023-01-27 NOTE — ED Notes (Signed)
Patient made aware a urine sample is needed.  

## 2023-01-27 NOTE — Patient Outreach (Signed)
  Care Management   Outreach Note  01/27/2023 Name: MANINA SCURRY MRN: [id number] DOB: [birth year]   Contacted [facility]. Unable to reach Ms. Leason or assigned SW, Irving Burton.  Left message requesting a return call.    Follow Up Plan:  Pending return call.   Katina Degree Health  [facility], [facility] Health RN Care Manager Direct Dial: [phone] Website: [email or web address]

## 2023-01-28 ENCOUNTER — Other Ambulatory Visit (HOSPITAL_COMMUNITY): Payer: Medicare Other

## 2023-01-28 ENCOUNTER — Observation Stay (HOSPITAL_COMMUNITY): Payer: Medicare Other

## 2023-01-28 ENCOUNTER — Other Ambulatory Visit: Payer: Self-pay

## 2023-01-28 DIAGNOSIS — I10 Essential (primary) hypertension: Secondary | ICD-10-CM | POA: Diagnosis not present

## 2023-01-28 DIAGNOSIS — R2681 Unsteadiness on feet: Secondary | ICD-10-CM | POA: Diagnosis not present

## 2023-01-28 DIAGNOSIS — R609 Edema, unspecified: Secondary | ICD-10-CM | POA: Diagnosis not present

## 2023-01-28 DIAGNOSIS — E876 Hypokalemia: Secondary | ICD-10-CM

## 2023-01-28 DIAGNOSIS — Z59819 Housing instability, housed unspecified: Secondary | ICD-10-CM | POA: Diagnosis not present

## 2023-01-28 DIAGNOSIS — F3160 Bipolar disorder, current episode mixed, unspecified: Secondary | ICD-10-CM

## 2023-01-28 DIAGNOSIS — E8809 Other disorders of plasma-protein metabolism, not elsewhere classified: Secondary | ICD-10-CM | POA: Diagnosis not present

## 2023-01-28 DIAGNOSIS — D649 Anemia, unspecified: Secondary | ICD-10-CM

## 2023-01-28 DIAGNOSIS — R2689 Other abnormalities of gait and mobility: Secondary | ICD-10-CM | POA: Diagnosis not present

## 2023-01-28 DIAGNOSIS — F17210 Nicotine dependence, cigarettes, uncomplicated: Secondary | ICD-10-CM | POA: Diagnosis not present

## 2023-01-28 DIAGNOSIS — M6281 Muscle weakness (generalized): Secondary | ICD-10-CM | POA: Diagnosis not present

## 2023-01-28 DIAGNOSIS — R0602 Shortness of breath: Secondary | ICD-10-CM | POA: Diagnosis not present

## 2023-01-28 DIAGNOSIS — E559 Vitamin D deficiency, unspecified: Secondary | ICD-10-CM | POA: Diagnosis not present

## 2023-01-28 DIAGNOSIS — R600 Localized edema: Secondary | ICD-10-CM | POA: Diagnosis present

## 2023-01-28 DIAGNOSIS — Z79899 Other long term (current) drug therapy: Secondary | ICD-10-CM | POA: Diagnosis not present

## 2023-01-28 DIAGNOSIS — R770 Abnormality of albumin: Secondary | ICD-10-CM | POA: Diagnosis not present

## 2023-01-28 DIAGNOSIS — Z59811 Housing instability, housed, with risk of homelessness: Secondary | ICD-10-CM | POA: Diagnosis not present

## 2023-01-28 DIAGNOSIS — K760 Fatty (change of) liver, not elsewhere classified: Secondary | ICD-10-CM | POA: Diagnosis not present

## 2023-01-28 DIAGNOSIS — G629 Polyneuropathy, unspecified: Secondary | ICD-10-CM | POA: Diagnosis not present

## 2023-01-28 LAB — BRAIN NATRIURETIC PEPTIDE: B Natriuretic Peptide: 50 pg/mL (ref 0.0–100.0)

## 2023-01-28 LAB — CBC
HCT: 29.7 % — ABNORMAL LOW (ref 36.0–46.0)
Hemoglobin: 9.6 g/dL — ABNORMAL LOW (ref 12.0–15.0)
MCH: 26.6 pg (ref 26.0–34.0)
MCHC: 32.3 g/dL (ref 30.0–36.0)
MCV: 82.3 fL (ref 80.0–100.0)
Platelets: 326 10*3/uL (ref 150–400)
RBC: 3.61 MIL/uL — ABNORMAL LOW (ref 3.87–5.11)
RDW: 17.7 % — ABNORMAL HIGH (ref 11.5–15.5)
WBC: 8.4 10*3/uL (ref 4.0–10.5)
nRBC: 0 % (ref 0.0–0.2)

## 2023-01-28 LAB — VITAMIN B12: Vitamin B-12: 316 pg/mL (ref 180–914)

## 2023-01-28 LAB — URINALYSIS, W/ REFLEX TO CULTURE (INFECTION SUSPECTED)
Bilirubin Urine: NEGATIVE
Glucose, UA: NEGATIVE mg/dL
Hgb urine dipstick: NEGATIVE
Ketones, ur: NEGATIVE mg/dL
Leukocytes,Ua: NEGATIVE
Nitrite: NEGATIVE
Protein, ur: NEGATIVE mg/dL
Specific Gravity, Urine: 1.012 (ref 1.005–1.030)
pH: 6 (ref 5.0–8.0)

## 2023-01-28 LAB — RETICULOCYTES
Immature Retic Fract: 12.6 % (ref 2.3–15.9)
RBC.: 3.55 MIL/uL — ABNORMAL LOW (ref 3.87–5.11)
Retic Count, Absolute: 42.2 10*3/uL (ref 19.0–186.0)
Retic Ct Pct: 1.2 % (ref 0.4–3.1)

## 2023-01-28 LAB — POTASSIUM: Potassium: 3 mmol/L — ABNORMAL LOW (ref 3.5–5.1)

## 2023-01-28 LAB — FOLATE: Folate: 6.7 ng/mL (ref >5.9)

## 2023-01-28 LAB — IRON AND TIBC
Iron: 60 ug/dL (ref 28–170)
Saturation Ratios: 26 % (ref 10.4–31.8)
TIBC: 231 ug/dL — ABNORMAL LOW (ref 250–450)
UIBC: 171 ug/dL

## 2023-01-28 LAB — FERRITIN: Ferritin: 37 ng/mL (ref 11–307)

## 2023-01-28 MED ORDER — LACTATED RINGERS IV BOLUS
500.0000 mL | Freq: Once | INTRAVENOUS | Status: AC
Start: 2023-01-28 — End: 2023-01-28
  Administered 2023-01-28: 500 mL via INTRAVENOUS

## 2023-01-28 MED ORDER — PANTOPRAZOLE SODIUM 40 MG PO TBEC
40.0000 mg | DELAYED_RELEASE_TABLET | Freq: Two times a day (BID) | ORAL | Status: DC
  Administered 2023-01-28 – 2023-02-04 (×10): 40 mg via ORAL
  Filled 2023-01-28 (×11): qty 1

## 2023-01-28 MED ORDER — ONDANSETRON HCL 4 MG PO TABS: 4.0000 mg | ORAL_TABLET | Freq: Four times a day (QID) | ORAL | Status: DC | PRN

## 2023-01-28 MED ORDER — OXCARBAZEPINE 300 MG PO TABS
300.0000 mg | ORAL_TABLET | Freq: Two times a day (BID) | ORAL | Status: DC
  Administered 2023-01-28 – 2023-02-04 (×15): 300 mg via ORAL
  Filled 2023-01-28 (×15): qty 1

## 2023-01-28 MED ORDER — ONDANSETRON HCL 4 MG/2ML IJ SOLN: 4.0000 mg | Freq: Four times a day (QID) | INTRAMUSCULAR | Status: DC | PRN

## 2023-01-28 MED ORDER — HYDROXYZINE HCL 25 MG PO TABS
50.0000 mg | ORAL_TABLET | Freq: Every day | ORAL | Status: DC
  Administered 2023-01-28 – 2023-02-03 (×7): 50 mg via ORAL
  Filled 2023-01-28 (×7): qty 2

## 2023-01-28 MED ORDER — ACETAMINOPHEN 650 MG RE SUPP: 650.0000 mg | Freq: Four times a day (QID) | RECTAL | Status: DC | PRN

## 2023-01-28 MED ORDER — QUETIAPINE FUMARATE 100 MG PO TABS
300.0000 mg | ORAL_TABLET | Freq: Every day | ORAL | Status: DC
Start: 2023-01-28 — End: 2023-02-04
  Administered 2023-01-28 – 2023-02-03 (×7): 300 mg via ORAL
  Filled 2023-01-28 (×7): qty 3

## 2023-01-28 MED ORDER — ENOXAPARIN SODIUM 40 MG/0.4ML IJ SOSY
40.0000 mg | PREFILLED_SYRINGE | INTRAMUSCULAR | Status: DC
Start: 2023-01-28 — End: 2023-02-04
  Administered 2023-01-28 – 2023-02-04 (×8): 40 mg via SUBCUTANEOUS
  Filled 2023-01-28 (×8): qty 0.4

## 2023-01-28 MED ORDER — ACETAMINOPHEN 325 MG PO TABS
650.0000 mg | ORAL_TABLET | Freq: Four times a day (QID) | ORAL | Status: DC | PRN
  Administered 2023-02-02 (×2): 650 mg via ORAL
  Filled 2023-01-28 (×3): qty 2

## 2023-01-28 MED ORDER — QUETIAPINE FUMARATE 25 MG PO TABS
25.0000 mg | ORAL_TABLET | Freq: Two times a day (BID) | ORAL | Status: DC
  Administered 2023-01-28 – 2023-02-04 (×15): 25 mg via ORAL
  Filled 2023-01-28 (×15): qty 1

## 2023-01-28 MED ORDER — GABAPENTIN 100 MG PO CAPS
100.0000 mg | ORAL_CAPSULE | Freq: Three times a day (TID) | ORAL | Status: DC
  Administered 2023-01-28 – 2023-01-29 (×3): 100 mg via ORAL
  Filled 2023-01-28 (×3): qty 1

## 2023-01-28 MED ORDER — CLONAZEPAM 0.5 MG PO TABS
0.5000 mg | ORAL_TABLET | Freq: Two times a day (BID) | ORAL | Status: DC | PRN
  Administered 2023-01-28 – 2023-02-04 (×11): 0.5 mg via ORAL
  Filled 2023-01-28 (×11): qty 1

## 2023-01-28 MED ORDER — POTASSIUM CHLORIDE CRYS ER 20 MEQ PO TBCR
40.0000 meq | EXTENDED_RELEASE_TABLET | Freq: Once | ORAL | Status: AC
  Administered 2023-01-28: 40 meq via ORAL
  Filled 2023-01-28: qty 2

## 2023-01-28 MED ORDER — BUSPIRONE HCL 5 MG PO TABS
7.5000 mg | ORAL_TABLET | Freq: Two times a day (BID) | ORAL | Status: DC
  Administered 2023-01-28 – 2023-02-04 (×15): 7.5 mg via ORAL
  Filled 2023-01-28 (×15): qty 2

## 2023-01-28 NOTE — TOC Initial Note (Signed)
Transition of Care [facility]) - Initial/Assessment Note    Patient Details  Name: Shannon [name] MRN: [id number] Date of Birth: [birth year]  Transition of Care [facility]) CM/SW Contact:    Lanier Clam, RN Phone Number: 01/28/2023, 4:11 PM  Clinical Narrative:  spoke to patient about d/c plans-PT recc [facility] SN[facility]-patient agrees to fax out for [facility] SNF;psych inpt stay 11/8-12/3/24 [facility].initiated passr-await signatures to continue t process for pasrr.                 Expected Discharge Plan: Skilled Nursing Facility Barriers to Discharge: Continued Medical Work up   Patient Goals and CMS Choice Patient states their goals for this hospitalization and ongoing recovery are:: Rehab CMS Medicare.gov Compare Post Acute Care list provided to:: Patient Choice offered to / list presented to : Patient [place] ownership interest in [facility].provided to:: Patient    Expected Discharge Plan and Services   Discharge Planning Services: CM Consult Post Acute Care Choice: Skilled Nursing Facility Living arrangements for the past 2 months: Hotel/Motel                                      Prior Living Arrangements/Services Living arrangements for the past 2 months: Hotel/Motel Lives with:: Self              Current home services: DME (rw,w/c)    Activities of Daily Living   ADL Screening (condition at time of admission) Independently performs ADLs?: Yes (appropriate for developmental age) Is the patient deaf or have difficulty hearing?: No Does the patient have difficulty seeing, even when wearing glasses/contacts?: No Does the patient have difficulty concentrating, remembering, or making decisions?: No  Permission Sought/Granted                  Emotional Assessment              Admission diagnosis:  Hypokalemia [E87.6] Lower extremity edema [R60.0] Peripheral edema [R60.0] Weakness [R53.1] Patient Active Problem List   Diagnosis Date Noted    Housing insecurity 01/28/2023   Peripheral edema 01/28/2023   Hypoalbuminemia 01/28/2023   Anemia 01/28/2023   Bipolar disorder, current episode depressed, severe, with psychotic features (HCC) 01/02/2023   Lack of housing 01/01/2023   At high risk for falls 01/01/2023   Other chronic pain 01/01/2023   Suicidal ideations 01/01/2023   C. difficile colitis 08/30/2022   Hemorrhoid 08/29/2022   Bilateral leg weakness 08/28/2022   Hypocalcemia 08/28/2022   Fall at home, initial encounter 08/28/2022   Diarrhea 08/28/2022   Vitamin B12 deficiency 01/14/2022   Poor diet 01/14/2022   Folate deficiency 01/14/2022   Assistance needed with transportation 01/14/2022   Prediabetes 01/14/2022   Chronic abdominal pain 12/31/2021   Hiatal hernia 12/31/2021   Blurred vision, bilateral 12/25/2021   Pins and needles sensation 12/25/2021   Dizziness 12/25/2021   Upper abdominal pain 12/25/2021   Unable to ambulate 12/25/2021   Osteopenia determined by x-ray 06/13/2021   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   Left hip pain 06/13/2021   Numbness and tingling of both lower extremities 06/10/2021   Laryngopharyngeal reflux 08/16/2020   Dysphagia 08/13/2020   Thrush 08/13/2020   Hoarseness 08/13/2020   Tachycardia 03/09/2020   Rash 03/09/2020   Neck mass 03/09/2020   Pain in toes of both feet 03/09/2020   Piriformis syndrome of both sides 09/02/2018  Routine general medical examination at a health care facility 12/22/2016   Sleep apnea 08/12/2016   Bilateral lower extremity edema 06/12/2015   Colon cancer screening 01/24/2015   GERD (gastroesophageal reflux disease) 12/26/2014   GAD (generalized anxiety disorder) 12/28/2013   Severe headache 12/20/2013   Abnormal vaginal Pap smear 10/25/2013   Thiamin deficiency 10/30/2012   Vitamin D deficiency 10/27/2012   Other abnormal glucose 10/26/2012   Mixed bipolar I disorder (HCC) 07/12/2012   Hypokalemia 09/01/2011   Insomnia  04/04/2011   Visit for screening mammogram 02/26/2011   Hx of laparoscopic gastric banding 09/03/2010   Tobacco abuse 06/09/2010   Hyperlipidemia with target LDL less than 130 01/25/2009   Morbid obesity (HCC) 01/25/2009   Iron deficiency anemia 01/25/2009   Essential hypertension 01/25/2009   Allergic rhinitis 01/25/2009   Degenerative disc disease, lumbar 01/25/2009   Backache 01/25/2009   Leiomyoma of uterus, unspecified 08/13/2008   Asthma 08/13/2008   PCP:  Avanell Shackleton, NP-C Pharmacy:   [facility] - [place], [place] - [address] [place] [address] Phone: [phone] Fax: [phone]  [id number] [place], [place] - [address] AT [facility] OF [facility] & [address] [place] [address] Phone: [phone] Fax: [phone]     Social Determinants of Health (SDOH) Social History: SDOH Screenings   Food Insecurity: Food Insecurity Present (01/01/2023)  Housing: Patient Declined (01/01/2023)  Transportation Needs: Patient Declined (01/01/2023)  Utilities: Not At Risk (08/29/2022)  Alcohol Screen: Low Risk  (10/07/2021)  Depression (PHQ2-9): High Risk (01/01/2023)  Financial Resource Strain: High Risk (01/01/2023)  Physical Activity: Inactive (01/01/2023)  Social Connections: Socially Isolated (01/01/2023)  Stress: Stress Concern Present (01/01/2023)  Tobacco Use: High Risk (01/27/2023)   SDOH Interventions:     Readmission Risk Interventions     No data to display

## 2023-01-28 NOTE — Assessment & Plan Note (Signed)
Replace K+

## 2023-01-28 NOTE — Care Management Obs Status (Signed)
MEDICARE OBSERVATION STATUS NOTIFICATION   Patient Details  Name: MOSES CARLAND MRN: [id number] Date of Birth: [birth year]   Medicare Observation Status Notification Given:  Yes    MahabirOlegario Messier, RN 01/28/2023, 3:25 PM

## 2023-01-28 NOTE — ED Provider Notes (Signed)
Care assumed from Dr. Anitra Lauth. Patient here with leg swelling, difficulty walking, hypotension, hypokalemia.   Requiring much assistance to ambulate. Unable to ambulate independently.  Potassium improving to 3.0. Did not improve with lasix from psych  Admission d/w Dr. Julian Reil for further potassium replacement and workup of hypoalbuminemia.    Glynn Octave, MD 01/28/23 [phone]

## 2023-01-28 NOTE — Assessment & Plan Note (Addendum)
Mild anemia today, looks like this was present in July, but not on ED visit 11/8. No stigmata / report of GIB. Check repeat CBC this AM Check anemia pnl

## 2023-01-28 NOTE — [place] FL2 (Addendum)
[place] MEDICAID FL2 LEVEL OF CARE FORM     IDENTIFICATION  Patient Name: Shannon [name] Birthdate: March 19, 1965 Sex: female Admission Date (Current Location): 01/27/2023  [facility] and [place] Number:  Producer, television/film/video and Address:  [facility],  [address] [place], [place] [address]      Provider Number: [id number]  Attending Physician Name and Address:  Tyrone Nine, MD  Relative Name and Phone Number:  Vella Raring (Friend) [phone]    Current Level of Care: Hospital Recommended Level of Care: Skilled Nursing Facility Prior Approval Number:    Date Approved/Denied:   PASRR Number:   [id number] Discharge Plan: SNF    Current Diagnoses: Patient Active Problem List   Diagnosis Date Noted   Housing insecurity 01/28/2023   Peripheral edema 01/28/2023   Hypoalbuminemia 01/28/2023   Anemia 01/28/2023   Bipolar disorder, current episode depressed, severe, with psychotic features (HCC) 01/02/2023   Lack of housing 01/01/2023   At high risk for falls 01/01/2023   Other chronic pain 01/01/2023   Suicidal ideations 01/01/2023   C. difficile colitis 08/30/2022   Hemorrhoid 08/29/2022   Bilateral leg weakness 08/28/2022   Hypocalcemia 08/28/2022   Fall at home, initial encounter 08/28/2022   Diarrhea 08/28/2022   Vitamin B12 deficiency 01/14/2022   Poor diet 01/14/2022   Folate deficiency 01/14/2022   Assistance needed with transportation 01/14/2022   Prediabetes 01/14/2022   Chronic abdominal pain 12/31/2021   Hiatal hernia 12/31/2021   Blurred vision, bilateral 12/25/2021   Pins and needles sensation 12/25/2021   Dizziness 12/25/2021   Upper abdominal pain 12/25/2021   Unable to ambulate 12/25/2021   Osteopenia determined by x-ray 06/13/2021   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   Left hip pain 06/13/2021   Numbness and tingling of both lower extremities 06/10/2021   Laryngopharyngeal reflux 08/16/2020   Dysphagia 08/13/2020    Thrush 08/13/2020   Hoarseness 08/13/2020   Tachycardia 03/09/2020   Rash 03/09/2020   Neck mass 03/09/2020   Pain in toes of both feet 03/09/2020   Piriformis syndrome of both sides 09/02/2018   Routine general medical examination at a health care facility 12/22/2016   Sleep apnea 08/12/2016   Bilateral lower extremity edema 06/12/2015   Colon cancer screening 01/24/2015   GERD (gastroesophageal reflux disease) 12/26/2014   GAD (generalized anxiety disorder) 12/28/2013   Severe headache 12/20/2013   Abnormal vaginal Pap smear 10/25/2013   Thiamin deficiency 10/30/2012   Vitamin D deficiency 10/27/2012   Other abnormal glucose 10/26/2012   Mixed bipolar I disorder (HCC) 07/12/2012   Hypokalemia 09/01/2011   Insomnia 04/04/2011   Visit for screening mammogram 02/26/2011   Hx of laparoscopic gastric banding 09/03/2010   Tobacco abuse 06/09/2010   Hyperlipidemia with target LDL less than 130 01/25/2009   Morbid obesity (HCC) 01/25/2009   Iron deficiency anemia 01/25/2009   Essential hypertension 01/25/2009   Allergic rhinitis 01/25/2009   Degenerative disc disease, lumbar 01/25/2009   Backache 01/25/2009   Leiomyoma of uterus, unspecified 08/13/2008   Asthma 08/13/2008    Orientation RESPIRATION BLADDER Height & Weight     Self, Time, Situation, Place  Normal Continent Weight: 103.4 kg Height:  5\' 4"  (162.6 cm)  BEHAVIORAL SYMPTOMS/MOOD NEUROLOGICAL BOWEL NUTRITION STATUS      Continent Diet (Regular)  AMBULATORY STATUS COMMUNICATION OF NEEDS Skin   Limited Assist Verbally Normal  Personal Care Assistance Level of Assistance  Bathing, Feeding, Dressing Bathing Assistance: Limited assistance Feeding assistance: Limited assistance Dressing Assistance: Limited assistance     Functional Limitations Info  Sight, Hearing, Speech Sight Info: Impaired (eyeglasses) Hearing Info: Adequate Speech Info: Adequate    SPECIAL CARE FACTORS  FREQUENCY  PT (By licensed PT), OT (By licensed OT)     PT Frequency: 5x week OT Frequency: 5x week            Contractures Contractures Info: Not present    Additional Factors Info  Code Status, Allergies, Psychotropic Code Status Info: Full Allergies Info: Pork products;Lamictal Psychotropic Info: buspar,klonopin, seroquel         Current Medications (01/28/2023):  This is the current hospital active medication list Current Facility-Administered Medications  Medication Dose Route Frequency Provider Last Rate Last Admin   acetaminophen (TYLENOL) tablet 650 mg  650 mg Oral Q6H PRN Hillary Bow, DO       Or   acetaminophen (TYLENOL) suppository 650 mg  650 mg Rectal Q6H PRN Hillary Bow, DO       busPIRone (BUSPAR) tablet 7.5 mg  7.5 mg Oral BID Hazeline Junker B, MD   7.5 mg at 01/28/23 1424   clonazePAM (KLONOPIN) tablet 0.5 mg  0.5 mg Oral BID PRN Tyrone Nine, MD       enoxaparin (LOVENOX) injection 40 mg  40 mg Subcutaneous Q24H Lyda Perone M, DO   40 mg at 01/28/23 0940   gabapentin (NEURONTIN) capsule 100 mg  100 mg Oral TID Tyrone Nine, MD   100 mg at 01/28/23 1424   hydrOXYzine (ATARAX) tablet 50 mg  50 mg Oral QHS Tyrone Nine, MD       ondansetron [facility]) tablet 4 mg  4 mg Oral Q6H PRN Hillary Bow, DO       Or   ondansetron [facility]) injection 4 mg  4 mg Intravenous Q6H PRN Hillary Bow, DO       Oxcarbazepine (TRILEPTAL) tablet 300 mg  300 mg Oral BID Tyrone Nine, MD       pantoprazole (PROTONIX) EC tablet 40 mg  40 mg Oral BID AC Tyrone Nine, MD       QUEtiapine (SEROQUEL) tablet 25 mg  25 mg Oral BID Tyrone Nine, MD   25 mg at 01/28/23 1424   QUEtiapine (SEROQUEL) tablet 300 mg  300 mg Oral QHS Tyrone Nine, MD         Discharge Medications: Please see discharge summary for a list of discharge medications.  Relevant Imaging Results:  Relevant Lab Results:   Additional Information SS#[id number]  Rondall Radigan, Olegario Messier, [place]

## 2023-01-28 NOTE — Assessment & Plan Note (Addendum)
Unclear cause but likely contributing to peripheral edema. nl kidney function and no protein in urine. Fatty liver on CT scan in July but no h/o cirrhosis  Check RUQ [name] with elastography to see if her fatty liver may in-fact be subclinical cirrhosis. Keeping pt NPO for this test Dietary consult to see if malnutrition playing a role

## 2023-01-28 NOTE — ED Notes (Signed)
Pt unsteady on feet transferring to wheelchair and toilet. Required assistance.

## 2023-01-28 NOTE — Plan of Care (Signed)

## 2023-01-28 NOTE — Assessment & Plan Note (Addendum)
Re-order psych meds as soon as med rec completed.

## 2023-01-28 NOTE — H&P (Addendum)
History and Physical    Patient: Shannon [name] NWG:[id number] DOB: Oct 25, 1965 DOA: 01/27/2023 DOS: the patient was seen and examined on 01/28/2023 PCP: Avanell Shackleton, NP-C  Patient coming from: Homeless / Hotel  Chief Complaint:  Chief Complaint  Patient presents with   Weakness   HPI: Shannon [name] is a 57 y.o. female with medical history significant of CPS / peripheral neuropathy, BPD 1, HTN, morbid obesity.  Pt recently admitted to psych, [name], for 16 days after she developed SI on 11/8 related to housing insecurity (see PCP note on 11/8 for details).  Patient reports for the last 16 days she has had worsening swelling of bilateral legs that has progressed to involve her thighs also with pain.  This has onset and progressed despite lasix being started while she was at psych inpatient.  Has had some intermittent SOB but no cough, no fever.  Occasional N/V, no abd pain.  Today she went down to the drugstore to get some compression socks to put on her legs. She did get them on but when she was trying to get them off she was having a hard time getting it off one of her legs. She also reports taking her evening time medications which made her feel very sleepy. However when she was unable to get the other compression sock off and feeling unwell she called 911. She reports by the time she got to the door to open to let EMS in she felt very lightheaded. They state initially her blood pressure was in the 70s systolic but it is improved with her lying down. She has been mentating normally the entire time. She denies any drug use or alcohol use but does smoke cigarettes. She has been compliant with her medications. No prior history of heart problems or blood clots.  In ED: Had hypotension episode that improved with 500cc IVF bolus.  Has hypokalemia with K 2.6  Remainder of work up largely unremarkable.   Review of Systems: As mentioned in the history of present illness. All other  systems reviewed and are negative. Past Medical History:  Diagnosis Date   Allergic rhinitis    Allergic rhinitis 01/25/2009        ANEMIA-IRON DEFICIENCY 01/25/2009   Anxiety    Backache 01/25/2009   Qualifier: Diagnosis of  By: Jonny Ruiz MD, Len Blalock    Bipolar 1 disorder [facility])    Chronic pain syndrome    Complete rotator cuff tear 07/21/2013   Complete tear of right rotator cuff 07/21/2013   Contact lens/glasses fitting    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   DISC DISEASE, LUMBAR 01/25/2009   Essential hypertension 01/25/2009   On Bystolic    GAD (generalized anxiety disorder) 12/28/2013   GERD (gastroesophageal reflux disease)    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   Headache, acute 12/20/2013   Hx of laparoscopic gastric banding 09/03/2010   Surgery date: 07/17/10    HYPERLIPIDEMIA 01/25/2009   Hyperlipidemia with target LDL less than 130 01/25/2009   HYPERTENSION 01/25/2009   Hypokalemia, inadequate intake 09/01/2011   Insomnia 04/04/2011   INSOMNIA-SLEEP DISORDER-UNSPEC 04/30/2009   Qualifier: Diagnosis of  By: Jonny Ruiz MD, Len Blalock    Iron deficiency anemia 01/25/2009        Labyrinthitis 02/19/2014   Leiomyoma of uterus, unspecified 08/13/2008   Overview:  Leiomyoma Of The Uterus  10/1 IMO update   Localized edema 06/12/2015   MANIC DEPRESSIVE ILLNESS 01/25/2009   pt is unsue if this  is her specifc dx   Mixed bipolar I disorder (HCC) 07/12/2012   Morbid obesity (HCC) 01/25/2009   Night sweats    Osteopenia determined by x-ray 06/13/2021   Other abnormal glucose 10/26/2012   Palpitations 10/25/2013   PEPTIC ULCER DISEASE, HELICOBACTER PYLORI POSITIVE 10/03/2009   Peripheral edema 06/25/2015   Pernicious anemia 03/28/2012   Piriformis syndrome of both sides 09/02/2018   PUD (peptic ulcer disease) 10/03/2009        Right shoulder pain 05/03/2013   Dg Shoulder Right  05/03/2013   CLINICAL DATA Pain.  EXAM RIGHT SHOULDER - 2+ VIEW  COMPARISON None.  FINDINGS Acromioclavicular and glenohumeral  degenerative change present. Questionable calcific density noted in the region of the supraspinatus space, possibly representing calcific supraspinatus tendinitis. This could represent a sclerotic density in the acromion. MRI of the right shoulder suggest   Sleep apnea 08/12/2016   SMOKER 01/25/2009   Qualifier: Diagnosis of  By: Jonny Ruiz MD, Len Blalock    Subacromial bursitis 05/17/2013   SUBSTANCE ABUSE 11/06/2009        Thiamin deficiency 10/30/2012   Tobacco abuse 06/09/2010   Visual disturbance 05/03/2013   Vitamin D deficiency 10/27/2012   Past Surgical History:  Procedure Laterality Date   ABDOMINAL HYSTERECTOMY     BLADDER SURGERY     s/p with ?diverticulitis   HAND TENDON SURGERY  1991   s/p-Right-index and middle   LAPAROSCOPIC GASTRIC BAND REMOVAL WITH LAPAROSCOPIC GASTRIC SLEEVE RESECTION N/A 08/17/2016   Procedure: LAPAROSCOPIC GASTRIC BAND REMOVAL WITH LAPAROSCOPIC GASTRIC SLEEVE RESECTION WITH UPPER ENDO;  Surgeon: Ovidio Kin, MD;  Location: [facility];  Service: General;  Laterality: N/A;   LAPAROSCOPIC GASTRIC BANDING  07/14/10   SHOULDER ARTHROSCOPY Right 07/21/2013   Procedure: RIGHT ARTHROSCOPY SHOULDER DEBRIDMENT EXTENTSIVE,ARTHROSCOPIC REMOVE LOOSE FOREIGN BODY, BICEPS TENOLYSIS ;  Surgeon: Eulas Post, MD;  Location: [facility];  Service: Orthopedics;  Laterality: Right;   TUBAL LIGATION     Social History:  reports that she has been smoking cigarettes. She has a 5 pack-year smoking history. She has never used smokeless tobacco. She reports that she does not drink alcohol and does not use drugs.  Allergies  Allergen Reactions   Lamictal [Lamotrigine] Rash    Family History  Problem Relation Age of Onset   Hypertension Mother    Stroke Father    Cirrhosis Father        ETOH   Hypertension Father    Diabetes Father    Alcohol abuse Father    Hypertension Sister    Asthma Sister    Hypertension Brother    Hypertension Paternal Aunt    Diabetes Maternal  Grandmother    ADD / ADHD Son    ADD / ADHD Other        3 nephews, also emotional issues   Diabetes Other        Uncle   Diabetes Other        Aunt   Suicidality Neg Hx     Prior to Admission medications   Medication Sig Start Date End Date Taking? Authorizing Provider  amLODipine (NORVASC) 5 MG tablet Take 1 tablet (5 mg total) by mouth daily. Patient not taking: Reported on 08/28/2022 05/14/21   Hetty Blend L, NP-C  atorvastatin (LIPITOR) 20 MG tablet TAKE ONE TABLET BY MOUTH ONCE DAILY FOR CHOLESTEROL 09/10/22   Henson, [name], NP-C  clonazePAM (KLONOPIN) 0.5 MG tablet Take 1 tablet (0.5 mg total) by mouth 2 (  two) times daily as needed for anxiety. 11/12/22   Arfeen, Phillips Grout, MD  FLUoxetine (PROZAC) 20 MG capsule Take 1 capsule (20 mg total) by mouth daily. 11/12/22   Arfeen, Phillips Grout, MD  gabapentin (NEURONTIN) 300 MG capsule TAKE 1 CAPSULE (300 MG TOTAL) BY MOUTH AT BEDTIME. 09/10/22   Patel, Roxana Hires K, DO  glycerin adult 2 g suppository Place 1 suppository rectally as needed for constipation. Patient not taking: Reported on 09/03/2022    [provider]  IMODIUM A-D 2 MG tablet Take 2 mg by mouth 4 (four) times daily as needed for diarrhea or loose stools. Patient not taking: Reported on 09/03/2022    [provider]  Multiple Vitamins-Minerals (BARIATRIC MULTIVITAMINS/IRON) CAPS Take 1 capsule by mouth daily. Patient not taking: Reported on 09/03/2022 01/14/22   Hetty Blend L, NP-C  pantoprazole (PROTONIX) 40 MG tablet Take 1 tablet (40 mg total) by mouth 2 (two) times daily before a meal. 01/27/22   Henson, [name], NP-C  QUEtiapine (SEROQUEL) 25 MG tablet Take 1 tablet (25 mg total) by mouth 2 (two) times daily. 11/12/22 02/10/23  Arfeen, Phillips Grout, MD  QUEtiapine (SEROQUEL) 300 MG tablet Take 1 tablet (300 mg total) by mouth at bedtime. 11/12/22 02/10/23  Cleotis Nipper, MD    Physical Exam: Vitals:   01/28/23 0400 01/28/23 0415 01/28/23 0430 01/28/23 0512  BP: (!)  77/66 (!) 69/58 111/69 121/78  Pulse:      Resp: 18 15    Temp:      SpO2:       Constitutional: NAD, calm, comfortable Respiratory: clear to auscultation bilaterally, no wheezing, no crackles. Normal respiratory effort. No accessory muscle use.  Cardiovascular: Regular rate and rhythm, no murmurs / rubs / gallops. 1-2+ BLE edema 2+ pedal pulses. No carotid bruits.  Abdomen: no tenderness, no masses palpated. No hepatosplenomegaly. Bowel sounds positive.  Neurologic: CN 2-12 grossly intact. Sensation intact, DTR normal. Strength 5/5 in all 4.  Psychiatric: Normal judgment and insight. Alert and oriented x 3. Normal mood.   Data Reviewed:    Labs on Admission: I have personally reviewed following labs and imaging studies  CBC: Recent Labs  Lab 01/27/23 2231  WBC 9.5  NEUTROABS 6.3  HGB 9.6*  HCT 29.7*  MCV 82.3  PLT 332   Basic Metabolic Panel: Recent Labs  Lab 01/27/23 2231 01/28/23 0240  NA 141  --   K 2.6* 3.0*  CL 104  --   CO2 29  --   GLUCOSE 112*  --   BUN 5*  --   CREATININE 0.85  --   CALCIUM 8.3*  --   MG 2.0  --    GFR: CrCl cannot be calculated (Unknown ideal weight.). Liver Function Tests: Recent Labs  Lab 01/27/23 2231  AST 13*  ALT 9  ALKPHOS 75  BILITOT 0.5  PROT 6.5  ALBUMIN 2.5*   No results for input(s): "LIPASE", "AMYLASE" in the last 168 hours. No results for input(s): "AMMONIA" in the last 168 hours. Coagulation Profile: No results for input(s): "INR", "PROTIME" in the last 168 hours. Cardiac Enzymes: No results for input(s): "CKTOTAL", "CKMB", "CKMBINDEX", "TROPONINI" in the last 168 hours. BNP (last 3 results) No results for input(s): "PROBNP" in the last 8760 hours. HbA1C: No results for input(s): "HGBA1C" in the last 72 hours. CBG: No results for input(s): "GLUCAP" in the last 168 hours. Lipid Profile: No results for input(s): "CHOL", "HDL", "LDLCALC", "TRIG", "CHOLHDL", "LDLDIRECT" in the  last 72 hours. Thyroid Function  Tests: No results for input(s): "TSH", "T4TOTAL", "FREET4", "T3FREE", "THYROIDAB" in the last 72 hours. Anemia Panel: No results for input(s): "VITAMINB12", "FOLATE", "FERRITIN", "TIBC", "IRON", "RETICCTPCT" in the last 72 hours. Urine analysis:    Component Value Date/Time   COLORURINE YELLOW 01/28/2023 0020   APPEARANCEUR CLEAR 01/28/2023 0020   LABSPEC 1.012 01/28/2023 0020   PHURINE 6.0 01/28/2023 0020   GLUCOSEU NEGATIVE 01/28/2023 0020   GLUCOSEU NEGATIVE 06/12/2015 1209   HGBUR NEGATIVE 01/28/2023 0020   BILIRUBINUR NEGATIVE 01/28/2023 0020   KETONESUR NEGATIVE 01/28/2023 0020   PROTEINUR NEGATIVE 01/28/2023 0020   UROBILINOGEN 0.2 06/12/2015 1209   NITRITE NEGATIVE 01/28/2023 0020   LEUKOCYTESUR NEGATIVE 01/28/2023 0020    Radiological Exams on Admission: DG Chest Port 1 View  Result Date: 01/27/2023 CLINICAL DATA:  Shortness of breath.  Weakness. EXAM: PORTABLE CHEST 1 VIEW COMPARISON:  01/14/2022 FINDINGS: Shallow inspiration. Heart size and pulmonary vascularity are normal for technique. Lungs are clear. No pleural effusions. No pneumothorax. Mediastinal contours appear intact. IMPRESSION: No active disease. Electronically Signed   By: Burman Nieves M.D.   On: 01/27/2023 22:33    EKG: Independently reviewed.   Assessment and Plan: * Peripheral edema Pt with peripheral edema / generalized weakness. No pulmonary edema. Much of her edema is non-pitting on exam, however does seem to have some pitting component (especially where her socks have compressed her ankles). DDx of pitting edema includes R sided CHF, hepatogenic hypoalbuminemia, nephrogenic hypoalbuminemia, venous stasis, among other causes. Checking BNP and 2d echo to look for evidence of R sided CHF Seems to be not improved despite lasix for past 16 days while inpatient at psych Think the lasix actually just made her dehydrated / hypotensive + hypokalemic Working up hypoalbuminemia as below PT/OT for  generalized weakness SW consult as well Venous stasis considered (looks like Dr. Karin Lieu ordered Vasc US venous reflux that was never completed back in Oct 2023), but looks like an earlier [name] looking for this back in 2017 was negative at that time. Based on these studies being ordered, it seems her Edema issues may be a bit more long term than just the past 16 days  Anemia Mild anemia today, looks like this was present in July, but not on ED visit 11/8. No stigmata / report of GIB. Check repeat CBC this AM Check anemia pnl  Hypoalbuminemia Unclear cause but likely contributing to peripheral edema. nl kidney function and no protein in urine. Fatty liver on CT scan in July but no h/o cirrhosis  Check RUQ [name] with elastography to see if her fatty liver may in-fact be subclinical cirrhosis. Keeping pt NPO for this test Dietary consult to see if malnutrition playing a role  Housing insecurity Suspect this may be playing a major role in her presentations to hospitals last month and again today: see PCPs note from 01/01/23 just prior to ED presentation and psych admission on same day. SW consult (looks like SW has been following her case for quite some time now).  Hypokalemia Replace K  Mixed bipolar I disorder (HCC) Re-order psych meds as soon as med rec completed.  Essential hypertension HYPOtensive intermittently today, holding home BP meds for the moment.      Advance Care Planning:   Code Status: Full Code  Consults: None  Family Communication: No family in room  Severity of Illness: The appropriate patient status for this patient is OBSERVATION. Observation status is judged to be reasonable and  necessary in order to provide the required intensity of service to ensure the patient's safety. The patient's presenting symptoms, physical exam findings, and initial radiographic and laboratory data in the context of their medical condition is felt to place them at decreased risk for  further clinical deterioration. Furthermore, it is anticipated that the patient will be medically stable for discharge from the hospital within 2 midnights of admission.   Author: Hillary Bow., DO 01/28/2023 5:47 AM  For on call review [email or web address].

## 2023-01-28 NOTE — Progress Notes (Signed)
[facility] PROGRESS NOTE  Shannon [name] (DOB: 07-05-1965) ION:[id number] PCP: Avanell Shackleton, NP-C  Brief Narrative: Shannon [name] is a 57 y.o. female with a history of CPS, peripheral neuropathy, bipolar 1 disorder, HTN, morbid obesity s/p sleeve gastrectomy who presented to the ED on 01/27/2023 with weakness and dependent edema that did not improve with lasix. Work up findings included hypokalemia (K 2.6), hypoalbuminemia (2.5) with otherwise nonelevated LFTs. Tropon (3) and BNP (50) were normal. Hgb 9.6g/dl. D-dimer negative at 0.44. Urinalysis negative, including no dipstick protein. She was admitted due to the weakness this morning by Dr. Julian Reil.  Subjective: Went to [place] for improved cost of living, had problems with their meat and ended up only eating snacks and had to come back to [name] when she ran out of medications, was admitted at [facility] and stabilized on her medications. 1-2 days after discharge she had purchased compression socks at the pharmacy which she was unable to remove so she called 911. Due to her neuropathy, on a "good day" she gets around with a walker and needs assistance with stairs. On a bad day she is wheelchair bound. Leg swelling somewhat improves when legs are elevated.   Objective: BP (!) 111/43 (BP Location: Right Arm)   Pulse 98   Temp 98.6 F (37 C) (Oral)   Resp 19   Ht 5\' 4"  (1.626 m)   Wt 103.4 kg   SpO2 98%   BMI 39.14 kg/m   No distress, trace pitting edema in legs. No visible or palpable abnormality over sites of reported severe pain to light touch.   U/S: Echogenic liver parenchyma with subtle nodularity of liver contour raising the question of cirrhosis. Median kPa is 3.8 (high probability of being normal)  Assessment & Plan: Edema: Multifactorial, though certainly has primary venous insufficiency given improvement with elevation, likely worsening with morbid obesity. Hypoalbuminemia definitely contributing, though none in  urine, and only mild abnormality on liver U/S. D-dimer negative.  - Echocardiogram still pending. No plan to restart diuretic unless this is abnormal. - Plan to supplement dietary protein - Elevated legs, apply compression stockings - Chronicity of problem (has had multiple work ups for edema dating to 2017) noted.  - Hold amlodipine as this may be contributing as well.   Weakness:  - PT recommending SNF, will consult TOC.   Hepatic steatosis: Elastography does not suggest compensated advanced chronic liver disease. Echogenicity with subtle nodularity may suggest early cirrhotic changes.  - Primary treatment will be glycemic control (last HbA1c was 6.1% in 2023, will recheck) and weight loss.  - Check PT/INR in AM  Morbid obesity: s/p lap band which was removed and sleeve gastrectomy performed. This is contributing to protein calorie deficiency.  - RD consult, will supplement/emphasize protein.   Hypokalemia:  - Repeat supplementation and monitor in AM.  Normocytic anemia: Anemia panel without deficiency. Hgb stable at 9.6g/dl. Could be contributing to edema, though not to a significant extent.   Neuropathy:  - Reorder gabapentin  Bipolar disorder:  - Reorder home medications.    Shannon Nine, MD [facility] [email or web address] 01/28/2023, 3:24 PM

## 2023-01-28 NOTE — ED Notes (Signed)
Pt has a difficulty standing at the moment. The RN ambulated pt to the bathroom and had to get a wheelchair to help get the pt to and from with struggle.

## 2023-01-28 NOTE — Assessment & Plan Note (Addendum)
HYPOtensive intermittently today, holding home BP meds for the moment.

## 2023-01-28 NOTE — Assessment & Plan Note (Addendum)
Pt with peripheral edema / generalized weakness. No pulmonary edema. Much of her edema is non-pitting on exam, however does seem to have some pitting component (especially where her socks have compressed her ankles). DDx of pitting edema includes R sided CHF, hepatogenic hypoalbuminemia, nephrogenic hypoalbuminemia, venous stasis, among other causes. Checking BNP and 2d echo to look for evidence of R sided CHF Seems to be not improved despite lasix for past 16 days while inpatient at psych Think the lasix actually just made her dehydrated / hypotensive + hypokalemic Working up hypoalbuminemia as below PT/OT for generalized weakness SW consult as well Venous stasis considered (looks like Dr. Karin Lieu ordered Vasc US venous reflux that was never completed back in Oct 2023), but looks like an earlier [name] looking for this back in 2017 was negative at that time. Based on these studies being ordered, it seems her Edema issues may be a bit more long term than just the past 16 days

## 2023-01-28 NOTE — ED Notes (Addendum)
Pt very rude to the pharmacy tech attempting to verify her medication list, pt states to this nurse "I am getting very irritated because he is asking me questions he already knows the answer too." Attempt made to educate pt that verifying a medication list is very important any time a pt enters the hospital for treatment. Pt then calling out for assistance because of her IV pump beeping and that she dropped her phone, while nurse attempting to reset the beeping IV pump which is occluded d/t pt arm completely bent occluding her IV at this time, inquired if pt would please straighten her arm to fix her IV. Pt talking over this nurse and very boisterous states that "I need you to take my phone out there right now and charge my phone! I have no family here and you not taking my phone out to charge it is triggering me!" Pt informed that her critically low blood pressure is of higher concern to this nurse at the moment, and that we will take care of her phone getting charged once I can get her blood pressure issues addressed with the physician.  Pt screaming at this nurse "who do you think you are you fat ass? You yelling at me and who do you think you fucking are?!"  Pt is most concerned that her "iphone has 1 bar left of battery" and yelling at this nurse demanding it be taken to put on a charger. Pt informed that this nurse does not have a charger at this time for her phone, and once blood pressure is improved that this nurse will attempt to find a charger for her phone. Pt continues to yell at this nurse insisting that "who the fuck do you think you are?!" Pt informed that once she can speak in a level tone and not curse and scream at nursing staff that additional support outside of emergent medical needs can attempt to be addressed. [employer] off-duty outside of room and witnessing pt behavior d/t her yelling at this nurse to assess if assistance is needed at this time. TJ, Charge Nurse requested to talk with pt as pt  continues to scream through her call bell to staff answering it.

## 2023-01-28 NOTE — Evaluation (Signed)
Physical Therapy Evaluation Patient Details Name: Shannon [name] MRN: [id number] DOB: 1966-01-20 Today's Date: 01/28/2023  History of Present Illness  Patient is a 57 year old female who is presenting due to feeling generally unwell and lightheaded and BLE swelling.  PMH: anemia, morbid obesity, chronic pain, bipolar disease, electrolyte disturbances, recent psychiatric admission 11/8-12/3  Clinical Impression  Pt admitted with above diagnosis. Pt reports ind with rollator walker, needing seated rest breaks to complete self care tasks and short distance ambulation. Pt reports current homelessness, does have car, no further details provided. On eval, pt comes to sitting EOB with min A to scoot out to EOB, able to upright trunk into sitting and swing BLE to EOB with increased time and effort. Pt needing max A to power up from elevated bed, needing multiple attempts to fully power up, able to take ~2 sidesteps up to [facility] with bed behind pt. Pt reporting 8/10 pain, worst in bottoms of both feet, but also inner thighs and hands. Patient will benefit from continued inpatient follow up therapy, <3 hours/day.  Pt currently with functional limitations due to the deficits listed below (see PT Problem List). Pt will benefit from acute skilled PT to increase their independence and safety with mobility to allow discharge.           If plan is discharge home, recommend the following: Two people to help with walking and/or transfers;Two people to help with bathing/dressing/bathroom;Assistance with cooking/housework;Assist for transportation   Can travel by private vehicle   No    Equipment Recommendations None recommended by PT (defer to next venue)  Recommendations for Other Services       Functional Status Assessment Patient has had a recent decline in their functional status and demonstrates the ability to make significant improvements in function in a reasonable and predictable amount of time.      Precautions / Restrictions Precautions Precautions: Fall Restrictions Weight Bearing Restrictions: No      Mobility  Bed Mobility Overal bed mobility: Needs Assistance Bed Mobility: Supine to Sit, Sit to Supine     Supine to sit: Min assist, Used rails Sit to supine: Min assist   General bed mobility comments: Pt able to upright trunk into sitting with bedrail from flat bed position, min A to scoot forward and place feet on floor; min A to lift BLE back into bed and reposition to comfort    Transfers Overall transfer level: Needs assistance Equipment used: Rolling walker (2 wheels) Transfers: Sit to/from Stand Sit to Stand: Max assist, From elevated surface           General transfer comment: max A to power up to standing from elevated bed, initial attempt unsuccessful with pt unable to fully extend BLE, 2nd attempt pt able to come to full standing, BLE braced against bed then returns to sitting after ~10 sec, 3rd attempt using rocking momentum, pulling from braced RW and cued for pushing LE into floor to assist in powering up with max A; pt maintains forward flexed at the hips in standing    Ambulation/Gait               General Gait Details: pt able to take 2 sidesteps with bed behind pt, no LE buckling noted, unable to take steps away from bed, strong UE support on RW and max A  Stairs            Wheelchair Mobility     Tilt Bed    Modified Rankin (Stroke  Patients Only)       Balance Overall balance assessment: Needs assistance Sitting-balance support: Feet supported Sitting balance-Leahy Scale: Fair     Standing balance support: Reliant on assistive device for balance, During functional activity, Bilateral upper extremity supported Standing balance-Leahy Scale: Zero                               Pertinent Vitals/Pain Pain Assessment Pain Assessment: 0-10 Pain Score: 8  Pain Location: bottoms of both feet "numb and stabbing",  inner thighs "burning and rubbing", hands "going numb" Pain Descriptors / Indicators: Constant Pain Intervention(s): Limited activity within patient's tolerance, Monitored during session, Repositioned    Home Living Family/patient expects to be discharged to:: Shelter/Homeless                   Additional Comments: Pt reports recently [place] back [place] the states from [place], is trying to move to [place] to live with her brother. Pt tearful, reports currently homeless without elaborating    Prior Function Prior Level of Function : Independent/Modified Independent;Driving             Mobility Comments: pt reports ind with ambulation using rollator, takes seated rest breaks often ADLs Comments: pt reports ind with self care, needing seated rest breaks     Extremity/Trunk Assessment   Upper Extremity Assessment Upper Extremity Assessment: Defer to OT evaluation    Lower Extremity Assessment Lower Extremity Assessment: RLE deficits/detail;LLE deficits/detail;Generalized weakness RLE Deficits / Details: LE edema noted, AROM WFL, grossly 3+/5 RLE Sensation: decreased light touch (bottom of feet numb) RLE Coordination: WNL LLE Deficits / Details: LE edema noted, AROM WFL, grossly 3+/5 LLE Sensation: decreased light touch (bottom of feet numb) LLE Coordination: WNL    Cervical / Trunk Assessment Cervical / Trunk Assessment: Kyphotic  Communication   Communication Communication: No apparent difficulties  Cognition Arousal: Alert Behavior During Therapy: Flat affect, Lability Overall Cognitive Status: No family/caregiver present to determine baseline cognitive functioning                                 General Comments: pt tearful regarding current home life, tangential needing redirection        General Comments      Exercises     Assessment/Plan    PT Assessment Patient needs continued PT services  PT Problem List Decreased strength;Decreased  activity tolerance;Decreased balance;Decreased mobility;Decreased knowledge of use of DME;Impaired sensation;Pain;Obesity       PT Treatment Interventions DME instruction;Gait training;Functional mobility training;Therapeutic activities;Therapeutic exercise;Balance training;Neuromuscular re-education;Patient/family education;Wheelchair mobility training    PT Goals (Current goals can be found in the Care Plan section)  Acute Rehab PT Goals Patient Stated Goal: "move to [place] with my brother" PT Goal Formulation: With patient Time For Goal Achievement: 02/11/23 Potential to Achieve Goals: Good    Frequency Min 1X/week     Co-evaluation               AM-PAC PT "6 Clicks" Mobility  Outcome Measure Help needed turning from your back to your side while in a flat bed without using bedrails?: A Little Help needed moving from lying on your back to sitting on the side of a flat bed without using bedrails?: A Little Help needed moving to and from a bed to a chair (including a wheelchair)?: Total Help needed standing up from a  chair using your arms (e.g., wheelchair or bedside chair)?: A Lot Help needed to walk in hospital room?: Total Help needed climbing 3-5 steps with a railing? : Total 6 Click Score: 11    End of Session Equipment Utilized During Treatment: Gait belt Activity Tolerance: Patient tolerated treatment well;Patient limited by pain Patient left: in bed;with call bell/phone within reach;with bed alarm set Nurse Communication: Mobility status;Need for lift equipment PT Visit Diagnosis: Unsteadiness on feet (R26.81);Muscle weakness (generalized) (M62.81);Pain;Difficulty in walking, not elsewhere classified (R26.2) Pain - Right/Left:  (bil) Pain - part of body:  (bottom of feet)    Time: 1610-9604 PT Time Calculation (min) (ACUTE ONLY): 22 min   Charges:   PT Evaluation $PT Eval Moderate Complexity: 1 Mod   PT General Charges $$ ACUTE PT VISIT: 1 Visit          Tori Mayme Profeta PT, DPT 01/28/23, 2:29 PM

## 2023-01-28 NOTE — TOC PASRR Note (Signed)
30 Day PASRR Note   Patient Details  Name: Shannon [name] Date of Birth: Mar 20, 1965   Transition of Care [facility]) CM/SW Contact:    Lanier Clam, RN Phone Number: 01/28/2023, 4:08 PM  To Whom It May Concern:  Please be advised that this patient will require a short-term nursing home stay - anticipated 30 days or less for rehabilitation and strengthening.   The plan is for return home.

## 2023-01-28 NOTE — Assessment & Plan Note (Addendum)
Suspect this may be playing a major role in her presentations to hospitals last month and again today: see PCPs note from 01/01/23 just prior to ED presentation and psych admission on same day. SW consult (looks like SW has been following her case for quite some time now).

## 2023-01-28 NOTE — Progress Notes (Signed)
Patient educated on safety and fall precautions. Patient instructed to use call bell and call for assistance when needing to get up. Patient told nurse tech that she will not call and if she needs to get up she will.

## 2023-01-29 ENCOUNTER — Observation Stay (HOSPITAL_BASED_OUTPATIENT_CLINIC_OR_DEPARTMENT_OTHER): Payer: Medicare Other

## 2023-01-29 DIAGNOSIS — I509 Heart failure, unspecified: Secondary | ICD-10-CM

## 2023-01-29 DIAGNOSIS — R770 Abnormality of albumin: Secondary | ICD-10-CM | POA: Diagnosis not present

## 2023-01-29 DIAGNOSIS — F17210 Nicotine dependence, cigarettes, uncomplicated: Secondary | ICD-10-CM | POA: Diagnosis not present

## 2023-01-29 DIAGNOSIS — R609 Edema, unspecified: Secondary | ICD-10-CM | POA: Diagnosis not present

## 2023-01-29 DIAGNOSIS — E559 Vitamin D deficiency, unspecified: Secondary | ICD-10-CM | POA: Diagnosis not present

## 2023-01-29 DIAGNOSIS — R0602 Shortness of breath: Secondary | ICD-10-CM | POA: Diagnosis not present

## 2023-01-29 DIAGNOSIS — R2689 Other abnormalities of gait and mobility: Secondary | ICD-10-CM | POA: Diagnosis not present

## 2023-01-29 DIAGNOSIS — M6281 Muscle weakness (generalized): Secondary | ICD-10-CM | POA: Diagnosis not present

## 2023-01-29 DIAGNOSIS — R600 Localized edema: Secondary | ICD-10-CM | POA: Diagnosis not present

## 2023-01-29 DIAGNOSIS — E876 Hypokalemia: Secondary | ICD-10-CM | POA: Diagnosis not present

## 2023-01-29 DIAGNOSIS — D649 Anemia, unspecified: Secondary | ICD-10-CM | POA: Diagnosis not present

## 2023-01-29 DIAGNOSIS — Z59811 Housing instability, housed, with risk of homelessness: Secondary | ICD-10-CM | POA: Diagnosis not present

## 2023-01-29 DIAGNOSIS — G629 Polyneuropathy, unspecified: Secondary | ICD-10-CM | POA: Diagnosis not present

## 2023-01-29 DIAGNOSIS — Z79899 Other long term (current) drug therapy: Secondary | ICD-10-CM | POA: Diagnosis not present

## 2023-01-29 DIAGNOSIS — K760 Fatty (change of) liver, not elsewhere classified: Secondary | ICD-10-CM | POA: Diagnosis not present

## 2023-01-29 DIAGNOSIS — R2681 Unsteadiness on feet: Secondary | ICD-10-CM | POA: Diagnosis not present

## 2023-01-29 LAB — BASIC METABOLIC PANEL
Anion gap: 9 (ref 5–15)
BUN: 5 mg/dL — ABNORMAL LOW (ref 6–20)
CO2: 25 mmol/L (ref 22–32)
Calcium: 8.2 mg/dL — ABNORMAL LOW (ref 8.9–10.3)
Chloride: 105 mmol/L (ref 98–111)
Creatinine, Ser: 0.84 mg/dL (ref 0.44–1.00)
GFR, Estimated: >60 mL/min (ref >60)
Glucose, Bld: 108 mg/dL — ABNORMAL HIGH (ref 70–99)
Potassium: 3.3 mmol/L — ABNORMAL LOW (ref 3.5–5.1)
Sodium: 139 mmol/L (ref 135–145)

## 2023-01-29 LAB — PHOSPHORUS: Phosphorus: 3.5 mg/dL (ref 2.5–4.6)

## 2023-01-29 LAB — ECHOCARDIOGRAM COMPLETE
AR max vel: 2.04 cm2
AV Area VTI: 2.45 cm2
AV Area mean vel: 2.14 cm2
AV Mean grad: 10 mm[Hg]
AV Peak grad: 17.3 mm[Hg]
Ao pk vel: 2.08 m/s
Area-P 1/2: 4.89 cm2
Calc EF: 74.9 %
Height: 64 in
MV VTI: 2.67 cm2
P 1/2 time: 337 ms
S' Lateral: 2.8 cm
Single Plane A2C EF: 78.5 %
Single Plane A4C EF: 71.3 %
Weight: 3648 [oz_av]

## 2023-01-29 LAB — MAGNESIUM: Magnesium: 1.7 mg/dL (ref 1.7–2.4)

## 2023-01-29 LAB — HEMOGLOBIN A1C
Hgb A1c MFr Bld: 5.5 % (ref 4.8–5.6)
Mean Plasma Glucose: 111.15 mg/dL

## 2023-01-29 LAB — PROTIME-INR
INR: 1 (ref 0.8–1.2)
Prothrombin Time: 13.4 s (ref 11.4–15.2)

## 2023-01-29 MED ORDER — SIMETHICONE 40 MG/0.6ML PO SUSP
40.0000 mg | Freq: Four times a day (QID) | ORAL | Status: DC | PRN
  Filled 2023-01-29: qty 0.6

## 2023-01-29 MED ORDER — ALUM & MAG HYDROXIDE-SIMETH 200-200-20 MG/5ML PO SUSP
30.0000 mL | Freq: Four times a day (QID) | ORAL | Status: DC | PRN
  Administered 2023-01-29: 30 mL via ORAL
  Filled 2023-01-29 (×2): qty 30

## 2023-01-29 MED ORDER — POTASSIUM CHLORIDE CRYS ER 20 MEQ PO TBCR
40.0000 meq | EXTENDED_RELEASE_TABLET | Freq: Once | ORAL | Status: AC
  Administered 2023-01-29: 40 meq via ORAL
  Filled 2023-01-29: qty 2

## 2023-01-29 MED ORDER — ENSURE ENLIVE PO LIQD
237.0000 mL | Freq: Two times a day (BID) | ORAL | Status: DC
  Administered 2023-01-29 – 2023-02-03 (×8): 237 mL via ORAL

## 2023-01-29 MED ORDER — ADULT MULTIVITAMIN W/MINERALS CH
1.0000 | ORAL_TABLET | Freq: Two times a day (BID) | ORAL | Status: DC
  Administered 2023-01-29 – 2023-02-04 (×12): 1 via ORAL
  Filled 2023-01-29 (×12): qty 1

## 2023-01-29 MED ORDER — CALCIUM CARBONATE ANTACID 500 MG PO CHEW
1.0000 | CHEWABLE_TABLET | Freq: Three times a day (TID) | ORAL | Status: DC
  Administered 2023-01-29 – 2023-02-04 (×13): 200 mg via ORAL
  Filled 2023-01-29 (×15): qty 1

## 2023-01-29 MED ORDER — GABAPENTIN 100 MG PO CAPS
200.0000 mg | ORAL_CAPSULE | Freq: Once | ORAL | Status: AC
  Administered 2023-01-29: 200 mg via ORAL
  Filled 2023-01-29: qty 2

## 2023-01-29 MED ORDER — GABAPENTIN 300 MG PO CAPS
300.0000 mg | ORAL_CAPSULE | Freq: Three times a day (TID) | ORAL | Status: DC
  Administered 2023-01-29 – 2023-02-04 (×18): 300 mg via ORAL
  Filled 2023-01-29 (×18): qty 1

## 2023-01-29 MED ORDER — ADULT MULTIVITAMIN W/MINERALS CH: 1.0000 | ORAL_TABLET | Freq: Two times a day (BID) | ORAL | Status: DC

## 2023-01-29 NOTE — Progress Notes (Signed)
Pt pulled out IV and refused another one to be placed. Jarvis Newcomer, MD notified and aware. Stated its okay for pt not to have IV at this time.

## 2023-01-29 NOTE — Evaluation (Signed)
Occupational Therapy Evaluation Patient Details Name: Shannon [name] MRN: [id number] DOB: [birth year] Today's Date: 01/29/2023   History of Present Illness Patient is a 57 year old female who is presenting due to feeling generally unwell and lightheaded and BLE swelling.  PMH: anemia, morbid obesity, chronic pain, bipolar disease, electrolyte disturbances, recent psychiatric admission 11/8-12/3   Clinical Impression   Pt was using a rollator, driving and completing ADLs independently until she was unable to doff her compression sock and called EMS. Pt reports ability to function is highly dependent on her B UE and LE neuropathy with variable pain and LE edema. Pt presents with generalized weakness and decreased standing balance. She reports her nutrition has been poor. Her LE edema has improved from admission. Patient will benefit from continued inpatient follow up therapy, <3 hours/day.        If plan is discharge home, recommend the following: A little help with walking and/or transfers;A little help with bathing/dressing/bathroom;Assistance with cooking/housework    Functional Status Assessment  Patient has had a recent decline in their functional status and demonstrates the ability to make significant improvements in function in a reasonable and predictable amount of time.  Equipment Recommendations  None recommended by OT    Recommendations for Other Services       Precautions / Restrictions Precautions Precautions: Fall Restrictions Weight Bearing Restrictions: No      Mobility Bed Mobility Overal bed mobility: Modified Independent             General bed mobility comments: no physical assist    Transfers Overall transfer level: Needs assistance Equipment used: Rolling walker (2 wheels) Transfers: Sit to/from Stand Sit to Stand: Supervision, From elevated surface           General transfer comment: pt reports difficulty pushing with hands into standing at  times due to neuropathic pain in hands, elevated surface      Balance Overall balance assessment: Needs assistance   Sitting balance-Leahy Scale: Good Sitting balance - Comments: no LOB reaching feet   Standing balance support: Reliant on assistive device for balance, During functional activity, Bilateral upper extremity supported Standing balance-Leahy Scale: Fair                             ADL either performed or assessed with clinical judgement   ADL Overall ADL's : Needs assistance/impaired Eating/Feeding: Independent;Bed level   Grooming: Set up;Sitting   Upper Body Bathing: Supervision/ safety;Sitting   Lower Body Bathing: Sitting/lateral leans;Supervison/ safety   Upper Body Dressing : Set up;Sitting   Lower Body Dressing: Set up;Sitting/lateral leans                       Vision Baseline Vision/History: 1 Wears glasses Patient Visual Report: No change from baseline Additional Comments: reading glasses are at home, pt squints, but reports adequate distance acuity     Perception         Praxis         Pertinent Vitals/Pain Pain Assessment Pain Assessment: Faces Faces Pain Scale: Hurts little more Pain Location: bottoms of both feet "numb and stabbing", inner thighs "burning and rubbing", hands "going numb" Pain Descriptors / Indicators: Discomfort, Constant Pain Intervention(s): Monitored during session, Repositioned     Extremity/Trunk Assessment Upper Extremity Assessment Upper Extremity Assessment: Right hand dominant;RUE deficits/detail;LUE deficits/detail RUE Sensation: history of peripheral neuropathy LUE Sensation: history of peripheral neuropathy   Lower Extremity Assessment  Lower Extremity Assessment: Defer to PT evaluation       Communication Communication Communication: No apparent difficulties   Cognition Arousal: Alert Behavior During Therapy: WFL for tasks assessed/performed Overall Cognitive Status: Within  Functional Limits for tasks assessed                                 General Comments: pt aware of her medical situation and able to describe her symptoms     General Comments       Exercises     Shoulder Instructions      Home Living Family/patient expects to be discharged to:: Shelter/Homeless                                 Additional Comments: pt currently staying in a motel      Prior Functioning/Environment Prior Level of Function : Independent/Modified Independent;Driving             Mobility Comments: pt reports ind with ambulation using rollator, takes seated rest breaks often ADLs Comments: pt reports ind with self care, needing seated rest breaks        OT Problem List: Impaired balance (sitting and/or standing);Impaired UE functional use      OT Treatment/Interventions: Self-care/ADL training;DME and/or AE instruction;Therapeutic activities;Patient/family education;Balance training    OT Goals(Current goals can be found in the care plan section) Acute Rehab OT Goals OT Goal Formulation: With patient Time For Goal Achievement: 02/12/23 Potential to Achieve Goals: Good  OT Frequency: Min 1X/week    Co-evaluation              AM-PAC OT "6 Clicks" Daily Activity     Outcome Measure Help from another person eating meals?: None Help from another person taking care of personal grooming?: A Little Help from another person toileting, which includes using toliet, bedpan, or urinal?: A Little Help from another person bathing (including washing, rinsing, drying)?: A Little Help from another person to put on and taking off regular upper body clothing?: A Little Help from another person to put on and taking off regular lower body clothing?: A Little 6 Click Score: 19   End of Session Equipment Utilized During Treatment: Rolling walker (2 wheels)  Activity Tolerance: Patient tolerated treatment well Patient left: in bed;with  call bell/phone within reach  OT Visit Diagnosis: Unsteadiness on feet (R26.81);Other abnormalities of gait and mobility (R26.89);Muscle weakness (generalized) (M62.81)                Time: 1610-9604 OT Time Calculation (min): 34 min Charges:  OT General Charges $OT Visit: 1 Visit OT Evaluation $OT Eval Moderate Complexity: 1 Mod OT Treatments $Self Care/Home Management : 8-22 mins Berna Spare, OTR/L Acute Rehabilitation Services Office: [phone]   Evern Bio 01/29/2023, 10:37 AM

## 2023-01-29 NOTE — Consult Note (Signed)
Value-Based Care Institute [facility] Liaison Consult Note    01/29/2023  Shannon [name] Jul 06, 1965 [id number]  [facility] Patient:  Active patient with Chronic Care Management made aware of admission by [facility] RN CM  Patient reviewed remote coverage review for patient admitted to [facility]   Primary Care Provider:  Avanell Shackleton, NP-C with [place] [facility]), this provider is listed to provide the community transition of care follow up and [facility] calls  Insurance: [employer]  Patient is currently active with [facility] for care coordination services.  Patient has been engaged by a [employer] or and Tourist information centre manager.  The community based plan of care has focused on disease management and community resource support.     Plan: Continue to follow for any additional community care coordination needs for post hospital/community needs.Patient is currently for SNF rehab for disposition.    Of note, [facility] services does not replace or interfere with any services that are needed or arranged by inpatient [facility] care management team.   Charlesetta Shanks, RN, BSN, CCM [place]  [facility], Population Health, [facility] Liaison Direct Dial: [phone] or secure chat Email: [email or web address]

## 2023-01-29 NOTE — TOC Progression Note (Addendum)
Transition of Care [facility]) - Progression Note    Patient Details  Name: Shannon [name] MRN: [id number] Date of Birth: 01-Jul-1965  Transition of Care [facility]) CM/SW Contact  Chantale Leugers, Olegario Messier, RN Phone Number: 01/29/2023, 10:30 AM  Clinical Narrative:  No bed offers for ST SNF;awaiting pasrr.  -10:45a awaiting pasrr level 2 eval.    Expected Discharge Plan: Skilled Nursing Facility Barriers to Discharge: Continued Medical Work up  Expected Discharge Plan and Services   Discharge Planning Services: CM Consult Post Acute Care Choice: Skilled Nursing Facility Living arrangements for the past 2 months: Hotel/Motel                                       Social Determinants of Health (SDOH) Interventions SDOH Screenings   Food Insecurity: Food Insecurity Present (01/28/2023)  Housing: Patient Declined (01/01/2023)  Transportation Needs: Patient Declined (01/01/2023)  Utilities: Not At Risk (08/29/2022)  Alcohol Screen: Low Risk  (10/07/2021)  Depression (PHQ2-9): High Risk (01/01/2023)  Financial Resource Strain: High Risk (01/01/2023)  Physical Activity: Inactive (01/01/2023)  Social Connections: Socially Isolated (01/01/2023)  Stress: Stress Concern Present (01/01/2023)  Tobacco Use: High Risk (01/27/2023)    Readmission Risk Interventions     No data to display

## 2023-01-29 NOTE — Progress Notes (Signed)
Initial Nutrition Assessment  INTERVENTION:   -Ensure Plus High Protein po BID, each supplement provides 350 kcal and 20 grams of protein.   -Multivitamin with minerals BID given history of gastric sleeve  NUTRITION DIAGNOSIS:   Inadequate oral intake related to social / environmental circumstances as evidenced by per patient/family report.  GOAL:   Patient will meet greater than or equal to 90% of their needs  MONITOR:   PO intake, Supplement acceptance, Labs, Weight trends, I & O's  REASON FOR ASSESSMENT:   Consult Assessment of nutrition requirement/status  ASSESSMENT:   57 y.o. female with a history of CPS, peripheral neuropathy, bipolar 1 disorder, HTN, morbid obesity s/p sleeve gastrectomy who presented to the ED on 01/27/2023 with weakness and dependent edema that did not improve with lasix. Work up findings included hypokalemia (K 2.6), hypoalbuminemia (2.5) with otherwise nonelevated LFTs.  Visited patient earlier this morning, pt was in shower. RD returned later and pt was sleeping in bed, did not rouse or interact with RD. Lunch tray sitting at bedside untouched.  Per chart review, pt with history of gastric sleeve surgery in 2017. Per MD note, pt at one time was living in [place], had to return d/t subsisting on snacks and ran out of medications. Pt has been having increased weakness and edema over the past few weeks. Not sure if pt was taking daily vitamin regimen but will resume here. MD has already ordered calcium. Will order Ensure Max for additional protein. Pt ate 100% of her breakfast this morning.  Per weight records, weight has increased likely d/t fluid accumulation. Weighed ~179 lbs on 11/8. Weight had been steadily decreasing since 2023.   Medications: Calcium carbonate, KLOR-CON  Labs reviewed: Low K   NUTRITION - FOCUSED PHYSICAL EXAM:  Unable to complete, pt not participating in interaction  Diet Order:   Diet Order             Diet regular Room  service appropriate? Yes; Fluid consistency: Thin  Diet effective now                   EDUCATION NEEDS:   Not appropriate for education at this time  Skin:  Skin Assessment: Reviewed RN Assessment  Last BM:  12/4  Height:   Ht Readings from Last 1 Encounters:  01/28/23 5\' 4"  (1.626 m)    Weight:   Wt Readings from Last 1 Encounters:  01/28/23 103.4 kg    BMI:  Body mass index is 39.14 kg/m.  Estimated Nutritional Needs:   Kcal:  1600-1800  Protein:  75-90g  Fluid:  1.8L/day   Tilda Franco, MS, RD, LDN Inpatient Clinical Dietitian Contact via Secure chat

## 2023-01-29 NOTE — Progress Notes (Signed)
[facility] PROGRESS NOTE  LAELYN CORELLA (DOB: [birth year]) WUJ:[id number] PCP: Avanell Shackleton, NP-C  Brief Narrative: Shannon [name] is a 57 y.o. female with a history of CPS, peripheral neuropathy, bipolar 1 disorder, HTN, morbid obesity s/p sleeve gastrectomy who presented to the ED on 01/27/2023 with weakness and dependent edema that did not improve with lasix. Work up findings included hypokalemia (K 2.6), hypoalbuminemia (2.5) with otherwise nonelevated LFTs. Tropon (3) and BNP (50) were normal. Hgb 9.6g/dl. D-dimer negative at 0.44. Urinalysis negative, including no dipstick protein. She was admitted due to the weakness and we are pursuing SNF rehabilitation.  Subjective: Upset about a staff member whom she believes was heckling her outside her door. Door is now closed with privacy curtain drawn and sign on door stating to knock before opening. Swelling slightly better. Wants to leave IV out.  Objective: BP 115/67 (BP Location: Left Arm)   Pulse 100   Temp 98 F (36.7 C) (Oral)   Resp 20   Ht 5\' 4"  (1.626 m)   Wt 103.4 kg   SpO2 97%   BMI 39.14 kg/m   No distress, clear nonlabored, RRR, trace pitting LE edema. Alert, oriented.   U/S: Echogenic liver parenchyma with subtle nodularity of liver contour raising the question of cirrhosis. Median kPa is 3.8 (high probability of being normal)  Assessment & Plan: Edema: Multifactorial, though certainly has primary venous insufficiency given improvement with elevation, likely worsening with morbid obesity. Hypoalbuminemia definitely contributing, though none in urine, and only mild abnormality. Echo shows moderate AI, normal biventricular function. No plan to restart diuretic at this time. - Plan to supplement dietary protein - Elevated legs, apply compression stockings - Chronicity of problem (has had multiple work ups for edema dating to 2017) noted.  - Holding amlodipine as this may be contributing as well.  Weakness:  -  PT recommending SNF TOC working toward this. PASRR review requested.   Hepatic steatosis: Elastography does not suggest compensated advanced chronic liver disease. Echogenicity with subtle nodularity may suggest early cirrhotic changes. INR 1.0. HbA1c 5.5%.  - Primary treatment will be weight loss.   Morbid obesity: s/p lap band which was removed and sleeve gastrectomy performed. This is contributing to protein calorie deficiency.  - RD consult, will supplement/emphasize protein.   Hypokalemia:  - Repeat supplementation  Normocytic anemia: Anemia panel without deficiency. Hgb stable at 9.6g/dl. Could be contributing to edema, though not to a significant extent.   Neuropathy:  - Reorder gabapentin  Bipolar disorder:  - Reorder home medications.    Tyrone Nine, MD [facility] [email or web address] 01/29/2023, 12:03 PM

## 2023-01-29 NOTE — Plan of Care (Signed)
  Problem: Activity: Goal: Risk for activity intolerance will decrease Outcome: Progressing   Problem: Nutrition: Goal: Adequate nutrition will be maintained Outcome: Progressing   Problem: Pain Management: Goal: General experience of comfort will improve Outcome: Progressing   Problem: Safety: Goal: Ability to remain free from injury will improve Outcome: Progressing   Problem: Skin Integrity: Goal: Risk for impaired skin integrity will decrease Outcome: Progressing

## 2023-01-29 NOTE — Progress Notes (Signed)
  Echocardiogram 2D Echocardiogram has been performed.  Janalyn Harder 01/29/2023, 8:52 AM

## 2023-01-29 NOTE — Patient Outreach (Signed)
Care Management   Visit Note   Name: Shannon [name] MRN: [id number] DOB: January 04, 1966  Subjective: Shannon [name] is a 57 y.o. year Shannon female who is a primary care patient of Suezanne Jacquet, [name], NP-C. The Care Management team was consulted for assistance.      Message received from [name], updated team that she is currently being evaluated at [facility].   Goals Addressed             This Visit's Progress    Care Management       Current Barriers:  Care Management support related to Mixed Bipolar Disorder, Hypertension and mobility limitations Lacks caregiver support.  Corporate treasurer.  Transportation barriers Housing needs  Planned Interventions: Call received from [name]. She is currently admitted at [facility] in [place].  She reports doing well with significant improvements to overall mood since being admitted. She endorses several episodes of auditory hallucinations during the first few days of her admission. Reports the hallucinations have subsided. She anticipates being ready for transition to an assisted living or rehab facility within the next few weeks. Reports speaking with a facility in [place] that may be able to accommodate her. Reports acceptance is pending Medicaid approval. Her ability to complete ADLs remains limited due to mobility. She has collaborated with the assigned SW at [facility] regarding options for placement after discharge. Shannon [name] provided contact information for her brother, [name]. Reports he lives out of state but is agreeable to traveling to [place] to assist. Reports Medicaid approval is pending due to outstanding vehicle registration fees. Reports Shannon [name] has agreed resolve the issues regarding her vehicle and fees. The care management team will continue collaborating with Shannon [name] and the assigned facility SW, Irving Burton regarding plan for transition. Update 01/18/23: Collaboration  with assigned facility SW/Emily. Irving Burton reports difficulty with placement for [name]. Reports challenge may be d/t the nature of Shannon [name] needs. Notes [facility] [name] is considered a crisis stabilization facility, and most patients tend to transition to shelters, drug rehab facilities or half-way homes. Shannon [name] does not meet the requirements for the facilities they currently partner with. The team agrees that transition to a shelter would not be ideal d/t significant mobility limitations. Irving Burton requested to speak with the assigned Population Health SW to discuss the process for placement in a skilled nursing or similar facility.  Reports Shannon [name]'s condition is currently stable. The facility is currently keeping her onboard due to homelessness. Will message [facility] LCSW regarding Emily's request. Update 01/27/23:  Brief outreach with [name] staff. Reports Shannon [name] was discharged from the unit. Reports speaking with Irving Burton earlier today but was unable to reach her during the call to confirm [name]'s current location.  Message left for Irving Burton to contact the unit or call RNCM when she is available. Update 01/28/23: Message received from Shannon [name] to inform care team that she is currently being evaluated at [facility]. Requested that [facility] contact her friend/support [name] regarding updated plan for housing and transportation.  Completed outreach with [employer] [employer]. Reports Shannon [name] now has a working vehicle. Also notes she was able to communicate with her brother in [place]. Updated plan was to travel to [place] to receive needed housing assistance and family support  however she required emergent care. Per chart review, Shannon [name] is being evaluated at [facility] for hypokalemia. Her mobility remains limited. Messaged the assigned [facility] TOC Nurse Case Manager via  Secure Chat to determine if patient will be discharged back to a hotel or if she will transition to SNF for rehabilitation-Pending  Response Will update Hospital Liaisons and LCSW regarding patient's current status.              PLAN Pending response from [facility] Case Manager Will update [facility] Liaisons and LCSW regarding patient's current status.   Katina Degree [facility]  [facility], [facility] RN Care Manager Direct Dial: [phone] Website: [email or web address]

## 2023-01-30 DIAGNOSIS — Z59811 Housing instability, housed, with risk of homelessness: Secondary | ICD-10-CM | POA: Diagnosis not present

## 2023-01-30 DIAGNOSIS — R770 Abnormality of albumin: Secondary | ICD-10-CM | POA: Diagnosis not present

## 2023-01-30 DIAGNOSIS — R609 Edema, unspecified: Secondary | ICD-10-CM | POA: Diagnosis not present

## 2023-01-30 DIAGNOSIS — R0602 Shortness of breath: Secondary | ICD-10-CM | POA: Diagnosis not present

## 2023-01-30 DIAGNOSIS — G629 Polyneuropathy, unspecified: Secondary | ICD-10-CM | POA: Diagnosis not present

## 2023-01-30 DIAGNOSIS — K760 Fatty (change of) liver, not elsewhere classified: Secondary | ICD-10-CM | POA: Diagnosis not present

## 2023-01-30 DIAGNOSIS — Z79899 Other long term (current) drug therapy: Secondary | ICD-10-CM | POA: Diagnosis not present

## 2023-01-30 DIAGNOSIS — R2681 Unsteadiness on feet: Secondary | ICD-10-CM | POA: Diagnosis not present

## 2023-01-30 DIAGNOSIS — E876 Hypokalemia: Secondary | ICD-10-CM | POA: Diagnosis not present

## 2023-01-30 DIAGNOSIS — F17210 Nicotine dependence, cigarettes, uncomplicated: Secondary | ICD-10-CM | POA: Diagnosis not present

## 2023-01-30 DIAGNOSIS — M6281 Muscle weakness (generalized): Secondary | ICD-10-CM | POA: Diagnosis not present

## 2023-01-30 DIAGNOSIS — R600 Localized edema: Secondary | ICD-10-CM | POA: Diagnosis not present

## 2023-01-30 DIAGNOSIS — E559 Vitamin D deficiency, unspecified: Secondary | ICD-10-CM | POA: Diagnosis not present

## 2023-01-30 DIAGNOSIS — D649 Anemia, unspecified: Secondary | ICD-10-CM | POA: Diagnosis not present

## 2023-01-30 DIAGNOSIS — R2689 Other abnormalities of gait and mobility: Secondary | ICD-10-CM | POA: Diagnosis not present

## 2023-01-30 MED ORDER — PREGABALIN 75 MG PO CAPS
75.0000 mg | ORAL_CAPSULE | Freq: Two times a day (BID) | ORAL | Status: DC
  Administered 2023-01-30 – 2023-02-04 (×10): 75 mg via ORAL
  Filled 2023-01-30 (×10): qty 1

## 2023-01-30 NOTE — Progress Notes (Signed)
[facility] PROGRESS NOTE  Shannon [name] (DOB: [birth year]) WUX:[id number] PCP: Avanell Shackleton, NP-C  Brief Narrative: Shannon [name] is a 57 y.o. female with a history of CPS, peripheral neuropathy, bipolar 1 disorder, HTN, morbid obesity s/p sleeve gastrectomy who presented to the ED on 01/27/2023 with weakness and dependent edema that did not improve with lasix. Work up findings included hypokalemia (K 2.6), hypoalbuminemia (2.5) with otherwise nonelevated LFTs. Tropon (3) and BNP (50) were normal. Hgb 9.6g/dl. D-dimer negative at 0.44. Urinalysis negative, including no dipstick protein. She was admitted due to the weakness and we are pursuing SNF rehabilitation.  Subjective: Still with reflux symptoms not improved with protonix or other measures. Eating protein, started ensure, ordered chicken wings.   Objective: BP 129/73 (BP Location: Right Wrist)   Pulse (!) 105   Temp 98.7 F (37.1 C) (Oral)   Resp 14   Ht 5\' 4"  (1.626 m)   Wt 103.4 kg   SpO2 93%   BMI 39.14 kg/m   Gen: No distress Pulm: Clear, nonlabored  CV: RRR, no MRG GI: Soft, NT, ND, +BS Neuro: Alert and oriented. No new focal deficits. Ext: Warm, no deformities, much improved LE edema. Skin: No rashes, lesions or ulcers on visualized skin   Assessment & Plan: Edema: Multifactorial, though certainly has primary venous insufficiency given improvement with elevation, likely worsening with morbid obesity. Hypoalbuminemia definitely contributing, though none in urine, and only mild abnormality. Echo shows moderate AI, normal biventricular function. No plan to restart diuretic at this time. - Continue to supplement dietary protein - Elevated legs, apply compression stockings - Chronicity of problem (has had multiple work ups for edema dating to 2017) noted. Nephrogenic and cardiogenic etiologies ruled out.  - Holding amlodipine as this may be contributing as well.  Weakness:  - PT recommending SNF TOC working  toward this. PASRR review requested.   Hepatic steatosis: Elastography does not suggest compensated advanced chronic liver disease. Echogenicity with subtle nodularity may suggest early cirrhotic changes. INR 1.0. HbA1c 5.5%.  - Primary treatment will be weight loss.   Morbid obesity: s/p lap band which was removed and sleeve gastrectomy performed. This is contributing to protein calorie deficiency.  - RD consult, will supplement/emphasize protein.   Hypokalemia:  - Repeat supplementation  Normocytic anemia: Anemia panel without deficiency. Hgb stable at 9.6g/dl. Could be contributing to edema, though not to a significant extent.   Neuropathy:  - Reorder gabapentin  Bipolar disorder: Quiescent - Reorder home medications.   GERD:  - Anticipate difficulty treating in obese pt w/hx gastric sleeve. Will continue maximal PPI therapy and prns. Looks like she was on the same protonix previously per her PCP.  Tyrone Nine, MD [facility] [email or web address] 01/30/2023, 9:29 AM

## 2023-01-30 NOTE — Plan of Care (Signed)
  Problem: Safety: Goal: Ability to remain free from injury will improve Outcome: Progressing   Problem: Skin Integrity: Goal: Risk for impaired skin integrity will decrease Outcome: Progressing   Problem: Pain Management: Goal: General experience of comfort will improve Outcome: Progressing   Problem: Coping: Goal: Level of anxiety will decrease Outcome: Progressing   Problem: Nutrition: Goal: Adequate nutrition will be maintained Outcome: Progressing   Problem: Clinical Measurements: Goal: Ability to maintain clinical measurements within normal limits will improve Outcome: Progressing   Problem: Education: Goal: Knowledge of General Education information will improve Description: Including pain rating scale, medication(s)/side effects and non-pharmacologic comfort measures Outcome: Progressing

## 2023-01-30 NOTE — Progress Notes (Signed)
Patient requesting to have her bed alarm off. Per the Safety assessment/Fall risk level patient is a high falls risk, however, patient states that she wants to be as independent as possible and does not want her bed alarm on. Call bell is within reach. Patient educated on fall risks and encouraged to call for help.

## 2023-01-31 DIAGNOSIS — R0602 Shortness of breath: Secondary | ICD-10-CM | POA: Diagnosis not present

## 2023-01-31 DIAGNOSIS — E559 Vitamin D deficiency, unspecified: Secondary | ICD-10-CM | POA: Diagnosis not present

## 2023-01-31 DIAGNOSIS — K760 Fatty (change of) liver, not elsewhere classified: Secondary | ICD-10-CM | POA: Diagnosis not present

## 2023-01-31 DIAGNOSIS — Z79899 Other long term (current) drug therapy: Secondary | ICD-10-CM | POA: Diagnosis not present

## 2023-01-31 DIAGNOSIS — G629 Polyneuropathy, unspecified: Secondary | ICD-10-CM | POA: Diagnosis not present

## 2023-01-31 DIAGNOSIS — D649 Anemia, unspecified: Secondary | ICD-10-CM | POA: Diagnosis not present

## 2023-01-31 DIAGNOSIS — M6281 Muscle weakness (generalized): Secondary | ICD-10-CM | POA: Diagnosis not present

## 2023-01-31 DIAGNOSIS — R609 Edema, unspecified: Secondary | ICD-10-CM | POA: Diagnosis not present

## 2023-01-31 DIAGNOSIS — Z59811 Housing instability, housed, with risk of homelessness: Secondary | ICD-10-CM | POA: Diagnosis not present

## 2023-01-31 DIAGNOSIS — R2681 Unsteadiness on feet: Secondary | ICD-10-CM | POA: Diagnosis not present

## 2023-01-31 DIAGNOSIS — E876 Hypokalemia: Secondary | ICD-10-CM | POA: Diagnosis not present

## 2023-01-31 DIAGNOSIS — R600 Localized edema: Secondary | ICD-10-CM | POA: Diagnosis not present

## 2023-01-31 DIAGNOSIS — F17210 Nicotine dependence, cigarettes, uncomplicated: Secondary | ICD-10-CM | POA: Diagnosis not present

## 2023-01-31 DIAGNOSIS — R770 Abnormality of albumin: Secondary | ICD-10-CM | POA: Diagnosis not present

## 2023-01-31 DIAGNOSIS — R2689 Other abnormalities of gait and mobility: Secondary | ICD-10-CM | POA: Diagnosis not present

## 2023-01-31 MED ORDER — TRAMADOL HCL 50 MG PO TABS
50.0000 mg | ORAL_TABLET | Freq: Four times a day (QID) | ORAL | Status: DC | PRN
  Administered 2023-01-31: 50 mg via ORAL
  Administered 2023-01-31: 100 mg via ORAL
  Administered 2023-01-31: 50 mg via ORAL
  Administered 2023-02-01 – 2023-02-04 (×6): 100 mg via ORAL
  Filled 2023-01-31: qty 1
  Filled 2023-01-31 (×2): qty 2
  Filled 2023-01-31 (×2): qty 1
  Filled 2023-01-31 (×2): qty 2
  Filled 2023-01-31 (×3): qty 1
  Filled 2023-01-31: qty 2

## 2023-01-31 MED ORDER — TRAMADOL HCL 50 MG PO TABS
50.0000 mg | ORAL_TABLET | Freq: Once | ORAL | Status: AC
  Administered 2023-01-31: 50 mg via ORAL
  Filled 2023-01-31: qty 1

## 2023-01-31 MED ORDER — TRAMADOL HCL 50 MG PO TABS: 25.0000 mg | ORAL_TABLET | Freq: Once | ORAL | Status: DC

## 2023-01-31 NOTE — Plan of Care (Signed)
  Problem: Safety: Goal: Ability to remain free from injury will improve Outcome: Progressing   Problem: Pain Management: Goal: General experience of comfort will improve Outcome: Progressing   Problem: Coping: Goal: Level of anxiety will decrease Outcome: Progressing   Problem: Activity: Goal: Risk for activity intolerance will decrease Outcome: Progressing   Problem: Clinical Measurements: Goal: Ability to maintain clinical measurements within normal limits will improve Outcome: Progressing   Problem: Education: Goal: Knowledge of General Education information will improve Description: Including pain rating scale, medication(s)/side effects and non-pharmacologic comfort measures Outcome: Progressing

## 2023-01-31 NOTE — Progress Notes (Signed)
[facility] PROGRESS NOTE  Shannon [name] (DOB: August 23, 1965) HQI:[id number] PCP: Avanell Shackleton, NP-C  Brief Narrative: Shannon [name] is a 57 y.o. female with a history of CPS, peripheral neuropathy, bipolar 1 disorder, HTN, morbid obesity s/p sleeve gastrectomy who presented to the ED on 01/27/2023 with weakness and dependent edema that did not improve with lasix. Work up findings included hypokalemia (K 2.6), hypoalbuminemia (2.5) with otherwise nonelevated LFTs. Tropon (3) and BNP (50) were normal. Hgb 9.6g/dl. D-dimer negative at 0.44. Urinalysis negative, including no dipstick protein. She was admitted due to the weakness and we are pursuing SNF rehabilitation. She is medically stable for discharge.  Subjective: Neuropathy involving fingers R > L overnight responsive to tramadol.  Objective: BP (!) 140/64 (BP Location: Left Arm)   Pulse (!) 106   Temp 97.7 F (36.5 C) (Oral)   Resp 16   Ht 5\' 4"  (1.626 m)   Wt 103.4 kg   SpO2 92%   BMI 39.14 kg/m   Gen: No distress Pulm: Clear, nonlabored  CV: RRR, no MRG GI: Soft, NT, ND, +BS Neuro: Alert and oriented. No new focal deficits. Ext: Warm, no deformities. Very minimal edema Skin: No new rashes, lesions or ulcers on visualized skin   Assessment & Plan: Edema: Multifactorial, though certainly has primary venous insufficiency given improvement with elevation, likely worsening with morbid obesity. Hypoalbuminemia definitely contributing, though none in urine, and only mild abnormality. Echo shows moderate AI, normal biventricular function. No plan to restart diuretic at this time. - Continue to supplement dietary protein - Elevated legs, apply compression stockings - Chronicity of problem (has had multiple work ups for edema dating to 2017) noted. Nephrogenic and cardiogenic etiologies ruled out.  - Holding amlodipine as this may be contributing as well.  Weakness:  - PT recommending SNF TOC working toward this. PASRR  review, etc. Pt is medically stable for discharge.  Hepatic steatosis: Elastography does not suggest compensated advanced chronic liver disease. Echogenicity with subtle nodularity may suggest early cirrhotic changes. INR 1.0. HbA1c 5.5%.  - Primary treatment will be weight loss.   Morbid obesity: s/p lap band which was removed and sleeve gastrectomy performed. This is contributing to protein calorie deficiency.  - RD consult, will supplement/emphasize protein.   Hypokalemia:  - Repeat supplementation  Normocytic anemia: Anemia panel without deficiency. Hgb stable at 9.6g/dl. Could be contributing to edema, though not to a significant extent.   Neuropathy: Chronic, severe, debilitating problem.  - Continue gabapentin, dose has been increased, lyrica low dose added, and will give tramadol prn as well to facilitate mobility/therapy efforts.  - Expectation setting   Bipolar disorder: Quiescent - Reorder home medications.   GERD:  - Anticipate difficulty treating in obese pt w/hx gastric sleeve. Will continue maximal PPI therapy and prns. Looks like she was on the same protonix previously per her PCP.  Tyrone Nine, MD [facility] [email or web address] 01/31/2023, 12:07 PM

## 2023-01-31 NOTE — Progress Notes (Signed)
Mobility Specialist - Progress Note   01/31/23 1408  Mobility  Activity Ambulated with assistance in hallway  Level of Assistance Contact guard assist, steadying assist  Assistive Device Front wheel walker  Distance Ambulated (ft) 34 ft  Range of Motion/Exercises Active  Activity Response Tolerated fair  Mobility Referral Yes  Mobility visit 1 Mobility  Mobility Specialist Start Time (ACUTE ONLY) 1350  Mobility Specialist Stop Time (ACUTE ONLY) 1408  Mobility Specialist Time Calculation (min) (ACUTE ONLY) 18 min   Pt was found in bed and agreeable to ambulate. Pt used rocking motion when going from STS. C/o neuropathy from hands during session. Had x1 seated rest break during session due to fatigued and stated her thighs were burning from deconditioning. Pt returned to bed with all needs met. Call bell in reach and RN notified.   Billey Chang Mobility Specialist

## 2023-01-31 NOTE — Plan of Care (Signed)
  Problem: Clinical Measurements: Goal: Respiratory complications will improve Outcome: Progressing   Problem: Activity: Goal: Risk for activity intolerance will decrease Outcome: Progressing   Problem: Nutrition: Goal: Adequate nutrition will be maintained Outcome: Progressing   

## 2023-02-01 ENCOUNTER — Other Ambulatory Visit: Payer: Self-pay

## 2023-02-01 DIAGNOSIS — M6281 Muscle weakness (generalized): Secondary | ICD-10-CM | POA: Diagnosis not present

## 2023-02-01 DIAGNOSIS — R609 Edema, unspecified: Secondary | ICD-10-CM | POA: Diagnosis not present

## 2023-02-01 DIAGNOSIS — Z79899 Other long term (current) drug therapy: Secondary | ICD-10-CM | POA: Diagnosis not present

## 2023-02-01 DIAGNOSIS — F17210 Nicotine dependence, cigarettes, uncomplicated: Secondary | ICD-10-CM | POA: Diagnosis not present

## 2023-02-01 DIAGNOSIS — R0602 Shortness of breath: Secondary | ICD-10-CM | POA: Diagnosis not present

## 2023-02-01 DIAGNOSIS — R2689 Other abnormalities of gait and mobility: Secondary | ICD-10-CM | POA: Diagnosis not present

## 2023-02-01 DIAGNOSIS — K760 Fatty (change of) liver, not elsewhere classified: Secondary | ICD-10-CM | POA: Diagnosis not present

## 2023-02-01 DIAGNOSIS — E876 Hypokalemia: Secondary | ICD-10-CM | POA: Diagnosis not present

## 2023-02-01 DIAGNOSIS — R770 Abnormality of albumin: Secondary | ICD-10-CM | POA: Diagnosis not present

## 2023-02-01 DIAGNOSIS — G629 Polyneuropathy, unspecified: Secondary | ICD-10-CM | POA: Diagnosis not present

## 2023-02-01 DIAGNOSIS — R2681 Unsteadiness on feet: Secondary | ICD-10-CM | POA: Diagnosis not present

## 2023-02-01 DIAGNOSIS — Z59811 Housing instability, housed, with risk of homelessness: Secondary | ICD-10-CM | POA: Diagnosis not present

## 2023-02-01 DIAGNOSIS — D649 Anemia, unspecified: Secondary | ICD-10-CM | POA: Diagnosis not present

## 2023-02-01 DIAGNOSIS — R600 Localized edema: Secondary | ICD-10-CM | POA: Diagnosis not present

## 2023-02-01 DIAGNOSIS — E559 Vitamin D deficiency, unspecified: Secondary | ICD-10-CM | POA: Diagnosis not present

## 2023-02-01 MED ORDER — POTASSIUM CHLORIDE CRYS ER 20 MEQ PO TBCR: 20.0000 meq | EXTENDED_RELEASE_TABLET | Freq: Once | ORAL | Status: DC

## 2023-02-01 MED ORDER — HYDROCHLOROTHIAZIDE 12.5 MG PO TABS
6.2500 mg | ORAL_TABLET | Freq: Every day | ORAL | Status: DC
  Administered 2023-02-01 – 2023-02-04 (×4): 6.25 mg via ORAL
  Filled 2023-02-01 (×4): qty 1

## 2023-02-01 NOTE — TOC Progression Note (Addendum)
Transition of Care [facility]) - Progression Note    Patient Details  Name: Shannon [name] MRN: [id number] Date of Birth: 1965-11-28  Transition of [facility]) CM/SW Contact  Breeze Angell, Olegario Messier, RN Phone Number: 02/01/2023, 10:34 AM  Clinical Narrative: pasrr added to fl2. No bed offers d/t psych issues. Always [facility]isitng w/placement-no facility to accept.MD/Supv aware.   -11:44a attempted to talk to patient-she is talking to her insurance company to asst her w/a facility.Will f/u later. -12:25p-spoke to patient-she provided a tel# from her insurance for ST SNF-[facility] confirmed that tel# [phone]-is not a ST SNF-patient understood. She is now agreeable to a list of studio apts for housing.$1200. She has a rw, & car. Her brother lives in [place] be able to support).Will check HHC agencies to accept. 1:33p-Awaiting for patient to finish conversation w/her insurance company to f/u on receiving no bed offers. She is not disabled. [facility] rep Tresa Endo can provide HHPT/OT. Patient can google apts that she may want for housing.Supv Lafonda Mosses updated. -2:14p-Spoke to patient-she will do her own research on apts independent of TOC( I explained that research on apts is an independent choice of hers.Marland Kitchen TOC will f/u tomorrow with outcome of going home w/HHC since she does have a current place to live. She is looking into housing going forward.    Expected Discharge Plan: Skilled Nursing Facility Barriers to Discharge: Continued Medical Work up  Expected Discharge Plan and Services   Discharge Planning Services: CM Consult Post Acute Care Choice: Skilled Nursing Facility Living arrangements for the past 2 months: Hotel/Motel                                       Social Determinants of Health (SDOH) Interventions SDOH Screenings   Food Insecurity: Food Insecurity Present (01/28/2023)  Housing: Patient Declined (01/01/2023)  Transportation Needs: Patient Declined (01/01/2023)  Utilities:  Not At Risk (08/29/2022)  Alcohol Screen: Low Risk  (10/07/2021)  Depression (PHQ2-9): High Risk (01/01/2023)  Financial Resource Strain: High Risk (01/01/2023)  Physical Activity: Inactive (01/01/2023)  Social Connections: Socially Isolated (01/01/2023)  Stress: Stress Concern Present (01/01/2023)  Tobacco Use: High Risk (01/27/2023)    Readmission Risk Interventions     No data to display

## 2023-02-01 NOTE — Patient Outreach (Signed)
  Care Management   Visit Note  02/01/2023 Name: Shannon [name] MRN: [id number] DOB: [birth year]  Subjective: Shannon [name] is a 57 y.o. year old female who is a primary care patient of Suezanne Jacquet, [name], NP-C. The Care Management team was consulted for assistance.      Received message from Shannon [name] requesting a return call. She is currently admitted at [facility].   Returned call and engaged with Shannon [name]. Initially expressed concerns regarding decline in health and limited options for housing after discharge. Reports needing additional mental health services and would be agreeable to facilities outside of the [place] area if she can "hold on" and not harm herself. Reports feeling hopeless that her situation will not change.   She began verbalizing suicidal ideations and repeating " I can't do this anymore." RNCM instructed Shannon [name] to press her call bell, remain on line and remain in bed until staff arrives. She agreed.  RNCM placed call to unit staff while on line with Shannon [name] to inform of her current state. Per front desk, assigned nurse Shannon [name] was in route to the room. Spoke with Shannon [name] when she arrived at bedside via patient's phone. Informed of her verbalizing suicidal ideations.    PLAN Inpatient team will proceed with required protocols.    Katina Degree [facility]  [facility], [facility] RN Care Manager Direct Dial: [phone] Website: [email or web address]

## 2023-02-01 NOTE — Progress Notes (Signed)
PT Cancellation Note  Patient Details Name: Shannon [name] MRN: [id number] DOB: Feb 11, 1966   Cancelled Treatment:     Pt declined x 2 attempts.  "On the phone with my Insurance".    Armando Reichert 02/01/2023, 2:03 PM

## 2023-02-01 NOTE — Plan of Care (Signed)
  Problem: Health Behavior/Discharge Planning: Goal: Ability to manage health-related needs will improve Outcome: Progressing   Problem: Clinical Measurements: Goal: Will remain free from infection Outcome: Progressing   Problem: Pain Management: Goal: General experience of comfort will improve Outcome: Progressing

## 2023-02-01 NOTE — Progress Notes (Signed)
OT Cancellation Note  Patient Details Name: STANA WAKEFORD MRN: [id number] DOB: Apr 18, 1965   Cancelled Treatment:    Reason Eval/Treat Not Completed: Patient declined, no reason specified Patient was approached this AM with patient reporting she was scheduled to walk this afternoon around 2pm and could not do anything this AM while waiting for MD to see her today.  Patient approached this afternoon at agreed upon time with patient declining to participate reporting fatigue from trying to connect with her insurance company and case management.  OT to continue to follow and check back as schedule will allow.  Rosalio Loud, MS Acute Rehabilitation Department Office# [phone]  02/01/2023, 2:05 PM

## 2023-02-01 NOTE — Progress Notes (Addendum)
[facility] PROGRESS NOTE  Shannon [name] (DOB: 1965-09-15) WUX:[id number] PCP: Avanell Shackleton, NP-C  Brief Narrative: Shannon [name] is a 57 y.o. female with a history of CPS, peripheral neuropathy, bipolar 1 disorder, HTN, morbid obesity s/p sleeve gastrectomy who presented to the ED on 01/27/2023 with weakness and dependent edema that did not improve with lasix. Work up findings included hypokalemia (K 2.6), hypoalbuminemia (2.5) with otherwise nonelevated LFTs. Tropon (3) and BNP (50) were normal. Hgb 9.6g/dl. D-dimer negative at 0.44. Urinalysis negative, including no dipstick protein. She was admitted due to the weakness and we are pursuing SNF rehabilitation. She is medically stable for discharge.  Subjective: Edema in feet/toes is returning/waxing/waning. No dyspnea or other physical concerns voiced.   Objective: BP 115/72 (BP Location: Left Arm)   Pulse (!) 103   Temp 98 F (36.7 C) (Axillary)   Resp 16   Ht 5\' 4"  (1.626 m)   Wt 103.4 kg   SpO2 98%   BMI 39.14 kg/m   No distress Trace bipedal edema. Cap refill brisk, 2+ DP pulses  Assessment & Plan: Edema: Multifactorial, though certainly has primary venous insufficiency given improvement with elevation, likely worsening with morbid obesity. Hypoalbuminemia definitely contributing, though none in urine, and only mild abnormality. Echo shows moderate AI, normal biventricular function. No plan to restart diuretic at this time. - Continue to supplement dietary protein - Elevated legs, apply compression stockings - Chronicity of problem (has had multiple work ups for edema dating to 2017) noted. Nephrogenic and cardiogenic etiologies ruled out.  - Holding amlodipine as this may be contributing as well. Will replace with HCTZ.   Weakness:  - PT recommending SNF TOC working toward this.    Hepatic steatosis: Elastography does not suggest compensated advanced chronic liver disease. Echogenicity with subtle nodularity  may suggest early cirrhotic changes. INR 1.0. HbA1c 5.5%.  - Primary treatment will be weight loss.   Morbid obesity: s/p lap band which was removed and sleeve gastrectomy performed. This is contributing to protein calorie deficiency.  - RD consult, will supplement/emphasize protein.   Hypokalemia:  - Monitor again in AM as we are initiating thiazide diuretic. Anticipate need for daily supp.   Normocytic anemia: Anemia panel without deficiency. Hgb stable at 9.6g/dl. Could be contributing to edema, though not to a significant extent.   Neuropathy: Chronic, severe, debilitating problem.  - Continue gabapentin, dose has been increased, lyrica low dose added, and will give tramadol prn as well to facilitate mobility/therapy efforts.  - Expectation setting   Bipolar disorder: Quiescent - Reorder home medications.   GERD:  - Anticipate difficulty treating in obese pt w/hx gastric sleeve. Will continue maximal PPI therapy and prns. Looks like she was on the same protonix previously per her PCP.  Tyrone Nine, MD [facility] [email or web address] 02/01/2023, 12:34 PM

## 2023-02-01 NOTE — Progress Notes (Signed)
Pt called this RN into room stating it was urgent. Upon entering the room pt was crying and on the phone with Marti Sleigh, RN from case management. The pt gave this RN her phone so that she could talk with case management. CM informed this RN that pt had made comments about wanting to "give up". This RN sat with pt to allow her to discuss what was upsetting her. Pt stating that she feels "overwhelmed and unsure of how to fix this situation". Pt discussed current stressor in her life including being in the hospital, trying to find housing, and currently going through a divorce. Pt stated that she did not sleep well and that there was "nonstop traffic of people in her room all day." After further listening and discussion pt stated she was having "racing thoughts about being dumb, stupid, and ugly." But she was not having thoughts of harming herself. This RN told pt she could give her a PRN clonazepam and keep extra staff out of her room until dinner. Pt was agreeable and stated she would call this RN if her thoughts changed or if she became more upset.

## 2023-02-02 DIAGNOSIS — G629 Polyneuropathy, unspecified: Secondary | ICD-10-CM | POA: Diagnosis not present

## 2023-02-02 DIAGNOSIS — E512 Wernicke's encephalopathy: Secondary | ICD-10-CM | POA: Diagnosis not present

## 2023-02-02 DIAGNOSIS — R609 Edema, unspecified: Secondary | ICD-10-CM | POA: Diagnosis not present

## 2023-02-02 DIAGNOSIS — E559 Vitamin D deficiency, unspecified: Secondary | ICD-10-CM | POA: Diagnosis not present

## 2023-02-02 DIAGNOSIS — F319 Bipolar disorder, unspecified: Secondary | ICD-10-CM | POA: Diagnosis not present

## 2023-02-02 DIAGNOSIS — F17210 Nicotine dependence, cigarettes, uncomplicated: Secondary | ICD-10-CM | POA: Diagnosis not present

## 2023-02-02 DIAGNOSIS — E876 Hypokalemia: Secondary | ICD-10-CM | POA: Diagnosis not present

## 2023-02-02 DIAGNOSIS — Z79899 Other long term (current) drug therapy: Secondary | ICD-10-CM | POA: Diagnosis not present

## 2023-02-02 DIAGNOSIS — R2681 Unsteadiness on feet: Secondary | ICD-10-CM | POA: Diagnosis not present

## 2023-02-02 DIAGNOSIS — D649 Anemia, unspecified: Secondary | ICD-10-CM | POA: Diagnosis not present

## 2023-02-02 DIAGNOSIS — F4310 Post-traumatic stress disorder, unspecified: Secondary | ICD-10-CM | POA: Diagnosis not present

## 2023-02-02 DIAGNOSIS — K760 Fatty (change of) liver, not elsewhere classified: Secondary | ICD-10-CM | POA: Diagnosis not present

## 2023-02-02 DIAGNOSIS — R2689 Other abnormalities of gait and mobility: Secondary | ICD-10-CM | POA: Diagnosis not present

## 2023-02-02 DIAGNOSIS — R600 Localized edema: Secondary | ICD-10-CM | POA: Diagnosis not present

## 2023-02-02 DIAGNOSIS — R0602 Shortness of breath: Secondary | ICD-10-CM | POA: Diagnosis not present

## 2023-02-02 DIAGNOSIS — M6281 Muscle weakness (generalized): Secondary | ICD-10-CM | POA: Diagnosis not present

## 2023-02-02 DIAGNOSIS — Z59811 Housing instability, housed, with risk of homelessness: Secondary | ICD-10-CM | POA: Diagnosis not present

## 2023-02-02 DIAGNOSIS — R770 Abnormality of albumin: Secondary | ICD-10-CM | POA: Diagnosis not present

## 2023-02-02 LAB — BASIC METABOLIC PANEL
Anion gap: 7 (ref 5–15)
BUN: 10 mg/dL (ref 6–20)
CO2: 31 mmol/L (ref 22–32)
Calcium: 8.5 mg/dL — ABNORMAL LOW (ref 8.9–10.3)
Chloride: 99 mmol/L (ref 98–111)
Creatinine, Ser: 0.83 mg/dL (ref 0.44–1.00)
GFR, Estimated: >60 mL/min (ref >60)
Glucose, Bld: 97 mg/dL (ref 70–99)
Potassium: 3.9 mmol/L (ref 3.5–5.1)
Sodium: 137 mmol/L (ref 135–145)

## 2023-02-02 MED ORDER — THIAMINE HCL 100 MG/ML IJ SOLN
500.0000 mg | INTRAVENOUS | Status: AC
  Administered 2023-02-03: 500 mg via INTRAVENOUS
  Filled 2023-02-02 (×2): qty 5

## 2023-02-02 NOTE — TOC Progression Note (Signed)
Transition of Care [facility]) - Progression Note    Patient Details  Name: Shannon [name] MRN: [id number] Date of Birth: Sep 20, 1965  [facility] of [facility]) CM/SW Contact  Caliyah Sieh, Olegario Messier, RN Phone Number: 02/02/2023, 11:27 AM  Clinical Narrative: Noted in progress notes 12/9 patient verbalizing suicidal ideations. If psych eval placed & inpt psych recc patient has gone to [facility] recently they may take her again. MD updated.    Expected Discharge Plan: Skilled Nursing Facility Barriers to Discharge: Continued Medical Work up  Expected Discharge Plan and Services   Discharge Planning Services: CM Consult Post Acute Care Choice: Skilled Nursing Facility Living arrangements for the past 2 months: Hotel/Motel                                       Social Determinants of Health (SDOH) Interventions SDOH Screenings   Food Insecurity: Food Insecurity Present (01/28/2023)  Housing: Patient Declined (01/01/2023)  Transportation Needs: Patient Declined (01/01/2023)  Utilities: Not At Risk (08/29/2022)  Alcohol Screen: Low Risk  (10/07/2021)  Depression (PHQ2-9): High Risk (01/01/2023)  Financial Resource Strain: High Risk (01/01/2023)  Physical Activity: Inactive (01/01/2023)  Social Connections: Socially Isolated (01/01/2023)  Stress: Stress Concern Present (01/01/2023)  Tobacco Use: High Risk (01/27/2023)    Readmission Risk Interventions     No data to display

## 2023-02-02 NOTE — Progress Notes (Signed)
OT Cancellation Note  Patient Details Name: Shannon [name] MRN: [id number] DOB: November 07, 1965   Cancelled Treatment:    Reason Eval/Treat Not Completed: Other (comment) Patient reported " I wont be doing that today" with MD in room. OT to continue to follow and check back as schedule will allow.  Rosalio Loud, MS Acute Rehabilitation Department Office# [phone]  02/02/2023, 2:48 PM

## 2023-02-02 NOTE — Plan of Care (Signed)

## 2023-02-02 NOTE — Consult Note (Signed)
[name] Consult Evaluation  Service Date: February 02, 2023 LOS:  LOS: 0 days    Primary Psychiatric Diagnoses  Acute Delirium vs Wernicke due to malabsorption 2.  Bipolar Disorder 3.  PTSD  Assessment  VERNESSA GAILES is a 57 y.o. female admitted medically for 01/27/2023  9:06 PM for bilateral edema of her legs and hypotension/lightheadedness. She carries the psychiatric diagnoses of Bipolar Disorder and PTSD, and 2 Suicide Attempts (in 30's- cut self with knife) and multiple Psychiatric Hospitalizations (latest- [name] 12/2022) and has a past medical history of CPS, peripheral neuropathy, HTN, morbid obesity s/p sleeve gastrectomy . Psychiatry was consulted for mood disorder, feels like she's in "crisis" and endorsing SI by Alycia Rossetti B. Jarvis Newcomer MD.   Her current presentation of confusion, disorganization, and possible confabulation is most consistent with acute delirium versus Warnecke's encephalopathy secondary to malabsorption due to gastric sleeve. She does not currently meet criteria for inpatient hospitalization.  Current outpatient psychotropic medications include Seroquel, Trileptal, hydroxyzine, and BuSpar and historically she has had a good response to these medications. She was compliant with medications prior to admission as evidenced by outpatient provider report and stability for approximately a decade. On initial examination, patient is disorganized in her thinking and based on nursing report of change in behavior from this morning to this afternoon does appear to be waxing and waning in her mentation.  Due to this there is concern that she is currently undergoing acute delirium, however, there is concern for Wernicke's encephalopathy secondary to malabsorption status post gastric surgery.  This is because there is some confabulation and her initial presentation showed both protein and potassium deficiency use.  We would recommend empiric supplementation of thiamine as  well as starting delirium precautions.  She did make statements of SI, however, as these were said in a state of delirium that does not necessarily mean inpatient psychiatric hospitalization is required.  However, this is something that needs to continue to be monitored and so we will continue to follow the patient.  As on interview she did deny any thoughts of SI at present she does not require a one-to-one safety sitter at this time, however, if her behaviors do escalate she could require a Recruitment consultant. Please see plan below for detailed recommendations.   Diagnoses:  Active Hospital problems: Principal Problem:   Peripheral edema Active Problems:   Essential hypertension   Hypokalemia   Mixed bipolar I disorder (HCC)   Housing insecurity   Hypoalbuminemia   Anemia     Plan   ## Psychiatric Medication Recommendations:  -- Start thiamine supplementation 200 mg 3 times daily   ## Medical Decision Making Capacity:  Not formally assessed  ## Further Work-up:  -- Could consider Vitamin levels- Thiamine and Vit D.   -- most recent EKG on 01/27/2023 had QtC of 492 -- Pertinent labwork reviewed earlier this admission includes: CMP on admission showed K: 2.6,  Ca: 8.3,  Albumin: 2.5,  Mag= 2.0, Folate: 6.7,  B12: 316,  CBC:   ## Disposition:  -- Statements of SI made during delirium due not necessarily require inpatient hospitalization, however, they do require further follow-up so we will continue to follow the patient.  ## Behavioral / Environmental:  --  Delirium recs 1. Avoid benzodiazepines, antihistamines, anticholinergics, and minimize opiate use as these may worsen delirium. 2: Assess, prevent and manage pain as lack of treatment can result in delirium.  3: Provide appropriate lighting and clear signage; a clock  and calendar should be easily visible to the patient. 4: Monitor environmental factors. Reduce light and noise at night (close shades, turn off lights, turn off TV,  ect). Correct any alterations in sleep cycle. 5: Reorient the patient to person, place, time and situation on each encounter.  6: Correct sensory deficits if possible (replace eye glasses, hearing aids, ect). 7: Avoid restraints if able. Severely delirious patients benefit from constant observation by a sitter.     ## Safety and Observation Level:  - Based on my clinical evaluation, I estimate the patient to be at low risk of self harm in the current setting - At this time, we recommend a routine level of observation. This decision is based on my review of the chart including patient's history and current presentation, interview of the patient, mental status examination, and consideration of suicide risk including evaluating suicidal ideation, plan, intent, suicidal or self-harm behaviors, risk factors, and protective factors. This judgment is based on our ability to directly address suicide risk, implement suicide prevention strategies and develop a safety plan while the patient is in the clinical setting. Please contact our team if there is a concern that risk level has changed.  Suicide risk assessment  Patient does not current appear to have any modifiable risk factors for suicide.  Patient has following non-modifiable or demographic risk factors for suicide: psychiatric hospitalization  Patient has the following protective factors against suicide: Access to outpatient mental health care, Supportive family, and Supportive friends   Thank you for this consult request. Recommendations have been communicated to the primary team.  We will continue to follow at this time.   Lauro Franklin, MD  Psychiatric and Social History   Relevant Aspects of Hospital Course:  Admitted on 01/27/2023 for Hypokalemia, Lower Extremity, and Weakness.  Her potassium was repleted.  Changes to her blood pressure medications were made.  Nutrition was consulted to discuss deficiencies in the setting of  gastric surgery.   Patient Report:  She reports that she has been having some racing thoughts.  She reports that this is what happened yesterday and that when she has these they usually last about 1 to 2 hours.  She reports that she saw a little girl, Tanja, and that she was mad and jealous that her.  She reports that normally she is fine with little girl but today is different.  She reports that no one usually sees her bipolar because she keeps it under control.  She reports "normally it would be under control 100% so we would not be talking."  She reports hearing pumping over the last few days but does not report hearing the thumping today.  She did not reports "if I said to [place] 2 times [place] I still have that thought."  She reports I am so willing to come up in the water because otherwise I am going to drown."  When asked she reports that her medications do control her symptoms.  She reports that she has been taking her medications but that her mother was putting her out on the street.  She then reports "he did not want me because I was the ugly girl in the family."  She reports that her mother would have her and her siblings dress up to visit her mom's boyfriend in prison but that would not allow them to see their father in prison.  She reports "people say I am tough but I am weak.  She reports past  psychiatric history significant for bipolar disorder and schizophrenia.  She reports hearing her sisters say mean things to her.  She reports she has not heard her sisters in over a month or 2.  She reports 2 prior suicide attempts in her 30s-cutting with a knife because "we could not afford anything other than a knife."  She reports multiple past psychiatric hospitalizations- last [facility] 12/2022.   When asked she did not know what the month or year was discussed with her that it is currently December 2024.  When asked where she currently was she reports [name] corrected her that she is  currently at [employer].  She was able to state her full name.  When asked if fish swim in the ocean she reports "we learned how to swim so we could fish."  At this point she stated that she was done talking and no longer wanted to answer any questions.  When asked if she had any SI she reports she did not.  When asked if she had any HI she reports none.  She reports no AVH.  She reports having a headache but otherwise reports no other concerns at present.  Psych ROS:  Depression: Could not assess Anxiety:  Could not assess Mania (lifetime and current): Could not assess Psychosis: (lifetime and current): Reports hearing voices that tell her negative things  Collateral information:  Discussed patient with floor nurse.  She reports that this is her first day taking care of the patient, however, between the time the patient was interviewed in the afternoon and first thing this morning patient had significantly changed.  She reports that the patient was alert and oriented this morning but was just somewhat down from the reported episode of agitation yesterday afternoon.  She reports that patient was still interactive with more people entering her room and then compared to now where she does not want anyone entering the room.  Psychiatric History:  Information collected from patient and chart review  Prev Dx/Sx: Bipolar Disorder, PTSD Current Psych Provider: [name] Home Meds (current): BuSpar 7.5 mg BID, Trileptal 300 mg BID, Seroquel 25 mg BID and 300 mg QHS, Klonopin 0.25 mg BID PRN Previous Med Trials: Zoloft, Risperdal (EPS) Zyprexa, lithium, Lamictal (rash) Wellbutrin (twitching), Cymbalta (insomnia) increase Seroquel (muscle spasm), temazepam, Ambien (sleepwalking) and Depakote  Therapy: Could not assess  Prior ECT: Could Not Assess Prior Psych Hospitalization: Multiple- latest [facility] 12/2022  Prior Self Harm: Reports 2 past Suicide Attempts in her 30's from cutting with a  knife Prior Violence: Could Not Assess  Family Psych History: She reports her brother killed his girlfriend.  Reports another brother and a nephew are drug dealers Family Hx suicide: Could not assess  Social History:  Developmental Hx: Could not assess Educational Hx: Could not assess Occupational Hx: Could not assess Legal Hx: Could not assess Living Situation: Could not assess Spiritual Hx: Could not assess Access to weapons: She reports no but not conclusive due to fluctuating mental status  Substance History Tobacco use: Could not assess Alcohol use: Could not assess Drug use: Could not assess   Exam Findings   Psychiatric Specialty Exam:  Presentation  General Appearance: Appropriate for Environment  Eye Contact:Poor  Speech:Slow (mostly clear and coherent)  Speech Volume:Normal  Handedness:Right   Mood and Affect  Mood:Dysphoric  Affect:Congruent   Thought Process  Thought Processes:Disorganized; Irrevelant  Descriptions of Associations:Tangential  Orientation:Partial (only oriented to person, not place nor time)  Thought Content:Illogical;  Scattered  Hallucinations:Hallucinations: None  Ideas of Reference:None  Suicidal Thoughts:Suicidal Thoughts: No  Homicidal Thoughts:Homicidal Thoughts: No   Sensorium  Memory:Immediate Fair  Judgment:Impaired  Insight:Present   Executive Functions  Concentration:Poor  Attention Span:Poor  Recall:Poor  Fund of Knowledge:Poor  Language:Fair   Psychomotor Activity  Psychomotor Activity:Psychomotor Activity: Decreased   Assets  Assets:Resilience; Desire for Improvement   Sleep  Sleep:Sleep: Fair    Physical Exam: Vital signs:  Temp:  [98.3 F (36.8 C)-99.1 F (37.3 C)] 99.1 F (37.3 C) (12/10 1800) Pulse Rate:  [95-111] 95 (12/10 1800) BP: (100-143)/(65-82) 128/69 (12/10 1800) SpO2:  [99 %-100 %] 100 % (12/10 1800) Physical Exam Constitutional:      General: She is not in  acute distress.    Appearance: Normal appearance. She is obese. She is not ill-appearing or toxic-appearing.  HENT:     Head: Normocephalic and atraumatic.  Pulmonary:     Effort: Pulmonary effort is normal.     Blood pressure 128/69, pulse 95, temperature 99.1 F (37.3 C), temperature source Axillary, resp. rate 16, height 5\' 4"  (1.626 m), weight 103.4 kg, SpO2 100%. Body mass index is 39.14 kg/m.   Other History   These have been pulled in through the EMR, reviewed, and updated if appropriate.   Family History:   The patient's family history includes ADD / ADHD in her son and another family member; Alcohol abuse in her father; Asthma in her sister; Cirrhosis in her father; Diabetes in her father, maternal grandmother, and other family members; Hypertension in her brother, father, mother, paternal aunt, and sister; Stroke in her father.  Medical History: Past Medical History:  Diagnosis Date   Allergic rhinitis    Allergic rhinitis 01/25/2009        ANEMIA-IRON DEFICIENCY 01/25/2009   Anxiety    Backache 01/25/2009   Qualifier: Diagnosis of  By: Jonny Ruiz MD, Len Blalock    Bipolar 1 disorder [facility])    Chronic pain syndrome    Complete rotator cuff tear 07/21/2013   Complete tear of right rotator cuff 07/21/2013   Contact lens/glasses fitting    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   DISC DISEASE, LUMBAR 01/25/2009   Essential hypertension 01/25/2009   On Bystolic    GAD (generalized anxiety disorder) 12/28/2013   GERD (gastroesophageal reflux disease)    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   Headache, acute 12/20/2013   Hx of laparoscopic gastric banding 09/03/2010   Surgery date: 07/17/10    HYPERLIPIDEMIA 01/25/2009   Hyperlipidemia with target LDL less than 130 01/25/2009   HYPERTENSION 01/25/2009   Hypokalemia, inadequate intake 09/01/2011   Insomnia 04/04/2011   INSOMNIA-SLEEP DISORDER-UNSPEC 04/30/2009   Qualifier: Diagnosis of  By: Jonny Ruiz MD, Len Blalock    Iron deficiency anemia  01/25/2009        Labyrinthitis 02/19/2014   Leiomyoma of uterus, unspecified 08/13/2008   Overview:  Leiomyoma Of The Uterus  10/1 IMO update   Localized edema 06/12/2015   MANIC DEPRESSIVE ILLNESS 01/25/2009   pt is unsue if this is her specifc dx   Mixed bipolar I disorder (HCC) 07/12/2012   Morbid obesity (HCC) 01/25/2009   Night sweats    Osteopenia determined by x-ray 06/13/2021   Other abnormal glucose 10/26/2012   Palpitations 10/25/2013   PEPTIC ULCER DISEASE, HELICOBACTER PYLORI POSITIVE 10/03/2009   Peripheral edema 06/25/2015   Pernicious anemia 03/28/2012   Piriformis syndrome of both sides 09/02/2018   PUD (peptic ulcer disease) 10/03/2009  Right shoulder pain 05/03/2013   Dg Shoulder Right  05/03/2013   CLINICAL DATA Pain.  EXAM RIGHT SHOULDER - 2+ VIEW  COMPARISON None.  FINDINGS Acromioclavicular and glenohumeral degenerative change present. Questionable calcific density noted in the region of the supraspinatus space, possibly representing calcific supraspinatus tendinitis. This could represent a sclerotic density in the acromion. MRI of the right shoulder suggest   Sleep apnea 08/12/2016   SMOKER 01/25/2009   Qualifier: Diagnosis of  By: Jonny Ruiz MD, Len Blalock    Subacromial bursitis 05/17/2013   SUBSTANCE ABUSE 11/06/2009        Thiamin deficiency 10/30/2012   Tobacco abuse 06/09/2010   Visual disturbance 05/03/2013   Vitamin D deficiency 10/27/2012    Surgical History: Past Surgical History:  Procedure Laterality Date   ABDOMINAL HYSTERECTOMY     BLADDER SURGERY     s/p with ?diverticulitis   HAND TENDON SURGERY  1991   s/p-Right-index and middle   LAPAROSCOPIC GASTRIC BAND REMOVAL WITH LAPAROSCOPIC GASTRIC SLEEVE RESECTION N/A 08/17/2016   Procedure: LAPAROSCOPIC GASTRIC BAND REMOVAL WITH LAPAROSCOPIC GASTRIC SLEEVE RESECTION WITH UPPER ENDO;  Surgeon: Ovidio Kin, MD;  Location: [facility];  Service: General;  Laterality: N/A;   LAPAROSCOPIC GASTRIC BANDING  07/14/10   SHOULDER  ARTHROSCOPY Right 07/21/2013   Procedure: RIGHT ARTHROSCOPY SHOULDER DEBRIDMENT EXTENTSIVE,ARTHROSCOPIC REMOVE LOOSE FOREIGN BODY, BICEPS TENOLYSIS ;  Surgeon: Eulas Post, MD;  Location: [facility];  Service: Orthopedics;  Laterality: Right;   TUBAL LIGATION      Medications:   Current Facility-Administered Medications:    acetaminophen (TYLENOL) tablet 650 mg, 650 mg, Oral, Q6H PRN, 650 mg at 02/02/23 1819 **OR** acetaminophen (TYLENOL) suppository 650 mg, 650 mg, Rectal, Q6H PRN, Julian Reil, Jared M, DO   busPIRone (BUSPAR) tablet 7.5 mg, 7.5 mg, Oral, BID, Hazeline Junker B, MD, 7.5 mg at 02/02/23 0948   calcium carbonate (TUMS - dosed in mg elemental calcium) chewable tablet 200 mg of elemental calcium, 1 tablet, Oral, TID WC, Hazeline Junker B, MD, 200 mg of elemental calcium at 02/02/23 1800   clonazePAM (KLONOPIN) tablet 0.5 mg, 0.5 mg, Oral, BID PRN, Hazeline Junker B, MD, 0.5 mg at 02/02/23 0420   enoxaparin (LOVENOX) injection 40 mg, 40 mg, Subcutaneous, Q24H, Julian Reil, Jared M, DO, 40 mg at 02/02/23 4259   feeding supplement (ENSURE ENLIVE / ENSURE PLUS) liquid 237 mL, 237 mL, Oral, BID BM, Hazeline Junker B, MD, 237 mL at 02/02/23 1500   gabapentin (NEURONTIN) capsule 300 mg, 300 mg, Oral, TID, Tyrone Nine, MD, 300 mg at 02/02/23 1800   hydrochlorothiazide (HYDRODIURIL) tablet 6.25 mg, 6.25 mg, Oral, Daily, Hazeline Junker B, MD, 6.25 mg at 02/02/23 0947   hydrOXYzine (ATARAX) tablet 50 mg, 50 mg, Oral, QHS, Tyrone Nine, MD, 50 mg at 02/01/23 2105   multivitamin with minerals tablet 1 tablet, 1 tablet, Oral, BID WC, Tyrone Nine, MD, 1 tablet at 02/02/23 0815   ondansetron (ZOFRAN) tablet 4 mg, 4 mg, Oral, Q6H PRN **OR** ondansetron (ZOFRAN) injection 4 mg, 4 mg, Intravenous, Q6H PRN, Julian Reil, Jared M, DO   Oxcarbazepine (TRILEPTAL) tablet 300 mg, 300 mg, Oral, BID, Hazeline Junker B, MD, 300 mg at 02/02/23 1000   pantoprazole (PROTONIX) EC tablet 40 mg, 40 mg, Oral, BID AC, Hazeline Junker  B, MD, 40 mg at 02/02/23 1800   pregabalin (LYRICA) capsule 75 mg, 75 mg, Oral, BID, Tyrone Nine, MD, 75 mg at 02/02/23 0949   QUEtiapine (SEROQUEL) tablet  25 mg, 25 mg, Oral, BID, Hazeline Junker B, MD, 25 mg at 02/02/23 0948   QUEtiapine (SEROQUEL) tablet 300 mg, 300 mg, Oral, QHS, Hazeline Junker B, MD, 300 mg at 02/01/23 2103   simethicone (MYLICON) 40 MG/0.6ML suspension 40 mg, 40 mg, Oral, QID PRN, Tyrone Nine, MD   thiamine (VITAMIN B1) 500 mg in sodium chloride 0.9 % 50 mL IVPB, 500 mg, Intravenous, Q24H, Hazeline Junker B, MD   traMADol Janean Sark) tablet 50-100 mg, 50-100 mg, Oral, Q6H PRN, Tyrone Nine, MD, 100 mg at 02/02/23 0419  Allergies: Allergies  Allergen Reactions   Pork-Derived Products Other (See Comments) and Anaphylaxis    Religious Reasons (Muslim)   Lamictal [Lamotrigine] Rash

## 2023-02-02 NOTE — Progress Notes (Signed)
[facility] PROGRESS NOTE  Shannon [name] (DOB: Jun 01, 1965) ZOX:[id number] PCP: Avanell Shackleton, NP-C  Brief Narrative: Shannon [name] is a 57 y.o. female with a history of CPS, peripheral neuropathy, bipolar 1 disorder, HTN, morbid obesity s/p sleeve gastrectomy who presented to the ED on 01/27/2023 with weakness and dependent edema that did not improve with lasix. Work up findings included hypokalemia (K 2.6), hypoalbuminemia (2.5) with otherwise nonelevated LFTs. Tropon (3) and BNP (50) were normal. Hgb 9.6g/dl. D-dimer negative at 0.44. Urinalysis negative, including no dipstick protein. She was admitted due to the weakness and we have been pursuing SNF rehabilitation. She is medically stable for discharge. On 12/9 she developed significant recurrence of psychiatric symptoms for which psychiatry is consulted.  Subjective: Feels she's in crisis due to ongoing uncertainty regarding future plans.   Objective: BP 100/65 (BP Location: Left Arm)   Pulse (!) 111   Temp 98.7 F (37.1 C) (Oral)   Resp 16   Ht 5\' 4"  (1.626 m)   Wt 103.4 kg   SpO2 100%   BMI 39.14 kg/m   No distress Clear, nonlabored RRR, no MRG, trace nonpitting pedal edema Alert, oriented, some emotional lability but behavior is appropriate, insight fair. Mood depressed.  Assessment & Plan: Edema: Multifactorial, though certainly has primary venous insufficiency given improvement with elevation, likely worsening with morbid obesity. Hypoalbuminemia definitely contributing, though none in urine, and only mild abnormality. Echo shows moderate AI, normal biventricular function. No plan to restart diuretic at this time. - Continue to supplement dietary protein - Elevated legs, apply compression stockings - Chronicity of problem (has had multiple work ups for edema dating to 2017) noted. Nephrogenic and cardiogenic etiologies ruled out.  - Holding amlodipine as this may be contributing as well. Replaced with HCTZ.    Weakness:  - PT recommending SNF TOC working toward this.    Hepatic steatosis: Elastography does not suggest compensated advanced chronic liver disease. Echogenicity with subtle nodularity may suggest early cirrhotic changes. INR 1.0. HbA1c 5.5%.  - Primary treatment will be weight loss.   Morbid obesity: s/p lap band which was removed and sleeve gastrectomy performed. This is contributing to protein calorie deficiency.  - RD consult, will supplement/emphasize protein.   Hypokalemia:  - Resolved.   Normocytic anemia: Anemia panel without deficiency. Hgb stable at 9.6g/dl. Could be contributing to edema, though not to a significant extent.   Neuropathy: Chronic, severe, debilitating problem.  - Continue gabapentin, dose has been increased, lyrica low dose added, and will give tramadol prn as well to facilitate mobility/therapy efforts.  - Expectation setting   Bipolar disorder: Symptoms have worsened, she feels she is in crisis and reported passive suicidal thoughts. Pt was at [facility] recently. - Reordered home medications.  - Would appreciate psychiatry evaluation. Pt amenable.  GERD:  - Anticipate difficulty treating in obese pt w/hx gastric sleeve. Will continue maximal PPI therapy and prns. Looks like she was on the same protonix previously per her PCP.  Tyrone Nine, MD [facility] [email or web address] 02/02/2023, 11:59 AM

## 2023-02-03 DIAGNOSIS — E559 Vitamin D deficiency, unspecified: Secondary | ICD-10-CM | POA: Diagnosis not present

## 2023-02-03 DIAGNOSIS — R600 Localized edema: Secondary | ICD-10-CM | POA: Diagnosis not present

## 2023-02-03 DIAGNOSIS — F4310 Post-traumatic stress disorder, unspecified: Secondary | ICD-10-CM

## 2023-02-03 DIAGNOSIS — R770 Abnormality of albumin: Secondary | ICD-10-CM | POA: Diagnosis not present

## 2023-02-03 DIAGNOSIS — E876 Hypokalemia: Secondary | ICD-10-CM | POA: Diagnosis not present

## 2023-02-03 DIAGNOSIS — F319 Bipolar disorder, unspecified: Secondary | ICD-10-CM

## 2023-02-03 DIAGNOSIS — M6281 Muscle weakness (generalized): Secondary | ICD-10-CM | POA: Diagnosis not present

## 2023-02-03 DIAGNOSIS — F17210 Nicotine dependence, cigarettes, uncomplicated: Secondary | ICD-10-CM | POA: Diagnosis not present

## 2023-02-03 DIAGNOSIS — Z59811 Housing instability, housed, with risk of homelessness: Secondary | ICD-10-CM | POA: Diagnosis not present

## 2023-02-03 DIAGNOSIS — R2689 Other abnormalities of gait and mobility: Secondary | ICD-10-CM | POA: Diagnosis not present

## 2023-02-03 DIAGNOSIS — R609 Edema, unspecified: Secondary | ICD-10-CM | POA: Diagnosis not present

## 2023-02-03 DIAGNOSIS — K760 Fatty (change of) liver, not elsewhere classified: Secondary | ICD-10-CM | POA: Diagnosis not present

## 2023-02-03 DIAGNOSIS — D649 Anemia, unspecified: Secondary | ICD-10-CM | POA: Diagnosis not present

## 2023-02-03 DIAGNOSIS — E512 Wernicke's encephalopathy: Secondary | ICD-10-CM | POA: Diagnosis not present

## 2023-02-03 DIAGNOSIS — Z79899 Other long term (current) drug therapy: Secondary | ICD-10-CM | POA: Diagnosis not present

## 2023-02-03 DIAGNOSIS — R0602 Shortness of breath: Secondary | ICD-10-CM | POA: Diagnosis not present

## 2023-02-03 DIAGNOSIS — R2681 Unsteadiness on feet: Secondary | ICD-10-CM | POA: Diagnosis not present

## 2023-02-03 DIAGNOSIS — G629 Polyneuropathy, unspecified: Secondary | ICD-10-CM | POA: Diagnosis not present

## 2023-02-03 MED ORDER — BISACODYL 5 MG PO TBEC
5.0000 mg | DELAYED_RELEASE_TABLET | Freq: Once | ORAL | Status: AC
  Administered 2023-02-03: 5 mg via ORAL
  Filled 2023-02-03: qty 1

## 2023-02-03 MED ORDER — NICOTINE POLACRILEX 2 MG MT GUM: 2.0000 mg | CHEWING_GUM | OROMUCOSAL | Status: DC | PRN

## 2023-02-03 NOTE — Progress Notes (Signed)
Physical Therapy Treatment Patient Details Name: Shannon [name] MRN: [id number] DOB: 07-May-1965 Today's Date: 02/03/2023   History of Present Illness Patient is a 57 year old female who is presenting due to feeling generally unwell and lightheaded and BLE swelling.  PMH: anemia, morbid obesity, chronic pain, bipolar disease, electrolyte disturbances, recent psychiatric admission 11/8-12/3    PT Comments  Pt had loss of balance with sit to stand, requiring min assist to stabilize. Pt ambulated 31' with RW, no loss of balance, but with verbal cues for safety (pt was not receptive). She reported B posterior knee pain and B hand numbness while walking.    If plan is discharge home, recommend the following: A little help with walking and/or transfers;A little help with bathing/dressing/bathroom;Assistance with cooking/housework;Assist for transportation;Help with stairs or ramp for entrance   Can travel by private vehicle     No  Equipment Recommendations  None recommended by PT    Recommendations for Other Services       Precautions / Restrictions Precautions Precautions: Fall Precaution Comments: denies falls in past 6 months Restrictions Weight Bearing Restrictions: No     Mobility  Bed Mobility Overal bed mobility: Modified Independent Bed Mobility: Supine to Sit, Sit to Supine     Supine to sit: Modified independent (Device/Increase time), HOB elevated, Used rails Sit to supine: HOB elevated, Modified independent (Device/Increase time)        Transfers Overall transfer level: Needs assistance Equipment used: Rolling walker (2 wheels) Transfers: Sit to/from Stand Sit to Stand: Min assist           General transfer comment: VCs hand placement (pt not receptive), mild loss of balance initially in standing requiring min assist.    Ambulation/Gait Ambulation/Gait assistance: Supervision Gait Distance (Feet): 90 Feet Assistive device: Rolling walker (2  wheels) Gait Pattern/deviations: Step-through pattern, Decreased stride length Gait velocity: decr     General Gait Details: mild loss of balance initially in standing requiring min assist, pt refused gait belt, no loss of balance with ambulation, VCs to keep B hands on RW rather than reaching for foot board of bed/door knob (pt not receptive to these safety instructions); several standing rest breaks   Stairs             Wheelchair Mobility     Tilt Bed    Modified Rankin (Stroke Patients Only)       Balance Overall balance assessment: Needs assistance   Sitting balance-Leahy Scale: Good Sitting balance - Comments: no LOB reaching feet   Standing balance support: Reliant on assistive device for balance, During functional activity, Bilateral upper extremity supported Standing balance-Leahy Scale: Poor Standing balance comment: loss of balance initally in standing                            Cognition Arousal: Alert Behavior During Therapy: Flat affect, Anxious Overall Cognitive Status: No family/caregiver present to determine baseline cognitive functioning                                 General Comments: pt very upset that a female delivered her food tray, she stated she's muslim and can't be seen by men, tray was set on her sink with curtain pulled and pt was fully covered with second gown on her back, food delivery person did not actually see pt. Pt was agreeable to walk in hallway where  there are men. RN notified of pt request for no men to enter room. Pt not receptive to safety instructions (such as maintaining B hands on RW rather than reaching for foot board of bed and door handles).        Exercises      General Comments        Pertinent Vitals/Pain Pain Assessment Faces Pain Scale: Hurts little more Pain Location: "backs of knees sore" while walking, "both hands going numb" while walking; no pain at rest prior to activity Pain  Descriptors / Indicators: Discomfort Pain Intervention(s): Limited activity within patient's tolerance, Monitored during session, Repositioned    Home Living                          Prior Function            PT Goals (current goals can now be found in the care plan section) Acute Rehab PT Goals Patient Stated Goal: "move to [place] with my brother" PT Goal Formulation: With patient Time For Goal Achievement: 02/11/23 Potential to Achieve Goals: Good Progress towards PT goals: Progressing toward goals    Frequency    Min 1X/week      PT Plan      Co-evaluation              AM-PAC PT "6 Clicks" Mobility   Outcome Measure  Help needed turning from your back to your side while in a flat bed without using bedrails?: None Help needed moving from lying on your back to sitting on the side of a flat bed without using bedrails?: A Little Help needed moving to and from a bed to a chair (including a wheelchair)?: A Little Help needed standing up from a chair using your arms (e.g., wheelchair or bedside chair)?: A Little Help needed to walk in hospital room?: A Little Help needed climbing 3-5 steps with a railing? : A Little 6 Click Score: 19    End of Session   Activity Tolerance: Patient limited by pain Patient left: in bed;with call bell/phone within reach;with bed alarm set Nurse Communication: Mobility status PT Visit Diagnosis: Unsteadiness on feet (R26.81);Muscle weakness (generalized) (M62.81);Pain;Difficulty in walking, not elsewhere classified (R26.2)     Time: 1010-1025 PT Time Calculation (min) (ACUTE ONLY): 15 min  Charges:    $Gait Training: 8-22 mins PT General Charges $$ ACUTE PT VISIT: 1 Visit                    Tamala Ser PT 02/03/2023  Acute Rehabilitation Services  Office [phone]

## 2023-02-03 NOTE — Progress Notes (Signed)
PROGRESS NOTE    Shannon [name]  VFI:[id number] DOB: 05/05/1965 DOA: 01/27/2023 PCP: Avanell Shackleton, NP-C   Brief Narrative: 57 year old with past medical history significant for CPS, peripheral neuropathy, bipolar 1 disorder, hypertension, morbid obesity, status post sleeve gastrectomy presents to the ED 12/4 with weakness and dependent edema that did not improve with Lasix.  Workup finding include hypokalemia hypoalbuminemia.  BNP 50, hemoglobin 9.6, D-dimer 0.4.  She was admitted due to weakness, we have been pursuing skilled nursing facility but she has been able to ambulate with physical therapy.  So does not qualify for skilled nursing facility.  On 12/9 she developed significant recurrence of psychiatric symptoms for which psychiatric has been consulted, she reports suicidal ideation   Assessment & Plan:   Principal Problem:   Peripheral edema Active Problems:   Hypokalemia   Housing insecurity   Hypoalbuminemia   Anemia   Essential hypertension   Mixed bipolar I disorder (HCC)  1-Edema, multifactorial: Could be related to venous insufficiency, hypoalbuminemia.  Echo showed moderate AI, normal biventricular function. -Monitor off  diuretics -Elevation of leg, applying compression stocking -Continue hydrochlorothiazide  Weakness: PT initially recommending SNF, now patient has been cleared by PT  Hepatic steatosis: Need weight loss  Morbid obesity: Status post band which was removed and sleeve gastrectomy performed.  Weight loss  Hypokalemia: Resolved  Normocytic anemia: Hemoglobin is stable.  Neuropathy, chronic continue gabapentin Lyrica low-dose added  Bipolar disorder: Symptoms have worse, reports suicidal thoughts.  Psych consulted patient will see patient today again On Seroquel  GERD: Continue PPI      Nutrition Problem: Inadequate oral intake Etiology: social / environmental circumstances    Signs/Symptoms: per patient/family  report    Interventions: Ensure Enlive (each supplement provides 350kcal and 20 grams of protein), MVI  Estimated body mass index is 39.14 kg/m as calculated from the following:   Height as of this encounter: 5\' 4"  (1.626 m).   Weight as of this encounter: 103.4 kg.   DVT prophylaxis: SCD, Lovenox Code Status: Full code Family Communication: Care discussed with patient Disposition Plan:  Status is: Observation The patient remains OBS appropriate and will d/c before 2 midnights.    Consultants:  Psych   Procedures:  none  Antimicrobials:    Subjective: Lower extremity edema almost resolved, mentally she is not feeling well.  She is feeling overwhelmed.  She reports suicidal thoughts.  She would like to speak with psychiatrist again.  She think that she needs to go to an inpatient psych facility   Objective: Vitals:   02/01/23 1152 02/01/23 2113 02/02/23 0504 02/02/23 1800  BP: 115/72 (!) 143/82 100/65 128/69  Pulse: (!) 103 (!) 101 (!) 111 95  Resp: 16     Temp:  98.3 F (36.8 C) 98.7 F (37.1 C) 99.1 F (37.3 C)  TempSrc:  Oral Oral Axillary  SpO2: 98% 99% 100% 100%  Weight:      Height:       No intake or output data in the 24 hours ending 02/03/23 0811 Filed Weights   01/28/23 1214  Weight: 103.4 kg    Examination:  General exam: Appears calm and comfortable  Respiratory system: Clear to auscultation. Respiratory effort normal. Cardiovascular system: S1 & S2 heard, RRR. No JVD, murmurs, rubs, gallops or clicks. No pedal edema. Gastrointestinal system: Abdomen is nondistended, soft and nontender. No organomegaly or masses felt. Normal bowel sounds heard. Central nervous system: Alert and oriented. No focal neurological deficits. Extremities: Symmetric  5 x 5 power.   Data Reviewed: I have personally reviewed following labs and imaging studies  CBC: Recent Labs  Lab 01/27/23 2231 01/28/23 0825  WBC 9.5 8.4  NEUTROABS 6.3  --   HGB 9.6* 9.6*   HCT 29.7* 29.7*  MCV 82.3 82.3  PLT 332 326   Basic Metabolic Panel: Recent Labs  Lab 01/27/23 2231 01/28/23 0240 01/29/23 0625 02/02/23 0433  NA 141  --  139 137  K 2.6* 3.0* 3.3* 3.9  CL 104  --  105 99  CO2 29  --  25 31  GLUCOSE 112*  --  108* 97  BUN 5*  --  5* 10  CREATININE 0.85  --  0.84 0.83  CALCIUM 8.3*  --  8.2* 8.5*  MG 2.0  --  1.7  --   PHOS  --   --  3.5  --    GFR: Estimated Creatinine Clearance: 86.5 mL/min (by C-G formula based on SCr of 0.83 mg/dL). Liver Function Tests: Recent Labs  Lab 01/27/23 2231  AST 13*  ALT 9  ALKPHOS 75  BILITOT 0.5  PROT 6.5  ALBUMIN 2.5*   No results for input(s): "LIPASE", "AMYLASE" in the last 168 hours. No results for input(s): "AMMONIA" in the last 168 hours. Coagulation Profile: Recent Labs  Lab 01/29/23 0625  INR 1.0   Cardiac Enzymes: No results for input(s): "CKTOTAL", "CKMB", "CKMBINDEX", "TROPONINI" in the last 168 hours. BNP (last 3 results) No results for input(s): "PROBNP" in the last 8760 hours. HbA1C: No results for input(s): "HGBA1C" in the last 72 hours. CBG: No results for input(s): "GLUCAP" in the last 168 hours. Lipid Profile: No results for input(s): "CHOL", "HDL", "LDLCALC", "TRIG", "CHOLHDL", "LDLDIRECT" in the last 72 hours. Thyroid Function Tests: No results for input(s): "TSH", "T4TOTAL", "FREET4", "T3FREE", "THYROIDAB" in the last 72 hours. Anemia Panel: No results for input(s): "VITAMINB12", "FOLATE", "FERRITIN", "TIBC", "IRON", "RETICCTPCT" in the last 72 hours. Sepsis Labs: No results for input(s): "PROCALCITON", "LATICACIDVEN" in the last 168 hours.  No results found for this or any previous visit (from the past 240 hour(s)).       Radiology Studies: No results found.      Scheduled Meds:  busPIRone  7.5 mg Oral BID   calcium carbonate  1 tablet Oral TID WC   enoxaparin (LOVENOX) injection  40 mg Subcutaneous Q24H   feeding supplement  237 mL Oral BID BM    gabapentin  300 mg Oral TID   hydrochlorothiazide  6.25 mg Oral Daily   hydrOXYzine  50 mg Oral QHS   multivitamin with minerals  1 tablet Oral BID WC   Oxcarbazepine  300 mg Oral BID   pantoprazole  40 mg Oral BID AC   pregabalin  75 mg Oral BID   QUEtiapine  25 mg Oral BID   QUEtiapine  300 mg Oral QHS   Continuous Infusions:  thiamine (VITAMIN B1) injection Stopped (02/03/23 0737)     LOS: 0 days    Time spent: 35 minutes    Markey Deady A Zehava Turski, MD [facility]   If 7PM-7AM, please contact night-coverage [email or web address]  02/03/2023, 8:11 AM

## 2023-02-03 NOTE — TOC Progression Note (Signed)
Transition of Care [facility]) - Progression Note    Patient Details  Name: Shannon [name] MRN: [id number] Date of Birth: [birth year]  Transition of [facility] CM/SW Contact  Natash Berman, Olegario Messier, RN Phone Number: 02/03/2023, 11:02 AM  Clinical Narrative: OBS status.No bed offers. ambulating 90 ft,supv,rw.Psych-meds adjustment-no inpt psych recc. Has a home but wants housing. Plan-if stable-d/c with shelter resources vs her home.Please advise.MD/Team updated.      Expected Discharge Plan:  (TBD) Barriers to Discharge: Continued Medical Work up  Expected Discharge Plan and Services   Discharge Planning Services: CM Consult Post Acute Care Choice: Skilled Nursing Facility Living arrangements for the past 2 months: Hotel/Motel                                       Social Determinants of Health (SDOH) Interventions SDOH Screenings   Food Insecurity: Food Insecurity Present (01/28/2023)  Housing: Patient Declined (01/01/2023)  Transportation Needs: Patient Declined (01/01/2023)  Utilities: Not At Risk (08/29/2022)  Alcohol Screen: Low Risk  (10/07/2021)  Depression (PHQ2-9): High Risk (01/01/2023)  Financial Resource Strain: High Risk (01/01/2023)  Physical Activity: Inactive (01/01/2023)  Social Connections: Socially Isolated (01/01/2023)  Stress: Stress Concern Present (01/01/2023)  Tobacco Use: High Risk (01/27/2023)    Readmission Risk Interventions     No data to display

## 2023-02-03 NOTE — Plan of Care (Signed)

## 2023-02-03 NOTE — Consult Note (Signed)
Shannon [name] Psychiatry Consult Evaluation  Service Date: February 03, 2023 LOS:  LOS: 0 days    Primary Psychiatric Diagnoses  Acute Delirium vs Wernicke due to malabsorption 2.  Bipolar Disorder 3.  PTSD  Assessment  Shannon [name] is a 57 y.o. female admitted medically for 01/27/2023  9:06 PM for bilateral edema of her legs and hypotension/lightheadedness. She carries the psychiatric diagnoses of Bipolar Disorder and PTSD, and 2 Suicide Attempts (in 30's- cut self with knife) and multiple Psychiatric Hospitalizations (latest- [name] 12/2022) and has a past medical history of CPS, peripheral neuropathy, HTN, morbid obesity s/p sleeve gastrectomy . Psychiatry was consulted for mood disorder, feels like she's in "crisis" and endorsing SI by Shannon Rossetti B. Jarvis Newcomer MD.   Her current presentation of confusion, disorganization, and possible confabulation is most consistent with acute delirium versus Warnecke's encephalopathy secondary to malabsorption due to gastric sleeve. She does not currently meet criteria for inpatient hospitalization.  Current outpatient psychotropic medications include Seroquel, Trileptal, hydroxyzine, and BuSpar and historically she has had a good response to these medications. She was compliant with medications prior to admission as evidenced by outpatient provider report and stability for approximately a decade. On initial examination, patient is disorganized in her thinking and based on nursing report of change in behavior from this morning to this afternoon does appear to be waxing and waning in her mentation.  Due to this there is concern that she is currently undergoing acute delirium, however, there is concern for Wernicke's encephalopathy secondary to malabsorption status post gastric surgery.  This is because there is some confabulation and her initial presentation showed both protein and potassium deficiency use.  We would recommend empiric supplementation of thiamine as  well as starting delirium precautions.  She did make statements of SI, however, as these were said in a state of delirium that does not necessarily mean inpatient psychiatric hospitalization is required.  However, this is something that needs to continue to be monitored and so we will continue to follow the patient.  As on interview she did deny any thoughts of SI at present she does not require a one-to-one safety sitter at this time, however, if her behaviors do escalate she could require a Recruitment consultant. Please see plan below for detailed recommendations.   12/11: Sitter ordered by primary team (pt endorsed SI this AM). When I saw, she was endorsing passive SI and danced around a plan. Was still fairly confused (clarified a few points 4-5 times) however did not note confabulation, so likely some benefit from thiamine. Friend at bedside mostly worried about attention/confusion more so than suicidality   Diagnoses:  Active Hospital problems: Principal Problem:   Peripheral edema Active Problems:   Essential hypertension   Hypokalemia   Mixed bipolar I disorder (HCC)   Housing insecurity   Hypoalbuminemia   Anemia     Plan   ## Psychiatric Medication Recommendations:  -- Start thiamine supplementation 200 mg 3 times daily -- c buspirone 7.5 BID -- c oxcarb 300 BID -- c quetiapine 25 BID and 300 QHS   ## Medical Decision Making Capacity:  Not formally assessed  ## Further Work-up:  -- Could consider Vitamin levels- Thiamine and Vit D.   -- most recent EKG on 01/27/2023 had QtC of 492 -- Pertinent labwork reviewed earlier this admission includes: CMP on admission showed K: 2.6,  Ca: 8.3,  Albumin: 2.5,  Mag= 2.0, Folate: 6.7,  B12: 316,  CBC:   ## Disposition:  --  Statements of SI made mostly during during delirium due not necessarily require inpatient hospitalization, however, they do require further follow-up so we will continue to follow the patient.  ## Behavioral /  Environmental:  --  Delirium recs 1. Avoid benzodiazepines, antihistamines, anticholinergics, and minimize opiate use as these may worsen delirium. 2: Assess, prevent and manage pain as lack of treatment can result in delirium.  3: Provide appropriate lighting and clear signage; a clock and calendar should be easily visible to the patient. 4: Monitor environmental factors. Reduce light and noise at night (close shades, turn off lights, turn off TV, ect). Correct any alterations in sleep cycle. 5: Reorient the patient to person, place, time and situation on each encounter.  6: Correct sensory deficits if possible (replace eye glasses, hearing aids, ect). 7: Avoid restraints if able. Severely delirious patients benefit from constant observation by a sitter.     ## Safety and Observation Level:  - Based on my clinical evaluation, I estimate the patient to be at low risk of self harm in the current setting - At this time, we recommend a routine level of observation. This decision is based on my review of the chart including patient's history and current presentation, interview of the patient, mental status examination, and consideration of suicide risk including evaluating suicidal ideation, plan, intent, suicidal or self-harm behaviors, risk factors, and protective factors. This judgment is based on our ability to directly address suicide risk, implement suicide prevention strategies and develop a safety plan while the patient is in the clinical setting. Please contact our team if there is a concern that risk level has changed.  Suicide risk assessment  Patient does not current appear to have any modifiable risk factors for suicide.  Patient has following non-modifiable or demographic risk factors for suicide: psychiatric hospitalization  Patient has the following protective factors against suicide: Access to outpatient mental health care, Supportive family, and Supportive friends   Thank you  for this consult request. Recommendations have been communicated to the primary team.  We will continue to follow at this time.   Shannon [name]  Psychiatric and Social History   Relevant Aspects of Hospital Course:  Admitted on 01/27/2023 for Hypokalemia, Lower Extremity, and Weakness.  Her potassium was repleted.  Changes to her blood pressure medications were made.  Nutrition was consulted to discuss deficiencies in the setting of gastric surgery.   Patient Report:  Pt is oddly related - keeps eyes shut throughout interivew. Talks about how she has voices telling her she is "ugly, black, and worthless". She remembers talking to Dr. Renaldo Fiddler yesterday and spends most of interview alternating between discussing value of openness and expressing upset that the hospital staff "tricked" her into seeing a female doctor . Makes several bizarre statements throughout interview ie doesn't want to go to a psych unit with an attic because of the morphine that drugs patients (?). Gets upset when I talk about the girl "tanja" she saw yesterday - brings up several times that she only hears her, doesn't speak to her. Endorsed to me mostly chronic SI with no plan, AH as above (hearing girl Tanja) and no HI. Became very upset with me towards end of interview and ultimately I had to terminate it.    Psych ROS:  Depression: Could not assess Anxiety:  Could not assess Mania (lifetime and current): Could not assess Psychosis: (lifetime and current): Reports hearing voices that tell her negative things  Collateral information:  D/w floor  nurse -mentation has waxed and waned through the day D/w friend at beside w/ consent - mostly worried about memory. Forgetting things 2-3 minutes after she says them.   Discussed patient with floor nurse.  She reports that this is her first day taking care of the patient, however, between the time the patient was interviewed in the afternoon and first thing this morning patient  had significantly changed.  She reports that the patient was alert and oriented this morning but was just somewhat down from the reported episode of agitation yesterday afternoon.  She reports that patient was still interactive with more people entering her room and then compared to now where she does not want anyone entering the room.  Psychiatric History:  Information collected from patient and chart review  Prev Dx/Sx: Bipolar Disorder, PTSD Current Psych Provider: [name] Home Meds (current): BuSpar 7.5 mg BID, Trileptal 300 mg BID, Seroquel 25 mg BID and 300 mg QHS, Klonopin 0.25 mg BID PRN Previous Med Trials: Zoloft, Risperdal (EPS) Zyprexa, lithium, Lamictal (rash) Wellbutrin (twitching), Cymbalta (insomnia) increase Seroquel (muscle spasm), temazepam, Ambien (sleepwalking) and Depakote  Therapy: Could not assess  Prior ECT: Could Not Assess Prior Psych Hospitalization: Multiple- latest [facility] 12/2022  Prior Self Harm: Reports 2 past Suicide Attempts in her 30's from cutting with a knife Prior Violence: Could Not Assess  Family Psych History: She reports her brother killed his girlfriend.  Reports another brother and a nephew are drug dealers Family Hx suicide: Could not assess  Social History:  Developmental Hx: Could not assess Educational Hx: Could not assess Occupational Hx: Could not assess Legal Hx: Could not assess Living Situation: Could not assess Spiritual Hx: Could not assess Access to weapons: She reports no but not conclusive due to fluctuating mental status  Substance History Tobacco use: Could not assess Alcohol use: Could not assess Drug use: Could not assess   Exam Findings   Psychiatric Specialty Exam:  Presentation  General Appearance: Appropriate for Environment  Eye Contact:-- (none)  Speech:Slow  Speech Volume:Normal  Handedness:Right   Mood and Affect  Mood:Dysphoric  Affect:Congruent   Thought Process  Thought  Processes:Disorganized; Irrevelant  Descriptions of Associations:Tangential  Orientation:Partial  Thought Content:Illogical; Scattered  Hallucinations:Hallucinations: None  Ideas of Reference:None  Suicidal Thoughts:Suicidal Thoughts: Yes, Passive SI Passive Intent and/or Plan: Without Intent; Without Plan  Homicidal Thoughts:Homicidal Thoughts: No   Sensorium  Memory:Immediate Fair  Judgment:Impaired  Insight:Present   Executive Functions  Concentration:Poor  Attention Span:Poor  Recall:Poor  Fund of Knowledge:Poor  Language:Fair   Psychomotor Activity  Psychomotor Activity:Psychomotor Activity: Decreased   Assets  Assets:Desire for Improvement   Sleep  Sleep:Sleep: Fair    Physical Exam: Vital signs:  Temp:  [98.7 F (37.1 C)-99.1 F (37.3 C)] 98.7 F (37.1 C) (12/11 1200) Pulse Rate:  [95-101] 101 (12/11 1200) Resp:  [18] 18 (12/11 1200) BP: (118-128)/(53-69) 118/53 (12/11 1200) SpO2:  [100 %] 100 % (12/11 1200) Physical Exam Constitutional:      General: She is not in acute distress.    Appearance: Normal appearance. She is obese. She is not ill-appearing or toxic-appearing.  HENT:     Head: Normocephalic and atraumatic.  Pulmonary:     Effort: Pulmonary effort is normal.     Blood pressure (!) 118/53, pulse (!) 101, temperature 98.7 F (37.1 C), temperature source Oral, resp. rate 18, height 5\' 4"  (1.626 m), weight 103.4 kg, SpO2 100%. Body mass index is 39.14 kg/m.   Other History  These have been pulled in through the EMR, reviewed, and updated if appropriate.   Family History:   The patient's family history includes ADD / ADHD in her son and another family member; Alcohol abuse in her father; Asthma in her sister; Cirrhosis in her father; Diabetes in her father, maternal grandmother, and other family members; Hypertension in her brother, father, mother, paternal aunt, and sister; Stroke in her father.  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   DISC DISEASE, LUMBAR 01/25/2009   Essential hypertension 01/25/2009   On Bystolic    GAD (generalized anxiety disorder) 12/28/2013   GERD (gastroesophageal reflux disease)    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   Headache, acute 12/20/2013   Hx of laparoscopic gastric banding 09/03/2010   Surgery date: 07/17/10    HYPERLIPIDEMIA 01/25/2009   Hyperlipidemia with target LDL less than 130 01/25/2009   HYPERTENSION 01/25/2009   Hypokalemia, inadequate intake 09/01/2011   Insomnia 04/04/2011   INSOMNIA-SLEEP DISORDER-UNSPEC 04/30/2009   Qualifier: Diagnosis of  By: Jonny Ruiz MD, Len Blalock    Iron deficiency anemia 01/25/2009        Labyrinthitis 02/19/2014   Leiomyoma of uterus, unspecified 08/13/2008   Overview:  Leiomyoma Of The Uterus  10/1 IMO update   Localized edema 06/12/2015   MANIC DEPRESSIVE ILLNESS 01/25/2009   pt is unsue if this is her specifc dx   Mixed bipolar I disorder (HCC) 07/12/2012   Morbid obesity (HCC) 01/25/2009   Night sweats    Osteopenia determined by x-ray 06/13/2021   Other abnormal glucose 10/26/2012   Palpitations 10/25/2013   PEPTIC ULCER DISEASE, HELICOBACTER PYLORI POSITIVE 10/03/2009   Peripheral edema 06/25/2015   Pernicious anemia 03/28/2012   Piriformis syndrome of both sides 09/02/2018   PUD (peptic ulcer disease) 10/03/2009        Right shoulder pain 05/03/2013   Dg Shoulder Right  05/03/2013   CLINICAL DATA Pain.  EXAM RIGHT SHOULDER - 2+ VIEW  COMPARISON None.  FINDINGS Acromioclavicular and glenohumeral degenerative change present. Questionable  calcific density noted in the region of the supraspinatus space, possibly representing calcific supraspinatus tendinitis. This could represent a sclerotic density in the acromion. MRI of the right shoulder suggest   Sleep apnea 08/12/2016   SMOKER 01/25/2009   Qualifier: Diagnosis of  By: Jonny Ruiz MD, Len Blalock    Subacromial bursitis 05/17/2013   SUBSTANCE ABUSE 11/06/2009        Thiamin deficiency 10/30/2012   Tobacco abuse 06/09/2010   Visual disturbance 05/03/2013   Vitamin D deficiency 10/27/2012    Surgical History: Past Surgical History:  Procedure Laterality Date   ABDOMINAL HYSTERECTOMY     BLADDER SURGERY     s/p with ?diverticulitis   HAND TENDON SURGERY  1991   s/p-Right-index and middle   LAPAROSCOPIC GASTRIC BAND REMOVAL WITH LAPAROSCOPIC GASTRIC SLEEVE RESECTION N/A 08/17/2016   Procedure: LAPAROSCOPIC GASTRIC BAND REMOVAL WITH LAPAROSCOPIC GASTRIC SLEEVE RESECTION WITH UPPER ENDO;  Surgeon: Ovidio Kin, MD;  Location: [facility];  Service:  General;  Laterality: N/A;   LAPAROSCOPIC GASTRIC BANDING  07/14/10   SHOULDER ARTHROSCOPY Right 07/21/2013   Procedure: RIGHT ARTHROSCOPY SHOULDER DEBRIDMENT EXTENTSIVE,ARTHROSCOPIC REMOVE LOOSE FOREIGN BODY, BICEPS TENOLYSIS ;  Surgeon: Eulas Post, MD;  Location: [facility];  Service: Orthopedics;  Laterality: Right;   TUBAL LIGATION      Medications:   Current Facility-Administered Medications:    acetaminophen (TYLENOL) tablet 650 mg, 650 mg, Oral, Q6H PRN, 650 mg at 02/02/23 1819 **OR** acetaminophen (TYLENOL) suppository 650 mg, 650 mg, Rectal, Q6H PRN, Julian Reil, Jared M, DO   busPIRone (BUSPAR) tablet 7.5 mg, 7.5 mg, Oral, BID, Hazeline Junker B, MD, 7.5 mg at 02/03/23 0950   calcium carbonate (TUMS - dosed in mg elemental calcium) chewable tablet 200 mg of elemental calcium, 1 tablet, Oral, TID WC, Hazeline Junker B, MD, 200 mg of elemental calcium at 02/03/23 1706   clonazePAM (KLONOPIN) tablet 0.5 mg, 0.5 mg, Oral, BID PRN, Hazeline Junker B, MD, 0.5 mg at 02/03/23 0736   enoxaparin (LOVENOX) injection 40 mg, 40 mg, Subcutaneous, Q24H, Julian Reil, Jared M, DO, 40 mg at 02/03/23 4540   feeding supplement (ENSURE ENLIVE / ENSURE PLUS) liquid 237 mL, 237 mL, Oral, BID BM, Hazeline Junker B, MD, 237 mL at 02/03/23 0953   gabapentin (NEURONTIN) capsule 300 mg, 300 mg, Oral, TID, Tyrone Nine, MD, 300 mg at 02/03/23 1537   hydrochlorothiazide (HYDRODIURIL) tablet 6.25 mg, 6.25 mg, Oral, Daily, Hazeline Junker B, MD, 6.25 mg at 02/03/23 0950   hydrOXYzine (ATARAX) tablet 50 mg, 50 mg, Oral, QHS, Tyrone Nine, MD, 50 mg at 02/02/23 2109   multivitamin with minerals tablet 1 tablet, 1 tablet, Oral, BID WC, Tyrone Nine, MD, 1 tablet at 02/03/23 0736   nicotine polacrilex (NICORETTE) gum 2 mg, 2 mg, Oral, PRN, Luiz Iron, NP   ondansetron [facility]) tablet 4 mg, 4 mg, Oral, Q6H PRN **OR** ondansetron (ZOFRAN) injection 4 mg, 4 mg, Intravenous, Q6H PRN, Julian Reil, Jared M, DO   Oxcarbazepine (TRILEPTAL) tablet 300 mg, 300 mg, Oral, BID, Hazeline Junker B, MD, 300 mg at 02/03/23 0953   pantoprazole (PROTONIX) EC tablet 40 mg, 40 mg, Oral, BID AC, Hazeline Junker B, MD, 40 mg at 02/03/23 1706   pregabalin (LYRICA) capsule 75 mg, 75 mg, Oral, BID, Hazeline Junker B, MD, 75 mg at 02/03/23 0950   QUEtiapine (SEROQUEL) tablet 25 mg, 25 mg, Oral, BID, Hazeline Junker B, MD, 25 mg at 02/03/23 0950   QUEtiapine (SEROQUEL) tablet 300 mg, 300 mg, Oral, QHS, Hazeline Junker B, MD, 300 mg at 02/02/23 2109   simethicone (MYLICON) 40 MG/0.6ML suspension 40 mg, 40 mg, Oral, QID PRN, Tyrone Nine, MD   thiamine (VITAMIN B1) 500 mg in sodium chloride 0.9 % 50 mL IVPB, 500 mg, Intravenous, Q24H, Tyrone Nine, MD, Held at 02/03/23 0737   traMADol (ULTRAM) tablet 50-100 mg, 50-100 mg, Oral, Q6H PRN, Tyrone Nine, MD, 100 mg at 02/03/23 1305  Allergies: Allergies  Allergen Reactions   Pork-Derived Products Other (See Comments) and Anaphylaxis    Religious Reasons (Muslim)    Lamictal [Lamotrigine] Rash

## 2023-02-04 ENCOUNTER — Encounter: Payer: Self-pay | Admitting: Student in an Organized Health Care Education/Training Program

## 2023-02-04 ENCOUNTER — Inpatient Hospital Stay
Admission: AD | Admit: 2023-02-04 | Discharge: 2023-02-10 | DRG: 885 | Disposition: A | Discharge status: Home / Self Care | Payer: Medicare Other | Source: Intra-hospital | Attending: Psychiatry | Admitting: Psychiatry

## 2023-02-04 ENCOUNTER — Other Ambulatory Visit: Payer: Self-pay

## 2023-02-04 DIAGNOSIS — F319 Bipolar disorder, unspecified: Secondary | ICD-10-CM

## 2023-02-04 DIAGNOSIS — F315 Bipolar disorder, current episode depressed, severe, with psychotic features: Secondary | ICD-10-CM | POA: Diagnosis not present

## 2023-02-04 DIAGNOSIS — Z716 Tobacco abuse counseling: Secondary | ICD-10-CM

## 2023-02-04 DIAGNOSIS — Z8711 Personal history of peptic ulcer disease: Secondary | ICD-10-CM

## 2023-02-04 DIAGNOSIS — G5793 Unspecified mononeuropathy of bilateral lower limbs: Secondary | ICD-10-CM | POA: Diagnosis not present

## 2023-02-04 DIAGNOSIS — Z5900 Homelessness unspecified: Secondary | ICD-10-CM

## 2023-02-04 DIAGNOSIS — F17210 Nicotine dependence, cigarettes, uncomplicated: Secondary | ICD-10-CM | POA: Diagnosis not present

## 2023-02-04 DIAGNOSIS — R609 Edema, unspecified: Secondary | ICD-10-CM | POA: Diagnosis not present

## 2023-02-04 DIAGNOSIS — K219 Gastro-esophageal reflux disease without esophagitis: Secondary | ICD-10-CM | POA: Diagnosis present

## 2023-02-04 DIAGNOSIS — Z823 Family history of stroke: Secondary | ICD-10-CM

## 2023-02-04 DIAGNOSIS — Z825 Family history of asthma and other chronic lower respiratory diseases: Secondary | ICD-10-CM

## 2023-02-04 DIAGNOSIS — Z5986 Financial insecurity: Secondary | ICD-10-CM

## 2023-02-04 DIAGNOSIS — R770 Abnormality of albumin: Secondary | ICD-10-CM | POA: Diagnosis not present

## 2023-02-04 DIAGNOSIS — E876 Hypokalemia: Secondary | ICD-10-CM | POA: Diagnosis not present

## 2023-02-04 DIAGNOSIS — R45851 Suicidal ideations: Secondary | ICD-10-CM | POA: Diagnosis present

## 2023-02-04 DIAGNOSIS — Z59811 Housing instability, housed, with risk of homelessness: Secondary | ICD-10-CM | POA: Diagnosis not present

## 2023-02-04 DIAGNOSIS — M6281 Muscle weakness (generalized): Secondary | ICD-10-CM | POA: Diagnosis not present

## 2023-02-04 DIAGNOSIS — F250 Schizoaffective disorder, bipolar type: Secondary | ICD-10-CM | POA: Diagnosis present

## 2023-02-04 DIAGNOSIS — K7460 Unspecified cirrhosis of liver: Secondary | ICD-10-CM | POA: Diagnosis present

## 2023-02-04 DIAGNOSIS — E559 Vitamin D deficiency, unspecified: Secondary | ICD-10-CM | POA: Diagnosis not present

## 2023-02-04 DIAGNOSIS — E785 Hyperlipidemia, unspecified: Secondary | ICD-10-CM | POA: Diagnosis present

## 2023-02-04 DIAGNOSIS — R0602 Shortness of breath: Secondary | ICD-10-CM | POA: Diagnosis not present

## 2023-02-04 DIAGNOSIS — I890 Lymphedema, not elsewhere classified: Secondary | ICD-10-CM | POA: Diagnosis present

## 2023-02-04 DIAGNOSIS — E8809 Other disorders of plasma-protein metabolism, not elsewhere classified: Secondary | ICD-10-CM | POA: Diagnosis present

## 2023-02-04 DIAGNOSIS — Z87892 Personal history of anaphylaxis: Secondary | ICD-10-CM

## 2023-02-04 DIAGNOSIS — R600 Localized edema: Secondary | ICD-10-CM | POA: Diagnosis not present

## 2023-02-04 DIAGNOSIS — R2689 Other abnormalities of gait and mobility: Secondary | ICD-10-CM | POA: Diagnosis not present

## 2023-02-04 DIAGNOSIS — Z9884 Bariatric surgery status: Secondary | ICD-10-CM | POA: Diagnosis not present

## 2023-02-04 DIAGNOSIS — I872 Venous insufficiency (chronic) (peripheral): Secondary | ICD-10-CM | POA: Diagnosis present

## 2023-02-04 DIAGNOSIS — Z9851 Tubal ligation status: Secondary | ICD-10-CM | POA: Diagnosis not present

## 2023-02-04 DIAGNOSIS — M25471 Effusion, right ankle: Secondary | ICD-10-CM | POA: Diagnosis not present

## 2023-02-04 DIAGNOSIS — R2681 Unsteadiness on feet: Secondary | ICD-10-CM | POA: Diagnosis not present

## 2023-02-04 DIAGNOSIS — F411 Generalized anxiety disorder: Secondary | ICD-10-CM | POA: Diagnosis present

## 2023-02-04 DIAGNOSIS — G4700 Insomnia, unspecified: Secondary | ICD-10-CM | POA: Diagnosis present

## 2023-02-04 DIAGNOSIS — M1610 Unilateral primary osteoarthritis, unspecified hip: Secondary | ICD-10-CM | POA: Diagnosis present

## 2023-02-04 DIAGNOSIS — K760 Fatty (change of) liver, not elsewhere classified: Secondary | ICD-10-CM | POA: Diagnosis not present

## 2023-02-04 DIAGNOSIS — I10 Essential (primary) hypertension: Secondary | ICD-10-CM | POA: Diagnosis present

## 2023-02-04 DIAGNOSIS — G894 Chronic pain syndrome: Secondary | ICD-10-CM | POA: Diagnosis present

## 2023-02-04 DIAGNOSIS — Z79899 Other long term (current) drug therapy: Secondary | ICD-10-CM | POA: Diagnosis not present

## 2023-02-04 DIAGNOSIS — Z888 Allergy status to other drugs, medicaments and biological substances status: Secondary | ICD-10-CM

## 2023-02-04 DIAGNOSIS — Z833 Family history of diabetes mellitus: Secondary | ICD-10-CM

## 2023-02-04 DIAGNOSIS — Z6839 Body mass index (BMI) 39.0-39.9, adult: Secondary | ICD-10-CM

## 2023-02-04 DIAGNOSIS — G629 Polyneuropathy, unspecified: Secondary | ICD-10-CM | POA: Diagnosis not present

## 2023-02-04 DIAGNOSIS — M25472 Effusion, left ankle: Secondary | ICD-10-CM | POA: Diagnosis not present

## 2023-02-04 DIAGNOSIS — D649 Anemia, unspecified: Secondary | ICD-10-CM | POA: Diagnosis not present

## 2023-02-04 DIAGNOSIS — Z8249 Family history of ischemic heart disease and other diseases of the circulatory system: Secondary | ICD-10-CM

## 2023-02-04 DIAGNOSIS — Z635 Disruption of family by separation and divorce: Secondary | ICD-10-CM

## 2023-02-04 DIAGNOSIS — F3160 Bipolar disorder, current episode mixed, unspecified: Secondary | ICD-10-CM | POA: Diagnosis not present

## 2023-02-04 DIAGNOSIS — M8580 Other specified disorders of bone density and structure, unspecified site: Secondary | ICD-10-CM | POA: Diagnosis present

## 2023-02-04 DIAGNOSIS — F4310 Post-traumatic stress disorder, unspecified: Secondary | ICD-10-CM | POA: Diagnosis present

## 2023-02-04 DIAGNOSIS — Z560 Unemployment, unspecified: Secondary | ICD-10-CM

## 2023-02-04 LAB — BASIC METABOLIC PANEL
Anion gap: 7 (ref 5–15)
BUN: 8 mg/dL (ref 6–20)
CO2: 32 mmol/L (ref 22–32)
Calcium: 8 mg/dL — ABNORMAL LOW (ref 8.9–10.3)
Chloride: 97 mmol/L — ABNORMAL LOW (ref 98–111)
Creatinine, Ser: 0.83 mg/dL (ref 0.44–1.00)
GFR, Estimated: >60 mL/min (ref >60)
Glucose, Bld: 101 mg/dL — ABNORMAL HIGH (ref 70–99)
Potassium: 3.3 mmol/L — ABNORMAL LOW (ref 3.5–5.1)
Sodium: 136 mmol/L (ref 135–145)

## 2023-02-04 LAB — VITAMIN D 25 HYDROXY (VIT D DEFICIENCY, FRACTURES): Vit D, 25-Hydroxy: 31.72 ng/mL (ref 30–100)

## 2023-02-04 MED ORDER — TRAMADOL HCL 50 MG PO TABS
50.0000 mg | ORAL_TABLET | Freq: Four times a day (QID) | ORAL | Status: DC | PRN
  Administered 2023-02-04 – 2023-02-05 (×3): 100 mg via ORAL
  Filled 2023-02-04 (×3): qty 2

## 2023-02-04 MED ORDER — POTASSIUM CHLORIDE CRYS ER 20 MEQ PO TBCR: 20.0000 meq | EXTENDED_RELEASE_TABLET | Freq: Every day | ORAL | 0 refills | Status: DC

## 2023-02-04 MED ORDER — ALUM & MAG HYDROXIDE-SIMETH 200-200-20 MG/5ML PO SUSP: 30.0000 mL | ORAL | Status: DC | PRN

## 2023-02-04 MED ORDER — QUETIAPINE FUMARATE 25 MG PO TABS
25.0000 mg | ORAL_TABLET | Freq: Two times a day (BID) | ORAL | Status: DC
Start: 2023-02-04 — End: 2023-02-10
  Administered 2023-02-04 – 2023-02-10 (×13): 25 mg via ORAL
  Filled 2023-02-04 (×12): qty 1

## 2023-02-04 MED ORDER — CALCIUM CARBONATE ANTACID 500 MG PO CHEW
1.0000 | CHEWABLE_TABLET | Freq: Three times a day (TID) | ORAL | Status: DC
  Administered 2023-02-05 (×2): 200 mg via ORAL
  Filled 2023-02-04 (×3): qty 1

## 2023-02-04 MED ORDER — ENSURE ENLIVE PO LIQD
237.0000 mL | Freq: Two times a day (BID) | ORAL | Status: DC
  Administered 2023-02-06 – 2023-02-09 (×6): 237 mL via ORAL

## 2023-02-04 MED ORDER — CLONAZEPAM 0.5 MG PO TABS
0.5000 mg | ORAL_TABLET | Freq: Two times a day (BID) | ORAL | Status: DC | PRN
  Administered 2023-02-04 – 2023-02-06 (×3): 0.5 mg via ORAL
  Filled 2023-02-04 (×3): qty 1

## 2023-02-04 MED ORDER — SIMETHICONE 40 MG/0.6ML PO SUSP: 40.0000 mg | Freq: Four times a day (QID) | ORAL | Status: DC | PRN

## 2023-02-04 MED ORDER — GABAPENTIN 300 MG PO CAPS: 300.0000 mg | ORAL_CAPSULE | Freq: Three times a day (TID) | ORAL | 0 refills | Status: DC

## 2023-02-04 MED ORDER — PREGABALIN 75 MG PO CAPS: 75.0000 mg | ORAL_CAPSULE | Freq: Two times a day (BID) | ORAL | 0 refills | Status: DC

## 2023-02-04 MED ORDER — BUSPIRONE HCL 5 MG PO TABS
7.5000 mg | ORAL_TABLET | Freq: Two times a day (BID) | ORAL | Status: DC
  Administered 2023-02-04 – 2023-02-05 (×2): 7.5 mg via ORAL
  Filled 2023-02-04 (×2): qty 2

## 2023-02-04 MED ORDER — HYDROCHLOROTHIAZIDE 12.5 MG PO TABS
6.2500 mg | ORAL_TABLET | Freq: Every day | ORAL | Status: DC
  Administered 2023-02-05 – 2023-02-10 (×6): 6.25 mg via ORAL
  Filled 2023-02-04 (×6): qty 1

## 2023-02-04 MED ORDER — CYANOCOBALAMIN 1000 MCG PO TABS: 1000.0000 ug | ORAL_TABLET | Freq: Every day | ORAL | 0 refills | Status: AC

## 2023-02-04 MED ORDER — OXCARBAZEPINE 300 MG PO TABS
300.0000 mg | ORAL_TABLET | Freq: Two times a day (BID) | ORAL | Status: DC
  Administered 2023-02-04 – 2023-02-10 (×12): 300 mg via ORAL
  Filled 2023-02-04 (×13): qty 1

## 2023-02-04 MED ORDER — PREGABALIN 75 MG PO CAPS
75.0000 mg | ORAL_CAPSULE | Freq: Two times a day (BID) | ORAL | Status: DC
Start: 2023-02-04 — End: 2023-02-05
  Administered 2023-02-04 – 2023-02-05 (×2): 75 mg via ORAL
  Filled 2023-02-04 (×2): qty 1

## 2023-02-04 MED ORDER — POLYETHYLENE GLYCOL 3350 17 G PO PACK
17.0000 g | PACK | Freq: Every day | ORAL | Status: DC
  Administered 2023-02-04: 17 g via ORAL
  Filled 2023-02-04: qty 1

## 2023-02-04 MED ORDER — POLYETHYLENE GLYCOL 3350 17 G PO PACK
17.0000 g | PACK | Freq: Every day | ORAL | Status: DC
  Administered 2023-02-05 – 2023-02-10 (×6): 17 g via ORAL
  Filled 2023-02-04 (×6): qty 1

## 2023-02-04 MED ORDER — ADULT MULTIVITAMIN W/MINERALS CH
1.0000 | ORAL_TABLET | Freq: Two times a day (BID) | ORAL | Status: DC
  Administered 2023-02-04 – 2023-02-10 (×11): 1 via ORAL
  Filled 2023-02-04 (×11): qty 1

## 2023-02-04 MED ORDER — ACETAMINOPHEN 325 MG PO TABS
650.0000 mg | ORAL_TABLET | Freq: Four times a day (QID) | ORAL | Status: DC | PRN
  Filled 2023-02-04: qty 2

## 2023-02-04 MED ORDER — VITAMIN B-12 1000 MCG PO TABS
1000.0000 ug | ORAL_TABLET | Freq: Every day | ORAL | Status: DC
  Administered 2023-02-05 – 2023-02-10 (×6): 1000 ug via ORAL
  Filled 2023-02-04 (×6): qty 1

## 2023-02-04 MED ORDER — DIPHENHYDRAMINE HCL 25 MG PO CAPS
50.0000 mg | ORAL_CAPSULE | Freq: Three times a day (TID) | ORAL | Status: DC | PRN
  Administered 2023-02-05: 50 mg via ORAL
  Filled 2023-02-04: qty 2

## 2023-02-04 MED ORDER — THIAMINE HCL 100 MG PO TABS: 100.0000 mg | ORAL_TABLET | Freq: Every day | ORAL | 0 refills | Status: AC

## 2023-02-04 MED ORDER — GABAPENTIN 300 MG PO CAPS
300.0000 mg | ORAL_CAPSULE | Freq: Three times a day (TID) | ORAL | Status: DC
  Administered 2023-02-04 – 2023-02-05 (×3): 300 mg via ORAL
  Filled 2023-02-04 (×3): qty 1

## 2023-02-04 MED ORDER — HYDROCHLOROTHIAZIDE 12.5 MG PO TABS: 6.2500 mg | ORAL_TABLET | Freq: Every day | ORAL | 0 refills | Status: DC

## 2023-02-04 MED ORDER — HYDROXYZINE HCL 50 MG PO TABS
50.0000 mg | ORAL_TABLET | Freq: Every day | ORAL | Status: DC
  Administered 2023-02-04 – 2023-02-09 (×5): 50 mg via ORAL
  Filled 2023-02-04 (×6): qty 1

## 2023-02-04 MED ORDER — MAGNESIUM HYDROXIDE 400 MG/5ML PO SUSP
30.0000 mL | Freq: Every day | ORAL | Status: DC | PRN
Start: 2023-02-04 — End: 2023-02-10
  Administered 2023-02-04: 30 mL via ORAL
  Filled 2023-02-04: qty 30

## 2023-02-04 MED ORDER — SENNOSIDES-DOCUSATE SODIUM 8.6-50 MG PO TABS
1.0000 | ORAL_TABLET | Freq: Two times a day (BID) | ORAL | Status: DC
  Administered 2023-02-04: 1 via ORAL
  Filled 2023-02-04: qty 1

## 2023-02-04 MED ORDER — HALOPERIDOL 5 MG PO TABS
5.0000 mg | ORAL_TABLET | Freq: Three times a day (TID) | ORAL | Status: DC | PRN
  Administered 2023-02-05: 5 mg via ORAL
  Filled 2023-02-04: qty 1

## 2023-02-04 MED ORDER — NICOTINE POLACRILEX 2 MG MT GUM: 2.0000 mg | CHEWING_GUM | OROMUCOSAL | Status: DC | PRN

## 2023-02-04 MED ORDER — POTASSIUM CHLORIDE CRYS ER 20 MEQ PO TBCR
40.0000 meq | EXTENDED_RELEASE_TABLET | Freq: Two times a day (BID) | ORAL | Status: AC
  Administered 2023-02-04: 40 meq via ORAL
  Filled 2023-02-04: qty 2

## 2023-02-04 MED ORDER — QUETIAPINE FUMARATE 200 MG PO TABS
300.0000 mg | ORAL_TABLET | Freq: Every day | ORAL | Status: DC
  Administered 2023-02-04 – 2023-02-05 (×2): 300 mg via ORAL
  Filled 2023-02-04 (×2): qty 1

## 2023-02-04 MED ORDER — PANTOPRAZOLE SODIUM 40 MG PO TBEC
40.0000 mg | DELAYED_RELEASE_TABLET | Freq: Two times a day (BID) | ORAL | Status: DC
  Administered 2023-02-04 – 2023-02-10 (×12): 40 mg via ORAL
  Filled 2023-02-04 (×12): qty 1

## 2023-02-04 MED ORDER — VITAMIN B-12 1000 MCG PO TABS
1000.0000 ug | ORAL_TABLET | Freq: Every day | ORAL | Status: DC
  Administered 2023-02-04: 1000 ug via ORAL
  Filled 2023-02-04: qty 1

## 2023-02-04 MED ORDER — POTASSIUM CHLORIDE CRYS ER 20 MEQ PO TBCR
40.0000 meq | EXTENDED_RELEASE_TABLET | Freq: Two times a day (BID) | ORAL | Status: DC
  Administered 2023-02-04: 40 meq via ORAL
  Filled 2023-02-04: qty 2

## 2023-02-04 NOTE — Progress Notes (Signed)
   02/04/23 1300  Spiritual Encounters  Type of Visit Initial  Reason for visit Urgent spiritual support  Spiritual Framework  Presenting Themes Meaning/purpose/sources of inspiration  Spiritual Care Plan  Spiritual Care Issues Still Outstanding Referring to oncoming chaplain for further support   Return phone call to person that left message. They were requesting clothes for a patient that is going to be discharge. I let them know I am not sure what the process is and will let the active Chaplain here know the situation.

## 2023-02-04 NOTE — Consult Note (Addendum)
[name] Consult Evaluation  Service Date: February 04, 2023 LOS:  LOS: 0 days    Primary Psychiatric Diagnoses  Bipolar Disorder 2.  PTSD 3.  Acute Delirium vs Wernicke due to malabsorption  Assessment  Shannon [name] is a 57 y.o. female admitted medically for 01/27/2023  9:06 PM for bilateral edema of her legs and hypotension/lightheadedness. She carries the psychiatric diagnoses of Bipolar Disorder and PTSD, and 2 Suicide Attempts (in 30's- cut self with knife) and multiple Psychiatric Hospitalizations (latest- [name] 12/2022) and has a past medical history of CPS, peripheral neuropathy, HTN, morbid obesity s/p sleeve gastrectomy . Psychiatry was consulted for mood disorder, feels like she's in "crisis" and endorsing SI by Alycia Rossetti B. Jarvis Newcomer MD.  Patient's initial presentation of confusion, disorganization, and possible confabulation was most consistent with acute delirium versus Wernicke's encephalopathy secondary to malabsorption post-gastric sleeve surgery. Overall, improving on scheduled thiamine. Initially suspected SI statements were made in the context of delirium but this has continued despite improvements in cognition.   Please refer to the original note for a complete initial assessment.  Outpatient psychotropic medications include Seroquel, Trileptal, hydroxyzine, and BuSpar, with a history of stability on these medications.  12/11: Sitter ordered by primary team (pt endorsed SI this AM). When I saw, she was endorsing passive SI and danced around a plan. Was still fairly confused (clarified a few points 4-5 times) however did not note confabulation, so likely some benefit from thiamine. Friend at bedside mostly worried about attention/confusion more so than suicidality   12/12: Patient continues endorsing active SI with plan to jump off a building and ongoing command hallucinations to self-harm. Confusion not as apparent today than previously documented, answered  most questions directly despite being ruminative and irritable. Subjectively reports responding well to haldol in the past for management of her hallucinations, she is already on seroquel and addition of FGA would not be unreasonable if her hallucinations continue driving her SI. Prior hx of attempts  places her at elevated risk of repeat attempt. Will move forward with recommending psychiatric inpatient admission, continuing 1:1 today as she failed to contract for safety.     Diagnoses:  Active Hospital problems: Principal Problem:   Peripheral edema Active Problems:   Essential hypertension   Hypokalemia   Mixed bipolar I disorder (HCC)   Housing insecurity   Hypoalbuminemia   Anemia     Plan   ## Psychiatric Medication Recommendations:  --  Thiamine supplementation 200 mg 3 times daily -- c buspirone 7.5 BID -- c oxcarb 300 BID -- c quetiapine 25 BID and 300 QHS   ## Medical Decision Making Capacity:  Not formally assessed  ## Further Work-up:  -- Could consider Vitamin levels- Thiamine and Vit D.   -- most recent EKG on 01/27/2023 had QtC of 492 -- Pertinent labwork reviewed earlier this admission includes: CMP on admission showed K: 2.6,  Ca: 8.3,  Albumin: 2.5,  Mag= 2.0, Folate: 6.7,  B12: 316,   ## Disposition:  -- Recommending inpatient psychiatric admission once medically cleared. Confusion and confabulation are not appreciated on exam with ongoing admission of active SI with stated plan and intent to jump off building as well as command hallucinations to self harm.    ## Behavioral / Environmental:  --  Delirium recs 1. Avoid benzodiazepines, antihistamines, anticholinergics, and minimize opiate use as these may worsen delirium. 2: Assess, prevent and manage pain as lack of treatment can result in delirium.  3:  Provide appropriate lighting and clear signage; a clock and calendar should be easily visible to the patient. 4: Monitor environmental factors. Reduce  light and noise at night (close shades, turn off lights, turn off TV, ect). Correct any alterations in sleep cycle. 5: Reorient the patient to person, place, time and situation on each encounter.  6: Correct sensory deficits if possible (replace eye glasses, hearing aids, ect). 7: Avoid restraints if able. Severely delirious patients benefit from constant observation by a sitter.   ## Safety and Observation Level:  - Based on my clinical evaluation, I estimate the patient to be at elevated risk of self harm in the current setting -Continue 1:1 sitter  Suicide risk assessment  Patient does not current appear to have any modifiable risk factors for suicide.  Patient has following non-modifiable or demographic risk factors for suicide: psychiatric hospitalization  Patient has the following protective factors against suicide: Access to outpatient mental health care, Supportive family, and Supportive friends   Thank you for this consult request. Recommendations have been communicated to the primary team.  We will continue to follow at this time.   Lorri Frederick, MD  Psychiatric and Social History   Relevant Aspects of Hospital Course:  Admitted on 01/27/2023 for Hypokalemia, Lower Extremity, and Weakness.  Her potassium was repleted.  Changes to her blood pressure medications were made.  Nutrition was consulted to discuss deficiencies in the setting of gastric surgery.   Patient Report:  The patient was initially agreeable to speaking but later expressed reluctance to engage, stating she "doesn't want to be bothered." She appeared ruminative about being seen by a female provider the other day, reiterating her preference for female providers.  The patient reports that Buspar and Atarax have addressed some of her anxiety. She admits to experience command auditory hallucinations telling her to kill herself, to cut herself and that she is worthless. When assessing for SI, the patient  states "I'm gonna jump off a building tomorrow," acknowledging frustration and confirming intent to explore other methods of ending her life. She failed to contract for safety despite multiple attempts to engage patient in discussion of safety planning. She describes herself as having "cooky bird moments" and alludes to two alter egos named Debbra Riding and Dahlia Byes, noting that Dahlia Byes "will straighten you out if you keep asking questions."   She shares she has been dealing with housing instability, often staying at hotels, and reports significant financial instability. She has been on SSI since 1998 due to bipolar disorder but states that her income is insufficient to cover rent. She reports visiting [place] between Aug- Sept to visit a family friend before making an emergency flight back to the [place] she describes as "the [place]."    Psych ROS on initial evaluation:  Depression: Could not assess Anxiety:  Could not assess Mania (lifetime and current): Could not assess Psychosis: (lifetime and current): Reports hearing voices that tell her negative things  Collateral information:  D/w floor nurse -mentation has waxed and waned through the day D/w friend at beside w/ consent - mostly worried about memory. Forgetting things 2-3 minutes after she says them.   Discussed patient with floor nurse.  She reports that this is her first day taking care of the patient, however, between the time the patient was interviewed in the afternoon and first thing this morning patient had significantly changed.  She reports that the patient was alert and oriented this morning but was just somewhat down from the  reported episode of agitation yesterday afternoon.  She reports that patient was still interactive with more people entering her room and then compared to now where she does not want anyone entering the room.  Psychiatric History:  Information collected from patient and chart review  Prev Dx/Sx: Bipolar  Disorder, PTSD Current Psych Provider: [name] Home Meds (current): BuSpar 7.5 mg BID, Trileptal 300 mg BID, Seroquel 25 mg BID and 300 mg QHS, Klonopin 0.25 mg BID PRN Previous Med Trials: Zoloft, Risperdal (EPS) Zyprexa, lithium, Lamictal (rash) Wellbutrin (twitching), Cymbalta (insomnia) increase Seroquel (muscle spasm), temazepam, Ambien (sleepwalking) and Depakote  Therapy: Could not assess  Prior ECT: Could Not Assess Prior Psych Hospitalization: Multiple- latest [facility] 12/2022  Prior Self Harm: Reports 2 past Suicide Attempts in her 30's from cutting with a knife Prior Violence: Could Not Assess  Family Psych History: She reports her brother killed his girlfriend.  Reports another brother and a nephew are drug dealers Family Hx suicide: Could not assess  Social History:  Developmental Hx: Could not assess Educational Hx: Could not assess Occupational Hx: Could not assess Legal Hx: Could not assess Living Situation: Could not assess Spiritual Hx: Could not assess Access to weapons: She reports no but not conclusive due to fluctuating mental status  Substance History Tobacco use: Could not assess Alcohol use: Could not assess Drug use: Could not assess   Exam Findings   Psychiatric Specialty Exam:   Presentation  General Appearance: Appropriate for Environment  Eye Contact:-- (none)  Speech:Normal  Speech Volume:Normal  Handedness:Not assessed   Mood and Affect  Mood:Irritable  Affect:Congruent   Thought Process  Thought Processes:More organized  Descriptions of Associations:Ruminative  Orientation:Partial  Thought Content:Illogical; Scattered  Hallucinations:Auditory hallucinations, command: self-harm  Ideas of Reference:None  Suicidal Thoughts:Yes, active with plan and intent  Homicidal Thoughts:Homicidal Thoughts: No   Sensorium  Memory:Immediate Fair  Judgment:Impaired  Insight:Shallow, limited   Executive Functions   Concentration:Poor  Attention Span:Poor  Recall:Poor  Fund of Knowledge:Poor  Language:Fair   Psychomotor Activity  Psychomotor Activity:Normal   Assets  Assets:resilience; social support   Sleep  Sleep:Sleep: Fair    Physical Exam: Vital signs:  Temp:  [98.3 F (36.8 C)-98.7 F (37.1 C)] 98.5 F (36.9 C) (12/12 0629) Pulse Rate:  [90-101] 90 (12/12 0629) Resp:  [16-18] 16 (12/11 2045) BP: (118-128)/(53-74) 128/74 (12/12 0629) SpO2:  [92 %-100 %] 95 % (12/12 0629) Physical Exam Constitutional:      General: She is not in acute distress.    Appearance: Normal appearance. She is obese. She is not ill-appearing or toxic-appearing.  HENT:     Head: Normocephalic and atraumatic.  Pulmonary:     Effort: Pulmonary effort is normal.     Blood pressure 128/74, pulse 90, temperature 98.5 F (36.9 C), temperature source Oral, resp. rate 16, height 5\' 4"  (1.626 m), weight 103.4 kg, SpO2 95%. Body mass index is 39.14 kg/m.   Other History   These have been pulled in through the EMR, reviewed, and updated if appropriate.   Family History:   The patient's family history includes ADD / ADHD in her son and another family member; Alcohol abuse in her father; Asthma in her sister; Cirrhosis in her father; Diabetes in her father, maternal grandmother, and other family members; Hypertension in her brother, father, mother, paternal aunt, and sister; Stroke in her father.  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   DISC DISEASE, LUMBAR 01/25/2009   Essential  hypertension 01/25/2009   On Bystolic    GAD (generalized anxiety disorder) 12/28/2013   GERD (gastroesophageal reflux disease)    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   Headache, acute 12/20/2013   Hx of laparoscopic gastric banding 09/03/2010   Surgery date: 07/17/10    HYPERLIPIDEMIA 01/25/2009   Hyperlipidemia with target LDL less than 130 01/25/2009   HYPERTENSION 01/25/2009   Hypokalemia, inadequate intake 09/01/2011   Insomnia 04/04/2011   INSOMNIA-SLEEP DISORDER-UNSPEC 04/30/2009   Qualifier: Diagnosis of  By: Jonny Ruiz MD, Len Blalock    Iron deficiency anemia 01/25/2009        Labyrinthitis 02/19/2014   Leiomyoma of uterus, unspecified 08/13/2008   Overview:  Leiomyoma Of The Uterus  10/1 IMO update   Localized edema 06/12/2015   MANIC DEPRESSIVE ILLNESS 01/25/2009   pt is unsue if this is her specifc dx   Mixed bipolar I disorder (HCC) 07/12/2012   Morbid obesity (HCC) 01/25/2009   Night sweats    Osteopenia determined by x-ray 06/13/2021   Other abnormal glucose 10/26/2012   Palpitations 10/25/2013   PEPTIC ULCER DISEASE, HELICOBACTER PYLORI POSITIVE 10/03/2009   Peripheral edema 06/25/2015   Pernicious anemia 03/28/2012   Piriformis syndrome of both sides 09/02/2018   PUD (peptic ulcer disease) 10/03/2009        Right shoulder pain 05/03/2013   Dg Shoulder Right  05/03/2013   CLINICAL DATA Pain.  EXAM RIGHT SHOULDER - 2+ VIEW  COMPARISON None.  FINDINGS Acromioclavicular and glenohumeral degenerative change present. Questionable calcific density noted in the region of the supraspinatus space, possibly representing calcific supraspinatus tendinitis. This could represent a sclerotic density in the acromion. MRI of the right shoulder suggest   Sleep apnea 08/12/2016   SMOKER 01/25/2009   Qualifier: Diagnosis of  By: Jonny Ruiz MD, Len Blalock    Subacromial bursitis 05/17/2013   SUBSTANCE ABUSE 11/06/2009        Thiamin deficiency 10/30/2012   Tobacco abuse 06/09/2010   Visual disturbance 05/03/2013   Vitamin D deficiency  10/27/2012    Surgical History: Past Surgical History:  Procedure Laterality Date   ABDOMINAL HYSTERECTOMY     BLADDER SURGERY     s/p with ?diverticulitis   HAND TENDON SURGERY  1991   s/p-Right-index and middle   LAPAROSCOPIC GASTRIC BAND REMOVAL WITH LAPAROSCOPIC GASTRIC SLEEVE RESECTION N/A 08/17/2016   Procedure: LAPAROSCOPIC GASTRIC BAND REMOVAL WITH LAPAROSCOPIC GASTRIC SLEEVE RESECTION WITH UPPER ENDO;  Surgeon: Ovidio Kin, MD;  Location: [facility];  Service: General;  Laterality: N/A;   LAPAROSCOPIC GASTRIC BANDING  07/14/10   SHOULDER ARTHROSCOPY Right 07/21/2013   Procedure: RIGHT ARTHROSCOPY SHOULDER DEBRIDMENT EXTENTSIVE,ARTHROSCOPIC REMOVE LOOSE FOREIGN BODY, BICEPS TENOLYSIS ;  Surgeon: Eulas Post, MD;  Location: [facility];  Service: Orthopedics;  Laterality: Right;   TUBAL LIGATION      Medications:   Current Facility-Administered Medications:    acetaminophen (TYLENOL) tablet 650 mg, 650 mg, Oral, Q6H PRN, 650 mg at 02/02/23 1819 **OR** acetaminophen (TYLENOL) suppository 650 mg, 650 mg, Rectal, Q6H PRN, Hillary Bow,  DO   busPIRone (BUSPAR) tablet 7.5 mg, 7.5 mg, Oral, BID, Tyrone Nine, MD, 7.5 mg at 02/03/23 2053   calcium carbonate (TUMS - dosed in mg elemental calcium) chewable tablet 200 mg of elemental calcium, 1 tablet, Oral, TID WC, Hazeline Junker B, MD, 200 mg of elemental calcium at 02/03/23 1706   clonazePAM (KLONOPIN) tablet 0.5 mg, 0.5 mg, Oral, BID PRN, Tyrone Nine, MD, 0.5 mg at 02/04/23 2956   cyanocobalamin (VITAMIN B12) tablet 1,000 mcg, 1,000 mcg, Oral, Daily, Regalado, Belkys A, MD   enoxaparin (LOVENOX) injection 40 mg, 40 mg, Subcutaneous, Q24H, Julian Reil, Jared M, DO, 40 mg at 02/03/23 2130   feeding supplement (ENSURE ENLIVE / ENSURE PLUS) liquid 237 mL, 237 mL, Oral, BID BM, Tyrone Nine, MD, 237 mL at 02/03/23 0953   gabapentin (NEURONTIN) capsule 300 mg, 300 mg, Oral, TID, Tyrone Nine, MD, 300 mg at 02/03/23 2053    hydrochlorothiazide (HYDRODIURIL) tablet 6.25 mg, 6.25 mg, Oral, Daily, Hazeline Junker B, MD, 6.25 mg at 02/03/23 0950   hydrOXYzine (ATARAX) tablet 50 mg, 50 mg, Oral, QHS, Tyrone Nine, MD, 50 mg at 02/03/23 2053   multivitamin with minerals tablet 1 tablet, 1 tablet, Oral, BID WC, Tyrone Nine, MD, 1 tablet at 02/03/23 2053   nicotine polacrilex (NICORETTE) gum 2 mg, 2 mg, Oral, PRN, Luiz Iron, NP   ondansetron [facility]) tablet 4 mg, 4 mg, Oral, Q6H PRN **OR** ondansetron (ZOFRAN) injection 4 mg, 4 mg, Intravenous, Q6H PRN, Julian Reil, Jared M, DO   Oxcarbazepine (TRILEPTAL) tablet 300 mg, 300 mg, Oral, BID, Hazeline Junker B, MD, 300 mg at 02/03/23 2053   pantoprazole (PROTONIX) EC tablet 40 mg, 40 mg, Oral, BID AC, Hazeline Junker B, MD, 40 mg at 02/03/23 1706   polyethylene glycol (MIRALAX / GLYCOLAX) packet 17 g, 17 g, Oral, Daily, Regalado, Belkys A, MD   potassium chloride SA (KLOR-CON M) CR tablet 40 mEq, 40 mEq, Oral, BID, Regalado, Belkys A, MD   pregabalin (LYRICA) capsule 75 mg, 75 mg, Oral, BID, Hazeline Junker B, MD, 75 mg at 02/03/23 2053   QUEtiapine (SEROQUEL) tablet 25 mg, 25 mg, Oral, BID, Hazeline Junker B, MD, 25 mg at 02/03/23 2054   QUEtiapine (SEROQUEL) tablet 300 mg, 300 mg, Oral, QHS, Hazeline Junker B, MD, 300 mg at 02/03/23 2053   simethicone (MYLICON) 40 MG/0.6ML suspension 40 mg, 40 mg, Oral, QID PRN, Tyrone Nine, MD   thiamine (VITAMIN B1) 500 mg in sodium chloride 0.9 % 50 mL IVPB, 500 mg, Intravenous, Q24H, Tyrone Nine, MD, Held at 02/03/23 0737   traMADol (ULTRAM) tablet 50-100 mg, 50-100 mg, Oral, Q6H PRN, Tyrone Nine, MD, 100 mg at 02/04/23 8657  Allergies: Allergies  Allergen Reactions   Pork-Derived Products Other (See Comments) and Anaphylaxis    Religious Reasons (Muslim)   Lamictal [Lamotrigine] Rash

## 2023-02-04 NOTE — TOC Transition Note (Addendum)
Transition of Care [facility]) - Discharge Note   Patient Details  Name: Shannon [name] MRN: [id number] Date of Birth: 1966/02/08  Transition of Care [facility]) CM/SW Contact:  Lanier Clam, RN Phone Number: 02/04/2023, 12:01 PM   Clinical Narrative:   D/c [facility] inpt psych..Will arrange voluntary safe transport once ready. 3p-[facility] inpt Psych accepted-Dr. Herrick;report tel#[phone], [phone]. Safe transport rep Alona Bene called. Provided nsg tel# for pick up. Voluntary form faxed w/confirmation to [facility] rep Demetria. Safe transport ride waiver, & face sheet in packet.    Final next level of care: Psychiatric Hospital Barriers to Discharge: No Barriers Identified   Patient Goals and CMS Choice Patient states their goals for this hospitalization and ongoing recovery are:: Rehab CMS Medicare.gov Compare Post Acute Care list provided to:: Patient Choice offered to / list presented to : Patient [place] ownership interest in [facility].provided to:: Patient    Discharge Placement                       Discharge Plan and Services Additional resources added to the After Visit Summary for     Discharge Planning Services: CM Consult Post Acute Care Choice: Skilled Nursing Facility                               Social Drivers of Health (SDOH) Interventions SDOH Screenings   Food Insecurity: Food Insecurity Present (01/28/2023)  Housing: Patient Declined (01/01/2023)  Transportation Needs: Patient Declined (01/01/2023)  Utilities: Not At Risk (08/29/2022)  Alcohol Screen: Low Risk  (10/07/2021)  Depression (PHQ2-9): High Risk (01/01/2023)  Financial Resource Strain: High Risk (01/01/2023)  Physical Activity: Inactive (01/01/2023)  Social Connections: Socially Isolated (01/01/2023)  Stress: Stress Concern Present (01/01/2023)  Tobacco Use: High Risk (01/27/2023)     Readmission Risk Interventions     No data to display

## 2023-02-04 NOTE — Group Note (Signed)
Date:  02/04/2023 Time:  8:58 PM  Group Topic/Focus:  Primary and Secondary Emotions:   The focus of this group is to discuss the difference between primary and secondary emotions.    Participation Level:  Active  Participation Quality:  Appropriate and Attentive  Affect:  Appropriate  Cognitive:  Appropriate  Insight: Appropriate  Engagement in Group:  Improving  Modes of Intervention:  Discussion, Rapport Building, and Support  Additional Comments:     Jessup Ogas 02/04/2023, 8:58 PM

## 2023-02-04 NOTE — Discharge Summary (Signed)
Physician Discharge Summary   Patient: Shannon [name] MRN: [id number] DOB: [birth year]  Admit date:     01/27/2023  Discharge date: 02/04/23  Discharge Physician: Alba Cory   PCP: Avanell Shackleton, NP-C   Recommendations at discharge:    Transfer to [facility] inpatient psych facility   Discharge Diagnoses: Principal Problem:   Peripheral edema Active Problems:   Hypokalemia   Housing insecurity   Hypoalbuminemia   Anemia   Essential hypertension   Mixed bipolar I disorder (HCC)  Resolved Problems:   * No resolved hospital problems. [facility] Course: 57 year old with past medical history significant for CPS, peripheral neuropathy, bipolar 1 disorder, hypertension, morbid obesity, status post sleeve gastrectomy presents to the ED 12/4 with weakness and dependent edema that did not improve with Lasix.  Workup finding include hypokalemia hypoalbuminemia.  BNP 49, hemoglobin 9.6, D-dimer 0.4.  She was admitted due to weakness, we have been pursuing skilled nursing facility but she has been able to ambulate with physical therapy.  So does not qualify for skilled nursing facility.   On 12/9 she developed significant recurrence of psychiatric symptoms for which psychiatric has been consulted, she reports suicidal ideation  Assessment and Plan: 1-Edema, multifactorial: Could be related to venous insufficiency, hypoalbuminemia.  Echo showed moderate AI, normal biventricular function. -Monitor off  diuretics -Elevation of leg, applying compression stocking -Continue hydrochlorothiazide   Weakness: PT initially recommending SNF, now patient has been cleared by PT   Hepatic steatosis: Need weight loss   Morbid obesity: Status post band which was removed and sleeve gastrectomy performed.  Weight loss   Hypokalemia: replete. Start oral supplement while on HCTZ   Normocytic anemia: Hemoglobin is stable.   Neuropathy, chronic continue gabapentin Lyrica low-dose added   Bipolar  disorder, Depression. Suicidal thought Symptoms have worse, reports suicidal thoughts.  Psych consulted  On Seroquel Patient has been accepted to inpatient psych facility.  Psych recommend inpatient treatment.   GERD: Continue PPI      stable for transfer inpatient psych facility          Consultants: psych  Procedures performed: None Disposition:  inpatient psych facility  Diet recommendation:  Discharge Diet Orders (From admission, onward)     Start     Ordered   02/04/23 0000  Diet - low sodium heart healthy        02/04/23 1215           Cardiac diet DISCHARGE MEDICATION: Allergies as of 02/04/2023       Reactions   Pork-derived Products Other (See Comments), Anaphylaxis   Religious Reasons (Muslim)   Lamictal [lamotrigine] Rash        Medication List     STOP taking these medications    amLODipine 5 MG tablet Commonly known as: NORVASC       TAKE these medications    atorvastatin 20 MG tablet Commonly known as: LIPITOR TAKE ONE TABLET BY MOUTH ONCE DAILY FOR CHOLESTEROL What changed: See the new instructions.   Bariatric Multivitamins/Iron Caps Take 1 capsule by mouth daily.   busPIRone 7.5 MG tablet Commonly known as: BUSPAR Take 7.5 mg by mouth 2 (two) times daily.   clonazePAM 0.5 MG tablet Commonly known as: KLONOPIN Take 1 tablet (0.5 mg total) by mouth 2 (two) times daily as needed for anxiety.   cyanocobalamin 1000 MCG tablet Take 1 tablet (1,000 mcg total) by mouth daily. Start taking on: February 05, 2023   gabapentin 300 MG capsule  Commonly known as: NEURONTIN Take 1 capsule (300 mg total) by mouth 3 (three) times daily. What changed: when to take this   hydrochlorothiazide 12.5 MG tablet Commonly known as: HYDRODIURIL Take 0.5 tablets (6.25 mg total) by mouth daily. Start taking on: February 05, 2023   hydrOXYzine 50 MG tablet Commonly known as: ATARAX Take 50 mg by mouth at bedtime.   Oxcarbazepine 300 MG  tablet Commonly known as: TRILEPTAL Take 300 mg by mouth 2 (two) times daily.   pantoprazole 40 MG tablet Commonly known as: PROTONIX Take 1 tablet (40 mg total) by mouth 2 (two) times daily before a meal.   potassium chloride SA 20 MEQ tablet Commonly known as: KLOR-CON M Take 1 tablet (20 mEq total) by mouth daily.   pregabalin 75 MG capsule Commonly known as: LYRICA Take 1 capsule (75 mg total) by mouth 2 (two) times daily.   QUEtiapine 25 MG tablet Commonly known as: SEROQUEL Take 1 tablet (25 mg total) by mouth 2 (two) times daily.   QUEtiapine 300 MG tablet Commonly known as: SEROQUEL Take 1 tablet (300 mg total) by mouth at bedtime.   thiamine 100 MG tablet Commonly known as: VITAMIN B1 Take 1 tablet (100 mg total) by mouth daily.        Discharge Exam: Filed Weights   01/28/23 1214  Weight: 103.4 kg   General; NAD  Condition at discharge: stable  The results of significant diagnostics from this hospitalization (including imaging, microbiology, ancillary and laboratory) are listed below for reference.   Imaging Studies: ECHOCARDIOGRAM COMPLETE Result Date: 01/29/2023    ECHOCARDIOGRAM REPORT   Patient Name:   Shannon [name] Date of Exam: 01/29/2023 Medical Rec #:  [id number]        Height:       64.0 in Accession #:    [id number]       Weight:       228.0 lb Date of Birth:  [birth year]        BSA:          2.068 m Patient Age:    57 years         BP:           124/50 mmHg Patient Gender: F                HR:           97 bpm. Exam Location:  Inpatient Procedure: 2D Echo, Cardiac Doppler and Color Doppler Indications:    I50.40* Unspecified combined systolic (congestive) and diastolic                 (congestive) heart failure  History:        Patient has prior history of Echocardiogram examinations, most                 recent 07/12/2015. Abnormal ECG, Arrythmias:Tachycardia,                 Signs/Symptoms:Edema, Dizziness/Lightheadedness and Altered                  Mental Status; Risk Factors:Hypertension, Dyslipidemia, Current                 Smoker and Sleep Apnea.  Sonographer:    Sheralyn Boatman RDCS Referring Phys: [phone] JARED M GARDNER  Sonographer Comments: Patient agitatied because staff would not leave door closed. Tech came in and interrupted echo to introduce herself to patient and called her by incorrect  last name. Tech then came back to interrupt again to leave linens. IMPRESSIONS  1. Left ventricular ejection fraction, by estimation, is 60 to 65%. The left ventricle has normal function. The left ventricle has no regional wall motion abnormalities. Left ventricular diastolic parameters are consistent with Grade I diastolic dysfunction (impaired relaxation).  2. Right ventricular systolic function is normal. The right ventricular size is normal. There is normal pulmonary artery systolic pressure. The estimated right ventricular systolic pressure is 29.8 mmHg.  3. The mitral valve was not well visualized. Mild mitral valve regurgitation.  4. There is mild calcification of the aortic valve. Aortic valve regurgitation is moderate. Aortic regurgitation PHT measures 337 msec.  5. The inferior vena cava is normal in size with greater than 50% respiratory variability, suggesting right atrial pressure of 3 mmHg. Conclusion(s)/Recommendation(s): AI appears at least moderate; poor acoustic windows. FINDINGS  Left Ventricle: Left ventricular ejection fraction, by estimation, is 60 to 65%. The left ventricle has normal function. The left ventricle has no regional wall motion abnormalities. The left ventricular internal cavity size was normal in size. There is  no left ventricular hypertrophy. Left ventricular diastolic parameters are consistent with Grade I diastolic dysfunction (impaired relaxation). Right Ventricle: The right ventricular size is normal. Right ventricular systolic function is normal. There is normal pulmonary artery systolic pressure. The tricuspid regurgitant  velocity is 2.59 m/s, and with an assumed right atrial pressure of 3 mmHg,  the estimated right ventricular systolic pressure is 29.8 mmHg. Left Atrium: Left atrial size was normal in size. Right Atrium: Right atrial size was normal in size. Pericardium: There is no evidence of pericardial effusion. Mitral Valve: The mitral valve was not well visualized. Mild mitral valve regurgitation. MV peak gradient, 13.7 mmHg. The mean mitral valve gradient is 5.0 mmHg. Tricuspid Valve: Tricuspid valve regurgitation is mild. Aortic Valve: There is mild calcification of the aortic valve. Aortic valve regurgitation is moderate. Aortic regurgitation PHT measures 337 msec. Aortic valve mean gradient measures 10.0 mmHg. Aortic valve peak gradient measures 17.3 mmHg. Aortic valve area, by VTI measures 2.45 cm. Pulmonic Valve: Pulmonic valve regurgitation is not visualized. Aorta: The aortic root and ascending aorta are structurally normal, with no evidence of dilitation. Venous: The inferior vena cava is normal in size with greater than 50% respiratory variability, suggesting right atrial pressure of 3 mmHg. IAS/Shunts: No atrial level shunt detected by color flow Doppler.  LEFT VENTRICLE PLAX 2D LVIDd:         4.50 cm     Diastology LVIDs:         2.80 cm     LV e' medial:    8.70 cm/s LV PW:         0.90 cm     LV E/e' medial:  13.4 LV IVS:        0.80 cm     LV e' lateral:   9.36 cm/s LVOT diam:     2.00 cm     LV E/e' lateral: 12.4 LV SV:         86 LV SV Index:   42 LVOT Area:     3.14 cm  LV Volumes (MOD) LV vol d, MOD A2C: 63.3 ml LV vol d, MOD A4C: 64.5 ml LV vol s, MOD A2C: 13.6 ml LV vol s, MOD A4C: 18.5 ml LV SV MOD A2C:     49.7 ml LV SV MOD A4C:     64.5 ml LV SV MOD BP:  50.0 ml RIGHT VENTRICLE             IVC RV S prime:     14.00 cm/s  IVC diam: 1.90 cm TAPSE (M-mode): 2.5 cm LEFT ATRIUM             Index        RIGHT ATRIUM           Index LA diam:        3.20 cm 1.55 cm/m   RA Area:     11.90 cm LA Vol  (A2C):   32.7 ml 15.81 ml/m  RA Volume:   25.60 ml  12.38 ml/m LA Vol (A4C):   43.4 ml 20.98 ml/m LA Biplane Vol: 38.0 ml 18.37 ml/m  AORTIC VALVE AV Area (Vmax):    2.04 cm AV Area (Vmean):   2.14 cm AV Area (VTI):     2.45 cm AV Vmax:           208.00 cm/s AV Vmean:          145.000 cm/s AV VTI:            0.351 m AV Peak Grad:      17.3 mmHg AV Mean Grad:      10.0 mmHg LVOT Vmax:         135.00 cm/s LVOT Vmean:        98.600 cm/s LVOT VTI:          0.274 m LVOT/AV VTI ratio: 0.78 AI PHT:            337 msec  AORTA Ao Root diam: 2.80 cm Ao Asc diam:  3.10 cm MITRAL VALVE                TRICUSPID VALVE MV Area (PHT): 4.89 cm     TR Peak grad:   26.8 mmHg MV Area VTI:   2.67 cm     TR Vmax:        259.00 cm/s MV Peak grad:  13.7 mmHg MV Mean grad:  5.0 mmHg     SHUNTS MV Vmax:       1.85 m/s     Systemic VTI:  0.27 m MV Vmean:      108.0 cm/s   Systemic Diam: 2.00 cm MV Decel Time: 155 msec MV E velocity: 116.50 cm/s MV A velocity: 165.00 cm/s MV E/A ratio:  0.71 [place] signed by Carolan Clines Signature Date/Time: 01/29/2023/9:16:58 AM    Final    US ABDOMEN RUQ W/ELASTOGRAPHY Result Date: 01/28/2023 CLINICAL DATA:  Fatty liver.  Hypoalbuminemia. EXAM: US ABDOMEN LIMITED - RIGHT UPPER QUADRANT ULTRASOUND HEPATIC ELASTOGRAPHY TECHNIQUE: Sonography of the right upper quadrant was performed. In addition, ultrasound elastography evaluation of the liver was performed. A region of interest was placed within the right lobe of the liver. Following application of a compressive sonographic pulse, tissue compressibility was assessed. Multiple assessments were performed at the selected site. Median tissue compressibility was determined. Previously, hepatic stiffness was assessed by shear wave velocity. Based on recently published Society of Radiologists in Ultrasound consensus article, reporting is now recommended to be performed in the SI units of pressure (kiloPascals) representing hepatic  stiffness/elasticity. The obtained result is compared to the published reference standards. (cACLD = compensated Advanced Chronic Liver Disease) COMPARISON:  CT abdomen and pelvis 08/28/2022 FINDINGS: ULTRASOUND ABDOMEN LIMITED RIGHT UPPER QUADRANT Gallbladder: No gallstones or wall thickening visualized. No sonographic Murphy sign noted. Common bile duct: Diameter: 2-3 mm  Liver: Echogenic coarsened liver parenchyma. Liver contour appears subtly nodular raising the question of cirrhosis. Portal vein is patent on color Doppler imaging with normal direction of blood flow towards the liver. ULTRASOUND HEPATIC ELASTOGRAPHY Device: Siemens Helix VTQ Patient position: Supine Transducer 9C2 Number of measurements: 12 Hepatic segment:  8 Median kPa: 3.8 IQR: 1.0 IQR/Median kPa ratio: 0.3 Data quality: IQR/Median kPa ratio of 0.3 or greater indicates reduced accuracy Diagnostic category:  < or = 5 kPa: high probability of being normal The use of hepatic elastography is applicable to patients with viral hepatitis and non-alcoholic fatty liver disease. At this time, there is insufficient data for the referenced cut-off values and use in other causes of liver disease, including alcoholic liver disease. Patients, however, may be assessed by elastography and serve as their own reference standard/baseline. In patients with non-alcoholic liver disease, the values suggesting compensated advanced chronic liver disease (cACLD) may be lower, and patients may need additional testing with elasticity results of 7-9 kPa. Please note that abnormal hepatic elasticity and shear wave velocities may also be identified in clinical settings other than with hepatic fibrosis, such as: acute hepatitis, elevated right heart and central venous pressures including use of beta blockers, veno-occlusive disease (Budd-Chiari), infiltrative processes such as mastocytosis/amyloidosis/infiltrative tumor/lymphoma, extrahepatic cholestasis, with hyperemia in  the post-prandial state, and with liver transplantation. Correlation with patient history, laboratory data, and clinical condition recommended. Diagnostic Categories: < or =5 kPa: high probability of being normal < or =9 kPa: in the absence of other known clinical signs, rules out cACLD >9 kPa and ?13 kPa: suggestive of cACLD, but needs further testing >13 kPa: highly suggestive of cACLD > or =17 kPa: highly suggestive of cACLD with an increased probability of clinically significant portal hypertension IMPRESSION: ULTRASOUND RUQ: Echogenic liver parenchyma with subtle nodularity of liver contour raising the question of cirrhosis. ULTRASOUND HEPATIC ELASTOGRAPHY: Median kPa:  3.8 Diagnostic category:  < or = 5 kPa: high probability of being normal Electronically Signed   By: Kennith Center M.D.   On: 01/28/2023 06:44   DG Chest Port 1 View Result Date: 01/27/2023 CLINICAL DATA:  Shortness of breath.  Weakness. EXAM: PORTABLE CHEST 1 VIEW COMPARISON:  01/14/2022 FINDINGS: Shallow inspiration. Heart size and pulmonary vascularity are normal for technique. Lungs are clear. No pleural effusions. No pneumothorax. Mediastinal contours appear intact. IMPRESSION: No active disease. Electronically Signed   By: Burman Nieves M.D.   On: 01/27/2023 22:33    Microbiology: Results for orders placed or performed during the hospital encounter of 01/01/23  SARS Coronavirus 2 by RT PCR (hospital order, performed in [facility] hospital lab) *cepheid single result test* Anterior Nasal Swab     Status: None   Collection Time: 01/02/23  4:56 PM   Specimen: Anterior Nasal Swab  Result Value Ref Range Status   SARS Coronavirus 2 by RT PCR NEGATIVE NEGATIVE Final    Comment: (NOTE) SARS-CoV-2 target nucleic acids are NOT DETECTED.  The SARS-CoV-2 RNA is generally detectable in upper and lower respiratory specimens during the acute phase of infection. The lowest concentration of SARS-CoV-2 viral copies this assay can  detect is 250 copies / mL. A negative result does not preclude SARS-CoV-2 infection and should not be used as the sole basis for treatment or other patient management decisions.  A negative result may occur with improper specimen collection / handling, submission of specimen other than nasopharyngeal swab, presence of viral mutation(s) within the areas targeted by this assay, and inadequate  number of viral copies (<250 copies / mL). A negative result must be combined with clinical observations, patient history, and epidemiological information.  Fact Sheet for Patients:   RoadLapTop.co.za  Fact Sheet for Healthcare Providers: http://kim-miller.com/  This test is not yet approved or  cleared by the [place] FDA and has been authorized for detection and/or diagnosis of SARS-CoV-2 by FDA under an Emergency Use Authorization (EUA).  This EUA will remain in effect (meaning this test can be used) for the duration of the COVID-19 declaration under Section 564(b)(1) of the Act, 21 U.S.C. section 360bbb-3(b)(1), unless the authorization is terminated or revoked sooner.  Performed at [facility], [address] [place], [place] [address]    *Note: Due to a large number of results and/or encounters for the requested time period, some results have not been displayed. A complete set of results can be found in Results Review.    Labs: CBC: No results for input(s): "WBC", "NEUTROABS", "HGB", "HCT", "MCV", "PLT" in the last 168 hours. Basic Metabolic Panel: Recent Labs  Lab 01/29/23 0625 02/02/23 0433 02/04/23 0512  NA 139 137 136  K 3.3* 3.9 3.3*  CL 105 99 97*  CO2 25 31 32  GLUCOSE 108* 97 101*  BUN 5* 10 8  CREATININE 0.84 0.83 0.83  CALCIUM 8.2* 8.5* 8.0*  MG 1.7  --   --   PHOS 3.5  --   --    Liver Function Tests: No results for input(s): "AST", "ALT", "ALKPHOS", "BILITOT", "PROT", "ALBUMIN" in the last  168 hours. CBG: No results for input(s): "GLUCAP" in the last 168 hours.  Discharge time spent: greater than 30 minutes.  Signed: Alba Cory, MD [facility] 02/04/2023

## 2023-02-04 NOTE — Plan of Care (Signed)
New admission.  Problem: Education: Goal: Knowledge of  General Education information/materials will improve Outcome: Not Progressing Goal: Emotional status will improve Outcome: Not Progressing Goal: Mental status will improve Outcome: Not Progressing Goal: Verbalization of understanding the information provided will improve Outcome: Not Progressing   Problem: Activity: Goal: Interest or engagement in activities will improve Outcome: Not Progressing Goal: Sleeping patterns will improve Outcome: Not Progressing   Problem: Coping: Goal: Ability to verbalize frustrations and anger appropriately will improve Outcome: Not Progressing Goal: Ability to demonstrate self-control will improve Outcome: Not Progressing   Problem: Health Behavior/Discharge Planning: Goal: Identification of resources available to assist in meeting health care needs will improve Outcome: Not Progressing Goal: Compliance with treatment plan for underlying cause of condition will improve Outcome: Not Progressing   Problem: Physical Regulation: Goal: Ability to maintain clinical measurements within normal limits will improve Outcome: Not Progressing   Problem: Safety: Goal: Periods of time without injury will increase Outcome: Not Progressing   Problem: Education: Goal: Ability to make informed decisions regarding treatment will improve Outcome: Not Progressing   Problem: Coping: Goal: Coping ability will improve Outcome: Not Progressing   Problem: Self-Concept: Goal: Ability to disclose and discuss suicidal ideas will improve Outcome: Not Progressing   Problem: Coping: Goal: Coping ability will improve Outcome: Not Progressing Goal: Will verbalize feelings Outcome: Not Progressing   Problem: Education: Goal: Will be free of psychotic symptoms Outcome: Not Progressing Goal: Knowledge of the prescribed therapeutic regimen will improve Outcome: Not Progressing

## 2023-02-04 NOTE — Plan of Care (Signed)

## 2023-02-04 NOTE — Progress Notes (Signed)
Pt AAOX4 throughout shift. Slept throughout majority of shift, but alert, calm, cooperative with this RN. This morning has confusion regarding discharge planning. Pt did not fully cooperate with the suicide risk re-assessment questions. She stated she did not want to talk about this anymore and is done answering questions. No other behavioral or safety concerns to report at this time.

## 2023-02-04 NOTE — Progress Notes (Signed)
   02/04/23 1800  Psych Admission Type (Psych Patients Only)  Admission Status Voluntary  Psychosocial Assessment  Patient Complaints Anxiety;Depression;Confusion;Self-harm thoughts  Eye Contact Fair  Facial Expression Pained;Worried  Affect Anxious  Systems analyst;Soft  Interaction Assertive  Motor Activity Slow  Appearance/Hygiene In scrubs  Behavior Characteristics Cooperative;Appropriate to situation  Mood Pleasant;Anxious  Aggressive Behavior  Effect No apparent injury  Thought Process  Coherency WDL  Content WDL  Delusions None reported or observed  Perception Hallucinations  Hallucination Auditory  Judgment WDL  Confusion Mild  Danger to Self  Current suicidal ideation? Passive  Self-Injurious Behavior Some self-injurious ideation observed or expressed.  No lethal plan expressed   Agreement Not to Harm Self Yes  Description of Agreement Verbal  Danger to Others  Danger to Others None reported or observed

## 2023-02-04 NOTE — TOC Progression Note (Addendum)
Transition of Care [facility]) - Progression Note    Patient Details  Name: Shannon [name] MRN: [id number] Date of Birth: 07/07/1965  [facility] CM/SW Contact  Sheriece Jefcoat, Olegario Messier, RN Phone Number: 02/04/2023, 10:41 AM  Clinical Narrative:Awaiting psych eval & recc. If inpt psych needed await order, if voluntary will fax out. IF IVC will await forms to process.       Expected Discharge Plan: Psychiatric Hospital Barriers to Discharge: Continued Medical Work up  Expected Discharge Plan and Services   Discharge Planning Services: CM Consult Post Acute Care Choice: Skilled Nursing Facility Living arrangements for the past 2 months: Hotel/Motel                                       Social Determinants of Health (SDOH) Interventions SDOH Screenings   Food Insecurity: Food Insecurity Present (01/28/2023)  Housing: Patient Declined (01/01/2023)  Transportation Needs: Patient Declined (01/01/2023)  Utilities: Not At Risk (08/29/2022)  Alcohol Screen: Low Risk  (10/07/2021)  Depression (PHQ2-9): High Risk (01/01/2023)  Financial Resource Strain: High Risk (01/01/2023)  Physical Activity: Inactive (01/01/2023)  Social Connections: Socially Isolated (01/01/2023)  Stress: Stress Concern Present (01/01/2023)  Tobacco Use: High Risk (01/27/2023)    Readmission Risk Interventions     No data to display

## 2023-02-04 NOTE — Tx Team (Signed)
Initial Treatment Plan 02/04/2023 6:59 PM RATEEL PLINE [id number]    PATIENT STRESSORS: Financial difficulties   Health problems   Marital or family conflict   Medication change or noncompliance     PATIENT STRENGTHS: Ability for insight  Communication skills  General fund of knowledge  Motivation for treatment/growth  Supportive family/friends    PATIENT IDENTIFIED PROBLEMS: SI   Auditory hallucinations  Depression  Anxiety  Low self-esteem             DISCHARGE CRITERIA:  Ability to meet basic life and health needs Improved stabilization in mood, thinking, and/or behavior Need for constant or close observation no longer present Reduction of life-threatening or endangering symptoms to within safe limits  PRELIMINARY DISCHARGE PLAN: Outpatient therapy Placement in alternative living arrangements Referrals indicated:  Patient is homeless  PATIENT/FAMILY INVOLVEMENT: This treatment plan has been presented to and reviewed with the patient, Shannon [name]. The patient has been given the opportunity to ask questions and make suggestions.  Esparanza Krider, RN 02/04/2023, 6:59 PM

## 2023-02-04 NOTE — Consult Note (Signed)
[facility] Liaison Consult Note   02/04/2023  Shannon [name] [birth year] [id number]  Covering [facility] hospital liaison)  UPDATE: Pt has transferred to [facility] inpatient psych facility. Liaison will update [facility] involved team members.  Of note, [facility] services does not replace or interfere with any services that are needed or arranged by inpatient [facility] care management team.    Elliot Cousin, RN, [facility] Liaison [place] [facility]   [facility], Population Health Office Hours MTWF  8:00 am-6:00 pm Direct Dial: [phone] mobile [phone] [Office toll free line] Office Hours are M-F 8:30 - 5 pm [email or web address]

## 2023-02-04 NOTE — Progress Notes (Signed)
Report given to Doyce Para, RN at [facility] at 1425.

## 2023-02-05 DIAGNOSIS — F250 Schizoaffective disorder, bipolar type: Secondary | ICD-10-CM | POA: Diagnosis not present

## 2023-02-05 MED ORDER — PREGABALIN 75 MG PO CAPS
75.0000 mg | ORAL_CAPSULE | Freq: Every day | ORAL | Status: AC
  Administered 2023-02-05: 75 mg via ORAL
  Filled 2023-02-05: qty 1

## 2023-02-05 MED ORDER — PREGABALIN 25 MG PO CAPS
25.0000 mg | ORAL_CAPSULE | Freq: Two times a day (BID) | ORAL | Status: DC
  Filled 2023-02-05: qty 1

## 2023-02-05 MED ORDER — PREGABALIN 25 MG PO CAPS
25.0000 mg | ORAL_CAPSULE | Freq: Once | ORAL | Status: DC
  Filled 2023-02-05: qty 1

## 2023-02-05 MED ORDER — BUSPIRONE HCL 5 MG PO TABS
10.0000 mg | ORAL_TABLET | Freq: Two times a day (BID) | ORAL | Status: DC
  Administered 2023-02-05 – 2023-02-10 (×11): 10 mg via ORAL
  Filled 2023-02-05 (×10): qty 2

## 2023-02-05 MED ORDER — PREGABALIN 50 MG PO CAPS
50.0000 mg | ORAL_CAPSULE | Freq: Two times a day (BID) | ORAL | Status: DC
  Administered 2023-02-06: 50 mg via ORAL
  Filled 2023-02-05: qty 1

## 2023-02-05 MED ORDER — TRAMADOL HCL 50 MG PO TABS
100.0000 mg | ORAL_TABLET | Freq: Four times a day (QID) | ORAL | Status: DC | PRN
  Administered 2023-02-06 – 2023-02-10 (×10): 100 mg via ORAL
  Filled 2023-02-05 (×11): qty 2

## 2023-02-05 MED ORDER — CLONAZEPAM 0.125 MG PO TBDP
0.1250 mg | ORAL_TABLET | Freq: Two times a day (BID) | ORAL | Status: AC
  Administered 2023-02-08 (×2): 0.125 mg via ORAL
  Filled 2023-02-05: qty 1

## 2023-02-05 MED ORDER — CLONAZEPAM 0.25 MG PO TBDP
0.2500 mg | ORAL_TABLET | Freq: Two times a day (BID) | ORAL | Status: DC
  Administered 2023-02-06 – 2023-02-07 (×4): 0.25 mg via ORAL
  Filled 2023-02-05 (×4): qty 1

## 2023-02-05 MED ORDER — GABAPENTIN 400 MG PO CAPS
400.0000 mg | ORAL_CAPSULE | Freq: Three times a day (TID) | ORAL | Status: DC
  Administered 2023-02-05 – 2023-02-06 (×3): 400 mg via ORAL
  Filled 2023-02-05 (×3): qty 1

## 2023-02-05 MED ORDER — TOPIRAMATE 25 MG PO TABS
50.0000 mg | ORAL_TABLET | Freq: Two times a day (BID) | ORAL | Status: DC
  Administered 2023-02-05 – 2023-02-10 (×10): 50 mg via ORAL
  Filled 2023-02-05 (×9): qty 2

## 2023-02-05 MED ORDER — MAGNESIUM CITRATE PO SOLN
1.0000 | Freq: Once | ORAL | Status: AC
  Administered 2023-02-05: 1 via ORAL
  Filled 2023-02-05: qty 296

## 2023-02-05 MED ORDER — CLONAZEPAM 0.5 MG PO TABS
0.5000 mg | ORAL_TABLET | Freq: Two times a day (BID) | ORAL | Status: AC
  Administered 2023-02-05 – 2023-02-06 (×2): 0.5 mg via ORAL
  Filled 2023-02-05 (×2): qty 1

## 2023-02-05 NOTE — Progress Notes (Signed)
Patient had complaints of pain and AH. Patient given PRN Tramadol, as well as Haldol/Benadryl to help with her complaints. Patient tolerated medication well, without any issues. Patient remains safe on the unit and will continue to be monitored.    02/05/23 1252  Pain Assessment  Pain Scale 0-10  Pain Score 9  Pain Type Chronic pain;Neuropathic pain  Pain Location Generalized  Pain Orientation Right;Left;Upper;Lower  Pain Descriptors / Indicators Jabbing  Pain Frequency Constant  Pain Onset On-going  Pain Intervention(s) Medication (See eMAR)  Multiple Pain Sites No  Complaints & Interventions  Complains of Hallucinations  Interventions Medication (see MAR) (Haldol and Benadryl)

## 2023-02-05 NOTE — [facility] Suicide Risk Assessment (Signed)
[facility] INPATIENT:  Family/Significant Other Suicide Prevention Education  Suicide Prevention Education:  Contact Attempts: Lajuan Lines, brother, [phone], (name of family member/significant other) has been identified by the patient as the family member/significant other with whom the patient will be residing, and identified as the person(s) who will aid the patient in the event of a mental health crisis.  With written consent from the patient, two attempts were made to provide suicide prevention education, prior to and/or following the patient's discharge.  We were unsuccessful in providing suicide prevention education.  A suicide education pamphlet was given to the patient to share with family/significant other.  Date and time of first attempt: 02/05/2023 1:43PM Date and time of second attempt: Second attempt is needed  CSW left HIPAA compliant voicemail.   Harden Mo 02/05/2023, 1:42 PM

## 2023-02-05 NOTE — Progress Notes (Signed)
   02/05/23 1252  Psych Admission Type (Psych Patients Only)  Admission Status Voluntary  Psychosocial Assessment  Patient Complaints Anxiety;Depression;Self-harm thoughts;Hopelessness  Eye Contact Fair  Facial Expression Pained;Worried  Affect Anxious;Preoccupied  Surveyor, minerals Activity Slow  Appearance/Hygiene Layered clothes;In scrubs;Unremarkable  Behavior Characteristics Cooperative;Appropriate to situation  Mood Preoccupied;Pleasant  Aggressive Behavior  Effect No apparent injury  Thought Process  Coherency WDL  Content WDL  Delusions None reported or observed  Perception Hallucinations  Hallucination Auditory  Judgment WDL  Confusion Mild  Danger to Self  Current suicidal ideation? Passive  Self-Injurious Behavior Some self-injurious ideation observed or expressed.  No lethal plan expressed   Agreement Not to Harm Self Yes  Description of Agreement Verbal  Danger to Others  Danger to Others None reported or observed

## 2023-02-05 NOTE — Plan of Care (Signed)
  Problem: Education: Goal: Knowledge of [facility] Education information/materials will improve Outcome: Progressing Goal: Emotional status will improve Outcome: Progressing Goal: Mental status will improve Outcome: Progressing Goal: Verbalization of understanding the information provided will improve Outcome: Progressing   Problem: Activity: Goal: Interest or engagement in activities will improve Outcome: Progressing Goal: Sleeping patterns will improve Outcome: Progressing   Problem: Coping: Goal: Ability to verbalize frustrations and anger appropriately will improve Outcome: Progressing Goal: Ability to demonstrate self-control will improve Outcome: Progressing   Problem: Health Behavior/Discharge Planning: Goal: Identification of resources available to assist in meeting health care needs will improve Outcome: Progressing Goal: Compliance with treatment plan for underlying cause of condition will improve Outcome: Progressing   Problem: Physical Regulation: Goal: Ability to maintain clinical measurements within normal limits will improve Outcome: Progressing   Problem: Safety: Goal: Periods of time without injury will increase Outcome: Progressing   Problem: Education: Goal: Ability to make informed decisions regarding treatment will improve Outcome: Progressing   Problem: Coping: Goal: Coping ability will improve Outcome: Progressing   Problem: Self-Concept: Goal: Ability to disclose and discuss suicidal ideas will improve Outcome: Progressing Goal: Will verbalize positive feelings about self Outcome: Progressing Note: Patient is on track. Patient will work on increased adherence    Problem: Coping: Goal: Coping ability will improve Outcome: Progressing Goal: Will verbalize feelings Outcome: Progressing   Problem: Education: Goal: Will be free of psychotic symptoms Outcome: Progressing Goal: Knowledge of the prescribed therapeutic regimen will  improve Outcome: Progressing

## 2023-02-05 NOTE — BH IP Treatment Plan (Addendum)
Interdisciplinary Treatment and Diagnostic Plan Update  02/05/2023 Time of Session: 8:49AM Shannon [name] MRN: [id number]  Principal Diagnosis: Schizoaffective disorder, bipolar type (HCC)  Secondary Diagnoses: Principal Problem:   Schizoaffective disorder, bipolar type (HCC)   Current Medications:  Current Facility-Administered Medications  Medication Dose Route Frequency Provider Last Rate Last Admin   acetaminophen (TYLENOL) tablet 650 mg  650 mg Oral Q6H PRN Starkes-Perry, Juel Burrow, FNP       alum & mag hydroxide-simeth (MAALOX/MYLANTA) 200-200-20 MG/5ML suspension 30 mL  30 mL Oral Q4H PRN Starkes-Perry, Juel Burrow, FNP       busPIRone (BUSPAR) tablet 7.5 mg  7.5 mg Oral BID Maryagnes Amos, FNP   7.5 mg at 02/05/23 0913   calcium carbonate (TUMS - dosed in mg elemental calcium) chewable tablet 200 mg of elemental calcium  1 tablet Oral TID WC Maryagnes Amos, FNP   200 mg of elemental calcium at 02/05/23 0914   clonazePAM (KLONOPIN) tablet 0.5 mg  0.5 mg Oral BID PRN Maryagnes Amos, FNP   0.5 mg at 02/04/23 1825   cyanocobalamin (VITAMIN B12) tablet 1,000 mcg  1,000 mcg Oral Daily Maryagnes Amos, FNP   1,000 mcg at 02/05/23 4098   haloperidol (HALDOL) tablet 5 mg  5 mg Oral TID PRN Maryagnes Amos, FNP       And   diphenhydrAMINE (BENADRYL) capsule 50 mg  50 mg Oral TID PRN Starkes-Perry, Juel Burrow, FNP       feeding supplement (ENSURE ENLIVE / ENSURE PLUS) liquid 237 mL  237 mL Oral BID BM Starkes-Perry, Juel Burrow, FNP       gabapentin (NEURONTIN) capsule 300 mg  300 mg Oral TID Maryagnes Amos, FNP   300 mg at 02/05/23 0914   hydrochlorothiazide (HYDRODIURIL) tablet 6.25 mg  6.25 mg Oral Daily Maryagnes Amos, FNP   6.25 mg at 02/05/23 0913   hydrOXYzine (ATARAX) tablet 50 mg  50 mg Oral QHS Maryagnes Amos, FNP   50 mg at 02/04/23 2110   magnesium hydroxide (MILK OF MAGNESIA) suspension 30 mL  30 mL Oral Daily PRN Maryagnes Amos, FNP   30 mL at 02/04/23 1825   multivitamin with minerals tablet 1 tablet  1 tablet Oral BID WC Maryagnes Amos, FNP   1 tablet at 02/05/23 0915   nicotine polacrilex (NICORETTE) gum 2 mg  2 mg Oral PRN Maryagnes Amos, FNP       Oxcarbazepine (TRILEPTAL) tablet 300 mg  300 mg Oral BID Maryagnes Amos, FNP   300 mg at 02/05/23 0915   pantoprazole (PROTONIX) EC tablet 40 mg  40 mg Oral BID AC Maryagnes Amos, FNP   40 mg at 02/05/23 0915   polyethylene glycol (MIRALAX / GLYCOLAX) packet 17 g  17 g Oral Daily Maryagnes Amos, FNP   17 g at 02/05/23 0913   pregabalin (LYRICA) capsule 75 mg  75 mg Oral BID Maryagnes Amos, FNP   75 mg at 02/05/23 0914   QUEtiapine (SEROQUEL) tablet 25 mg  25 mg Oral BID Maryagnes Amos, FNP   25 mg at 02/05/23 0914   QUEtiapine (SEROQUEL) tablet 300 mg  300 mg Oral QHS Maryagnes Amos, FNP   300 mg at 02/04/23 2109   simethicone (MYLICON) 40 MG/0.6ML suspension 40 mg  40 mg Oral QID PRN Maryagnes Amos, FNP       traMADol Janean Sark) tablet 50-100 mg  50-100  mg Oral Q6H PRN Maryagnes Amos, FNP   100 mg at 02/05/23 0400   PTA Medications: Medications Prior to Admission  Medication Sig Dispense Refill Last Dose/Taking   atorvastatin (LIPITOR) 20 MG tablet TAKE ONE TABLET BY MOUTH ONCE DAILY FOR CHOLESTEROL (Patient taking differently: Take 20 mg by mouth daily.) 90 tablet 1    busPIRone (BUSPAR) 7.5 MG tablet Take 7.5 mg by mouth 2 (two) times daily.      clonazePAM (KLONOPIN) 0.5 MG tablet Take 1 tablet (0.5 mg total) by mouth 2 (two) times daily as needed for anxiety. 180 tablet 0    cyanocobalamin 1000 MCG tablet Take 1 tablet (1,000 mcg total) by mouth daily. 30 tablet 0    gabapentin (NEURONTIN) 300 MG capsule Take 1 capsule (300 mg total) by mouth 3 (three) times daily. 90 capsule 0    hydrochlorothiazide (HYDRODIURIL) 12.5 MG tablet Take 0.5 tablets (6.25 mg total) by mouth daily. 15  tablet 0    hydrOXYzine (ATARAX) 50 MG tablet Take 50 mg by mouth at bedtime.      Multiple Vitamins-Minerals (BARIATRIC MULTIVITAMINS/IRON) CAPS Take 1 capsule by mouth daily. 30 capsule 2    Oxcarbazepine (TRILEPTAL) 300 MG tablet Take 300 mg by mouth 2 (two) times daily.      pantoprazole (PROTONIX) 40 MG tablet Take 1 tablet (40 mg total) by mouth 2 (two) times daily before a meal. 180 tablet 0    potassium chloride SA (KLOR-CON M) 20 MEQ tablet Take 1 tablet (20 mEq total) by mouth daily. 30 tablet 0    pregabalin (LYRICA) 75 MG capsule Take 1 capsule (75 mg total) by mouth 2 (two) times daily. 60 capsule 0    QUEtiapine (SEROQUEL) 25 MG tablet Take 1 tablet (25 mg total) by mouth 2 (two) times daily. 180 tablet 0    QUEtiapine (SEROQUEL) 300 MG tablet Take 1 tablet (300 mg total) by mouth at bedtime. 90 tablet 0    thiamine (VITAMIN B1) 100 MG tablet Take 1 tablet (100 mg total) by mouth daily. 30 tablet 0     Patient Stressors: Financial difficulties   Health problems   Marital or family conflict   Medication change or noncompliance    Patient Strengths: Ability for insight  Forensic psychologist fund of knowledge  Motivation for treatment/growth  Supportive family/friends   Treatment Modalities: Medication Management, Group therapy, Case management,  1 to 1 session with clinician, Psychoeducation, Recreational therapy.   Physician Treatment Plan for Primary Diagnosis: Schizoaffective disorder, bipolar type (HCC) Long Term Goal(s):     Short Term Goals:    Medication Management: Evaluate patient's response, side effects, and tolerance of medication regimen.  Therapeutic Interventions: 1 to 1 sessions, Unit Group sessions and Medication administration.  Evaluation of Outcomes: Not Met  Physician Treatment Plan for Secondary Diagnosis: Principal Problem:   Schizoaffective disorder, bipolar type (HCC)  Long Term Goal(s):     Short Term Goals:       Medication  Management: Evaluate patient's response, side effects, and tolerance of medication regimen.  Therapeutic Interventions: 1 to 1 sessions, Unit Group sessions and Medication administration.  Evaluation of Outcomes: Not Met   RN Treatment Plan for Primary Diagnosis: Schizoaffective disorder, bipolar type (HCC) Long Term Goal(s): Knowledge of disease and therapeutic regimen to maintain health will improve  Short Term Goals: Ability to verbalize frustration and anger appropriately will improve, Ability to demonstrate self-control, Ability to participate in decision making will improve, Ability to  verbalize feelings will improve, and Ability to identify and develop effective coping behaviors will improve  Medication Management: RN will administer medications as ordered by provider, will assess and evaluate patient's response and provide education to patient for prescribed medication. RN will report any adverse and/or side effects to prescribing provider.  Therapeutic Interventions: 1 on 1 counseling sessions, Psychoeducation, Medication administration, Evaluate responses to treatment, Monitor vital signs and CBGs as ordered, Perform/monitor CIWA, COWS, AIMS and Fall Risk screenings as ordered, Perform wound care treatments as ordered.  Evaluation of Outcomes: Not Met   LCSW Treatment Plan for Primary Diagnosis: Schizoaffective disorder, bipolar type (HCC) Long Term Goal(s): Safe transition to appropriate next level of care at discharge, Engage patient in therapeutic group addressing interpersonal concerns.  Short Term Goals: Engage patient in aftercare planning with referrals and resources, Increase ability to appropriately verbalize feelings, Increase emotional regulation, Facilitate acceptance of mental health diagnosis and concerns, and Increase skills for wellness and recovery  Therapeutic Interventions: Assess for all discharge needs, 1 to 1 time with Social worker, Explore available resources  and support systems, Assess for adequacy in community support network, Educate family and significant other(s) on suicide prevention, Complete Psychosocial Assessment, Interpersonal group therapy.  Evaluation of Outcomes: Not Met   Progress in Treatment: Attending groups: Yes. Participating in groups: Yes. Taking medication as prescribed: Yes. Toleration medication: Yes. Family/Significant other contact made: No, will contact:  CSW to contact once permission is granted.  Patient understands diagnosis: Yes. Discussing patient identified problems/goals with staff: Yes. Medical problems stabilized or resolved: Yes. and No. Denies suicidal/homicidal ideation: Yes. Issues/concerns per patient self-inventory: Yes. Other: None  New problem(s) identified: Yes, Describe:  Patient endorsed hallucinations. Patient added that her mental health declined prior to her trip to [place], [place]. Patient sold all her belongings prior to going and is now unhoused.   New Short Term/Long Term Goal(s):detox, elimination of symptoms of psychosis, medication management for mood stabilization; elimination of SI thoughts; development of comprehensive mental wellness/sobriety plan.    Patient Goals:  "To get the right medication for my mental disorder."  Discharge Plan or Barriers: CSW to assist patient in the development of appropriate discharge plan.   Reason for Continuation of Hospitalization: Anxiety Depression Hallucinations Medication stabilization Suicidal ideation  Estimated Length of Stay:1-7 days.   Last 3 [place] Suicide Severity Risk Score: Flowsheet Row Admission (Current) from 02/04/2023 in [facility] INPATIENT BEHAVIORAL MEDICINE ED to Hosp-Admission (Discharged) from 01/27/2023 in [facility] LONG 4TH FLOOR PROGRESSIVE CARE AND UROLOGY ED from 01/01/2023 in [facility] Emergency Department at [facility]  C-SSRS RISK CATEGORY Moderate Risk Moderate Risk High Risk       Last PHQ 2/9  Scores:    01/01/2023    3:21 PM 04/07/2022    9:58 AM 02/02/2022    1:08 PM  Depression screen PHQ 2/9  Decreased Interest 3 1 2   Down, Depressed, Hopeless 3 1 2   PHQ - 2 Score 6 2 4   Altered sleeping 3 1 1   Tired, decreased energy 3 1 1   Change in appetite 3 1 1   Feeling bad or failure about yourself  3 1 1   Trouble concentrating 3 1 1   Moving slowly or fidgety/restless 3 1 1   Suicidal thoughts 3 0 0  PHQ-9 Score 27 8 10   Difficult doing work/chores Extremely dIfficult Somewhat difficult Very difficult    Scribe for Treatment Team: [name], LCSW 02/05/2023 9:41 AM

## 2023-02-05 NOTE — H&P (Signed)
Psychiatric Admission Assessment Adult  Patient Identification: Shannon [name] MRN:  [id number] Date of Evaluation:  02/05/2023 Chief Complaint:  Schizoaffective disorder, bipolar type (HCC) [F25.0] Principal Diagnosis: Schizoaffective disorder, bipolar type (HCC) Diagnosis:  Principal Problem:   Schizoaffective disorder, bipolar type (HCC)  History of Present Illness: 57 year old African American female with a psychiatric history of Bipolar Disorder and PTSD, presenting for ongoing inpatient management after being admitted on 01/27/2023 for hypokalemia, bilateral lower extremity edema, and weakness. She also has a medical history of gastric sleeve surgery (2023), hypertension, peripheral neuropathy, and morbid obesity. The patient has expressed a desire to taper off benzodiazepines (Klonopin 0.25 mg BID PRN) and discontinue Lyrica, stating that she feels they are no longer beneficial and wishes to explore alternatives. The patient denies any active suicidal ideation (SI), homicidal ideation (HI), or auditory/visual hallucinations (AVH) at present. However, she continues to experience chronic mood instability, auditory hallucinations, and occasional passive SI, all consistent with her underlying bipolar and PTSD diagnoses.The patient reports experiencing significant waxing and waning mentation, disorganization in thought processes, and occasional confabulation, which raises concerns for potential Wernicke's encephalopathy secondary to malabsorption from her gastric sleeve surgery. She endorses hearing voices that are critical and degrading but denies active plans or intent to harm herself or others.Collateral information from the nursing team and a friend at the bedside highlights memory lapses and cognitive fluctuations, with forgetfulness occurring within minutes. Despite these challenges, the patient has been compliant with her outpatient psychotropic medications (Seroquel, Trileptal, hydroxyzine, and  BuSpar) prior to admission and reports long-term stability on these medications for approximately a decade. The patient's current presentation, including her disorganized thinking and mood symptoms, appears to be influenced by her medical and nutritional deficiencies, for which ongoing treatment (e.g., thiamine supplementation and electrolyte repletion) is being provided. She is motivated to discontinue certain medications under supervision and seeks a plan that addresses both her psychiatric and medical needs.   Associated Signs/Symptoms: Depression Symptoms:  depressed mood, anhedonia, hypersomnia, hopelessness, impaired memory, loss of energy/fatigue, disturbed sleep, decreased labido, (Hypo) Manic Symptoms:  Distractibility, Irritable Mood, Labiality of Mood, Anxiety Symptoms:  Excessive Worry, Psychotic Symptoms:  Ideas of Reference, PTSD Symptoms: Negative Total Time spent with patient: 2.5 hours  Past Psychiatric History: Bipolar  Is the patient at risk to self? No.  Has the patient been a risk to self in the past 6 months? No.  Has the patient been a risk to self within the distant past? No.  Is the patient a risk to others? No.  Has the patient been a risk to others in the past 6 months? No.  Has the patient been a risk to others within the distant past? No.   [place] Scale:  Flowsheet Row Admission (Current) from 02/04/2023 in [facility] INPATIENT BEHAVIORAL MEDICINE ED to Hosp-Admission (Discharged) from 01/27/2023 in [place] LONG 4TH FLOOR PROGRESSIVE CARE AND UROLOGY ED from 01/01/2023 in [facility] Emergency Department at [facility]  C-SSRS RISK CATEGORY Moderate Risk Moderate Risk High Risk        Prior Inpatient Therapy: Yes.   If yes, describe   Prior Outpatient Therapy: Yes.   If yes, describe moved to [place]   Alcohol Screening: 1. How often do you have a drink containing alcohol?: Never 2. How many drinks containing alcohol do you have on a typical day  when you are drinking?: 1 or 2 3. How often do you have six or more drinks on one occasion?: Never AUDIT-C Score: 0 4.  How often during the last year have you found that you were not able to stop drinking once you had started?: Never 5. How often during the last year have you failed to do what was normally expected from you because of drinking?: Never 6. How often during the last year have you needed a first drink in the morning to get yourself going after a heavy drinking session?: Never 7. How often during the last year have you had a feeling of guilt of remorse after drinking?: Never 8. How often during the last year have you been unable to remember what happened the night before because you had been drinking?: Never 9. Have you or someone else been injured as a result of your drinking?: No 10. Has a relative or friend or a doctor or another health worker been concerned about your drinking or suggested you cut down?: No Alcohol Use Disorder Identification Test Final Score (AUDIT): 0 Substance Abuse History in the last 12 months:  No. Consequences of Substance Abuse: Negative Previous Psychotropic Medications: Yes  Psychological Evaluations: Yes  Past Medical History:  Past Medical History:  Diagnosis Date   Allergic rhinitis    Allergic rhinitis 01/25/2009        ANEMIA-IRON DEFICIENCY 01/25/2009   Anxiety    Backache 01/25/2009   Qualifier: Diagnosis of  By: Jonny Ruiz MD, Len Blalock    Bipolar 1 disorder [facility])    Chronic pain syndrome    Complete rotator cuff tear 07/21/2013   Complete tear of right rotator cuff 07/21/2013   Contact lens/glasses fitting    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   DISC DISEASE, LUMBAR 01/25/2009   Essential hypertension 01/25/2009   On Bystolic    GAD (generalized anxiety disorder) 12/28/2013   GERD (gastroesophageal reflux disease)    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   Headache, acute 12/20/2013   Hx of laparoscopic gastric banding 09/03/2010    Surgery date: 07/17/10    HYPERLIPIDEMIA 01/25/2009   Hyperlipidemia with target LDL less than 130 01/25/2009   HYPERTENSION 01/25/2009   Hypokalemia, inadequate intake 09/01/2011   Insomnia 04/04/2011   INSOMNIA-SLEEP DISORDER-UNSPEC 04/30/2009   Qualifier: Diagnosis of  By: Jonny Ruiz MD, Len Blalock    Iron deficiency anemia 01/25/2009        Labyrinthitis 02/19/2014   Leiomyoma of uterus, unspecified 08/13/2008   Overview:  Leiomyoma Of The Uterus  10/1 IMO update   Localized edema 06/12/2015   MANIC DEPRESSIVE ILLNESS 01/25/2009   pt is unsue if this is her specifc dx   Mixed bipolar I disorder (HCC) 07/12/2012   Morbid obesity (HCC) 01/25/2009   Night sweats    Osteopenia determined by x-ray 06/13/2021   Other abnormal glucose 10/26/2012   Palpitations 10/25/2013   PEPTIC ULCER DISEASE, HELICOBACTER PYLORI POSITIVE 10/03/2009   Peripheral edema 06/25/2015   Pernicious anemia 03/28/2012   Piriformis syndrome of both sides 09/02/2018   PUD (peptic ulcer disease) 10/03/2009        Right shoulder pain 05/03/2013   Dg Shoulder Right  05/03/2013   CLINICAL DATA Pain.  EXAM RIGHT SHOULDER - 2+ VIEW  COMPARISON None.  FINDINGS Acromioclavicular and glenohumeral degenerative change present. Questionable calcific density noted in the region of the supraspinatus space, possibly representing calcific supraspinatus tendinitis. This could represent a sclerotic density in the acromion. MRI of the right shoulder suggest   Sleep apnea 08/12/2016   SMOKER 01/25/2009   Qualifier: Diagnosis of  By: Jonny Ruiz MD, Len Blalock  Subacromial bursitis 05/17/2013   SUBSTANCE ABUSE 11/06/2009        Thiamin deficiency 10/30/2012   Tobacco abuse 06/09/2010   Visual disturbance 05/03/2013   Vitamin D deficiency 10/27/2012    Past Surgical History:  Procedure Laterality Date   ABDOMINAL HYSTERECTOMY     BLADDER SURGERY     s/p with ?diverticulitis   HAND TENDON SURGERY  1991   s/p-Right-index and middle   LAPAROSCOPIC GASTRIC BAND REMOVAL WITH  LAPAROSCOPIC GASTRIC SLEEVE RESECTION N/A 08/17/2016   Procedure: LAPAROSCOPIC GASTRIC BAND REMOVAL WITH LAPAROSCOPIC GASTRIC SLEEVE RESECTION WITH UPPER ENDO;  Surgeon: Ovidio Kin, MD;  Location: [facility];  Service: General;  Laterality: N/A;   LAPAROSCOPIC GASTRIC BANDING  07/14/10   SHOULDER ARTHROSCOPY Right 07/21/2013   Procedure: RIGHT ARTHROSCOPY SHOULDER DEBRIDMENT EXTENTSIVE,ARTHROSCOPIC REMOVE LOOSE FOREIGN BODY, BICEPS TENOLYSIS ;  Surgeon: Eulas Post, MD;  Location: [facility];  Service: Orthopedics;  Laterality: Right;   TUBAL LIGATION     Family History:  Family History  Problem Relation Age of Onset   Hypertension Mother    Stroke Father    Cirrhosis Father        ETOH   Hypertension Father    Diabetes Father    Alcohol abuse Father    Hypertension Sister    Asthma Sister    Hypertension Brother    Hypertension Paternal Aunt    Diabetes Maternal Grandmother    ADD / ADHD Son    ADD / ADHD Other        3 nephews, also emotional issues   Diabetes Other        Uncle   Diabetes Other        Aunt   Suicidality Neg Hx    Family Psychiatric  History: see above Tobacco Screening:  Social History   Tobacco Use  Smoking Status Every Day   Current packs/day: 0.25   Average packs/day: 0.3 packs/day for 20.0 years (5.0 ttl pk-yrs)   Types: Cigarettes  Smokeless Tobacco Never  Tobacco Comments   States has cut back to 5-7 a day and working on this.     [facility] Tobacco Counseling     Are you interested in Tobacco Cessation Medications?  Yes, implement Nicotene Replacement Protocol Counseled patient on smoking cessation:  Refused/Declined practical counseling Reason Tobacco Screening Not Completed: No value filed.       Social History:  Social History   Substance and Sexual Activity  Alcohol Use No   Alcohol/week: 0.0 standard drinks of alcohol     Social History   Substance and Sexual Activity  Drug Use No    Additional Social  History: Marital status: Divorced Divorced, when?: Pt reports that divorce will be final in January 2025 Are you sexually active?: No What is your sexual orientation?: Heterosexual Has your sexual activity been affected by drugs, alcohol, medication, or emotional stress?: "No, in my religion I can't have sex unless I am married" Does patient have children?: Yes How many children?: 2 How is patient's relationship with their children?: Pt reports that she has 2 adult children and does not have a relationship with either.                         Allergies:   Allergies  Allergen Reactions   Pork-Derived Products Other (See Comments) and Anaphylaxis    Religious Reasons (Muslim)   Lamictal [Lamotrigine] Rash   Lab Results:  Results for  orders placed or performed during the hospital encounter of 01/27/23 (from the past 48 hours)  Basic metabolic panel     Status: Abnormal   Collection Time: 02/04/23  5:12 AM  Result Value Ref Range   Sodium 136 135 - 145 mmol/L   Potassium 3.3 (L) 3.5 - 5.1 mmol/L   Chloride 97 (L) 98 - 111 mmol/L   CO2 32 22 - 32 mmol/L   Glucose, Bld 101 (H) 70 - 99 mg/dL    Comment: Glucose reference range applies only to samples taken after fasting for at least 8 hours.   BUN 8 6 - 20 mg/dL   Creatinine, Ser 8.29 0.44 - 1.00 mg/dL   Calcium 8.0 (L) 8.9 - 10.3 mg/dL   GFR, Estimated >56 >21 mL/min    Comment: (NOTE) Calculated using the CKD-EPI Creatinine Equation (2021)    Anion gap 7 5 - 15    Comment: Performed at [facility], [address] [place], [place] [address]  VITAMIN D 25 Hydroxy (Vit-D Deficiency, Fractures)     Status: None   Collection Time: 02/04/23  5:12 AM  Result Value Ref Range   Vit D, 25-Hydroxy 31.72 30 - 100 ng/mL    Comment: (NOTE) Vitamin D deficiency has been defined by the Institute of Medicine  and an Endocrine Society practice guideline as a level of serum 25-OH  vitamin D less than 20 ng/mL  (1,2). The Endocrine Society went on to  further define vitamin D insufficiency as a level between 21 and 29  ng/mL (2).  1. IOM (Institute of Medicine). 2010. Dietary reference intakes for  calcium and D. [place]: [employer]. 2. Holick MF, Binkley [place], Bischoff-Ferrari HA, et al. Evaluation,  treatment, and prevention of vitamin D deficiency: an Endocrine  Society clinical practice guideline, JCEM. 2011 Jul; 96(7): 1911-30.  Performed at [facility], [address], [place], [place] [address]    *Note: Due to a large number of results and/or encounters for the requested time period, some results have not been displayed. A complete set of results can be found in Results Review.    Blood Alcohol level:  Lab Results  Component Value Date   [facility] <10 01/01/2023   ETH <11 07/11/2012    Metabolic Disorder Labs:  Lab Results  Component Value Date   HGBA1C 5.5 01/29/2023   MPG 111.15 01/29/2023   No results found for: "PROLACTIN" Lab Results  Component Value Date   CHOL 244 (H) 05/14/2021   TRIG 164.0 (H) 05/14/2021   HDL 51.60 05/14/2021   CHOLHDL 5 05/14/2021   VLDL 32.8 05/14/2021   LDLCALC 160 (H) 05/14/2021   LDLCALC 180 (H) 11/22/2019    Current Medications: Current Facility-Administered Medications  Medication Dose Route Frequency Provider Last Rate Last Admin   acetaminophen (TYLENOL) tablet 650 mg  650 mg Oral Q6H PRN Starkes-Perry, Juel Burrow, FNP       alum & mag hydroxide-simeth (MAALOX/MYLANTA) 200-200-20 MG/5ML suspension 30 mL  30 mL Oral Q4H PRN Starkes-Perry, Juel Burrow, FNP       busPIRone (BUSPAR) tablet 10 mg  10 mg Oral BID Myriam Forehand, NP       Melene Muller ON 02/08/2023] clonazepam (KLONOPIN) disintegrating tablet 0.125 mg  0.125 mg Oral BID Myriam Forehand, NP       Melene Muller ON 02/06/2023] clonazePAM (KLONOPIN) disintegrating tablet 0.25 mg  0.25 mg Oral BID Myriam Forehand, NP  clonazePAM (KLONOPIN) tablet 0.5 mg  0.5 mg Oral BID  PRN Maryagnes Amos, FNP   0.5 mg at 02/04/23 1825   clonazePAM (KLONOPIN) tablet 0.5 mg  0.5 mg Oral BID Myriam Forehand, NP       cyanocobalamin (VITAMIN B12) tablet 1,000 mcg  1,000 mcg Oral Daily Maryagnes Amos, FNP   1,000 mcg at 02/05/23 1610   haloperidol (HALDOL) tablet 5 mg  5 mg Oral TID PRN Maryagnes Amos, FNP   5 mg at 02/05/23 1252   And   diphenhydrAMINE (BENADRYL) capsule 50 mg  50 mg Oral TID PRN Maryagnes Amos, FNP   50 mg at 02/05/23 1252   feeding supplement (ENSURE ENLIVE / ENSURE PLUS) liquid 237 mL  237 mL Oral BID BM Starkes-Perry, Juel Burrow, FNP       gabapentin (NEURONTIN) capsule 400 mg  400 mg Oral TID Myriam Forehand, NP       hydrochlorothiazide (HYDRODIURIL) tablet 6.25 mg  6.25 mg Oral Daily Maryagnes Amos, FNP   6.25 mg at 02/05/23 0913   hydrOXYzine (ATARAX) tablet 50 mg  50 mg Oral QHS Maryagnes Amos, FNP   50 mg at 02/04/23 2110   magnesium citrate solution 1 Bottle  1 Bottle Oral Once Myriam Forehand, NP       magnesium hydroxide (MILK OF MAGNESIA) suspension 30 mL  30 mL Oral Daily PRN Maryagnes Amos, FNP   30 mL at 02/04/23 1825   multivitamin with minerals tablet 1 tablet  1 tablet Oral BID WC Maryagnes Amos, FNP   1 tablet at 02/05/23 0915   nicotine polacrilex (NICORETTE) gum 2 mg  2 mg Oral PRN Maryagnes Amos, FNP       Oxcarbazepine (TRILEPTAL) tablet 300 mg  300 mg Oral BID Maryagnes Amos, FNP   300 mg at 02/05/23 0915   pantoprazole (PROTONIX) EC tablet 40 mg  40 mg Oral BID AC Maryagnes Amos, FNP   40 mg at 02/05/23 0915   polyethylene glycol (MIRALAX / GLYCOLAX) packet 17 g  17 g Oral Daily Maryagnes Amos, FNP   17 g at 02/05/23 0913   [START ON 02/07/2023] pregabalin (LYRICA) capsule 25 mg  25 mg Oral BID Myriam Forehand, NP       Melene Muller ON 02/08/2023] pregabalin (LYRICA) capsule 25 mg  25 mg Oral Once Myriam Forehand, NP       [START ON 02/06/2023] pregabalin (LYRICA)  capsule 50 mg  50 mg Oral BID Myriam Forehand, NP       pregabalin (LYRICA) capsule 75 mg  75 mg Oral Daily Myriam Forehand, NP       QUEtiapine (SEROQUEL) tablet 25 mg  25 mg Oral BID Maryagnes Amos, FNP   25 mg at 02/05/23 0914   QUEtiapine (SEROQUEL) tablet 300 mg  300 mg Oral QHS Maryagnes Amos, FNP   300 mg at 02/04/23 2109   simethicone (MYLICON) 40 MG/0.6ML suspension 40 mg  40 mg Oral QID PRN Maryagnes Amos, FNP       topiramate (TOPAMAX) tablet 50 mg  50 mg Oral BID Myriam Forehand, NP       traMADol Janean Sark) tablet 100 mg  100 mg Oral Q6H PRN Myriam Forehand, NP       PTA Medications: Medications Prior to Admission  Medication Sig Dispense Refill Last Dose/Taking   atorvastatin (LIPITOR) 20 MG tablet TAKE  ONE TABLET BY MOUTH ONCE DAILY FOR CHOLESTEROL (Patient taking differently: Take 20 mg by mouth daily.) 90 tablet 1    busPIRone (BUSPAR) 7.5 MG tablet Take 7.5 mg by mouth 2 (two) times daily.      clonazePAM (KLONOPIN) 0.5 MG tablet Take 1 tablet (0.5 mg total) by mouth 2 (two) times daily as needed for anxiety. 180 tablet 0    cyanocobalamin 1000 MCG tablet Take 1 tablet (1,000 mcg total) by mouth daily. 30 tablet 0    gabapentin (NEURONTIN) 300 MG capsule Take 1 capsule (300 mg total) by mouth 3 (three) times daily. 90 capsule 0    hydrochlorothiazide (HYDRODIURIL) 12.5 MG tablet Take 0.5 tablets (6.25 mg total) by mouth daily. 15 tablet 0    hydrOXYzine (ATARAX) 50 MG tablet Take 50 mg by mouth at bedtime.      Multiple Vitamins-Minerals (BARIATRIC MULTIVITAMINS/IRON) CAPS Take 1 capsule by mouth daily. 30 capsule 2    Oxcarbazepine (TRILEPTAL) 300 MG tablet Take 300 mg by mouth 2 (two) times daily.      pantoprazole (PROTONIX) 40 MG tablet Take 1 tablet (40 mg total) by mouth 2 (two) times daily before a meal. 180 tablet 0    potassium chloride SA (KLOR-CON M) 20 MEQ tablet Take 1 tablet (20 mEq total) by mouth daily. 30 tablet 0    pregabalin (LYRICA) 75 MG  capsule Take 1 capsule (75 mg total) by mouth 2 (two) times daily. 60 capsule 0    QUEtiapine (SEROQUEL) 25 MG tablet Take 1 tablet (25 mg total) by mouth 2 (two) times daily. 180 tablet 0    QUEtiapine (SEROQUEL) 300 MG tablet Take 1 tablet (300 mg total) by mouth at bedtime. 90 tablet 0    thiamine (VITAMIN B1) 100 MG tablet Take 1 tablet (100 mg total) by mouth daily. 30 tablet 0     Musculoskeletal: Strength & Muscle Tone: within normal limits Gait & Station: normal Patient leans: N/A            Psychiatric Specialty Exam:  Presentation  General Appearance:  Fairly Groomed  Eye Contact: Minimal  Speech: Clear and Coherent  Speech Volume: Increased  Handedness: Right   Mood and Affect  Mood: Depressed; Dysphoric  Affect: Flat   Thought Process  Thought Processes: Coherent  Duration of Psychotic Symptoms: 60 days Past Diagnosis of Schizophrenia or Psychoactive disorder: No data recorded Descriptions of Associations:Loose  Orientation:Partial  Thought Content:Scattered  Hallucinations:Hallucinations: None  Ideas of Reference:None  Suicidal Thoughts:Suicidal Thoughts: No SI Passive Intent and/or Plan: -- (denies)  Homicidal Thoughts:Homicidal Thoughts: No   Sensorium  Memory: Immediate Good; Remote Fair  Judgment: Poor  Insight: Poor   Executive Functions  Concentration: Fair  Attention Span: Fair  Recall: Fair  Fund of Knowledge: Good  Language: Good   Psychomotor Activity  Psychomotor Activity: Psychomotor Activity: -- (uses walker)   Assets  Assets: Communication Skills   Sleep  Sleep: Sleep: Fair Number of Hours of Sleep: 5    Physical Exam: Physical Exam Vitals and nursing note reviewed.  Constitutional:      Appearance: Normal appearance.  HENT:     Head: Normocephalic and atraumatic.     Nose: Nose normal.  Pulmonary:     Effort: Pulmonary effort is normal.     Breath sounds: Normal  breath sounds.  Musculoskeletal:        General: Normal range of motion.     Cervical back: Normal range of motion.  Right lower leg: Edema present.     Left lower leg: Edema present.     Comments: Use walker for support  Neurological:     General: No focal deficit present.     Mental Status: She is alert. Mental status is at baseline.  Psychiatric:        Attention and Perception: Attention and perception normal.        Mood and Affect: Mood is anxious. Affect is blunt and flat.        Speech: Speech normal.        Behavior: Behavior normal. Behavior is cooperative.        Thought Content: Thought content normal.        Cognition and Memory: Cognition is impaired.        Judgment: Judgment normal.    ROS Blood pressure 108/65, pulse 92, temperature 98 F (36.7 C), temperature source Oral, resp. rate 16, height 5\' 4"  (1.626 m), weight 104.3 kg, SpO2 95%. Body mass index is 39.48 kg/m.  Treatment Plan Summary: Daily contact with patient to assess and evaluate symptoms and progress in treatment and Medication management Seroquel (quetiapine): Continue 25 mg BID and increase to 300 mg QHS to stabilize mood and address psychotic symptoms. Buspirone (BuSpar): Continue 7.5 mg BID for anxiety. Oxcarbazepine (Trileptal): Continue 300 mg BID for mood stabilization. Thiamine: Start 200 mg TID for suspected Wernicke's encephalopathy. Reassess mentation daily to monitor improvement or worsening. Benzodiazepine Taper Hydroxyzine (Vistaril) 25 mg PRN: To manage acute anxiety symptoms. continuation of Lyrica (Pregabalin)Monitor for symptoms of withdrawal, including rebound pain, anxiety, or insomnia. I certify that inpatient services furnis Observation Level/Precautions:  Continuous Observation Fall 15 minute checks Seizure  Laboratory:   none  Psychotherapy:    Medications:    Consultations:    Discharge Concerns:    Estimated LOS:  Other:     Physician Treatment Plan for Primary  Diagnosis: Schizoaffective disorder, bipolar type (HCC) Long Term Goal(s): Improvement in symptoms so as ready for discharge  Short Term Goals: Ability to identify changes in lifestyle to reduce recurrence of condition will improve, Ability to verbalize feelings will improve, Ability to disclose and discuss suicidal ideas, Ability to demonstrate self-control will improve, Ability to identify and develop effective coping behaviors will improve, Ability to maintain clinical measurements within normal limits will improve, Compliance with prescribed medications will improve, and Ability to identify triggers associated with substance abuse/mental health issues will improve  Physician Treatment Plan for Secondary Diagnosis: Principal Problem:   Schizoaffective disorder, bipolar type (HCC)  Long Term Goal(s): Improvement in symptoms so as ready for discharge  Short Term Goals: Ability to identify changes in lifestyle to reduce recurrence of condition will improve, Ability to verbalize feelings will improve, Ability to disclose and discuss suicidal ideas, Ability to demonstrate self-control will improve, Ability to identify and develop effective coping behaviors will improve, Ability to maintain clinical measurements within normal limits will improve, Compliance with prescribed medications will improve, and Ability to identify triggers associated with substance abuse/mental health issues will improve  I certify that inpatient services furnished can reasonably be expected to improve the patient's condition.    Myriam Forehand, NP 12/13/20245:20 PM

## 2023-02-05 NOTE — Group Note (Signed)
Date:  02/05/2023 Time:  5:44 PM  Group Topic/Focus:  Wellness Toolbox:   The focus of this group is to discuss various aspects of wellness, balancing those aspects and exploring ways to increase the ability to experience wellness.  Patients will create a wellness toolbox for use upon discharge.    Participation Level:  Active  Participation Quality:  Appropriate  Affect:  Appropriate  Cognitive:  Appropriate  Insight: Appropriate  Engagement in Group:  Engaged  Modes of Intervention:  Activity  Additional Comments:    Wilford Corner 02/05/2023, 5:44 PM

## 2023-02-05 NOTE — Progress Notes (Signed)
   02/05/23 1600  Spiritual Encounters  Type of Visit Follow up  Care provided to: Patient  Conversation partners present during encounter Nurse  Referral source Patient request  Reason for visit Religious ritual  OnCall Visit Yes   Chaplain escorted the Imam to meet with the patient in [facility]. Imam sat in the craft room with the patient and talked and prayed with her.

## 2023-02-05 NOTE — BHH Suicide Risk Assessment (Addendum)
[facility] Admission Suicide Risk Assessment   Nursing information obtained from:  Patient Demographic factors:  Divorced or widowed, Low socioeconomic status Current Mental Status:  Suicidal ideation indicated by patient Loss Factors:  Decline in physical health, Loss of significant relationship, Financial problems / change in socioeconomic status Historical Factors:  NA Risk Reduction Factors:  Positive social support (friend and brother)  Total Time spent with patient: 2.5 hours Principal Problem: Schizoaffective disorder, bipolar type (HCC) Diagnosis:  Principal Problem:   Schizoaffective disorder, bipolar type (HCC)  Subjective Data: 57 year old African American female admitted on 01/27/2023 for hypokalemia, bilateral lower extremity edema, and weakness. She reports feeling like she is in "crisis" and expresses ongoing passive suicidal ideation (SI) but denies having a specific plan. The patient endorses hearing voices, stating they tell her negative things such as, "You're ugly, black, and worthless." She also describes hearing a girl named "Tanja," whom she only hears but does not speak to.The patient mentions chronic challenges with mood stability and trauma-related symptoms, consistent with her psychiatric history of Bipolar Disorder and PTSD. She reports past suicide attempts in her 30s (cutting herself with a knife) and multiple psychiatric hospitalizations, most recently at [facility] in November 2024.She notes frustration with hospital staff and mistrust, stating that she felt "tricked" into seeing a female doctor. She also expressed concerns about specific psychiatric units, making statements about imagined risks such as "morphine in the attic that drugs patients."Collateral information from a friend at the bedside highlights concerns about the patient's memory, describing forgetfulness of events or statements within minutes. Nursing staff report waxing and waning mentation, with fluctuating  alertness and engagement throughout the day. The patient denies homicidal ideation (HI) and current active SI but continues to express chronic suicidal thoughts. She denies alcohol or substance use and reports no recent changes in her medication compliance. She describes feeling "ugly and worthless" but does not articulate clear depressive or manic symptoms during the interview due to disorganized thinking.  Continued Clinical Symptoms:  Alcohol Use Disorder Identification Test Final Score (AUDIT): 0 The "Alcohol Use Disorders Identification Test", Guidelines for Use in Primary Care, Second Edition.  World Science writer [facility]). Score between 0-7:  no or low risk or alcohol related problems. Score between 8-15:  moderate risk of alcohol related problems. Score between 16-19:  high risk of alcohol related problems. Score 20 or above:  warrants further diagnostic evaluation for alcohol dependence and treatment.   CLINICAL FACTORS:   Depression:   Hopelessness Insomnia Schizophrenia:   Paranoid or undifferentiated type Medical Diagnoses and Treatments/Surgeries   Musculoskeletal: Strength & Muscle Tone: within normal limits Gait & Station: normal Patient leans: N/A  Psychiatric Specialty Exam:  Presentation  General Appearance:  Fairly Groomed  Eye Contact: Minimal  Speech: Clear and Coherent  Speech Volume: Increased  Handedness: Right   Mood and Affect  Mood: Depressed; Dysphoric  Affect: Flat   Thought Process  Thought Processes: Coherent  Descriptions of Associations:Loose  Orientation:Partial  Thought Content:Scattered  History of Schizophrenia/Schizoaffective disorder:No data recorded Duration of Psychotic Symptoms:No data recorded Hallucinations:Hallucinations: None  Ideas of Reference:None  Suicidal Thoughts:Suicidal Thoughts: No SI Passive Intent and/or Plan: -- (denies)  Homicidal Thoughts:Homicidal Thoughts: No   Sensorium   Memory: Immediate Good; Remote Fair  Judgment: Poor  Insight: Poor   Executive Functions  Concentration: Fair  Attention Span: Fair  Recall: Fair  Fund of Knowledge: Good  Language: Good   Psychomotor Activity  Psychomotor Activity:Psychomotor Activity: -- (uses walker)  Assets  Assets: Communication Skills   Sleep  Sleep:Sleep: Fair Number of Hours of Sleep: 5    Physical Exam: Physical Exam Constitutional:      Appearance: Normal appearance.  HENT:     Head: Normocephalic and atraumatic.  Pulmonary:     Effort: Pulmonary effort is normal.  Musculoskeletal:     Cervical back: Normal range of motion.     Right lower leg: Edema present.     Left lower leg: Edema present.  Neurological:     General: No focal deficit present.     Mental Status: She is alert. Mental status is at baseline.  Psychiatric:        Attention and Perception: Attention and perception normal.        Mood and Affect: Mood is anxious and depressed. Affect is blunt and flat.        Speech: Speech normal.        Behavior: Behavior normal. Behavior is cooperative.        Thought Content: Thought content normal.        Cognition and Memory: Cognition is impaired. She exhibits impaired remote memory.        Judgment: Judgment normal.    Review of Systems  Cardiovascular:  Positive for leg swelling.  Psychiatric/Behavioral:  Positive for depression. The patient is nervous/anxious.   All other systems reviewed and are negative.  Blood pressure 108/65, pulse 92, temperature 98 F (36.7 C), temperature source Oral, resp. rate 16, height 5\' 4"  (1.626 m), weight 104.3 kg, SpO2 95%. Body mass index is 39.48 kg/m.   COGNITIVE FEATURES THAT CONTRIBUTE TO RISK:  None    SUICIDE RISK:   Minimal: No identifiable suicidal ideation.  Patients presenting with no risk factors but with morbid ruminations; may be classified as minimal risk based on the severity of the depressive  symptoms  PLAN OF CARE:  Seroquel (quetiapine): Continue 25 mg BID and increase to 300 mg QHS to stabilize mood and address psychotic symptoms. Buspirone (BuSpar): Continue 7.5 mg BID for anxiety. Oxcarbazepine (Trileptal): Continue 300 mg BID for mood stabilization. Thiamine: Start 200 mg TID for suspected Wernicke's encephalopathy. Reassess mentation daily to monitor improvement or worsening. Benzodiazepine Taper Hydroxyzine (Vistaril) 25 mg PRN: To manage acute anxiety symptoms. continuation of Lyrica (Pregabalin)Monitor for symptoms of withdrawal, including rebound pain, anxiety, or insomnia. I certify that inpatient services furnished can reasonably be expected to improve the patient's condition.   Myriam Forehand, NP 02/05/2023, 4:35 PM

## 2023-02-05 NOTE — Progress Notes (Signed)
   02/05/23 1200  Spiritual Encounters  Type of Visit Initial  Care provided to: Patient  Conversation partners present during encounter Social worker/Care management/TOC  Reason for visit Religious ritual  OnCall Visit Yes   Chaplain received a call from SW to bring patient a Q' ran and to call an Imam for her. Chaplain called Imam and he will come and visit the patient later this afternoon. Chaplain took patient a Q' ran. Chaplain services remain available for spiritual and emotional support.

## 2023-02-05 NOTE — Plan of Care (Signed)
Problem: Education: Goal: Knowledge of [facility] Education information/materials will improve Outcome: Not Progressing Goal: Emotional status will improve Outcome: Not Progressing Goal: Mental status will improve Outcome: Not Progressing Goal: Verbalization of understanding the information provided will improve Outcome: Not Progressing   Problem: Activity: Goal: Interest or engagement in activities will improve Outcome: Not Progressing Goal: Sleeping patterns will improve Outcome: Not Progressing   Problem: Coping: Goal: Ability to verbalize frustrations and anger appropriately will improve Outcome: Not Progressing Goal: Ability to demonstrate self-control will improve Outcome: Not Progressing   Problem: Health Behavior/Discharge Planning: Goal: Identification of resources available to assist in meeting health care needs will improve Outcome: Not Progressing Goal: Compliance with treatment plan for underlying cause of condition will improve Outcome: Not Progressing   Problem: Physical Regulation: Goal: Ability to maintain clinical measurements within normal limits will improve Outcome: Not Progressing   Problem: Safety: Goal: Periods of time without injury will increase Outcome: Not Progressing   Problem: Education: Goal: Ability to make informed decisions regarding treatment will improve Outcome: Not Progressing   Problem: Coping: Goal: Coping ability will improve Outcome: Not Progressing   Problem: Self-Concept: Goal: Ability to disclose and discuss suicidal ideas will improve Outcome: Not Progressing   Problem: Coping: Goal: Coping ability will improve Outcome: Not Progressing Goal: Will verbalize feelings Outcome: Not Progressing   Problem: Education: Goal: Will be free of psychotic symptoms Outcome: Not Progressing Goal: Knowledge of the prescribed therapeutic regimen will improve Outcome: Not Progressing
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
## 2023-02-05 NOTE — Group Note (Signed)
Date:  02/05/2023 Time:  11:03 AM  Group Topic/Focus:  Goals Group:   The focus of this group is to help patients establish daily goals to achieve during treatment and discuss how the patient can incorporate goal setting into their daily lives to aide in recovery. Wellness Toolbox:   The focus of this group is to discuss various aspects of wellness, balancing those aspects and exploring ways to increase the ability to experience wellness.  Patients will create a wellness toolbox for use upon discharge.    Participation Level:  Active  Participation Quality:  Appropriate  Affect:  Appropriate  Cognitive:  Appropriate  Insight: Appropriate  Engagement in Group:  Engaged  Modes of Intervention:  Discussion, Education, and Support  Additional Comments:    Wilford Corner 02/05/2023, 11:03 AM

## 2023-02-05 NOTE — Group Note (Signed)
Date:  02/05/2023 Time:  8:53 PM  Group Topic/Focus:  Wrap-Up Group:   The focus of this group is to help patients review their daily goal of treatment and discuss progress on daily workbooks.    Participation Level:  Did Not Attend   Shannon [name] 02/05/2023, 8:53 PM

## 2023-02-06 DIAGNOSIS — M25472 Effusion, left ankle: Secondary | ICD-10-CM

## 2023-02-06 DIAGNOSIS — G5793 Unspecified mononeuropathy of bilateral lower limbs: Secondary | ICD-10-CM | POA: Diagnosis not present

## 2023-02-06 DIAGNOSIS — F250 Schizoaffective disorder, bipolar type: Secondary | ICD-10-CM

## 2023-02-06 DIAGNOSIS — M25471 Effusion, right ankle: Secondary | ICD-10-CM

## 2023-02-06 MED ORDER — GABAPENTIN 300 MG PO CAPS
600.0000 mg | ORAL_CAPSULE | Freq: Three times a day (TID) | ORAL | Status: DC
  Administered 2023-02-06 – 2023-02-10 (×13): 600 mg via ORAL
  Filled 2023-02-06 (×12): qty 2

## 2023-02-06 MED ORDER — QUETIAPINE FUMARATE 200 MG PO TABS
400.0000 mg | ORAL_TABLET | Freq: Every day | ORAL | Status: DC
  Administered 2023-02-06 – 2023-02-09 (×3): 400 mg via ORAL
  Filled 2023-02-06 (×4): qty 2

## 2023-02-06 MED ORDER — PREGABALIN 75 MG PO CAPS
75.0000 mg | ORAL_CAPSULE | Freq: Two times a day (BID) | ORAL | Status: DC
  Administered 2023-02-07 – 2023-02-10 (×7): 75 mg via ORAL
  Filled 2023-02-06 (×7): qty 1

## 2023-02-06 MED ORDER — POTASSIUM CHLORIDE CRYS ER 20 MEQ PO TBCR
20.0000 meq | EXTENDED_RELEASE_TABLET | Freq: Every day | ORAL | Status: DC
  Administered 2023-02-06 – 2023-02-10 (×5): 20 meq via ORAL
  Filled 2023-02-06 (×4): qty 1

## 2023-02-06 NOTE — Plan of Care (Signed)
  Problem: Education: Goal: Verbalization of understanding the information provided will improve Outcome: Progressing   Problem: Education: Goal: Mental status will improve Outcome: Progressing   Problem: Education: Goal: Emotional status will improve Outcome: Progressing

## 2023-02-06 NOTE — Group Note (Signed)
Date:  02/06/2023 Time:  10:09 PM  Group Topic/Focus:  Wrap-Up Group:   The focus of this group is to help patients review their daily goal of treatment and discuss progress on daily workbooks.    Participation Level:  Did Not Attend  02/06/2023, 10:09 PM

## 2023-02-06 NOTE — Group Note (Signed)
Date:  02/06/2023 Time:  1:09 PM  Group Topic/Focus:  Conflict Resolution:   The focus of this group is to discuss the conflict resolution process and how it may be used upon discharge. Emotional Education:   The focus of this group is to discuss what feelings/emotions are, and how they are experienced. Managing Feelings:   The focus of this group is to identify what feelings patients have difficulty handling and develop a plan to handle them in a healthier way upon discharge.    Participation Level:  Active  Participation Quality:  Appropriate  Affect:  Appropriate  Cognitive:  Appropriate  Insight: Appropriate  Engagement in Group:  Engaged  Modes of Intervention:  Activity  Additional Comments:    Markies Mowatt 02/06/2023, 1:09 PM

## 2023-02-06 NOTE — Progress Notes (Signed)
[facility] MD Progress Note  02/06/2023 2:22 PM Shannon [name]  MRN:  [id number] Subjective:  The patient is a 57 year old female with a history of schizoaffective disorder, depression, and anxiety. She reports persistent feelings of worthlessness and depression, accompanied by auditory hallucinations that are telling her to kill herself, that she is worthless, and questioning why she is still living. The patient admits to passive suicidal ideation but denies any specific plan or intent. She expresses a desire to find a stable shelter and improve her management of her psychiatric illness. She mentioned previously speaking with an imam who provided her with a contact number for a Muslim women's shelter, but she lost the number. Principal Problem: Schizoaffective disorder, bipolar type (HCC) Diagnosis: Principal Problem:   Schizoaffective disorder, bipolar type (HCC)  Total Time spent with patient: 1 hour  Past Psychiatric History: Schizoaffective disorder, depression, anxiety, polysubstance abuse.  Past Medical History:  Past Medical History:  Diagnosis Date   Allergic rhinitis    Allergic rhinitis 01/25/2009        ANEMIA-IRON DEFICIENCY 01/25/2009   Anxiety    Backache 01/25/2009   Qualifier: Diagnosis of  By: Jonny Ruiz MD, Len Blalock    Bipolar 1 disorder [facility])    Chronic pain syndrome    Complete rotator cuff tear 07/21/2013   Complete tear of right rotator cuff 07/21/2013   Contact lens/glasses fitting    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   DISC DISEASE, LUMBAR 01/25/2009   Essential hypertension 01/25/2009   On Bystolic    GAD (generalized anxiety disorder) 12/28/2013   GERD (gastroesophageal reflux disease)    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   Headache, acute 12/20/2013   Hx of laparoscopic gastric banding 09/03/2010   Surgery date: 07/17/10    HYPERLIPIDEMIA 01/25/2009   Hyperlipidemia with target LDL less than 130 01/25/2009   HYPERTENSION 01/25/2009   Hypokalemia, inadequate  intake 09/01/2011   Insomnia 04/04/2011   INSOMNIA-SLEEP DISORDER-UNSPEC 04/30/2009   Qualifier: Diagnosis of  By: Jonny Ruiz MD, Len Blalock    Iron deficiency anemia 01/25/2009        Labyrinthitis 02/19/2014   Leiomyoma of uterus, unspecified 08/13/2008   Overview:  Leiomyoma Of The Uterus  10/1 IMO update   Localized edema 06/12/2015   MANIC DEPRESSIVE ILLNESS 01/25/2009   pt is unsue if this is her specifc dx   Mixed bipolar I disorder (HCC) 07/12/2012   Morbid obesity (HCC) 01/25/2009   Night sweats    Osteopenia determined by x-ray 06/13/2021   Other abnormal glucose 10/26/2012   Palpitations 10/25/2013   PEPTIC ULCER DISEASE, HELICOBACTER PYLORI POSITIVE 10/03/2009   Peripheral edema 06/25/2015   Pernicious anemia 03/28/2012   Piriformis syndrome of both sides 09/02/2018   PUD (peptic ulcer disease) 10/03/2009        Right shoulder pain 05/03/2013   Dg Shoulder Right  05/03/2013   CLINICAL DATA Pain.  EXAM RIGHT SHOULDER - 2+ VIEW  COMPARISON None.  FINDINGS Acromioclavicular and glenohumeral degenerative change present. Questionable calcific density noted in the region of the supraspinatus space, possibly representing calcific supraspinatus tendinitis. This could represent a sclerotic density in the acromion. MRI of the right shoulder suggest   Sleep apnea 08/12/2016   SMOKER 01/25/2009   Qualifier: Diagnosis of  By: Jonny Ruiz MD, Len Blalock    Subacromial bursitis 05/17/2013   SUBSTANCE ABUSE 11/06/2009        Thiamin deficiency 10/30/2012   Tobacco abuse 06/09/2010   Visual disturbance 05/03/2013   Vitamin D  deficiency 10/27/2012    Past Surgical History:  Procedure Laterality Date   ABDOMINAL HYSTERECTOMY     BLADDER SURGERY     s/p with ?diverticulitis   HAND TENDON SURGERY  1991   s/p-Right-index and middle   LAPAROSCOPIC GASTRIC BAND REMOVAL WITH LAPAROSCOPIC GASTRIC SLEEVE RESECTION N/A 08/17/2016   Procedure: LAPAROSCOPIC GASTRIC BAND REMOVAL WITH LAPAROSCOPIC GASTRIC SLEEVE RESECTION WITH UPPER ENDO;   Surgeon: Ovidio Kin, MD;  Location: [facility];  Service: General;  Laterality: N/A;   LAPAROSCOPIC GASTRIC BANDING  07/14/10   SHOULDER ARTHROSCOPY Right 07/21/2013   Procedure: RIGHT ARTHROSCOPY SHOULDER DEBRIDMENT EXTENTSIVE,ARTHROSCOPIC REMOVE LOOSE FOREIGN BODY, BICEPS TENOLYSIS ;  Surgeon: Eulas Post, MD;  Location: [facility];  Service: Orthopedics;  Laterality: Right;   TUBAL LIGATION     Family History:  Family History  Problem Relation Age of Onset   Hypertension Mother    Stroke Father    Cirrhosis Father        ETOH   Hypertension Father    Diabetes Father    Alcohol abuse Father    Hypertension Sister    Asthma Sister    Hypertension Brother    Hypertension Paternal Aunt    Diabetes Maternal Grandmother    ADD / ADHD Son    ADD / ADHD Other        3 nephews, also emotional issues   Diabetes Other        Uncle   Diabetes Other        Aunt   Suicidality Neg Hx    Family Psychiatric  History: See above Social History:  Social History   Substance and Sexual Activity  Alcohol Use No   Alcohol/week: 0.0 standard drinks of alcohol     Social History   Substance and Sexual Activity  Drug Use No    Social History   Socioeconomic History   Marital status: Divorced    Spouse name: Not on file   Number of children: 2   Years of education: Not on file   Highest education level: Professional school degree (e.g., MD, DDS, DVM, JD)  Occupational History   Occupation: STUDENT    Employer: UNEMPLOYED  Tobacco Use   Smoking status: Every Day    Current packs/day: 0.25    Average packs/day: 0.3 packs/day for 20.0 years (5.0 ttl pk-yrs)    Types: Cigarettes   Smokeless tobacco: Never   Tobacco comments:    States has cut back to 5-7 a day and working on this.   Vaping Use   Vaping status: Never Used  Substance and Sexual Activity   Alcohol use: No    Alcohol/week: 0.0 standard drinks of alcohol   Drug use: No   Sexual activity: Not  Currently  Other Topics Concern   Not on file  Social History Narrative   Moved from [place], then [place] to [place] 2009   2 children-1 boy, 1 girl   Disabled-bipolar   Daily Caffeine Use-1 cup/day      Right Handed   Lives in 2nd floor condo   Social Drivers of Health   Financial Resource Strain: High Risk (01/01/2023)   Overall Financial Resource Strain (CARDIA)    Difficulty of Paying Living Expenses: Very hard  Food Insecurity: No Food Insecurity (02/04/2023)   Hunger Vital Sign    Worried About Running Out of Food in the Last Year: Never true    Ran Out of Food in the Last Year: Never  true  Recent Concern: Food Insecurity - Food Insecurity Present (01/28/2023)   Hunger Vital Sign    Worried About Running Out of Food in the Last Year: Sometimes true    Ran Out of Food in the Last Year: Sometimes true  Transportation Needs: No Transportation Needs (02/04/2023)   PRAPARE - Administrator, Civil Service (Medical): No    Lack of Transportation (Non-Medical): No  Physical Activity: Inactive (01/01/2023)   Exercise Vital Sign    Days of Exercise per Week: 0 days    Minutes of Exercise per Session: 0 min  Stress: Stress Concern Present (01/01/2023)   [employer] of Occupational Health - Occupational Stress Questionnaire    Feeling of Stress : Very much  Social Connections: Socially Isolated (01/01/2023)   Social Connection and Isolation Panel [NHANES]    Frequency of Communication with Friends and Family: Never    Frequency of Social Gatherings with Friends and Family: Never    Attends Religious Services: 1 to 4 times per year    Active Member of [employer] or Organizations: No    Attends Engineer, structural: Not on file    Marital Status: Separated   Additional Social History:                         Sleep: Fair  Appetite:  Good  Current Medications: Current Facility-Administered Medications  Medication Dose Route Frequency Provider  Last Rate Last Admin   acetaminophen (TYLENOL) tablet 650 mg  650 mg Oral Q6H PRN Starkes-Perry, Juel Burrow, FNP       alum & mag hydroxide-simeth (MAALOX/MYLANTA) 200-200-20 MG/5ML suspension 30 mL  30 mL Oral Q4H PRN Starkes-Perry, Juel Burrow, FNP       busPIRone (BUSPAR) tablet 10 mg  10 mg Oral BID Myriam Forehand, NP   10 mg at 02/06/23 0837   [START ON 02/08/2023] clonazepam (KLONOPIN) disintegrating tablet 0.125 mg  0.125 mg Oral BID Myriam Forehand, NP       clonazePAM Scarlette Calico) disintegrating tablet 0.25 mg  0.25 mg Oral BID Myriam Forehand, NP       clonazePAM Scarlette Calico) tablet 0.5 mg  0.5 mg Oral BID PRN Maryagnes Amos, FNP   0.5 mg at 02/06/23 3244   cyanocobalamin (VITAMIN B12) tablet 1,000 mcg  1,000 mcg Oral Daily Maryagnes Amos, FNP   1,000 mcg at 02/06/23 0102   haloperidol (HALDOL) tablet 5 mg  5 mg Oral TID PRN Maryagnes Amos, FNP   5 mg at 02/05/23 1252   And   diphenhydrAMINE (BENADRYL) capsule 50 mg  50 mg Oral TID PRN Maryagnes Amos, FNP   50 mg at 02/05/23 1252   feeding supplement (ENSURE ENLIVE / ENSURE PLUS) liquid 237 mL  237 mL Oral BID BM Starkes-Perry, Juel Burrow, FNP       gabapentin (NEURONTIN) capsule 600 mg  600 mg Oral TID Myriam Forehand, NP       hydrochlorothiazide (HYDRODIURIL) tablet 6.25 mg  6.25 mg Oral Daily Maryagnes Amos, FNP   6.25 mg at 02/06/23 7253   hydrOXYzine (ATARAX) tablet 50 mg  50 mg Oral QHS Maryagnes Amos, FNP   50 mg at 02/05/23 2107   magnesium hydroxide (MILK OF MAGNESIA) suspension 30 mL  30 mL Oral Daily PRN Maryagnes Amos, FNP   30 mL at 02/04/23 1825   multivitamin with minerals tablet 1 tablet  1 tablet  Oral BID WC Maryagnes Amos, FNP   1 tablet at 02/06/23 6213   nicotine polacrilex (NICORETTE) gum 2 mg  2 mg Oral PRN Maryagnes Amos, FNP       Oxcarbazepine (TRILEPTAL) tablet 300 mg  300 mg Oral BID Maryagnes Amos, FNP   300 mg at 02/06/23 0839   pantoprazole  (PROTONIX) EC tablet 40 mg  40 mg Oral BID AC Maryagnes Amos, FNP   40 mg at 02/06/23 0837   polyethylene glycol (MIRALAX / GLYCOLAX) packet 17 g  17 g Oral Daily Maryagnes Amos, FNP   17 g at 02/06/23 0836   [START ON 02/07/2023] pregabalin (LYRICA) capsule 25 mg  25 mg Oral BID Myriam Forehand, NP       Melene Muller ON 02/08/2023] pregabalin (LYRICA) capsule 25 mg  25 mg Oral Once Myriam Forehand, NP       pregabalin (LYRICA) capsule 50 mg  50 mg Oral BID Myriam Forehand, NP   50 mg at 02/06/23 0865   QUEtiapine (SEROQUEL) tablet 25 mg  25 mg Oral BID Maryagnes Amos, FNP   25 mg at 02/06/23 0836   QUEtiapine (SEROQUEL) tablet 400 mg  400 mg Oral QHS Myriam Forehand, NP       simethicone (MYLICON) 40 MG/0.6ML suspension 40 mg  40 mg Oral QID PRN Maryagnes Amos, FNP       topiramate (TOPAMAX) tablet 50 mg  50 mg Oral BID Myriam Forehand, NP   50 mg at 02/06/23 7846   traMADol (ULTRAM) tablet 100 mg  100 mg Oral Q6H PRN Myriam Forehand, NP   100 mg at 02/06/23 9629    Lab Results: No results found. However, due to the size of the patient record, not all encounters were searched. Please check Results Review for a complete set of results.  Blood Alcohol level:  Lab Results  Component Value Date   [facility] <10 01/01/2023   ETH <11 07/11/2012    Metabolic Disorder Labs: Lab Results  Component Value Date   HGBA1C 5.5 01/29/2023   MPG 111.15 01/29/2023   No results found for: "PROLACTIN" Lab Results  Component Value Date   CHOL 244 (H) 05/14/2021   TRIG 164.0 (H) 05/14/2021   HDL 51.60 05/14/2021   CHOLHDL 5 05/14/2021   VLDL 32.8 05/14/2021   LDLCALC 160 (H) 05/14/2021   LDLCALC 180 (H) 11/22/2019    Physical Findings: AIMS:  , ,  ,  ,    CIWA:    COWS:     Musculoskeletal: Strength & Muscle Tone: within normal limits Gait & Station: normal Patient leans: N/A  Psychiatric Specialty Exam:  Presentation  General Appearance:  Fairly Groomed  Eye  Contact: Minimal  Speech: Clear and Coherent  Speech Volume: Increased  Handedness: Right   Mood and Affect  Mood: Depressed; Dysphoric  Affect: Flat   Thought Process  Thought Processes: Coherent  Descriptions of Associations:Loose  Orientation:Partial  Thought Content:Scattered  History of Schizophrenia/Schizoaffective disorder: YES Duration of Psychotic Symptoms: 60 DAYS Hallucinations:Hallucinations: None  Ideas of Reference:None  Suicidal Thoughts:Suicidal Thoughts: No SI Passive Intent and/or Plan: -- (denies)  Homicidal Thoughts:Homicidal Thoughts: No   Sensorium  Memory: Immediate Good; Remote Fair  Judgment: Poor  Insight: Poor   Executive Functions  Concentration: Fair  Attention Span: Fair  Recall: Fair  Fund of Knowledge: Good  Language: Good   Psychomotor Activity  Psychomotor Activity: Psychomotor Activity: -- (uses  walker)   Assets  Assets: Communication Skills   Sleep  Sleep: Sleep: Fair Number of Hours of Sleep: 5    Physical Exam: Physical Exam Vitals and nursing note reviewed.  Constitutional:      Appearance: Normal appearance.  HENT:     Head: Normocephalic and atraumatic.     Nose: Nose normal.  Pulmonary:     Effort: Pulmonary effort is normal.  Musculoskeletal:     Cervical back: Normal range of motion.     Right lower leg: Edema present.     Left lower leg: Edema present.  Neurological:     General: No focal deficit present.     Mental Status: She is alert and oriented to person, place, and time. Mental status is at baseline.  Psychiatric:        Attention and Perception: Attention normal.        Mood and Affect: Mood is anxious and depressed. Affect is tearful.        Speech: Speech normal.        Behavior: Behavior is cooperative.        Thought Content: Thought content is paranoid. Thought content includes suicidal ideation.        Cognition and Memory: Cognition and memory  normal.        Judgment: Judgment normal.    Review of Systems  Psychiatric/Behavioral:  Positive for depression, hallucinations, substance abuse and suicidal ideas. The patient is nervous/anxious.   All other systems reviewed and are negative.  Blood pressure 136/67, pulse 98, temperature 98.4 F (36.9 C), resp. rate (!) 21, height 5\' 4"  (1.626 m), weight 104.3 kg, SpO2 95%. Body mass index is 39.48 kg/m.   Treatment Plan Summary: increase Seroquel (quetiapine) to 400 mg at bedtime to target psychotic and depressive symptoms. Increase Gabapentin to 600 mg three times daily to address neuropathic pain and discomfort. Notify the hospitalist to evaluate bilateral LE edema and neuropathy, and assess for potential underlying causes (e.g., heart, kidney, or medication-related).  Myriam Forehand, NP 02/06/2023, 2:22 PM

## 2023-02-06 NOTE — Progress Notes (Signed)
   02/06/23 1000  Psych Admission Type (Psych Patients Only)  Admission Status Voluntary  Psychosocial Assessment  Patient Complaints Anxiety  Eye Contact Brief  Facial Expression Anxious  Affect Anxious  Speech Logical/coherent  Interaction Assertive  Motor Activity Slow  Appearance/Hygiene Layered clothes  Behavior Characteristics Cooperative  Aggressive Behavior  Effect No apparent injury  Thought Process  Coherency WDL  Content WDL  Delusions WDL  Perception Hallucinations  Hallucination Auditory  Judgment WDL  Confusion Mild  Danger to Self  Current suicidal ideation? Passive  Self-Injurious Behavior Some self-injurious ideation observed or expressed.  No lethal plan expressed   Agreement Not to Harm Self Yes  Description of Agreement Verbal  Danger to Others  Danger to Others None reported or observed   8:20: Pt. Remains stable without changes.

## 2023-02-06 NOTE — Progress Notes (Signed)
Patient alert and oriented x 4, affect is blunted she appear irritable and angry at staff, she denies SI/HI/AVH forwards very little and does not make eye contact. Patient was offered emotional support, 15 minutes safety checks maintained

## 2023-02-06 NOTE — Consult Note (Signed)
Initial Consultation Note   Patient: Shannon [name]:[id number] DOB: [birth year] PCP: Avanell Shackleton, NP-C DOA: 02/04/2023 DOS: the patient was seen and examined on 02/06/2023 Primary service: Lewanda Rife, MD  Referring physician: Keith Rake NP Reason for consult: Worsening ankle edema  Brief history/reason for consult:  Patient is a 57 year old African-American female with psychiatric history of bipolar disorder and PTSD who was currently admitted to the inpatient behavioral health service on 12/13 for management of chronic mood instability, auditory hallucinations, occasional passive SI.  Also per psychiatric documentation patient reports waxing and waning mentation associated with disorganized thought process.  On 12/14 [facility] hospitalist service received consult.  Reason for consult evaluation of bilateral ankle edema that is reported to be worsening.  Patient does have a medical history notable for history of gastric sleeve surgery, hypertension, peripheral neuropathy, morbid obesity.  Patient was recently admitted to [facility] long hospital on 01/27/2023.  Primary reason for that admission was 2 weeks of worsening swelling of bilateral lower extremities associated with worsening neuropathic pain.  Patient had previously been started on loop diuretics with Lasix with minimal improvement.  She was discharged from [facility] on 02/04/2023 and transferred to [facility] inpatient psychiatric facility  On my evaluation patient is sitting up in chair.  She is in no visible distress.  She do endorse worsening of her bilateral lower extremity edema.es per medical documentation and laboratory data on 12/12 within normal limits.  No major electrolyte derangements.  Previous workup did find hypoalbuminemia.  Working diagnosis on the previous medical admission was multifactorial lower extremity edema suspected due to chronic venous insufficiency and hypoalbuminemia.  Echocardiogram with moderate AI,  grade 1 diastolic dysfunction.   Assessment and Plan: No notes have been filed under this hospital service. Service: Hospitalist  Bilateral lower extremity edema Bilateral lower extremity neuropathy  I agree with discharge summary from my partner at [facility] long hospital on 12/12.  Edema likely multifactorial related to chronic venous insufficiency and hypoalbuminemia in the setting of morbid obesity with relatively recent gastric sleeve surgery.  Unlikely to represent congestive heart failure as the edema seems to be focused around her ankles without extension up into the mid thigh.  Patient does have a history of fatty infiltration of the liver but is not obviously cirrhotic making hepatic etiology for edema less likely.  Patient does endorse worsening neuropathic pain not adequately controlled by current dose of gabapentin.  Recommendations:  - As patient did not respond to loop diuretics and she has a history of hypokalemia I do not recommend Lasix or other loop diuretic at this time.  -Given her extensive medical workup very recently at [facility] long I do not recommend any further diagnostic workup at this time  -If the patient can tolerate and we have compression stockings in size that will fit her given her body habitus can attempt nonpharmacologic methods with extra-large knee-high compression stockings.  Also recommended leg elevation when seated.  Bilateral TED hose have been ordered  -Recommend increase protein intake in diet to assist with correction of hypoalbuminemia.  Registered dietitian consult has been placed  -Patient is on both gabapentin and pregabalin.  The doses of these medications is not optimized.  I will increase the dose of pregabalin to 75 mg twice daily per last discharge medication reconciliation.  Patient is currently on 600 mg gabapentin 3 times daily.  If the neuropathic pain is not improved by the increased dose of pregabalin consider increasing the dose of  gabapentin  -  Continue home hydrochlorothiazide at dose of 6.25 mg daily.  Will add potassium chloride 20 mill equivalents daily to optimize serum potassium levels  -No further inpatient medical workup recommended for evaluation of bilateral ankle edema.  Once optimized from a psychiatric standpoint suggest follow-up with primary care physician.  Thank you for allowing [name] to participate in the care of your patient.   [facility] hospitalist service to sign off at this time.    [facility] will sign off at present, please call us again when needed.   Review of Systems: As mentioned in the history of present illness. All other systems reviewed and are negative. Past Medical History:  Diagnosis Date   Allergic rhinitis    Allergic rhinitis 01/25/2009        ANEMIA-IRON DEFICIENCY 01/25/2009   Anxiety    Backache 01/25/2009   Qualifier: Diagnosis of  By: Jonny Ruiz MD, Len Blalock    Bipolar 1 disorder [facility])    Chronic pain syndrome    Complete rotator cuff tear 07/21/2013   Complete tear of right rotator cuff 07/21/2013   Contact lens/glasses fitting    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   DISC DISEASE, LUMBAR 01/25/2009   Essential hypertension 01/25/2009   On Bystolic    GAD (generalized anxiety disorder) 12/28/2013   GERD (gastroesophageal reflux disease)    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   Headache, acute 12/20/2013   Hx of laparoscopic gastric banding 09/03/2010   Surgery date: 07/17/10    HYPERLIPIDEMIA 01/25/2009   Hyperlipidemia with target LDL less than 130 01/25/2009   HYPERTENSION 01/25/2009   Hypokalemia, inadequate intake 09/01/2011   Insomnia 04/04/2011   INSOMNIA-SLEEP DISORDER-UNSPEC 04/30/2009   Qualifier: Diagnosis of  By: Jonny Ruiz MD, Len Blalock    Iron deficiency anemia 01/25/2009        Labyrinthitis 02/19/2014   Leiomyoma of uterus, unspecified 08/13/2008   Overview:  Leiomyoma Of The Uterus  10/1 IMO update   Localized edema 06/12/2015   MANIC DEPRESSIVE ILLNESS 01/25/2009   pt is  unsue if this is her specifc dx   Mixed bipolar I disorder (HCC) 07/12/2012   Morbid obesity (HCC) 01/25/2009   Night sweats    Osteopenia determined by x-ray 06/13/2021   Other abnormal glucose 10/26/2012   Palpitations 10/25/2013   PEPTIC ULCER DISEASE, HELICOBACTER PYLORI POSITIVE 10/03/2009   Peripheral edema 06/25/2015   Pernicious anemia 03/28/2012   Piriformis syndrome of both sides 09/02/2018   PUD (peptic ulcer disease) 10/03/2009        Right shoulder pain 05/03/2013   Dg Shoulder Right  05/03/2013   CLINICAL DATA Pain.  EXAM RIGHT SHOULDER - 2+ VIEW  COMPARISON None.  FINDINGS Acromioclavicular and glenohumeral degenerative change present. Questionable calcific density noted in the region of the supraspinatus space, possibly representing calcific supraspinatus tendinitis. This could represent a sclerotic density in the acromion. MRI of the right shoulder suggest   Sleep apnea 08/12/2016   SMOKER 01/25/2009   Qualifier: Diagnosis of  By: Jonny Ruiz MD, Len Blalock    Subacromial bursitis 05/17/2013   SUBSTANCE ABUSE 11/06/2009        Thiamin deficiency 10/30/2012   Tobacco abuse 06/09/2010   Visual disturbance 05/03/2013   Vitamin D deficiency 10/27/2012   Past Surgical History:  Procedure Laterality Date   ABDOMINAL HYSTERECTOMY     BLADDER SURGERY     s/p with ?diverticulitis   HAND TENDON SURGERY  1991   s/p-Right-index and middle   LAPAROSCOPIC GASTRIC BAND REMOVAL WITH LAPAROSCOPIC  GASTRIC SLEEVE RESECTION N/A 08/17/2016   Procedure: LAPAROSCOPIC GASTRIC BAND REMOVAL WITH LAPAROSCOPIC GASTRIC SLEEVE RESECTION WITH UPPER ENDO;  Surgeon: Ovidio Kin, MD;  Location: [facility];  Service: General;  Laterality: N/A;   LAPAROSCOPIC GASTRIC BANDING  07/14/10   SHOULDER ARTHROSCOPY Right 07/21/2013   Procedure: RIGHT ARTHROSCOPY SHOULDER DEBRIDMENT EXTENTSIVE,ARTHROSCOPIC REMOVE LOOSE FOREIGN BODY, BICEPS TENOLYSIS ;  Surgeon: Eulas Post, MD;  Location: [facility];  Service: Orthopedics;   Laterality: Right;   TUBAL LIGATION     Social History:  reports that she has been smoking cigarettes. She has a 5 pack-year smoking history. She has never used smokeless tobacco. She reports that she does not drink alcohol and does not use drugs.  Allergies  Allergen Reactions   Pork-Derived Products Other (See Comments) and Anaphylaxis    Religious Reasons (Muslim)   Lamictal [Lamotrigine] Rash    Family History  Problem Relation Age of Onset   Hypertension Mother    Stroke Father    Cirrhosis Father        ETOH   Hypertension Father    Diabetes Father    Alcohol abuse Father    Hypertension Sister    Asthma Sister    Hypertension Brother    Hypertension Paternal Aunt    Diabetes Maternal Grandmother    ADD / ADHD Son    ADD / ADHD Other        3 nephews, also emotional issues   Diabetes Other        Uncle   Diabetes Other        Aunt   Suicidality Neg Hx     Prior to Admission medications   Medication Sig Start Date End Date Taking? Authorizing Provider  atorvastatin (LIPITOR) 20 MG tablet TAKE ONE TABLET BY MOUTH ONCE DAILY FOR CHOLESTEROL Patient taking differently: Take 20 mg by mouth daily. 09/10/22   Henson, [name], NP-C  busPIRone (BUSPAR) 7.5 MG tablet Take 7.5 mg by mouth 2 (two) times daily.    [provider]  clonazePAM (KLONOPIN) 0.5 MG tablet Take 1 tablet (0.5 mg total) by mouth 2 (two) times daily as needed for anxiety. 11/12/22   Arfeen, Phillips Grout, MD  cyanocobalamin 1000 MCG tablet Take 1 tablet (1,000 mcg total) by mouth daily. 02/05/23   Regalado, Belkys A, MD  gabapentin (NEURONTIN) 300 MG capsule Take 1 capsule (300 mg total) by mouth 3 (three) times daily. 02/04/23   Regalado, Belkys A, MD  hydrochlorothiazide (HYDRODIURIL) 12.5 MG tablet Take 0.5 tablets (6.25 mg total) by mouth daily. 02/05/23   Regalado, Belkys A, MD  hydrOXYzine (ATARAX) 50 MG tablet Take 50 mg by mouth at bedtime.    [provider]  Multiple Vitamins-Minerals  (BARIATRIC MULTIVITAMINS/IRON) CAPS Take 1 capsule by mouth daily. 01/14/22   Henson, [name], NP-C  Oxcarbazepine (TRILEPTAL) 300 MG tablet Take 300 mg by mouth 2 (two) times daily.    [provider]  pantoprazole (PROTONIX) 40 MG tablet Take 1 tablet (40 mg total) by mouth 2 (two) times daily before a meal. 01/27/22   Henson, [name], NP-C  potassium chloride SA (KLOR-CON M) 20 MEQ tablet Take 1 tablet (20 mEq total) by mouth daily. 02/04/23 03/06/23  Regalado, Belkys A, MD  pregabalin (LYRICA) 75 MG capsule Take 1 capsule (75 mg total) by mouth 2 (two) times daily. 02/04/23   Regalado, Belkys A, MD  QUEtiapine (SEROQUEL) 25 MG tablet Take 1 tablet (25 mg total) by  mouth 2 (two) times daily. 11/12/22 02/10/23  Arfeen, Phillips Grout, MD  QUEtiapine (SEROQUEL) 300 MG tablet Take 1 tablet (300 mg total) by mouth at bedtime. 11/12/22 02/10/23  Arfeen, Phillips Grout, MD  thiamine (VITAMIN B1) 100 MG tablet Take 1 tablet (100 mg total) by mouth daily. 02/04/23   Alba Cory, MD    Physical Exam: Vitals:   02/05/23 0618 02/05/23 0750 02/05/23 1735 02/06/23 0646  BP: (!) 101/56 108/65 (!) 155/91 136/67  Pulse: 94 92 (!) 104 98  Resp: 16   (!) 21  Temp: 98 F (36.7 C)   98.4 F (36.9 C)  TempSrc: Oral     SpO2: 95%  98% 95%  Weight:      Height:       General: No apparent distress, patient appears well HEENT: Normocephalic, atraumatic Neck, supple, trachea midline, no tenderness Heart: Regular rate and rhythm, S1/S2 normal, no murmurs Lungs: Lungs clear.  Normal work of breathing.  Room air Abdomen: Obese, soft, NT/ND, normal bowel sounds Extremities: Bilateral lower extremity edema extending up to the level of mid shin Skin: No rashes or lesions, normal color Neurologic: Cranial nerves grossly intact, sensation intact, alert and oriented x3 Psychiatric: Normal affect  Data Reviewed:   There are no new results to review at this time.    Thank you very much for involving [name] in the  care of your patient.  Author: Tresa Moore, MD 02/06/2023 4:11 PM  For on call review [email or web address].

## 2023-02-07 NOTE — [facility] Suicide Risk Assessment (Signed)
[facility] INPATIENT:  Family/Significant Other Suicide Prevention Education  Suicide Prevention Education:  Contact Attempts: Shannon [name], Shannon [name], [phone], has been identified by the patient as the family member/significant other with whom the patient will be residing, and identified as the person(s) who will aid the patient in the event of a mental health crisis.  With written consent from the patient, two attempts were made to provide suicide prevention education, prior to and/or following the patient's discharge.  We were unsuccessful in providing suicide prevention education.  A suicide education pamphlet was given to the patient to share with family/significant other.  Date and time of first attempt:02/05/2023 1:43PM  Date and time of second attempt:02/07/23 /1:15 pm  [name] 02/07/2023, 1:17 PM

## 2023-02-07 NOTE — Group Note (Signed)
Date:  02/07/2023 Time:  11:46 AM  Group Topic/Focus:  Spirituality:   The focus of this group is to discuss how one's spirituality can aide in recovery.    Participation Level:  Active  Participation Quality:  Appropriate  Affect:  Appropriate  Cognitive:  Appropriate  Insight: Appropriate  Engagement in Group:  Engaged  Modes of Intervention:  Activity  Additional Comments:    Mary Sella Nollan Muldrow 02/07/2023, 11:46 AM

## 2023-02-07 NOTE — Progress Notes (Signed)
   02/07/23 1000  Psych Admission Type (Psych Patients Only)  Admission Status Voluntary  Psychosocial Assessment  Patient Complaints Anxiety;Depression;Self-harm thoughts  Eye Contact Fair  Facial Expression Worried  Affect Appropriate to circumstance  Speech Logical/coherent  Interaction Assertive  Motor Activity Slow  Appearance/Hygiene Layered clothes;Unremarkable  Behavior Characteristics Cooperative;Appropriate to situation  Mood Pleasant  Aggressive Behavior  Effect No apparent injury  Thought Process  Coherency WDL  Content WDL  Delusions None reported or observed  Perception Hallucinations  Hallucination Auditory  Judgment WDL  Confusion Mild  Danger to Self  Current suicidal ideation? Passive (patient avoided answering the question; but shook her head "yes")  Self-Injurious Behavior Some self-injurious ideation observed or expressed.  No lethal plan expressed   Agreement Not to Harm Self Yes  Description of Agreement Verbal  Danger to Others  Danger to Others None reported or observed

## 2023-02-07 NOTE — Progress Notes (Signed)
[facility] MD Progress Note  02/08/2023 3:12 PM Shannon [name]  MRN:  [id number] Subjective:  57 year old African American female who reports, "I'm sleeping better," but expresses concern, stating, "Ain't no one talking about my cirrhosis liver." She denies any suicidal ideation (SI), homicidal ideation (HI), or auditory/visual hallucinations (AVH).The patient demonstrates improving sleep patterns and appears stable from a psychiatric perspective. Physical health concerns related to cirrhosis and bilateral pedal edema were acknowledged, and compression hose was ordered for management. The patient's steady gait with a walker indicates functional mobility, and no acute psychiatric or medical interventions are required at this time. Principal Problem: Schizoaffective disorder, bipolar type (HCC) Diagnosis: Principal Problem:   Schizoaffective disorder, bipolar type (HCC)  Total Time spent with patient: 1 hour  Past Psychiatric History: see below  Past Medical History:  Past Medical History:  Diagnosis Date   Allergic rhinitis    Allergic rhinitis 01/25/2009        ANEMIA-IRON DEFICIENCY 01/25/2009   Anxiety    Backache 01/25/2009   Qualifier: Diagnosis of  By: Jonny Ruiz MD, Len Blalock    Bipolar 1 disorder [facility])    Chronic pain syndrome    Complete rotator cuff tear 07/21/2013   Complete tear of right rotator cuff 07/21/2013   Contact lens/glasses fitting    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   DISC DISEASE, LUMBAR 01/25/2009   Essential hypertension 01/25/2009   On Bystolic    GAD (generalized anxiety disorder) 12/28/2013   GERD (gastroesophageal reflux disease)    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   Headache, acute 12/20/2013   Hx of laparoscopic gastric banding 09/03/2010   Surgery date: 07/17/10    HYPERLIPIDEMIA 01/25/2009   Hyperlipidemia with target LDL less than 130 01/25/2009   HYPERTENSION 01/25/2009   Hypokalemia, inadequate intake 09/01/2011   Insomnia 04/04/2011   INSOMNIA-SLEEP  DISORDER-UNSPEC 04/30/2009   Qualifier: Diagnosis of  By: Jonny Ruiz MD, Len Blalock    Iron deficiency anemia 01/25/2009        Labyrinthitis 02/19/2014   Leiomyoma of uterus, unspecified 08/13/2008   Overview:  Leiomyoma Of The Uterus  10/1 IMO update   Localized edema 06/12/2015   MANIC DEPRESSIVE ILLNESS 01/25/2009   pt is unsue if this is her specifc dx   Mixed bipolar I disorder (HCC) 07/12/2012   Morbid obesity (HCC) 01/25/2009   Night sweats    Osteopenia determined by x-ray 06/13/2021   Other abnormal glucose 10/26/2012   Palpitations 10/25/2013   PEPTIC ULCER DISEASE, HELICOBACTER PYLORI POSITIVE 10/03/2009   Peripheral edema 06/25/2015   Pernicious anemia 03/28/2012   Piriformis syndrome of both sides 09/02/2018   PUD (peptic ulcer disease) 10/03/2009        Right shoulder pain 05/03/2013   Dg Shoulder Right  05/03/2013   CLINICAL DATA Pain.  EXAM RIGHT SHOULDER - 2+ VIEW  COMPARISON None.  FINDINGS Acromioclavicular and glenohumeral degenerative change present. Questionable calcific density noted in the region of the supraspinatus space, possibly representing calcific supraspinatus tendinitis. This could represent a sclerotic density in the acromion. MRI of the right shoulder suggest   Sleep apnea 08/12/2016   SMOKER 01/25/2009   Qualifier: Diagnosis of  By: Jonny Ruiz MD, Len Blalock    Subacromial bursitis 05/17/2013   SUBSTANCE ABUSE 11/06/2009        Thiamin deficiency 10/30/2012   Tobacco abuse 06/09/2010   Visual disturbance 05/03/2013   Vitamin D deficiency 10/27/2012    Past Surgical History:  Procedure Laterality Date   ABDOMINAL HYSTERECTOMY  BLADDER SURGERY     s/p with ?diverticulitis   HAND TENDON SURGERY  1991   s/p-Right-index and middle   LAPAROSCOPIC GASTRIC BAND REMOVAL WITH LAPAROSCOPIC GASTRIC SLEEVE RESECTION N/A 08/17/2016   Procedure: LAPAROSCOPIC GASTRIC BAND REMOVAL WITH LAPAROSCOPIC GASTRIC SLEEVE RESECTION WITH UPPER ENDO;  Surgeon: Ovidio Kin, MD;  Location: [facility];  Service:  General;  Laterality: N/A;   LAPAROSCOPIC GASTRIC BANDING  07/14/10   SHOULDER ARTHROSCOPY Right 07/21/2013   Procedure: RIGHT ARTHROSCOPY SHOULDER DEBRIDMENT EXTENTSIVE,ARTHROSCOPIC REMOVE LOOSE FOREIGN BODY, BICEPS TENOLYSIS ;  Surgeon: Eulas Post, MD;  Location: [facility];  Service: Orthopedics;  Laterality: Right;   TUBAL LIGATION     Family History:  Family History  Problem Relation Age of Onset   Hypertension Mother    Stroke Father    Cirrhosis Father        ETOH   Hypertension Father    Diabetes Father    Alcohol abuse Father    Hypertension Sister    Asthma Sister    Hypertension Brother    Hypertension Paternal Aunt    Diabetes Maternal Grandmother    ADD / ADHD Son    ADD / ADHD Other        3 nephews, also emotional issues   Diabetes Other        Uncle   Diabetes Other        Aunt   Suicidality Neg Hx    Family Psychiatric  History: see above  Social History:  Social History   Substance and Sexual Activity  Alcohol Use No   Alcohol/week: 0.0 standard drinks of alcohol     Social History   Substance and Sexual Activity  Drug Use No    Social History   Socioeconomic History   Marital status: Divorced    Spouse name: Not on file   Number of children: 2   Years of education: Not on file   Highest education level: Professional school degree (e.g., MD, DDS, DVM, JD)  Occupational History   Occupation: STUDENT    Employer: UNEMPLOYED  Tobacco Use   Smoking status: Every Day    Current packs/day: 0.25    Average packs/day: 0.3 packs/day for 20.0 years (5.0 ttl pk-yrs)    Types: Cigarettes   Smokeless tobacco: Never   Tobacco comments:    States has cut back to 5-7 a day and working on this.   Vaping Use   Vaping status: Never Used  Substance and Sexual Activity   Alcohol use: No    Alcohol/week: 0.0 standard drinks of alcohol   Drug use: No   Sexual activity: Not Currently  Other Topics Concern   Not on file  Social History  Narrative   Moved from [place], then [place] to [place] 2009   2 children-1 boy, 1 girl   Disabled-bipolar   Daily Caffeine Use-1 cup/day      Right Handed   Lives in 2nd floor condo   Social Drivers of Health   Financial Resource Strain: High Risk (01/01/2023)   Overall Financial Resource Strain (CARDIA)    Difficulty of Paying Living Expenses: Very hard  Food Insecurity: No Food Insecurity (02/04/2023)   Hunger Vital Sign    Worried About Running Out of Food in the Last Year: Never true    Ran Out of Food in the Last Year: Never true  Recent Concern: Food Insecurity - Food Insecurity Present (01/28/2023)   Hunger Vital Sign  Worried About Programme researcher, broadcasting/film/video in the Last Year: Sometimes true    [employer] of Food in the Last Year: Sometimes true  Transportation Needs: No Transportation Needs (02/04/2023)   PRAPARE - Administrator, Civil Service (Medical): No    Lack of Transportation (Non-Medical): No  Physical Activity: Inactive (01/01/2023)   Exercise Vital Sign    Days of Exercise per Week: 0 days    Minutes of Exercise per Session: 0 min  Stress: Stress Concern Present (01/01/2023)   [employer] of Occupational Health - Occupational Stress Questionnaire    Feeling of Stress : Very much  Social Connections: Socially Isolated (01/01/2023)   Social Connection and Isolation Panel [NHANES]    Frequency of Communication with Friends and Family: Never    Frequency of Social Gatherings with Friends and Family: Never    Attends Religious Services: 1 to 4 times per year    Active Member of [employer] or Organizations: No    Attends Engineer, structural: Not on file    Marital Status: Separated   Additional Social History:                         Sleep: Good  Appetite:  Good  Current Medications: Current Facility-Administered Medications  Medication Dose Route Frequency Provider Last Rate Last Admin   acetaminophen (TYLENOL) tablet 650 mg  650  mg Oral Q6H PRN Starkes-Perry, Juel Burrow, FNP       alum & mag hydroxide-simeth (MAALOX/MYLANTA) 200-200-20 MG/5ML suspension 30 mL  30 mL Oral Q4H PRN Starkes-Perry, Juel Burrow, FNP       busPIRone (BUSPAR) tablet 10 mg  10 mg Oral BID Myriam Forehand, NP   10 mg at 02/08/23 0956   clonazepam (KLONOPIN) disintegrating tablet 0.125 mg  0.125 mg Oral BID Myriam Forehand, NP   0.125 mg at 02/08/23 4132   clonazePAM (KLONOPIN) disintegrating tablet 0.25 mg  0.25 mg Oral BID Myriam Forehand, NP   0.25 mg at 02/07/23 1734   clonazePAM (KLONOPIN) tablet 0.5 mg  0.5 mg Oral BID PRN Maryagnes Amos, FNP   0.5 mg at 02/06/23 2107   cyanocobalamin (VITAMIN B12) tablet 1,000 mcg  1,000 mcg Oral Daily Maryagnes Amos, FNP   1,000 mcg at 02/08/23 1020   haloperidol (HALDOL) tablet 5 mg  5 mg Oral TID PRN Maryagnes Amos, FNP   5 mg at 02/05/23 1252   And   diphenhydrAMINE (BENADRYL) capsule 50 mg  50 mg Oral TID PRN Maryagnes Amos, FNP   50 mg at 02/05/23 1252   feeding supplement (ENSURE ENLIVE / ENSURE PLUS) liquid 237 mL  237 mL Oral BID BM Maryagnes Amos, FNP   237 mL at 02/08/23 1420   gabapentin (NEURONTIN) capsule 600 mg  600 mg Oral TID Myriam Forehand, NP   600 mg at 02/08/23 1419   hydrochlorothiazide (HYDRODIURIL) tablet 6.25 mg  6.25 mg Oral Daily Maryagnes Amos, FNP   6.25 mg at 02/08/23 1003   hydrOXYzine (ATARAX) tablet 50 mg  50 mg Oral QHS Maryagnes Amos, FNP   50 mg at 02/06/23 2108   magnesium hydroxide (MILK OF MAGNESIA) suspension 30 mL  30 mL Oral Daily PRN Maryagnes Amos, FNP   30 mL at 02/04/23 1825   multivitamin with minerals tablet 1 tablet  1 tablet Oral BID WC Starkes-Perry, Juel Burrow, FNP   1  tablet at 02/08/23 0959   nicotine polacrilex (NICORETTE) gum 2 mg  2 mg Oral PRN Maryagnes Amos, FNP       Oxcarbazepine (TRILEPTAL) tablet 300 mg  300 mg Oral BID Maryagnes Amos, FNP   300 mg at 02/07/23 1734   pantoprazole  (PROTONIX) EC tablet 40 mg  40 mg Oral BID AC Maryagnes Amos, FNP   40 mg at 02/08/23 1002   polyethylene glycol (MIRALAX / GLYCOLAX) packet 17 g  17 g Oral Daily Maryagnes Amos, FNP   17 g at 02/08/23 1005   potassium chloride SA (KLOR-CON M) CR tablet 20 mEq  20 mEq Oral Daily Lolita Patella B, MD   20 mEq at 02/08/23 0959   pregabalin (LYRICA) capsule 75 mg  75 mg Oral BID Lolita Patella B, MD   75 mg at 02/07/23 1734   QUEtiapine (SEROQUEL) tablet 25 mg  25 mg Oral BID Maryagnes Amos, FNP   25 mg at 02/08/23 0956   QUEtiapine (SEROQUEL) tablet 400 mg  400 mg Oral QHS Myriam Forehand, NP   400 mg at 02/06/23 2108   simethicone (MYLICON) 40 MG/0.6ML suspension 40 mg  40 mg Oral QID PRN Maryagnes Amos, FNP       topiramate (TOPAMAX) tablet 50 mg  50 mg Oral BID Myriam Forehand, NP   50 mg at 02/07/23 1734   traMADol (ULTRAM) tablet 100 mg  100 mg Oral Q6H PRN Myriam Forehand, NP   100 mg at 02/08/23 0522    Lab Results: No results found. However, due to the size of the patient record, not all encounters were searched. Please check Results Review for a complete set of results.  Blood Alcohol level:  Lab Results  Component Value Date   [facility] <10 01/01/2023   ETH <11 07/11/2012    Metabolic Disorder Labs: Lab Results  Component Value Date   HGBA1C 5.5 01/29/2023   MPG 111.15 01/29/2023   No results found for: "PROLACTIN" Lab Results  Component Value Date   CHOL 244 (H) 05/14/2021   TRIG 164.0 (H) 05/14/2021   HDL 51.60 05/14/2021   CHOLHDL 5 05/14/2021   VLDL 32.8 05/14/2021   LDLCALC 160 (H) 05/14/2021   LDLCALC 180 (H) 11/22/2019    Physical Findings: AIMS:  , ,  ,  ,    CIWA:    COWS:     Musculoskeletal: Strength & Muscle Tone: within normal limits Gait & Station: normal Patient leans: N/A  Psychiatric Specialty Exam:  Presentation  General Appearance:  Fairly Groomed  Eye Contact: Minimal  Speech: Clear and Coherent  Speech  Volume: Increased  Handedness: Right   Mood and Affect  Mood: Anxious; Irritable  Affect: Flat   Thought Process  Thought Processes: Disorganized  Descriptions of Associations:Intact  Orientation:Full (Time, Place and Person)  Thought Content:WDL  History of Schizophrenia/Schizoaffective disorder:Yes  Duration of Psychotic Symptoms:Greater than six months  Hallucinations:Hallucinations: None Description of Auditory Hallucinations: denies  Ideas of Reference:None  Suicidal Thoughts:Suicidal Thoughts: No SI Passive Intent and/or Plan: -- (denies)  Homicidal Thoughts:Homicidal Thoughts: No   Sensorium  Memory: Immediate Good; Remote Good  Judgment: Poor  Insight: Poor   Executive Functions  Concentration: Fair  Attention Span: Fair  Recall: Good  Fund of Knowledge: Good  Language: Good   Psychomotor Activity  Psychomotor Activity: Psychomotor Activity: Normal (with walker)   Assets  Assets: Communication Skills; Financial Resources/Insurance   Sleep  Sleep:  Sleep: Good Number of Hours of Sleep: 7    Physical Exam: Physical Exam Vitals and nursing note reviewed.  Constitutional:      Appearance: Normal appearance.  HENT:     Head: Normocephalic and atraumatic.     Nose: Nose normal.  Pulmonary:     Effort: Pulmonary effort is normal.  Musculoskeletal:     Cervical back: Normal range of motion.     Right lower leg: Edema present.     Left lower leg: Edema present.  Neurological:     General: No focal deficit present.     Mental Status: She is alert and oriented to person, place, and time. Mental status is at baseline.  Psychiatric:        Attention and Perception: Attention and perception normal.        Mood and Affect: Affect is blunt, flat and angry.        Speech: Speech normal.        Behavior: Behavior normal. Behavior is cooperative.        Thought Content: Thought content normal.        Cognition and Memory:  Cognition and memory normal.        Judgment: Judgment is impulsive.    Review of Systems  Cardiovascular:  Positive for leg swelling.  Psychiatric/Behavioral:  The patient is nervous/anxious.   All other systems reviewed and are negative.  Blood pressure 128/61, pulse 99, temperature 98.7 F (37.1 C), temperature source Oral, resp. rate 20, height 5\' 4"  (1.626 m), weight 104.3 kg, SpO2 95%. Body mass index is 39.48 kg/m.   Treatment Plan Summary: Daily contact with patient to assess and evaluate symptoms and progress in treatment and Medication management ncrease Seroquel (quetiapine) to 400 mg at bedtime to target psychotic and depressive symptoms. Increase Gabapentin to 600 mg three times daily to address neuropathic pain and discomfort. Notify the hospitalist to evaluate bilateral LE edema and neuropathy, and assess for potential underlying causes (e.g., heart, kidney, or medication-related). Myriam Forehand, NP 02/08/2023, 3:12 PM

## 2023-02-07 NOTE — Progress Notes (Signed)
Pt irritable with staff this evening. Pt denies SI/HI/AVH, refused her night medication. Pt given education, still refused. Pt isolative to her room tonight. Pt being monitored Q 15 minutes for safety per unit protocol, remains safe on the unit

## 2023-02-07 NOTE — BHH Counselor (Signed)
The patient requested to speak with a SW because she has has concerns about living situation. The patient stated that she wanted resources for low income or affordable housing. The patient stated that the chaplin gave her a number for a women's program but wrote the number down wrong.   The LCSWA informed the patient that she would try and contact the chaplin.  The patient also asked for some pants, shirt, and shoes. The LCSWA provided the patient with the items from clothing closet.     Patric Dykes, [name], MSW

## 2023-02-07 NOTE — Plan of Care (Signed)
  Problem: Education: Goal: Knowledge of [facility] Education information/materials will improve Outcome: Not Progressing Goal: Emotional status will improve Outcome: Not Progressing Goal: Mental status will improve Outcome: Not Progressing Goal: Verbalization of understanding the information provided will improve Outcome: Not Progressing   Problem: Activity: Goal: Interest or engagement in activities will improve Outcome: Not Progressing Goal: Sleeping patterns will improve Outcome: Not Progressing   Problem: Coping: Goal: Ability to verbalize frustrations and anger appropriately will improve Outcome: Not Progressing Goal: Ability to demonstrate self-control will improve Outcome: Not Progressing   Problem: Health Behavior/Discharge Planning: Goal: Identification of resources available to assist in meeting health care needs will improve Outcome: Not Progressing Goal: Compliance with treatment plan for underlying cause of condition will improve Outcome: Not Progressing   Problem: Physical Regulation: Goal: Ability to maintain clinical measurements within normal limits will improve Outcome: Not Progressing   Problem: Safety: Goal: Periods of time without injury will increase Outcome: Not Progressing   Problem: Education: Goal: Ability to make informed decisions regarding treatment will improve Outcome: Not Progressing   Problem: Coping: Goal: Coping ability will improve Outcome: Not Progressing   Problem: Self-Concept: Goal: Ability to disclose and discuss suicidal ideas will improve Outcome: Not Progressing   Problem: Coping: Goal: Coping ability will improve Outcome: Not Progressing Goal: Will verbalize feelings Outcome: Not Progressing   Problem: Education: Goal: Will be free of psychotic symptoms Outcome: Not Progressing Goal: Knowledge of the prescribed therapeutic regimen will improve Outcome: Not Progressing

## 2023-02-07 NOTE — Group Note (Signed)
Date:  02/07/2023 Time:  8:43 PM  Group Topic/Focus:  Wrap-Up Group:   The focus of this group is to help patients review their daily goal of treatment and discuss progress on daily workbooks.    Participation Level:  Active  Participation Quality:  Appropriate, Attentive, and Supportive  Affect:  Appropriate  Cognitive:  Alert and Appropriate  Insight: Appropriate  Engagement in Group:  Engaged  Modes of Intervention:  Discussion and Socialization  Additional Comments:   Pt very supportive with other patients in the dayroom. Pt wanting to get more attention about her medical plans in regards to her fluid retention in ankles and having them wrapped.   Maglione,Mckenzey Parcell E 02/07/2023, 8:43 PM

## 2023-02-08 NOTE — Group Note (Signed)
Date:  02/08/2023 Time:  9:40 PM  Group Topic/Focus:  Wrap-Up Group:   The focus of this group is to help patients review their daily goal of treatment and discuss progress on daily workbooks.    Participation Level:  Active  Participation Quality:  Appropriate, Attentive, Sharing, and Supportive  Affect:  Appropriate and Excited  Cognitive:  Alert and Appropriate  Insight: Appropriate  Engagement in Group:  Engaged  Modes of Intervention:  Discussion  Additional Comments:     Maglione,Rosebud Koenen E 02/08/2023, 9:40 PM

## 2023-02-08 NOTE — [place] FL2 (Cosign Needed Addendum)
[place] MEDICAID FL2 LEVEL OF CARE FORM     IDENTIFICATION  Patient Name: Shannon [name] Birthdate: 1966-01-19 Sex: female Admission Date (Current Location): 02/04/2023  [facility] [place] Number:      Facility and Address:  [facility], [address], [place], [place] [address]      Provider Number: [id number]  Attending Physician Name and Address:  Lewanda Rife, MD  Relative Name and Phone Number:  Lajuan Lines, brother, [phone]    Current Level of Care: Hospital Recommended Level of Care: Assisted Living Facility Prior Approval Number:    Date Approved/Denied:   PASRR Number:    Discharge Plan: Other (Comment)    Current Diagnoses: Patient Active Problem List   Diagnosis Date Noted   Schizoaffective disorder, bipolar type (HCC) 02/04/2023   Housing insecurity 01/28/2023   Peripheral edema 01/28/2023   Hypoalbuminemia 01/28/2023   Anemia 01/28/2023   Bipolar disorder, current episode depressed, severe, with psychotic features (HCC) 01/02/2023   Lack of housing 01/01/2023   At high risk for falls 01/01/2023   Other chronic pain 01/01/2023   Suicidal ideations 01/01/2023   C. difficile colitis 08/30/2022   Hemorrhoid 08/29/2022   Bilateral leg weakness 08/28/2022   Hypocalcemia 08/28/2022   Fall at home, initial encounter 08/28/2022   Diarrhea 08/28/2022   Vitamin B12 deficiency 01/14/2022   Poor diet 01/14/2022   Folate deficiency 01/14/2022   Assistance needed with transportation 01/14/2022   Prediabetes 01/14/2022   Chronic abdominal pain 12/31/2021   Hiatal hernia 12/31/2021   Blurred vision, bilateral 12/25/2021   Pins and needles sensation 12/25/2021   Dizziness 12/25/2021   Upper abdominal pain 12/25/2021   Unable to ambulate 12/25/2021   Osteopenia determined by x-ray 06/13/2021   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   Left hip pain 06/13/2021   Numbness and tingling of both lower  extremities 06/10/2021   Laryngopharyngeal reflux 08/16/2020   Dysphagia 08/13/2020   Thrush 08/13/2020   Hoarseness 08/13/2020   Tachycardia 03/09/2020   Rash 03/09/2020   Neck mass 03/09/2020   Pain in toes of both feet 03/09/2020   Piriformis syndrome of both sides 09/02/2018   Routine general medical examination at a health care facility 12/22/2016   Sleep apnea 08/12/2016   Bilateral lower extremity edema 06/12/2015   Colon cancer screening 01/24/2015   GERD (gastroesophageal reflux disease) 12/26/2014   GAD (generalized anxiety disorder) 12/28/2013   Severe headache 12/20/2013   Abnormal vaginal Pap smear 10/25/2013   Thiamin deficiency 10/30/2012   Vitamin D deficiency 10/27/2012   Other abnormal glucose 10/26/2012   Mixed bipolar I disorder (HCC) 07/12/2012   Hypokalemia 09/01/2011   Insomnia 04/04/2011   Visit for screening mammogram 02/26/2011   Hx of laparoscopic gastric banding 09/03/2010   Tobacco abuse 06/09/2010   Hyperlipidemia with target LDL less than 130 01/25/2009   Morbid obesity (HCC) 01/25/2009   Iron deficiency anemia 01/25/2009   Essential hypertension 01/25/2009   Allergic rhinitis 01/25/2009   Degenerative disc disease, lumbar 01/25/2009   Backache 01/25/2009   Leiomyoma of uterus, unspecified 08/13/2008   Asthma 08/13/2008    Orientation RESPIRATION BLADDER Height & Weight     Self, Time, Situation, Place  Normal Continent Weight: 104.3 kg Height:  5\' 4"  (162.6 cm)  BEHAVIORAL SYMPTOMS/MOOD NEUROLOGICAL BOWEL NUTRITION STATUS      Continent Diet  AMBULATORY STATUS COMMUNICATION OF NEEDS Skin   Supervision Verbally Normal  Personal Care Assistance Level of Assistance  Bathing, Dressing Bathing Assistance: Limited assistance Feeding assistance: Independent Dressing Assistance: Limited assistance     Functional Limitations Info  Sight Sight Info: Impaired Hearing Info: Adequate Speech Info: Adequate     SPECIAL CARE FACTORS FREQUENCY  Blood pressure Blood Pressure Frequency: 1x daily                  Contractures Contractures Info: Not present    Additional Factors Info  Code Status, Allergies, Psychotropic Code Status Info: Full Allergies Info: Pork products, lamictal Psychotropic Info: Seroquel         Current Medications (02/08/2023):  This is the current hospital active medication list Current Facility-Administered Medications  Medication Dose Route Frequency Provider Last Rate Last Admin   acetaminophen (TYLENOL) tablet 650 mg  650 mg Oral Q6H PRN Starkes-Perry, Juel Burrow, FNP       alum & mag hydroxide-simeth (MAALOX/MYLANTA) 200-200-20 MG/5ML suspension 30 mL  30 mL Oral Q4H PRN Starkes-Perry, Juel Burrow, FNP       busPIRone (BUSPAR) tablet 10 mg  10 mg Oral BID Myriam Forehand, NP   10 mg at 02/08/23 2956   clonazepam (KLONOPIN) disintegrating tablet 0.125 mg  0.125 mg Oral BID Myriam Forehand, NP   0.125 mg at 02/08/23 2130   cyanocobalamin (VITAMIN B12) tablet 1,000 mcg  1,000 mcg Oral Daily Maryagnes Amos, FNP   1,000 mcg at 02/08/23 1020   haloperidol (HALDOL) tablet 5 mg  5 mg Oral TID PRN Maryagnes Amos, FNP   5 mg at 02/05/23 1252   And   diphenhydrAMINE (BENADRYL) capsule 50 mg  50 mg Oral TID PRN Maryagnes Amos, FNP   50 mg at 02/05/23 1252   feeding supplement (ENSURE ENLIVE / ENSURE PLUS) liquid 237 mL  237 mL Oral BID BM Maryagnes Amos, FNP   237 mL at 02/08/23 1420   gabapentin (NEURONTIN) capsule 600 mg  600 mg Oral TID Myriam Forehand, NP   600 mg at 02/08/23 1419   hydrochlorothiazide (HYDRODIURIL) tablet 6.25 mg  6.25 mg Oral Daily Maryagnes Amos, FNP   6.25 mg at 02/08/23 1003   hydrOXYzine (ATARAX) tablet 50 mg  50 mg Oral QHS Maryagnes Amos, FNP   50 mg at 02/06/23 2108   magnesium hydroxide (MILK OF MAGNESIA) suspension 30 mL  30 mL Oral Daily PRN Maryagnes Amos, FNP   30 mL at 02/04/23 1825    multivitamin with minerals tablet 1 tablet  1 tablet Oral BID WC Maryagnes Amos, FNP   1 tablet at 02/08/23 8657   nicotine polacrilex (NICORETTE) gum 2 mg  2 mg Oral PRN Maryagnes Amos, FNP       Oxcarbazepine (TRILEPTAL) tablet 300 mg  300 mg Oral BID Maryagnes Amos, FNP   300 mg at 02/07/23 1734   pantoprazole (PROTONIX) EC tablet 40 mg  40 mg Oral BID AC Maryagnes Amos, FNP   40 mg at 02/08/23 1002   polyethylene glycol (MIRALAX / GLYCOLAX) packet 17 g  17 g Oral Daily Maryagnes Amos, FNP   17 g at 02/08/23 1005   potassium chloride SA (KLOR-CON M) CR tablet 20 mEq  20 mEq Oral Daily Lolita Patella B, MD   20 mEq at 02/08/23 0959   pregabalin (LYRICA) capsule 75 mg  75 mg Oral BID Lolita Patella B, MD   75 mg at 02/07/23 1734   QUEtiapine (  SEROQUEL) tablet 25 mg  25 mg Oral BID Maryagnes Amos, FNP   25 mg at 02/08/23 0981   QUEtiapine (SEROQUEL) tablet 400 mg  400 mg Oral QHS Myriam Forehand, NP   400 mg at 02/06/23 2108   simethicone (MYLICON) 40 MG/0.6ML suspension 40 mg  40 mg Oral QID PRN Maryagnes Amos, FNP       topiramate (TOPAMAX) tablet 50 mg  50 mg Oral BID Myriam Forehand, NP   50 mg at 02/07/23 1734   traMADol (ULTRAM) tablet 100 mg  100 mg Oral Q6H PRN Myriam Forehand, NP   100 mg at 02/08/23 0522     Discharge Medications: Please see discharge summary for a list of discharge medications.  Relevant Imaging Results:  Relevant Lab Results:   Additional Information SS#[id number] Augusto Garbe, LCSW

## 2023-02-08 NOTE — Plan of Care (Signed)
Patient verbalized this morning that her medical issues not taken care. Patient visible in the milieu. Interacting with peers.Denies SI,HI and AVH. Appetite and energy level good.Patient wants to put the Ted horse in the morning. Support and encouragement given.

## 2023-02-08 NOTE — Group Note (Signed)
Recreation Therapy Group Note   Group Topic:General Recreation  Group Date: 02/08/2023 Start Time: 1530 End Time: 1645 Facilitators: Rosina Lowenstein, LRT, CTRS Location:  Dayroom  Group Description: Recreation. Patients were given the opportunity to play cards, journal, or listen to music during group. Pt identified and conversated about things they enjoy doing in their free time and how they can continue to do that outside of the hospital.  Goal Area(s) Addressed: Patient will practice making a positive decision. Patient will have the opportunity to try a new leisure activity. Patient will communicate with peers and LRT.   Affect/Mood: N/A   Participation Level: Did not attend    Clinical Observations/Individualized Feedback: Patient did not attend group.   Plan: Continue to engage patient in RT group sessions 2-3x/week.   Rosina Lowenstein, LRT, CTRS 02/08/2023 5:20 PM

## 2023-02-08 NOTE — Group Note (Signed)
[facility] LCSW Group Therapy Note    Group Date: 02/08/2023 Start Time: 1315 End Time: 1415  Type of Therapy and Topic:  Group Therapy:  Overcoming Obstacles  Participation Level:  [facility] PARTICIPATION LEVEL: Minimal  Mood:  Description of Group:   In this group patients will be encouraged to explore what they see as obstacles to their own wellness and recovery. They will be guided to discuss their thoughts, feelings, and behaviors related to these obstacles. The group will process together ways to cope with barriers, with attention given to specific choices patients can make. Each patient will be challenged to identify changes they are motivated to make in order to overcome their obstacles. This group will be process-oriented, with patients participating in exploration of their own experiences as well as giving and receiving support and challenge from other group members.  Therapeutic Goals: 1. Patient will identify personal and current obstacles as they relate to admission. 2. Patient will identify barriers that currently interfere with their wellness or overcoming obstacles.  3. Patient will identify feelings, thought process and behaviors related to these barriers. 4. Patient will identify two changes they are willing to make to overcome these obstacles:    Summary of Patient Progress Patient was present in group.  However, patient would fall asleep intermittently.    Therapeutic Modalities:   Cognitive Behavioral Therapy Solution Focused Therapy Motivational Interviewing Relapse Prevention Therapy   Harden Mo, LCSW

## 2023-02-08 NOTE — Group Note (Signed)
Recreation Therapy Group Note   Group Topic:Coping Skills  Group Date: 02/08/2023 Start Time: 1000 End Time: 1100 Facilitators: Rosina Lowenstein, LRT, CTRS Location:  Craft Room  Group Description: Mind Map.  Patient was provided a blank template of a diagram with 32 blank boxes in a tiered system, branching from the center (similar to a bubble chart). LRT directed patients to label the middle of the diagram "Coping Skills". LRT and patients then came up with 8 different coping skills as examples. Pt were directed to record their coping skills in the 2nd tier boxes closest to the center.  Patients would then share their coping skills with the group as LRT wrote them out. LRT gave a handout of 99 different coping skills at the end of group.   Goal Area(s) Addressed: Patients will be able to define "coping skills". Patient will identify new coping skills.  Patient will increase communication.   Affect/Mood: Blunted   Participation Level: Non-verbal    Clinical Observations/Individualized Feedback: Patient came into the craft room late. Pt looked at LRT and goes "Who are you?" Seeming irritable. LRT introduced herself to pt. LRT asked pt what her name was and pt copied what LRT said. LRT began to talk with patient 1:1 and give instructions since pt came late and did not hear them the first time. Pt shared "I don't have my glasses. I will not be participating" very blunted and rigid like. After group was over, pt stayed and shared that she enjoyed group and was sorry for her attitude.  Plan: Continue to engage patient in RT group sessions 2-3x/week.   Rosina Lowenstein, LRT, CTRS 02/08/2023 1:02 PM

## 2023-02-08 NOTE — Progress Notes (Signed)
[facility] MD Progress Note  02/08/2023 3:20 PM Shannon [name]  MRN:  [id number] Subjective:  57 year old African American female with a principal diagnosis of schizoaffective disorder, bipolar type (HCC). She denies suicidal ideation (SI), homicidal ideation (HI), and auditory/visual hallucinations (AVH). She reports concerns regarding her liver cirrhosis and lymphedema, stating she is "sleeping better" but feels her physical health issues are not being fully addressed.The patient demonstrates stability in managing her schizoaffective disorder symptoms with no current psychosis or mood instability noted. Concerns regarding her physical health, particularly cirrhosis and lymphedema, were addressed by the hospitalist team. Compression stockings have been ordered to manage lymphedema, and the cirrhosis is being monitored without the need for active treatment at this time. Principal Problem: Schizoaffective disorder, bipolar type (HCC) Diagnosis: Principal Problem:   Schizoaffective disorder, bipolar type (HCC)  Total Time spent with patient: 2 hours  Past Psychiatric History: see below  Past Medical History:  Past Medical History:  Diagnosis Date   Allergic rhinitis    Allergic rhinitis 01/25/2009        ANEMIA-IRON DEFICIENCY 01/25/2009   Anxiety    Backache 01/25/2009   Qualifier: Diagnosis of  By: Jonny Ruiz MD, Len Blalock    Bipolar 1 disorder [facility])    Chronic pain syndrome    Complete rotator cuff tear 07/21/2013   Complete tear of right rotator cuff 07/21/2013   Contact lens/glasses fitting    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   DISC DISEASE, LUMBAR 01/25/2009   Essential hypertension 01/25/2009   On Bystolic    GAD (generalized anxiety disorder) 12/28/2013   GERD (gastroesophageal reflux disease)    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   Headache, acute 12/20/2013   Hx of laparoscopic gastric banding 09/03/2010   Surgery date: 07/17/10    HYPERLIPIDEMIA 01/25/2009   Hyperlipidemia with  target LDL less than 130 01/25/2009   HYPERTENSION 01/25/2009   Hypokalemia, inadequate intake 09/01/2011   Insomnia 04/04/2011   INSOMNIA-SLEEP DISORDER-UNSPEC 04/30/2009   Qualifier: Diagnosis of  By: Jonny Ruiz MD, Len Blalock    Iron deficiency anemia 01/25/2009        Labyrinthitis 02/19/2014   Leiomyoma of uterus, unspecified 08/13/2008   Overview:  Leiomyoma Of The Uterus  10/1 IMO update   Localized edema 06/12/2015   MANIC DEPRESSIVE ILLNESS 01/25/2009   pt is unsue if this is her specifc dx   Mixed bipolar I disorder (HCC) 07/12/2012   Morbid obesity (HCC) 01/25/2009   Night sweats    Osteopenia determined by x-ray 06/13/2021   Other abnormal glucose 10/26/2012   Palpitations 10/25/2013   PEPTIC ULCER DISEASE, HELICOBACTER PYLORI POSITIVE 10/03/2009   Peripheral edema 06/25/2015   Pernicious anemia 03/28/2012   Piriformis syndrome of both sides 09/02/2018   PUD (peptic ulcer disease) 10/03/2009        Right shoulder pain 05/03/2013   Dg Shoulder Right  05/03/2013   CLINICAL DATA Pain.  EXAM RIGHT SHOULDER - 2+ VIEW  COMPARISON None.  FINDINGS Acromioclavicular and glenohumeral degenerative change present. Questionable calcific density noted in the region of the supraspinatus space, possibly representing calcific supraspinatus tendinitis. This could represent a sclerotic density in the acromion. MRI of the right shoulder suggest   Sleep apnea 08/12/2016   SMOKER 01/25/2009   Qualifier: Diagnosis of  By: Jonny Ruiz MD, Len Blalock    Subacromial bursitis 05/17/2013   SUBSTANCE ABUSE 11/06/2009        Thiamin deficiency 10/30/2012   Tobacco abuse 06/09/2010   Visual disturbance 05/03/2013  Vitamin D deficiency 10/27/2012    Past Surgical History:  Procedure Laterality Date   ABDOMINAL HYSTERECTOMY     BLADDER SURGERY     s/p with ?diverticulitis   HAND TENDON SURGERY  1991   s/p-Right-index and middle   LAPAROSCOPIC GASTRIC BAND REMOVAL WITH LAPAROSCOPIC GASTRIC SLEEVE RESECTION N/A 08/17/2016   Procedure: LAPAROSCOPIC  GASTRIC BAND REMOVAL WITH LAPAROSCOPIC GASTRIC SLEEVE RESECTION WITH UPPER ENDO;  Surgeon: Ovidio Kin, MD;  Location: [facility];  Service: General;  Laterality: N/A;   LAPAROSCOPIC GASTRIC BANDING  07/14/10   SHOULDER ARTHROSCOPY Right 07/21/2013   Procedure: RIGHT ARTHROSCOPY SHOULDER DEBRIDMENT EXTENTSIVE,ARTHROSCOPIC REMOVE LOOSE FOREIGN BODY, BICEPS TENOLYSIS ;  Surgeon: Eulas Post, MD;  Location: [facility];  Service: Orthopedics;  Laterality: Right;   TUBAL LIGATION     Family History:  Family History  Problem Relation Age of Onset   Hypertension Mother    Stroke Father    Cirrhosis Father        ETOH   Hypertension Father    Diabetes Father    Alcohol abuse Father    Hypertension Sister    Asthma Sister    Hypertension Brother    Hypertension Paternal Aunt    Diabetes Maternal Grandmother    ADD / ADHD Son    ADD / ADHD Other        3 nephews, also emotional issues   Diabetes Other        Uncle   Diabetes Other        Aunt   Suicidality Neg Hx    Family Psychiatric  History: see above Social History:  Social History   Substance and Sexual Activity  Alcohol Use No   Alcohol/week: 0.0 standard drinks of alcohol     Social History   Substance and Sexual Activity  Drug Use No    Social History   Socioeconomic History   Marital status: Divorced    Spouse name: Not on file   Number of children: 2   Years of education: Not on file   Highest education level: Professional school degree (e.g., MD, DDS, DVM, JD)  Occupational History   Occupation: STUDENT    Employer: UNEMPLOYED  Tobacco Use   Smoking status: Every Day    Current packs/day: 0.25    Average packs/day: 0.3 packs/day for 20.0 years (5.0 ttl pk-yrs)    Types: Cigarettes   Smokeless tobacco: Never   Tobacco comments:    States has cut back to 5-7 a day and working on this.   Vaping Use   Vaping status: Never Used  Substance and Sexual Activity   Alcohol use: No     Alcohol/week: 0.0 standard drinks of alcohol   Drug use: No   Sexual activity: Not Currently  Other Topics Concern   Not on file  Social History Narrative   Moved from [place], then [place] to [place] 2009   2 children-1 boy, 1 girl   Disabled-bipolar   Daily Caffeine Use-1 cup/day      Right Handed   Lives in 2nd floor condo   Social Drivers of Health   Financial Resource Strain: High Risk (01/01/2023)   Overall Financial Resource Strain (CARDIA)    Difficulty of Paying Living Expenses: Very hard  Food Insecurity: No Food Insecurity (02/04/2023)   Hunger Vital Sign    Worried About Running Out of Food in the Last Year: Never true    Ran Out of Food in the Last  Year: Never true  Recent Concern: Food Insecurity - Food Insecurity Present (01/28/2023)   Hunger Vital Sign    Worried About Running Out of Food in the Last Year: Sometimes true    Ran Out of Food in the Last Year: Sometimes true  Transportation Needs: No Transportation Needs (02/04/2023)   PRAPARE - Administrator, Civil Service (Medical): No    Lack of Transportation (Non-Medical): No  Physical Activity: Inactive (01/01/2023)   Exercise Vital Sign    Days of Exercise per Week: 0 days    Minutes of Exercise per Session: 0 min  Stress: Stress Concern Present (01/01/2023)   [employer] of Occupational Health - Occupational Stress Questionnaire    Feeling of Stress : Very much  Social Connections: Socially Isolated (01/01/2023)   Social Connection and Isolation Panel [NHANES]    Frequency of Communication with Friends and Family: Never    Frequency of Social Gatherings with Friends and Family: Never    Attends Religious Services: 1 to 4 times per year    Active Member of [employer] or Organizations: No    Attends Engineer, structural: Not on file    Marital Status: Separated   Additional Social History:                         Sleep: Good  Appetite:  Good  Current  Medications: Current Facility-Administered Medications  Medication Dose Route Frequency Provider Last Rate Last Admin   acetaminophen (TYLENOL) tablet 650 mg  650 mg Oral Q6H PRN Starkes-Perry, Juel Burrow, FNP       alum & mag hydroxide-simeth (MAALOX/MYLANTA) 200-200-20 MG/5ML suspension 30 mL  30 mL Oral Q4H PRN Starkes-Perry, Juel Burrow, FNP       busPIRone (BUSPAR) tablet 10 mg  10 mg Oral BID Myriam Forehand, NP   10 mg at 02/08/23 0956   clonazepam (KLONOPIN) disintegrating tablet 0.125 mg  0.125 mg Oral BID Myriam Forehand, NP   0.125 mg at 02/08/23 1478   clonazePAM (KLONOPIN) disintegrating tablet 0.25 mg  0.25 mg Oral BID Myriam Forehand, NP   0.25 mg at 02/07/23 1734   clonazePAM (KLONOPIN) tablet 0.5 mg  0.5 mg Oral BID PRN Maryagnes Amos, FNP   0.5 mg at 02/06/23 2107   cyanocobalamin (VITAMIN B12) tablet 1,000 mcg  1,000 mcg Oral Daily Maryagnes Amos, FNP   1,000 mcg at 02/08/23 1020   haloperidol (HALDOL) tablet 5 mg  5 mg Oral TID PRN Maryagnes Amos, FNP   5 mg at 02/05/23 1252   And   diphenhydrAMINE (BENADRYL) capsule 50 mg  50 mg Oral TID PRN Maryagnes Amos, FNP   50 mg at 02/05/23 1252   feeding supplement (ENSURE ENLIVE / ENSURE PLUS) liquid 237 mL  237 mL Oral BID BM Maryagnes Amos, FNP   237 mL at 02/08/23 1420   gabapentin (NEURONTIN) capsule 600 mg  600 mg Oral TID Myriam Forehand, NP   600 mg at 02/08/23 1419   hydrochlorothiazide (HYDRODIURIL) tablet 6.25 mg  6.25 mg Oral Daily Maryagnes Amos, FNP   6.25 mg at 02/08/23 1003   hydrOXYzine (ATARAX) tablet 50 mg  50 mg Oral QHS Maryagnes Amos, FNP   50 mg at 02/06/23 2108   magnesium hydroxide (MILK OF MAGNESIA) suspension 30 mL  30 mL Oral Daily PRN Maryagnes Amos, FNP   30 mL at 02/04/23 1825  multivitamin with minerals tablet 1 tablet  1 tablet Oral BID WC Maryagnes Amos, FNP   1 tablet at 02/08/23 0272   nicotine polacrilex (NICORETTE) gum 2 mg  2 mg Oral PRN  Maryagnes Amos, FNP       Oxcarbazepine (TRILEPTAL) tablet 300 mg  300 mg Oral BID Maryagnes Amos, FNP   300 mg at 02/07/23 1734   pantoprazole (PROTONIX) EC tablet 40 mg  40 mg Oral BID AC Maryagnes Amos, FNP   40 mg at 02/08/23 1002   polyethylene glycol (MIRALAX / GLYCOLAX) packet 17 g  17 g Oral Daily Maryagnes Amos, FNP   17 g at 02/08/23 1005   potassium chloride SA (KLOR-CON M) CR tablet 20 mEq  20 mEq Oral Daily Lolita Patella B, MD   20 mEq at 02/08/23 0959   pregabalin (LYRICA) capsule 75 mg  75 mg Oral BID Lolita Patella B, MD   75 mg at 02/07/23 1734   QUEtiapine (SEROQUEL) tablet 25 mg  25 mg Oral BID Maryagnes Amos, FNP   25 mg at 02/08/23 0956   QUEtiapine (SEROQUEL) tablet 400 mg  400 mg Oral QHS Myriam Forehand, NP   400 mg at 02/06/23 2108   simethicone (MYLICON) 40 MG/0.6ML suspension 40 mg  40 mg Oral QID PRN Maryagnes Amos, FNP       topiramate (TOPAMAX) tablet 50 mg  50 mg Oral BID Myriam Forehand, NP   50 mg at 02/07/23 1734   traMADol (ULTRAM) tablet 100 mg  100 mg Oral Q6H PRN Myriam Forehand, NP   100 mg at 02/08/23 0522    Lab Results: No results found. However, due to the size of the patient record, not all encounters were searched. Please check Results Review for a complete set of results.  Blood Alcohol level:  Lab Results  Component Value Date   [facility] <10 01/01/2023   ETH <11 07/11/2012    Metabolic Disorder Labs: Lab Results  Component Value Date   HGBA1C 5.5 01/29/2023   MPG 111.15 01/29/2023   No results found for: "PROLACTIN" Lab Results  Component Value Date   CHOL 244 (H) 05/14/2021   TRIG 164.0 (H) 05/14/2021   HDL 51.60 05/14/2021   CHOLHDL 5 05/14/2021   VLDL 32.8 05/14/2021   LDLCALC 160 (H) 05/14/2021   LDLCALC 180 (H) 11/22/2019    Physical Findings: AIMS:  , ,  ,  ,    CIWA:    COWS:     Musculoskeletal: Strength & Muscle Tone: within normal limits Gait & Station: normal Patient  leans: N/A  Psychiatric Specialty Exam:  Presentation  General Appearance:  Appropriate for Environment  Eye Contact: Minimal  Speech: Clear and Coherent  Speech Volume: Increased  Handedness: Right   Mood and Affect  Mood: Anxious; Irritable  Affect: Flat   Thought Process  Thought Processes: Disorganized  Descriptions of Associations:Intact  Orientation:Full (Time, Place and Person)  Thought Content:WDL  History of Schizophrenia/Schizoaffective disorder:Yes  Duration of Psychotic Symptoms:Greater than six months  Hallucinations:Hallucinations: None Description of Auditory Hallucinations: denies  Ideas of Reference:None  Suicidal Thoughts:Suicidal Thoughts: No SI Passive Intent and/or Plan: -- (denies)  Homicidal Thoughts:Homicidal Thoughts: No   Sensorium  Memory: Immediate Good; Remote Good  Judgment: Poor  Insight: Poor   Executive Functions  Concentration: Fair  Attention Span: Fair  Recall: Good  Fund of Knowledge: Good  Language: Good   Psychomotor Activity  Psychomotor  Activity: Psychomotor Activity: Normal (with walker)   Assets  Assets: Communication Skills; Financial Resources/Insurance   Sleep  Sleep: Sleep: Good Number of Hours of Sleep: 7    Physical Exam: Physical Exam Vitals and nursing note reviewed.  Constitutional:      Appearance: Normal appearance.  HENT:     Head: Normocephalic and atraumatic.     Nose: Nose normal.  Pulmonary:     Effort: Pulmonary effort is normal.     Breath sounds: Normal breath sounds.  Musculoskeletal:        General: Normal range of motion.     Cervical back: Normal range of motion.     Comments: Ambulates with walker  Neurological:     General: No focal deficit present.     Mental Status: She is alert and oriented to person, place, and time. Mental status is at baseline.  Psychiatric:        Attention and Perception: Attention and perception normal.         Mood and Affect: Affect is angry.        Speech: Speech normal.        Behavior: Behavior is cooperative.        Thought Content: Thought content is paranoid.        Cognition and Memory: Cognition and memory normal.        Judgment: Judgment is impulsive.    Review of Systems  Psychiatric/Behavioral:  Positive for depression and suicidal ideas.   All other systems reviewed and are negative.  Blood pressure 128/61, pulse 99, temperature 98.7 F (37.1 C), temperature source Oral, resp. rate 20, height 5\' 4"  (1.626 m), weight 104.3 kg, SpO2 95%. Body mass index is 39.48 kg/m.   Treatment Plan Summary: Daily contact with patient to assess and evaluate symptoms and progress in treatment and Medication management. Daily contact with patient to assess and evaluate symptoms and progress in treatment and Medication management Increase Seroquel (quetiapine) to 400 mg at bedtime to target psychotic and depressive symptoms. Increase Gabapentin to 600 mg three times daily to address neuropathic pain and discomfort. Notify the hospitalist to evaluate bilateral LE edema and neuropathy, and assess for potential underlying causes (e.g., heart, kidney, or medication-related).  Myriam Forehand, NP 02/08/2023, 3:20 PM

## 2023-02-08 NOTE — BHH Counselor (Signed)
CSW sent referral to [facility] facility on patient's behalf.   Reymundo Poll, MSW, LCSWA 02/08/2023 4:40 PM

## 2023-02-08 NOTE — Plan of Care (Signed)
Pt refused medication tonight  Problem: Education: Goal: Knowledge of [facility] Education information/materials will improve Outcome: Not Progressing Goal: Emotional status will improve Outcome: Not Progressing Goal: Mental status will improve Outcome: Not Progressing Goal: Verbalization of understanding the information provided will improve Outcome: Not Progressing   Problem: Activity: Goal: Interest or engagement in activities will improve Outcome: Not Progressing Goal: Sleeping patterns will improve Outcome: Not Progressing   Problem: Coping: Goal: Ability to verbalize frustrations and anger appropriately will improve Outcome: Not Progressing Goal: Ability to demonstrate self-control will improve Outcome: Not Progressing   Problem: Health Behavior/Discharge Planning: Goal: Identification of resources available to assist in meeting health care needs will improve Outcome: Not Progressing Goal: Compliance with treatment plan for underlying cause of condition will improve Outcome: Not Progressing   Problem: Physical Regulation: Goal: Ability to maintain clinical measurements within normal limits will improve Outcome: Not Progressing   Problem: Safety: Goal: Periods of time without injury will increase Outcome: Not Progressing   Problem: Education: Goal: Ability to make informed decisions regarding treatment will improve Outcome: Not Progressing   Problem: Coping: Goal: Coping ability will improve Outcome: Not Progressing   Problem: Self-Concept: Goal: Ability to disclose and discuss suicidal ideas will improve Outcome: Not Progressing Goal: Will verbalize positive feelings about self Outcome: Not Progressing Note: Patient is on track. Patient will maintain adherence    Problem: Coping: Goal: Coping ability will improve Outcome: Not Progressing Goal: Will verbalize feelings Outcome: Not Progressing   Problem: Education: Goal: Will be free of  psychotic symptoms Outcome: Not Progressing Goal: Knowledge of the prescribed therapeutic regimen will improve Outcome: Not Progressing

## 2023-02-08 NOTE — Progress Notes (Signed)
Pt irritable with staff this evening. Pt denies SI/HI/AVH. Pt compliant with medication administration per MD orders. Pt given education, still refused. Pt observed interacting appropriately with staff and peers on the unit. Pt being monitored Q 15 minutes for safety per unit protocol, remains safe on the unit

## 2023-02-09 DIAGNOSIS — F315 Bipolar disorder, current episode depressed, severe, with psychotic features: Secondary | ICD-10-CM

## 2023-02-09 LAB — LIPID PANEL
Cholesterol: 213 mg/dL — ABNORMAL HIGH (ref 0–200)
HDL: 47 mg/dL (ref >40)
LDL Cholesterol: 142 mg/dL — ABNORMAL HIGH (ref 0–99)
Total CHOL/HDL Ratio: 4.5 {ratio}
Triglycerides: 121 mg/dL (ref <150)
VLDL: 24 mg/dL (ref 0–40)

## 2023-02-09 LAB — HEMOGLOBIN A1C
Hgb A1c MFr Bld: 5.5 % (ref 4.8–5.6)
Mean Plasma Glucose: 111.15 mg/dL

## 2023-02-09 MED ORDER — ENSURE MAX PROTEIN PO LIQD
11.0000 [oz_av] | Freq: Two times a day (BID) | ORAL | Status: DC
  Administered 2023-02-10: 11 [oz_av] via ORAL
  Filled 2023-02-09: qty 330

## 2023-02-09 NOTE — Progress Notes (Signed)
Pt irritable with staff this evening. Pt denies SI/HI/AVH. Pt compliant with medication administration per MD orders. Pt given education, still refused. Pt observed interacting appropriately with staff and peers on the unit. Pt being monitored Q 15 minutes for safety per unit protocol, remains safe on the unit

## 2023-02-09 NOTE — Group Note (Signed)
Date:  02/09/2023 Time:  3:34 PM  Group Topic/Focus:  Activity Group:  The focus of the group is to promote activity for the patients to encourage them to go outside to the courtyard for some fresh air and some exercise.    Participation Level:  Active  Participation Quality:  Appropriate  Affect:  Appropriate  Cognitive:  Appropriate  Insight: Appropriate  Engagement in Group:  Engaged  Modes of Intervention:  Activity  Additional Comments:    Shannon [name] 02/09/2023, 3:34 PM

## 2023-02-09 NOTE — Progress Notes (Signed)
[facility] MD Progress Note  02/09/2023 3:39 PM Shannon [name]  MRN:  [id number]  Subjective:  Notes, labs, vital signs reviewed, and progression team completed.  The client is currently houseless and refuses to go to a shelter, "that won't work for me."  Depression is "getting a little better" which she rates at a moderate level, denies suicidal ideations.  Anxiety is "always there, always stressed about something", mild to moderate level with no recent panic attacks.  Her sleep was "fine", appetite is "ok".  She is working with the Child psychotherapist for discharge planning.  Denies side effects from her medications.  Principal Problem: Bipolar disorder, current episode depressed, severe, with psychotic features (HCC) Diagnosis: Principal Problem:   Bipolar disorder, current episode depressed, severe, with psychotic features (HCC)  Total Time spent with patient: 30 minutes  Past Psychiatric History: bipolar d/o  Past Medical History:  Past Medical History:  Diagnosis Date   Allergic rhinitis    Allergic rhinitis 01/25/2009        ANEMIA-IRON DEFICIENCY 01/25/2009   Anxiety    Backache 01/25/2009   Qualifier: Diagnosis of  By: Jonny Ruiz MD, Len Blalock    Bipolar 1 disorder [facility])    Chronic pain syndrome    Complete rotator cuff tear 07/21/2013   Complete tear of right rotator cuff 07/21/2013   Contact lens/glasses fitting    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   DISC DISEASE, LUMBAR 01/25/2009   Essential hypertension 01/25/2009   On Bystolic    GAD (generalized anxiety disorder) 12/28/2013   GERD (gastroesophageal reflux disease)    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   Headache, acute 12/20/2013   Hx of laparoscopic gastric banding 09/03/2010   Surgery date: 07/17/10    HYPERLIPIDEMIA 01/25/2009   Hyperlipidemia with target LDL less than 130 01/25/2009   HYPERTENSION 01/25/2009   Hypokalemia, inadequate intake 09/01/2011   Insomnia 04/04/2011   INSOMNIA-SLEEP DISORDER-UNSPEC 04/30/2009    Qualifier: Diagnosis of  By: Jonny Ruiz MD, Len Blalock    Iron deficiency anemia 01/25/2009        Labyrinthitis 02/19/2014   Leiomyoma of uterus, unspecified 08/13/2008   Overview:  Leiomyoma Of The Uterus  10/1 IMO update   Localized edema 06/12/2015   MANIC DEPRESSIVE ILLNESS 01/25/2009   pt is unsue if this is her specifc dx   Mixed bipolar I disorder (HCC) 07/12/2012   Morbid obesity (HCC) 01/25/2009   Night sweats    Osteopenia determined by x-ray 06/13/2021   Other abnormal glucose 10/26/2012   Palpitations 10/25/2013   PEPTIC ULCER DISEASE, HELICOBACTER PYLORI POSITIVE 10/03/2009   Peripheral edema 06/25/2015   Pernicious anemia 03/28/2012   Piriformis syndrome of both sides 09/02/2018   PUD (peptic ulcer disease) 10/03/2009        Right shoulder pain 05/03/2013   Dg Shoulder Right  05/03/2013   CLINICAL DATA Pain.  EXAM RIGHT SHOULDER - 2+ VIEW  COMPARISON None.  FINDINGS Acromioclavicular and glenohumeral degenerative change present. Questionable calcific density noted in the region of the supraspinatus space, possibly representing calcific supraspinatus tendinitis. This could represent a sclerotic density in the acromion. MRI of the right shoulder suggest   Sleep apnea 08/12/2016   SMOKER 01/25/2009   Qualifier: Diagnosis of  By: Jonny Ruiz MD, Len Blalock    Subacromial bursitis 05/17/2013   SUBSTANCE ABUSE 11/06/2009        Thiamin deficiency 10/30/2012   Tobacco abuse 06/09/2010   Visual disturbance 05/03/2013   Vitamin D deficiency 10/27/2012  Past Surgical History:  Procedure Laterality Date   ABDOMINAL HYSTERECTOMY     BLADDER SURGERY     s/p with ?diverticulitis   HAND TENDON SURGERY  1991   s/p-Right-index and middle   LAPAROSCOPIC GASTRIC BAND REMOVAL WITH LAPAROSCOPIC GASTRIC SLEEVE RESECTION N/A 08/17/2016   Procedure: LAPAROSCOPIC GASTRIC BAND REMOVAL WITH LAPAROSCOPIC GASTRIC SLEEVE RESECTION WITH UPPER ENDO;  Surgeon: Ovidio Kin, MD;  Location: [facility];  Service: General;  Laterality: N/A;    LAPAROSCOPIC GASTRIC BANDING  07/14/10   SHOULDER ARTHROSCOPY Right 07/21/2013   Procedure: RIGHT ARTHROSCOPY SHOULDER DEBRIDMENT EXTENTSIVE,ARTHROSCOPIC REMOVE LOOSE FOREIGN BODY, BICEPS TENOLYSIS ;  Surgeon: Eulas Post, MD;  Location: [facility];  Service: Orthopedics;  Laterality: Right;   TUBAL LIGATION     Family History:  Family History  Problem Relation Age of Onset   Hypertension Mother    Stroke Father    Cirrhosis Father        ETOH   Hypertension Father    Diabetes Father    Alcohol abuse Father    Hypertension Sister    Asthma Sister    Hypertension Brother    Hypertension Paternal Aunt    Diabetes Maternal Grandmother    ADD / ADHD Son    ADD / ADHD Other        3 nephews, also emotional issues   Diabetes Other        Uncle   Diabetes Other        Aunt   Suicidality Neg Hx    Family Psychiatric  History: see above Social History:  Social History   Substance and Sexual Activity  Alcohol Use No   Alcohol/week: 0.0 standard drinks of alcohol     Social History   Substance and Sexual Activity  Drug Use No    Social History   Socioeconomic History   Marital status: Divorced    Spouse name: Not on file   Number of children: 2   Years of education: Not on file   Highest education level: Professional school degree (e.g., MD, DDS, DVM, JD)  Occupational History   Occupation: STUDENT    Employer: UNEMPLOYED  Tobacco Use   Smoking status: Every Day    Current packs/day: 0.25    Average packs/day: 0.3 packs/day for 20.0 years (5.0 ttl pk-yrs)    Types: Cigarettes   Smokeless tobacco: Never   Tobacco comments:    States has cut back to 5-7 a day and working on this.   Vaping Use   Vaping status: Never Used  Substance and Sexual Activity   Alcohol use: No    Alcohol/week: 0.0 standard drinks of alcohol   Drug use: No   Sexual activity: Not Currently  Other Topics Concern   Not on file  Social History Narrative   Moved from [place], then [place] to [place] 2009   2 children-1 boy, 1 girl   Disabled-bipolar   Daily Caffeine Use-1 cup/day      Right Handed   Lives in 2nd floor condo   Social Drivers of Health   Financial Resource Strain: High Risk (01/01/2023)   Overall Financial Resource Strain (CARDIA)    Difficulty of Paying Living Expenses: Very hard  Food Insecurity: No Food Insecurity (02/04/2023)   Hunger Vital Sign    Worried About Running Out of Food in the Last Year: Never true    Ran Out of Food in the Last Year: Never true  Recent Concern: Food  Insecurity - Food Insecurity Present (01/28/2023)   Hunger Vital Sign    Worried About Running Out of Food in the Last Year: Sometimes true    Ran Out of Food in the Last Year: Sometimes true  Transportation Needs: No Transportation Needs (02/04/2023)   PRAPARE - Administrator, Civil Service (Medical): No    Lack of Transportation (Non-Medical): No  Physical Activity: Inactive (01/01/2023)   Exercise Vital Sign    Days of Exercise per Week: 0 days    Minutes of Exercise per Session: 0 min  Stress: Stress Concern Present (01/01/2023)   [employer] of Occupational Health - Occupational Stress Questionnaire    Feeling of Stress : Very much  Social Connections: Socially Isolated (01/01/2023)   Social Connection and Isolation Panel [NHANES]    Frequency of Communication with Friends and Family: Never    Frequency of Social Gatherings with Friends and Family: Never    Attends Religious Services: 1 to 4 times per year    Active Member of [employer] or Organizations: No    Attends Engineer, structural: Not on file    Marital Status: Separated   Additional Social History:  houseless    Sleep: Good  Appetite:  Fair  Current Medications: Current Facility-Administered Medications  Medication Dose Route Frequency Provider Last Rate Last Admin   acetaminophen (TYLENOL) tablet 650 mg  650 mg Oral Q6H PRN Starkes-Perry, Juel Burrow, FNP        alum & mag hydroxide-simeth (MAALOX/MYLANTA) 200-200-20 MG/5ML suspension 30 mL  30 mL Oral Q4H PRN Starkes-Perry, Juel Burrow, FNP       busPIRone (BUSPAR) tablet 10 mg  10 mg Oral BID Myriam Forehand, NP   10 mg at 02/09/23 0981   cyanocobalamin (VITAMIN B12) tablet 1,000 mcg  1,000 mcg Oral Daily Maryagnes Amos, FNP   1,000 mcg at 02/09/23 0947   haloperidol (HALDOL) tablet 5 mg  5 mg Oral TID PRN Maryagnes Amos, FNP   5 mg at 02/05/23 1252   And   diphenhydrAMINE (BENADRYL) capsule 50 mg  50 mg Oral TID PRN Maryagnes Amos, FNP   50 mg at 02/05/23 1252   feeding supplement (ENSURE ENLIVE / ENSURE PLUS) liquid 237 mL  237 mL Oral BID BM Maryagnes Amos, FNP   237 mL at 02/09/23 1424   gabapentin (NEURONTIN) capsule 600 mg  600 mg Oral TID Myriam Forehand, NP   600 mg at 02/09/23 1424   hydrochlorothiazide (HYDRODIURIL) tablet 6.25 mg  6.25 mg Oral Daily Maryagnes Amos, FNP   6.25 mg at 02/09/23 0946   hydrOXYzine (ATARAX) tablet 50 mg  50 mg Oral QHS Maryagnes Amos, FNP   50 mg at 02/08/23 2058   magnesium hydroxide (MILK OF MAGNESIA) suspension 30 mL  30 mL Oral Daily PRN Maryagnes Amos, FNP   30 mL at 02/04/23 1825   multivitamin with minerals tablet 1 tablet  1 tablet Oral BID WC Maryagnes Amos, FNP   1 tablet at 02/09/23 0946   nicotine polacrilex (NICORETTE) gum 2 mg  2 mg Oral PRN Maryagnes Amos, FNP       Oxcarbazepine (TRILEPTAL) tablet 300 mg  300 mg Oral BID Maryagnes Amos, FNP   300 mg at 02/09/23 0947   pantoprazole (PROTONIX) EC tablet 40 mg  40 mg Oral BID AC Maryagnes Amos, FNP   40 mg at 02/09/23 0947   polyethylene glycol (  MIRALAX / GLYCOLAX) packet 17 g  17 g Oral Daily Maryagnes Amos, FNP   17 g at 02/09/23 0946   potassium chloride SA (KLOR-CON M) CR tablet 20 mEq  20 mEq Oral Daily Lolita Patella B, MD   20 mEq at 02/09/23 0947   pregabalin (LYRICA) capsule 75 mg  75 mg Oral BID  Lolita Patella B, MD   75 mg at 02/09/23 0947   QUEtiapine (SEROQUEL) tablet 25 mg  25 mg Oral BID Maryagnes Amos, FNP   25 mg at 02/09/23 0947   QUEtiapine (SEROQUEL) tablet 400 mg  400 mg Oral QHS Myriam Forehand, NP   400 mg at 02/08/23 2058   simethicone (MYLICON) 40 MG/0.6ML suspension 40 mg  40 mg Oral QID PRN Maryagnes Amos, FNP       topiramate (TOPAMAX) tablet 50 mg  50 mg Oral BID Myriam Forehand, NP   50 mg at 02/09/23 4401   traMADol (ULTRAM) tablet 100 mg  100 mg Oral Q6H PRN Myriam Forehand, NP   100 mg at 02/09/23 0272    Lab Results:  Results for orders placed or performed during the hospital encounter of 02/04/23 (from the past 48 hours)  Lipid panel     Status: Abnormal   Collection Time: 02/09/23  6:10 AM  Result Value Ref Range   Cholesterol 213 (H) 0 - 200 mg/dL   Triglycerides 536 <644 mg/dL   HDL 47 >03 mg/dL   Total CHOL/HDL Ratio 4.5 RATIO   VLDL 24 0 - 40 mg/dL   LDL Cholesterol 474 (H) 0 - 99 mg/dL    Comment:        Total Cholesterol/HDL:CHD Risk Coronary Heart Disease Risk Table                     Men   Women  1/2 Average Risk   3.4   3.3  Average Risk       5.0   4.4  2 X Average Risk   9.6   7.1  3 X Average Risk  23.4   11.0        Use the calculated Patient Ratio above and the CHD Risk Table to determine the patient's CHD Risk.        ATP III CLASSIFICATION (LDL):  <100     mg/dL   Optimal  259-563  mg/dL   Near or Above                    Optimal  130-159  mg/dL   Borderline  875-643  mg/dL   High  >329     mg/dL   Very High Performed at [facility], [address] [place], [place] [address]   Hemoglobin A1c     Status: None   Collection Time: 02/09/23  6:10 AM  Result Value Ref Range   Hgb A1c MFr Bld 5.5 4.8 - 5.6 %    Comment: (NOTE) Pre diabetes:          5.7%-6.4%  Diabetes:              >6.4%  Glycemic control for   <7.0% adults with diabetes    Mean Plasma Glucose 111.15 mg/dL    Comment:  Performed at [facility], [address] [place], [place] [address]   *Note: Due to a large number of results and/or encounters for the requested time period, some results  have not been displayed. A complete set of results can be found in Results Review.    Blood Alcohol level:  Lab Results  Component Value Date   [facility] <10 01/01/2023   ETH <11 07/11/2012    Metabolic Disorder Labs: Lab Results  Component Value Date   HGBA1C 5.5 02/09/2023   MPG 111.15 02/09/2023   MPG 111.15 01/29/2023   No results found for: "PROLACTIN" Lab Results  Component Value Date   CHOL 213 (H) 02/09/2023   TRIG 121 02/09/2023   HDL 47 02/09/2023   CHOLHDL 4.5 02/09/2023   VLDL 24 02/09/2023   LDLCALC 142 (H) 02/09/2023   LDLCALC 160 (H) 05/14/2021    Physical Findings: AIMS:  , ,  ,  ,    CIWA:    COWS:     Musculoskeletal: Strength & Muscle Tone: within normal limits Gait & Station: normal Patient leans: N/A Psychiatric Specialty Exam: Physical Exam Vitals and nursing note reviewed.  Constitutional:      Appearance: Normal appearance.  HENT:     Head: Normocephalic.     Nose: Nose normal.  Pulmonary:     Effort: Pulmonary effort is normal.  Musculoskeletal:        General: Normal range of motion.     Cervical back: Normal range of motion.  Neurological:     General: No focal deficit present.     Mental Status: She is alert and oriented to person, place, and time.     Review of Systems  Psychiatric/Behavioral:  Positive for depression. The patient is nervous/anxious.   All other systems reviewed and are negative.   Blood pressure 139/80, pulse (!) 104, temperature 98.6 F (37 C), temperature source Oral, resp. rate 20, height 5\' 4"  (1.626 m), weight 104.3 kg, SpO2 96%.Body mass index is 39.48 kg/m.  General Appearance: Casual  Eye Contact:  Good  Speech:  Normal Rate  Volume:  Normal  Mood:  Anxious and Depressed  Affect:  Congruent  Thought Process:  Coherent   Orientation:  Full (Time, Place, and Person)  Thought Content:  Logical  Suicidal Thoughts:  No  Homicidal Thoughts:  No  Memory:  Immediate;   Fair Recent;   Fair Remote;   Fair  Judgement:  Fair  Insight:  Fair  Psychomotor Activity:  Normal  Concentration:  Concentration: Good and Attention Span: Good  Recall:  Good  Fund of Knowledge:  Good  Language:  Good  Akathisia:  No  Handed:  Right  AIMS (if indicated):     Assets:  Leisure Time Resilience Social Support  ADL's:  Intact  Cognition:  WNL  Sleep:        Physical Exam: Physical Exam Vitals and nursing note reviewed.  Constitutional:      Appearance: Normal appearance.  HENT:     Head: Normocephalic.     Nose: Nose normal.  Pulmonary:     Effort: Pulmonary effort is normal.  Musculoskeletal:        General: Normal range of motion.     Cervical back: Normal range of motion.  Neurological:     General: No focal deficit present.     Mental Status: She is alert and oriented to person, place, and time.    Review of Systems  Psychiatric/Behavioral:  Positive for depression. The patient is nervous/anxious.   All other systems reviewed and are negative.  Blood pressure 139/80, pulse (!) 104, temperature 98.6 F (37 C), temperature source Oral, resp. rate 20,  height 5\' 4"  (1.626 m), weight 104.3 kg, SpO2 96%. Body mass index is 39.48 kg/m.   Treatment Plan Summary: Daily contact with patient to assess and evaluate symptoms and progress in treatment, Medication management, and Plan : Bipolar affective disorder, depressed, moderate: Seroquel 25 mg BID and 400 mg at bedtime Trileptal 300 mg BID  General anxiety d/o: Buspar 10 mg BID  Insomnia: Hydroxyzine 50 mg at bedtime PRN  Nanine Means, NP 02/09/2023, 3:39 PM

## 2023-02-09 NOTE — Progress Notes (Signed)
Patient stated she wasn't going to take the TED hose off just to put them back on.   02/09/23 1400  Mechanical VTE Prophylaxis (All Areas)  Mechanical VTE Prophylaxis Intervention On (patient refuses to take them off)

## 2023-02-09 NOTE — Group Note (Signed)
LCSW Group Therapy Note   Group Date: 02/09/2023 Start Time: 1300 End Time: 1405   Type of Therapy and Topic:  Group Therapy: Challenging Core Beliefs  Participation Level:  Active  Description of Group:  Patients were educated about core beliefs and asked to identify one harmful core belief that they have. Patients were asked to explore from where those beliefs originate. Patients were asked to discuss how those beliefs make them feel and the resulting behaviors of those beliefs. They were then be asked if those beliefs are true and, if so, what evidence they have to support them. Lastly, group members were challenged to replace those negative core beliefs with helpful beliefs.   Therapeutic Goals:   1. Patient will identify harmful core beliefs and explore the origins of such beliefs. 2. Patient will identify feelings and behaviors that result from those core beliefs. 3. Patient will discuss whether such beliefs are true. 4.  Patient will replace harmful core beliefs with helpful ones.  Summary of Patient Progress:  Patient actively engaged in processing and exploring how core beliefs are formed and how they impact thoughts, feelings, and behaviors. Patient proved open to input from peers and feedback from CSW. Patient demonstrated proficient insight into the subject matter, was respectful and supportive of peers, and participated throughout the entire session.  Therapeutic Modalities: Cognitive Behavioral Therapy; Solution-Focused Therapy   [name], [name] 02/09/2023  2:41 PM

## 2023-02-09 NOTE — Plan of Care (Signed)
  Problem: Education: Goal: Knowledge of  General Education information/materials will improve Outcome: Progressing Goal: Emotional status will improve Outcome: Progressing Goal: Mental status will improve Outcome: Progressing Goal: Verbalization of understanding the information provided will improve Outcome: Progressing   Problem: Activity: Goal: Interest or engagement in activities will improve Outcome: Progressing Goal: Sleeping patterns will improve Outcome: Progressing   Problem: Coping: Goal: Ability to verbalize frustrations and anger appropriately will improve Outcome: Progressing Goal: Ability to demonstrate self-control will improve Outcome: Progressing   Problem: Health Behavior/Discharge Planning: Goal: Identification of resources available to assist in meeting health care needs will improve Outcome: Progressing Goal: Compliance with treatment plan for underlying cause of condition will improve Outcome: Progressing   Problem: Physical Regulation: Goal: Ability to maintain clinical measurements within normal limits will improve Outcome: Progressing   Problem: Safety: Goal: Periods of time without injury will increase Outcome: Progressing   Problem: Education: Goal: Ability to make informed decisions regarding treatment will improve Outcome: Progressing   Problem: Coping: Goal: Coping ability will improve Outcome: Progressing   Problem: Self-Concept: Goal: Ability to disclose and discuss suicidal ideas will improve Outcome: Progressing   Problem: Coping: Goal: Coping ability will improve Outcome: Progressing Goal: Will verbalize feelings Outcome: Progressing   Problem: Education: Goal: Will be free of psychotic symptoms Outcome: Progressing Goal: Knowledge of the prescribed therapeutic regimen will improve Outcome: Progressing

## 2023-02-09 NOTE — Group Note (Signed)
Recreation Therapy Group Note   Group Topic:Other  Group Date: 02/09/2023 Start Time: 1530 End Time: 1630 Facilitators: Clinton Gallant, CTRS Location:  Craft Room  LRT, nurses, social workers, Facilities manager, and patients got together for [employer]. LRT provided Christmas theme coloring pages, as well. Everyone voluntarily took turns singing and talking about their holiday traditions.    Affect/Mood: Appropriate   Participation Level: Active and Engaged   Modes of Intervention: Activity    Clinical Observations/Individualized Feedback: Shannon [name] originally came into group irritated "that we weren't given the courtesy of 48 hours" to know that she was being discharged. Pt spoke with nurse manager and was able to clam herself down. Pt was singing karaoke with peers moments later.   Plan: Continue to engage patient in RT group sessions 2-3x/week.   Shannon [name], LRT, CTRS 02/09/2023 5:43 PM

## 2023-02-09 NOTE — Progress Notes (Addendum)
Initial Nutrition Assessment  DOCUMENTATION CODES:   Obesity unspecified  INTERVENTION:   Ensure Max protein supplement po BID, each supplement provides 150kcal and 30g of protein.  MVI po daily   Diet education (high protein/post bariatric)   NUTRITION DIAGNOSIS:   Increased nutrient needs related to other (see comment) (h/o gastric sleep, lymphedema) as evidenced by estimated needs.  GOAL:   Patient will meet greater than or equal to 90% of their needs  MONITOR:   PO intake, Supplement acceptance  REASON FOR ASSESSMENT:   Consult Diet education  ASSESSMENT:   57 y/o female with h/o bipolar disorder, PTSD, SI, HLD, IDA, HTN, chronic pain, anxiety, GERD, substance abuse, PUD, lymphedema and morbid obesity s/p gastric band (placed 2012, removed 2018) & gastric sleeve (2018) who is now admitted with bilateral lower extremity edema, weakness and SI.  Met with pt in room today. Pt reports good appetite and oral intake at baseline. Pt reports that she is not a big eater secondary to early satiety from her gastric sleeve surgery in 2018. Pt reports losing ~95lbs down from her heaviest weight of 315lbs since having her surgery. Pt reports that she tries to eat greek yogurt and fish to get in enough protein. Pt is currently homeless and is unsure of where she will live after discharge. Pt reports that she has been drinking Ensure in the hospital but reports that it gives her constipation and reports that she is unable to afford this at home. Pt has not been compliant with taking her bariatric multivitamins. Pt is eating well in hospital; pt documented to be eating 80-100% of meals.   RD discussed with pt the importance of adequate nutrition needed to preserve lean muscle. RD discussed hypoalbuminemia, edema and how protein intake effects this and its relation to her previous sleeve surgery. Pt reports a history of cirrhosis; RD is unable to find documentation of this in chart however did  discuss how proteins are processed in the liver and how cirrhosis may effect albumin levels. Recommended for patient to aim for at least 80g of protein daily. Discussed ways in which patient can increase protein in her diet without having to buy expensive protein supplements but did provide Ensure coupons just in case. Recommended for patient to take a daily MVI. Recommended for patient to try and eat smaller meals with high protein snacks in between. RD will change Ensure Enlive to Ensure Max protein to provide more protein and less calories (prefers strawberry). Recommend continue daily MVI.   RD discussed with patient the importance of exercise needed to help with circulation and how this can improve lymphedema and mobility. Recommended for patient to try and aim for ~30 minutes per day of non intensive exercise. Of note, pt reports that she would like to stop taking her diuretics; RD recommended for patient to discuss with her PCP before stopping any medications.   Medications reviewed and include: hydrochlorothiazide, B12, MVI, protonix, miralax, KCl  Labs reviewed: none recent   Diet Order:   Diet Order             Diet regular Room service appropriate? Yes; Fluid consistency: Thin  Diet effective now                  EDUCATION NEEDS:   Education needs have been addressed  Skin:  Skin Assessment: Reviewed RN Assessment  Last BM:  12/15- constipation  Height:   Ht Readings from Last 1 Encounters:  02/04/23 5\' 4"  (1.626  m)    Weight:   Wt Readings from Last 1 Encounters:  02/04/23 104.3 kg   BMI:  Body mass index is 39.48 kg/m.  Estimated Nutritional Needs:   Kcal:  1800-2100kcal/day  Protein:  90-105g/day  Fluid:  1.7-1.9L/day  Betsey Holiday MS, RD, LDN If unable to be reached, please send secure chat to "RD inpatient" available from 8:00a-4:00p daily

## 2023-02-09 NOTE — Plan of Care (Signed)
Pt denies SI/HI/AVH, compliant with procedures on the unit  Problem: Education: Goal: Knowledge of [facility] Education information/materials will improve Outcome: Progressing Goal: Emotional status will improve Outcome: Progressing Goal: Mental status will improve Outcome: Progressing Goal: Verbalization of understanding the information provided will improve Outcome: Progressing   Problem: Activity: Goal: Interest or engagement in activities will improve Outcome: Progressing Goal: Sleeping patterns will improve Outcome: Progressing   Problem: Coping: Goal: Ability to verbalize frustrations and anger appropriately will improve Outcome: Progressing Goal: Ability to demonstrate self-control will improve Outcome: Progressing   Problem: Health Behavior/Discharge Planning: Goal: Identification of resources available to assist in meeting health care needs will improve Outcome: Progressing Goal: Compliance with treatment plan for underlying cause of condition will improve Outcome: Progressing   Problem: Physical Regulation: Goal: Ability to maintain clinical measurements within normal limits will improve Outcome: Progressing   Problem: Safety: Goal: Periods of time without injury will increase Outcome: Progressing   Problem: Education: Goal: Ability to make informed decisions regarding treatment will improve Outcome: Progressing   Problem: Coping: Goal: Coping ability will improve Outcome: Progressing   Problem: Self-Concept: Goal: Ability to disclose and discuss suicidal ideas will improve Outcome: Progressing Goal: Will verbalize positive feelings about self Outcome: Progressing Note: Patient is on track. Patient will maintain adherence    Problem: Coping: Goal: Coping ability will improve Outcome: Progressing Goal: Will verbalize feelings Outcome: Progressing   Problem: Education: Goal: Will be free of psychotic symptoms Outcome: Progressing Goal:  Knowledge of the prescribed therapeutic regimen will improve Outcome: Progressing

## 2023-02-09 NOTE — Group Note (Signed)
Recreation Therapy Group Note   Group Topic:Goal Setting  Group Date: 02/09/2023 Start Time: 1000 End Time: 1100 Facilitators: Rosina Lowenstein, LRT, CTRS Location:  Craft Room  Group Description: Product/process development scientist. Patients were given many different magazines, a glue stick, markers, and a piece of cardstock paper. LRT and pts discussed the importance of having goals in life. LRT and pts discussed the difference between short-term and long-term goals, as well as what a SMART goal is. LRT encouraged pts to create a vision board, with images they picked and then cut out with safety scissors from the magazine, for themselves, that capture their short and long-term goals. LRT encouraged pts to show and explain their vision board to the group.   Goal Area(s) Addressed:  Patient will gain knowledge of short vs. long term goals.  Patient will identify goals for themselves. Patient will practice setting SMART goals. Patient will verbalize their goals to LRT and peers.   Affect/Mood: Appropriate   Participation Level: Active and Engaged   Participation Quality: Independent   Behavior: Appropriate, Calm, and Cooperative   Speech/Thought Process: Coherent   Insight: Good   Judgement: Good   Modes of Intervention: Art, Education, Exploration, Guided Discussion, and Support   Patient Response to Interventions:  Attentive, Engaged, Interested , and Receptive   Education Outcome:  Acknowledges education   Clinical Observations/Individualized Feedback: Shannon [name] was active in their participation of session activities and group discussion. 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 I also want to travel to [place] and work on my health issues" as her goals. Pt interacted well with LRT and peers duration of session.    Plan: Continue to engage patient in RT group sessions 2-3x/week.   Rosina Lowenstein, LRT, CTRS 02/09/2023 12:53 PM

## 2023-02-09 NOTE — Group Note (Signed)
Date:  02/09/2023 Time:  10:53 PM  Group Topic/Focus:  Wrap-Up Group:   The focus of this group is to help patients review their daily goal of treatment and discuss progress on daily workbooks.    Participation Level:  Active  Participation Quality:  Appropriate, Attentive, Intrusive, Sharing, and Supportive  Affect:  Appropriate, Blunted, Flat, Irritable, and Resistant  Cognitive:  Alert and Appropriate  Insight: Appropriate and Good  Engagement in Group:  Engaged  Modes of Intervention:  Discussion, Support, and    Additional Comments:  Pt expressed concern over the encounter she had with doctor today. When staff addressed the group about starting Wrap Up group for the evening this pt stated,"Why do group when we were all told we are done and being let go because our time is over". When asked what that meant, this pt as well as others in the room made comments that the provider today told a lot of them that they were going to be discharged regardless of their situation and possible concerns not being met. This pt also stated that "I have been to multiple facilities and this one is the worst. I have not been given appropriate care the whole time I have been here. Nothing has been done." Pt goes on to state "I do not have any where to go and live when I am discharged but no one here cares. However, I would rather leave and be homeless then stay here. Its sad when a patient would rather leave then stay."   Maglione,Annaliyah Willig E 02/09/2023, 10:53 PM

## 2023-02-09 NOTE — Progress Notes (Signed)
   02/09/23 1700  Psych Admission Type (Psych Patients Only)  Admission Status Voluntary  Psychosocial Assessment  Patient Complaints Anxiety;Depression;Other (Comment);Worrying (hallucinations; patient reports it's getting better.)  Eye Contact Fair  Facial Expression Anxious;Worried (Patient is worried about her bilateral lower extremity edema and that it's not getting better.)  Affect Appropriate to circumstance  Speech Logical/coherent;Loud (loud at times)  Interaction Assertive  Motor Activity Slow  Appearance/Hygiene Unremarkable;Layered clothes  Behavior Characteristics Cooperative;Appropriate to situation  Mood Pleasant  Aggressive Behavior  Effect No apparent injury  Thought Process  Coherency WDL  Content Blaming others  Delusions None reported or observed  Perception Hallucinations  Hallucination Auditory  Judgment WDL  Confusion None  Danger to Self  Current suicidal ideation? Denies  Self-Injurious Behavior No self-injurious ideation or behavior indicators observed or expressed   Agreement Not to Harm Self Yes  Description of Agreement Verbal  Danger to Others  Danger to Others None reported or observed

## 2023-02-10 DIAGNOSIS — F315 Bipolar disorder, current episode depressed, severe, with psychotic features: Secondary | ICD-10-CM | POA: Diagnosis not present

## 2023-02-10 MED ORDER — QUETIAPINE FUMARATE 25 MG PO TABS: 25.0000 mg | ORAL_TABLET | Freq: Two times a day (BID) | ORAL | 0 refills | Status: DC

## 2023-02-10 MED ORDER — OXCARBAZEPINE 300 MG PO TABS: 300.0000 mg | ORAL_TABLET | Freq: Two times a day (BID) | ORAL | 0 refills | Status: DC

## 2023-02-10 MED ORDER — QUETIAPINE FUMARATE 400 MG PO TABS: 400.0000 mg | ORAL_TABLET | Freq: Every day | ORAL | 0 refills | Status: DC

## 2023-02-10 MED ORDER — TOPIRAMATE 50 MG PO TABS: 50.0000 mg | ORAL_TABLET | Freq: Two times a day (BID) | ORAL | 0 refills | Status: DC

## 2023-02-10 MED ORDER — BUSPIRONE HCL 10 MG PO TABS: 10.0000 mg | ORAL_TABLET | Freq: Two times a day (BID) | ORAL | 0 refills | Status: DC

## 2023-02-10 NOTE — [facility] Suicide Risk Assessment (Signed)
[facility] Discharge Suicide Risk Assessment   Principal Problem: Bipolar disorder, current episode depressed, severe, with psychotic features [facility]) Discharge Diagnoses: Principal Problem:   Bipolar disorder, current episode depressed, severe, with psychotic features (HCC)   Total Time spent with patient: 45 minutes  Musculoskeletal: Strength & Muscle Tone: within normal limits Gait & Station: normal Patient leans: N/A  Psychiatric Specialty Exam: Physical Exam Vitals and nursing note reviewed.  Constitutional:      Appearance: Normal appearance.  HENT:     Head: Normocephalic.     Nose: Nose normal.  Pulmonary:     Effort: Pulmonary effort is normal.  Musculoskeletal:        General: Normal range of motion.     Cervical back: Normal range of motion.  Neurological:     General: No focal deficit present.     Mental Status: She is alert and oriented to person, place, and time.     Review of Systems  Psychiatric/Behavioral:  The patient is nervous/anxious.   All other systems reviewed and are negative.   Blood pressure 136/77, pulse (!) 103, temperature 98.5 F (36.9 C), temperature source Oral, resp. rate 18, height 5\' 4"  (1.626 m), weight 104.3 kg, SpO2 98%.Body mass index is 39.48 kg/m.  General Appearance: Casual  Eye Contact:  Good  Speech:  Normal Rate  Volume:  Normal  Mood:  Anxious  Affect:  Congruent  Thought Process:  Coherent  Orientation:  Full (Time, Place, and Person)  Thought Content:  Logical  Suicidal Thoughts:  No  Homicidal Thoughts:  No  Memory:  Immediate;   Good Recent;   Good Remote;   Good  Judgement:  Good  Insight:  Good  Psychomotor Activity:  Normal  Concentration:  Concentration: Good and Attention Span: Good  Recall:  Good  Fund of Knowledge:  Good  Language:  Good  Akathisia:  No  Handed:  Right  AIMS (if indicated):     Assets:  Housing Leisure Time Resilience  ADL's:  Intact  Cognition:  WNL  Sleep:        Physical  Exam: Physical Exam Vitals and nursing note reviewed.  Constitutional:      Appearance: Normal appearance.  HENT:     Head: Normocephalic.     Nose: Nose normal.  Pulmonary:     Effort: Pulmonary effort is normal.  Musculoskeletal:        General: Normal range of motion.     Cervical back: Normal range of motion.  Neurological:     General: No focal deficit present.     Mental Status: She is alert and oriented to person, place, and time.    Review of Systems  Psychiatric/Behavioral:  The patient is nervous/anxious.   All other systems reviewed and are negative.  Blood pressure 136/77, pulse (!) 103, temperature 98.5 F (36.9 C), temperature source Oral, resp. rate 18, height 5\' 4"  (1.626 m), weight 104.3 kg, SpO2 98%. Body mass index is 39.48 kg/m.  Mental Status Per Nursing Assessment::   On Admission:  Suicidal ideation indicated by patient  Demographic Factors:  Low socioeconomic status  Loss Factors: NA  Historical Factors: NA  Risk Reduction Factors:   Positive social support, Positive therapeutic relationship, and Positive coping skills or problem solving skills  Continued Clinical Symptoms:  Anxiety, mild  Cognitive Features That Contribute To Risk:  None    Suicide Risk:  Minimal: No identifiable suicidal ideation.  Patients presenting with no risk factors but with morbid  ruminations; may be classified as minimal risk based on the severity of the depressive symptoms   Plan Of Care/Follow-up recommendations:  Activity:  as tolerated Diet:  follow up with outpatient provider Bipolar affective disorder, depressed, moderate: Seroquel 25 mg BID and 400 mg at bedtime Trileptal 300 mg BID   General anxiety d/o: Buspar 10 mg BID   Insomnia: Hydroxyzine 50 mg at bedtime PRN  Nanine Means, NP 02/10/2023, 12:11 PM

## 2023-02-10 NOTE — Discharge Summary (Signed)
Physician Discharge Summary Note  Patient:  Shannon [name] is an 57 y.o., female MRN:  [id number] DOB:  02-03-1966 Patient phone:  [phone] (home)  Patient address:   [address] [place] [address],  Total Time spent with patient: {Time; 15 min - 8 hours:17441}  Date of Admission:  02/04/2023 Date of Discharge: ***  Reason for Admission:  ***  Principal Problem: Bipolar disorder, current episode depressed, severe, with psychotic features [facility]) Discharge Diagnoses: Principal Problem:   Bipolar disorder, current episode depressed, severe, with psychotic features [facility])   Past Psychiatric History: ***  Past Medical History:  Past Medical History:  Diagnosis Date   Allergic rhinitis    Allergic rhinitis 01/25/2009        ANEMIA-IRON DEFICIENCY 01/25/2009   Anxiety    Backache 01/25/2009   Qualifier: Diagnosis of  By: Jonny Ruiz MD, Len Blalock    Bipolar 1 disorder [facility])    Chronic pain syndrome    Complete rotator cuff tear 07/21/2013   Complete tear of right rotator cuff 07/21/2013   Contact lens/glasses fitting    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   DISC DISEASE, LUMBAR 01/25/2009   Essential hypertension 01/25/2009   On Bystolic    GAD (generalized anxiety disorder) 12/28/2013   GERD (gastroesophageal reflux disease)    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   Headache, acute 12/20/2013   Hx of laparoscopic gastric banding 09/03/2010   Surgery date: 07/17/10    HYPERLIPIDEMIA 01/25/2009   Hyperlipidemia with target LDL less than 130 01/25/2009   HYPERTENSION 01/25/2009   Hypokalemia, inadequate intake 09/01/2011   Insomnia 04/04/2011   INSOMNIA-SLEEP DISORDER-UNSPEC 04/30/2009   Qualifier: Diagnosis of  By: Jonny Ruiz MD, Len Blalock    Iron deficiency anemia 01/25/2009        Labyrinthitis 02/19/2014   Leiomyoma of uterus, unspecified 08/13/2008   Overview:  Leiomyoma Of The Uterus  10/1 IMO update   Localized edema 06/12/2015   MANIC DEPRESSIVE ILLNESS 01/25/2009    pt is unsue if this is her specifc dx   Mixed bipolar I disorder (HCC) 07/12/2012   Morbid obesity (HCC) 01/25/2009   Night sweats    Osteopenia determined by x-ray 06/13/2021   Other abnormal glucose 10/26/2012   Palpitations 10/25/2013   PEPTIC ULCER DISEASE, HELICOBACTER PYLORI POSITIVE 10/03/2009   Peripheral edema 06/25/2015   Pernicious anemia 03/28/2012   Piriformis syndrome of both sides 09/02/2018   PUD (peptic ulcer disease) 10/03/2009        Right shoulder pain 05/03/2013   Dg Shoulder Right  05/03/2013   CLINICAL DATA Pain.  EXAM RIGHT SHOULDER - 2+ VIEW  COMPARISON None.  FINDINGS Acromioclavicular and glenohumeral degenerative change present. Questionable calcific density noted in the region of the supraspinatus space, possibly representing calcific supraspinatus tendinitis. This could represent a sclerotic density in the acromion. MRI of the right shoulder suggest   Sleep apnea 08/12/2016   SMOKER 01/25/2009   Qualifier: Diagnosis of  By: Jonny Ruiz MD, Len Blalock    Subacromial bursitis 05/17/2013   SUBSTANCE ABUSE 11/06/2009        Thiamin deficiency 10/30/2012   Tobacco abuse 06/09/2010   Visual disturbance 05/03/2013   Vitamin D deficiency 10/27/2012    Past Surgical History:  Procedure Laterality Date   ABDOMINAL HYSTERECTOMY     BLADDER SURGERY     s/p with ?diverticulitis   HAND TENDON SURGERY  1991   s/p-Right-index and middle   LAPAROSCOPIC GASTRIC BAND REMOVAL WITH LAPAROSCOPIC GASTRIC  SLEEVE RESECTION N/A 08/17/2016   Procedure: LAPAROSCOPIC GASTRIC BAND REMOVAL WITH LAPAROSCOPIC GASTRIC SLEEVE RESECTION WITH UPPER ENDO;  Surgeon: Ovidio Kin, MD;  Location: [facility];  Service: General;  Laterality: N/A;   LAPAROSCOPIC GASTRIC BANDING  07/14/10   SHOULDER ARTHROSCOPY Right 07/21/2013   Procedure: RIGHT ARTHROSCOPY SHOULDER DEBRIDMENT EXTENTSIVE,ARTHROSCOPIC REMOVE LOOSE FOREIGN BODY, BICEPS TENOLYSIS ;  Surgeon: Eulas Post, MD;  Location: [facility];  Service:  Orthopedics;  Laterality: Right;   TUBAL LIGATION     Family History:  Family History  Problem Relation Age of Onset   Hypertension Mother    Stroke Father    Cirrhosis Father        ETOH   Hypertension Father    Diabetes Father    Alcohol abuse Father    Hypertension Sister    Asthma Sister    Hypertension Brother    Hypertension Paternal Aunt    Diabetes Maternal Grandmother    ADD / ADHD Son    ADD / ADHD Other        3 nephews, also emotional issues   Diabetes Other        Uncle   Diabetes Other        Aunt   Suicidality Neg Hx    Family Psychiatric  History: *** Social History:  Social History   Substance and Sexual Activity  Alcohol Use No   Alcohol/week: 0.0 standard drinks of alcohol     Social History   Substance and Sexual Activity  Drug Use No    Social History   Socioeconomic History   Marital status: Divorced    Spouse name: Not on file   Number of children: 2   Years of education: Not on file   Highest education level: Professional school degree (e.g., MD, DDS, DVM, JD)  Occupational History   Occupation: STUDENT    Employer: UNEMPLOYED  Tobacco Use   Smoking status: Every Day    Current packs/day: 0.25    Average packs/day: 0.3 packs/day for 20.0 years (5.0 ttl pk-yrs)    Types: Cigarettes   Smokeless tobacco: Never   Tobacco comments:    States has cut back to 5-7 a day and working on this.   Vaping Use   Vaping status: Never Used  Substance and Sexual Activity   Alcohol use: No    Alcohol/week: 0.0 standard drinks of alcohol   Drug use: No   Sexual activity: Not Currently  Other Topics Concern   Not on file  Social History Narrative   Moved from [place], then [place] to [place] 2009   2 children-1 boy, 1 girl   Disabled-bipolar   Daily Caffeine Use-1 cup/day      Right Handed   Lives in 2nd floor condo   Social Drivers of Health   Financial Resource Strain: High Risk (01/01/2023)   Overall Financial Resource Strain  (CARDIA)    Difficulty of Paying Living Expenses: Very hard  Food Insecurity: No Food Insecurity (02/04/2023)   Hunger Vital Sign    Worried About Running Out of Food in the Last Year: Never true    Ran Out of Food in the Last Year: Never true  Recent Concern: Food Insecurity - Food Insecurity Present (01/28/2023)   Hunger Vital Sign    Worried About Running Out of Food in the Last Year: Sometimes true    Ran Out of Food in the Last Year: Sometimes true  Transportation Needs: No Transportation Needs (02/04/2023)  PRAPARE - Administrator, Civil Service (Medical): No    Lack of Transportation (Non-Medical): No  Physical Activity: Inactive (01/01/2023)   Exercise Vital Sign    Days of Exercise per Week: 0 days    Minutes of Exercise per Session: 0 min  Stress: Stress Concern Present (01/01/2023)   [employer] of Occupational Health - Occupational Stress Questionnaire    Feeling of Stress : Very much  Social Connections: Socially Isolated (01/01/2023)   Social Connection and Isolation Panel [NHANES]    Frequency of Communication with Friends and Family: Never    Frequency of Social Gatherings with Friends and Family: Never    Attends Religious Services: 1 to 4 times per year    Active Member of [employer] or Organizations: No    Attends Engineer, structural: Not on file    Marital Status: Separated    Hospital Course:  ***  Physical Findings: AIMS:  , ,  ,  ,    CIWA:    COWS:     Musculoskeletal: Strength & Muscle Tone: {desc; muscle tone:32375} Gait & Station: {PE GAIT ED ZOXW:96045} Patient leans: {Patient Leans:21022755}   Psychiatric Specialty Exam:  Presentation  General Appearance:  Appropriate for Environment  Eye Contact: Good  Speech: Clear and Coherent  Speech Volume: Normal  Handedness: Right   Mood and Affect  Mood: Irritable  Affect: Blunt   Thought Process  Thought Processes: Coherent  Descriptions of  Associations:Circumstantial  Orientation:Full (Time, Place and Person) (with situation)  Thought Content:Scattered  History of Schizophrenia/Schizoaffective disorder:Yes  Duration of Psychotic Symptoms:Greater than six months  Hallucinations:No data recorded Ideas of Reference:Paranoia  Suicidal Thoughts:No data recorded Homicidal Thoughts:No data recorded  Sensorium  Memory: Immediate Poor; Remote Poor  Judgment: Poor  Insight: Fair   Chartered certified accountant: Fair  Attention Span: Fair  Recall: Fair  Fund of Knowledge: Good  Language: Good   Psychomotor Activity  Psychomotor Activity:No data recorded  Assets  Assets: Communication Skills; Financial Resources/Insurance   Sleep  Sleep:No data recorded   Physical Exam: Physical Exam Vitals and nursing note reviewed.  Constitutional:      Appearance: Normal appearance.  HENT:     Head: Normocephalic.     Nose: Nose normal.  Pulmonary:     Effort: Pulmonary effort is normal.  Musculoskeletal:        General: Normal range of motion.     Cervical back: Normal range of motion.  Neurological:     General: No focal deficit present.     Mental Status: She is alert and oriented to person, place, and time.    Review of Systems  Psychiatric/Behavioral:  The patient is nervous/anxious.   All other systems reviewed and are negative.  Blood pressure 136/77, pulse (!) 103, temperature 98.5 F (36.9 C), temperature source Oral, resp. rate 18, height 5\' 4"  (1.626 m), weight 104.3 kg, SpO2 98%. Body mass index is 39.48 kg/m.   Social History   Tobacco Use  Smoking Status Every Day   Current packs/day: 0.25   Average packs/day: 0.3 packs/day for 20.0 years (5.0 ttl pk-yrs)   Types: Cigarettes  Smokeless Tobacco Never  Tobacco Comments   States has cut back to 5-7 a day and working on this.    Tobacco Cessation:  {Discharge tobacco cessation prescription:304700209}   Blood Alcohol  level:  Lab Results  Component Value Date   ETH <10 01/01/2023   ETH <11 07/11/2012    Metabolic  Disorder Labs:  Lab Results  Component Value Date   HGBA1C 5.5 02/09/2023   MPG 111.15 02/09/2023   MPG 111.15 01/29/2023   No results found for: "PROLACTIN" Lab Results  Component Value Date   CHOL 213 (H) 02/09/2023   TRIG 121 02/09/2023   HDL 47 02/09/2023   CHOLHDL 4.5 02/09/2023   VLDL 24 02/09/2023   LDLCALC 142 (H) 02/09/2023   LDLCALC 160 (H) 05/14/2021    See Psychiatric Specialty Exam and Suicide Risk Assessment completed by Attending Physician prior to discharge.  Discharge destination:  {DISCHARGE DESTINATION:22616}  Is patient on multiple antipsychotic therapies at discharge:  {RECOMMEND TAPERING:22617}   Has Patient had three or more failed trials of antipsychotic monotherapy by history:  {BHH ANTIPSYCHOTIC:22903}  Recommended Plan for Multiple Antipsychotic Therapies: {B[facility] MULTIPLE ANTIPSYCHOTIC THERAPIES:22905}  Discharge Instructions     Diet - low sodium heart healthy   Complete by: As directed    Discharge instructions   Complete by: As directed    Follow up with outpatient resources   Increase activity slowly   Complete by: As directed       Allergies as of 02/10/2023       Reactions   Pork-derived Products Other (See Comments), Anaphylaxis   Religious Reasons (Muslim)   Lamictal [lamotrigine] Rash        Medication List     STOP taking these medications    gabapentin 300 MG capsule Commonly known as: NEURONTIN   hydrOXYzine 50 MG tablet Commonly known as: ATARAX       TAKE these medications      Indication  atorvastatin 20 MG tablet Commonly known as: LIPITOR TAKE ONE TABLET BY MOUTH ONCE DAILY FOR CHOLESTEROL What changed: See the new instructions.  Indication: High Amount of Fats in the Blood   Bariatric Multivitamins/Iron Caps Take 1 capsule by mouth daily.  Indication: vitmain deficiency   busPIRone 10 MG  tablet Commonly known as: BUSPAR Take 1 tablet (10 mg total) by mouth 2 (two) times daily. What changed:  medication strength how much to take  Indication: Anxiety Disorder   clonazePAM 0.5 MG tablet Commonly known as: KLONOPIN Take 1 tablet (0.5 mg total) by mouth 2 (two) times daily as needed for anxiety.  Indication: Feeling Anxious   cyanocobalamin 1000 MCG tablet Take 1 tablet (1,000 mcg total) by mouth daily.  Indication: Inadequate Vitamin B12   hydrochlorothiazide 12.5 MG tablet Commonly known as: HYDRODIURIL Take 0.5 tablets (6.25 mg total) by mouth daily.  Indication: High Blood Pressure   Oxcarbazepine 300 MG tablet Commonly known as: TRILEPTAL Take 1 tablet (300 mg total) by mouth 2 (two) times daily.  Indication: Manic Phase of Manic-Depression   pantoprazole 40 MG tablet Commonly known as: PROTONIX Take 1 tablet (40 mg total) by mouth 2 (two) times daily before a meal.  Indication: Gastroesophageal Reflux Disease   potassium chloride SA 20 MEQ tablet Commonly known as: KLOR-CON M Take 1 tablet (20 mEq total) by mouth daily.  Indication: Low Amount of Potassium in the Blood   pregabalin 75 MG capsule Commonly known as: LYRICA Take 1 capsule (75 mg total) by mouth 2 (two) times daily.  Indication: Neuropathic Pain   QUEtiapine 25 MG tablet Commonly known as: SEROQUEL Take 1 tablet (25 mg total) by mouth 2 (two) times daily. What changed: Another medication with the same name was changed. Make sure you understand how and when to take each.  Indication: Manic-Depression   QUEtiapine 400 MG  tablet Commonly known as: SEROQUEL Take 1 tablet (400 mg total) by mouth at bedtime. What changed:  medication strength how much to take  Indication: Manic-Depression   thiamine 100 MG tablet Commonly known as: VITAMIN B1 Take 1 tablet (100 mg total) by mouth daily.  Indication: Deficiency of Vitamin B1   topiramate 50 MG tablet Commonly known as:  TOPAMAX Take 1 tablet (50 mg total) by mouth 2 (two) times daily.  Indication: Body Weight Gain due to Antipsychotic Medication Use         Follow-up recommendations:  Activity:  as tolerated Diet:  heart healthy idet Bipolar affective disorder, depressed, moderate: Seroquel 25 mg BID and 400 mg at bedtime Trileptal 300 mg BID   General anxiety d/o: Buspar 10 mg BID   Insomnia: Hydroxyzine 50 mg at bedtime PRN  Comments:  Follow up with outpatient resources  Signed: Nanine Means, NP 02/10/2023, 12:14 PM

## 2023-02-10 NOTE — Progress Notes (Signed)
Patient ID: Shannon [name], female   DOB: 07/18/1965, 57 y.o.   MRN: [id number]  Discharge Note:  Patient denies SI/HI/AVH at this time. Discharge instructions, AVS, prescriptions, and transition record gone over with patient. Patient declined to fill out her Suicide Safety Plan. Patient agrees to comply with medication management, follow-up visit, and outpatient therapy. Patient belongings returned to patient. Patient questions and concerns addressed and answered. Patient ambulatory off unit. Patient discharged to hotel via [employer] services.

## 2023-02-10 NOTE — Progress Notes (Addendum)
  [facility] Adult Case Management Discharge Plan :  Will you be returning to the same living situation after discharge:  No.  Pt reports that she is going to a hotel. At discharge, do you have transportation home?: Yes,  CSW to assist with transportation needs. Do you have the ability to pay for your medications: Yes,  [employer] MEDICARE / [facility] MEDICARE  Release of information consent forms completed and in the chart;  Patient's signature needed at discharge.  Patient to Follow up at:  Follow-up Information     [facility] Follow up.   Why: Walk in hours are from 9AM to 1PM Monday-Friday Contact information: [address] [place] [address] [phone]                Next level of care provider has access to [facility] Link:no  Safety Planning and Suicide Prevention discussed: Yes,  SPE completed with the patient.  Attempts were made with the patient's brother but calls were not answered or returned.      Has patient been referred to the Quitline?: Patient refused referral for treatment  Patient has been referred for addiction treatment: No known substance use disorder.  Harden Mo, LCSW 02/10/2023, 1:29 PM
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
## 2023-02-10 NOTE — Plan of Care (Signed)
Pt denies SI/HI/AVH, compliant with procedures on the unit  Problem: Education: Goal: Knowledge of [facility] Education information/materials will improve Outcome: Progressing Goal: Emotional status will improve Outcome: Progressing Goal: Mental status will improve Outcome: Progressing Goal: Verbalization of understanding the information provided will improve Outcome: Progressing   Problem: Activity: Goal: Interest or engagement in activities will improve Outcome: Progressing Goal: Sleeping patterns will improve Outcome: Progressing   Problem: Coping: Goal: Ability to verbalize frustrations and anger appropriately will improve Outcome: Progressing Goal: Ability to demonstrate self-control will improve Outcome: Progressing   Problem: Health Behavior/Discharge Planning: Goal: Identification of resources available to assist in meeting health care needs will improve Outcome: Progressing Goal: Compliance with treatment plan for underlying cause of condition will improve Outcome: Progressing   Problem: Physical Regulation: Goal: Ability to maintain clinical measurements within normal limits will improve Outcome: Progressing   Problem: Safety: Goal: Periods of time without injury will increase Outcome: Progressing   Problem: Education: Goal: Ability to make informed decisions regarding treatment will improve Outcome: Progressing   Problem: Coping: Goal: Coping ability will improve Outcome: Progressing   Problem: Self-Concept: Goal: Ability to disclose and discuss suicidal ideas will improve Outcome: Progressing Goal: Will verbalize positive feelings about self Outcome: Progressing Note: Patient is on track. Patient will maintain adherence    Problem: Coping: Goal: Coping ability will improve Outcome: Progressing Goal: Will verbalize feelings Outcome: Progressing   Problem: Education: Goal: Will be free of psychotic symptoms Outcome: Progressing Goal:  Knowledge of the prescribed therapeutic regimen will improve Outcome: Progressing

## 2023-02-10 NOTE — Group Note (Signed)
Date:  02/10/2023 Time:  9:57 AM  Group Topic/Focus:  Goals Group:   The focus of this group is to help patients establish daily goals to achieve during treatment and discuss how the patient can incorporate goal setting into their daily lives to aide in recovery.    Participation Level:  Minimal  Participation Quality:  Resistant  Affect:  Irritable and Resistant  Cognitive:  Alert  Insight: None  Engagement in Group:  Defensive and Resistant  Modes of Intervention:  Discussion, Education, and Support  Additional Comments:    Wilford Corner 02/10/2023, 9:57 AM

## 2023-02-10 NOTE — Care Management Important Message (Signed)
Important Message  Patient Details  Name: Shannon [name] MRN: [id number] Date of Birth: Jun 23, 1965   Important Message Given:  Yes - Medicare IM     Harden Mo, LCSW 02/10/2023, 1:31 PM

## 2023-02-10 NOTE — Progress Notes (Incomplete)
[facility] MD Progress Note  02/10/2023 6:22 AM Shannon [name]  MRN:  [id number]  Subjective:  Notes, labs, vital signs reviewed, and progression team completed.  The client is currently houseless and refuses to go to a shelter, "that won't work for me."  Depression is "fine", denies suicidal ideations.  Anxiety is "always there, always stressed about something", mild to moderate level with no recent panic attacks. She reports she is stressed about her living situation and having to fill out an abundance of forms.  Her sleep was "fine", appetite is "ok".  She is working with the Child psychotherapist for discharge planning.  Denies side effects from her medications.  Principal Problem: Bipolar disorder, current episode depressed, severe, with psychotic features (HCC) Diagnosis: Principal Problem:   Bipolar disorder, current episode depressed, severe, with psychotic features (HCC)  Total Time spent with patient: 30 minutes  Past Psychiatric History: bipolar d/o  Past Medical History:  Past Medical History:  Diagnosis Date   Allergic rhinitis    Allergic rhinitis 01/25/2009        ANEMIA-IRON DEFICIENCY 01/25/2009   Anxiety    Backache 01/25/2009   Qualifier: Diagnosis of  By: Jonny Ruiz MD, Len Blalock    Bipolar 1 disorder [facility])    Chronic pain syndrome    Complete rotator cuff tear 07/21/2013   Complete tear of right rotator cuff 07/21/2013   Contact lens/glasses fitting    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   DISC DISEASE, LUMBAR 01/25/2009   Essential hypertension 01/25/2009   On Bystolic    GAD (generalized anxiety disorder) 12/28/2013   GERD (gastroesophageal reflux disease)    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   Headache, acute 12/20/2013   Hx of laparoscopic gastric banding 09/03/2010   Surgery date: 07/17/10    HYPERLIPIDEMIA 01/25/2009   Hyperlipidemia with target LDL less than 130 01/25/2009   HYPERTENSION 01/25/2009   Hypokalemia, inadequate intake 09/01/2011   Insomnia 04/04/2011    INSOMNIA-SLEEP DISORDER-UNSPEC 04/30/2009   Qualifier: Diagnosis of  By: Jonny Ruiz MD, Len Blalock    Iron deficiency anemia 01/25/2009        Labyrinthitis 02/19/2014   Leiomyoma of uterus, unspecified 08/13/2008   Overview:  Leiomyoma Of The Uterus  10/1 IMO update   Localized edema 06/12/2015   MANIC DEPRESSIVE ILLNESS 01/25/2009   pt is unsue if this is her specifc dx   Mixed bipolar I disorder (HCC) 07/12/2012   Morbid obesity (HCC) 01/25/2009   Night sweats    Osteopenia determined by x-ray 06/13/2021   Other abnormal glucose 10/26/2012   Palpitations 10/25/2013   PEPTIC ULCER DISEASE, HELICOBACTER PYLORI POSITIVE 10/03/2009   Peripheral edema 06/25/2015   Pernicious anemia 03/28/2012   Piriformis syndrome of both sides 09/02/2018   PUD (peptic ulcer disease) 10/03/2009        Right shoulder pain 05/03/2013   Dg Shoulder Right  05/03/2013   CLINICAL DATA Pain.  EXAM RIGHT SHOULDER - 2+ VIEW  COMPARISON None.  FINDINGS Acromioclavicular and glenohumeral degenerative change present. Questionable calcific density noted in the region of the supraspinatus space, possibly representing calcific supraspinatus tendinitis. This could represent a sclerotic density in the acromion. MRI of the right shoulder suggest   Sleep apnea 08/12/2016   SMOKER 01/25/2009   Qualifier: Diagnosis of  By: Jonny Ruiz MD, Len Blalock    Subacromial bursitis 05/17/2013   SUBSTANCE ABUSE 11/06/2009        Thiamin deficiency 10/30/2012   Tobacco abuse 06/09/2010   Visual disturbance 05/03/2013  Vitamin D deficiency 10/27/2012    Past Surgical History:  Procedure Laterality Date   ABDOMINAL HYSTERECTOMY     BLADDER SURGERY     s/p with ?diverticulitis   HAND TENDON SURGERY  1991   s/p-Right-index and middle   LAPAROSCOPIC GASTRIC BAND REMOVAL WITH LAPAROSCOPIC GASTRIC SLEEVE RESECTION N/A 08/17/2016   Procedure: LAPAROSCOPIC GASTRIC BAND REMOVAL WITH LAPAROSCOPIC GASTRIC SLEEVE RESECTION WITH UPPER ENDO;  Surgeon: Ovidio Kin, MD;  Location: [facility];   Service: General;  Laterality: N/A;   LAPAROSCOPIC GASTRIC BANDING  07/14/10   SHOULDER ARTHROSCOPY Right 07/21/2013   Procedure: RIGHT ARTHROSCOPY SHOULDER DEBRIDMENT EXTENTSIVE,ARTHROSCOPIC REMOVE LOOSE FOREIGN BODY, BICEPS TENOLYSIS ;  Surgeon: Eulas Post, MD;  Location: [facility];  Service: Orthopedics;  Laterality: Right;   TUBAL LIGATION     Family History:  Family History  Problem Relation Age of Onset   Hypertension Mother    Stroke Father    Cirrhosis Father        ETOH   Hypertension Father    Diabetes Father    Alcohol abuse Father    Hypertension Sister    Asthma Sister    Hypertension Brother    Hypertension Paternal Aunt    Diabetes Maternal Grandmother    ADD / ADHD Son    ADD / ADHD Other        3 nephews, also emotional issues   Diabetes Other        Uncle   Diabetes Other        Aunt   Suicidality Neg Hx    Family Psychiatric  History: see above Social History:  Social History   Substance and Sexual Activity  Alcohol Use No   Alcohol/week: 0.0 standard drinks of alcohol     Social History   Substance and Sexual Activity  Drug Use No    Social History   Socioeconomic History   Marital status: Divorced    Spouse name: Not on file   Number of children: 2   Years of education: Not on file   Highest education level: Professional school degree (e.g., MD, DDS, DVM, JD)  Occupational History   Occupation: STUDENT    Employer: UNEMPLOYED  Tobacco Use   Smoking status: Every Day    Current packs/day: 0.25    Average packs/day: 0.3 packs/day for 20.0 years (5.0 ttl pk-yrs)    Types: Cigarettes   Smokeless tobacco: Never   Tobacco comments:    States has cut back to 5-7 a day and working on this.   Vaping Use   Vaping status: Never Used  Substance and Sexual Activity   Alcohol use: No    Alcohol/week: 0.0 standard drinks of alcohol   Drug use: No   Sexual activity: Not Currently  Other Topics Concern   Not on file   Social History Narrative   Moved from [place], then [place] to [place] 2009   2 children-1 boy, 1 girl   Disabled-bipolar   Daily Caffeine Use-1 cup/day      Right Handed   Lives in 2nd floor condo   Social Drivers of Health   Financial Resource Strain: High Risk (01/01/2023)   Overall Financial Resource Strain (CARDIA)    Difficulty of Paying Living Expenses: Very hard  Food Insecurity: No Food Insecurity (02/04/2023)   Hunger Vital Sign    Worried About Running Out of Food in the Last Year: Never true    Ran Out of Food in the Last  Year: Never true  Recent Concern: Food Insecurity - Food Insecurity Present (01/28/2023)   Hunger Vital Sign    Worried About Running Out of Food in the Last Year: Sometimes true    Ran Out of Food in the Last Year: Sometimes true  Transportation Needs: No Transportation Needs (02/04/2023)   PRAPARE - Administrator, Civil Service (Medical): No    Lack of Transportation (Non-Medical): No  Physical Activity: Inactive (01/01/2023)   Exercise Vital Sign    Days of Exercise per Week: 0 days    Minutes of Exercise per Session: 0 min  Stress: Stress Concern Present (01/01/2023)   [employer] of Occupational Health - Occupational Stress Questionnaire    Feeling of Stress : Very much  Social Connections: Socially Isolated (01/01/2023)   Social Connection and Isolation Panel [NHANES]    Frequency of Communication with Friends and Family: Never    Frequency of Social Gatherings with Friends and Family: Never    Attends Religious Services: 1 to 4 times per year    Active Member of [employer] or Organizations: No    Attends Engineer, structural: Not on file    Marital Status: Separated   Additional Social History:  houseless    Sleep: Good  Appetite:  Fair  Current Medications: Current Facility-Administered Medications  Medication Dose Route Frequency Provider Last Rate Last Admin   acetaminophen (TYLENOL) tablet 650 mg  650 mg  Oral Q6H PRN Starkes-Perry, Juel Burrow, FNP       alum & mag hydroxide-simeth (MAALOX/MYLANTA) 200-200-20 MG/5ML suspension 30 mL  30 mL Oral Q4H PRN Starkes-Perry, Juel Burrow, FNP       busPIRone (BUSPAR) tablet 10 mg  10 mg Oral BID Myriam Forehand, NP   10 mg at 02/09/23 1738   cyanocobalamin (VITAMIN B12) tablet 1,000 mcg  1,000 mcg Oral Daily Maryagnes Amos, FNP   1,000 mcg at 02/09/23 0947   haloperidol (HALDOL) tablet 5 mg  5 mg Oral TID PRN Maryagnes Amos, FNP   5 mg at 02/05/23 1252   And   diphenhydrAMINE (BENADRYL) capsule 50 mg  50 mg Oral TID PRN Maryagnes Amos, FNP   50 mg at 02/05/23 1252   gabapentin (NEURONTIN) capsule 600 mg  600 mg Oral TID Myriam Forehand, NP   600 mg at 02/09/23 1738   hydrochlorothiazide (HYDRODIURIL) tablet 6.25 mg  6.25 mg Oral Daily Maryagnes Amos, FNP   6.25 mg at 02/09/23 0946   hydrOXYzine (ATARAX) tablet 50 mg  50 mg Oral QHS Maryagnes Amos, FNP   50 mg at 02/09/23 2110   magnesium hydroxide (MILK OF MAGNESIA) suspension 30 mL  30 mL Oral Daily PRN Maryagnes Amos, FNP   30 mL at 02/04/23 1825   multivitamin with minerals tablet 1 tablet  1 tablet Oral BID WC Maryagnes Amos, FNP   1 tablet at 02/09/23 2109   nicotine polacrilex (NICORETTE) gum 2 mg  2 mg Oral PRN Maryagnes Amos, FNP       Oxcarbazepine (TRILEPTAL) tablet 300 mg  300 mg Oral BID Maryagnes Amos, FNP   300 mg at 02/09/23 1738   pantoprazole (PROTONIX) EC tablet 40 mg  40 mg Oral BID AC Maryagnes Amos, FNP   40 mg at 02/09/23 1738   polyethylene glycol (MIRALAX / GLYCOLAX) packet 17 g  17 g Oral Daily Maryagnes Amos, FNP   17 g at 02/09/23 [phone]  potassium chloride SA (KLOR-CON M) CR tablet 20 mEq  20 mEq Oral Daily Lolita Patella B, MD   20 mEq at 02/09/23 0947   pregabalin (LYRICA) capsule 75 mg  75 mg Oral BID Lolita Patella B, MD   75 mg at 02/09/23 1738   protein supplement (ENSURE MAX) liquid  11 oz  Oral BID Lewanda Rife, MD       QUEtiapine (SEROQUEL) tablet 25 mg  25 mg Oral BID Maryagnes Amos, FNP   25 mg at 02/09/23 1738   QUEtiapine (SEROQUEL) tablet 400 mg  400 mg Oral QHS Myriam Forehand, NP   400 mg at 02/09/23 2109   simethicone (MYLICON) 40 MG/0.6ML suspension 40 mg  40 mg Oral QID PRN Maryagnes Amos, FNP       topiramate (TOPAMAX) tablet 50 mg  50 mg Oral BID Myriam Forehand, NP   50 mg at 02/09/23 1738   traMADol (ULTRAM) tablet 100 mg  100 mg Oral Q6H PRN Myriam Forehand, NP   100 mg at 02/10/23 8469    Lab Results:  Results for orders placed or performed during the hospital encounter of 02/04/23 (from the past 48 hours)  Lipid panel     Status: Abnormal   Collection Time: 02/09/23  6:10 AM  Result Value Ref Range   Cholesterol 213 (H) 0 - 200 mg/dL   Triglycerides 629 <528 mg/dL   HDL 47 >41 mg/dL   Total CHOL/HDL Ratio 4.5 RATIO   VLDL 24 0 - 40 mg/dL   LDL Cholesterol 324 (H) 0 - 99 mg/dL    Comment:        Total Cholesterol/HDL:CHD Risk Coronary Heart Disease Risk Table                     Men   Women  1/2 Average Risk   3.4   3.3  Average Risk       5.0   4.4  2 X Average Risk   9.6   7.1  3 X Average Risk  23.4   11.0        Use the calculated Patient Ratio above and the CHD Risk Table to determine the patient's CHD Risk.        ATP III CLASSIFICATION (LDL):  <100     mg/dL   Optimal  401-027  mg/dL   Near or Above                    Optimal  130-159  mg/dL   Borderline  253-664  mg/dL   High  >403     mg/dL   Very High Performed at [facility], [address] [place], [place] [address]   Hemoglobin A1c     Status: None   Collection Time: 02/09/23  6:10 AM  Result Value Ref Range   Hgb A1c MFr Bld 5.5 4.8 - 5.6 %    Comment: (NOTE) Pre diabetes:          5.7%-6.4%  Diabetes:              >6.4%  Glycemic control for   <7.0% adults with diabetes    Mean Plasma Glucose 111.15 mg/dL    Comment: Performed at [facility], [address], [place], [place] [address]   *Note: Due to a large number of results and/or encounters for the requested time period, some results have not been displayed. A  complete set of results can be found in Results Review.    Blood Alcohol level:  Lab Results  Component Value Date   [facility] <10 01/01/2023   ETH <11 07/11/2012    Metabolic Disorder Labs: Lab Results  Component Value Date   HGBA1C 5.5 02/09/2023   MPG 111.15 02/09/2023   MPG 111.15 01/29/2023   No results found for: "PROLACTIN" Lab Results  Component Value Date   CHOL 213 (H) 02/09/2023   TRIG 121 02/09/2023   HDL 47 02/09/2023   CHOLHDL 4.5 02/09/2023   VLDL 24 02/09/2023   LDLCALC 142 (H) 02/09/2023   LDLCALC 160 (H) 05/14/2021    Physical Findings: AIMS:  , ,  ,  ,    CIWA:    COWS:     Musculoskeletal: Strength & Muscle Tone: within normal limits Gait & Station: normal Patient leans: N/A Psychiatric Specialty Exam: Physical Exam Vitals and nursing note reviewed.  Constitutional:      Appearance: Normal appearance.  HENT:     Head: Normocephalic.     Nose: Nose normal.  Pulmonary:     Effort: Pulmonary effort is normal.  Musculoskeletal:        General: Normal range of motion.     Cervical back: Normal range of motion.  Neurological:     General: No focal deficit present.     Mental Status: She is alert and oriented to person, place, and time.     Review of Systems  Psychiatric/Behavioral:  Positive for depression. The patient is nervous/anxious.   All other systems reviewed and are negative.   Blood pressure (!) 141/64, pulse 95, temperature (!) 97.2 F (36.2 C), resp. rate 20, height 5\' 4"  (1.626 m), weight 104.3 kg, SpO2 100%.Body mass index is 39.48 kg/m.  General Appearance: Casual  Eye Contact:  Good  Speech:  Normal Rate  Volume:  Normal  Mood:  Anxious and Depressed  Affect:  Congruent  Thought Process:  Coherent  Orientation:  Full (Time, Place, and  Person)  Thought Content:  Logical  Suicidal Thoughts:  No  Homicidal Thoughts:  No  Memory:  Immediate;   Fair Recent;   Fair Remote;   Fair  Judgement:  Fair  Insight:  Fair  Psychomotor Activity:  Normal  Concentration:  Concentration: Good and Attention Span: Good  Recall:  Good  Fund of Knowledge:  Good  Language:  Good  Akathisia:  No  Handed:  Right  AIMS (if indicated):     Assets:  Leisure Time Resilience Social Support  ADL's:  Intact  Cognition:  WNL  Sleep:        Physical Exam: Physical Exam Vitals and nursing note reviewed.  Constitutional:      Appearance: Normal appearance.  HENT:     Head: Normocephalic.     Nose: Nose normal.  Pulmonary:     Effort: Pulmonary effort is normal.  Musculoskeletal:        General: Normal range of motion.     Cervical back: Normal range of motion.  Neurological:     General: No focal deficit present.     Mental Status: She is alert and oriented to person, place, and time.    Review of Systems  Psychiatric/Behavioral:  Positive for depression. The patient is nervous/anxious.   All other systems reviewed and are negative.  Blood pressure (!) 141/64, pulse 95, temperature (!) 97.2 F (36.2 C), resp. rate 20, height 5\' 4"  (1.626 m), weight 104.3 kg, SpO2  100%. Body mass index is 39.48 kg/m.   Treatment Plan Summary: Daily contact with patient to assess and evaluate symptoms and progress in treatment, Medication management, and Plan : Bipolar affective disorder, depressed, moderate: Seroquel 25 mg BID and 400 mg at bedtime Trileptal 300 mg BID  General anxiety d/o: Buspar 10 mg BID  Insomnia: Hydroxyzine 50 mg at bedtime PRN  Nanine Means, NP 02/10/2023, 6:22 AM

## 2023-02-10 NOTE — [facility] Counselor (Signed)
CSW touched base with Tammy, director of [facility] facility on patient's behalf.   Tammy would like to do an in person assessment with patient today 02/10/23 or tomorrow 02/11/23. Tammy to let team know exact date. CSW team to assess.   Patient has expressed that she is ready to discharge to team. Patient wants to be discharged to a hotel in [place]. Team to assess. CSW will touch base with Tammy based on patient's decision.   Reymundo Poll, MSW, LCSWA 02/10/2023 12:07 PM

## 2023-02-10 NOTE — Progress Notes (Signed)
   02/10/23 0841  Psych Admission Type (Psych Patients Only)  Admission Status Voluntary  Psychosocial Assessment  Patient Complaints Anxiety;Depression  Eye Contact Fair  Facial Expression Anxious (Concerned w/support of discharge planning)  Affect Appropriate to circumstance  Speech Logical/coherent  Interaction Assertive  Motor Activity Slow  Appearance/Hygiene Unremarkable  Behavior Characteristics Cooperative  Mood Pleasant  Aggressive Behavior  Effect No apparent injury  Thought Process  Coherency WDL  Content Blaming others  Delusions None reported or observed  Perception Hallucinations  Hallucination Auditory  Judgment WDL  Confusion None  Danger to Self  Current suicidal ideation? Denies  Self-Injurious Behavior No self-injurious ideation or behavior indicators observed or expressed   Agreement Not to Harm Self Yes  Description of Agreement Verbal  Danger to Others  Danger to Others None reported or observed

## 2023-02-10 NOTE — [facility] Counselor (Signed)
Patient completed application for women's shelter. CSW sent on patient's behalf completed application to [facility].   CSW touched base with Drexel Iha, DHA, RN of [facility] at [phone] on patient's behalf. Marlis Edelson to touched base with patient to follow up.   According to Ms. Dalbert Garnet, there could be bed availability as early as next week.   Reymundo Poll, MSW, LCSWA 02/10/2023 12:04 PM

## 2023-02-10 NOTE — Plan of Care (Signed)
  Problem: Education: Goal: Knowledge of [facility] Education information/materials will improve Outcome: Adequate for Discharge Goal: Emotional status will improve Outcome: Adequate for Discharge Goal: Mental status will improve Outcome: Adequate for Discharge Goal: Verbalization of understanding the information provided will improve Outcome: Adequate for Discharge   Problem: Activity: Goal: Interest or engagement in activities will improve Outcome: Adequate for Discharge Goal: Sleeping patterns will improve Outcome: Adequate for Discharge   Problem: Coping: Goal: Ability to verbalize frustrations and anger appropriately will improve Outcome: Adequate for Discharge Goal: Ability to demonstrate self-control will improve Outcome: Adequate for Discharge   Problem: Health Behavior/Discharge Planning: Goal: Identification of resources available to assist in meeting health care needs will improve Outcome: Adequate for Discharge Goal: Compliance with treatment plan for underlying cause of condition will improve Outcome: Adequate for Discharge   Problem: Physical Regulation: Goal: Ability to maintain clinical measurements within normal limits will improve Outcome: Adequate for Discharge   Problem: Safety: Goal: Periods of time without injury will increase Outcome: Adequate for Discharge   Problem: Education: Goal: Ability to make informed decisions regarding treatment will improve Outcome: Adequate for Discharge   Problem: Coping: Goal: Coping ability will improve Outcome: Adequate for Discharge   Problem: Self-Concept: Goal: Ability to disclose and discuss suicidal ideas will improve Outcome: Adequate for Discharge Goal: Will verbalize positive feelings about self Outcome: Adequate for Discharge   Problem: Coping: Goal: Coping ability will improve Outcome: Adequate for Discharge Goal: Will verbalize feelings Outcome: Adequate for Discharge   Problem:  Education: Goal: Will be free of psychotic symptoms Outcome: Adequate for Discharge Goal: Knowledge of the prescribed therapeutic regimen will improve Outcome: Adequate for Discharge

## 2023-02-10 NOTE — Group Note (Signed)
[facility] LCSW Group Therapy Note   Group Date: 02/10/2023 Start Time: 1300 End Time: 1350   Type of Therapy/Topic:  Group Therapy:  Emotion Regulation  Participation Level:  Did Not Attend   Description of Group:    The purpose of this group is to assist patients in learning to regulate negative emotions and experience positive emotions. Patients will be guided to discuss ways in which they have been vulnerable to their negative emotions. These vulnerabilities will be juxtaposed with experiences of positive emotions or situations, and patients challenged to use positive emotions to combat negative ones. Special emphasis will be placed on coping with negative emotions in conflict situations, and patients will process healthy conflict resolution skills.  Therapeutic Goals: Patient will identify two positive emotions or experiences to reflect on in order to balance out negative emotions:  Patient will label two or more emotions that they find the most difficult to experience:  Patient will be able to demonstrate positive conflict resolution skills through discussion or role plays:   Summary of Patient Progress: X   Therapeutic Modalities:   Cognitive Behavioral Therapy Feelings Identification Dialectical Behavioral Therapy   Glenis Smoker, LCSW

## 2023-02-11 ENCOUNTER — Telehealth: Payer: Self-pay

## 2023-02-11 ENCOUNTER — Telehealth (HOSPITAL_BASED_OUTPATIENT_CLINIC_OR_DEPARTMENT_OTHER): Payer: Self-pay | Admitting: Psychiatry

## 2023-02-11 ENCOUNTER — Encounter (HOSPITAL_COMMUNITY): Payer: Self-pay

## 2023-02-11 DIAGNOSIS — Z91199 Patient's noncompliance with other medical treatment and regimen due to unspecified reason: Secondary | ICD-10-CM

## 2023-02-11 NOTE — Progress Notes (Signed)
Patient is in no show today.  Tried to call her at [phone] but number is disconnected.  We will send a letter.

## 2023-02-11 NOTE — Transitions of Care (Post Inpatient/ED Visit) (Signed)
   02/11/2023  Name: Shannon [name] MRN: [id number] DOB: 08/13/1965  Today's TOC FU Call Status: Today's TOC FU Call Status:: Unsuccessful Call (1st Attempt) Unsuccessful Call (1st Attempt) Date: 02/11/23  Attempted to reach the patient regarding the most recent Inpatient/ED visit.  Follow Up Plan: Additional outreach attempts will be made to reach the patient to complete the Transitions of Care (Post Inpatient/ED visit) call.   Signature Karena Addison, LPN [facility] Nurse Health Advisor Direct Dial [phone]

## 2023-02-12 ENCOUNTER — Other Ambulatory Visit: Payer: Self-pay

## 2023-02-12 NOTE — Transitions of Care (Post Inpatient/ED Visit) (Unsigned)
   02/12/2023  Name: Shannon [name] MRN: [id number] DOB: [birth year]  Today's TOC FU Call Status: Today's TOC FU Call Status:: Unsuccessful Call (2nd Attempt) Unsuccessful Call (1st Attempt) Date: 02/11/23 Unsuccessful Call (2nd Attempt) Date: 02/12/23  Attempted to reach the patient regarding the most recent Inpatient/ED visit.  Follow Up Plan: Additional outreach attempts will be made to reach the patient to complete the Transitions of Care (Post Inpatient/ED visit) call.   Signature Karena Addison, LPN [facility] Nurse Health Advisor Direct Dial [phone]

## 2023-02-15 ENCOUNTER — Other Ambulatory Visit: Payer: Self-pay

## 2023-02-15 NOTE — Patient Outreach (Unsigned)
  Care Management   Visit Note  02/15/2023 Name: KAIRA HAMITER MRN: [id number] DOB: October 02, 1965  Subjective: KIMBERLLY HUBANKS is a 57 y.o. year old female who is a primary care patient of Suezanne Jacquet, [name], NP-C. The Care Management team was consulted for assistance.      Engaged with Ms. Metzer via telephone.  Assessment:   Interventions: {CCM RNCM INTERVENTIONS:22296}  Recommendations:     Consent to Services:  {CAREMANAGEMENTCONSENTOPTIONS:24923}  Plan: {CM FOLLOW UP PLAN:22241}  SIG

## 2023-02-15 NOTE — Transitions of Care (Post Inpatient/ED Visit) (Signed)
   02/15/2023  Name: SHAUNELLE BRYS MRN: [id number] DOB: [birth year]  Today's TOC FU Call Status: Today's TOC FU Call Status:: Unsuccessful Call (3rd Attempt) Unsuccessful Call (1st Attempt) Date: 02/11/23 Unsuccessful Call (2nd Attempt) Date: 02/12/23 Unsuccessful Call (3rd Attempt) Date: 02/15/23  Attempted to reach the patient regarding the most recent Inpatient/ED visit.  Follow Up Plan: No further outreach attempts will be made at this time. We have been unable to contact the patient.  Signature Karena Addison, LPN [facility] Nurse Health Advisor Direct Dial [phone]

## 2023-02-16 ENCOUNTER — Other Ambulatory Visit: Payer: Self-pay

## 2023-02-16 NOTE — Patient Outreach (Signed)
  Care Management   Visit Note  02/16/2023 Name: Shannon [name] MRN: [id number] DOB: [birth year]  Subjective: Shannon [name] is a 57 y.o. year old female who is a primary care patient of Suezanne Jacquet, [name], NP-C. The Care Management team was consulted for assistance.      Engaged with Shannon [name] via telephone.  Assessment:  Review of patient past medical history, allergies, medications, health status, including review of consultants reports, laboratory and other test data, was performed as part of  evaluation and provision of care management services.    Outpatient Encounter Medications as of 02/16/2023  Medication Sig   atorvastatin (LIPITOR) 20 MG tablet TAKE ONE TABLET BY MOUTH ONCE DAILY FOR CHOLESTEROL (Patient taking differently: Take 20 mg by mouth daily.)   cyanocobalamin 1000 MCG tablet Take 1 tablet (1,000 mcg total) by mouth daily.   hydrochlorothiazide (HYDRODIURIL) 12.5 MG tablet Take 0.5 tablets (6.25 mg total) by mouth daily.   Multiple Vitamins-Minerals (BARIATRIC MULTIVITAMINS/IRON) CAPS Take 1 capsule by mouth daily.   pantoprazole (PROTONIX) 40 MG tablet Take 1 tablet (40 mg total) by mouth 2 (two) times daily before a meal.   pregabalin (LYRICA) 75 MG capsule Take 1 capsule (75 mg total) by mouth 2 (two) times daily.   QUEtiapine (SEROQUEL) 25 MG tablet Take 1 tablet (25 mg total) by mouth 2 (two) times daily.   thiamine (VITAMIN B1) 100 MG tablet Take 1 tablet (100 mg total) by mouth daily.   [DISCONTINUED] busPIRone (BUSPAR) 10 MG tablet Take 1 tablet (10 mg total) by mouth 2 (two) times daily.   [DISCONTINUED] clonazePAM (KLONOPIN) 0.5 MG tablet Take 1 tablet (0.5 mg total) by mouth 2 (two) times daily as needed for anxiety.   [DISCONTINUED] Oxcarbazepine (TRILEPTAL) 300 MG tablet Take 1 tablet (300 mg total) by mouth 2 (two) times daily.   [DISCONTINUED] potassium chloride SA (KLOR-CON M) 20 MEQ tablet Take 1 tablet (20 mEq total) by mouth daily.   [DISCONTINUED]  QUEtiapine (SEROQUEL) 400 MG tablet Take 1 tablet (400 mg total) by mouth at bedtime.   [DISCONTINUED] topiramate (TOPAMAX) 50 MG tablet Take 1 tablet (50 mg total) by mouth 2 (two) times daily.   No facility-administered encounter medications on file as of 02/16/2023.

## 2023-02-18 ENCOUNTER — Other Ambulatory Visit: Payer: Self-pay

## 2023-02-18 NOTE — Patient Outreach (Signed)
  Care Management   Visit Note  02/18/2023 Name: Shannon [name] MRN: [id number] DOB: [birth year]  Subjective: Shannon [name] is a 57 y.o. year old female who is a primary care patient of Suezanne Jacquet, [name], NP-C. The Care Management team was consulted for assistance.      Engaged with [name] via telephone.  Assessment:  Review of patient past medical history, allergies, medications, health status, including review of consultants reports, laboratory and other test data, was performed as part of  evaluation and provision of care management services.    Outpatient Encounter Medications as of 02/18/2023  Medication Sig   atorvastatin (LIPITOR) 20 MG tablet TAKE ONE TABLET BY MOUTH ONCE DAILY FOR CHOLESTEROL (Patient taking differently: Take 20 mg by mouth daily.)   busPIRone (BUSPAR) 10 MG tablet Take 1 tablet (10 mg total) by mouth 2 (two) times daily.   clonazePAM (KLONOPIN) 0.5 MG tablet Take 1 tablet (0.5 mg total) by mouth 2 (two) times daily as needed for anxiety.   cyanocobalamin 1000 MCG tablet Take 1 tablet (1,000 mcg total) by mouth daily.   hydrochlorothiazide (HYDRODIURIL) 12.5 MG tablet Take 0.5 tablets (6.25 mg total) by mouth daily.   Multiple Vitamins-Minerals (BARIATRIC MULTIVITAMINS/IRON) CAPS Take 1 capsule by mouth daily.   Oxcarbazepine (TRILEPTAL) 300 MG tablet Take 1 tablet (300 mg total) by mouth 2 (two) times daily.   pantoprazole (PROTONIX) 40 MG tablet Take 1 tablet (40 mg total) by mouth 2 (two) times daily before a meal.   potassium chloride SA (KLOR-CON M) 20 MEQ tablet Take 1 tablet (20 mEq total) by mouth daily.   pregabalin (LYRICA) 75 MG capsule Take 1 capsule (75 mg total) by mouth 2 (two) times daily.   QUEtiapine (SEROQUEL) 25 MG tablet Take 1 tablet (25 mg total) by mouth 2 (two) times daily.   QUEtiapine (SEROQUEL) 400 MG tablet Take 1 tablet (400 mg total) by mouth at bedtime.   thiamine (VITAMIN B1) 100 MG tablet Take 1 tablet (100 mg total) by  mouth daily.   topiramate (TOPAMAX) 50 MG tablet Take 1 tablet (50 mg total) by mouth 2 (two) times daily.   No facility-administered encounter medications on file as of 02/18/2023.     PLAN Will follow up within the next week.   [facility]  [facility], [facility] RN Care Manager Direct Dial: [phone] Website: [email or web address]

## 2023-02-26 ENCOUNTER — Telehealth: Payer: Self-pay

## 2023-02-26 NOTE — Patient Outreach (Signed)
  Care Management   Outreach Note  02/26/2023 Name: Shannon [name] MRN: [id number] DOB: Jan 13, 1966  An unsuccessful telephone outreach was attempted today to contact the patient about Care Management needs.     Follow Up Plan:  A HIPAA compliant phone message was left for the patient providing contact information and requesting a return call.    [facility]  [facility], [facility] RN Care Manager Direct Dial: [phone] Website: [email or web address]

## 2023-03-01 ENCOUNTER — Encounter (HOSPITAL_COMMUNITY): Payer: Medicare Other | Admitting: Psychiatry

## 2023-03-01 NOTE — Progress Notes (Signed)
 Patient was connected to video platform but I could not see or hear her.  She is having some technical difficulty to connect with a provider.  I send her a message that recent hospitalization she will require in person visit.

## 2023-03-02 ENCOUNTER — Encounter (HOSPITAL_COMMUNITY): Payer: Self-pay | Admitting: Psychiatry

## 2023-03-02 ENCOUNTER — Ambulatory Visit (HOSPITAL_BASED_OUTPATIENT_CLINIC_OR_DEPARTMENT_OTHER): Payer: Medicare Other | Admitting: Psychiatry

## 2023-03-02 VITALS — BP 148/80 | Ht 64.0 in | Wt 200.0 lb

## 2023-03-02 DIAGNOSIS — F4310 Post-traumatic stress disorder, unspecified: Secondary | ICD-10-CM | POA: Diagnosis not present

## 2023-03-02 DIAGNOSIS — F411 Generalized anxiety disorder: Secondary | ICD-10-CM

## 2023-03-02 DIAGNOSIS — F319 Bipolar disorder, unspecified: Secondary | ICD-10-CM

## 2023-03-02 MED ORDER — QUETIAPINE FUMARATE 400 MG PO TABS: 400.0000 mg | ORAL_TABLET | Freq: Every day | ORAL | 0 refills | Status: DC

## 2023-03-02 MED ORDER — FLUOXETINE HCL 20 MG PO CAPS: 20.0000 mg | ORAL_CAPSULE | Freq: Every day | ORAL | 0 refills | Status: DC

## 2023-03-02 MED ORDER — CLONAZEPAM 0.5 MG PO TABS: 0.5000 mg | ORAL_TABLET | Freq: Two times a day (BID) | ORAL | 0 refills | Status: DC | PRN
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
## 2023-03-05 ENCOUNTER — Ambulatory Visit: Payer: Medicare Other | Admitting: Family Medicine

## 2023-03-10 ENCOUNTER — Encounter: Payer: Self-pay | Admitting: Family Medicine

## 2023-03-10 ENCOUNTER — Other Ambulatory Visit: Payer: Self-pay | Admitting: Family Medicine

## 2023-03-10 ENCOUNTER — Ambulatory Visit: Payer: Medicare Other | Admitting: Family Medicine

## 2023-03-10 VITALS — BP 132/74 | HR 105 | Temp 97.6°F | Ht 64.0 in | Wt 201.0 lb

## 2023-03-10 DIAGNOSIS — E519 Thiamine deficiency, unspecified: Secondary | ICD-10-CM | POA: Diagnosis not present

## 2023-03-10 DIAGNOSIS — E785 Hyperlipidemia, unspecified: Secondary | ICD-10-CM | POA: Diagnosis not present

## 2023-03-10 DIAGNOSIS — R29898 Other symptoms and signs involving the musculoskeletal system: Secondary | ICD-10-CM

## 2023-03-10 DIAGNOSIS — I10 Essential (primary) hypertension: Secondary | ICD-10-CM | POA: Diagnosis not present

## 2023-03-10 DIAGNOSIS — R634 Abnormal weight loss: Secondary | ICD-10-CM

## 2023-03-10 DIAGNOSIS — Z91199 Patient's noncompliance with other medical treatment and regimen due to unspecified reason: Secondary | ICD-10-CM | POA: Insufficient documentation

## 2023-03-10 DIAGNOSIS — E876 Hypokalemia: Secondary | ICD-10-CM

## 2023-03-10 DIAGNOSIS — E639 Nutritional deficiency, unspecified: Secondary | ICD-10-CM | POA: Diagnosis not present

## 2023-03-10 DIAGNOSIS — E538 Deficiency of other specified B group vitamins: Secondary | ICD-10-CM

## 2023-03-10 DIAGNOSIS — Z599 Problem related to housing and economic circumstances, unspecified: Secondary | ICD-10-CM | POA: Insufficient documentation

## 2023-03-10 DIAGNOSIS — F3160 Bipolar disorder, current episode mixed, unspecified: Secondary | ICD-10-CM

## 2023-03-10 DIAGNOSIS — R202 Paresthesia of skin: Secondary | ICD-10-CM

## 2023-03-10 DIAGNOSIS — Z9181 History of falling: Secondary | ICD-10-CM

## 2023-03-10 DIAGNOSIS — R200 Anesthesia of skin: Secondary | ICD-10-CM | POA: Diagnosis not present

## 2023-03-10 LAB — COMPREHENSIVE METABOLIC PANEL
ALT: 10 U/L (ref 0–35)
AST: 13 U/L (ref 0–37)
Albumin: 3.8 g/dL (ref 3.5–5.2)
Alkaline Phosphatase: 97 U/L (ref 39–117)
BUN: 7 mg/dL (ref 6–23)
CO2: 30 meq/L (ref 19–32)
Calcium: 9.1 mg/dL (ref 8.4–10.5)
Chloride: 102 meq/L (ref 96–112)
Creatinine, Ser: 0.72 mg/dL (ref 0.40–1.20)
GFR: 91.98 mL/min (ref >60.00)
Glucose, Bld: 98 mg/dL (ref 70–99)
Potassium: 3.1 meq/L — ABNORMAL LOW (ref 3.5–5.1)
Sodium: 140 meq/L (ref 135–145)
Total Bilirubin: 0.3 mg/dL (ref 0.2–1.2)
Total Protein: 8.2 g/dL (ref 6.0–8.3)

## 2023-03-10 LAB — FOLATE: Folate: 17 ng/mL (ref >5.9)

## 2023-03-10 LAB — FERRITIN: Ferritin: 13.3 ng/mL (ref 10.0–291.0)

## 2023-03-10 LAB — T4, FREE: Free T4: 0.68 ng/dL (ref 0.60–1.60)

## 2023-03-10 LAB — TSH: TSH: 1.8 u[IU]/mL (ref 0.35–5.50)

## 2023-03-10 LAB — VITAMIN B12: Vitamin B-12: 797 pg/mL (ref 211–911)

## 2023-03-10 MED ORDER — POTASSIUM CHLORIDE CRYS ER 20 MEQ PO TBCR: 20.0000 meq | EXTENDED_RELEASE_TABLET | Freq: Every day | ORAL | 0 refills | Status: DC
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
## 2023-03-10 NOTE — Patient Instructions (Addendum)
 Please go downstairs for labs before you leave.   I have referred you to [facility] Neurology for your right leg and foot numbness and tingling along with weakness.   You have an appointment tomorrow with [facility] Sports Medicine.

## 2023-03-10 NOTE — Progress Notes (Signed)
 Her labs are fine so far except her potassium is low. I will send in potassium for her to take to improve this.

## 2023-03-11 ENCOUNTER — Ambulatory Visit (INDEPENDENT_AMBULATORY_CARE_PROVIDER_SITE_OTHER): Payer: Medicare Other | Admitting: Sports Medicine

## 2023-03-11 ENCOUNTER — Other Ambulatory Visit: Payer: Self-pay

## 2023-03-11 VITALS — BP 132/74 | HR 110 | Ht 64.0 in | Wt 201.0 lb

## 2023-03-11 DIAGNOSIS — R296 Repeated falls: Secondary | ICD-10-CM

## 2023-03-11 DIAGNOSIS — R270 Ataxia, unspecified: Secondary | ICD-10-CM

## 2023-03-11 DIAGNOSIS — R200 Anesthesia of skin: Secondary | ICD-10-CM | POA: Diagnosis not present

## 2023-03-11 DIAGNOSIS — E876 Hypokalemia: Secondary | ICD-10-CM

## 2023-03-11 DIAGNOSIS — R202 Paresthesia of skin: Secondary | ICD-10-CM | POA: Diagnosis not present

## 2023-03-11 DIAGNOSIS — E519 Thiamine deficiency, unspecified: Secondary | ICD-10-CM

## 2023-03-11 LAB — FERRITIN: Ferritin: 13.1 ng/mL (ref 10.0–291.0)

## 2023-03-11 LAB — C-REACTIVE PROTEIN: CRP: 1.5 mg/dL (ref 0.5–20.0)

## 2023-03-11 LAB — SEDIMENTATION RATE: Sed Rate: 117 mm/h — ABNORMAL HIGH (ref 0–30)

## 2023-03-11 LAB — URIC ACID: Uric Acid, Serum: 3.9 mg/dL (ref 2.4–7.0)

## 2023-03-11 MED ORDER — GABAPENTIN 400 MG PO CAPS: 800.0000 mg | ORAL_CAPSULE | Freq: Two times a day (BID) | ORAL | 0 refills | Status: DC

## 2023-03-11 MED ORDER — GABAPENTIN 400 MG PO CAPS: 800.0000 mg | ORAL_CAPSULE | Freq: Three times a day (TID) | ORAL | 1 refills | Status: DC

## 2023-03-11 MED ORDER — AMBULATORY NON FORMULARY MEDICATION: 0 refills | Status: AC

## 2023-03-11 NOTE — Telephone Encounter (Signed)
Please advise- per hospital documentation she was supposed to discontinue this

## 2023-03-11 NOTE — Patient Instructions (Signed)
Discontinue lyrica Start gabapentin 800 mg 3x a day  Brain MRI with [place]  DME cane Labs on the way out Follow up 5 days after brain MRI

## 2023-03-11 NOTE — Addendum Note (Signed)
Addended by: Richardean Sale on: 03/11/2023 01:55 PM   Modules accepted: Orders

## 2023-03-11 NOTE — Progress Notes (Addendum)
Shannon [name] D.[name] [address] [place] [address] Phone: [phone]   Assessment and Plan:     1. Numbness and tingling of both lower extremities 2. Numbness and tingling of both upper extremities 3. Ataxia 4. Falls frequently 5. Hypokalemia 6. Thiamin deficiency  -Chronic with exacerbation, initial sports medicine visit - Numbness and tingling and all extremities consistent with peripheral neuropathy that has progressed and become painful, limiting patient's daily activities, affecting ADLs - Currently no definitive explanation for patient's symptoms.  Patient is not diabetic.  Differential diagnosis includes hypokalemia, thiamine deficiency, central brain pathology, medication side effects, psychosomatic pain.  History of vitamin B12 deficiency, though most recent lab work was WNL.  History of thiamine deficiency, though currently taking vitamin B1 supplementation.  History of vitamin D deficiency, though most recent value WNL.  Lumbar and cervical MRIs from 08/28/2022 and 08/30/2022 were unremarkable. - Take potassium dilatation per PCP recommendations. - Continue thiamine supplementation - Patient has had symptomatic improvement the past on gabapentin.  Recommend increasing to gabapentin 800 mg 3 times daily.  New prescription provided.  Do not recommend taking gabapentin and pregabalin at the same time, so discontinue pregabalin -DME order for cane provided to patient today's visit to help prevent falls  - Patient has difficulty with multiple social determinants of health including difficulty with transportation, housing, financial constraints, mobility limitations.  I will reach out to care management coordinator Shannon [name] who has been following with patient   Pertinent previous records reviewed include patient out reach notes from 01/29/2023, 02/12/2023, 02/12/2023, 12-23/24, PCP note 03/10/2023, prior lumbar and cervical spine MRIs  from July 2024  Follow Up: 5 days after MRI to review results and discuss treatment plan.   Subjective:   I, Shannon [name], am serving as a Neurosurgeon for Doctor Shannon [name]  Chief Complaint: right leg pain   HPI:   03/11/23 Patient is a 58 year old female with right leg pain. Patient states she has hx of neuropathy she has been having numbness and tingling in both her legs right is worse than right. Has had this for 2 years. Has been using gabapentin and that seems to help but sometimes she still has a burning sensation throughout her leg . She has a hard time driving her car she hasn't driven since 09/2022. Antalgic gait she states she is starting to drag her leg from her hip down.  Relevant Historical Information: Bipolar 1, vitamin deficiency, housing insecurity  Additional pertinent review of systems negative.   Current Outpatient Medications:    AMBULATORY NON FORMULARY MEDICATION, Walking cane R20.0 R20.2, Disp: 1 Units, Rfl: 0   atorvastatin (LIPITOR) 20 MG tablet, TAKE ONE TABLET BY MOUTH ONCE DAILY FOR CHOLESTEROL (Patient taking differently: Take 20 mg by mouth daily.), Disp: 90 tablet, Rfl: 1   clonazePAM (KLONOPIN) 0.5 MG tablet, Take 1 tablet (0.5 mg total) by mouth 2 (two) times daily as needed for anxiety., Disp: 60 tablet, Rfl: 0   cyanocobalamin 1000 MCG tablet, Take 1 tablet (1,000 mcg total) by mouth daily., Disp: 30 tablet, Rfl: 0   FLUoxetine (PROZAC) 20 MG capsule, Take 1 capsule (20 mg total) by mouth daily., Disp: 30 capsule, Rfl: 0   gabapentin (NEURONTIN) 400 MG capsule, Take 2 capsules (800 mg total) by mouth 2 (two) times daily., Disp: 240 capsule, Rfl: 0   hydrochlorothiazide (HYDRODIURIL) 12.5 MG tablet, Take 0.5 tablets (6.25 mg total) by mouth daily., Disp: 15 tablet, Rfl:  0   Multiple Vitamins-Minerals (BARIATRIC MULTIVITAMINS/IRON) CAPS, Take 1 capsule by mouth daily., Disp: 30 capsule, Rfl: 2   pantoprazole (PROTONIX) 40 MG tablet, Take 1 tablet (40  mg total) by mouth 2 (two) times daily before a meal., Disp: 180 tablet, Rfl: 0   potassium chloride SA (KLOR-CON M) 20 MEQ tablet, Take 1 tablet (20 mEq total) by mouth daily., Disp: 7 tablet, Rfl: 0   QUEtiapine (SEROQUEL) 25 MG tablet, Take 1 tablet (25 mg total) by mouth 2 (two) times daily., Disp: 30 tablet, Rfl: 0   QUEtiapine (SEROQUEL) 400 MG tablet, Take 1 tablet (400 mg total) by mouth at bedtime., Disp: 30 tablet, Rfl: 0   thiamine (VITAMIN B1) 100 MG tablet, Take 1 tablet (100 mg total) by mouth daily., Disp: 30 tablet, Rfl: 0   topiramate (TOPAMAX) 50 MG tablet, Take 50 mg by mouth 2 (two) times daily., Disp: , Rfl:    Objective:     Vitals:   03/11/23 1300  BP: 132/74  Pulse: (!) 110  SpO2: 98%  Weight: 201 lb (91.2 kg)  Height: 5\' 4"  (1.626 m)      Body mass index is 34.5 kg/m.    Physical Exam:    General: Well-appearing, cooperative, sitting comfortably in no acute distress.  Crying when talking about current social and medical situation HEENT: Normocephalic, atraumatic.   Neck: No gross abnormality.  Cardiovascular: No pallor or cyanosis. Resp: Comfortable WOB.   Abdomen: Non distended.   Skin: Warm and [name]; no focal rashes identified on limited exam. Extremities: No cyanosis or edema.  Neuro: Gross motor and sensory intact.  Gait slow with shortened steps.  Describes increased sensitivity with light palpation to hands and feet, and overall decreased sensation to light palpation of all extremities Psychiatric: Mood and affect are appropriate.    Electronically signed by:  Shannon [name] D.[name] 1:46 PM 03/11/23

## 2023-03-12 ENCOUNTER — Other Ambulatory Visit: Payer: Self-pay

## 2023-03-12 NOTE — Patient Outreach (Unsigned)
  Care Management   Visit Note  03/12/2023 Name: Shannon [name] MRN: [id number] DOB: [birth year]  Subjective: Shannon [name] is a 58 y.o. year old female who is a primary care patient of Shannon [name], Shannon L, NP-C. The Care Management team was consulted for assistance.      Engaged with [name] via telephone.  Assessment:  Review of patient past medical history, allergies, medications, health status, including review of consultants reports, laboratory and other test data, was performed as part of  evaluation and provision of care management services.    Outpatient Encounter Medications as of 03/12/2023  Medication Sig   AMBULATORY NON FORMULARY MEDICATION Walking cane R20.0 R20.2   atorvastatin (LIPITOR) 20 MG tablet TAKE ONE TABLET BY MOUTH ONCE DAILY FOR CHOLESTEROL (Patient taking differently: Take 20 mg by mouth daily.)   clonazePAM (KLONOPIN) 0.5 MG tablet Take 1 tablet (0.5 mg total) by mouth 2 (two) times daily as needed for anxiety.   cyanocobalamin 1000 MCG tablet Take 1 tablet (1,000 mcg total) by mouth daily.   FLUoxetine (PROZAC) 20 MG capsule Take 1 capsule (20 mg total) by mouth daily.   gabapentin (NEURONTIN) 400 MG capsule Take 2 capsules (800 mg total) by mouth 3 (three) times daily.   hydrochlorothiazide (HYDRODIURIL) 12.5 MG tablet Take 0.5 tablets (6.25 mg total) by mouth daily.   Multiple Vitamins-Minerals (BARIATRIC MULTIVITAMINS/IRON) CAPS Take 1 capsule by mouth daily.   pantoprazole (PROTONIX) 40 MG tablet Take 1 tablet (40 mg total) by mouth 2 (two) times daily before a meal.   potassium chloride SA (KLOR-CON M) 20 MEQ tablet Take 1 tablet (20 mEq total) by mouth daily.   QUEtiapine (SEROQUEL) 25 MG tablet Take 1 tablet (25 mg total) by mouth 2 (two) times daily.   QUEtiapine (SEROQUEL) 400 MG tablet Take 1 tablet (400 mg total) by mouth at bedtime.   thiamine (VITAMIN B1) 100 MG tablet Take 1 tablet (100 mg total) by mouth daily.   topiramate (TOPAMAX) 50 MG  tablet Take 50 mg by mouth 2 (two) times daily.   No facility-administered encounter medications on file as of 03/12/2023.

## 2023-03-14 LAB — ANA+ENA+DNA/DS+SCL 70+SJOSSA/B
ANA Titer 1: NEGATIVE
ENA RNP Ab: <0.2 AI (ref 0.0–0.9)
ENA SM Ab Ser-aCnc: <0.2 AI (ref 0.0–0.9)
ENA SSA (RO) Ab: <0.2 AI (ref 0.0–0.9)
ENA SSB (LA) Ab: 3.5 AI — ABNORMAL HIGH (ref 0.0–0.9)
Scleroderma (Scl-70) (ENA) Antibody, IgG: <0.2 AI (ref 0.0–0.9)
dsDNA Ab: 1 [IU]/mL (ref 0–9)

## 2023-03-14 LAB — HIV ANTIBODY (ROUTINE TESTING W REFLEX): HIV 1&2 Ab, 4th Generation: NONREACTIVE

## 2023-03-14 LAB — VITAMIN B1: Vitamin B1 (Thiamine): 22 nmol/L (ref 8–30)

## 2023-03-14 LAB — RHEUMATOID FACTOR: Rheumatoid fact SerPl-aCnc: <10 [IU]/mL (ref <14)

## 2023-03-14 LAB — CYCLIC CITRUL PEPTIDE ANTIBODY, IGG: Cyclic Citrullin Peptide Ab: <16 U

## 2023-03-15 ENCOUNTER — Telehealth: Payer: Self-pay | Admitting: Family Medicine

## 2023-03-15 ENCOUNTER — Other Ambulatory Visit: Payer: Self-pay

## 2023-03-15 DIAGNOSIS — Z9181 History of falling: Secondary | ICD-10-CM

## 2023-03-15 DIAGNOSIS — E785 Hyperlipidemia, unspecified: Secondary | ICD-10-CM

## 2023-03-15 DIAGNOSIS — Z599 Problem related to housing and economic circumstances, unspecified: Secondary | ICD-10-CM

## 2023-03-15 DIAGNOSIS — F3160 Bipolar disorder, current episode mixed, unspecified: Secondary | ICD-10-CM

## 2023-03-15 DIAGNOSIS — Z59819 Housing instability, housed unspecified: Secondary | ICD-10-CM

## 2023-03-15 DIAGNOSIS — R1310 Dysphagia, unspecified: Secondary | ICD-10-CM

## 2023-03-15 MED ORDER — PANTOPRAZOLE SODIUM 40 MG PO TBEC: 40.0000 mg | DELAYED_RELEASE_TABLET | Freq: Two times a day (BID) | ORAL | 1 refills | Status: DC

## 2023-03-15 NOTE — Telephone Encounter (Signed)
Rx sent 

## 2023-03-15 NOTE — Telephone Encounter (Signed)
Copied from CRM [id number]. Topic: Clinical - Medication Question >> Mar 15, 2023  9:30 AM Lennart Pall wrote: Reason for CRM: Patient has had heartburn all weekend, has not had this medication pantoprazole filled for awhile and needs it.

## 2023-03-16 ENCOUNTER — Other Ambulatory Visit: Payer: Self-pay | Admitting: Sports Medicine

## 2023-03-16 ENCOUNTER — Telehealth: Payer: Self-pay | Admitting: Family Medicine

## 2023-03-16 DIAGNOSIS — R700 Elevated erythrocyte sedimentation rate: Secondary | ICD-10-CM

## 2023-03-16 DIAGNOSIS — M2550 Pain in unspecified joint: Secondary | ICD-10-CM

## 2023-03-16 DIAGNOSIS — R768 Other specified abnormal immunological findings in serum: Secondary | ICD-10-CM

## 2023-03-16 NOTE — Telephone Encounter (Signed)
Patient is having pain in her    left breat and is needing further testing and she also [facility] she would like a call back

## 2023-03-16 NOTE — Progress Notes (Unsigned)
elevated sed rate, elevated anti-LA, polyarthralgia.

## 2023-03-17 ENCOUNTER — Other Ambulatory Visit: Payer: Self-pay

## 2023-03-17 ENCOUNTER — Telehealth: Payer: Self-pay

## 2023-03-17 ENCOUNTER — Ambulatory Visit: Payer: Medicare Other | Admitting: Family Medicine

## 2023-03-17 NOTE — Progress Notes (Signed)
Care Guide Pharmacy Note  03/17/2023 Name: Shannon [name] MRN: [id number] DOB: 11/22/1965  Referred By: Avanell Shackleton, NP-C Reason for referral: Care Coordination (Outreach to schedule with Pharm d )   Shannon [name] is a 58 y.o. year old female who is a primary care patient of Suezanne Jacquet, [name], NP-C.  Shannon [name] was referred to the pharmacist for assistance related to: HTN and HLD  Successful contact was made with the patient to discuss pharmacy services including being ready for the pharmacist to call at least 5 minutes before the scheduled appointment time and to have medication bottles and any blood pressure readings ready for review. The patient agreed to meet with the pharmacist via telephone visit on (date/time).03/30/2023   Penne Lash , RMA     [place]  [facility] Guide  Direct Dial: [phone]  Website: [email or web address]

## 2023-03-17 NOTE — Telephone Encounter (Signed)
Called pt and scheduled ov for further evaluation as she was wanting a diagnostic mammogram order

## 2023-03-18 ENCOUNTER — Telehealth (HOSPITAL_COMMUNITY): Payer: Medicare Other | Admitting: Psychiatry

## 2023-03-18 ENCOUNTER — Encounter (HOSPITAL_COMMUNITY): Payer: Self-pay | Admitting: Psychiatry

## 2023-03-18 ENCOUNTER — Encounter (HOSPITAL_COMMUNITY): Payer: Self-pay | Admitting: *Deleted

## 2023-03-18 ENCOUNTER — Ambulatory Visit (HOSPITAL_COMMUNITY): Payer: Medicare Other | Admitting: Licensed Clinical Social Worker

## 2023-03-18 VITALS — Wt 201.0 lb

## 2023-03-18 DIAGNOSIS — F319 Bipolar disorder, unspecified: Secondary | ICD-10-CM | POA: Diagnosis not present

## 2023-03-18 DIAGNOSIS — F4310 Post-traumatic stress disorder, unspecified: Secondary | ICD-10-CM

## 2023-03-18 DIAGNOSIS — F411 Generalized anxiety disorder: Secondary | ICD-10-CM | POA: Diagnosis not present

## 2023-03-18 MED ORDER — QUETIAPINE FUMARATE 400 MG PO TABS: 400.0000 mg | ORAL_TABLET | Freq: Every day | ORAL | 1 refills | Status: DC

## 2023-03-18 MED ORDER — CLONAZEPAM 0.5 MG PO TABS: 0.5000 mg | ORAL_TABLET | Freq: Two times a day (BID) | ORAL | 1 refills | Status: DC | PRN

## 2023-03-18 MED ORDER — FLUOXETINE HCL 20 MG PO CAPS: 20.0000 mg | ORAL_CAPSULE | Freq: Every day | ORAL | 1 refills | Status: DC

## 2023-03-18 MED ORDER — QUETIAPINE FUMARATE 25 MG PO TABS: 25.0000 mg | ORAL_TABLET | Freq: Two times a day (BID) | ORAL | 1 refills | Status: DC

## 2023-03-18 NOTE — Patient Outreach (Signed)
Care Management   Visit Note   Name: Shannon [name] MRN: [id number] DOB: 1965/10/29  Subjective: Shannon [name] is a 58 y.o. year old female who is a primary care patient of Suezanne Jacquet, [name], NP-C. The Care Management team was consulted for assistance.      Engaged with Shannon [name] via telephone.  Assessment:  Review of patient past medical history, allergies, medications, health status, including review of consultants reports, laboratory and other test data, was performed as part of  evaluation and provision of care management services.    Outpatient Encounter Medications as of 03/15/2023  Medication Sig   AMBULATORY NON FORMULARY MEDICATION Walking cane R20.0 R20.2   atorvastatin (LIPITOR) 20 MG tablet TAKE ONE TABLET BY MOUTH ONCE DAILY FOR CHOLESTEROL (Patient not taking: Reported on 03/15/2023)   clonazePAM (KLONOPIN) 0.5 MG tablet Take 1 tablet (0.5 mg total) by mouth 2 (two) times daily as needed for anxiety.   cyanocobalamin 1000 MCG tablet Take 1 tablet (1,000 mcg total) by mouth daily.   FLUoxetine (PROZAC) 20 MG capsule Take 1 capsule (20 mg total) by mouth daily.   gabapentin (NEURONTIN) 400 MG capsule Take 2 capsules (800 mg total) by mouth 3 (three) times daily.   hydrochlorothiazide (HYDRODIURIL) 12.5 MG tablet Take 0.5 tablets (6.25 mg total) by mouth daily.   Multiple Vitamins-Minerals (BARIATRIC MULTIVITAMINS/IRON) CAPS Take 1 capsule by mouth daily.   pantoprazole (PROTONIX) 40 MG tablet Take 1 tablet (40 mg total) by mouth 2 (two) times daily before a meal.   potassium chloride SA (KLOR-CON M) 20 MEQ tablet Take 1 tablet (20 mEq total) by mouth daily.   QUEtiapine (SEROQUEL) 25 MG tablet Take 1 tablet (25 mg total) by mouth 2 (two) times daily.   QUEtiapine (SEROQUEL) 400 MG tablet Take 1 tablet (400 mg total) by mouth at bedtime.   thiamine (VITAMIN B1) 100 MG tablet Take 1 tablet (100 mg total) by mouth daily.   topiramate (TOPAMAX) 50 MG tablet Take 50 mg by  mouth 2 (two) times daily.   No facility-administered encounter medications on file as of 03/15/2023.    Interventions:   Goals Addressed             This Visit's Progress    Care Management       Current Barriers:  Care Management support related to Mixed Bipolar Disorder, Hypertension. Mobility Limitations Lacks caregiver support.  Corporate treasurer.  Transportation barriers Housing needs  Planned Interventions: Reviewed current plan for Mixed Bipolar Disorder and SDOH needs. Reports having all medications and taking as prescribed. Denies hallucinations or suicidal ideations. Reports gabapentin has been helping with pain however her balance remains unstable due to numbness in her lower extremities. Reports one fall since being in her current rental space. Denies injuries. She completed the initial evaluation with Sports Medicine on 03/11/23. Current plan is to start outpatient physical therapy. Discussed current plan for housing. Reports her friend/support Shannon [name] was no longer able to assist her with hotel costs and assisted her with finding a room rental in a shared home in [place] for $550/month. Notes the home is shared with two other individuals. She expressed concerns regarding the condition of the home along with possible insect and rodent infestation. Reports she was not required to sign a lease and expressed interest in relocating if another option becomes available. Will place referral for additional outreach with SW. RNCM previously provided contact for a Disability Advocate. She confirmed contacting the office and being assigned to  a case Financial controller. Reports an appointment was scheduled for 03/17/23 to discuss available resources and plan for long term housing. Reports being informed that the wait list for housing if currently 6 months to a year.  Discussed multiple SDOH needs. These needs have increased as her mobility has declined. Reports having a vehicle. Reports she will  still need transportation support due to not being able to drive. Reports she is not comfortable asking one of the occupants in the home to drive her to required medical appointments. The PCP clinic provided a transportation waiver for her clinic visit, however this may not be an option for the multiple visits that will be required for outpatient therapy. Will place referral for assistance with additional resources. Discussed plan to seek spousal support during upcoming divorce hearing. Her financial strain has worsened since returning to the [name] due to decreased options for housing and resources. RNCM reviewed several options for meal delivery and nutrition assistance. She confirmed following up with Shannon [name] for possible deliveries. Will place referral for assistance with additional resources. Thorough review of worsening symptoms that require immediate medical attention.  Update 03/15/23: Shannon [name] reports doing well today. Reports receiving medications delivery today. Notes only receiving a 7 day supply of potassium and wants to confirm this was the correct amount. Messaged PCP to confirm. Reports increased episodes of acid reflux over the weekend. Denies episodes of nausea or vomiting. Reports currently tolerating small meals and staying well hydrated.  Discussed current living situation in a shared home. Reports poor insulation in the home but she has a small heater in the room. Referrals placed for extensive SDOH needs. Reports attempting to move constantly in her room to prevent cramps and improve circulation. Denies falls over the weekend.  Confirmed speaking the Disability Advocate/Shannon [name]. Reports discussing lack of transportation for her appointment on 03/17/23. Reports Shannon [name] agreed to come to the home to complete the initial assessment.  Reviewed worsening symptoms that require immediate medical attention.    Self-Care/Patient Goals: Take medications as prescribed   Attend all scheduled  provider appointments Call pharmacy for medication refills 3-7 days in advance of running out of medications Call provider office for new concerns or questions  Work with the care team to address multiple SDOH needs Call the Suicide and Crisis Lifeline: 988 Call the [place] National Suicide Prevention Lifeline: 919-806-7290 or TTY:    608 175 5645 TTY (236)883-6099) to talk to a trained counselor Call 1-800-273-TALK (toll free, 24 hour hotline) Go to [facility] [address], [place] [phone]) Call 911 if experiencing a Mental Health or Behavioral Health Crisis            PLAN Will follow up this week.    Juanell Fairly [facility] Population [facility] RN Care Manager Direct Dial: [phone]  Fax: [phone] Website: [email or web address]

## 2023-03-18 NOTE — Progress Notes (Signed)
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 mg/dL  ANA+ENA+DNA/DS+Scl 44+WNUUVO/Z     Status: Abnormal   Collection Time: 03/11/23  1:48 PM  Result Value Ref Range   ANA Titer 1 Negative     Comment:                                      Negative   <1:80                                      Borderline  1:80                                      Positive   >1:80 ICAP nomenclature: AC-0 For more information about Hep-2 cell patterns use ANApatterns.org, the official website for the International Consensus on Antinuclear Antibody (ANA) Patterns (ICAP).    dsDNA Ab 1 0 - 9 IU/mL    Comment:  Negative      <5                                    Equivocal  5 - 9                                    Positive      >9    ENA RNP Ab <0.2 0.0 - 0.9 AI   ENA SM Ab Ser-aCnc <0.2 0.0 - 0.9 AI   Scleroderma (Scl-70) (ENA) Antibody, IgG <0.2 0.0 - 0.9 AI   ENA SSA (RO) Ab <0.2 0.0 - 0.9 AI   ENA SSB (LA) Ab 3.5 (H) 0.0 - 0.9 AI  Uric acid     Status: None   Collection Time: 03/11/23  1:48 PM  Result Value Ref Range   Uric Acid, Serum 3.9 2.4 - 7.0 mg/dL  Sedimentation rate     Status: Abnormal   Collection Time: 03/11/23  1:48 PM  Result Value Ref Range   Sed Rate 117 (H) 0 - 30 mm/hr  Rheumatoid factor     Status: None   Collection Time: 03/11/23  1:48 PM  Result Value Ref Range   Rheumatoid fact SerPl-aCnc <10 <14 IU/mL  Ferritin     Status: None   Collection Time: 03/11/23  1:48 PM  Result Value Ref Range   Ferritin 13.1 10.0 - 291.0 ng/mL  Cyclic citrul peptide antibody, IgG     Status: None   Collection Time: 03/11/23  1:48 PM  Result Value Ref Range   Cyclic Citrullin Peptide Ab <40 UNITS    Comment: Reference Range Negative:            <20 Weak Positive:       20-39 Moderate Positive:   40-59 Strong Positive:     >59 .   C-reactive protein     Status: None   Collection Time: 03/11/23  1:48 PM  Result Value Ref Range   CRP 1.5 0.5 -  20.0 mg/dL     Psychiatric Specialty Exam: Physical Exam  Review of Systems  Constitutional:  Positive for fatigue.  Musculoskeletal:        Leg pain  Neurological:  Positive for numbness.    Weight 201 lb (91.2 kg).There is no height or weight on file to calculate BMI.  General Appearance: Fairly Groomed  Eye Contact:  Fair  Speech:  Slow  Volume:  Decreased  Mood:  Anxious, Depressed, and Hopeless  Affect:  Constricted and Depressed  Thought Process:  Descriptions of Associations: Intact  Orientation:  Full (Time, Place, and Person)  Thought Content:  Rumination  Suicidal Thoughts:  No  Homicidal Thoughts:  No  Memory:  Immediate;   Fair Recent;   Fair Remote;   Fair  Judgement:  Fair  Insight:  Shallow  Psychomotor Activity:  Decreased  Concentration:  Concentration: Fair and Attention Span: Fair  Recall:  [employer] of Knowledge:  Fair  Language:  Fair  Akathisia:  No  Handed:  Right  AIMS (if indicated):     Assets:  Communication Skills Desire for Improvement Housing  ADL's:  Intact  Cognition:  WNL  Sleep:  fair     Assessment/Plan: Bipolar I disorder (HCC) - Plan: clonazePAM (KLONOPIN) 0.5 MG tablet, QUEtiapine (SEROQUEL) 25 MG tablet, QUEtiapine (  SEROQUEL) 400 MG tablet  PTSD (post-traumatic stress disorder) - Plan: clonazePAM (KLONOPIN) 0.5 MG tablet, FLUoxetine (PROZAC) 20 MG capsule  GAD (generalized anxiety disorder) - Plan: FLUoxetine (PROZAC) 20 MG capsule  I reviewed blood work results.  Labs are stable other than low potassium.  She is doing much better since back on previous medication which are Seroquel 400 mg at bedtime, Klonopin 0.5 mg twice a day, Prozac 20 mg daily and she can take Seroquel XR 25 mg 1-2 times a day.  I do believe she need social services to help her social needs especially stable living situation, ride to the physical therapy and doctor's appointment and possible home health aide to monitor her medication.  Patient like to  have send the notes to her primary care.  She has appointment coming up soon.  She is concerned about her chronic health issues which are leg pain, neuropathy, fatigue.  She has no suicidal thoughts.  We agreed to have a follow-up in 2 months.  I also encouraged to call us back if she feels worsening of symptoms.   Follow Up Instructions:     I discussed the assessment and treatment plan with the patient. The patient was provided an opportunity to ask questions and all were answered. The patient agreed with the plan and demonstrated an understanding of the instructions.   The patient was advised to call back or seek an in-person evaluation if the symptoms worsen or if the condition fails to improve as anticipated.    Collaboration of Care: Other provider involved in patient's care AEB notes are available in epic to review  Patient/Guardian was advised Release of Information must be obtained prior to any record release in order to collaborate their care with an outside provider. Patient/Guardian was advised if they have not already done so to contact the registration department to sign all necessary forms in order for [name] to release information regarding their care.   Consent: Patient/Guardian gives verbal consent for treatment and assignment of benefits for services provided during this visit. Patient/Guardian expressed understanding and agreed to proceed.     I provided 25 minutes of non face to face time during this encounter.  Note: This document was prepared by [employer] voice dictation technology and any errors that results from this process are unintentional.    Cleotis Nipper, MD 03/18/2023

## 2023-03-19 ENCOUNTER — Telehealth: Payer: Self-pay | Admitting: *Deleted

## 2023-03-19 ENCOUNTER — Other Ambulatory Visit: Payer: Self-pay

## 2023-03-19 NOTE — Progress Notes (Signed)
Complex Care Management Note  Care Guide Note 03/19/2023 Name: AL GAGEN MRN: [id number] DOB: 08-Dec-1965  ANAHLIA ISEMINGER is a 58 y.o. year old female who sees [place], [place], NP-C for primary care. I reached out to [name] by phone today to offer complex care management services.  Ms. Netzer was given information about Complex Care Management services today including:   The Complex Care Management services include support from the care team which includes your Nurse Coordinator, Clinical Social Worker, or Pharmacist.  The Complex Care Management team is here to help remove barriers to the health concerns and goals most important to you. Complex Care Management services are voluntary, and the patient may decline or stop services at any time by request to their care team member.   Complex Care Management Consent Status: Patient agreed to services and verbal consent obtained.   Follow up plan:  Telephone appointment with complex care management team member scheduled for:  03/26/2023  Encounter Outcome:  Patient Scheduled  Burman Nieves, CMA, Care Guide [facility]  [facility], [facility] Guide Direct Dial: [phone]  Fax: [phone] Website: [email or web address]

## 2023-03-22 ENCOUNTER — Ambulatory Visit: Payer: Medicare Other | Admitting: Physical Therapy

## 2023-03-22 NOTE — Patient Outreach (Signed)
Care Management   Visit Note   Name: Shannon [name] MRN: [id number] DOB: [birth year]  Subjective: Shannon [name] is a 58 y.o. year old female who is a primary care patient of Shannon [name], [name], NP-C. The Care Management team was consulted for assistance.      Engaged with Shannon [name] via telephone.   Assessment:  Review of patient past medical history, allergies, medications, health status, including review of consultants reports, laboratory and other test data, was performed as part of  evaluation and provision of care management services.    Outpatient Encounter Medications as of 03/19/2023  Medication Sig   AMBULATORY NON FORMULARY MEDICATION Walking cane R20.0 R20.2   atorvastatin (LIPITOR) 20 MG tablet TAKE ONE TABLET BY MOUTH ONCE DAILY FOR CHOLESTEROL (Patient not taking: Reported on 03/15/2023)   clonazePAM (KLONOPIN) 0.5 MG tablet Take 1 tablet (0.5 mg total) by mouth 2 (two) times daily as needed for anxiety.   cyanocobalamin 1000 MCG tablet Take 1 tablet (1,000 mcg total) by mouth daily.   FLUoxetine (PROZAC) 20 MG capsule Take 1 capsule (20 mg total) by mouth daily.   gabapentin (NEURONTIN) 400 MG capsule Take 2 capsules (800 mg total) by mouth 3 (three) times daily.   hydrochlorothiazide (HYDRODIURIL) 12.5 MG tablet Take 0.5 tablets (6.25 mg total) by mouth daily.   Multiple Vitamins-Minerals (BARIATRIC MULTIVITAMINS/IRON) CAPS Take 1 capsule by mouth daily.   pantoprazole (PROTONIX) 40 MG tablet Take 1 tablet (40 mg total) by mouth 2 (two) times daily before a meal.   potassium chloride SA (KLOR-CON M) 20 MEQ tablet Take 1 tablet (20 mEq total) by mouth daily.   QUEtiapine (SEROQUEL) 25 MG tablet Take 1 tablet (25 mg total) by mouth 2 (two) times daily.   QUEtiapine (SEROQUEL) 400 MG tablet Take 1 tablet (400 mg total) by mouth at bedtime.   thiamine (VITAMIN B1) 100 MG tablet Take 1 tablet (100 mg total) by mouth daily.   topiramate (TOPAMAX) 50 MG tablet Take 50 mg by  mouth 2 (two) times daily.   No facility-administered encounter medications on file as of 03/19/2023.    Interventions:   Goals Addressed             This Visit's Progress    Care Management       Current Barriers:  Care Management support related to Mixed Bipolar Disorder, Hypertension. Mobility Limitations Lacks caregiver support.  Corporate treasurer.  Transportation barriers Housing needs  Planned Interventions: Reviewed current plan for Mixed Bipolar Disorder and SDOH needs. Reports having all medications and taking as prescribed. Denies hallucinations or suicidal ideations. Reports gabapentin has been helping with pain however her balance remains unstable due to numbness in her lower extremities. Reports one fall since being in her current rental space. Denies injuries. She completed the initial evaluation with Sports Medicine on 03/11/23. Current plan is to start outpatient physical therapy. Discussed current plan for housing. Reports her friend/support Shannon [name] was no longer able to assist her with hotel costs and assisted her with finding a room rental in a shared home in [place] for $550/month. Notes the home is shared with two other individuals. She expressed concerns regarding the condition of the home along with possible insect and rodent infestation. Reports she was not required to sign a lease and expressed interest in relocating if another option becomes available. Will place referral for additional outreach with SW. RNCM previously provided contact for a Disability Advocate. She confirmed contacting the office and being assigned  to a case worker. Reports an appointment was scheduled for 03/17/23 to discuss available resources and plan for long term housing. Reports being informed that the wait list for housing if currently 6 months to a year.  Discussed multiple SDOH needs. These needs have increased as her mobility has declined. Reports having a vehicle. Reports she will  still need transportation support due to not being able to drive. Reports she is not comfortable asking one of the occupants in the home to drive her to required medical appointments. The PCP clinic provided a transportation waiver for her clinic visit, however this may not be an option for the multiple visits that will be required for outpatient therapy. Will place referral for assistance with additional resources. Discussed plan to seek spousal support during upcoming divorce hearing. Her financial strain has worsened since returning to the [name] due to decreased options for housing and resources. RNCM reviewed several options for meal delivery and nutrition assistance. She confirmed following up with Shannon [name] for possible deliveries. Will place referral for assistance with additional resources. Thorough review of worsening symptoms that require immediate medical attention.  Update 03/15/23: Shannon [name] reports doing well today. Reports receiving medications delivery today. Notes only receiving a 7 day supply of potassium and wants to confirm this was the correct amount. Messaged PCP to confirm. Reports increased episodes of acid reflux over the weekend. Denies episodes of nausea or vomiting. Reports currently tolerating small meals and staying well hydrated.  Discussed current living situation in a shared home. Reports poor insulation in the home but she has a small heater in the room. Referrals placed for extensive SDOH needs. Reports attempting to move constantly in her room to prevent cramps and improve circulation. Denies falls over the weekend.  Confirmed speaking the Disability Advocate/[place]. Reports discussing lack of transportation for her appointment on 03/17/23. Reports Shannon [name] agreed to come to the home to complete the initial assessment.  Reviewed worsening symptoms that require immediate medical attention.  Update 03/17/23:  Discussed plan for medications. Informed of discussion with NP  Orson Aloe that only a 7 day supply of potassium was ordered since she is no longer taking diuretics that deplete her potassium levels. Advised to take the complete 7 day course as prescribed. She is aware that additional labs will be drawn during her next clinic visit to determine if additional doses are required.  Reports concerns r/t left breast pain. A order for mammogram was placed by her PCP  Reports completing initial assessment with Disability advocate/[name] via telephone today. Reports she will come to the home tomorrow to complete documents that require signatures. Discussed plan for multiple SDOH needs. Confirmed having e-links for Medicaid and SNAP applications. Reports she will apply online. Reviewed pending appointments. She is scheduled for virtual outreach with [name] on 03/18/23. Reviewed worsening symptoms that require immediate medical attention.  Update 03/19/23:  Ms. Terwilliger reports doing well today. Reports pending court date for divorce settlement in February.  Requested a letter to support her claim that her spouse is the cause of her current financial and housing situation. Also requested presence of a staff member during the court case. Reports she does not have legal support and her friend/support Shannon [name] recently started a new position and will not be available to speak on her behalf during the upcoming divorce hearing. Informed that care team can not perform duties in that capacity, however the Adult Protective Services team will be contacted to determine if they are legally allowed to  provide documentation obtained during their assessment and case management. She is agreeable to this plan and agreeable to further discussion with the APS team if needed.     Self-Care/Patient Goals: Take medications as prescribed   Attend all scheduled provider appointments Call pharmacy for medication refills 3-7 days in advance of running out of medications Call provider office for new  concerns or questions  Work with the care team to address multiple SDOH needs Call the Suicide and Crisis Lifeline: 988 Call the [place] National Suicide Prevention Lifeline: (936)791-2465 or TTY:    (773) 870-3091 TTY (725)743-8742) to talk to a trained counselor Call 1-800-273-TALK (toll free, 24 hour hotline) Go to [facility] [address], [place] [phone]) Call 911 if experiencing a Mental Health or Behavioral Health Crisis            PLAN Will follow up next week.    Juanell Fairly [facility] Population Health RN Care Manager Direct Dial: [phone]  Fax: [phone] Website: [email or web address]

## 2023-03-23 ENCOUNTER — Ambulatory Visit: Payer: Medicare Other | Admitting: Neurology

## 2023-03-24 ENCOUNTER — Other Ambulatory Visit: Payer: Self-pay

## 2023-03-24 ENCOUNTER — Ambulatory Visit: Payer: Medicare Other | Admitting: Family Medicine

## 2023-03-24 NOTE — Patient Outreach (Signed)
Care Management   Visit Note  03/24/2023 Name: Shannon [name] MRN: [id number] DOB: 05-10-1965  Subjective: Shannon [name] is a 58 y.o. year old female who is a primary care patient of Shannon [name], Shannon [name], Shannon-C. The Care Management team was consulted for assistance.      Care Coordination: Shannon [name]/Shannon [name]   Interventions:   Goals Addressed             This Visit's Progress    Care Management       Current Barriers:  Care Management support related to Mixed Bipolar Disorder, Hypertension. Mobility Limitations Lacks caregiver support.  Corporate treasurer.  Transportation barriers Housing needs  Planned Interventions: Reviewed current plan for Mixed Bipolar Disorder and SDOH needs. Reports having all medications and taking as prescribed. Denies hallucinations or suicidal ideations. Reports gabapentin has been helping with pain however her balance remains unstable due to numbness in her lower extremities. Reports one fall since being in her current rental space. Denies injuries. She completed the initial evaluation with Sports Medicine on 03/11/23. Current plan is to start outpatient physical therapy. Discussed current plan for housing. Reports her friend/support Shannon [name] was no longer able to assist her with hotel costs and assisted her with finding a room rental in a shared home in [place] for $550/month. Notes the home is shared with two other individuals. She expressed concerns regarding the condition of the home along with possible insect and rodent infestation. Reports she was not required to sign a lease and expressed interest in relocating if another option becomes available. Will place referral for additional outreach with SW. RNCM previously provided contact for a Disability Advocate. She confirmed contacting the office and being assigned to a case worker. Reports an appointment was scheduled for 03/17/23 to discuss available resources and plan for long term housing.  Reports being informed that the wait list for housing if currently 6 months to a year.  Discussed multiple SDOH needs. These needs have increased as her mobility has declined. Reports having a vehicle. Reports she will still need transportation support due to not being able to drive. Reports she is not comfortable asking one of the occupants in the home to drive her to required medical appointments. The PCP clinic provided a transportation waiver for her clinic visit, however this may not be an option for the multiple visits that will be required for outpatient therapy. Will place referral for assistance with additional resources. Discussed plan to seek spousal support during upcoming divorce hearing. Her financial strain has worsened since returning to the [name] due to decreased options for housing and resources. RNCM reviewed several options for meal delivery and nutrition assistance. She confirmed following up with Shannon [name] for possible deliveries. Will place referral for assistance with additional resources. Thorough review of worsening symptoms that require immediate medical attention.  Update 03/15/23: Shannon [name] reports doing well today. Reports receiving medications delivery today. Notes only receiving a 7 day supply of potassium and wants to confirm this was the correct amount. Messaged PCP to confirm. Reports increased episodes of acid reflux over the weekend. Denies episodes of nausea or vomiting. Reports currently tolerating small meals and staying well hydrated.  Discussed current living situation in a shared home. Reports poor insulation in the home but she has a small heater in the room. Referrals placed for extensive SDOH needs. Reports attempting to move constantly in her room to prevent cramps and improve circulation. Denies falls over the weekend.  Confirmed speaking the Disability Advocate/Shannon [name].  Reports discussing lack of transportation for her appointment on 03/17/23. Reports Shannon [name]  agreed to come to the home to complete the initial assessment.  Reviewed worsening symptoms that require immediate medical attention.  Update 03/17/23:  Discussed plan for medications. Informed of discussion with Shannon [name] that only a 7 day supply of potassium was ordered since she is no longer taking diuretics that deplete her potassium levels. Advised to take the complete 7 day course as prescribed. She is aware that additional labs will be drawn during her next clinic visit to determine if additional doses are required.  Reports concerns r/t left breast pain. A order for mammogram was placed by her PCP  Reports completing initial assessment with Disability advocate/Shannon [name] via telephone today. Reports she will come to the home tomorrow to complete documents that require signatures. Discussed plan for multiple SDOH needs. Confirmed having e-links for Medicaid and SNAP applications. Reports she will apply online. Reviewed pending appointments. She is scheduled for virtual outreach with Shannon [name] on 03/18/23. Reviewed worsening symptoms that require immediate medical attention.  Update 03/19/23:  Shannon [name] reports doing well today. Reports pending court date for divorce settlement in February.  Requested a letter to support her claim that her spouse is the cause of her current financial and housing situation. Also requested presence of a staff member during the court case. Reports she does not have legal support and her friend/support Shannon [name] recently started a new position and will not be available to speak on her behalf during the upcoming divorce hearing. Informed that care team can not perform duties in that capacity, however the Adult Protective Services team will be contacted to determine if they are legally allowed to provide documentation obtained during their assessment and case management. She is agreeable to this plan and agreeable to further discussion with the APS team if needed. Update  03/24/23: Additional outreach attempted with Shannon [name]/Shannon [name] regarding Shannon [name]'s APS report. Left voice message for APS team/Case Worker to return call     Self-Care/Patient Goals: Take medications as prescribed   Attend all scheduled provider appointments Call pharmacy for medication refills 3-7 days in advance of running out of medications Call provider office for new concerns or questions  Work with the care team to address multiple SDOH needs Call the Suicide and Crisis Lifeline: 988 Call the [place] National Suicide Prevention Lifeline: (517)865-1239 or TTY:    (469)235-5164 TTY (347)597-0256) to talk to a trained counselor Call 1-800-273-TALK (toll free, 24 hour hotline) Go to [facility] [address] [place] [phone]) Call 911 if experiencing a Mental Health or Behavioral Health Crisis           PLAN Pending return call from [facility] Shannon [name]. Will follow up and update Ms. Markwood this week.    Juanell Fairly [facility] Population Health RN Care Manager Direct Dial: [phone]  Fax: [phone] Website: [email or web address]

## 2023-03-26 ENCOUNTER — Ambulatory Visit: Payer: Self-pay | Admitting: Licensed Clinical Social Worker

## 2023-03-26 ENCOUNTER — Other Ambulatory Visit: Payer: Self-pay

## 2023-03-26 ENCOUNTER — Ambulatory Visit: Payer: Self-pay

## 2023-03-26 NOTE — Patient Instructions (Signed)
Visit Information  Thank you for taking time to visit with me today. Please don't hesitate to contact me if I can be of assistance to you.   Following are the goals we discussed today:  Patient will contact [employer] for food, [employer] fast streamer and furniture. Patient will contact [employer] for food and clothing. Patient will await response from [employer] for transportation. Patient will review housing list and add name to waiting lists.   Our next appointment is by telephone on 04/02/23 at 9:30am  Please call the care guide team at [phone] if you need to cancel or reschedule your appointment.   If you are experiencing a Mental Health or Behavioral Health Crisis or need someone to talk to, please call 911  Patient verbalizes understanding of instructions and care plan provided today and agrees to view in MyChart. Active MyChart status and patient understanding of how to access instructions and care plan via MyChart confirmed with patient.     Telephone follow up appointment with care management team member scheduled for:04/02/23 at 9:30am  Lysle Morales, BSW [place]  [facility], [facility] Social Worker Direct Dial: [phone]  Fax: [phone] Website: [email or web address]

## 2023-03-26 NOTE — Patient Outreach (Signed)
  Care Coordination   Initial Visit Note   03/26/2023 Name: VIOLETT HOBBS MRN: [id number] DOB: 1965/04/20  SERRINA MINOGUE is a 58 y.o. year old female who sees [place], [place], NP-C for primary care. I spoke with  [name] by phone today.  What matters to the patients health and wellness today?  Patient has recently moved into a rooming house but there are bugs and the kitchen is nasty. Patient is sleeping on a air mattress. Beginning in February patient will be stable financially as the rent is $550 and she will have foodstamps.     Goals Addressed             This Visit's Progress    Care Coordination Activities       Interventions Today    Flowsheet Row Most Recent Value  Chronic Disease   Chronic disease during today's visit Other  [Neuropathy]  General Interventions   General Interventions Discussed/Reviewed General Interventions Discussed, General Interventions Reviewed, Community Resources  [[employer] application completed and Part B submitted to provider.Referred to food banks for additional assistance.Housing list provided.Referred to [facility] for furniture and clothing.Referred to [employer] for food and clothing.]              SDOH assessments and interventions completed:  Yes  SDOH Interventions Today    Flowsheet Row Most Recent Value  SDOH Interventions   Food Insecurity Interventions Other (Comment)  [Pt has been approved for foodstamps and SW will provide food bank list.]  Housing Interventions Other (Comment)  [Pt moved into rooming house.]  Transportation Interventions Other (Comment)  [[employer] application completed, providers office provide transportation]  Utilities Interventions Intervention Not Indicated        Care Coordination Interventions:  Yes, provided   Follow up plan: Follow up call scheduled for 04/02/23 at 9:30am    Encounter Outcome:  Patient Visit Completed

## 2023-03-26 NOTE — Patient Outreach (Signed)
  Care Coordination   Initial Visit Note   03/26/2023 Name: Shannon [name] MRN: [id number] DOB: Jan 31, 1966  Shannon [name] is a 58 y.o. year old female who sees [place], [place], NP-C for primary care. I spoke with  [name] by phone today.  What matters to the patients health and wellness today?  Symptom Management      SDOH assessments and interventions completed:  No     Care Coordination Interventions:  Yes, provided  Interventions Today    Flowsheet Row Most Recent Value  Chronic Disease   Chronic disease during today's visit Other  [Anxiety and Depression]  General Interventions   General Interventions Discussed/Reviewed General Interventions Discussed, Communication with  [LCSW Tywanda Rice introduced self and explained role in care coordination. Informed her that LCSW Felton Clinton will assist in patient care needs]  Communication with Social Work  Jay Schlichter collaborated with [employer] and LCSW colleagues]  Mental Health Interventions   Mental Health Discussed/Reviewed Mental Health Discussed  [Validation and Encouragement provided]       Follow up plan: No further intervention required.   Encounter Outcome:  Patient Visit Completed   Jenel Lucks, LCSW [facility]  [facility], [facility] Clinical Social Worker Direct Dial: [phone]  Fax: [phone] Website: [email or web address] 11:04 AM

## 2023-03-26 NOTE — Patient Outreach (Signed)
Care Management   Visit Note  03/26/2023 Name: Shannon [name] MRN: [id number] DOB: Feb 26, 1965  Subjective: HEATHERLY STENNER is a 58 y.o. year old female who is a primary care patient of Suezanne Jacquet, [name], NP-C. The Care Management team was consulted for assistance.      Care Coordination: Collaboration with [place] DSS  Assessment:   Goals Addressed             This Visit's Progress    Effectively Manage Symptoms r/t Mixed Bipolar Disorder and Hypertension       Current Barriers:  Care Management support related to Mixed Bipolar Disorder, Hypertension. Mobility Limitations Lacks caregiver support.  Corporate treasurer.  Transportation barriers Housing needs  Planned Interventions: Reviewed current plan for Mixed Bipolar Disorder and SDOH needs. Reports having all medications and taking as prescribed. Denies hallucinations or suicidal ideations. Reports gabapentin has been helping with pain however her balance remains unstable due to numbness in her lower extremities. Reports one fall since being in her current rental space. Denies injuries. She completed the initial evaluation with Sports Medicine on 03/11/23. Current plan is to start outpatient physical therapy. Discussed current plan for housing. Reports her friend/support [name] was no longer able to assist her with hotel costs and assisted her with finding a room rental in a shared home in [place] for $550/month. Notes the home is shared with two other individuals. She expressed concerns regarding the condition of the home along with possible insect and rodent infestation. Reports she was not required to sign a lease and expressed interest in relocating if another option becomes available. Will place referral for additional outreach with SW. RNCM previously provided contact for a Disability Advocate. She confirmed contacting the office and being assigned to a case worker. Reports an appointment was scheduled for 03/17/23 to  discuss available resources and plan for long term housing. Reports being informed that the wait list for housing if currently 6 months to a year.  Discussed multiple SDOH needs. These needs have increased as her mobility has declined. Reports having a vehicle. Reports she will still need transportation support due to not being able to drive. Reports she is not comfortable asking one of the occupants in the home to drive her to required medical appointments. The PCP clinic provided a transportation waiver for her clinic visit, however this may not be an option for the multiple visits that will be required for outpatient therapy. Will place referral for assistance with additional resources. Discussed plan to seek spousal support during upcoming divorce hearing. Her financial strain has worsened since returning to the [name] due to decreased options for housing and resources. RNCM reviewed several options for meal delivery and nutrition assistance. She confirmed following up with [name] for possible deliveries. Will place referral for assistance with additional resources. Thorough review of worsening symptoms that require immediate medical attention.  Update 03/15/23: Ms. Genrich reports doing well today. Reports receiving medications delivery today. Notes only receiving a 7 day supply of potassium and wants to confirm this was the correct amount. Messaged PCP to confirm. Reports increased episodes of acid reflux over the weekend. Denies episodes of nausea or vomiting. Reports currently tolerating small meals and staying well hydrated.  Discussed current living situation in a shared home. Reports poor insulation in the home but she has a small heater in the room. Referrals placed for extensive SDOH needs. Reports attempting to move constantly in her room to prevent cramps and improve circulation. Denies falls over  the weekend.  Confirmed speaking the Disability Advocate/[place]. Reports discussing lack of  transportation for her appointment on 03/17/23. Reports [name] agreed to come to the home to complete the initial assessment.  Reviewed worsening symptoms that require immediate medical attention.  Update 03/17/23:  Discussed plan for medications. Informed of discussion with NP Orson Aloe that only a 7 day supply of potassium was ordered since she is no longer taking diuretics that deplete her potassium levels. Advised to take the complete 7 day course as prescribed. She is aware that additional labs will be drawn during her next clinic visit to determine if additional doses are required.  Reports concerns r/t left breast pain. A order for mammogram was placed by her PCP  Reports completing initial assessment with Disability advocate/[name] via telephone today. Reports she will come to the home tomorrow to complete documents that require signatures. Discussed plan for multiple SDOH needs. Confirmed having e-links for Medicaid and SNAP applications. Reports she will apply online. Reviewed pending appointments. She is scheduled for virtual outreach with [name] on 03/18/23. Reviewed worsening symptoms that require immediate medical attention.  Update 03/19/23:  Ms. Withem reports doing well today. Reports pending court date for divorce settlement in February.  Requested a letter to support her claim that her spouse is the cause of her current financial and housing situation. Also requested presence of a staff member during the court case. Reports she does not have legal support and her friend/support [name] recently started a new position and will not be available to speak on her behalf during the upcoming divorce hearing. Informed that care team can not perform duties in that capacity, however the Adult Protective Services team will be contacted to determine if they are legally allowed to provide documentation obtained during their assessment and case management. She is agreeable to this plan and agreeable  to further discussion with the APS team if needed. Update 03/24/23: Additional outreach attempted with [place]/DSS regarding Ms. Weng's APS report. Left voice message for APS team/Case Worker to return call Update 03/26/23:  Message received from [place]/DSS. Discussed patient's request for documentation r/t her previous APS report for upcoming court hearing. RNCM was transferred to [facility] Supervisor office to discuss protocol for patient to obtain the records. Ms. Lodema Hong was out of the office at tine of call. Left voice message requesting return call. Attempted to reach Ms. Herzberg today with updates. Left voice message requesting return call,     Self-Care/Patient Goals: Take medications as prescribed   Attend all scheduled provider appointments Call pharmacy for medication refills 3-7 days in advance of running out of medications Call provider office for new concerns or questions  Work with the care team to address multiple SDOH needs Call the Suicide and Crisis Lifeline: 988 Call the [place] National Suicide Prevention Lifeline: (937)480-2133 or TTY:    743-386-5261 TTY 929 777 6143) to talk to a trained counselor Call 1-800-273-TALK (toll free, 24 hour hotline) Go to [facility] [address], [place] [phone]) Call 911 if experiencing a Mental Health or Behavioral Health Crisis           PLAN Pending return call from [facility] Simpson/Program Supervisor    Juanell Fairly [facility] Population Health RN Care Manager Direct Dial: [phone]  Fax: [phone] Website: [email or web address]

## 2023-03-30 ENCOUNTER — Other Ambulatory Visit: Payer: Medicare Other

## 2023-03-30 DIAGNOSIS — E876 Hypokalemia: Secondary | ICD-10-CM

## 2023-03-30 DIAGNOSIS — E785 Hyperlipidemia, unspecified: Secondary | ICD-10-CM

## 2023-03-30 DIAGNOSIS — Z9181 History of falling: Secondary | ICD-10-CM

## 2023-03-30 DIAGNOSIS — I10 Essential (primary) hypertension: Secondary | ICD-10-CM

## 2023-03-30 MED ORDER — ATORVASTATIN CALCIUM 20 MG PO TABS: 20.0000 mg | ORAL_TABLET | Freq: Every day | ORAL | 1 refills | Status: DC

## 2023-03-30 MED ORDER — HYDROCHLOROTHIAZIDE 12.5 MG PO TABS: 6.2500 mg | ORAL_TABLET | Freq: Every day | ORAL | 0 refills | Status: DC

## 2023-03-30 MED ORDER — POTASSIUM CHLORIDE CRYS ER 20 MEQ PO TBCR: 20.0000 meq | EXTENDED_RELEASE_TABLET | Freq: Every day | ORAL | 0 refills | Status: DC

## 2023-03-30 MED ORDER — VITAMIN D3 50 MCG (2000 UT) PO CAPS: 2000.0000 [IU] | ORAL_CAPSULE | Freq: Every day | ORAL | 1 refills | Status: AC

## 2023-03-30 NOTE — Progress Notes (Signed)
 03/30/2023 Name: Shannon [name] MRN: [id number] DOB: Feb 04, 1966  Chief Complaint  Patient presents with   Hypertension   Medication Management   Hyperlipidemia    Shannon [name] is a 58 y.o. year old female who presented for a telephone visit.   They were referred to the pharmacist by their PCP for assistance in managing hypertension, hyperlipidemia, and medication nonadherence .   Subjective:  Care Team: Primary Care Provider: Lendia Boby CROME, NP-C ; Next Scheduled Visit: not scheduled  Medication Access/Adherence  Current Pharmacy:  [facility] - [place], [place] - [address] [place] [address] Phone: [phone] Fax: [phone]  [id number] [place], [place] - [address] AT [facility] OF [facility] & [address] [place] [address] Phone: [phone] Fax: [phone]   Patient reports affordability concerns with their medications: Yes  Patient reports access/transportation concerns to their pharmacy: No  Patient reports adherence concerns with their medications:  Yes    Pt notes she is not currently taking hydrochlorothiazide  or atorvastatin  - was not sure if she was supposed to be taking these.  Pt has Medicaid, has had history of nonadherence related to social issues and cost of medications.  Hypertension:  Current medications: prescribed hydrochlorothiazide  12.5 mg 1/2 tablet daily but not currently taking She received 30 DS s/p December hospitalization and finished the Rx She did receive and finish potassium chloride  x1 week for recent low potassium lab result Medications previously tried: amlodipine , nebivolol    Hyperlipidemia/ASCVD Risk Reduction  Current lipid lowering medications: prescribed atorvastatin  20 mg daily however has not been taking Medications tried in the past: Ezetimibe /simvastatin    Diet: Pt notes she has lost about 80 lbs over years.  Does not eat fried foods, has watched her diet closely. She is disappointed her cholesterol is still high despite her efforts  The 10-year ASCVD risk score (Arnett DK, et al., 2019) is: 15%   Values used to calculate the score:     Age: 62 years     Sex: Female     Is Non-Hispanic African American: Yes     Diabetic: No     Tobacco smoker: Yes     Systolic Blood Pressure: 132 mmHg     Is BP treated: Yes     HDL Cholesterol: 47 mg/dL     Total Cholesterol: 213 mg/dL   Objective:   BP Readings from Last 3 Encounters:  03/11/23 132/74  03/10/23 132/74  03/02/23 (!) 148/80     Lab Results  Component Value Date   HGBA1C 5.5 02/09/2023    Lab Results  Component Value Date   CREATININE 0.72 03/10/2023   BUN 7 03/10/2023   NA 140 03/10/2023   K 3.1 (L) 03/10/2023   CL 102 03/10/2023   CO2 30 03/10/2023    Lab Results  Component Value Date   CHOL 213 (H) 02/09/2023   HDL 47 02/09/2023   LDLCALC 142 (H) 02/09/2023   LDLDIRECT 195.6 10/26/2012   TRIG 121 02/09/2023   CHOLHDL 4.5 02/09/2023    Medications Reviewed Today     Reviewed by Merceda Lela SAUNDERS, RPH (Pharmacist) on 03/30/23 at 1401  Med List Status: <None>   Medication Order Taking? Sig Documenting Provider Last Dose Status Informant  AMBULATORY NON FORMULARY MEDICATION [id number]  Walking cane R20.0 R20.2 Leonce Katz, DO  Active   atorvastatin  (LIPITOR) 20 MG tablet [id number] No TAKE ONE TABLET  BY MOUTH ONCE DAILY FOR CHOLESTEROL  Patient not taking: No sig reported   Lendia Boby CROME, NP-C Not Taking Active Self, Pharmacy Records           Med Note (RODRIGUEZ, CHANELLE D   Thu Jan 28, 2023  7:30 AM)    clonazePAM  (KLONOPIN ) 0.5 MG tablet [id number] Yes Take 1 tablet (0.5 mg total) by mouth 2 (two) times daily as needed for anxiety. Curry Leni DASEN, MD Taking Active   cyanocobalamin  1000 MCG tablet [id number]  Take 1 tablet (1,000 mcg total) by mouth daily. Regalado, Belkys A, MD  Active   FLUoxetine   (PROZAC ) 20 MG capsule [id number] Yes Take 1 capsule (20 mg total) by mouth daily. Arfeen, Syed T, MD Taking Active   gabapentin  (NEURONTIN ) 400 MG capsule [id number] Yes Take 2 capsules (800 mg total) by mouth 3 (three) times daily. Leonce Katz, DO Taking Active   hydrochlorothiazide  (HYDRODIURIL ) 12.5 MG tablet [id number] No Take 0.5 tablets (6.25 mg total) by mouth daily.  Patient not taking: Reported on 03/30/2023   Regalado, Belkys A, MD Not Taking Active   Multiple Vitamins-Minerals (BARIATRIC MULTIVITAMINS/IRON ) CAPS [id number]  Take 1 capsule by mouth daily. Lendia Boby CROME, NP-C  Active Self, Pharmacy Records           Med Note CARLEEN, CHANELLE D   Thu Jan 28, 2023  7:31 AM)    pantoprazole  (PROTONIX ) 40 MG tablet [id number] Yes Take 1 tablet (40 mg total) by mouth 2 (two) times daily before a meal. Henson, [name], NP-C Taking Active   QUEtiapine  (SEROQUEL ) 25 MG tablet [id number] Yes Take 1 tablet (25 mg total) by mouth 2 (two) times daily. Arfeen, Syed T, MD Taking Active   QUEtiapine  (SEROQUEL ) 400 MG tablet [id number] Yes Take 1 tablet (400 mg total) by mouth at bedtime. Curry Leni DASEN, MD Taking Active   thiamine  (VITAMIN B1) 100 MG tablet [id number]  Take 1 tablet (100 mg total) by mouth daily. Regalado, Belkys A, MD  Active   topiramate  (TOPAMAX ) 50 MG tablet [id number]  Take 50 mg by mouth 2 (two) times daily. [provider]  Active              Assessment/Plan:   Hypertension: - Currently borderline uncontrolled, BP goal <130/80 - Recommend to restart hydrochlorothiazide  12.5 mg 1/2 tablet daily. Recommend long term potassium while on hydrochlorothiazide  given hx of hypokalemia.    Hyperlipidemia/ASCVD Risk Reduction: - Currently uncontrolled. LDL goal <100 - Reviewed long term complications of uncontrolled cholesterol - Recommend to restart atorvastatin  20 mg daily   Hx of Vitamin D  deficiency: Pt notes hx of low Vit D. Recent Vit D in December was 31,  just above goal. She is high risk for vitamin D  deficiency as she only gets sun on her face when she does go outside as her attire covers her entire body otherwise for religious reasons. - Will send vitamin D  2000 units daily. Explained Medicaid does not usually cover Vitamin D  supplement but will try sending.   Follow Up Plan: 04/28/23  Darrelyn Drum, PharmD, BCPS, CPP Clinical Pharmacist Practitioner [facility] [phone]

## 2023-03-31 NOTE — Patient Instructions (Signed)
 It was a pleasure speaking with you today!  We have sent over prescriptions for hydrochlorothiazide  12.5 mg 1/2 tablet daily, atorvastatin  20 mg daily, potassium chloride  20 mEq daily, and vitamin D  2000 units daily.  Feel free to call with any questions or concerns!  Darrelyn Drum, PharmD, BCPS, CPP Clinical Pharmacist Practitioner [place] [facility] [phone]

## 2023-04-02 ENCOUNTER — Ambulatory Visit: Payer: Self-pay

## 2023-04-02 ENCOUNTER — Other Ambulatory Visit: Payer: Self-pay

## 2023-04-02 NOTE — Patient Outreach (Signed)
  Care Coordination   Follow Up Visit Note   04/02/2023 Name: Shannon [name] MRN: [id number] DOB: [birth year]  Shannon [name] is a 58 y.o. year old female who sees Shannon [name], [place], NP-C for primary care. I spoke with  Shannon [name] by phone today.  What matters to the patients health and wellness today?  Patient needs resources for furniture and clothing.    Goals Addressed             This Visit's Progress    Care Coordination Activities       Interventions Today    Flowsheet Row Most Recent Value  Chronic Disease   Chronic disease during today's visit Other  [Neuropathy]  General Interventions   General Interventions Discussed/Reviewed General Interventions Discussed, General Interventions Reviewed, Shannon [name] did not receive a reply from Shannon [name]. Shannon [name] and will call again.Pt received foodstamps and food was delivered today.Disability Advocated assisted with housing application and pt is on the waiting list.Pt will contact Shannon [name] for clothing.]              SDOH assessments and interventions completed:  No     Care Coordination Interventions:  Yes, provided   Follow up plan: Follow up call scheduled for 04/08/23 at 3pm.    Encounter Outcome:  Patient Visit Completed

## 2023-04-02 NOTE — Patient Outreach (Signed)
 Care Management   Visit Note  04/02/2023 Name: TAMMEE THIELKE MRN: [id number] DOB: 1966/02/09  Subjective: SKYLER DUSING is a 58 y.o. year old female who is a primary care patient of Lendia, [name], NP-C. The Care Management team was consulted for assistance.      Engaged with Ms. Sick via telephone  Assessment:  Review of patient past medical history, allergies, medications, health status, including review of consultants reports, laboratory and other test data, was performed as part of  evaluation and provision of care management services.    Outpatient Encounter Medications as of 04/02/2023  Medication Sig   AMBULATORY NON FORMULARY MEDICATION Walking cane R20.0 R20.2   atorvastatin  (LIPITOR) 20 MG tablet Take 1 tablet (20 mg total) by mouth daily.   Cholecalciferol  (VITAMIN D3) 50 MCG (2000 UT) capsule Take 1 capsule (2,000 Units total) by mouth daily.   clonazePAM  (KLONOPIN ) 0.5 MG tablet Take 1 tablet (0.5 mg total) by mouth 2 (two) times daily as needed for anxiety.   cyanocobalamin  1000 MCG tablet Take 1 tablet (1,000 mcg total) by mouth daily.   FLUoxetine  (PROZAC ) 20 MG capsule Take 1 capsule (20 mg total) by mouth daily.   gabapentin  (NEURONTIN ) 400 MG capsule Take 2 capsules (800 mg total) by mouth 3 (three) times daily.   hydrochlorothiazide  (HYDRODIURIL ) 12.5 MG tablet Take 0.5 tablets (6.25 mg total) by mouth daily.   Multiple Vitamins-Minerals (BARIATRIC MULTIVITAMINS/IRON ) CAPS Take 1 capsule by mouth daily.   pantoprazole  (PROTONIX ) 40 MG tablet Take 1 tablet (40 mg total) by mouth 2 (two) times daily before a meal.   potassium chloride  SA (KLOR-CON  M) 20 MEQ tablet Take 1 tablet (20 mEq total) by mouth daily.   QUEtiapine  (SEROQUEL ) 25 MG tablet Take 1 tablet (25 mg total) by mouth 2 (two) times daily.   QUEtiapine  (SEROQUEL ) 400 MG tablet Take 1 tablet (400 mg total) by mouth at bedtime.   thiamine  (VITAMIN B1) 100 MG tablet Take 1 tablet (100 mg total) by  mouth daily.   topiramate  (TOPAMAX ) 50 MG tablet Take 50 mg by mouth 2 (two) times daily.   No facility-administered encounter medications on file as of 04/02/2023.    Interventions:   Goals Addressed             This Visit's Progress    Monitor, Self-Manage, and Reduce Symptoms of Hypertension       Current Barriers:  Care Management support and education needs related to Hypertension  Planned Interventions: Reviewed current treatment plan related to Hypertension, self-management, and adherence to plan as established by provider.  Reviewed medications and indications for use. Reports taking medications as prescribed. Hydrochlorothiazide  was recently ordered along with 20meq of Klor-Con  20 meq. She expressed concerns regarding the 6.25 mg dose of hydrochlorothiazide . She reports difficulty dividing the 12.5 mg tablet d/t decrease vision and the small pill size. Will follow up with Pharmacy to determine if pills can be divided prior to being delivered. She is working with the [facility] Pharmacist regarding medication management. Provided information regarding established blood pressure parameters along with indications for notifying a provider. Recommended obtaining automated, upper arm blood pressure monitor to check home blood pressure and heart rate daily. Advised to record readings.  Reviewed symptoms. Denies chest pain or palpitations. Denies headaches or acute visual changes. She continues to experience exertional dyspnea, occasional episodes of dizziness, and fatigue which is her baseline.  Reviewed safety and fall prevention measures. Advised to continue using walker if needed for  ambulation. Discussed compliance with recommended cardiac prudent diet. Encouraged to read nutrition labels, continue monitoring sodium intake, and avoid highly processed foods when possible.  Reviewed s/sx of heart attack, stroke and worsening symptoms that require immediate medical attention.   BP Readings from  Last 3 Encounters:  03/11/23 132/74  03/10/23 132/74  02/04/23 128/74     Symptom Management: Take all medications as prescribed Attend all scheduled provider appointments Call pharmacy for medication refills 3-7 days in advance of running out of medications Check blood pressure and keep a log Call doctor for signs and symptoms of high blood pressure Follow recommended safety and fall prevention measures Read nutrition labels. Eat whole grains, fruits and vegetables, lean meats and healthy fats Limit salt intake  Call provider office for new concerns or questions              PLAN Will follow up next week    [facility] RN Care Manager Direct Dial: [phone]  Fax: [phone] Website: [email or web address]

## 2023-04-02 NOTE — Patient Instructions (Signed)
 Visit Information  Thank you for taking time to visit with me today. Please don't hesitate to contact me if I can be of assistance to you.   Following are the goals we discussed today:  Patient will call [employer] [facility] again for clothing and furniture.   Our next appointment is by telephone on 04/08/23 at 3pm  Please call the care guide team at [phone] if you need to cancel or reschedule your appointment.   If you are experiencing a Mental Health or Behavioral Health Crisis or need someone to talk to, please call 911  Patient verbalizes understanding of instructions and care plan provided today and agrees to view in MyChart. Active MyChart status and patient understanding of how to access instructions and care plan via MyChart confirmed with patient.     Telephone follow up appointment with care management team member scheduled for:04/08/23 3pm   Tillman Gardener, BSW [place]  [facility], [facility] Social Worker Direct Dial: [phone]  Fax: [phone] Website: [email or web address]

## 2023-04-06 ENCOUNTER — Other Ambulatory Visit: Payer: Self-pay | Admitting: Psychiatry

## 2023-04-08 ENCOUNTER — Telehealth (HOSPITAL_BASED_OUTPATIENT_CLINIC_OR_DEPARTMENT_OTHER): Payer: Medicare Other | Admitting: Psychiatry

## 2023-04-08 ENCOUNTER — Encounter (HOSPITAL_COMMUNITY): Payer: Self-pay | Admitting: Psychiatry

## 2023-04-08 ENCOUNTER — Ambulatory Visit: Payer: Self-pay

## 2023-04-08 VITALS — Wt 201.0 lb

## 2023-04-08 DIAGNOSIS — F4310 Post-traumatic stress disorder, unspecified: Secondary | ICD-10-CM

## 2023-04-08 DIAGNOSIS — F319 Bipolar disorder, unspecified: Secondary | ICD-10-CM | POA: Diagnosis not present

## 2023-04-08 DIAGNOSIS — F411 Generalized anxiety disorder: Secondary | ICD-10-CM | POA: Diagnosis not present

## 2023-04-08 MED ORDER — FLUOXETINE HCL 20 MG PO CAPS: 20.0000 mg | ORAL_CAPSULE | Freq: Every day | ORAL | 2 refills | Status: DC

## 2023-04-08 MED ORDER — QUETIAPINE FUMARATE 400 MG PO TABS: 400.0000 mg | ORAL_TABLET | Freq: Every day | ORAL | 2 refills | Status: DC

## 2023-04-08 MED ORDER — CLONAZEPAM 0.5 MG PO TABS: 0.5000 mg | ORAL_TABLET | Freq: Two times a day (BID) | ORAL | 2 refills | Status: DC | PRN

## 2023-04-08 NOTE — Progress Notes (Signed)
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 obtained prior to any record release in order to collaborate their care with an outside provider. Patient/Guardian  was advised if they have not already done so to contact the registration department to sign all necessary forms in order for [name] to release information regarding their care.   Consent: Patient/Guardian gives verbal consent for treatment and assignment of benefits for services provided during this visit. Patient/Guardian expressed understanding and agreed to proceed.     I provided 24 minutes of non face to face time during this encounter.  Note: This document was prepared by [employer] voice dictation technology and any errors that results  from this process are unintentional.    Cleotis Nipper, MD 04/08/2023

## 2023-04-08 NOTE — Patient Outreach (Signed)
  Care Coordination   Follow Up Visit Note   04/08/2023 Name: Shannon [name] MRN: [id number] DOB: 05-Jan-1966  Shannon [name] is a 58 y.o. year old female who sees [place], [place], NP-C for primary care. I spoke with  [name] by phone today.  What matters to the patients health and wellness today?  Patient needs furniture and clothing.  No other unmet need.    Goals Addressed             This Visit's Progress    Care Coordination Activities       Interventions Today    Flowsheet Row Most Recent Value  Chronic Disease   Chronic disease during today's visit Other  [leg weakness]  General Interventions   General Interventions Discussed/Reviewed General Interventions Discussed, General Interventions Reviewed  [Pt reports [facility] no longer provides furniture assistance.Pt will speak to a friend for additional items. SW suggested contacting [employer] for additional items.]              SDOH assessments and interventions completed:  No     Care Coordination Interventions:  Yes, provided   Follow up plan: No further intervention required.   Encounter Outcome:  Patient Visit Completed

## 2023-04-08 NOTE — Patient Instructions (Signed)
Visit Information  Thank you for taking time to visit with me today. Please don't hesitate to contact me if I can be of assistance to you.   Following are the goals we discussed today:  Patient will contact [employer] for additional assistance.    If you are experiencing a Mental Health or Behavioral Health Crisis or need someone to talk to, please call 911  Patient verbalizes understanding of instructions and care plan provided today and agrees to view in MyChart. Active MyChart status and patient understanding of how to access instructions and care plan via MyChart confirmed with patient.     No further follow up required: Patient does not request additional follow up. Lysle Morales, BSW   [facility], [facility] Social Worker Direct Dial: [phone]  Fax: [phone] Website: [email or web address]

## 2023-04-09 ENCOUNTER — Other Ambulatory Visit: Payer: Self-pay

## 2023-04-09 NOTE — Patient Outreach (Signed)
Care Management   Visit Note  04/09/2023 Name: Shannon [name] MRN: [id number] DOB: 1965-10-05  Subjective: Shannon [name] is a 58 y.o. year old female who is a primary care patient of Shannon [name], Shannon L, NP-C. The Care Management team was consulted for assistance.      Engaged with Shannon [name] via telephone.  Assessment:  Review of patient past medical history, allergies, medications, health status, including review of consultants reports, laboratory and other test data, was performed as part of  evaluation and provision of care management services.    Outpatient Encounter Medications as of 04/09/2023  Medication Sig   AMBULATORY NON FORMULARY MEDICATION Walking cane R20.0 R20.2   atorvastatin (LIPITOR) 20 MG tablet Take 1 tablet (20 mg total) by mouth daily.   Cholecalciferol (VITAMIN D3) 50 MCG (2000 UT) capsule Take 1 capsule (2,000 Units total) by mouth daily.   clonazePAM (KLONOPIN) 0.5 MG tablet Take 1 tablet (0.5 mg total) by mouth 2 (two) times daily as needed for anxiety.   cyanocobalamin 1000 MCG tablet Take 1 tablet (1,000 mcg total) by mouth daily.   FLUoxetine (PROZAC) 20 MG capsule Take 1 capsule (20 mg total) by mouth daily.   gabapentin (NEURONTIN) 400 MG capsule Take 2 capsules (800 mg total) by mouth 3 (three) times daily.   hydrochlorothiazide (HYDRODIURIL) 12.5 MG tablet Take 0.5 tablets (6.25 mg total) by mouth daily.   Multiple Vitamins-Minerals (BARIATRIC MULTIVITAMINS/IRON) CAPS Take 1 capsule by mouth daily.   pantoprazole (PROTONIX) 40 MG tablet Take 1 tablet (40 mg total) by mouth 2 (two) times daily before a meal.   potassium chloride SA (KLOR-CON M) 20 MEQ tablet Take 1 tablet (20 mEq total) by mouth daily.   QUEtiapine (SEROQUEL) 25 MG tablet Take 1 tablet (25 mg total) by mouth 2 (two) times daily. (Patient not taking: Reported on 04/08/2023)   QUEtiapine (SEROQUEL) 400 MG tablet Take 1 tablet (400 mg total) by mouth at bedtime.   thiamine (VITAMIN B1)  100 MG tablet Take 1 tablet (100 mg total) by mouth daily.   topiramate (TOPAMAX) 50 MG tablet Take 50 mg by mouth 2 (two) times daily.   No facility-administered encounter medications on file as of 04/09/2023.    Interventions:   Goals       Risks for Falls      Current Barriers:  Care Management support and education needs related to Fall Risks Lacks caregiver support.  Limited Mobility  Planned Interventions: Provided verbal education regarding potential causes of falls and fall prevention strategies. History of multiple falls. Denies falls within the last month. Reviewed medications and discussed potential side effects of medications such as dizziness and frequent urination. Hydrochlorothiazide was recently added back to her regimen for hypertension management. Discussed effects of diuretic use. She will attempt to take this medication earlier in the day to decrease risks of accidental falls at night due to frequent urination. Discussed importance of avoiding sudden movements/change in position and allowing time to adjust when transferring from sitting to standing positions. Advised to be especially mindful in the bathroom. Advised to use walker with ambulation. Discussed plan for follow up with the [facility] team. She was evaluated by Dr. Jean Rosenthal on 03/11/23. Reports receiving confirmation in the mail regarding approval to start outpatient therapy in March. Discussed importance of keeping the care team updated of changes or decline in functional status. Discussed importance of notifying provider after a fall.    Symptom Management: Attend all scheduled provider appointments  Take medications as prescribed   Call pharmacy for medication refills 3-7 days in advance of running out of medications Perform all self-care activities independently as tolerated Follow recommended safety and fall prevention measures  Use assistive device with all ambulation Call provider office  for new concerns or questions  Work with the provider and care team to address health care and care coordination needs If experiencing a Mental Health or Behavioral Health Crisis  Call the Suicide and Crisis Lifeline: 988 Call the [place] National Suicide Prevention Lifeline: 570-399-2584 or TTY: 602-175-4689 TTY 506-742-6500) to talk to a trained counselor Call 1-800-273-TALK (toll free, 24 hour hotline) Go to [facility] [address], [place] [id number]) Call 911          PLAN Will follow up next week    Juanell Fairly [facility] RN Care Manager Direct Dial: [phone]  Fax: [phone] Website: [email or web address]

## 2023-04-15 ENCOUNTER — Other Ambulatory Visit: Payer: Self-pay

## 2023-04-15 NOTE — Patient Outreach (Signed)
  Care Management   Visit Note  04/15/2023 Name: Shannon [name] MRN: [id number] DOB: 01-14-1966  Subjective: Shannon [name] is a 58 y.o. year old female who is a primary care patient of Shannon [name], Shannon L, NP-C. The Care Management team was consulted for assistance.      Engaged with Shannon [name] via telephone.  Assessment:  Review of patient past medical history, allergies, medications, health status, including review of consultants reports, laboratory and other test data, was performed as part of  evaluation and provision of care management services.   Interventions:   Goals Addressed             This Visit's Progress    Request for Furniture       Interventions Today    Flowsheet Row Most Recent Value  General Interventions   General Interventions Discussed/Reviewed Communication with, General Interventions Discussed, General Interventions Reviewed, Community Resources  [facility] communicated with assigned Shannon [name]. Patient is requiring assistance with obtaining a mattress and bed. Reports not being able to obtain assistance with the [employer]. Reports previously using an air mattress which is now leaking frequently.]  Communication with Social Work  Safety Interventions   Safety Discussed/Reviewed Home Safety, Fall Risk, Safety Reviewed               PLAN Message assigned Shannon [name] via staff messaging regarding patient's request for donated furniture. Will follow up with patient next month.    [name] Population [facility] RN Care Manager Direct Dial: [phone]  Fax: [phone] Website: [email or web address]

## 2023-04-23 ENCOUNTER — Other Ambulatory Visit: Payer: Self-pay

## 2023-04-23 NOTE — Patient Instructions (Addendum)
 Thank you for allowing the  Care Management team to participate in your care. It was great speaking with you today.   We will follow up on April 30, 2023 at 1:30 pm.  Please do not hesitate to contact a member of the care team if you require assistance prior to our next outreach.   Juanell Fairly [facility] Population Health RN Care Manager Direct Dial: [phone]  Fax: [phone] Website: [email or web address]

## 2023-04-23 NOTE — Patient Outreach (Signed)
 Care Management   Visit Note  04/23/2023 Name: KAMRYNN MELOTT MRN: [id number] DOB: 12/31/1965  Subjective: Shannon [name] is a 58 y.o. year old female who is a primary care patient of Suezanne Jacquet, [name], NP-C. Engaged with Ms. Lopresti via telephone.   Assessment:  Review of patient past medical history, allergies, medications, health status, including review of consultants reports, laboratory and other test data, was performed as part of  evaluation and provision of care management services.    Outpatient Encounter Medications as of 04/23/2023  Medication Sig   AMBULATORY NON FORMULARY MEDICATION Walking cane R20.0 R20.2   atorvastatin (LIPITOR) 20 MG tablet Take 1 tablet (20 mg total) by mouth daily.   Cholecalciferol (VITAMIN D3) 50 MCG (2000 UT) capsule Take 1 capsule (2,000 Units total) by mouth daily.   clonazePAM (KLONOPIN) 0.5 MG tablet Take 1 tablet (0.5 mg total) by mouth 2 (two) times daily as needed for anxiety.   cyanocobalamin 1000 MCG tablet Take 1 tablet (1,000 mcg total) by mouth daily.   FLUoxetine (PROZAC) 20 MG capsule Take 1 capsule (20 mg total) by mouth daily.   gabapentin (NEURONTIN) 400 MG capsule Take 2 capsules (800 mg total) by mouth 3 (three) times daily.   hydrochlorothiazide (HYDRODIURIL) 12.5 MG tablet Take 0.5 tablets (6.25 mg total) by mouth daily.   Multiple Vitamins-Minerals (BARIATRIC MULTIVITAMINS/IRON) CAPS Take 1 capsule by mouth daily.   pantoprazole (PROTONIX) 40 MG tablet Take 1 tablet (40 mg total) by mouth 2 (two) times daily before a meal.   potassium chloride SA (KLOR-CON M) 20 MEQ tablet Take 1 tablet (20 mEq total) by mouth daily.   QUEtiapine (SEROQUEL) 25 MG tablet Take 1 tablet (25 mg total) by mouth 2 (two) times daily. (Patient not taking: Reported on 04/08/2023)   QUEtiapine (SEROQUEL) 400 MG tablet Take 1 tablet (400 mg total) by mouth at bedtime.   thiamine (VITAMIN B1) 100 MG tablet Take 1 tablet (100 mg total) by mouth daily.    topiramate (TOPAMAX) 50 MG tablet Take 50 mg by mouth 2 (two) times daily.   No facility-administered encounter medications on file as of 04/23/2023.     Interventions:  Interventions Today    Flowsheet Row Most Recent Value  Chronic Disease   Chronic disease during today's visit Other  [Peripheral Neuropathy]  General Interventions   General Interventions Discussed/Reviewed General Interventions Discussed, General Interventions Reviewed, Doctor Visits, Communication with  [Ms. Bautch reports receiving a letter indicating transportation was not approved due to lack of information. Communicated with staff/[place] [employer] [place] Team reports the full application (Part A and B) was not received.]  Doctor Visits Discussed/Reviewed Doctor Visits Reviewed, Specialist  PCP/Specialist Visits Compliance with follow-up visit  [Discussed plan to start Physical Therapy and follow up with the Ortho team]  Exercise Interventions   Exercise Discussed/Reviewed Assistive device use and maintanence, Physical Activity  Physical Activity Discussed/Reviewed Physical Activity Reviewed  [Discussed plan to start outpatient physical therapy. Sessions pending availability of transportation]  Education Interventions   Education Provided Provided Education  Provided Verbal Education On Nutrition, Medication, When to see the doctor, Community Resources  Mental Health Interventions   Mental Health Discussed/Reviewed Coping Strategies  Nutrition Interventions   Nutrition Discussed/Reviewed Nutrition Reviewed  Pharmacy Interventions   Pharmacy Dicussed/Reviewed Medications and their functions  Safety Interventions   Safety Discussed/Reviewed Safety Reviewed, Fall Risk       PLAN Ms. Agan agreed to follow up next week.    Marri Mcneff  [facility] RN Care Manager Direct Dial: [phone]  Fax: [phone] Website: [email or web address]

## 2023-04-27 ENCOUNTER — Ambulatory Visit: Payer: Self-pay

## 2023-04-27 NOTE — Patient Instructions (Signed)
 Visit Information  Thank you for taking time to visit with me today. Please don't hesitate to contact me if I can be of assistance to you.   Following are the goals we discussed today:  Patient will await update from [employer] on provider Part B form.   Our next appointment is by telephone on 05/04/23 at 10am  Please call the care guide team at [phone] if you need to cancel or reschedule your appointment.   If you are experiencing a Mental Health or Behavioral Health Crisis or need someone to talk to, please call 911  Patient verbalizes understanding of instructions and care plan provided today and agrees to view in MyChart. Active MyChart status and patient understanding of how to access instructions and care plan via MyChart confirmed with patient.     Telephone follow up appointment with care management team member scheduled for:05/04/23 at 10am   Lysle Morales, BSW [place]  [facility], [facility] Social Worker Direct Dial: [phone]  Fax: [phone] Website: [email or web address]

## 2023-04-27 NOTE — Patient Outreach (Signed)
 Care Coordination   Follow Up Visit Note   04/27/2023 Name: Shannon [name] MRN: [id number] DOB: December 29, 1965  Shannon [name] is a 58 y.o. year old female who sees [place], [place], NP-C for primary care. I spoke with  [name] by phone today.  What matters to the patients health and wellness today?  Patient needs transportation.    Goals Addressed             This Visit's Progress    Care Coordination Activities       Interventions Today    Flowsheet Row Most Recent Value  Chronic Disease   Chronic disease during today's visit Other  [leg weakness]  General Interventions   General Interventions Discussed/Reviewed General Interventions Discussed, General Interventions Reviewed, [employer], Communication with  [Pt SCAT application is incomplete.Provider form does not reflect walker use.Pt ordered a twin mattress and friend provided frame/boxspring/clothing.SW will contact MD to follow up on SCAT application.]  Communication with PCP/Specialists  [SW forward message to provider for update on SCAT form to determine if any changes are appropriate.]              SDOH assessments and interventions completed:  No     Care Coordination Interventions:  Yes, provided   Follow up plan: Follow up call scheduled for 05/04/23 at 10am.    Encounter Outcome:  Patient Visit Completed

## 2023-04-28 ENCOUNTER — Other Ambulatory Visit: Payer: Medicare Other | Admitting: Pharmacist

## 2023-04-28 DIAGNOSIS — I10 Essential (primary) hypertension: Secondary | ICD-10-CM

## 2023-04-28 DIAGNOSIS — E785 Hyperlipidemia, unspecified: Secondary | ICD-10-CM

## 2023-04-28 MED ORDER — HYDROCHLOROTHIAZIDE 12.5 MG PO TABS
12.5000 mg | ORAL_TABLET | Freq: Every day | ORAL | 1 refills | Status: DC
Start: 2023-04-28 — End: 2023-05-26

## 2023-04-28 NOTE — Progress Notes (Signed)
 04/28/2023 Name: Shannon [name] MRN: [id number] DOB: April 21, 1965  Chief Complaint  Patient presents with   Medication Adherence    Shannon [name] is a 58 y.o. year old female who presented for a telephone visit.   They were referred to the pharmacist by their PCP for assistance in managing hypertension, hyperlipidemia, and medication nonadherence .   Subjective:  Care Team: Primary Care Provider: Avanell Shackleton, NP-C ; Next Scheduled Visit: not scheduled  Medication Access/Adherence  Current Pharmacy:  [facility] - [place], [place] - [address] [place] [address] Phone: [phone] Fax: [phone]  [id number] [place], [place] - [address] AT [facility] & [address] [place] [address] Phone: [phone] Fax: [phone]   Patient reports affordability concerns with their medications: Yes  Patient reports access/transportation concerns to their pharmacy: No  Patient reports adherence concerns with their medications:  Yes    Pt has Medicaid, has had history of nonadherence related to social issues and cost of medications.  Pt notes she is overwhelmed with keeping up with all her medications. She feels that she is on a lot of medication. It is unclear how/if she is currently taking her medications.  Hypertension:  Current medications: hydrochlorothiazide 12.5 mg 1/2 tablet daily  Medications previously tried: amlodipine, nebivolol  She notes the hydrochlorothiazide tab is very small and she is unable to cut in half so she often will just take the whole tablet  Hyperlipidemia/ASCVD Risk Reduction  Current lipid lowering medications: atorvastatin 20 mg daily  Medications tried in the past: Ezetimibe/simvastatin   Diet: Pt notes she has lost about 80 lbs over years. Does not eat fried foods, has watched her diet closely. She is disappointed her  cholesterol is still high despite her efforts  The 10-year ASCVD risk score (Arnett DK, et al., 2019) is: 15%   Values used to calculate the score:     Age: 31 years     Sex: Female     Is Non-Hispanic African American: Yes     Diabetic: No     Tobacco smoker: Yes     Systolic Blood Pressure: 132 mmHg     Is BP treated: Yes     HDL Cholesterol: 47 mg/dL     Total Cholesterol: 213 mg/dL   Objective:   BP Readings from Last 3 Encounters:  03/11/23 132/74  03/10/23 132/74  03/02/23 (!) 148/80     Lab Results  Component Value Date   HGBA1C 5.5 02/09/2023    Lab Results  Component Value Date   CREATININE 0.72 03/10/2023   BUN 7 03/10/2023   NA 140 03/10/2023   K 3.1 (L) 03/10/2023   CL 102 03/10/2023   CO2 30 03/10/2023    Lab Results  Component Value Date   CHOL 213 (H) 02/09/2023   HDL 47 02/09/2023   LDLCALC 142 (H) 02/09/2023   LDLDIRECT 195.6 10/26/2012   TRIG 121 02/09/2023   CHOLHDL 4.5 02/09/2023    Medications Reviewed Today   Medications were not reviewed in this encounter      Assessment/Plan:   Medication adherence: - Will contact [facility] to look into transitioning to packaging - Faxed med list with time of day she takes med for them to start transition to packaging   Hypertension: - Currently borderline uncontrolled, BP goal <130/80 - Recommend to change hydrochlorothiazide to 1 tablet  daily   Hyperlipidemia/ASCVD Risk Reduction: - Currently uncontrolled. LDL goal <100 - Reviewed long term complications of uncontrolled cholesterol    Follow Up Plan: 3/10 f/u with pt regarding packaging  Shannon [name], PharmD, BCPS, CPP Clinical Pharmacist Practitioner [facility] [phone]

## 2023-04-28 NOTE — Patient Instructions (Signed)
 It was a pleasure speaking with you today!  I have been in contact with [facility] and sent your medication list to them to start switching you to packaging. Please let me know if you do not hear from them.  Feel free to call with any questions or concerns!  Arbutus Leas, PharmD, BCPS, CPP Clinical Pharmacist Practitioner [facility] at [facility] [phone]

## 2023-04-30 ENCOUNTER — Other Ambulatory Visit: Payer: Self-pay

## 2023-04-30 NOTE — Patient Outreach (Signed)
 Care Management   Visit Note  04/30/2023 Name: Shannon [name] MRN: [id number] DOB: 09-Dec-1965  Subjective: Shannon [name] is a 58 y.o. year old female who is a primary care patient of Suezanne Jacquet, [name], NP-C. Engaged with Ms. Pho via telephone today.  Assessment:  Review of patient past medical history, allergies, medications, health status, including review of consultants reports, laboratory and other test data, was performed as part of  evaluation and provision of care management services.   Outpatient Encounter Medications as of 04/30/2023  Medication Sig Note   AMBULATORY NON FORMULARY MEDICATION Walking cane R20.0 R20.2    atorvastatin (LIPITOR) 20 MG tablet Take 1 tablet (20 mg total) by mouth daily.    Cholecalciferol (VITAMIN D3) 50 MCG (2000 UT) capsule Take 1 capsule (2,000 Units total) by mouth daily.    clonazePAM (KLONOPIN) 0.5 MG tablet Take 1 tablet (0.5 mg total) by mouth 2 (two) times daily as needed for anxiety. 04/30/2023: Reports not taking due to being out of this medication for several days: Will notify pharmacy today   cyanocobalamin 1000 MCG tablet Take 1 tablet (1,000 mcg total) by mouth daily.    FLUoxetine (PROZAC) 20 MG capsule Take 1 capsule (20 mg total) by mouth daily. 04/30/2023: Reports being out of this medication for several days. Will contact pharmacy today   gabapentin (NEURONTIN) 400 MG capsule Take 2 capsules (800 mg total) by mouth 3 (three) times daily.    hydrochlorothiazide (HYDRODIURIL) 12.5 MG tablet Take 1 tablet (12.5 mg total) by mouth daily.    Multiple Vitamins-Minerals (BARIATRIC MULTIVITAMINS/IRON) CAPS Take 1 capsule by mouth daily.    pantoprazole (PROTONIX) 40 MG tablet Take 1 tablet (40 mg total) by mouth 2 (two) times daily before a meal.    potassium chloride SA (KLOR-CON M) 20 MEQ tablet Take 1 tablet (20 mEq total) by mouth daily.    QUEtiapine (SEROQUEL) 25 MG tablet Take 1 tablet (25 mg total) by mouth 2 (two) times daily.  (Patient not taking: Reported on 04/08/2023)    QUEtiapine (SEROQUEL) 400 MG tablet Take 1 tablet (400 mg total) by mouth at bedtime.    thiamine (VITAMIN B1) 100 MG tablet Take 1 tablet (100 mg total) by mouth daily.    topiramate (TOPAMAX) 50 MG tablet Take 50 mg by mouth 2 (two) times daily.    No facility-administered encounter medications on file as of 04/30/2023.    Interventions:  Interventions Today    Flowsheet Row Most Recent Value  Chronic Disease   Chronic disease during today's visit Other  [Peripheral Neuropathy]  General Interventions   General Interventions Discussed/Reviewed General Interventions Reviewed, Holiday representative Visits Discussed/Reviewed Specialist  [Discussed plan regarding start of Physical Therapy services]  PCP/Specialist Visits Compliance with follow-up visit  [Patient is currently working with Child psychotherapist to discuss options forr transportation]  Exercise Interventions   Exercise Discussed/Reviewed Assistive device use and maintanence  Physical Activity Discussed/Reviewed Physical Activity Reviewed  Education Interventions   Education Provided Provided Education  Provided Verbal Education On Nutrition, Mental Health/Coping with Illness, Medication, When to see the doctor, [employer]  Mental Health Interventions   Mental Health Discussed/Reviewed Coping Strategies, Mental Health Reviewed  Nutrition Interventions   Nutrition Discussed/Reviewed Nutrition Reviewed  Pharmacy Interventions   Pharmacy Dicussed/Reviewed Medications and their functions, Medication Adherence  [Patient pending receipt of medications and adherence packaging. Confirmed speaking with clinic pharmacist on March 5th.]  Medication Adherence --  [Patient unable to self manage medications.  Clinic Pharmacist has arranged aherence packaging.]  Safety Interventions   Safety Discussed/Reviewed Safety Reviewed       PLAN Ms. Segers agreed to follow up within the next  two weeks    Juanell Fairly [facility] RN Care Manager Direct Dial: [phone]  Fax: [phone] Website: [email or web address]

## 2023-04-30 NOTE — Patient Instructions (Addendum)
 Thank you for allowing the Care Management team to participate in your care. It was great speaking with you.   Reminders: Please be sure to contact the pharmacy if your medications are not delivered. Please be sure to update the clinic pharmacist if your medications are not received via adherence packaging.   We will follow up on May 14, 2023 at 3:30 pm. Please do not hesitate to contact me if you require assistance prior to our next outreach.    Juanell Fairly [facility] RN Care Manager Direct Dial: [phone]  Fax: [phone] Website: [email or web address]

## 2023-05-04 ENCOUNTER — Ambulatory Visit: Payer: Self-pay

## 2023-05-04 NOTE — Patient Instructions (Signed)
 Visit Information  Thank you for taking time to visit with me today. Please don't hesitate to contact me if I can be of assistance to you.   Following are the goals we discussed today:  Patient will await update from [facility].    If you are experiencing a Mental Health or Behavioral Health Crisis or need someone to talk to, please call 911  Patient verbalizes understanding of instructions and care plan provided today and agrees to view in MyChart. Active MyChart status and patient understanding of how to access instructions and care plan via MyChart confirmed with patient.     Social Worker will follow up once [facility] forms are resubmitted.  Lysle Morales, BSW [place]  [facility], [facility] Social Worker Direct Dial: [phone]  Fax: [phone] Website: [email or web address]

## 2023-05-04 NOTE — Patient Outreach (Signed)
 Care Coordination   Follow Up Visit Note   05/04/2023 Name: Shannon [name] MRN: [id number] DOB: 01-09-1966  Shannon [name] is a 58 y.o. year old female who sees [place], [place], NP-C for primary care. I spoke with  [name] by phone today.  What matters to the patients health and wellness today?  Patient needs transportation.      Goals Addressed             This Visit's Progress    Care Coordination Activities       Interventions Today    Flowsheet Row Most Recent Value  Chronic Disease   Chronic disease during today's visit Other  General Interventions   General Interventions Discussed/Reviewed General Interventions Discussed, General Interventions Reviewed, Communication with  [SW received letter from [facility] that diagnosis in not included.SW t/c pt to explain the delay.Pt reports increased pain.]  Communication with --  [SW t/c [facility] & informed of corrections needed for form. SW messaged provider on the needed corrections.]              SDOH assessments and interventions completed:  No     Care Coordination Interventions:  Yes, provided   Follow up plan:  Social Worker will follow up with patient once [facility] forms are resubmitted.    Encounter Outcome:  Patient Visit Completed

## 2023-05-10 ENCOUNTER — Ambulatory Visit: Payer: Medicare Other | Admitting: Neurology

## 2023-05-10 NOTE — Progress Notes (Deleted)
 Follow-up Visit   Date: 05/10/2023    Shannon [name] MRN: [id number] DOB: [birth year]    Shannon [name] is a 58 y.o. right-handed female with bipolar disorder, GERD, hyperlipidemia, hypertension, lumbar disc disease, peptic ulcer disease, and GAD  returning to the clinic for follow-up of ***.  The patient was accompanied to the clinic by *** who also provides collateral information.    IMPRESSION/PLAN: ***  Return to clinic in ***  --------------------------------------------- History of present illness: Starting around August 2023, she began having sharp stabbing pain in the toes, which extends into the legs.  She also complains of stabbing pain in the hands.  She has numbness of the hands and feet.She was given gabapentin 100mg  TID< which she stopped due to no benefit.   She has gradually been getting weaker in the legs and using a walker since November because of leg weakness and falls. She has muscle twitches in the legs, no cramps.  She needs assistance to climb steps. Prior to November, she was walking unassisted. Hands are also weak, especially with grip.  She denies neck pain.  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   She is not diabetes, does not drink alcohol. She has been on disability since 1998 for bipolar disorder.  UPDATE ***:  Medications:  Current Outpatient Medications on File Prior to Visit  Medication Sig Dispense Refill   AMBULATORY NON FORMULARY MEDICATION Walking cane R20.0 R20.2 1 Units 0   atorvastatin (LIPITOR) 20 MG tablet Take 1 tablet (20 mg total) by mouth daily. 90 tablet 1   Cholecalciferol (VITAMIN D3) 50 MCG (2000 UT) capsule Take 1 capsule (2,000 Units total) by mouth daily. 90 capsule 1   clonazePAM (KLONOPIN) 0.5 MG tablet Take 1 tablet (0.5 mg total) by mouth 2 (two) times daily as needed for anxiety. 60 tablet 2   cyanocobalamin 1000 MCG tablet Take 1  tablet (1,000 mcg total) by mouth daily. 30 tablet 0   FLUoxetine (PROZAC) 20 MG capsule Take 1 capsule (20 mg total) by mouth daily. 30 capsule 2   gabapentin (NEURONTIN) 400 MG capsule Take 2 capsules (800 mg total) by mouth 3 (three) times daily. 180 capsule 1   hydrochlorothiazide (HYDRODIURIL) 12.5 MG tablet Take 1 tablet (12.5 mg total) by mouth daily. 30 tablet 1   Multiple Vitamins-Minerals (BARIATRIC MULTIVITAMINS/IRON) CAPS Take 1 capsule by mouth daily. 30 capsule 2   pantoprazole (PROTONIX) 40 MG tablet Take 1 tablet (40 mg total) by mouth 2 (two) times daily before a meal. 180 tablet 1   potassium chloride SA (KLOR-CON M) 20 MEQ tablet Take 1 tablet (20 mEq total) by mouth daily. 90 tablet 0   QUEtiapine (SEROQUEL) 25 MG tablet Take 1 tablet (25 mg total) by mouth 2 (two) times daily. (Patient not taking: Reported on 04/08/2023) 60 tablet 1   QUEtiapine (SEROQUEL) 400 MG tablet Take 1 tablet (400 mg total) by mouth at bedtime. 30 tablet 2   thiamine (VITAMIN B1) 100 MG tablet Take 1 tablet (100 mg total) by mouth daily. 30 tablet 0   topiramate (TOPAMAX) 50 MG tablet Take 50 mg by mouth 2 (two) times daily.     No current facility-administered medications on file prior to visit.    Allergies:  Allergies  Allergen Reactions   Pork-Derived Products Other (See Comments) and Anaphylaxis    Religious Reasons (Muslim)  Lamictal [Lamotrigine] Rash    Vital Signs:  There were no vitals taken for this visit.   General Medical Exam:   General:  Well appearing, comfortable  Eyes/ENT: see cranial nerve examination.   Neck:  No carotid bruits. Respiratory:  Clear to auscultation, good air entry bilaterally.   Cardiac:  Regular rate and rhythm, no murmur.   Ext:  No edema ***  Neurological Exam: MENTAL STATUS including orientation to time, place, person, recent and remote memory, attention span and concentration, language, and fund of knowledge is ***normal.  Speech is not  dysarthric.  CRANIAL NERVES:  No visual field defects.  Pupils equal round and reactive to light.  Normal conjugate, extra-ocular eye movements in all directions of gaze.  No ptosis ***.  Face is symmetric. Palate elevates symmetrically.  Tongue is midline.  MOTOR:  Motor strength is 5/5 in all extremities, ***.  No atrophy, fasciculations or abnormal movements.  No pronator drift.  Tone is normal.    MSRs:  Reflexes are 2+/4 throughout ***.  SENSORY:  Intact to vibration throughout ***.  COORDINATION/GAIT:  Normal finger-to- nose-finger.  Intact rapid alternating movements bilaterally.  Gait narrow based and stable.   Data:*** MRI cervical spine 7/7/2-24: Unremarkable MRI of the cervical spine.  MRI lumbar spine 08/28/2022: Mild lower lumbar degenerative disc disease without spinal canal or neural foraminal stenosis.    Total time spent:  ***  Thank you for allowing me to participate in patient's care.  If I can answer any additional questions, I would be pleased to do so.    Sincerely,    Angala Hilgers K. Allena Katz, DO

## 2023-05-12 ENCOUNTER — Other Ambulatory Visit: Payer: Self-pay | Admitting: Pharmacist

## 2023-05-12 NOTE — Progress Notes (Signed)
 05/13/2023 Name: Shannon [name] MRN: [id number] DOB: 03-11-1965  Chief Complaint  Patient presents with   Medication Adherence    Shannon [name] is a 58 y.o. year old female who presented for a telephone visit.   They were referred to the pharmacist by their PCP for assistance in managing hypertension, hyperlipidemia, and medication nonadherence .   Subjective:  Care Team: Primary Care Provider: Avanell Shackleton, NP-C ; Next Scheduled Visit: not scheduled  Medication Access/Adherence  Current [name]:  [name] - [place], [place] - [address] [place] [address] Phone: [phone] Fax: [phone]  [id number] [place], [place] - [address] AT [facility] OF [facility] [address] [place] [address] Phone: [phone] Fax: [phone]   Patient reports affordability concerns with their medications: Yes  Patient reports access/transportation concerns to their [name]: No  Patient reports adherence concerns with their medications:  Yes    Pt has Medicaid, has had history of nonadherence related to social issues and cost of medications.  Pt notes she is overwhelmed with keeping up with all her medications. She feels that she is on a lot of medication. It is unclear how/if she is currently taking her medications.  Pt notes she did hear from [name] about packaging and they wanted her to bring her meds to them to package what she has remaining and this confused her. She notes she is out of hydrochlorothiazide and atorvastatin although she should have supply remaining according to fill history.  She notes she was out of her psych meds for 4 days and has restarted them now  Hypertension:  Current medications: hydrochlorothiazide 12.5 mg tablet daily  Medications previously tried: amlodipine, nebivolol   Hyperlipidemia/ASCVD Risk Reduction  Current lipid lowering  medications: atorvastatin 20 mg daily  Medications tried in the past: Ezetimibe/simvastatin   Diet: Pt notes she has lost about 80 lbs over years. Does not eat fried foods, has watched her diet closely. She is disappointed her cholesterol is still high despite her efforts  The 10-year ASCVD risk score (Arnett DK, et al., 2019) is: 15%   Values used to calculate the score:     Age: 86 years     Sex: Female     Is Non-Hispanic African American: Yes     Diabetic: No     Tobacco smoker: Yes     Systolic Blood Pressure: 132 mmHg     Is BP treated: Yes     HDL Cholesterol: 47 mg/dL     Total Cholesterol: 213 mg/dL   Objective:   BP Readings from Last 3 Encounters:  03/11/23 132/74  03/10/23 132/74  03/02/23 (!) 148/80     Lab Results  Component Value Date   HGBA1C 5.5 02/09/2023    Lab Results  Component Value Date   CREATININE 0.72 03/10/2023   BUN 7 03/10/2023   NA 140 03/10/2023   K 3.1 (L) 03/10/2023   CL 102 03/10/2023   CO2 30 03/10/2023    Lab Results  Component Value Date   CHOL 213 (H) 02/09/2023   HDL 47 02/09/2023   LDLCALC 142 (H) 02/09/2023   LDLDIRECT 195.6 10/26/2012   TRIG 121 02/09/2023   CHOLHDL 4.5 02/09/2023    Medications Reviewed Today   Medications were not reviewed in this encounter      Assessment/Plan:   Medication adherence: - Shannon [name] -  they did fill atorvastatin 03/30/23 90 DS and hydrochlorothiazide 04/28/23 for 30 DS. Will have to ask for an override from insurance or wait until next due   Hypertension: - Currently borderline uncontrolled, BP goal <130/80   Hyperlipidemia/ASCVD Risk Reduction: - Currently uncontrolled. LDL goal <100 - Reviewed long term complications of uncontrolled cholesterol    Follow Up Plan: 3-4 weeks  Shannon [name], PharmD, BCPS, CPP Clinical Pharmacist Practitioner [facility] [phone]

## 2023-05-14 ENCOUNTER — Other Ambulatory Visit: Payer: Self-pay

## 2023-05-14 NOTE — Patient Instructions (Addendum)
 Thank you for allowing the Care Management team to participate in your care. It was great speaking with you today!   Reminders: Please be sure to call or contact the clinic if you do not receive the updated medication delivery from [facility]. We will follow up on May 26, 2023 at 4:15 pm.  Please do not hesitate to contact me if you require assistance prior to our next outreach.    Juanell Fairly [facility] Population [facility] RN Care Manager Direct Dial: [phone]  Fax: [phone] Website: [email or web address]

## 2023-05-14 NOTE — Patient Outreach (Signed)
 Care Management   Visit Note  05/14/2023 Name: Shannon [name] MRN: [id number] DOB: 1965-05-12  Subjective: Shannon [name] is a 58 y.o. year old female who is a primary care patient of Suezanne Jacquet, [name], NP-C. The Care Management team was consulted for assistance.     Assessment:  Review of patient past medical history, allergies, medications, health status, including review of consultants reports, laboratory and other test data, was performed as part of  evaluation and provision of care management services.   Outpatient Encounter Medications as of 05/14/2023  Medication Sig Note   clonazePAM (KLONOPIN) 0.5 MG tablet Take 1 tablet (0.5 mg total) by mouth 2 (two) times daily as needed for anxiety. 04/30/2023: Reports not taking due to being out of this medication for several days: Will notify pharmacy today   FLUoxetine (PROZAC) 20 MG capsule Take 1 capsule (20 mg total) by mouth daily. 04/30/2023: Reports being out of this medication for several days. Will contact pharmacy today   pantoprazole (PROTONIX) 40 MG tablet Take 1 tablet (40 mg total) by mouth 2 (two) times daily before a meal.    QUEtiapine (SEROQUEL) 400 MG tablet Take 1 tablet (400 mg total) by mouth at bedtime.    AMBULATORY NON FORMULARY MEDICATION Walking cane R20.0 R20.2    atorvastatin (LIPITOR) 20 MG tablet Take 1 tablet (20 mg total) by mouth daily. (Patient not taking: Reported on 05/14/2023) 05/14/2023: Needs refill   Cholecalciferol (VITAMIN D3) 50 MCG (2000 UT) capsule Take 1 capsule (2,000 Units total) by mouth daily. (Patient not taking: Reported on 05/14/2023) 05/14/2023: Needs Refill   cyanocobalamin 1000 MCG tablet Take 1 tablet (1,000 mcg total) by mouth daily.    gabapentin (NEURONTIN) 400 MG capsule Take 2 capsules (800 mg total) by mouth 3 (three) times daily.    hydrochlorothiazide (HYDRODIURIL) 12.5 MG tablet Take 1 tablet (12.5 mg total) by mouth daily. (Patient not taking: Reported on 05/14/2023) 05/14/2023: Needs  Refill   Multiple Vitamins-Minerals (BARIATRIC MULTIVITAMINS/IRON) CAPS Take 1 capsule by mouth daily.    potassium chloride SA (KLOR-CON M) 20 MEQ tablet Take 1 tablet (20 mEq total) by mouth daily.    QUEtiapine (SEROQUEL) 25 MG tablet Take 1 tablet (25 mg total) by mouth 2 (two) times daily. (Patient not taking: Reported on 05/14/2023)    thiamine (VITAMIN B1) 100 MG tablet Take 1 tablet (100 mg total) by mouth daily.    topiramate (TOPAMAX) 50 MG tablet Take 50 mg by mouth 2 (two) times daily. (Patient not taking: Reported on 05/14/2023) 05/14/2023: Needs Refills   No facility-administered encounter medications on file as of 05/14/2023.     Interventions: Interventions Today    Flowsheet Row Most Recent Value  Chronic Disease   Chronic disease during today's visit Other, Hypertension (HTN)  [Peripheral Neuropathy]  General Interventions   General Interventions Discussed/Reviewed General Interventions Reviewed, Durable Medical Equipment (DME), Community Resources, Doctor Visits  Doctor Visits Discussed/Reviewed Doctor Visits Reviewed, Specialist  Durable Medical Equipment (DME) Walker  PCP/Specialist Visits Compliance with follow-up visit  Exercise Interventions   Exercise Discussed/Reviewed Assistive device use and maintanence  Education Interventions   Education Provided Provided Education  Provided Verbal Education On Nutrition, Foot Care, Labs, Mental Health/Coping with Illness, Applications, Medication, When to see the doctor, [employer], Other  [Offered option for In-Home provider services due to continue challenges with obtaining transportation]  Mental Health Interventions   Mental Health Discussed/Reviewed Coping Strategies, Mental Health Reviewed  Nutrition Interventions   Nutrition Discussed/Reviewed  Nutrition Reviewed  Pharmacy Interventions   Pharmacy Dicussed/Reviewed Pharmacy Topics Reviewed, Medications and their functions, Medication Adherence  Medication  Adherence --  [Pending receipt of refills from [facility]]  Safety Interventions   Safety Discussed/Reviewed Safety Reviewed, Fall Risk        PLAN Ms. Beg agreed to follow up next month.    Juanell Fairly [facility] RN Care Manager Direct Dial: [phone]  Fax: [phone] Website: [email or web address]

## 2023-05-17 ENCOUNTER — Telehealth (HOSPITAL_BASED_OUTPATIENT_CLINIC_OR_DEPARTMENT_OTHER): Payer: Medicare Other | Admitting: Psychiatry

## 2023-05-17 ENCOUNTER — Encounter (HOSPITAL_COMMUNITY): Payer: Self-pay | Admitting: Psychiatry

## 2023-05-17 DIAGNOSIS — F319 Bipolar disorder, unspecified: Secondary | ICD-10-CM | POA: Diagnosis not present

## 2023-05-17 DIAGNOSIS — F4310 Post-traumatic stress disorder, unspecified: Secondary | ICD-10-CM | POA: Diagnosis not present

## 2023-05-17 DIAGNOSIS — F411 Generalized anxiety disorder: Secondary | ICD-10-CM | POA: Diagnosis not present

## 2023-05-17 MED ORDER — FLUOXETINE HCL 20 MG PO CAPS: 20.0000 mg | ORAL_CAPSULE | Freq: Every day | ORAL | 2 refills | Status: DC

## 2023-05-17 MED ORDER — CLONAZEPAM 0.5 MG PO TABS: 0.5000 mg | ORAL_TABLET | Freq: Two times a day (BID) | ORAL | 2 refills | Status: DC | PRN

## 2023-05-17 MED ORDER — QUETIAPINE FUMARATE 400 MG PO TABS: 400.0000 mg | ORAL_TABLET | Freq: Every day | ORAL | 2 refills | Status: DC
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
## 2023-05-18 ENCOUNTER — Ambulatory Visit: Payer: Self-pay

## 2023-05-18 NOTE — Patient Instructions (Signed)
 Visit Information  Thank you for taking time to visit with me today. Please don't hesitate to contact me if I can be of assistance to you.   Following are the goals we discussed today:  Patient met her goal of obtaining SCAT services.    If you are experiencing a Mental Health or Behavioral Health Crisis or need someone to talk to, please call 911  Patient verbalizes understanding of instructions and care plan provided today and agrees to view in MyChart. Active MyChart status and patient understanding of how to access instructions and care plan via MyChart confirmed with patient.     No further follow up required: Patient does not request a follow up visit.  Lysle Morales, BSW [place]  [facility], [facility] Social Worker Direct Dial: [phone]  Fax: [phone] Website: [email or web address]

## 2023-05-18 NOTE — Patient Outreach (Signed)
 Care Coordination   Follow Up Visit Note   05/18/2023 Name: Shannon [name] MRN: [id number] DOB: August 21, 1965  Shannon [name] is a 58 y.o. year old female who sees [place], [place], NP-C for primary care. I spoke with  Shannon [name] by phone today.  What matters to the patients health and wellness today?  Patient has been approved for SCAT and reports no other unmet need.    Goals Addressed             This Visit's Progress    Care Coordination Activities       Interventions Today    Flowsheet Row Most Recent Value  General Interventions   General Interventions Discussed/Reviewed General Interventions Discussed, General Interventions Reviewed, [facility] has been approved for SCAT and received information in the mail.Pt has no upcoming appointments.Pt will continue to look for furniture and will use community social outlet to locate used mattress.Pt reports she has no other unmet need.]              SDOH assessments and interventions completed:  No     Care Coordination Interventions:  Yes, provided   Follow up plan: No further intervention required.   Encounter Outcome:  Patient Visit Completed

## 2023-05-25 ENCOUNTER — Other Ambulatory Visit: Payer: Self-pay | Admitting: Family Medicine

## 2023-05-25 DIAGNOSIS — I10 Essential (primary) hypertension: Secondary | ICD-10-CM

## 2023-05-26 ENCOUNTER — Other Ambulatory Visit: Payer: Self-pay

## 2023-05-26 ENCOUNTER — Other Ambulatory Visit (HOSPITAL_COMMUNITY): Payer: Self-pay | Admitting: Psychiatry

## 2023-05-26 DIAGNOSIS — F315 Bipolar disorder, current episode depressed, severe, with psychotic features: Secondary | ICD-10-CM

## 2023-05-26 DIAGNOSIS — F319 Bipolar disorder, unspecified: Secondary | ICD-10-CM

## 2023-05-26 DIAGNOSIS — Z59819 Housing instability, housed unspecified: Secondary | ICD-10-CM

## 2023-05-26 DIAGNOSIS — Z9181 History of falling: Secondary | ICD-10-CM

## 2023-05-26 DIAGNOSIS — F3160 Bipolar disorder, current episode mixed, unspecified: Secondary | ICD-10-CM

## 2023-05-26 DIAGNOSIS — R600 Localized edema: Secondary | ICD-10-CM
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
## 2023-05-26 NOTE — Patient Instructions (Signed)
Thank you for allowing the Care Management team to participate in your care.

## 2023-05-27 ENCOUNTER — Other Ambulatory Visit
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
## 2023-05-27 NOTE — Patient Instructions (Addendum)
 Thank you for allowing the Care Management team to participate in your care.   Reminders: -Please be sure to check your voice messages for return calls from the agencies we contacted today. -Please follow the safety precautions discussed and recommendations provided by the law enforcement team.  -We will follow up on June 09, 2023 at 4:15. We will touch base sooner if needed to discuss resources.   If you are in danger or need immediate medical attention: Call 911.  If you are experiencing a Mental Health or Behavioral Health Crisis -Please call the Suicide and Crisis Lifeline: 988 -Call the [place] National Suicide Prevention Lifeline: 303-384-6300 or TTY: 212-330-0274 TTY 636-194-2769) to talk to a trained counselor -Call 1-800-273-TALK (toll free, 24 hour hotline) -Go to [facility] [address] [place] [phone]) -Call 911      Juanell Fairly [facility] Population Health RN Care Manager Direct Dial: [phone]  Fax: [phone] Website: [email or web address]

## 2023-05-31 ENCOUNTER — Telehealth: Payer: Self-pay | Admitting: *Deleted

## 2023-05-31 NOTE — Progress Notes (Unsigned)
 Complex Care Management Note Care Guide Note  05/31/2023 Name: EMILEE MARKET MRN: [id number] DOB: 03/28/1965   Complex Care Management Outreach Attempts: An unsuccessful telephone outreach was attempted today to offer the patient information about available complex care management services.  Follow Up Plan:  Additional outreach attempts will be made to offer the patient complex care management information and services.   Encounter Outcome:  No Answer  Burman Nieves, CMA   [facility], [facility] Guide Direct Dial: [phone]  Fax: [phone] Website: [email or web address]

## 2023-06-01 NOTE — Progress Notes (Signed)
 Complex Care Management Note  Care Guide Note 06/01/2023 Name: Shannon [name] MRN: [id number] DOB: [birth year]  Shannon [name] is a 58 y.o. year old female who sees [place], [place], NP-C for primary care. I reached out to [name] by phone today to offer complex care management services.  [name] was given information about Complex Care Management services today including:   The Complex Care Management services include support from the care team which includes your Nurse Care Manager, Clinical Social Worker, or Pharmacist.  The Complex Care Management team is here to help remove barriers to the health concerns and goals most important to you. Complex Care Management services are voluntary, and the patient may decline or stop services at any time by request to their care team member.   Complex Care Management Consent Status: Patient agreed to services and verbal consent obtained.   Follow up plan:  Telephone appointment with complex care management team member scheduled for:  06/03/2023  Encounter Outcome:  Patient Scheduled  Burman Nieves, CMA [place]  [facility], [facility] Guide Direct Dial: [phone]  Fax: [phone] Website: [email or web address]

## 2023-06-02 ENCOUNTER — Other Ambulatory Visit: Payer: Self-pay | Admitting: *Deleted

## 2023-06-02 ENCOUNTER — Other Ambulatory Visit: Payer: Self-pay
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
## 2023-06-02 NOTE — Patient Outreach (Addendum)
 Complex Care Management   Visit Note  06/02/2023  Name:  Shannon [name] MRN: [id number] DOB: 1965-09-25  Situation: Referral received for Complex Care Management related to SDOH Barriers:  Housing needs.   I obtained verbal consent from patient.  Visit completed with patient  on the phone  Background:   Past Medical History:  Diagnosis Date   Allergic rhinitis    Allergic rhinitis 01/25/2009        ANEMIA-IRON DEFICIENCY 01/25/2009   Anxiety    Backache 01/25/2009   Qualifier: Diagnosis of  By: Jonny Ruiz MD, Len Blalock    Bipolar 1 disorder [facility])    Chronic pain syndrome    Complete rotator cuff tear 07/21/2013   Complete tear of right rotator cuff 07/21/2013   Contact lens/glasses fitting    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   DISC DISEASE, LUMBAR 01/25/2009   Essential hypertension 01/25/2009   On Bystolic    GAD (generalized anxiety disorder) 12/28/2013   GERD (gastroesophageal reflux disease)    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   Headache, acute 12/20/2013   Hx of laparoscopic gastric banding 09/03/2010   Surgery date: 07/17/10    HYPERLIPIDEMIA 01/25/2009   Hyperlipidemia with target LDL less than 130 01/25/2009   HYPERTENSION 01/25/2009   Hypokalemia, inadequate intake 09/01/2011   Insomnia 04/04/2011   INSOMNIA-SLEEP DISORDER-UNSPEC 04/30/2009   Qualifier: Diagnosis of  By: Jonny Ruiz MD, Len Blalock    Iron deficiency anemia 01/25/2009        Labyrinthitis 02/19/2014   Leiomyoma of uterus, unspecified 08/13/2008   Overview:  Leiomyoma Of The Uterus  10/1 IMO update   Localized edema 06/12/2015   MANIC DEPRESSIVE ILLNESS 01/25/2009   pt is unsue if this is her specifc dx   Mixed bipolar I disorder (HCC) 07/12/2012   Morbid obesity (HCC) 01/25/2009   Night sweats    Osteopenia determined by x-ray 06/13/2021   Other abnormal glucose 10/26/2012   Palpitations 10/25/2013   PEPTIC ULCER DISEASE, HELICOBACTER PYLORI POSITIVE 10/03/2009   Peripheral edema 06/25/2015   Pernicious anemia 03/28/2012    Piriformis syndrome of both sides 09/02/2018   PUD (peptic ulcer disease) 10/03/2009        Right shoulder pain 05/03/2013   Dg Shoulder Right  05/03/2013   CLINICAL DATA Pain.  EXAM RIGHT SHOULDER - 2+ VIEW  COMPARISON None.  FINDINGS Acromioclavicular and glenohumeral degenerative change present. Questionable calcific density noted in the region of the supraspinatus space, possibly representing calcific supraspinatus tendinitis. This could represent a sclerotic density in the acromion. MRI of the right shoulder suggest   Sleep apnea 08/12/2016   SMOKER 01/25/2009   Qualifier: Diagnosis of  By: Jonny Ruiz MD, Len Blalock    Subacromial bursitis 05/17/2013   SUBSTANCE ABUSE 11/06/2009        Thiamin deficiency 10/30/2012   Tobacco abuse 06/09/2010   Visual disturbance 05/03/2013   Vitamin D deficiency 10/27/2012    Assessment: Patient Reported Symptoms:  Cognitive Alert and oriented to person, place, and time  Neurological      HEENT No symptoms reported    Cardiovascular No symptoms reported    Respiratory No symptoms reported    Endocrine No symptoms reported    Gastrointestinal Abdominal pain or discomfort    Genitourinary No symptoms reported    Integumentary Not assessed    Musculoskeletal Difficulty walking, Unsteady gait    Psychosocial Depression - if selected complete PHQ 2-9     There were no vitals filed for this  visit.  Medications Reviewed Today     Reviewed by Wenda [place], LCSW (Social Worker) on 06/02/23 at 1643  Med List Status: <None>   Medication Order Taking? Sig Documenting Provider Last Dose Status Informant  AMBULATORY NON FORMULARY MEDICATION [id number]  Walking cane R20.0 R20.2 Richardean Sale, DO  Active   atorvastatin (LIPITOR) 20 MG tablet [id number] Yes Take 1 tablet (20 mg total) by mouth daily. Avanell Shackleton, NP-C Taking Active            Med Note Constance Haw, FELECIA N   Fri May 14, 2023  4:02 PM) Needs refill  Cholecalciferol (VITAMIN D3) 50 MCG  (2000 UT) capsule [id number] Yes Take 1 capsule (2,000 Units total) by mouth daily. Avanell Shackleton, NP-C Taking Active            Med Note Constance Haw, FELECIA N   Fri May 14, 2023  4:02 PM) Needs Refill  clonazePAM (KLONOPIN) 0.5 MG tablet [id number] Yes Take 1 tablet (0.5 mg total) by mouth 2 (two) times daily as needed for anxiety. Cleotis Nipper, MD Taking Active   cyanocobalamin 1000 MCG tablet [id number] Yes Take 1 tablet (1,000 mcg total) by mouth daily. Regalado, Belkys A, MD Taking Active   FLUoxetine (PROZAC) 20 MG capsule [id number] Yes Take 1 capsule (20 mg total) by mouth daily. Cleotis Nipper, MD Taking Active   gabapentin (NEURONTIN) 400 MG capsule [id number]  Take 2 capsules (800 mg total) by mouth 3 (three) times daily. Richardean Sale, DO  Expired 05/10/23 2359   hydrochlorothiazide (HYDRODIURIL) 12.5 MG tablet [id number] Yes TAKE 1 TABLET (12.5 MG TOTAL) BY MOUTH DAILY. Avanell Shackleton, NP-C Taking Active   Multiple Vitamins-Minerals (BARIATRIC MULTIVITAMINS/IRON) CAPS [id number] Yes Take 1 capsule by mouth daily. Avanell Shackleton, NP-C Taking Active Self, Pharmacy Records           Med Note Tresa Garter D   Thu Jan 28, 2023  7:31 AM)    pantoprazole (PROTONIX) 40 MG tablet [id number] Yes Take 1 tablet (40 mg total) by mouth 2 (two) times daily before a meal. Henson, [name], NP-C Taking Active   potassium chloride SA (KLOR-CON M) 20 MEQ tablet [id number]  Take 1 tablet (20 mEq total) by mouth daily. Henson, [name], NP-C  Expired 04/29/23 2359   QUEtiapine (SEROQUEL) 25 MG tablet [id number]  Take 1 tablet (25 mg total) by mouth 2 (two) times daily.  Patient not taking: Reported on 05/14/2023   Cleotis Nipper, MD  Expired 05/17/23 2359   QUEtiapine (SEROQUEL) 400 MG tablet [id number] Yes Take 1 tablet (400 mg total) by mouth at bedtime. Cleotis Nipper, MD Taking Active   thiamine (VITAMIN B1) 100 MG tablet [id number] Yes Take 1 tablet (100 mg total) by mouth daily. Regalado, Belkys  A, MD Taking Active   topiramate (TOPAMAX) 50 MG tablet [id number]  Take 50 mg by mouth 2 (two) times daily.  Patient not taking: Reported on 05/14/2023   [provider]  Active            Med Note [place], FELECIA N   Fri May 14, 2023  4:04 PM) Needs Refills            Recommendation:   CSW will provide patient with housing resources to consider including options for assisted living. Family care homes and additional boarding homes both local and surrounding area  Follow Up Plan:   Telephone follow-up 06/23/23  Verna Czech, LCSW [place]  [facility], [facility] Licensed Clinical Social Geologist, engineering Dial: [phone]

## 2023-06-03 ENCOUNTER — Other Ambulatory Visit: Admitting: *Deleted

## 2023-06-09 ENCOUNTER — Other Ambulatory Visit: Payer: Self-pay
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
## 2023-06-09 NOTE — Patient Instructions (Signed)
 Thank you for allowing the Care Management team to participate in your care. It was great speaking with you today!   We will follow up on June 21, 2023 at 4:30. Please do not hesitate to contact me if you require assistance prior to our next outreach.   If you are experiencing a Mental Health or Behavioral Health Crisis: -Please call the Suicide and Crisis Lifeline: 988 -Call the [place]  National Suicide Prevention Lifeline: 769 604 6470 or TTY: 620 538 3651 TTY (671) 777-3097) to talk to a trained counselor -Call 911     Roxie Cord [facility] Population Health RN Care Manager Direct Dial: [phone]  Fax: [phone] Website: [email or web address]

## 2023-06-21 ENCOUNTER — Other Ambulatory Visit: Payer: Self-pay

## 2023-06-21 NOTE — Patient Outreach (Signed)
 Complex Care Management   Visit Note  06/21/2023  Name:  Shannon [name] MRN: [id number] DOB: [birth year]  Situation: Referral received for Complex Care Management related to {Criteria:32550} I obtained verbal consent from {CHL AMB Patient/Caregiver:28184}.  Visit completed with ***  {VISIT LOCATION:32553}  Background:   Past Medical History:  Diagnosis Date   Allergic rhinitis    Allergic rhinitis 01/25/2009        ANEMIA-IRON  DEFICIENCY 01/25/2009   Anxiety    Backache 01/25/2009   Qualifier: Diagnosis of  By: Autry Legions MD, Alveda Aures    Bipolar 1 disorder [facility])    Chronic pain syndrome    Complete rotator cuff tear 07/21/2013   Complete tear of right rotator cuff 07/21/2013   Contact lens/glasses fitting    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   DISC DISEASE, LUMBAR 01/25/2009   Essential hypertension 01/25/2009   On Bystolic     GAD (generalized anxiety disorder) 12/28/2013   GERD (gastroesophageal reflux disease)    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   Headache, acute 12/20/2013   Hx of laparoscopic gastric banding 09/03/2010   Surgery date: 07/17/10    HYPERLIPIDEMIA 01/25/2009   Hyperlipidemia with target LDL less than 130 01/25/2009   HYPERTENSION 01/25/2009   Hypokalemia, inadequate intake 09/01/2011   Insomnia 04/04/2011   INSOMNIA-SLEEP DISORDER-UNSPEC 04/30/2009   Qualifier: Diagnosis of  By: Autry Legions MD, Alveda Aures    Iron  deficiency anemia 01/25/2009        Labyrinthitis 02/19/2014   Leiomyoma of uterus, unspecified 08/13/2008   Overview:  Leiomyoma Of The Uterus  10/1 IMO update   Localized edema 06/12/2015   MANIC DEPRESSIVE ILLNESS 01/25/2009   pt is unsue if this is her specifc dx   Mixed bipolar I disorder (HCC) 07/12/2012   Morbid obesity (HCC) 01/25/2009   Night sweats    Osteopenia determined by x-ray 06/13/2021   Other abnormal glucose 10/26/2012   Palpitations 10/25/2013   PEPTIC ULCER DISEASE, HELICOBACTER PYLORI POSITIVE 10/03/2009   Peripheral edema 06/25/2015   Pernicious  anemia 03/28/2012   Piriformis syndrome of both sides 09/02/2018   PUD (peptic ulcer disease) 10/03/2009        Right shoulder pain 05/03/2013   Dg Shoulder Right  05/03/2013   CLINICAL DATA Pain.  EXAM RIGHT SHOULDER - 2+ VIEW  COMPARISON None.  FINDINGS Acromioclavicular and glenohumeral degenerative change present. Questionable calcific density noted in the region of the supraspinatus space, possibly representing calcific supraspinatus tendinitis. This could represent a sclerotic density in the acromion. MRI of the right shoulder suggest   Sleep apnea 08/12/2016   SMOKER 01/25/2009   Qualifier: Diagnosis of  By: Autry Legions MD, Alveda Aures    Subacromial bursitis 05/17/2013   SUBSTANCE ABUSE 11/06/2009        Thiamin deficiency 10/30/2012   Tobacco abuse 06/09/2010   Visual disturbance 05/03/2013   Vitamin D  deficiency 10/27/2012    Assessment: Patient Reported Symptoms:  Cognitive Cognitive Status: Alert and oriented to person, place, and time, Normal speech and language skills Cognitive/Intellectual Conditions Management [RPT]: None reported or documented in medical history or problem list      Neurological      HEENT        Cardiovascular      Respiratory      Endocrine      Gastrointestinal        Genitourinary      Integumentary      Musculoskeletal  Psychosocial              06/09/2023    5:30 PM  Depression screen PHQ 2/9  Decreased Interest 1  Down, Depressed, Hopeless 1  PHQ - 2 Score 2    There were no vitals filed for this visit.  Medications Reviewed Today     Reviewed by Roxie Cord, RN (Registered Nurse) on 06/21/23 at 1617  Med List Status: <None>   Medication Order Taking? Sig Documenting Provider Last Dose Status Informant  AMBULATORY NON FORMULARY MEDICATION [id number]  Walking cane R20.0 R20.2 Ulysees Gander, DO  Active   atorvastatin  (LIPITOR) 20 MG tablet [id number] No Take 1 tablet (20 mg total) by mouth daily. Abram Abraham, NP-C  Taking Active            Med Note [facility], Summerlynn Glauser N   Fri May 14, 2023  4:02 PM) Needs refill  Cholecalciferol  (VITAMIN D3) 50 MCG (2000 UT) capsule [id number] No Take 1 capsule (2,000 Units total) by mouth daily. Abram Abraham, NP-C Taking Active            Med Note Dessa Floss, Jerry Clyne N   Fri May 14, 2023  4:02 PM) Needs Refill  clonazePAM  (KLONOPIN ) 0.5 MG tablet [id number] No Take 1 tablet (0.5 mg total) by mouth 2 (two) times daily as needed for anxiety. Arturo Late, MD Taking Active   cyanocobalamin  1000 MCG tablet [id number] No Take 1 tablet (1,000 mcg total) by mouth daily. Regalado, Belkys A, MD Taking Active   FLUoxetine  (PROZAC ) 20 MG capsule [id number] No Take 1 capsule (20 mg total) by mouth daily.  Patient not taking: Reported on 06/09/2023   Arfeen, Syed T, MD Not Taking Active   gabapentin  (NEURONTIN ) 400 MG capsule [id number] No Take 2 capsules (800 mg total) by mouth 3 (three) times daily. Ulysees Gander, DO Taking Expired 05/10/23 2359   hydrochlorothiazide  (HYDRODIURIL ) 12.5 MG tablet [id number] No TAKE 1 TABLET (12.5 MG TOTAL) BY MOUTH DAILY. Abram Abraham, NP-C Taking Active   Multiple Vitamins-Minerals (BARIATRIC MULTIVITAMINS/IRON ) CAPS [id number] No Take 1 capsule by mouth daily. Abram Abraham, NP-C Taking Active Self, Pharmacy Records           Med Note (RODRIGUEZ, CHANELLE D   Thu Jan 28, 2023  7:31 AM)    pantoprazole  (PROTONIX ) 40 MG tablet [id number] No Take 1 tablet (40 mg total) by mouth 2 (two) times daily before a meal. Henson, [name], NP-C Taking Active   potassium chloride  SA (KLOR-CON  M) 20 MEQ tablet [id number]  Take 1 tablet (20 mEq total) by mouth daily. Henson, [name], NP-C  Expired 04/29/23 2359   QUEtiapine  (SEROQUEL ) 25 MG tablet [id number] No Take 1 tablet (25 mg total) by mouth 2 (two) times daily.  Patient not taking: Reported on 04/08/2023   Arfeen, Syed T, MD Not Taking Expired 05/17/23 2359   QUEtiapine  (SEROQUEL ) 400 MG tablet [id number] No  Take 1 tablet (400 mg total) by mouth at bedtime. Arfeen, Syed T, MD Taking Active   thiamine  (VITAMIN B1) 100 MG tablet [id number] No Take 1 tablet (100 mg total) by mouth daily. Regalado, Belkys A, MD Taking Active   topiramate  (TOPAMAX ) 50 MG tablet [id number] No Take 50 mg by mouth 2 (two) times daily.  Patient not taking: Reported on 05/14/2023   [provider] Not Taking Active            Med Note [facility], Harry Shuck N   Fri May 14, 2023  4:04 PM) Needs Refills            Recommendation:   {RECOMMENDATONS:32554}  Follow Up Plan:   {FOLLOWUP:32559}  SIG ***

## 2023-06-21 NOTE — Patient Instructions (Signed)
 Thank you for allowing the Complex Care Management team to participate in your care.

## 2023-06-23 ENCOUNTER — Other Ambulatory Visit: Payer: Self-pay | Admitting: *Deleted

## 2023-06-24 NOTE — Patient Instructions (Signed)
 Visit Information  Thank you for taking time to visit with me today. Please don't hesitate to contact me if I can be of assistance to you before our next scheduled appointment.  Your next care management appointment is by telephone on 07/15/23 at 2pm  Telephone follow-up 07/15/23  Please call the care guide team at [phone] if you need to cancel, schedule, or reschedule an appointment.   Please call the Suicide and Crisis Lifeline: 988 if you are experiencing a Mental Health or Behavioral Health Crisis or need someone to talk to.  Pammie Chirino, LCSW [place]  [facility], [facility] Licensed Clinical Social Worker Care Coordinator  Direct Dial: [phone]

## 2023-06-24 NOTE — Patient Outreach (Signed)
 Complex Care Management   Visit Note  06/24/2023 late entry  Name:  Shannon [name] MRN: [id number] DOB: [birth year]  Situation: Referral received for Complex Care Management related to [place]/Behavioral Health diagnosis Bi-polar, housing challenges  I obtained verbal consent from Patient.  Visit completed with patient on 06/23/23  on the phone  Background:   Past Medical History:  Diagnosis Date   Allergic rhinitis    Allergic rhinitis 01/25/2009        ANEMIA-IRON  DEFICIENCY 01/25/2009   Anxiety    Backache 01/25/2009   Qualifier: Diagnosis of  By: Autry Legions MD, Alveda Aures    Bipolar 1 disorder [facility])    Chronic pain syndrome    Complete rotator cuff tear 07/21/2013   Complete tear of right rotator cuff 07/21/2013   Contact lens/glasses fitting    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   DISC DISEASE, LUMBAR 01/25/2009   Essential hypertension 01/25/2009   On Bystolic     GAD (generalized anxiety disorder) 12/28/2013   GERD (gastroesophageal reflux disease)    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   Headache, acute 12/20/2013   Hx of laparoscopic gastric banding 09/03/2010   Surgery date: 07/17/10    HYPERLIPIDEMIA 01/25/2009   Hyperlipidemia with target LDL less than 130 01/25/2009   HYPERTENSION 01/25/2009   Hypokalemia, inadequate intake 09/01/2011   Insomnia 04/04/2011   INSOMNIA-SLEEP DISORDER-UNSPEC 04/30/2009   Qualifier: Diagnosis of  By: Autry Legions MD, Alveda Aures    Iron  deficiency anemia 01/25/2009        Labyrinthitis 02/19/2014   Leiomyoma of uterus, unspecified 08/13/2008   Overview:  Leiomyoma Of The Uterus  10/1 IMO update   Localized edema 06/12/2015   MANIC DEPRESSIVE ILLNESS 01/25/2009   pt is unsue if this is her specifc dx   Mixed bipolar I disorder (HCC) 07/12/2012   Morbid obesity (HCC) 01/25/2009   Night sweats    Osteopenia determined by x-ray 06/13/2021   Other abnormal glucose 10/26/2012   Palpitations 10/25/2013   PEPTIC ULCER DISEASE, HELICOBACTER PYLORI POSITIVE 10/03/2009    Peripheral edema 06/25/2015   Pernicious anemia 03/28/2012   Piriformis syndrome of both sides 09/02/2018   PUD (peptic ulcer disease) 10/03/2009        Right shoulder pain 05/03/2013   Dg Shoulder Right  05/03/2013   CLINICAL DATA Pain.  EXAM RIGHT SHOULDER - 2+ VIEW  COMPARISON None.  FINDINGS Acromioclavicular and glenohumeral degenerative change present. Questionable calcific density noted in the region of the supraspinatus space, possibly representing calcific supraspinatus tendinitis. This could represent a sclerotic density in the acromion. MRI of the right shoulder suggest   Sleep apnea 08/12/2016   SMOKER 01/25/2009   Qualifier: Diagnosis of  By: Autry Legions MD, Alveda Aures    Subacromial bursitis 05/17/2013   SUBSTANCE ABUSE 11/06/2009        Thiamin deficiency 10/30/2012   Tobacco abuse 06/09/2010   Visual disturbance 05/03/2013   Vitamin D  deficiency 10/27/2012    Assessment: Patient Reported Symptoms:  Cognitive Cognitive Status: Alert and oriented to person, place, and time, Insightful and able to interpret abstract concepts Cognitive/Intellectual Conditions Management [RPT]: None reported or documented in medical history or problem list      Neurological Neurological Review of Symptoms: Not assessed    HEENT HEENT Symptoms Reported: No symptoms reported      Cardiovascular Cardiovascular Symptoms Reported: Swelling in legs or feet Does patient have uncontrolled Hypertension?: No Cardiovascular Conditions: High blood cholesterol Cardiovascular Management Strategies: Medication therapy, Routine screening Cardiovascular Self-Management  Outcome: 4 (good)  Respiratory      Endocrine Patient reports the following symptoms related to hypoglycemia or hyperglycemia : Not assessed    Gastrointestinal Gastrointestinal Symptoms Reported: Not assessed      Genitourinary Genitourinary Symptoms Reported: No symptoms reported    Integumentary Integumentary Symptoms Reported: No symptoms reported     Musculoskeletal Musculoskelatal Symptoms Reviewed: Not assessed        Psychosocial Psychosocial Symptoms Reported: No symptoms reported Additional Psychological Details: Patient states that her mood has improved-confirms taking her medications as prescibed Behavioral Health Conditions: Depression, Bipolar affective disorder Behavioral Management Strategies: Coping strategies, Counseling, Medication therapy Behavioral Health Comment: Follow appointment with Dr. Lorene Roman scheduled for 07/02/23 at 11:30am Major Change/Loss/Stressor/Fears (CP): Environment Behaviors When Feeling Stressed/Fearful: Patient states that she is making more of an effort to spend more time outside of her home. Per patient, she finds the sun exposure beneficial Techniques to Cope with Loss/Stress/Change: Counseling, Medication Quality of Family Relationships:  (limited family support) Do you feel physically threatened by others?: No (reports no immediate concerns with current boarder, states that he has taken accountabilty for his past actions)      06/23/2023   11:18 AM  Depression screen PHQ 2/9  Decreased Interest 0  Down, Depressed, Hopeless 0  PHQ - 2 Score 0  Altered sleeping 0  Tired, decreased energy 0  Change in appetite 0  Trouble concentrating 0  Moving slowly or fidgety/restless 0  Suicidal thoughts 0  PHQ-9 Score 0    There were no vitals filed for this visit.  Medications Reviewed Today     Reviewed by Ave Leisure, LCSW (Social Worker) on 06/23/23 at 1109  Med List Status: <None>   Medication Order Taking? Sig Documenting Provider Last Dose Status Informant  AMBULATORY NON FORMULARY MEDICATION [id number]  Walking cane R20.0 R20.2 Ulysees Gander, DO  Active   atorvastatin  (LIPITOR) 20 MG tablet [id number] Yes Take 1 tablet (20 mg total) by mouth daily. Abram Abraham, NP-C Taking Active            Med Note Dessa Floss, FELECIA N   Fri May 14, 2023  4:02 PM) Needs refill   Cholecalciferol  (VITAMIN D3) 50 MCG (2000 UT) capsule [id number] Yes Take 1 capsule (2,000 Units total) by mouth daily. Abram Abraham, NP-C Taking Active            Med Note Dessa Floss, FELECIA N   Fri May 14, 2023  4:02 PM) Needs Refill  clonazePAM  (KLONOPIN ) 0.5 MG tablet [id number] Yes Take 1 tablet (0.5 mg total) by mouth 2 (two) times daily as needed for anxiety. Arturo Late, MD Taking Active   cyanocobalamin  1000 MCG tablet [id number] Yes Take 1 tablet (1,000 mcg total) by mouth daily. Regalado, Belkys A, MD Taking Active   FLUoxetine  (PROZAC ) 20 MG capsule [id number] Yes Take 1 capsule (20 mg total) by mouth daily. Arfeen, Syed T, MD Taking Active   gabapentin  (NEURONTIN ) 400 MG capsule [id number]  Take 2 capsules (800 mg total) by mouth 3 (three) times daily. Ulysees Gander, DO  Expired 05/10/23 2359   hydrochlorothiazide  (HYDRODIURIL ) 12.5 MG tablet [id number] Yes TAKE 1 TABLET (12.5 MG TOTAL) BY MOUTH DAILY. Abram Abraham, NP-C Taking Active   Multiple Vitamins-Minerals (BARIATRIC MULTIVITAMINS/IRON ) CAPS [id number] Yes Take 1 capsule by mouth daily. Abram Abraham, NP-C Taking Active Self, Pharmacy Records           Med Note Nanine Babcock, [place] D   Thu Jan 28, 2023  7:31 AM)    pantoprazole  (PROTONIX ) 40 MG tablet [id number] Yes Take 1 tablet (40 mg total) by mouth 2 (two) times daily before a meal. Henson, [name], NP-C Taking Active   potassium chloride  SA (KLOR-CON  M) 20 MEQ tablet [id number]  Take 1 tablet (20 mEq total) by mouth daily. Henson, [name], NP-C  Expired 04/29/23 2359   QUEtiapine  (SEROQUEL ) 25 MG tablet [id number]  Take 1 tablet (25 mg total) by mouth 2 (two) times daily.  Patient not taking: Reported on 04/08/2023   Arfeen, Syed T, MD  Expired 05/17/23 2359   QUEtiapine  (SEROQUEL ) 400 MG tablet [id number] Yes Take 1 tablet (400 mg total) by mouth at bedtime. Arfeen, Syed T, MD Taking Active   thiamine  (VITAMIN B1) 100 MG tablet [id number] Yes Take 1 tablet (100 mg  total) by mouth daily. Regalado, Belkys A, MD Taking Active   topiramate  (TOPAMAX ) 50 MG tablet [id number] Yes Take 50 mg by mouth 2 (two) times daily. [provider] Taking Active            Med Note Dessa Floss, Jonnie Nettles   Fri May 14, 2023  4:04 PM) Needs Refills            Recommendation:   Specialty provider follow-up [name]-psychiatry 06/23/23  Follow Up Plan:   Telephone follow-up 07/15/23  Michaelle Adolphus, LCSW [facility], [facility] Licensed Clinical Social Worker Care Coordinator  Direct Dial: [phone]

## 2023-06-25 ENCOUNTER — Other Ambulatory Visit: Payer: Self-pay

## 2023-06-25 NOTE — Patient Outreach (Unsigned)
 Complex Care Management   Visit Note  06/25/2023  Name:  Shannon [name] MRN: [id number] DOB: 12/26/1965  Situation: Referral received for Complex Care Management related to {Criteria:32550} I obtained verbal consent from {CHL AMB Patient/Caregiver:28184}.  Visit completed with ***  {VISIT LOCATION:32553}  Background:   Past Medical History:  Diagnosis Date   Allergic rhinitis    Allergic rhinitis 01/25/2009        ANEMIA-IRON  DEFICIENCY 01/25/2009   Anxiety    Backache 01/25/2009   Qualifier: Diagnosis of  By: Autry Legions MD, Alveda Aures    Bipolar 1 disorder [facility])    Chronic pain syndrome    Complete rotator cuff tear 07/21/2013   Complete tear of right rotator cuff 07/21/2013   Contact lens/glasses fitting    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   DISC DISEASE, LUMBAR 01/25/2009   Essential hypertension 01/25/2009   On Bystolic     GAD (generalized anxiety disorder) 12/28/2013   GERD (gastroesophageal reflux disease)    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   Headache, acute 12/20/2013   Hx of laparoscopic gastric banding 09/03/2010   Surgery date: 07/17/10    HYPERLIPIDEMIA 01/25/2009   Hyperlipidemia with target LDL less than 130 01/25/2009   HYPERTENSION 01/25/2009   Hypokalemia, inadequate intake 09/01/2011   Insomnia 04/04/2011   INSOMNIA-SLEEP DISORDER-UNSPEC 04/30/2009   Qualifier: Diagnosis of  By: Autry Legions MD, Alveda Aures    Iron  deficiency anemia 01/25/2009        Labyrinthitis 02/19/2014   Leiomyoma of uterus, unspecified 08/13/2008   Overview:  Leiomyoma Of The Uterus  10/1 IMO update   Localized edema 06/12/2015   MANIC DEPRESSIVE ILLNESS 01/25/2009   pt is unsue if this is her specifc dx   Mixed bipolar I disorder (HCC) 07/12/2012   Morbid obesity (HCC) 01/25/2009   Night sweats    Osteopenia determined by x-ray 06/13/2021   Other abnormal glucose 10/26/2012   Palpitations 10/25/2013   PEPTIC ULCER DISEASE, HELICOBACTER PYLORI POSITIVE 10/03/2009   Peripheral edema 06/25/2015   Pernicious  anemia 03/28/2012   Piriformis syndrome of both sides 09/02/2018   PUD (peptic ulcer disease) 10/03/2009        Right shoulder pain 05/03/2013   Dg Shoulder Right  05/03/2013   CLINICAL DATA Pain.  EXAM RIGHT SHOULDER - 2+ VIEW  COMPARISON None.  FINDINGS Acromioclavicular and glenohumeral degenerative change present. Questionable calcific density noted in the region of the supraspinatus space, possibly representing calcific supraspinatus tendinitis. This could represent a sclerotic density in the acromion. MRI of the right shoulder suggest   Sleep apnea 08/12/2016   SMOKER 01/25/2009   Qualifier: Diagnosis of  By: Autry Legions MD, Alveda Aures    Subacromial bursitis 05/17/2013   SUBSTANCE ABUSE 11/06/2009        Thiamin deficiency 10/30/2012   Tobacco abuse 06/09/2010   Visual disturbance 05/03/2013   Vitamin D  deficiency 10/27/2012    Assessment: Patient Reported Symptoms:  Cognitive        Neurological      HEENT        Cardiovascular      Respiratory      Endocrine      Gastrointestinal        Genitourinary      Integumentary      Musculoskeletal          Psychosocial              06/23/2023   11:18 AM  Depression screen PHQ 2/9  Decreased Interest  0  Down, Depressed, Hopeless 0  PHQ - 2 Score 0  Altered sleeping 0  Tired, decreased energy 0  Change in appetite 0  Trouble concentrating 0  Moving slowly or fidgety/restless 0  Suicidal thoughts 0  PHQ-9 Score 0    There were no vitals filed for this visit.  Medications Reviewed Today     Reviewed by Roxie Cord, RN (Registered Nurse) on 06/25/23 at 2303  Med List Status: <None>   Medication Order Taking? Sig Documenting Provider Last Dose Status Informant  AMBULATORY NON FORMULARY MEDICATION [id number]  Walking cane R20.0 R20.2 Ulysees Gander, DO  Active   atorvastatin  (LIPITOR) 20 MG tablet [id number] No Take 1 tablet (20 mg total) by mouth daily. Abram Abraham, NP-C Taking Active            Med Note  Dessa Floss, Horris Speros N   Fri May 14, 2023  4:02 PM) Needs refill  Cholecalciferol  (VITAMIN D3) 50 MCG (2000 UT) capsule [id number] No Take 1 capsule (2,000 Units total) by mouth daily. Abram Abraham, NP-C Taking Active            Med Note Dessa Floss, Jaliel Deavers N   Fri May 14, 2023  4:02 PM) Needs Refill  clonazePAM  (KLONOPIN ) 0.5 MG tablet [id number] No Take 1 tablet (0.5 mg total) by mouth 2 (two) times daily as needed for anxiety. Arturo Late, MD Taking Active   cyanocobalamin  1000 MCG tablet [id number] No Take 1 tablet (1,000 mcg total) by mouth daily. Regalado, Belkys A, MD Taking Active   FLUoxetine  (PROZAC ) 20 MG capsule [id number] No Take 1 capsule (20 mg total) by mouth daily. Arfeen, Syed T, MD Taking Active   gabapentin  (NEURONTIN ) 400 MG capsule [id number] No Take 2 capsules (800 mg total) by mouth 3 (three) times daily. Ulysees Gander, DO Taking Expired 05/10/23 2359   hydrochlorothiazide  (HYDRODIURIL ) 12.5 MG tablet [id number] No TAKE 1 TABLET (12.5 MG TOTAL) BY MOUTH DAILY. Abram Abraham, NP-C Taking Active   Multiple Vitamins-Minerals (BARIATRIC MULTIVITAMINS/IRON ) CAPS [id number] No Take 1 capsule by mouth daily. Abram Abraham, NP-C Taking Active Self, Pharmacy Records           Med Note Nanine Babcock, CHANELLE D   Thu Jan 28, 2023  7:31 AM)    pantoprazole  (PROTONIX ) 40 MG tablet [id number] No Take 1 tablet (40 mg total) by mouth 2 (two) times daily before a meal. Henson, [name], NP-C Taking Active   potassium chloride  SA (KLOR-CON  M) 20 MEQ tablet [id number]  Take 1 tablet (20 mEq total) by mouth daily. Henson, [name], NP-C  Expired 04/29/23 2359   QUEtiapine  (SEROQUEL ) 25 MG tablet [id number] No Take 1 tablet (25 mg total) by mouth 2 (two) times daily.  Patient not taking: Reported on 04/08/2023   Arfeen, Syed T, MD Not Taking Expired 05/17/23 2359   QUEtiapine  (SEROQUEL ) 400 MG tablet [id number] No Take 1 tablet (400 mg total) by mouth at bedtime. Arfeen, Syed T, MD Taking Active    thiamine  (VITAMIN B1) 100 MG tablet [id number] No Take 1 tablet (100 mg total) by mouth daily. Regalado, Belkys A, MD Taking Active   topiramate  (TOPAMAX ) 50 MG tablet [id number] No Take 50 mg by mouth 2 (two) times daily. [provider] Taking Active            Med Note Dessa Floss, Cyerra Yim N   Fri May 14, 2023  4:04 PM) Needs Refills  Recommendation:   {RECOMMENDATONS:32554}  Follow Up Plan:   {FOLLOWUP:32559}  SIG ***

## 2023-06-26 NOTE — Patient Instructions (Signed)
 Thank you for allowing the Complex Care Management team to participate in your care.   Reminders: Please remember to save the number for [facility] and contact them if your appointment ends sooner or later than expected.  We will follow up on Jul 23 2023 at 4pm. Please do not hesitate to contact me if you require assistance prior to our next outreach.    Roxie Cord [facility] Population Health RN Care Manager Direct Dial: [phone]  Fax: [phone] Website: [email or web address]

## 2023-06-28 ENCOUNTER — Other Ambulatory Visit: Payer: Self-pay | Admitting: Sports Medicine

## 2023-06-28 ENCOUNTER — Other Ambulatory Visit: Payer: Self-pay | Admitting: Family Medicine

## 2023-06-28 DIAGNOSIS — R200 Anesthesia of skin: Secondary | ICD-10-CM

## 2023-06-28 DIAGNOSIS — R296 Repeated falls: Secondary | ICD-10-CM

## 2023-06-28 DIAGNOSIS — E876 Hypokalemia: Secondary | ICD-10-CM

## 2023-06-28 DIAGNOSIS — R270 Ataxia, unspecified: Secondary | ICD-10-CM

## 2023-06-28 DIAGNOSIS — E785 Hyperlipidemia, unspecified: Secondary | ICD-10-CM

## 2023-07-02 ENCOUNTER — Ambulatory Visit
Admission: RE | Admit: 2023-07-02 | Discharge: 2023-07-02 | Disposition: A | Discharge status: Home / Self Care | Source: Ambulatory Visit | Attending: Sports Medicine | Admitting: Sports Medicine

## 2023-07-02 DIAGNOSIS — R270 Ataxia, unspecified: Secondary | ICD-10-CM

## 2023-07-02 DIAGNOSIS — R296 Repeated falls: Secondary | ICD-10-CM | POA: Diagnosis not present

## 2023-07-02 DIAGNOSIS — R202 Paresthesia of skin: Secondary | ICD-10-CM

## 2023-07-02 DIAGNOSIS — R200 Anesthesia of skin: Secondary | ICD-10-CM | POA: Diagnosis not present

## 2023-07-02 MED ORDER — GADOPICLENOL 0.5 MMOL/ML IV SOLN
9.0000 mL | Freq: Once | INTRAVENOUS | Status: AC | PRN
Start: 2023-07-02 — End: 2023-07-02
  Administered 2023-07-02: 9 mL via INTRAVENOUS

## 2023-07-06 ENCOUNTER — Ambulatory Visit (INDEPENDENT_AMBULATORY_CARE_PROVIDER_SITE_OTHER): Admitting: Sports Medicine

## 2023-07-06 DIAGNOSIS — M25552 Pain in left hip: Secondary | ICD-10-CM

## 2023-07-06 DIAGNOSIS — M2550 Pain in unspecified joint: Secondary | ICD-10-CM | POA: Diagnosis not present

## 2023-07-06 DIAGNOSIS — R296 Repeated falls: Secondary | ICD-10-CM

## 2023-07-06 DIAGNOSIS — R270 Ataxia, unspecified: Secondary | ICD-10-CM

## 2023-07-06 DIAGNOSIS — R200 Anesthesia of skin: Secondary | ICD-10-CM

## 2023-07-06 DIAGNOSIS — M25551 Pain in right hip: Secondary | ICD-10-CM | POA: Diagnosis not present

## 2023-07-06 DIAGNOSIS — R202 Paresthesia of skin: Secondary | ICD-10-CM

## 2023-07-06 NOTE — Progress Notes (Signed)
 Ben Adonte Vanriper D.[facility] [address] [place] [address] Phone: [phone]    Virtual Visit Note  Assessment and Plan:    1. Numbness and tingling of both lower extremities 2. Numbness and tingling of both upper extremities 3. Polyarthralgia 4. Ataxia 5. Falls frequently 6. Bilateral hip pain -Chronic with exacerbation, subsequent visit - Patient continues to experience numbness and tingling in all extremities that is progressive, becoming more painful, limiting day-to-day activities and ADLs.  I currently have no exhalation for patient's symptoms.  Patient is not diabetic.  Brain MRI, C-spine MRI, lumbar spine MRI have been relatively unremarkable.  Differential includes hypokalemia, thiamine  deficiency, medication side effects, psychosomatic pain - Patient is taking potassium and thiamine  supplementation and most recent lab work showed values within normal limits.  Recommend continuing to follow-up with PCP - Patient has had some symptomatic relief in lower extremities with gabapentin  800 mg 3 times daily.  May continue this medication - Recommend starting physical therapy for overall strength and conditioning to decrease falls - Patient described new lateral hip pain that is keeping her up at night that could be consistent with greater trochanteric bursitis.  Previous hip imaging showed only mild hip arthritis.  Discussed patient could follow-up in clinic if she would like greater trochanteric CSI's, but otherwise could use topical Voltaren gel over areas of pain - I discussed that at this time, unfortunately I do not have any explanation or any recommended further workup for ongoing symptoms.  Patient is encouraged to follow-up with PCP and care management coordinator, Anastacia Karst, to discuss next steps in care which could include continued electrolyte monitoring, neurology referral, or other workup   Pertinent previous records reviewed  include brain MRI 07/02/2023, C-spine MRI 08/30/2022, lumbar spine MRI 08/28/2022, left hip CT 06/11/2023, left knee x-ray 08/17/2017  Follow Up: As needed.  Could discuss gabapentin  refill versus greater trochanteric CSI    I connected with patient by [employer] video enabled telemedicine application and verified that I am speaking with the correct person using two identifiers. Patient agreed to proceed via telephone. I discussed the limitations of evaluation and management by telemedicine and the availability of in person appointments. The patient expressed understanding and agreed to proceed.  Location: Patient: Home setting Provider: In office setting  Time of visit 19 minutes, which included telephone discussion, chart review, treatment plan discussion with patient, and documentation at today's telemedicine visit.    Subjective:    Chief Complaint: right leg pain    HPI:    03/11/23 Patient is a 58 year old female with right leg pain. Patient states she has hx of neuropathy she has been having numbness and tingling in both her legs right is worse than right. Has had this for 2 years. Has been using gabapentin  and that seems to help but sometimes she still has a burning sensation throughout her leg . She has a hard time driving her car she hasn't driven since 09/2022. Antalgic gait she states she is starting to drag her leg from her hip down.  07/06/2023 Patient states that her condition has worsened since her previous office visit.  She states that she is having increased difficulty with ADLs, worsening numbness and tingling in all extremities, bilateral hip pain, bilateral knee pain, bilateral leg pain.  She states gabapentin  does mildly decrease numbness and tingling in bilateral lower extremities, but does not help her hip pain.   Relevant Historical Information: Bipolar 1, vitamin deficiency,  housing insecurity   Additional pertinent review of systems negative.   Current Outpatient  Medications:    AMBULATORY NON FORMULARY MEDICATION, Walking cane R20.0 R20.2, Disp: 1 Units, Rfl: 0   atorvastatin  (LIPITOR) 20 MG tablet, TAKE 1 TABLET (20 MG TOTAL) BY MOUTH DAILY FOR CHOLESTEROL, Disp: 90 tablet, Rfl: 1   Cholecalciferol  (VITAMIN D3) 50 MCG (2000 UT) capsule, Take 1 capsule (2,000 Units total) by mouth daily., Disp: 90 capsule, Rfl: 1   clonazePAM  (KLONOPIN ) 0.5 MG tablet, Take 1 tablet (0.5 mg total) by mouth 2 (two) times daily as needed for anxiety., Disp: 60 tablet, Rfl: 2   cyanocobalamin  1000 MCG tablet, Take 1 tablet (1,000 mcg total) by mouth daily., Disp: 30 tablet, Rfl: 0   FLUoxetine  (PROZAC ) 20 MG capsule, Take 1 capsule (20 mg total) by mouth daily., Disp: 30 capsule, Rfl: 2   gabapentin  (NEURONTIN ) 400 MG capsule, TAKE 2 CAPSULES (800 MG TOTAL) BY MOUTH 3 (THREE) TIMES DAILY., Disp: 180 capsule, Rfl: 1   hydrochlorothiazide  (HYDRODIURIL ) 12.5 MG tablet, TAKE 1 TABLET (12.5 MG TOTAL) BY MOUTH DAILY., Disp: 90 tablet, Rfl: 1   Multiple Vitamins-Minerals (BARIATRIC MULTIVITAMINS/IRON ) CAPS, Take 1 capsule by mouth daily., Disp: 30 capsule, Rfl: 2   pantoprazole  (PROTONIX ) 40 MG tablet, Take 1 tablet (40 mg total) by mouth 2 (two) times daily before a meal., Disp: 180 tablet, Rfl: 1   potassium chloride  SA (KLOR-CON  M) 20 MEQ tablet, TAKE 1 TABLET (20 MEQ TOTAL) BY MOUTH DAILY., Disp: 90 tablet, Rfl: 0   QUEtiapine  (SEROQUEL ) 25 MG tablet, Take 1 tablet (25 mg total) by mouth 2 (two) times daily. (Patient not taking: Reported on 04/08/2023), Disp: 60 tablet, Rfl: 1   QUEtiapine  (SEROQUEL ) 400 MG tablet, Take 1 tablet (400 mg total) by mouth at bedtime., Disp: 30 tablet, Rfl: 2   thiamine  (VITAMIN B1) 100 MG tablet, Take 1 tablet (100 mg total) by mouth daily., Disp: 30 tablet, Rfl: 0   topiramate  (TOPAMAX ) 50 MG tablet, Take 50 mg by mouth 2 (two) times daily., Disp: , Rfl:    Objective:    Alert and doing well.  Very pleasant over the phone  Electronically signed  by:  Marshall Skeeter D.[facility] 9:06 AM 07/06/23

## 2023-07-14 ENCOUNTER — Other Ambulatory Visit: Payer: Self-pay | Admitting: *Deleted

## 2023-07-15 ENCOUNTER — Ambulatory Visit: Admitting: Physical Therapy

## 2023-07-15 NOTE — Patient Instructions (Signed)
 Visit Information  Thank you for taking time to visit with me today. Please don't hesitate to contact me if I can be of assistance to you before our next scheduled appointment.  Your next care management appointment is no further scheduled appointments.   Patient has met all care management goals. Care Management social work case will be closed. Patient has been provided contact information should new needs arise.   Please call the care guide team at [phone] if you need to cancel, schedule, or reschedule an appointment.   Please call the Suicide and Crisis Lifeline: 988 if you are experiencing a Mental Health or Behavioral Health Crisis or need someone to talk to.  Twinkle Sockwell, LCSW [place]  [facility], [facility] Licensed Clinical Social Worker Care Coordinator  Direct Dial: [phone]

## 2023-07-15 NOTE — Patient Outreach (Addendum)
 Complex Care Management   Visit Note  07/15/2023 late entry  Name:  Shannon [name] MRN: [id number] DOB: 09/07/1965  Situation: Referral received for Complex Care Management related to Mental/Behavioral Health diagnosis Bi-polar disorder, housing insecurity I obtained verbal consent from Patient.  Visit completed with patient  on the phone on 07/14/23  Background:   Past Medical History:  Diagnosis Date   Allergic rhinitis    Allergic rhinitis 01/25/2009        ANEMIA-IRON  DEFICIENCY 01/25/2009   Anxiety    Backache 01/25/2009   Qualifier: Diagnosis of  By: Autry Legions MD, Alveda Aures    Bipolar 1 disorder [facility])    Chronic pain syndrome    Complete rotator cuff tear 07/21/2013   Complete tear of right rotator cuff 07/21/2013   Contact lens/glasses fitting    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   DISC DISEASE, LUMBAR 01/25/2009   Essential hypertension 01/25/2009   On Bystolic     GAD (generalized anxiety disorder) 12/28/2013   GERD (gastroesophageal reflux disease)    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   Headache, acute 12/20/2013   Hx of laparoscopic gastric banding 09/03/2010   Surgery date: 07/17/10    HYPERLIPIDEMIA 01/25/2009   Hyperlipidemia with target LDL less than 130 01/25/2009   HYPERTENSION 01/25/2009   Hypokalemia, inadequate intake 09/01/2011   Insomnia 04/04/2011   INSOMNIA-SLEEP DISORDER-UNSPEC 04/30/2009   Qualifier: Diagnosis of  By: Autry Legions MD, Alveda Aures    Iron  deficiency anemia 01/25/2009        Labyrinthitis 02/19/2014   Leiomyoma of uterus, unspecified 08/13/2008   Overview:  Leiomyoma Of The Uterus  10/1 IMO update   Localized edema 06/12/2015   MANIC DEPRESSIVE ILLNESS 01/25/2009   pt is unsue if this is her specifc dx   Mixed bipolar I disorder (HCC) 07/12/2012   Morbid obesity (HCC) 01/25/2009   Night sweats    Osteopenia determined by x-ray 06/13/2021   Other abnormal glucose 10/26/2012   Palpitations 10/25/2013   PEPTIC ULCER DISEASE, HELICOBACTER PYLORI POSITIVE  10/03/2009   Peripheral edema 06/25/2015   Pernicious anemia 03/28/2012   Piriformis syndrome of both sides 09/02/2018   PUD (peptic ulcer disease) 10/03/2009        Right shoulder pain 05/03/2013   Dg Shoulder Right  05/03/2013   CLINICAL DATA Pain.  EXAM RIGHT SHOULDER - 2+ VIEW  COMPARISON None.  FINDINGS Acromioclavicular and glenohumeral degenerative change present. Questionable calcific density noted in the region of the supraspinatus space, possibly representing calcific supraspinatus tendinitis. This could represent a sclerotic density in the acromion. MRI of the right shoulder suggest   Sleep apnea 08/12/2016   SMOKER 01/25/2009   Qualifier: Diagnosis of  By: Autry Legions MD, Alveda Aures    Subacromial bursitis 05/17/2013   SUBSTANCE ABUSE 11/06/2009        Thiamin deficiency 10/30/2012   Tobacco abuse 06/09/2010   Visual disturbance 05/03/2013   Vitamin D  deficiency 10/27/2012    Assessment: Patient Reported Symptoms:  Cognitive Cognitive Status: Alert and oriented to person, place, and time, Normal speech and language skills Cognitive/Intellectual Conditions Management [RPT]: None reported or documented in medical history or problem list      Neurological   Neurological Comment: [facility] appt scheudled for 5/22 2pm  HEENT HEENT Symptoms Reported: No symptoms reported      Cardiovascular   Cardiovascular Comment: chronic swelling now managed, currently on medication  Respiratory Respiratory Symptoms Reported: No symptoms reported    Endocrine Patient reports  the following symptoms related to hypoglycemia or hyperglycemia : No symptoms reported    Gastrointestinal Gastrointestinal Symptoms Reported: No symptoms reported      Genitourinary Genitourinary Symptoms Reported: No symptoms reported    Integumentary Integumentary Symptoms Reported: No symptoms reported    Musculoskeletal          Psychosocial Psychosocial Symptoms Reported: No symptoms reported Additional  Psychological Details: patient states that her mood continues to improve-confirms taking medications as prescribed Behavioral Health Conditions: Depression, Bipolar affective disorder Behavioral Management Strategies: Coping strategies, Counseling, Medication therapy Behavioral Health Comment: follow appointment 5/9 will follow up in three months          07/14/2023    2:37 PM  Depression screen PHQ 2/9  Decreased Interest 0  Down, Depressed, Hopeless 0  PHQ - 2 Score 0    There were no vitals filed for this visit.  Medications Reviewed Today   Medications were not reviewed in this encounter     Recommendation:   PCP Follow-up Continued follow up with mental health provider  Follow Up Plan:   Patient has met all care management goals. Care Management case will be closed. Patient has been provided contact information should new needs arise.   Christine Schiefelbein, LCSW [place]  [facility], [facility] Licensed Clinical Social Worker Care Coordinator  Direct Dial: [phone]

## 2023-07-23 ENCOUNTER — Telehealth: Payer: Self-pay

## 2023-07-27 ENCOUNTER — Other Ambulatory Visit (HOSPITAL_COMMUNITY): Payer: Self-pay | Admitting: Psychiatry

## 2023-07-27 ENCOUNTER — Ambulatory Visit: Attending: Sports Medicine | Admitting: Physical Therapy

## 2023-07-27 DIAGNOSIS — F411 Generalized anxiety disorder: Secondary | ICD-10-CM

## 2023-07-27 DIAGNOSIS — F319 Bipolar disorder, unspecified: Secondary | ICD-10-CM

## 2023-07-27 DIAGNOSIS — F4310 Post-traumatic stress disorder, unspecified: Secondary | ICD-10-CM

## 2023-07-28 ENCOUNTER — Ambulatory Visit: Payer: Self-pay

## 2023-07-28 NOTE — Telephone Encounter (Addendum)
             FYI Only or Action Required?: FYI only for provider  Patient was last seen in primary care on 03/10/2023 by Alyson Back L, NP-C. Called Nurse Triage reporting Hip Pain. Symptoms began several months ago. Interventions attempted: Rest, hydration, or home remedies. Symptoms are: gradually worsening.  Triage Disposition: See Physician Within 24 Hours (overriding See HCP Within 4 Hours (Or PCP Triage))  Patient/caregiver understands and will follow disposition?: Yes   **Notify pt through MyChart only     Copied from CRM [id number]. Topic: Clinical - Red Word Triage >> Jul 28, 2023  2:43 PM Fonda T wrote: Red Word that prompted transfer to Nurse Triage: Patient calling states she is having unbearable hip pain, recent increase of falling, difficulty walking, and swelling and numbness in lower and upper extremities Reason for Disposition  [1] SEVERE pain (e.g., excruciating, unable to do any normal activities) AND [2] not improved after 2 hours of pain medicine  Answer Assessment - Initial Assessment Questions 1. LOCATION and RADIATION: "Where is the pain located?"      Both hips  2. QUALITY: "What does the pain feel like?"  (e.g., sharp, dull, aching, burning)     Burning and jabbing 3. SEVERITY: "How bad is the pain?" "What does it keep you from doing?"   (Scale 1-10; or mild, moderate, severe)   -  MILD (1-3): doesn't interfere with normal activities    -  MODERATE (4-7): interferes with normal activities (e.g., work or school) or awakens from sleep, limping    -  SEVERE (8-10): excruciating pain, unable to do any normal activities, unable to walk     9/10 4. ONSET: "When did the pain start?" "Does it come and go, or is it there all the time?"     2 months -comes and goes- pain wakes her up 5. WORK OR EXERCISE: "Has there been any recent work or exercise that involved this part of the body?"      *No Answer* 6. CAUSE: "What do you think is causing the hip pain?"       neuropathy 7. AGGRAVATING FACTORS: "What makes the hip pain worse?" (e.g., walking, climbing stairs, running)     Laying sitting walking make the pain worse. Other times pain is always there but varies 8. OTHER SYMPTOMS: "Do you have any other symptoms?" (e.g., back pain, pain shooting down leg,  fever, rash)     Hand pain increased  - numbness to bottom of both feet-gabapentin  not alleviating pain  Protocols used: Hip Pain-A-AH

## 2023-07-29 ENCOUNTER — Telehealth (INDEPENDENT_AMBULATORY_CARE_PROVIDER_SITE_OTHER): Admitting: Internal Medicine

## 2023-07-29 ENCOUNTER — Encounter: Payer: Self-pay | Admitting: Internal Medicine

## 2023-07-29 DIAGNOSIS — G9009 Other idiopathic peripheral autonomic neuropathy: Secondary | ICD-10-CM | POA: Diagnosis not present

## 2023-07-29 DIAGNOSIS — E785 Hyperlipidemia, unspecified: Secondary | ICD-10-CM

## 2023-07-29 DIAGNOSIS — F411 Generalized anxiety disorder: Secondary | ICD-10-CM

## 2023-07-29 DIAGNOSIS — I10 Essential (primary) hypertension: Secondary | ICD-10-CM | POA: Diagnosis not present

## 2023-07-29 DIAGNOSIS — F319 Bipolar disorder, unspecified: Secondary | ICD-10-CM

## 2023-07-29 DIAGNOSIS — F4310 Post-traumatic stress disorder, unspecified: Secondary | ICD-10-CM

## 2023-07-29 DIAGNOSIS — F1911 Other psychoactive substance abuse, in remission: Secondary | ICD-10-CM

## 2023-07-29 DIAGNOSIS — E519 Thiamine deficiency, unspecified: Secondary | ICD-10-CM | POA: Diagnosis not present

## 2023-07-29 DIAGNOSIS — F3175 Bipolar disorder, in partial remission, most recent episode depressed: Secondary | ICD-10-CM

## 2023-07-29 MED ORDER — TRAMADOL HCL 50 MG PO TABS: 50.0000 mg | ORAL_TABLET | Freq: Four times a day (QID) | ORAL | 1 refills | Status: DC | PRN

## 2023-07-29 MED ORDER — HYDROCHLOROTHIAZIDE 12.5 MG PO TABS: 12.5000 mg | ORAL_TABLET | Freq: Every day | ORAL | 1 refills | Status: DC

## 2023-07-29 MED ORDER — QUETIAPINE FUMARATE 400 MG PO TABS: 400.0000 mg | ORAL_TABLET | Freq: Every day | ORAL | 2 refills | Status: DC

## 2023-07-29 MED ORDER — ATORVASTATIN CALCIUM 20 MG PO TABS
20.0000 mg | ORAL_TABLET | Freq: Every day | ORAL | 1 refills | Status: DC
Start: 2023-07-29 — End: 2023-11-29

## 2023-07-29 NOTE — Progress Notes (Signed)
 Virtual Visit via Video Note  I connected with Shannon [name] on 08/09/23 at  8:10 AM EDT by a video enabled telemedicine application and verified that I am speaking with the correct person using two identifiers.   I discussed the limitations of evaluation and management by telemedicine and the availability of in person appointments. The patient expressed understanding and agreed to proceed.  I was located at our [facility] office. The patient was at home. There was no one else present in the visit.  Chief Complaint  Patient presents with   Hip Pain    Pt has stated she is taking Neuropathy medication which helps with the neuropathy pain from her knees and below... However, it does not help with what he does suspect is neuropathy pain from the knees above a/w her hands.     History of Present Illness: C/o severe pain in B feet, legs and hands.  Nothing else.  Very uncomfortable. C/o chronic neuropathy.  Very difficult to ambulate.  States nothing helps so far  Review of Systems  Constitutional:  Positive for malaise/fatigue. Negative for diaphoresis, fever and weight loss.  HENT:  Negative for sore throat.   Eyes:  Negative for pain.  Respiratory:  Negative for cough.   Cardiovascular:  Negative for chest pain and orthopnea.  Gastrointestinal:  Negative for diarrhea.  Genitourinary:  Negative for dysuria.  Musculoskeletal:  Positive for back pain, falls and myalgias.  Neurological:  Positive for sensory change and weakness. Negative for seizures and loss of consciousness.  Endo/Heme/Allergies:  Does not bruise/bleed easily.  Psychiatric/Behavioral:  Positive for depression. Negative for memory loss and substance abuse. The patient is not nervous/anxious.      Observations/Objective: The patient appears to be in no acute distress  Assessment and Plan:  Problem List Items Addressed This Visit     Hyperlipidemia with target LDL less than 130   Relevant Medications    atorvastatin  (LIPITOR) 20 MG tablet   hydrochlorothiazide  (HYDRODIURIL ) 12.5 MG tablet   MANIC DEPRESSIVE ILLNESS   Follow-up with Dr. Carlos Chesterfield Meds renewed      Essential hypertension (Chronic)   Will renew the HCTZ      Relevant Medications   atorvastatin  (LIPITOR) 20 MG tablet   hydrochlorothiazide  (HYDRODIURIL ) 12.5 MG tablet   Thiamin deficiency - Primary   Take thiamine       History of substance abuse (HCC)   Will use tramadol  limited prescription with question for peripheral neuropathy flareup at present      Other idiopathic peripheral autonomic neuropathy   Will use tramadol  limited prescription with question for peripheral neuropathy flareup at present Home PT was offered      Other Visit Diagnoses       PTSD (post-traumatic stress disorder)         Bipolar I disorder (HCC)         GAD (generalized anxiety disorder)            Meds ordered this encounter  Medications   atorvastatin  (LIPITOR) 20 MG tablet    Sig: Take 1 tablet (20 mg total) by mouth daily.    Dispense:  90 tablet    Refill:  1   hydrochlorothiazide  (HYDRODIURIL ) 12.5 MG tablet    Sig: Take 1 tablet (12.5 mg total) by mouth daily.    Dispense:  90 tablet    Refill:  1   DISCONTD: QUEtiapine  (SEROQUEL ) 400 MG tablet    Sig: Take 1 tablet (400 mg total) by  mouth at bedtime.    Dispense:  30 tablet    Refill:  2   traMADol  (ULTRAM ) 50 MG tablet    Sig: Take 1 tablet (50 mg total) by mouth every 6 (six) hours as needed for severe pain (pain score 7-10).    Dispense:  20 tablet    Refill:  1     Follow Up Instructions:    I discussed the assessment and treatment plan with the patient. The patient was provided an opportunity to ask questions and all were answered. The patient agreed with the plan and demonstrated an understanding of the instructions.   The patient was advised to call back or seek an in-person evaluation if the symptoms worsen or if the condition fails to improve as  anticipated.  I provided face-to-face time during this encounter. We were at different locations.   Anitra Barn, MD

## 2023-08-02 ENCOUNTER — Telehealth (HOSPITAL_COMMUNITY): Admitting: Psychiatry

## 2023-08-02 ENCOUNTER — Telehealth (HOSPITAL_BASED_OUTPATIENT_CLINIC_OR_DEPARTMENT_OTHER): Admitting: Psychiatry

## 2023-08-02 ENCOUNTER — Encounter (HOSPITAL_COMMUNITY): Payer: Self-pay | Admitting: Psychiatry

## 2023-08-02 DIAGNOSIS — F4310 Post-traumatic stress disorder, unspecified: Secondary | ICD-10-CM | POA: Diagnosis not present

## 2023-08-02 DIAGNOSIS — F411 Generalized anxiety disorder: Secondary | ICD-10-CM

## 2023-08-02 DIAGNOSIS — F319 Bipolar disorder, unspecified: Secondary | ICD-10-CM

## 2023-08-02 MED ORDER — QUETIAPINE FUMARATE 400 MG PO TABS: 400.0000 mg | ORAL_TABLET | Freq: Every day | ORAL | 1 refills | Status: DC

## 2023-08-02 MED ORDER — CLONAZEPAM 0.5 MG PO TABS
0.5000 mg | ORAL_TABLET | Freq: Three times a day (TID) | ORAL | 1 refills | Status: DC
Start: 2023-08-02 — End: 2023-09-14
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
## 2023-08-09 ENCOUNTER — Encounter: Payer: Self-pay | Admitting: Internal Medicine

## 2023-08-09 DIAGNOSIS — G9009 Other idiopathic peripheral autonomic neuropathy: Secondary | ICD-10-CM | POA: Insufficient documentation

## 2023-08-09 DIAGNOSIS — F1911 Other psychoactive substance abuse, in remission: Secondary | ICD-10-CM | POA: Insufficient documentation

## 2023-08-09 NOTE — Assessment & Plan Note (Signed)
 Take thiamine 

## 2023-08-09 NOTE — Assessment & Plan Note (Signed)
 Will use tramadol  limited prescription with question for peripheral neuropathy flareup at present

## 2023-08-09 NOTE — Assessment & Plan Note (Signed)
 Will renew the HCTZ

## 2023-08-09 NOTE — Assessment & Plan Note (Addendum)
 Will use tramadol  limited prescription with question for peripheral neuropathy flareup at present Home PT was offered

## 2023-08-09 NOTE — Assessment & Plan Note (Signed)
 Follow-up with Dr. Carlos Chesterfield Meds renewed

## 2023-08-25 ENCOUNTER — Telehealth: Payer: Self-pay

## 2023-08-26 ENCOUNTER — Telehealth: Admitting: Family Medicine

## 2023-09-14 ENCOUNTER — Telehealth (HOSPITAL_BASED_OUTPATIENT_CLINIC_OR_DEPARTMENT_OTHER): Payer: Self-pay | Admitting: Psychiatry

## 2023-09-14 ENCOUNTER — Encounter (HOSPITAL_COMMUNITY): Payer: Self-pay | Admitting: Psychiatry

## 2023-09-14 ENCOUNTER — Telehealth (HOSPITAL_BASED_OUTPATIENT_CLINIC_OR_DEPARTMENT_OTHER): Admitting: Psychiatry

## 2023-09-14 ENCOUNTER — Encounter (HOSPITAL_COMMUNITY): Payer: Self-pay

## 2023-09-14 VITALS — Wt 201.0 lb

## 2023-09-14 DIAGNOSIS — F4310 Post-traumatic stress disorder, unspecified: Secondary | ICD-10-CM | POA: Diagnosis not present

## 2023-09-14 DIAGNOSIS — F411 Generalized anxiety disorder: Secondary | ICD-10-CM | POA: Diagnosis not present

## 2023-09-14 DIAGNOSIS — F609 Personality disorder, unspecified: Secondary | ICD-10-CM | POA: Diagnosis not present

## 2023-09-14 DIAGNOSIS — Z91199 Patient's noncompliance with other medical treatment and regimen due to unspecified reason: Secondary | ICD-10-CM

## 2023-09-14 DIAGNOSIS — F319 Bipolar disorder, unspecified: Secondary | ICD-10-CM | POA: Diagnosis not present

## 2023-09-14 MED ORDER — CLONAZEPAM 0.5 MG PO TABS: 0.5000 mg | ORAL_TABLET | Freq: Three times a day (TID) | ORAL | 2 refills | Status: DC

## 2023-09-14 MED ORDER — FLUOXETINE HCL 20 MG PO CAPS: 20.0000 mg | ORAL_CAPSULE | Freq: Every day | ORAL | 2 refills | Status: DC

## 2023-09-14 MED ORDER — QUETIAPINE FUMARATE 400 MG PO TABS: 400.0000 mg | ORAL_TABLET | Freq: Every day | ORAL | 2 refills | Status: DC

## 2023-09-14 NOTE — Progress Notes (Signed)
 Patient is not on video platform.  Called twice at her cell number but no one pick up the phone.  Not able to leave a message as voicemail is not set up.  Patient is no-show today.
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
## 2023-09-21 ENCOUNTER — Other Ambulatory Visit: Payer: Self-pay

## 2023-09-21 NOTE — Patient Instructions (Signed)
 Visit Information  Thank you for taking time to visit with me today. Please don't hesitate to contact me if I can be of assistance to you before our next scheduled appointment.  Our next appointment is by telephone on 09/30/2023 at 10am Please call the care guide team at [phone] if you need to cancel or reschedule your appointment.    Please call the Suicide and Crisis Lifeline: 988 call the [place]  National Suicide Prevention Lifeline: 507-495-7478 or TTY: 671-661-2458 TTY (938)620-7425) to talk to a trained counselor call 1-800-273-TALK (toll free, 24 hour hotline) go to [facility] [address], [place] [phone]) call 911 if you are experiencing a Mental Health or Behavioral Health Crisis or need someone to talk to.  Patient verbalizes understanding of instructions and care plan provided today and agrees to view in MyChart. Active MyChart status and patient understanding of how to access instructions and care plan via MyChart confirmed with patient.    Orlean Fey, BSW [place]  [facility] Social Worker, [facility] [phone]

## 2023-09-21 NOTE — Patient Outreach (Signed)
 Complex Care Management   Visit Note  09/21/2023  Name:  Shannon [name] MRN: [id number] DOB: 1965-04-26  Situation: Referral received for Complex Care Management related to SDOH Barriers:  Housing   I obtained verbal consent from Patient.  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   DISC DISEASE, LUMBAR 01/25/2009   Essential hypertension 01/25/2009   On Bystolic     GAD (generalized anxiety disorder) 12/28/2013   GERD (gastroesophageal reflux disease)    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   Headache, acute 12/20/2013   Hx of laparoscopic gastric banding 09/03/2010   Surgery date: 07/17/10    HYPERLIPIDEMIA 01/25/2009   Hyperlipidemia with target LDL less than 130 01/25/2009   HYPERTENSION 01/25/2009   Hypokalemia, inadequate intake 09/01/2011   Insomnia 04/04/2011   INSOMNIA-SLEEP DISORDER-UNSPEC 04/30/2009   Qualifier: Diagnosis of  By: Norleen MD, [place] ORN    Iron  deficiency anemia 01/25/2009        Labyrinthitis 02/19/2014   Leiomyoma of uterus, unspecified 08/13/2008   Overview:  Leiomyoma Of The Uterus  10/1 IMO update   Localized edema 06/12/2015   MANIC DEPRESSIVE ILLNESS 01/25/2009   pt is unsue if this is her specifc dx   Mixed bipolar I disorder (HCC) 07/12/2012   Morbid obesity (HCC) 01/25/2009   Night sweats    Osteopenia determined by x-ray 06/13/2021   Other abnormal glucose 10/26/2012   Palpitations 10/25/2013   PEPTIC ULCER DISEASE, HELICOBACTER PYLORI POSITIVE 10/03/2009   Peripheral edema 06/25/2015   Pernicious anemia 03/28/2012    Piriformis syndrome of both sides 09/02/2018   PUD (peptic ulcer disease) 10/03/2009        Right shoulder pain 05/03/2013   Dg Shoulder Right  05/03/2013   CLINICAL DATA Pain.  EXAM RIGHT SHOULDER - 2+ VIEW  COMPARISON None.  FINDINGS Acromioclavicular and glenohumeral degenerative change present. Questionable calcific density noted in the region of the supraspinatus space, possibly representing calcific supraspinatus tendinitis. This could represent a sclerotic density in the acromion. MRI of the right shoulder suggest   Sleep apnea 08/12/2016   SMOKER 01/25/2009   Qualifier: Diagnosis of  By: Norleen MD, [place] ORN    Subacromial bursitis 05/17/2013   SUBSTANCE ABUSE 11/06/2009        Thiamin deficiency 10/30/2012   Tobacco abuse 06/09/2010   Visual disturbance 05/03/2013   Vitamin D  deficiency 10/27/2012    Assessment: BSW contacted Shannon [name] to assess for SDOH barriers. During the assessment, BSW identified housing as the primary concern. Patient reported currently residing in unsafe living conditions, citing a severe infestation of rats and roaches. She shared that an Midwife had recently evaluated the property and she believes it may soon be deemed uninhabitable.Patient expressed a desire to relocate and had already contacted the [employer]; however, she was informed that the waitlist is approximately two years. BSW suggested the [employer] and [employer] as an alternative housing option. Patient expressed interest and agreed to  pursue this route.  BSW encouraged patient to initiate contact with the [facility] agency to be placed on the waiting list. Additionally, BSW followed up by leaving multiple voicemails with the [employer] and [employer] and is currently awaiting a response.  BSW will follow up with Shannon [name] on 09/30/2023 to assess progress and provide further support as needed.  SDOH Interventions Today    Flowsheet Row Most Recent Value   SDOH Interventions   Housing Interventions Community Resources Provided  Financial Strain Interventions Community Resources Provided        Recommendation:   No recommendations at this time.  Follow Up Plan:   Telephone follow-up 09/30/2023   Orlean Fey, BSW [place]  [facility] Social Worker, [facility] [phone]

## 2023-09-23 ENCOUNTER — Ambulatory Visit (HOSPITAL_COMMUNITY): Payer: Self-pay | Admitting: Behavioral Health
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
## 2023-09-30 ENCOUNTER — Other Ambulatory Visit: Payer: Self-pay

## 2023-09-30 DIAGNOSIS — M47817 Spondylosis without myelopathy or radiculopathy, lumbosacral region: Secondary | ICD-10-CM | POA: Diagnosis not present

## 2023-09-30 NOTE — Patient Instructions (Signed)
 Visit Information  Thank you for taking time to visit with me today. Please don't hesitate to contact me if I can be of assistance to you before our next scheduled appointment.  Your next care management appointment is no further scheduled appointments.    Telephone follow-up when caseworker gets back in contact  Please call the care guide team at [phone] if you need to cancel, schedule, or reschedule an appointment.   Please call the Suicide and Crisis Lifeline: 988 call the [place]  National Suicide Prevention Lifeline: 928-328-0585 or TTY: 971-196-2642 TTY 604-181-6138) to talk to a trained counselor call 1-800-273-TALK (toll free, 24 hour hotline) call 911 if you are experiencing a Mental Health or Behavioral Health Crisis or need someone to talk to.  Orlean Fey, BSW [place]  [facility] Social Worker, [facility] [phone]
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
## 2023-10-11 ENCOUNTER — Ambulatory Visit: Payer: Self-pay

## 2023-10-11 ENCOUNTER — Telehealth: Payer: Self-pay

## 2023-10-11 DIAGNOSIS — R1310 Dysphagia, unspecified: Secondary | ICD-10-CM

## 2023-10-11 MED ORDER — PANTOPRAZOLE SODIUM 40 MG PO TBEC
40.0000 mg | DELAYED_RELEASE_TABLET | Freq: Two times a day (BID) | ORAL | 1 refills | Status: DC
Start: 2023-10-11 — End: 2023-10-11

## 2023-10-11 MED ORDER — PANTOPRAZOLE SODIUM 40 MG PO TBEC: 40.0000 mg | DELAYED_RELEASE_TABLET | Freq: Two times a day (BID) | ORAL | 1 refills | Status: AC

## 2023-10-11 NOTE — Telephone Encounter (Signed)
 FYI Only or Action Required?: Action required by provider: medication refill request.  Patient was last seen in primary care on 07/29/2023 by Plotnikov, Karlynn GAILS, MD.  Called Nurse Triage reporting Heartburn and Medication Refill.  Symptoms began several days ago.  Interventions attempted: Nothing.  Symptoms are: stable.  Triage Disposition: Home Care  Patient/caregiver understands and will follow disposition?: Yes  Copied from CRM #[id number]. Topic: Clinical - Red Word Triage >> Oct 11, 2023  9:58 AM Jayma L wrote: Red Word that prompted transfer to Nurse Triage:  non- stop heartburn that's severe , said she has chest discomfort and its nonstop. Needs a refill but unsure of the name of medicine. Said it is to help with heartburn Reason for Disposition  [1] Caller requesting a prescription renewal (no refills left), no triage required, AND [2] triager able to renew prescription per department policy  Answer Assessment - Initial Assessment Questions 1. DRUG NAME: What medicine do you need to have refilled?     Pantoprazole   2. REFILLS REMAINING: How many refills are remaining? Notes: The label on the medicine or pill bottle will show how many refills are remaining. If there are no refills remaining, then a renewal may be needed.     1 3. EXPIRATION DATE: What is the expiration date? Note: The label states when the prescription will expire, and thus can no longer be refilled.)     03/14/24  4. PRESCRIBER: Who prescribed it? Note: The prescribing doctor or group is responsible for refill approvals.SABRA Nordmann, NP  5. PHARMACY: Have you contacted your pharmacy (drugstore)? Note: Some pharmacies will contact the doctor (or NP/PA).      No  6. SYMPTOMS: Do you have any symptoms?     Heartburn  7. PREGNANCY: Is there any chance that you are pregnant? When was your last menstrual period?     No  Protocols used: Medication Refill and Renewal Call-A-AH

## 2023-10-11 NOTE — Telephone Encounter (Signed)
Rx sent to [facility] as requested

## 2023-10-11 NOTE — Telephone Encounter (Signed)
 Copied from CRM #[id number]. Topic: General - Other >> Oct 11, 2023 12:04 PM Thersia C wrote: Reason for CRM: Patient called in regarding her having neuropathy and getting the controlled brakes and gas to be installed in her car, would need NP [name] or her nurses to give her a callback regarding this so it can be sent over so she can get those installed in car

## 2023-10-12 ENCOUNTER — Other Ambulatory Visit: Payer: Self-pay | Admitting: Family Medicine

## 2023-10-12 DIAGNOSIS — R1310 Dysphagia, unspecified: Secondary | ICD-10-CM

## 2023-10-12 NOTE — Telephone Encounter (Signed)
 Copied from CRM #[id number]. Topic: Clinical - Medication Refill >> Oct 12, 2023  8:41 AM Vena HERO wrote: Medication: pantoprazole  (PROTONIX ) 40 MG tablet  Has the patient contacted their pharmacy? Yes (Agent: If no, request that the patient contact the pharmacy for the refill. If patient does not wish to contact the pharmacy document the reason why and proceed with request.) (Agent: If yes, when and what did the pharmacy advise?) Pharmacy stated to contact pcp to get a refill  This is the patient's preferred pharmacy:  [id number] - [place], [place] - [address] AT [facility] [address] [[id number]]  Is this the correct pharmacy for this prescription? Yes If no, delete pharmacy and type the correct one.   Has the prescription been filled recently? No  Is the patient out of the medication? Yes  Has the patient been seen for an appointment in the last year OR does the patient have an upcoming appointment? Yes  Can we respond through MyChart? Yes  Agent: Please be advised that Rx refills may take up to 3 business days. We ask that you follow-up with your pharmacy.

## 2023-10-12 NOTE — Telephone Encounter (Signed)
 Called pt regarding requests below, pt states she spoke w her insurance and they will cover for her to have hand controlled breaks and gas installments due to her neuropathy as long as [name] writes a letter for medical necessity. I advised pt [name] will do this, however she will need an appointment to be seen and please bring all necessary information (such as fax number) for us  to send this off to. We can also provide a copy for her as well. Pt scheduled next available appt for tomorrow but needs to find transportation so she may call back to r/s if she can't find anyone.   I also called and spoke w [facility] and confirmed they got the Protonix  prescription that was sent yesterday. They stated pt called and had rx moved to the [place], [place] location yesterday but if she wants it to go back to them she will have to call them and switch.   I called pt back and informed her of what [facility] told me about her calling in yesterday and switching the location, but if she needs it back at the original location she will have to call them and let them know. Pt verbalized understanding.

## 2023-10-13 ENCOUNTER — Other Ambulatory Visit: Payer: Self-pay

## 2023-10-13 ENCOUNTER — Ambulatory Visit: Admitting: Family Medicine

## 2023-10-15 NOTE — Patient Outreach (Signed)
 Complex Care Management   Visit Note    Name:  Shannon [name] MRN: [id number] DOB: 07-10-1965  Situation: Referral received for Complex Care Management related to Asthma and Peripheral Neuropathy. I obtained verbal consent from Patient.  Visit completed with Ms. Schall via telephone.  Background:   Past Medical History:  Diagnosis Date   Allergic rhinitis    Allergic rhinitis 01/25/2009        ANEMIA-IRON  DEFICIENCY 01/25/2009   Anxiety    Backache 01/25/2009   Qualifier: Diagnosis of  By: Norleen MD, Lynwood ORN    Bipolar 1 disorder [facility])    Chronic pain syndrome    Complete rotator cuff tear 07/21/2013   Complete tear of right rotator cuff 07/21/2013   Contact lens/glasses fitting    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   DISC DISEASE, LUMBAR 01/25/2009   Essential hypertension 01/25/2009   On Bystolic     GAD (generalized anxiety disorder) 12/28/2013   GERD (gastroesophageal reflux disease)    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   Headache, acute 12/20/2013   Hx of laparoscopic gastric banding 09/03/2010   Surgery date: 07/17/10    HYPERLIPIDEMIA 01/25/2009   Hyperlipidemia with target LDL less than 130 01/25/2009   HYPERTENSION 01/25/2009   Hypokalemia, inadequate intake 09/01/2011   Insomnia 04/04/2011   INSOMNIA-SLEEP DISORDER-UNSPEC 04/30/2009   Qualifier: Diagnosis of  By: Norleen MD, Lynwood ORN    Iron  deficiency anemia 01/25/2009        Labyrinthitis 02/19/2014   Leiomyoma of uterus, unspecified 08/13/2008   Overview:  Leiomyoma Of The Uterus  10/1 IMO update   Localized edema 06/12/2015   MANIC DEPRESSIVE ILLNESS 01/25/2009   pt is unsue if this is her specifc dx   Mixed bipolar I disorder (HCC) 07/12/2012   Morbid obesity (HCC) 01/25/2009   Night sweats    Osteopenia determined by x-ray 06/13/2021   Other abnormal glucose 10/26/2012   Palpitations 10/25/2013   PEPTIC ULCER DISEASE, HELICOBACTER PYLORI POSITIVE 10/03/2009   Peripheral edema 06/25/2015   Pernicious anemia 03/28/2012    Piriformis syndrome of both sides 09/02/2018   PUD (peptic ulcer disease) 10/03/2009        Right shoulder pain 05/03/2013   Dg Shoulder Right  05/03/2013   CLINICAL DATA Pain.  EXAM RIGHT SHOULDER - 2+ VIEW  COMPARISON None.  FINDINGS Acromioclavicular and glenohumeral degenerative change present. Questionable calcific density noted in the region of the supraspinatus space, possibly representing calcific supraspinatus tendinitis. This could represent a sclerotic density in the acromion. MRI of the right shoulder suggest   Sleep apnea 08/12/2016   SMOKER 01/25/2009   Qualifier: Diagnosis of  By: Norleen MD, Lynwood ORN    Subacromial bursitis 05/17/2013   SUBSTANCE ABUSE 11/06/2009        Thiamin deficiency 10/30/2012   Tobacco abuse 06/09/2010   Visual disturbance 05/03/2013   Vitamin D  deficiency 10/27/2012    Assessment: Patient Reported Symptoms: Cognitive Cognitive Status: Alert and oriented to person, place, and time, Normal speech and language skills Cognitive/Intellectual Conditions Management [RPT]: None reported or documented in medical history or problem list Health Maintenance Behaviors: Annual physical exam, Spiritual practice(s), Stress management Healing Pattern: Unsure Health Facilitated by: Stress management, Pain control, Prayer/meditation  Neurological Neurological Review of Symptoms: Other: [place] Neurological Symptoms/Conditions [RPT]: Reports peripheral neuropathy which has been a chronic issue. Will followup with PCP this month. Neurological Management Strategies: Routine screening, Medication therapy, Medical device, Adequate rest Neurological Self-Management Outcome: 4 (good)  HEENT HEENT Symptoms  Reported: No symptoms reported HEENT Management Strategies: Routine screening HEENT Self-Management Outcome: 4 (good)  Cardiovascular Cardiovascular Symptoms Reported: Swelling in legs or feet (Reports chronic swelling in lower extremities. Reports swelling has improved. Pending evaluation  with provider on August) Does patient have uncontrolled Hypertension?: No  Respiratory Respiratory Symptoms Reported: No symptoms reported Respiratory Self-Management Outcome: 4 (good)  Endocrine Endocrine Symptoms Reported: No symptoms reported Is patient diabetic?: No Endocrine Self-Management Outcome: 4 (good)  Gastrointestinal Gastrointestinal Symptoms Reported: No symptoms reported Gastrointestinal Self-Management Outcome: 4 (good)  Genitourinary Genitourinary Symptoms Reported: No symptoms reported Genitourinary Self-Management Outcome: 4 (good)  Integumentary Integumentary Symptoms Reported: No symptoms reported Skin Management Strategies: Routine screening Skin Self-Management Outcome: 4 (good)  Musculoskeletal Musculoskelatal Symptoms Reviewed: Unsteady gait Additional Musculoskeletal Details: Uses walker with ambulation. Musculoskeletal Management Strategies: Medication therapy, Medical device, Routine screening, Coping strategies Falls in the past year?: Yes Number of falls in past year: 2 or more Was there an injury with Fall?: Yes Fall Risk Category Calculator: 3 Patient Fall Risk Level: High Fall Risk Patient at Risk for Falls Due to: Medication side effect, Impaired balance/gait Fall risk Follow up: Falls prevention discussed  Psychosocial Psychosocial Symptoms Reported: No symptoms reported Additional Psychological Details: Patient with history of depression. Reports doing very well today. Reports significant improvements since being at the [facility] in [place], [place]. Plan is to relocate to [place] after housing in secured. Behavioral Management Strategies: Coping strategies, Counseling, Medication therapy, Complementary therapy(ies), Support system Behavioral Health Self-Management Outcome: 4 (good) Major Change/Loss/Stressor/Fears (CP): Medical condition, self Behaviors When Feeling Stressed/Fearful: Rest Techniques to Cope with Loss/Stress/Change:  Counseling, Spiritual practice(s), Medication Quality of Family Relationships: supportive Do you feel physically threatened by others?: No    There were no vitals filed for this visit.    Recommendation:   PCP Follow-up on October 21, 2023  Follow Up Plan:   Telephone follow up appointment with Nurse Case Manager on November 23, 2023   Jackson Acron [facility] RN Care Manager Direct Dial: [phone]  Fax: [phone] Website: [email or web address]

## 2023-10-15 NOTE — Patient Instructions (Addendum)
 Thank you for allowing the Complex Care Management team to participate in your care. It was great speaking with you!  I am so glad to hear that you are doing well and have made so many wonderful improvements! Stay Encouraged!  Reminders: -Please be sure to attend the appointment with your PCP as scheduled on October 21, 2023.  We will follow up on November 23, 2023 at 1230. Please do not hesitate to contact me if you require assistance prior to our next outreach.    Jackson Acron [facility] Population Health RN Care Manager Direct Dial: [phone]  Fax: [phone] Website: [email or web address]

## 2023-10-20 ENCOUNTER — Other Ambulatory Visit (HOSPITAL_COMMUNITY): Payer: Self-pay

## 2023-10-20 DIAGNOSIS — F319 Bipolar disorder, unspecified: Secondary | ICD-10-CM

## 2023-10-20 DIAGNOSIS — F609 Personality disorder, unspecified: Secondary | ICD-10-CM

## 2023-10-20 DIAGNOSIS — F411 Generalized anxiety disorder: Secondary | ICD-10-CM

## 2023-10-20 DIAGNOSIS — F4310 Post-traumatic stress disorder, unspecified: Secondary | ICD-10-CM

## 2023-10-20 MED ORDER — FLUOXETINE HCL 20 MG PO CAPS: 20.0000 mg | ORAL_CAPSULE | Freq: Every day | ORAL | 2 refills | Status: DC

## 2023-10-20 MED ORDER — QUETIAPINE FUMARATE 400 MG PO TABS: 400.0000 mg | ORAL_TABLET | Freq: Every day | ORAL | 2 refills | Status: DC

## 2023-10-20 MED ORDER — CLONAZEPAM 0.5 MG PO TABS: 0.5000 mg | ORAL_TABLET | Freq: Three times a day (TID) | ORAL | 2 refills | Status: DC

## 2023-10-21 ENCOUNTER — Ambulatory Visit: Admitting: Family Medicine

## 2023-10-21 ENCOUNTER — Ambulatory Visit: Payer: Self-pay

## 2023-10-21 NOTE — Telephone Encounter (Signed)
 FYI Only or Action Required?: Action required by provider: update on patient condition.  Patient was last seen in primary care on 07/29/2023 by Plotnikov, Karlynn GAILS, MD.  Called Nurse Triage reporting Altered Mental Status.  Symptoms began today.  Interventions attempted: Nothing.  Symptoms are: rapidly worsening.  Triage Disposition: Go to ED Now (or PCP Triage)  Patient/caregiver understands and will follow disposition?: UnsureCopied from CRM #[id number]. Topic: Clinical - Red Word Triage >> Oct 21, 2023  9:47 AM Drema MATSU wrote: Red Word that prompted transfer to Nurse Triage: Patient is having problems with blood pressure. Blood pressure was 147/98 this morning and yesterday it was 157/108. She stated that she has been light headed, dizzy, and under a lot of stress. >> Oct 21, 2023 10:13 AM [place] E wrote: Patient also has stated she is having memory issues that she thinks are declining over the past month. Reason for Disposition  Patient sounds very sick or weak to the triager  Answer Assessment - Initial Assessment Questions 1. LEVEL OF CONSCIOUSNESS: How are they (the patient) acting right now? (e.g., alert-oriented, confused, lethargic, stuporous, comatose)  Pt was already triaged for elevated BP and was told go to ED. Pt forgot she called in already. Pt stated,  I keep forgetting I do things and people tell I already do it. Pt is stating she is low on bp medication and needs more. Pt forgot about app this morning. RN advised pt to still go to ED as previous triage advised. Pt stated I dont want to go, but I'll try.  I just need my medicine. CAL notified.  Protocols used: Confusion - Delirium-A-AH
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
## 2023-10-21 NOTE — Telephone Encounter (Signed)
 Please see other encounter for documentation.

## 2023-10-21 NOTE — Telephone Encounter (Signed)
 FYI Only or Action Required?: Action required by provider: update on patient condition.  Patient was last seen in primary care on 07/29/2023 by Plotnikov, Karlynn GAILS, MD.  Called Nurse Triage reporting Hypertension.  Symptoms began yesterday.  Interventions attempted: Nothing.  Symptoms are: unchanged.  Triage Disposition: Go to ED Now (Notify PCP)  Patient/caregiver understands and will follow disposition?: No, refuses disposition       Copied from CRM [id number]. Topic: Clinical - Red Word Triage >> Oct 21, 2023  9:47 AM Drema MATSU wrote: Red Word that prompted transfer to Nurse Triage: Patient is having problems with blood pressure. Blood pressure was 147/98 this morning and yesterday it was 157/108. She stated that she has been light headed, dizzy, and under a lot of stress. Reason for Disposition  [1] Systolic BP >= 160 OR Diastolic >= 100 AND [2] cardiac (e.g., breathing difficulty, chest pain) or neurologic symptoms (e.g., new-onset blurred or double vision, unsteady gait)  Answer Assessment - Initial Assessment Questions 1. BLOOD PRESSURE: What is your blood pressure? Did you take at least two measurements 5 minutes apart?     147/98 this AM at 0800 2. ONSET: When did you take your blood pressure?     yesterday 3. HOW: How did you take your blood pressure? (e.g., automatic home BP monitor, visiting nurse)     auto 4. HISTORY: Do you have a history of high blood pressure?     yes 5. MEDICINES: Are you taking any medicines for blood pressure? Have you missed any doses recently?     Has not been taking rx - endorses being homeless 6. OTHER SYMPTOMS: Do you have any symptoms? (e.g., blurred vision, chest pain, difficulty breathing, headache, weakness)     Dizziness, underneath R arm gas 7. PREGNANCY: Is there any chance you are pregnant? When was your last menstrual period?     N/a    Pt reports currently homeless and has limited resources. Triager  advised to call 911, but pt declined. Pt would like [name], NP to send in HTN medication to new pharmacy closest to her shelter. Triager will forward encounter for [name], NP 's office to review and advise. Patient verbalized understanding and is expecting call back from office for next steps, if any.   Triager called [facility] CAL and spoke to [facility] about ED refusal and HP message.  AttnBETHA Lacks, CMA  Protocols used: Blood Pressure - High-A-AH

## 2023-10-28 ENCOUNTER — Ambulatory Visit: Admitting: Family Medicine

## 2023-10-28 ENCOUNTER — Encounter: Payer: Self-pay | Admitting: Family Medicine

## 2023-11-04 ENCOUNTER — Telehealth: Payer: Self-pay | Admitting: Radiology

## 2023-11-04 ENCOUNTER — Telehealth: Payer: Self-pay | Admitting: Family Medicine
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
## 2023-11-04 NOTE — Telephone Encounter (Signed)
 MRN: [id number]: Communication Reason for CRM: Patient called in to schedule an appointment with NP Boby Mackintosh but nothing is populating in the scheduling template for her.     Patient stated that she's had issues with transportation but she was able to speak with them and they provided her with dates for scheduling:   october 3rd @ 11am   october 10th @ 10am-2pm   october 14th-17th @ anytime in the morning     Patient would prefer to see her as soon as possible due to neuropathy. Medicare also told patient that they need a letter for hand operational mobility to install in car. They need a letter from clinic and they will pay for it.     Patient states MyChart is also okay for communication as well.   [phone] (H)

## 2023-11-04 NOTE — Telephone Encounter (Signed)
 Spoke with patient about dismissal and reason.  Patient is in [place], [place] with all resources and help with transportation to appointments. Spoke with provider, and willing to see patient on listed dates.  Scheduled patient on 10/3 @ 11am in person, so there is no concerns with Medicare accepting a virtual visit. She also has concerns around there weight loss and BP.   Patient is going to work to have all the information, Medicare will provide her, so documentation for her hand car controls (brake, accelerator) can be approved and setup with insurance. Discussed she will possibly be provided a form that will need to go with the provider documentation, what specifics must be addressed in the documentation, along with a caseworker, where we need to or she needs to send completed information. She agreed with plan, to work on this before her next appointment.

## 2023-11-04 NOTE — Telephone Encounter (Signed)
 Are you willing to see the patient on one of the below dates?

## 2023-11-18 ENCOUNTER — Other Ambulatory Visit

## 2023-11-18 ENCOUNTER — Telehealth (HOSPITAL_COMMUNITY): Admitting: Psychiatry

## 2023-11-18 ENCOUNTER — Encounter (HOSPITAL_COMMUNITY): Payer: Self-pay | Admitting: Psychiatry

## 2023-11-18 VITALS — Wt 201.0 lb

## 2023-11-18 DIAGNOSIS — F4310 Post-traumatic stress disorder, unspecified: Secondary | ICD-10-CM

## 2023-11-18 DIAGNOSIS — F411 Generalized anxiety disorder: Secondary | ICD-10-CM

## 2023-11-18 DIAGNOSIS — F319 Bipolar disorder, unspecified: Secondary | ICD-10-CM

## 2023-11-18 MED ORDER — BUSPIRONE HCL 5 MG PO TABS: 5.0000 mg | ORAL_TABLET | Freq: Two times a day (BID) | ORAL | 1 refills | Status: AC

## 2023-11-18 MED ORDER — QUETIAPINE FUMARATE 400 MG PO TABS: 400.0000 mg | ORAL_TABLET | Freq: Every day | ORAL | 1 refills | Status: DC

## 2023-11-18 NOTE — Patient Instructions (Addendum)
 Thank you for allowing the Complex Care Management team to participate in your care. It was great speaking with you.  Congratulations on your new apartment and doing such a great job managing your health! I'm so excited for you. Keep up the great work!  Please be sure to notify your PCP if your health status changes or declines. The care management team will gladly assist.    Jackson Acron [facility] RN Care Manager Direct Dial: [phone]  Fax: [phone] Website: [email or web address]   '
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
## 2023-11-18 NOTE — Patient Outreach (Signed)
 Complex Care Management   Visit Note  11/18/2023  Name:  Shannon [name] MRN: [id number] DOB: 08-Sep-1965  Situation: Referral received for Complex Care Management related to Hyperlipidemia and Peripheral Neuropathy. I obtained verbal consent from Patient.  Visit completed with Ms. Pottenger via telephone.  Background:   Past Medical History:  Diagnosis Date   Allergic rhinitis    Allergic rhinitis 01/25/2009        ANEMIA-IRON  DEFICIENCY 01/25/2009   Anxiety    Backache 01/25/2009   Qualifier: Diagnosis of  By: Norleen MD, [place] ORN    Bipolar 1 disorder [facility])    Chronic pain syndrome    Complete rotator cuff tear 07/21/2013   Complete tear of right rotator cuff 07/21/2013   Contact lens/glasses fitting    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   DISC DISEASE, LUMBAR 01/25/2009   Essential hypertension 01/25/2009   On Bystolic     GAD (generalized anxiety disorder) 12/28/2013   GERD (gastroesophageal reflux disease)    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   Headache, acute 12/20/2013   Hx of laparoscopic gastric banding 09/03/2010   Surgery date: 07/17/10    HYPERLIPIDEMIA 01/25/2009   Hyperlipidemia with target LDL less than 130 01/25/2009   HYPERTENSION 01/25/2009   Hypokalemia, inadequate intake 09/01/2011   Insomnia 04/04/2011   INSOMNIA-SLEEP DISORDER-UNSPEC 04/30/2009   Qualifier: Diagnosis of  By: Norleen MD, Lynwood ORN    Iron  deficiency anemia 01/25/2009        Labyrinthitis 02/19/2014   Leiomyoma of uterus, unspecified 08/13/2008   Overview:  Leiomyoma Of The Uterus  10/1 IMO update   Localized edema 06/12/2015   MANIC DEPRESSIVE ILLNESS 01/25/2009   pt is unsue if this is her specifc dx   Mixed bipolar I disorder (HCC) 07/12/2012   Morbid obesity (HCC) 01/25/2009   Night sweats    Osteopenia determined by x-ray 06/13/2021   Other abnormal glucose 10/26/2012   Palpitations 10/25/2013   PEPTIC ULCER DISEASE, HELICOBACTER PYLORI POSITIVE 10/03/2009   Peripheral edema 06/25/2015   Pernicious  anemia 03/28/2012   Piriformis syndrome of both sides 09/02/2018   PUD (peptic ulcer disease) 10/03/2009        Right shoulder pain 05/03/2013   Dg Shoulder Right  05/03/2013   CLINICAL DATA Pain.  EXAM RIGHT SHOULDER - 2+ VIEW  COMPARISON None.  FINDINGS Acromioclavicular and glenohumeral degenerative change present. Questionable calcific density noted in the region of the supraspinatus space, possibly representing calcific supraspinatus tendinitis. This could represent a sclerotic density in the acromion. MRI of the right shoulder suggest   Sleep apnea 08/12/2016   SMOKER 01/25/2009   Qualifier: Diagnosis of  By: Norleen MD, [place] ORN    Subacromial bursitis 05/17/2013   SUBSTANCE ABUSE 11/06/2009        Thiamin deficiency 10/30/2012   Tobacco abuse 06/09/2010   Visual disturbance 05/03/2013   Vitamin D  deficiency 10/27/2012    Assessment: Patient Reported Symptoms:  Cognitive Cognitive Status: Alert and oriented to person, place, and time, Normal speech and language skills Cognitive/Intellectual Conditions Management [RPT]: None reported or documented in medical history or problem list Health Maintenance Behaviors: Annual physical exam, Spiritual practice(s), Stress management Healing Pattern: Slow Health Facilitated by: Stress management, Pain control, Rest  Neurological Neurological Review of Symptoms: Other: [place] Neurological Symptoms/Conditions [RPT]: Reports Peripheral Neuropathy which is a chronic issue Neurological Management Strategies: Routine screening, Medication therapy, Medical device, Coping strategies, Adequate rest Neurological Self-Management Outcome: 4 (good)  HEENT HEENT Symptoms Reported: No symptoms reported  HEENT Management Strategies: Routine screening HEENT Self-Management Outcome: 4 (good)  Cardiovascular Cardiovascular Symptoms Reported: Swelling in legs or feet Does patient have uncontrolled Hypertension?: No Cardiovascular Management Strategies: Medication therapy,  Routine screening, Diet modification Cardiovascular Self-Management Outcome: 4 (good) Cardiovascular Comment: Experiences episodes of lower extremitity edema which has been chronic. Currently managed with medications  Respiratory Respiratory Symptoms Reported: No symptoms reported Respiratory Self-Management Outcome: 4 (good)  Endocrine Endocrine Symptoms Reported: No symptoms reported Is patient diabetic?: No Endocrine Self-Management Outcome: 4 (good)  Gastrointestinal Gastrointestinal Symptoms Reported: No symptoms reported  Genitourinary Genitourinary Symptoms Reported: No symptoms reported Genitourinary Self-Management Outcome: 4 (good)  Integumentary Integumentary Symptoms Reported: No symptoms reported  Musculoskeletal Musculoskelatal Symptoms Reviewed: Unsteady gait Additional Musculoskeletal Details: Uses a walker with ambulation  Psychosocial Psychosocial Symptoms Reported: No symptoms reported Quality of Family Relationships: supportive Do you feel physically threatened by others?: No     There were no vitals filed for this visit.  Medications Reviewed Today     Reviewed by Karoline Lima, RN (Registered Nurse) on 11/18/23 at 1042  Med List Status: <None>   Medication Order Taking? Sig Documenting Provider Last Dose Status Informant  AMBULATORY NON FORMULARY MEDICATION [id number]  Walking cane R20.0 R20.2 Leonce Katz, DO  Active   atorvastatin  (LIPITOR) 20 MG tablet [id number]  Take 1 tablet (20 mg total) by mouth daily. Plotnikov, Aleksei V, MD  Active   busPIRone  (BUSPAR ) 5 MG tablet [id number]  Take 1 tablet (5 mg total) by mouth 2 (two) times daily. Arfeen, Syed T, MD  Active   Cholecalciferol  (VITAMIN D3) 50 MCG (2000 UT) capsule [id number]  Take 1 capsule (2,000 Units total) by mouth daily. Lendia Boby CROME, NP-C  Active            Med Note HALLIE, Aliveah Gallant N   Fri May 14, 2023  4:02 PM) Needs Refill  clonazePAM  (KLONOPIN ) 0.5 MG tablet [id number]  Take 1 tablet  (0.5 mg total) by mouth in the morning, at noon, and at bedtime.  Patient not taking: Reported on 11/18/2023   Arfeen, Syed T, MD  Active   cyanocobalamin  1000 MCG tablet [id number]  Take 1 tablet (1,000 mcg total) by mouth daily. Regalado, Belkys A, MD  Active   FLUoxetine  (PROZAC ) 20 MG capsule [id number]  Take 1 capsule (20 mg total) by mouth daily.  Patient not taking: Reported on 11/18/2023   Arfeen, Syed T, MD  Active   gabapentin  (NEURONTIN ) 400 MG capsule [id number]  TAKE 2 CAPSULES (800 MG TOTAL) BY MOUTH 3 (THREE) TIMES DAILY. Leonce Katz, DO  Expired 08/27/23 2359   hydrochlorothiazide  (HYDRODIURIL ) 12.5 MG tablet [id number]  Take 1 tablet (12.5 mg total) by mouth daily. Plotnikov, Aleksei V, MD  Active   Multiple Vitamins-Minerals (BARIATRIC MULTIVITAMINS/IRON ) CAPS [id number]  Take 1 capsule by mouth daily. Lendia Boby CROME, NP-C  Active Self, Pharmacy Records           Med Note CARLEEN, [place] D   Thu Jan 28, 2023  7:31 AM)    pantoprazole  (PROTONIX ) 40 MG tablet [id number]  Take 1 tablet (40 mg total) by mouth 2 (two) times daily before a meal. Henson, [name], NP-C  Active   potassium chloride  SA (KLOR-CON  M) 20 MEQ tablet [id number]  TAKE 1 TABLET (20 MEQ TOTAL) BY MOUTH DAILY. Henson, [name], NP-C  Expired 08/26/23 2359   QUEtiapine  (SEROQUEL ) 25 MG tablet [id number]  Take 1 tablet (25 mg total) by mouth 2 (two) times daily.  Patient  not taking: Reported on 08/26/2023   Arfeen, Syed T, MD  Expired 05/17/23 2359   QUEtiapine  (SEROQUEL ) 400 MG tablet [id number]  Take 1 tablet (400 mg total) by mouth at bedtime. Arfeen, Syed T, MD  Active   thiamine  (VITAMIN B1) 100 MG tablet [id number]  Take 1 tablet (100 mg total) by mouth daily. Regalado, Belkys A, MD  Active   topiramate  (TOPAMAX ) 50 MG tablet [id number]  Take 50 mg by mouth 2 (two) times daily. [provider]  Active            Med Note HALLIE, Kareem Cathey N   Fri May 14, 2023  4:04 PM) Needs Refills  traMADol  (ULTRAM )  50 MG tablet [id number]  Take 1 tablet (50 mg total) by mouth every 6 (six) hours as needed for severe pain (pain score 7-10). Plotnikov, Aleksei V, MD  Active             Recommendation:   PCP Follow-up November 26, 2023 Continue Current Plan of Care  Follow Up Plan:   Patient has met all care management goals.  Patient has been provided contact information should new needs arise.     [facility] Population Health RN Care Manager Direct Dial: [phone]  Fax: [phone] Website: [email or web address]

## 2023-11-23 ENCOUNTER — Telehealth

## 2023-11-26 ENCOUNTER — Ambulatory Visit (INDEPENDENT_AMBULATORY_CARE_PROVIDER_SITE_OTHER): Admitting: Family Medicine

## 2023-11-26 ENCOUNTER — Encounter: Payer: Self-pay | Admitting: Family Medicine

## 2023-11-26 VITALS — BP 150/70 | HR 90 | Temp 97.8°F | Ht 64.0 in | Wt 224.0 lb

## 2023-11-26 DIAGNOSIS — R2689 Other abnormalities of gait and mobility: Secondary | ICD-10-CM | POA: Diagnosis not present

## 2023-11-26 DIAGNOSIS — E785 Hyperlipidemia, unspecified: Secondary | ICD-10-CM | POA: Diagnosis not present

## 2023-11-26 DIAGNOSIS — R296 Repeated falls: Secondary | ICD-10-CM | POA: Diagnosis not present

## 2023-11-26 DIAGNOSIS — I10 Essential (primary) hypertension: Secondary | ICD-10-CM

## 2023-11-26 DIAGNOSIS — E669 Obesity, unspecified: Secondary | ICD-10-CM | POA: Diagnosis not present

## 2023-11-26 DIAGNOSIS — E519 Thiamine deficiency, unspecified: Secondary | ICD-10-CM

## 2023-11-26 DIAGNOSIS — H539 Unspecified visual disturbance: Secondary | ICD-10-CM

## 2023-11-26 DIAGNOSIS — R002 Palpitations: Secondary | ICD-10-CM | POA: Diagnosis not present

## 2023-11-26 DIAGNOSIS — Z748 Other problems related to care provider dependency: Secondary | ICD-10-CM

## 2023-11-26 DIAGNOSIS — E559 Vitamin D deficiency, unspecified: Secondary | ICD-10-CM

## 2023-11-26 DIAGNOSIS — E538 Deficiency of other specified B group vitamins: Secondary | ICD-10-CM | POA: Diagnosis not present

## 2023-11-26 DIAGNOSIS — G629 Polyneuropathy, unspecified: Secondary | ICD-10-CM | POA: Diagnosis not present

## 2023-11-26 DIAGNOSIS — F4310 Post-traumatic stress disorder, unspecified: Secondary | ICD-10-CM

## 2023-11-26 DIAGNOSIS — R29898 Other symptoms and signs involving the musculoskeletal system: Secondary | ICD-10-CM

## 2023-11-26 DIAGNOSIS — F319 Bipolar disorder, unspecified: Secondary | ICD-10-CM

## 2023-11-26 LAB — CBC WITH DIFFERENTIAL/PLATELET
Basophils Absolute: 0 K/uL (ref 0.0–0.1)
Basophils Relative: 0.6 % (ref 0.0–3.0)
Eosinophils Absolute: 0.1 K/uL (ref 0.0–0.7)
Eosinophils Relative: 1.4 % (ref 0.0–5.0)
HCT: 33 % — ABNORMAL LOW (ref 36.0–46.0)
Hemoglobin: 10.7 g/dL — ABNORMAL LOW (ref 12.0–15.0)
Lymphocytes Relative: 35.2 % (ref 12.0–46.0)
Lymphs Abs: 2.5 K/uL (ref 0.7–4.0)
MCHC: 32.6 g/dL (ref 30.0–36.0)
MCV: 80.3 fl (ref 78.0–100.0)
Monocytes Absolute: 0.3 K/uL (ref 0.1–1.0)
Monocytes Relative: 4.4 % (ref 3.0–12.0)
Neutro Abs: 4.1 K/uL (ref 1.4–7.7)
Neutrophils Relative %: 58.4 % (ref 43.0–77.0)
Platelets: 351 K/uL (ref 150.0–400.0)
RBC: 4.1 Mil/uL (ref 3.87–5.11)
RDW: 17.5 % — ABNORMAL HIGH (ref 11.5–15.5)
WBC: 7.1 K/uL (ref 4.0–10.5)

## 2023-11-26 LAB — COMPREHENSIVE METABOLIC PANEL WITH GFR
ALT: 18 U/L (ref 0–35)
AST: 15 U/L (ref 0–37)
Albumin: 3.6 g/dL (ref 3.5–5.2)
Alkaline Phosphatase: 94 U/L (ref 39–117)
BUN: 8 mg/dL (ref 6–23)
CO2: 31 meq/L (ref 19–32)
Calcium: 8.9 mg/dL (ref 8.4–10.5)
Chloride: 102 meq/L (ref 96–112)
Creatinine, Ser: 0.8 mg/dL (ref 0.40–1.20)
GFR: 80.65 mL/min (ref >60.00)
Glucose, Bld: 94 mg/dL (ref 70–99)
Potassium: 4.4 meq/L (ref 3.5–5.1)
Sodium: 139 meq/L (ref 135–145)
Total Bilirubin: 0.3 mg/dL (ref 0.2–1.2)
Total Protein: 7.4 g/dL (ref 6.0–8.3)

## 2023-11-26 LAB — LIPID PANEL
Cholesterol: 272 mg/dL — ABNORMAL HIGH (ref 0–200)
HDL: 67.2 mg/dL (ref >39.00)
LDL Cholesterol: 175 mg/dL — ABNORMAL HIGH (ref 0–99)
NonHDL: 205.15
Total CHOL/HDL Ratio: 4
Triglycerides: 150 mg/dL — ABNORMAL HIGH (ref 0.0–149.0)
VLDL: 30 mg/dL (ref 0.0–40.0)

## 2023-11-26 LAB — VITAMIN D 25 HYDROXY (VIT D DEFICIENCY, FRACTURES): VITD: 27.29 ng/mL — ABNORMAL LOW (ref 30.00–100.00)

## 2023-11-26 LAB — MAGNESIUM: Magnesium: 1.9 mg/dL (ref 1.5–2.5)

## 2023-11-26 LAB — HEMOGLOBIN A1C: Hgb A1c MFr Bld: 5.9 % (ref 4.6–6.5)

## 2023-11-26 LAB — FOLATE: Folate: 12.1 ng/mL (ref >5.9)

## 2023-11-26 LAB — TSH: TSH: 2.09 u[IU]/mL (ref 0.35–5.50)

## 2023-11-26 LAB — VITAMIN B12: Vitamin B-12: 214 pg/mL (ref 211–911)

## 2023-11-26 MED ORDER — VALSARTAN-HYDROCHLOROTHIAZIDE 160-12.5 MG PO TABS: 1.0000 | ORAL_TABLET | Freq: Every day | ORAL | 1 refills | Status: DC

## 2023-11-26 NOTE — Progress Notes (Unsigned)
 Subjective:     Patient ID: Shannon [name], female    DOB: March 01, 1965, 58 y.o.   MRN: [id number]  Chief Complaint  Patient presents with   Peripheral Neuropathy    Would like to get hand controls for car brake & accelerator. Doesn't have information, they just told her doctor has to submit it and they will pay for it.    Hypertension    BP has been ranging high. Ranging 250/100s Getting floaters in her eyes.    eye glasses    Needs prescription for eye glasses     HPI  Discussed the use of AI scribe software for clinical note transcription with the patient, who gave verbal consent to proceed.  History of Present Illness Shannon [name] is a 58 year old female with neuropathy who presents with leg weakness and requests hand car controls.  Peripheral neuropathy and gait disturbance - Worsening neuropathy, predominantly affecting the right leg and extending to the hip - Sensation of 'walking on a big pad' in the feet - Requires rollator for ambulation, especially when right leg is affected but does not have it today  - Falls approximately twice per month due to balance issues, attributed to weight and neuropathy - Able to get up independently after falls, though with difficulty and delay - Gabapentin  provides partial pain relief; has some medication available at home  Hypertension and cardiovascular symptoms - Elevated blood pressure in recent measurements - Currently taking low dose hydrochlorothiazide  - Experiences palpitations lasting approximately five minutes - Concerned about blood pressure control and potential complications  Visual disturbance - Blurred vision - No consistent ophthalmologic care to date - Interested in referral to ophthalmology for further evaluation  Hyperlipidemia - History of elevated cholesterol levels  Vitamin d  deficiency - Known vitamin D  deficiency - Not currently taking vitamin D  supplementation - Limited sun exposure due to religious  dress code - Interested in checking vitamin D  levels      Health Maintenance Due  Topic Date Due   Hepatitis B Vaccines 19-59 Average Risk (1 of 3 - 19+ 3-dose series) Never done   Pneumococcal Vaccine: 50+ Years (2 of 2 - PCV) 10/26/2013   Mammogram  04/22/2014   DTaP/Tdap/Td (2 - Tdap) 02/28/2019   Fecal DNA (Cologuard)  01/10/2020   Medicare Annual Wellness (AWV)  10/08/2022    Past Medical History:  Diagnosis Date   Allergic rhinitis    Allergic rhinitis 01/25/2009        ANEMIA-IRON  DEFICIENCY 01/25/2009   Anxiety    Backache 01/25/2009   Qualifier: Diagnosis of  By: Norleen MD, Lynwood ORN    Bipolar 1 disorder [facility])    Chronic pain syndrome    Complete rotator cuff tear 07/21/2013   Complete tear of right rotator cuff 07/21/2013   Contact lens/glasses fitting    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   DISC DISEASE, LUMBAR 01/25/2009   Essential hypertension 01/25/2009   On Bystolic     GAD (generalized anxiety disorder) 12/28/2013   GERD (gastroesophageal reflux disease)    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   Headache, acute 12/20/2013   Hx of laparoscopic gastric banding 09/03/2010   Surgery date: 07/17/10    HYPERLIPIDEMIA 01/25/2009   Hyperlipidemia with target LDL less than 130 01/25/2009   HYPERTENSION 01/25/2009   Hypokalemia, inadequate intake 09/01/2011   Insomnia 04/04/2011   INSOMNIA-SLEEP DISORDER-UNSPEC 04/30/2009   Qualifier: Diagnosis of  By: Norleen MD, Lynwood ORN    Iron  deficiency anemia  01/25/2009        Labyrinthitis 02/19/2014   Leiomyoma of uterus, unspecified 08/13/2008   Overview:  Leiomyoma Of The Uterus  10/1 IMO update   Localized edema 06/12/2015   MANIC DEPRESSIVE ILLNESS 01/25/2009   pt is unsue if this is her specifc dx   Mixed bipolar I disorder (HCC) 07/12/2012   Morbid obesity (HCC) 01/25/2009   Night sweats    Osteopenia determined by x-ray 06/13/2021   Other abnormal glucose 10/26/2012   Palpitations 10/25/2013   PEPTIC ULCER DISEASE, HELICOBACTER  PYLORI POSITIVE 10/03/2009   Peripheral edema 06/25/2015   Pernicious anemia 03/28/2012   Piriformis syndrome of both sides 09/02/2018   PUD (peptic ulcer disease) 10/03/2009        Right shoulder pain 05/03/2013   Dg Shoulder Right  05/03/2013   CLINICAL DATA Pain.  EXAM RIGHT SHOULDER - 2+ VIEW  COMPARISON None.  FINDINGS Acromioclavicular and glenohumeral degenerative change present. Questionable calcific density noted in the region of the supraspinatus space, possibly representing calcific supraspinatus tendinitis. This could represent a sclerotic density in the acromion. MRI of the right shoulder suggest   Sleep apnea 08/12/2016   SMOKER 01/25/2009   Qualifier: Diagnosis of  By: Norleen MD, Lynwood ORN    Subacromial bursitis 05/17/2013   SUBSTANCE ABUSE 11/06/2009        Thiamin deficiency 10/30/2012   Tobacco abuse 06/09/2010   Visual disturbance 05/03/2013   Vitamin D  deficiency 10/27/2012    Past Surgical History:  Procedure Laterality Date   ABDOMINAL HYSTERECTOMY     BLADDER SURGERY     s/p with ?diverticulitis   HAND TENDON SURGERY  1991   s/p-Right-index and middle   LAPAROSCOPIC GASTRIC BAND REMOVAL WITH LAPAROSCOPIC GASTRIC SLEEVE RESECTION N/A 08/17/2016   Procedure: LAPAROSCOPIC GASTRIC BAND REMOVAL WITH LAPAROSCOPIC GASTRIC SLEEVE RESECTION WITH UPPER ENDO;  Surgeon: Ethyl Lenis, MD;  Location: [facility];  Service: General;  Laterality: N/A;   LAPAROSCOPIC GASTRIC BANDING  07/14/10   SHOULDER ARTHROSCOPY Right 07/21/2013   Procedure: RIGHT ARTHROSCOPY SHOULDER DEBRIDMENT EXTENTSIVE,ARTHROSCOPIC REMOVE LOOSE FOREIGN BODY, BICEPS TENOLYSIS ;  Surgeon: Fonda SHAUNNA Olmsted, MD;  Location: [facility];  Service: Orthopedics;  Laterality: Right;   TUBAL LIGATION      Family History  Problem Relation Age of Onset   Hypertension Mother    Stroke Father    Cirrhosis Father        ETOH   Hypertension Father    Diabetes Father    Alcohol abuse Father    Hypertension Sister    Asthma  Sister    Hypertension Brother    Hypertension Paternal Aunt    Diabetes Maternal Grandmother    ADD / ADHD Son    ADD / ADHD Other        3 nephews, also emotional issues   Diabetes Other        Uncle   Diabetes Other        Aunt   Suicidality Neg Hx     Social History   Socioeconomic History   Marital status: Divorced    Spouse name: Not on file   Number of children: 2   Years of education: Not on file   Highest education level: Professional school degree (e.g., MD, DDS, DVM, JD)  Occupational History   Occupation: STUDENT    Employer: UNEMPLOYED  Tobacco Use   Smoking status: Every Day    Current packs/day: 0.25    Average packs/day: 0.3 packs/day  for 20.0 years (5.0 ttl pk-yrs)    Types: Cigarettes   Smokeless tobacco: Never   Tobacco comments:    States has cut back to 5-7 a day and working on this.   Vaping Use   Vaping status: Never Used  Substance and Sexual Activity   Alcohol use: No    Alcohol/week: 0.0 standard drinks of alcohol   Drug use: No   Sexual activity: Not Currently  Other Topics Concern   Not on file  Social History Narrative   Moved from [place] , then [place] to [place] 2009   2 children-1 boy, 1 girl   Disabled-bipolar   Daily Caffeine Use-1 cup/day      Right Handed   Lives in 2nd floor condo   Social Drivers of Health   Financial Resource Strain: Medium Risk (10/13/2023)   Overall Financial Resource Strain (CARDIA)    Difficulty of Paying Living Expenses: Somewhat hard  Food Insecurity: Food Insecurity Present (11/18/2023)   Hunger Vital Sign    Worried About Running Out of Food in the Last Year: Sometimes true    Ran Out of Food in the Last Year: Sometimes true  Transportation Needs: Unmet Transportation Needs (11/18/2023)   PRAPARE - Transportation    Lack of Transportation (Medical): Yes    Lack of Transportation (Non-Medical): Yes  Physical Activity: Inactive (03/12/2023)   Exercise Vital Sign    Days of Exercise per Week:  0 days    Minutes of Exercise per Session: 0 min  Stress: No Stress Concern Present (10/13/2023)   [employer] of Occupational Health - Occupational Stress Questionnaire    Feeling of Stress: Only a little  Social Connections: Moderately Integrated (11/18/2023)   Social Connection and Isolation Panel    Frequency of Communication with Friends and Family: More than three times a week    Frequency of Social Gatherings with Friends and Family: Not on file    Attends Religious Services: More than 4 times per year    Active Member of [employer] or Organizations: Yes    Attends Banker Meetings: Not on file    Marital Status: Separated  Recent Concern: Social Connections - Moderately Isolated (10/13/2023)   Social Connection and Isolation Panel    Frequency of Communication with Friends and Family: More than three times a week    Frequency of Social Gatherings with Friends and Family: Once a week    Attends Religious Services: More than 4 times per year    Active Member of [employer] or Organizations: No    Attends Banker Meetings: Never    Marital Status: Separated  Intimate Partner Violence: Not At Risk (06/23/2023)   Humiliation, Afraid, Rape, and Kick questionnaire    Fear of Current or Ex-Partner: No    Emotionally Abused: No    Physically Abused: No    Sexually Abused: No    Outpatient Medications Prior to Visit  Medication Sig Dispense Refill   AMBULATORY NON FORMULARY MEDICATION Walking cane R20.0 R20.2 1 Units 0   busPIRone  (BUSPAR ) 5 MG tablet Take 1 tablet (5 mg total) by mouth 2 (two) times daily. 60 tablet 1   Cholecalciferol  (VITAMIN D3) 50 MCG (2000 UT) capsule Take 1 capsule (2,000 Units total) by mouth daily. 90 capsule 1   clonazePAM  (KLONOPIN ) 0.5 MG tablet Take 1 tablet (0.5 mg total) by mouth in the morning, at noon, and at bedtime. 90 tablet 2   cyanocobalamin  1000 MCG tablet Take 1 tablet (1,000  mcg total) by mouth daily. 30 tablet 0    FLUoxetine  (PROZAC ) 20 MG capsule Take 1 capsule (20 mg total) by mouth daily. (Patient taking differently: Take 25 mg by mouth daily.) 30 capsule 2   gabapentin  (NEURONTIN ) 400 MG capsule TAKE 2 CAPSULES (800 MG TOTAL) BY MOUTH 3 (THREE) TIMES DAILY. 180 capsule 1   Multiple Vitamins-Minerals (BARIATRIC MULTIVITAMINS/IRON ) CAPS Take 1 capsule by mouth daily. 30 capsule 2   pantoprazole  (PROTONIX ) 40 MG tablet Take 1 tablet (40 mg total) by mouth 2 (two) times daily before a meal. 180 tablet 1   potassium chloride  SA (KLOR-CON  M) 20 MEQ tablet TAKE 1 TABLET (20 MEQ TOTAL) BY MOUTH DAILY. 90 tablet 0   QUEtiapine  (SEROQUEL ) 25 MG tablet Take 1 tablet (25 mg total) by mouth 2 (two) times daily. 60 tablet 1   QUEtiapine  (SEROQUEL ) 400 MG tablet Take 1 tablet (400 mg total) by mouth at bedtime. 30 tablet 1   thiamine  (VITAMIN B1) 100 MG tablet Take 1 tablet (100 mg total) by mouth daily. 30 tablet 0   topiramate  (TOPAMAX ) 50 MG tablet Take 50 mg by mouth 2 (two) times daily.     traMADol  (ULTRAM ) 50 MG tablet Take 1 tablet (50 mg total) by mouth every 6 (six) hours as needed for severe pain (pain score 7-10). 20 tablet 1   atorvastatin  (LIPITOR) 20 MG tablet Take 1 tablet (20 mg total) by mouth daily. 90 tablet 1   hydrochlorothiazide  (HYDRODIURIL ) 12.5 MG tablet Take 1 tablet (12.5 mg total) by mouth daily. 90 tablet 1   No facility-administered medications prior to visit.    Allergies  Allergen Reactions   Porcine (Pork) Protein-Containing Drug Products Other (See Comments) and Anaphylaxis    Religious Reasons (Muslim)   Lamictal  [Lamotrigine ] Rash    Review of Systems  Constitutional:  Positive for malaise/fatigue and weight loss. Negative for chills and fever.       Intentional weight loss   Eyes:  Positive for blurred vision. Negative for double vision and pain.  Respiratory:  Negative for shortness of breath.   Cardiovascular:  Positive for palpitations. Negative for chest pain and leg  swelling.  Gastrointestinal:  Negative for abdominal pain, constipation, diarrhea, nausea and vomiting.  Genitourinary:  Negative for dysuria, frequency and urgency.  Musculoskeletal:  Positive for falls and joint pain.  Neurological:  Negative for dizziness, focal weakness and headaches.  Psychiatric/Behavioral:  Positive for depression. Negative for substance abuse and suicidal ideas. The patient is nervous/anxious.        Objective:    Physical Exam Constitutional:      General: She is not in acute distress.    Appearance: She is not ill-appearing.  HENT:     Mouth/Throat:     Mouth: Mucous membranes are moist.     Pharynx: Oropharynx is clear.  Eyes:     Extraocular Movements: Extraocular movements intact.     Conjunctiva/sclera: Conjunctivae normal.  Cardiovascular:     Rate and Rhythm: Normal rate and regular rhythm.  Pulmonary:     Effort: Pulmonary effort is normal.     Breath sounds: Normal breath sounds.  Musculoskeletal:     Cervical back: Normal range of motion and neck supple.     Right lower leg: No edema.     Left lower leg: No edema.  Skin:    General: Skin is warm and dry.  Neurological:     General: No focal deficit present.  Mental Status: She is alert and oriented to person, place, and time.     Motor: No weakness.     Coordination: Coordination normal.     Gait: Gait normal.  Psychiatric:        Mood and Affect: Mood normal.        Behavior: Behavior normal.        Thought Content: Thought content normal.      BP (!) 150/70 (BP Location: Left Arm, Patient Position: Sitting)   Pulse 90   Temp 97.8 F (36.6 C) (Temporal)   Ht 5' 4 (1.626 m)   Wt 224 lb (101.6 kg)   SpO2 95%   BMI 38.45 kg/m  Wt Readings from Last 3 Encounters:  11/26/23 224 lb (101.6 kg)  03/11/23 201 lb (91.2 kg)  03/10/23 201 lb (91.2 kg)       Assessment & Plan:   Problem List Items Addressed This Visit     Essential hypertension - Primary (Chronic)    Relevant Orders   CBC with Differential/Platelet (Completed)   Comprehensive metabolic panel with GFR (Completed)   TSH (Completed)   EKG 12-Lead (Completed)   Folate deficiency   Relevant Orders   Folate (Completed)   Hyperlipidemia with target LDL less than 130   Relevant Orders   Lipid panel (Completed)   Palpitations   Relevant Orders   CBC with Differential/Platelet (Completed)   Comprehensive metabolic panel with GFR (Completed)   Folate (Completed)   TSH (Completed)   Magnesium  (Completed)   EKG 12-Lead (Completed)   Thiamin deficiency   Relevant Orders   Vitamin B1 (Completed)   Vitamin B12 deficiency   Relevant Orders   Vitamin B12 (Completed)   Other Visit Diagnoses       Vision changes       Relevant Orders   Ambulatory referral to Ophthalmology     Bipolar I disorder (HCC)         PTSD (post-traumatic stress disorder)         Assistance needed with transportation         Bilateral leg weakness       Relevant Orders   Ambulatory referral to Neurology     Neuropathy       Relevant Orders   Ambulatory referral to Neurology     Frequent falls       Relevant Orders   Ambulatory referral to Neurology     Balance disorder       Relevant Orders   Ambulatory referral to Neurology     Vitamin D  deficiency       Relevant Orders   VITAMIN D  25 Hydroxy (Vit-D Deficiency, Fractures) (Completed)     Obesity (BMI 30-39.9)       Relevant Orders   Hemoglobin A1c (Completed)       Assessment and Plan Assessment & Plan Peripheral neuropathy with leg weakness and recurrent falls Chronic peripheral neuropathy with worsening symptoms, including leg weakness and recurrent falls. Reports falling approximately twice a month and difficulty getting up due to weight. Neuropathy symptoms include numbness and a sensation of walking on a pad. Previous brain MRI in May 2025, per Dr. Leonce, showed no acute abnormalities.  - Refer to neurology for further evaluation and nerve  conduction testing - Continue gabapentin  for neuropathy pain management - Advise contacting Dr. Leonce for gabapentin  refills as needed  Hypertension Blood pressure is elevated. Currently on a low dose of hydrochlorothiazide , which is insufficient for blood pressure  control. Reports palpitations lasting about five minutes, which are concerning for cardiovascular issues. - Order EKG to evaluate palpitations - Order blood work to assess kidney function - Stop hydrochlorothiazide  and start valsartan-hydrochlorothiazide   - Low sodium diet.  - Monitor BP at home and follow up in 6 weeks   Obesity Obesity is contributing to difficulty with mobility and falls. She expresses a desire to lose weight and improve health as she approaches 58 years of age. - Encourage healthy eating and weight loss efforts  Hyperlipidemia Hyperlipidemia is present, contributing to cardiovascular risk.  Vitamin D  deficiency Vitamin D  deficiency likely due to limited sun exposure related to cultural dress practices. Not currently taking vitamin D  supplements. - Order blood work to check vitamin D  levels - Prescribe vitamin D  supplementation based on lab results  Blurred vision Blurred vision persists. No regular eye doctor. - Refer to ophthalmology for comprehensive eye examination  Depression Depression is well-managed with current medication regimen. Reports improved mental health and stability after moving and receiving care from Dr. Dottie.  Palpitations Reports palpitations lasting about five minutes, causing anxiety.  - Order EKG to evaluate palpitations - check labs including thyroid  function  - EKG shows NSR, rate 81, no acute ST- T wave changes      I have discontinued Alvina N. Card's hydrochlorothiazide . I am also having her maintain her Bariatric Multivitamins/Iron , cyanocobalamin , thiamine , topiramate , AMBULATORY NON FORMULARY MEDICATION, QUEtiapine , Vitamin D3, gabapentin , potassium  chloride SA, traMADol , pantoprazole , FLUoxetine , clonazePAM , QUEtiapine , and busPIRone .  Meds ordered this encounter  Medications   DISCONTD: valsartan-hydrochlorothiazide  (DIOVAN-HCT) 160-12.5 MG tablet    Sig: Take 1 tablet by mouth daily.    Dispense:  90 tablet    Refill:  1    Supervising Provider:   ROLLENE NORRIS A [[id number]]

## 2023-11-26 NOTE — Patient Instructions (Signed)
 Please be aware that I am changing your blood pressure medication.  Stop hydrochlorothiazide  and start valsartan-hydrochlorothiazide .   Continue monitoring your blood pressure at home  I would like to see you back in 6 weeks  I referred you to neurology and ophthalmology and both offices should call you to schedule

## 2023-11-29 ENCOUNTER — Ambulatory Visit: Payer: Self-pay | Admitting: Family Medicine

## 2023-11-29 DIAGNOSIS — E785 Hyperlipidemia, unspecified: Secondary | ICD-10-CM

## 2023-11-29 LAB — EXTRA SPECIMEN

## 2023-11-29 LAB — VITAMIN B1

## 2023-11-29 MED ORDER — VALSARTAN-HYDROCHLOROTHIAZIDE 160-12.5 MG PO TABS: 1.0000 | ORAL_TABLET | Freq: Every day | ORAL | 1 refills | Status: DC

## 2023-11-29 MED ORDER — ATORVASTATIN CALCIUM 20 MG PO TABS
20.0000 mg | ORAL_TABLET | Freq: Every day | ORAL | 1 refills | Status: AC
Start: 2023-11-29 — End: ?

## 2023-11-29 NOTE — Progress Notes (Signed)
 Please reach out to her and see if she needs refills on atorvastatin  and if she has been taking it consistently.  She needs to restart the medication.  I would also like for her to increase her over-the-counter vitamin D3 dose to 2000 IUs daily and start taking a vitamin B12 supplement 1000 mcg daily.  We will recheck her cholesterol so ask her to come in fasting to her follow-up appointment in 6 weeks

## 2023-12-03 ENCOUNTER — Ambulatory Visit: Payer: Self-pay

## 2023-12-03 NOTE — Telephone Encounter (Signed)
 FYI Only or Action Required?: Action required by provider: request for documentation or forms.  Patient was last seen in primary care on 11/26/2023 by Lendia Boby CROME, NP-C.  Called Nurse Triage reporting Anxiety.  Symptoms began yesterday.  Interventions attempted: Prescription medications: Prozac , Seroquel  .  Symptoms are: unchanged.  Triage Disposition: See Physician Within 24 Hours  Patient/caregiver understands and will follow disposition?: Yes Reason for Disposition  Patient sounds very upset or troubled to the triager  Answer Assessment - Initial Assessment Questions Patient's states was homeless in [place], went to a woman's shelter in [place]  and is now living in section 8. Patient states she is all alone, and wants to come back home to [facility]. Section 8 caseworkers state she needs a port out letter written from doctor stating she needs to return to [place] due to her health concerns and needs to be where her doctors are. This way there is no penalty for breaking lease and the housing assistance can be used in [place]. Ms. Emilio is the section 8 case worker: [phone]. Advised patient to also call Psychiatrist office, provided with number. Also given 24-hour immediate assistance line for mental health. Patient call back number: [phone]  1. CONCERN: Did anything happen that prompted you to call today?      Feeling paranoid and scared due to people in section 8 housing community who are homeless hanging around on porch. Has no sort of support or doctors in [place]  and wants to come back to [place].  2. ANXIETY SYMPTOMS: Can you describe how you (your loved one; patient) have been feeling? (e.g., tense, restless, panicky, anxious, keyed up, overwhelmed, sense of impending doom).      Nervousness, paranoia, racing thoughts  3. ONSET: How long have you been feeling this way? (e.g., hours, days, weeks)     Yesterday  4. SEVERITY: How would you rate the level of anxiety? (e.g., 0 - 10; or  mild, moderate, severe).     Severe  5. RISK OF HARM - SUICIDAL IDEATION: Do you ever have thoughts of hurting or killing yourself? If Yes, ask:  Do you have these feelings now? Do you have a plan on how you would do this?     Refuses to answer -she does not want to share all of her thoughts because when she does, the police get sent to her house and it triggers her, makes her feel embarrassed and worse, like she wants to run away and does not find that helpful. States she really is trying to not go to the hospital.  6. TREATMENT:  What has been done so far to treat this anxiety? (e.g., medicines, relaxation strategies). What has helped?     Prozac , Seroquel .   7. THERAPIST: Do you have a counselor or therapist? If Yes, ask: What is their name?     Psychiatrist, Dr. Leni, had a virtual visit with him 9/25.  8. PATIENT SUPPORT: Who is with you now? Who do you live with? Do you have family or friends who you can talk to?        Lives alone, does not have any friends or family around, states has friends and Islamic community in [place]. Was texting someone from [place] community today.   9. OTHER SYMPTOMS: Do you have any other symptoms? (e.g., feeling depressed, trouble concentrating, trouble sleeping, trouble breathing, palpitations or fast heartbeat, chest pain, sweating, nausea, or diarrhea)       Has neuropathy, legs feel stiff.  Protocols used: Anxiety  and Panic Attack-A-AH  Copied from CRM #[id number]. Topic: Clinical - Red Word Triage >> Dec 03, 2023  2:46 PM Alfonso ORN wrote: Red Word that prompted transfer to Nurse Triage: nervousness, paranoia, racing thoughts, uncomfortable and on edge and afraid. Feels as tho she doesn't have enough support in new location. Section 8 has to speak to doctor or she will be cut off

## 2023-12-07 NOTE — Telephone Encounter (Signed)
 Letter has been typed, ATC pt to inform as we have no way of getting it to her. Will need to know where to fax

## 2023-12-16 DIAGNOSIS — R410 Disorientation, unspecified: Secondary | ICD-10-CM | POA: Diagnosis not present

## 2023-12-17 DIAGNOSIS — S82832A Other fracture of upper and lower end of left fibula, initial encounter for closed fracture: Secondary | ICD-10-CM | POA: Diagnosis not present

## 2023-12-17 DIAGNOSIS — S83409S Sprain of unspecified collateral ligament of unspecified knee, sequela: Secondary | ICD-10-CM | POA: Diagnosis not present

## 2023-12-17 DIAGNOSIS — M84364A Stress fracture, left fibula, initial encounter for fracture: Secondary | ICD-10-CM | POA: Diagnosis not present

## 2023-12-19 DIAGNOSIS — M25511 Pain in right shoulder: Secondary | ICD-10-CM | POA: Diagnosis not present

## 2023-12-19 DIAGNOSIS — M25562 Pain in left knee: Secondary | ICD-10-CM | POA: Diagnosis not present

## 2023-12-19 DIAGNOSIS — S8002XA Contusion of left knee, initial encounter: Secondary | ICD-10-CM | POA: Diagnosis not present

## 2023-12-19 DIAGNOSIS — S43492A Other sprain of left shoulder joint, initial encounter: Secondary | ICD-10-CM | POA: Diagnosis not present

## 2023-12-19 DIAGNOSIS — M25551 Pain in right hip: Secondary | ICD-10-CM | POA: Diagnosis not present

## 2023-12-19 DIAGNOSIS — S7001XA Contusion of right hip, initial encounter: Secondary | ICD-10-CM | POA: Diagnosis not present

## 2023-12-19 DIAGNOSIS — Z720 Tobacco use: Secondary | ICD-10-CM | POA: Diagnosis not present

## 2023-12-23 ENCOUNTER — Ambulatory Visit (HOSPITAL_COMMUNITY): Admitting: Psychiatry

## 2023-12-23 ENCOUNTER — Encounter (HOSPITAL_COMMUNITY): Payer: Self-pay

## 2023-12-30 ENCOUNTER — Ambulatory Visit (HOSPITAL_COMMUNITY): Admitting: Psychiatry

## 2024-01-06 ENCOUNTER — Ambulatory Visit (HOSPITAL_COMMUNITY): Admitting: Psychiatry

## 2024-01-07 ENCOUNTER — Ambulatory Visit: Admitting: Family Medicine

## 2024-01-08 ENCOUNTER — Other Ambulatory Visit (HOSPITAL_COMMUNITY): Payer: Self-pay | Admitting: Psychiatry

## 2024-01-08 DIAGNOSIS — F411 Generalized anxiety disorder: Secondary | ICD-10-CM

## 2024-01-08 DIAGNOSIS — F319 Bipolar disorder, unspecified: Secondary | ICD-10-CM

## 2024-01-08 DIAGNOSIS — F4310 Post-traumatic stress disorder, unspecified: Secondary | ICD-10-CM

## 2024-01-12 ENCOUNTER — Other Ambulatory Visit: Payer: Self-pay | Admitting: Sports Medicine

## 2024-01-12 DIAGNOSIS — R200 Anesthesia of skin: Secondary | ICD-10-CM

## 2024-01-12 DIAGNOSIS — R270 Ataxia, unspecified: Secondary | ICD-10-CM

## 2024-01-12 DIAGNOSIS — R296 Repeated falls: Secondary | ICD-10-CM

## 2024-01-14 ENCOUNTER — Telehealth: Payer: Self-pay

## 2024-01-14 NOTE — Telephone Encounter (Signed)
 Copied from CRM [id number]. Topic: General - Other >> Jan 13, 2024  4:12 PM Thersia BROCKS wrote: Reason for CRM: Patient called in stated she needs a doctor letter regarding her apartment to move in needs a letter stated she has neuropathy and needs walker, once get walker is able to be moved in 2 room apartment

## 2024-01-14 NOTE — Telephone Encounter (Signed)
 Copied from CRM [id number]. Topic: General - Call Back - No Documentation >> Jan 14, 2024  9:47 AM Harlene ORN wrote: Reason for CRM: Patient is returning a call from Nurse Chiquita. Please call back the patient to discuss.

## 2024-01-14 NOTE — Telephone Encounter (Signed)
 I didn't call pt yet.

## 2024-01-14 NOTE — Telephone Encounter (Signed)
 ATC pt for clarification on what letter needs to say but was given message the person you are trying to reach does not have a VM box set up yet unable to LM

## 2024-01-17 NOTE — Telephone Encounter (Signed)
 Per Boby- pt needs to f/u w her in order to discuss letter needed

## 2024-01-18 ENCOUNTER — Encounter: Payer: Self-pay | Admitting: Internal Medicine

## 2024-01-18 NOTE — Progress Notes (Unsigned)
 Subjective:    Patient ID: Shannon [name], female    DOB: 04/05/1965, 58 y.o.   MRN: [id number]     HPI Shannon [name] is here for follow up from the hospital.   Discussed the use of AI scribe software for clinical note transcription with the patient, who gave verbal consent to proceed.  History of Present Illness Shannon [name] is a 58 year old female with a history of falls and bipolar disorder who presents with acute pain following recent falls.  She was recently admitted to the hospital from Thursday to Saturday, during which she was in the ICU. Her falls begin with lightheadedness upon standing, followed by wobbly legs, leading to loss of consciousness and falls. She fell three times in one day and reports having previously fractured her left leg. She also sustained injuries to her tailbone and right shoulder; she has a history of a rotator cuff injury.  She describes significant pain in her tailbone area, stating 'right in between here, right down deep in there.' She has been using Bengay cream for pain relief and adjusts her sitting position to alleviate discomfort. She has not been taking any other pain medications recently.  She has a history of bipolar disorder and severe anxiety attacks, managed by Dr. Leni. She also suffers from neuropathy, which affects her ability to feel the bottom of her feet.   She experiences lightheadedness and dizziness upon standing, which has not improved despite moving more slowly. She experiences occasional palpitations and shortness of breath. She does not monitor her blood pressure at home.  She mentions a history of kidney issues noted during a recent MRI, but was not told the specifics.  She experiences occasional diarrhea, which she attributes to coffee consumption. No chest pain, fever, or headaches. She reports occasional leg swelling, which worsens with prolonged sitting.     Medications and allergies reviewed with patient and  updated if appropriate.  Current Outpatient Medications on File Prior to Visit  Medication Sig Dispense Refill   AMBULATORY NON FORMULARY MEDICATION Walking cane R20.0 R20.2 1 Units 0   atorvastatin  (LIPITOR) 20 MG tablet Take 1 tablet (20 mg total) by mouth daily. 90 tablet 1   busPIRone  (BUSPAR ) 5 MG tablet Take 1 tablet (5 mg total) by mouth 2 (two) times daily. 60 tablet 1   Cholecalciferol  (VITAMIN D3) 50 MCG (2000 UT) capsule Take 1 capsule (2,000 Units total) by mouth daily. 90 capsule 1   clonazePAM  (KLONOPIN ) 0.5 MG tablet Take 1 tablet (0.5 mg total) by mouth in the morning, at noon, and at bedtime. 90 tablet 2   cyanocobalamin  1000 MCG tablet Take 1 tablet (1,000 mcg total) by mouth daily. 30 tablet 0   FLUoxetine  (PROZAC ) 20 MG capsule Take 1 capsule (20 mg total) by mouth daily. (Patient taking differently: Take 25 mg by mouth daily.) 30 capsule 2   gabapentin  (NEURONTIN ) 400 MG capsule take TWO capsules BY MOUTH THREE TIMES DAILY 180 capsule 1   Multiple Vitamins-Minerals (BARIATRIC MULTIVITAMINS/IRON ) CAPS Take 1 capsule by mouth daily. 30 capsule 2   pantoprazole  (PROTONIX ) 40 MG tablet Take 1 tablet (40 mg total) by mouth 2 (two) times daily before a meal. 180 tablet 1   thiamine  (VITAMIN B1) 100 MG tablet Take 1 tablet (100 mg total) by mouth daily. 30 tablet 0   topiramate  (TOPAMAX ) 50 MG tablet Take 50 mg by mouth 2 (two) times daily.     traMADol  (ULTRAM ) 50 MG  tablet Take 1 tablet (50 mg total) by mouth every 6 (six) hours as needed for severe pain (pain score 7-10). 20 tablet 1   valsartan -hydrochlorothiazide  (DIOVAN -HCT) 160-12.5 MG tablet Take 1 tablet by mouth daily. 90 tablet 1   potassium chloride  SA (KLOR-CON  M) 20 MEQ tablet TAKE 1 TABLET (20 MEQ TOTAL) BY MOUTH DAILY. 90 tablet 0   QUEtiapine  (SEROQUEL ) 25 MG tablet Take 1 tablet (25 mg total) by mouth 2 (two) times daily. 60 tablet 1   QUEtiapine  (SEROQUEL ) 400 MG tablet Take 1 tablet (400 mg total) by mouth at  bedtime. 30 tablet 1   No current facility-administered medications on file prior to visit.     Review of Systems  Constitutional:  Negative for fever.  Respiratory:  Positive for shortness of breath (occ).   Cardiovascular:  Positive for palpitations (occ - chronic). Negative for chest pain.  Gastrointestinal:  Positive for diarrhea (a little - too much coffee). Negative for constipation.  Neurological:  Positive for dizziness and light-headedness. Negative for headaches.       Objective:   Vitals:   01/19/24 0900  BP: 122/80  Pulse: 90  Temp: 98.3 F (36.8 C)  SpO2: 98%   BP Readings from Last 3 Encounters:  01/19/24 122/80  11/26/23 (!) 150/70  03/11/23 132/74   Wt Readings from Last 3 Encounters:  01/19/24 236 lb (107 kg)  11/26/23 224 lb (101.6 kg)  03/11/23 201 lb (91.2 kg)   Body mass index is 40.51 kg/m.    Physical Exam Constitutional:      General: She is not in acute distress.    Appearance: Normal appearance. She is not ill-appearing.  HENT:     Head: Normocephalic and atraumatic.  Cardiovascular:     Rate and Rhythm: Normal rate and regular rhythm.  Pulmonary:     Effort: Pulmonary effort is normal. No respiratory distress.     Breath sounds: Normal breath sounds. No wheezing or rales.  Musculoskeletal:        General: Tenderness (Lower sacral-coccyx) present.     Right lower leg: No edema.     Left lower leg: No edema.  Skin:    General: Skin is warm and dry.     Findings: No rash.  Neurological:     Mental Status: She is alert.        Lab Results  Component Value Date   WBC 7.1 11/26/2023   HGB 10.7 (L) 11/26/2023   HCT 33.0 (L) 11/26/2023   PLT 351.0 11/26/2023   GLUCOSE 94 11/26/2023   CHOL 272 (H) 11/26/2023   TRIG 150.0 (H) 11/26/2023   HDL 67.20 11/26/2023   LDLDIRECT 195.6 10/26/2012   LDLCALC 175 (H) 11/26/2023   ALT 18 11/26/2023   AST 15 11/26/2023   NA 139 11/26/2023   K 4.4 11/26/2023   CL 102 11/26/2023    CREATININE 0.80 11/26/2023   BUN 8 11/26/2023   CO2 31 11/26/2023   TSH 2.09 11/26/2023   INR 1.0 01/29/2023   HGBA1C 5.9 11/26/2023     Assessment & Plan:    See Problem List for Assessment and Plan of chronic medical problems.   Assessment and Plan Assessment & Plan Sacrococcygeal pain after recent fall Severe pain likely due to fracture or contusion, not amenable to surgery. - Advised offloading pressure using a donut pillow. - Sit on soft surfaces - Prescribed short course of pain medication, cautioning about constipation and drowsiness.  Stressed that this is short-term  pain medication only - Encouraged Tylenol  or ibuprofen  use, cautioning against overuse. - Deferred imaging since it would not change treatment - Has upcoming appointment with PCP  Recurrent falls with syncope and orthostatic symptoms Likely due to orthostatic hypotension, symptoms persist despite slower movements. - Revised blood pressure medication. - Prescribed blood pressure cuff for home monitoring. - Advised lifestyle modifications to prevent falls. - Discussed standing up slowly and if the lightheadedness does not resolve to sit back down.  Also discussed pumping legs before standing  Essential hypertension with orthostatic hypotension Episodes of orthostatic hypotension, valsartan  hydrochlorothiazide  may contribute to low standing BP. - Discontinued valsartan  hydrochlorothiazide . - Initiated valsartan  160 mg monotherapy. - Monitor and record blood pressure at home-blood pressure cuff prescription sent to pharmacy. - Bring readings in with her upcoming appoint with her PCP to review   Peripheral neuropathy Chronic neuropathy with significant sensory loss in feet.  Lower extremity edema Intermittent edema likely due to prolonged sitting. - No edema on exam today - Advised elevating legs when sitting, which she does not do. - Recommended compression socks, which would help with the edema in  addition to the orthostatic hypotension.

## 2024-01-19 ENCOUNTER — Ambulatory Visit: Admitting: Internal Medicine

## 2024-01-19 VITALS — BP 122/80 | HR 90 | Temp 98.3°F | Ht 64.0 in | Wt 236.0 lb

## 2024-01-19 DIAGNOSIS — I10 Essential (primary) hypertension: Secondary | ICD-10-CM | POA: Diagnosis not present

## 2024-01-19 DIAGNOSIS — M533 Sacrococcygeal disorders, not elsewhere classified: Secondary | ICD-10-CM | POA: Diagnosis not present

## 2024-01-19 DIAGNOSIS — I951 Orthostatic hypotension: Secondary | ICD-10-CM | POA: Diagnosis not present

## 2024-01-19 DIAGNOSIS — R600 Localized edema: Secondary | ICD-10-CM | POA: Diagnosis not present

## 2024-01-19 DIAGNOSIS — R296 Repeated falls: Secondary | ICD-10-CM

## 2024-01-19 MED ORDER — TRAMADOL HCL 50 MG PO TABS
50.0000 mg | ORAL_TABLET | Freq: Three times a day (TID) | ORAL | 0 refills | Status: AC | PRN
Start: 2024-01-19 — End: 2024-01-24

## 2024-01-19 MED ORDER — VALSARTAN 160 MG PO TABS: 160.0000 mg | ORAL_TABLET | Freq: Every day | ORAL | 3 refills | Status: AC

## 2024-01-19 MED ORDER — BLOOD PRESSURE MONITOR/ARM DEVI: 0 refills | Status: AC

## 2024-01-19 NOTE — Patient Instructions (Addendum)
     Medications changes include :   stop valsartan  -hydrochlorothiazide  for now.   Start valsartan  160 mg daily.   Start tramadol  50 mg three times a day for severe pain.      Monitor your BP - keep a log.     Return for follow up as scheduled.

## 2024-01-24 ENCOUNTER — Encounter: Payer: Self-pay | Admitting: Family Medicine

## 2024-01-25 ENCOUNTER — Telehealth: Payer: Self-pay

## 2024-01-25 NOTE — Telephone Encounter (Signed)
 Copied from CRM #[id number]. Topic: General - Call Back - No Documentation >> Jan 24, 2024 11:41 AM Tonda B wrote: Reason for CRM: patient is calling in asking that she gets a call back about her [place] [employer] she needs a letter stating that [place] no longer in [place] but now in [place] patient says she needs this asap  please call pt back [phone] (H)

## 2024-01-25 NOTE — Telephone Encounter (Signed)
 LM for pt to let us  know where letter needs to be sent to

## 2024-01-27 ENCOUNTER — Ambulatory Visit (HOSPITAL_COMMUNITY): Admitting: Psychiatry

## 2024-01-27 ENCOUNTER — Other Ambulatory Visit: Payer: Self-pay

## 2024-01-27 ENCOUNTER — Encounter (HOSPITAL_COMMUNITY): Payer: Self-pay | Admitting: *Deleted

## 2024-01-27 ENCOUNTER — Encounter (HOSPITAL_COMMUNITY): Payer: Self-pay | Admitting: Psychiatry

## 2024-01-27 ENCOUNTER — Ambulatory Visit: Admitting: Sports Medicine

## 2024-01-27 VITALS — BP 130/73 | HR 121 | Ht 64.0 in | Wt 236.0 lb

## 2024-01-27 VITALS — HR 104 | Ht 64.0 in | Wt 236.0 lb

## 2024-01-27 DIAGNOSIS — R270 Ataxia, unspecified: Secondary | ICD-10-CM

## 2024-01-27 DIAGNOSIS — F609 Personality disorder, unspecified: Secondary | ICD-10-CM

## 2024-01-27 DIAGNOSIS — F319 Bipolar disorder, unspecified: Secondary | ICD-10-CM | POA: Diagnosis not present

## 2024-01-27 DIAGNOSIS — R296 Repeated falls: Secondary | ICD-10-CM | POA: Diagnosis not present

## 2024-01-27 DIAGNOSIS — F4310 Post-traumatic stress disorder, unspecified: Secondary | ICD-10-CM | POA: Diagnosis not present

## 2024-01-27 DIAGNOSIS — M2550 Pain in unspecified joint: Secondary | ICD-10-CM | POA: Diagnosis not present

## 2024-01-27 DIAGNOSIS — R202 Paresthesia of skin: Secondary | ICD-10-CM

## 2024-01-27 DIAGNOSIS — R200 Anesthesia of skin: Secondary | ICD-10-CM

## 2024-01-27 DIAGNOSIS — F411 Generalized anxiety disorder: Secondary | ICD-10-CM | POA: Diagnosis not present

## 2024-01-27 DIAGNOSIS — R700 Elevated erythrocyte sedimentation rate: Secondary | ICD-10-CM

## 2024-01-27 DIAGNOSIS — R7689 Other specified abnormal immunological findings in serum: Secondary | ICD-10-CM

## 2024-01-27 MED ORDER — HYDROXYZINE PAMOATE 25 MG PO CAPS: 25.0000 mg | ORAL_CAPSULE | Freq: Two times a day (BID) | ORAL | 1 refills | Status: DC | PRN

## 2024-01-27 MED ORDER — FLUOXETINE HCL 20 MG PO CAPS: 20.0000 mg | ORAL_CAPSULE | Freq: Every day | ORAL | 1 refills | Status: DC

## 2024-01-27 MED ORDER — ARIPIPRAZOLE 5 MG PO TABS: 5.0000 mg | ORAL_TABLET | Freq: Every day | ORAL | 1 refills | Status: DC

## 2024-01-27 MED ORDER — QUETIAPINE FUMARATE 400 MG PO TABS: 400.0000 mg | ORAL_TABLET | Freq: Every day | ORAL | 1 refills | Status: DC

## 2024-01-27 NOTE — Progress Notes (Signed)
 Ben Zanyla Klebba D.[facility] [address] [place] [address] Phone: [phone]   Assessment and Plan:     1. Numbness and tingling of both lower extremities (Primary) 2. Numbness and tingling of both upper extremities 3. Ataxia 4. Falls frequently 5. Polyarthralgia 6. Elevated sed rate 7. Elevated antinuclear antibody (ANA) level -Chronic with exacerbation, subsequent visit - Patient continues to experience numbness and tingling in all extremities that is progressive, becoming more painful, limiting day-to-day activities and ADLs.  I currently have no exhalation for patient's symptoms.  Patient is not diabetic.  Brain MRI, C-spine MRI, lumbar spine MRI have been relatively unremarkable.   -Lab work showed negative ANA, but with ENA SSB ([place]) antibody elevation at 3.5, elevated sed rate.  Recommend further evaluation with rheumatology.  Referral sent - With continued paresthesias and progressive weakness, recommend further evaluation with neurology.  Referral sent - Patient is taking potassium and thiamine  supplementation and most lab work showed values within normal limits.  Recommend continuing to follow-up with PCP - Patient has had some symptomatic relief in lower extremities with gabapentin  800 mg 3 times daily.  May continue this medication - Recommend starting physical therapy for overall strength and conditioning to decrease falls -Provided patient with paperwork stating that patient has been seen in our clinic for continued and ongoing care.  Pertinent previous records reviewed include none   Follow Up: As needed for reevaluation   Subjective:    Chief Complaint: Numbness and tingling in all extremities, progressive weakness, continued pain  HPI:   01/27/24 Patient is a 58 year old female she is asking for a letter stating that she should return to [employer] for housing.Patient has multiple musculoskeletal issues    Relevant  Historical Information: Bipolar 1, vitamin deficiency, housing insecurity  Additional pertinent review of systems negative.   Current Outpatient Medications:    AMBULATORY NON FORMULARY MEDICATION, Walking cane R20.0 R20.2, Disp: 1 Units, Rfl: 0   atorvastatin  (LIPITOR) 20 MG tablet, Take 1 tablet (20 mg total) by mouth daily., Disp: 90 tablet, Rfl: 1   Blood Pressure Monitoring (BLOOD PRESSURE MONITOR/ARM) DEVI, UAD to monitor BP at home. Dx I10, Disp: 1 each, Rfl: 0   busPIRone  (BUSPAR ) 5 MG tablet, Take 1 tablet (5 mg total) by mouth 2 (two) times daily., Disp: 60 tablet, Rfl: 1   Cholecalciferol  (VITAMIN D3) 50 MCG (2000 UT) capsule, Take 1 capsule (2,000 Units total) by mouth daily., Disp: 90 capsule, Rfl: 1   clonazePAM  (KLONOPIN ) 0.5 MG tablet, Take 1 tablet (0.5 mg total) by mouth in the morning, at noon, and at bedtime., Disp: 90 tablet, Rfl: 2   cyanocobalamin  1000 MCG tablet, Take 1 tablet (1,000 mcg total) by mouth daily., Disp: 30 tablet, Rfl: 0   FLUoxetine  (PROZAC ) 20 MG capsule, Take 1 capsule (20 mg total) by mouth daily. (Patient taking differently: Take 25 mg by mouth daily.), Disp: 30 capsule, Rfl: 2   gabapentin  (NEURONTIN ) 400 MG capsule, take TWO capsules BY MOUTH THREE TIMES DAILY, Disp: 180 capsule, Rfl: 1   Multiple Vitamins-Minerals (BARIATRIC MULTIVITAMINS/IRON ) CAPS, Take 1 capsule by mouth daily., Disp: 30 capsule, Rfl: 2   pantoprazole  (PROTONIX ) 40 MG tablet, Take 1 tablet (40 mg total) by mouth 2 (two) times daily before a meal., Disp: 180 tablet, Rfl: 1   potassium chloride  SA (KLOR-CON  M) 20 MEQ tablet, TAKE 1 TABLET (20 MEQ TOTAL) BY MOUTH DAILY., Disp: 90 tablet, Rfl: 0   QUEtiapine  (  SEROQUEL ) 25 MG tablet, Take 1 tablet (25 mg total) by mouth 2 (two) times daily., Disp: 60 tablet, Rfl: 1   QUEtiapine  (SEROQUEL ) 400 MG tablet, Take 1 tablet (400 mg total) by mouth at bedtime., Disp: 30 tablet, Rfl: 1   thiamine  (VITAMIN B1) 100 MG tablet, Take 1 tablet (100 mg  total) by mouth daily., Disp: 30 tablet, Rfl: 0   topiramate  (TOPAMAX ) 50 MG tablet, Take 50 mg by mouth 2 (two) times daily., Disp: , Rfl:    valsartan  (DIOVAN ) 160 MG tablet, Take 1 tablet (160 mg total) by mouth daily., Disp: 90 tablet, Rfl: 3   Objective:     Vitals:   01/27/24 1007  Pulse: (!) 104  SpO2: 96%  Weight: 236 lb (107 kg)  Height: 5' 4 (1.626 m)      Body mass index is 40.51 kg/m.    Physical Exam:    General: Well-appearing, cooperative, sitting comfortably in no acute distress.   HEENT: Normocephalic, atraumatic.   Neck: No gross abnormality.  Cardiovascular: No pallor or cyanosis. Resp: Comfortable WOB.   Abdomen: Non distended.   Skin: Warm and dry; no focal rashes identified on limited exam. Extremities: No cyanosis or edema.  Neuro: Gross motor and sensory intact.  Using provided wheelchair at today's visit.  Describes increased sensitivity with light palpation to hands and feet, and overall decreased sensation to light palpation of all extremities Psychiatric: Mood and affect are appropriate.    Electronically signed by:  Odis Mace D.[facility] 11:46 AM 01/27/24
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
## 2024-01-27 NOTE — Patient Instructions (Signed)
 Patient has been seen with us  for medical conditions and on going care.   PT referral   Rheumatology referral   Neurology referral   As needed follow up

## 2024-02-02 ENCOUNTER — Encounter: Payer: Self-pay | Admitting: Family Medicine

## 2024-02-02 ENCOUNTER — Ambulatory Visit: Admitting: Family Medicine

## 2024-02-02 VITALS — BP 124/62 | HR 116 | Temp 98.6°F | Ht 64.0 in | Wt 228.8 lb

## 2024-02-02 DIAGNOSIS — R296 Repeated falls: Secondary | ICD-10-CM

## 2024-02-02 DIAGNOSIS — I951 Orthostatic hypotension: Secondary | ICD-10-CM

## 2024-02-02 DIAGNOSIS — E538 Deficiency of other specified B group vitamins: Secondary | ICD-10-CM | POA: Diagnosis not present

## 2024-02-02 DIAGNOSIS — G629 Polyneuropathy, unspecified: Secondary | ICD-10-CM | POA: Diagnosis not present

## 2024-02-02 DIAGNOSIS — E559 Vitamin D deficiency, unspecified: Secondary | ICD-10-CM | POA: Diagnosis not present

## 2024-02-02 DIAGNOSIS — E519 Thiamine deficiency, unspecified: Secondary | ICD-10-CM | POA: Diagnosis not present

## 2024-02-02 DIAGNOSIS — D649 Anemia, unspecified: Secondary | ICD-10-CM | POA: Diagnosis not present

## 2024-02-02 DIAGNOSIS — F319 Bipolar disorder, unspecified: Secondary | ICD-10-CM

## 2024-02-02 DIAGNOSIS — Z599 Problem related to housing and economic circumstances, unspecified: Secondary | ICD-10-CM

## 2024-02-02 DIAGNOSIS — Z9181 History of falling: Secondary | ICD-10-CM | POA: Diagnosis not present

## 2024-02-02 NOTE — Progress Notes (Addendum)
 Subjective:     Patient ID: Shannon [name], female    DOB: 1965/07/16, 58 y.o.   MRN: [id number]  Chief Complaint  Patient presents with   Follow-up    Hospital f/u c/o feeling lightheaded now has been off and on for was hospitalized monday Needing housing letter sent [place] housing authority [phone] [name]    HPI  Discussed the use of AI scribe software for clinical note transcription with the patient, who gave verbal consent to proceed.  History of Present Illness Shannon [name] is a 58 year old female who presents with dizziness and inability to stand.  Dizziness and gait instability - Severe dizziness with inability to stand - Arrived in a wheelchair due to inability to ambulate - Recent falls without known fractures - Orthostatic symptoms present  Visual disturbance - Worsening vision  Peripheral neuropathy - Inability to feel her feet, consistent with neuropathy  Headache - Headache present - No chest pain, palpitations, or shortness of breath  Recent hospitalization and cardiac findings - Recently hospitalized in [place], [place]  at [name] per patient. No documentation available today - Informed of fluid around her heart during hospitalization - Left hospital against medical advice after a manic episode  Nutritional deficiencies and anemia - Thiamine , folate, vitamin D , and vitamin B12 deficiencies - Anemia present - Unclear if currently taking supplements   Health Maintenance Due  Topic Date Due   Hepatitis B Vaccines 19-59 Average Risk (1 of 3 - 19+ 3-dose series) Never done   Pneumococcal Vaccine: 50+ Years (2 of 2 - PCV) 10/26/2013   Mammogram  04/22/2014   DTaP/Tdap/Td (2 - Tdap) 02/28/2019   COVID-19 Vaccine (3 - Pfizer risk series) 01/08/2020   Fecal DNA (Cologuard)  01/10/2020   Medicare Annual Wellness (AWV)  10/08/2022    Past Medical History:  Diagnosis Date   Allergic rhinitis    Allergic rhinitis 01/25/2009         ANEMIA-IRON  DEFICIENCY 01/25/2009   Anxiety    Backache 01/25/2009   Qualifier: Diagnosis of  By: Norleen MD, [facility] ORN    Bipolar 1 disorder [name])    Chronic pain syndrome    Complete rotator cuff tear 07/21/2013   Complete tear of right rotator cuff 07/21/2013   Contact lens/glasses fitting    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   DISC DISEASE, LUMBAR 01/25/2009   Essential hypertension 01/25/2009   On Bystolic     GAD (generalized anxiety disorder) 12/28/2013   GERD (gastroesophageal reflux disease)    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   Headache, acute 12/20/2013   Hx of laparoscopic gastric banding 09/03/2010   Surgery date: 07/17/10    HYPERLIPIDEMIA 01/25/2009   Hyperlipidemia with target LDL less than 130 01/25/2009   HYPERTENSION 01/25/2009   Hypokalemia, inadequate intake 09/01/2011   Insomnia 04/04/2011   INSOMNIA-SLEEP DISORDER-UNSPEC 04/30/2009   Qualifier: Diagnosis of  By: Norleen MD, [facility] ORN    Iron  deficiency anemia 01/25/2009        Labyrinthitis 02/19/2014   Leiomyoma of uterus, unspecified 08/13/2008   Overview:  Leiomyoma Of The Uterus  10/1 IMO update   Localized edema 06/12/2015   MANIC DEPRESSIVE ILLNESS 01/25/2009   pt is unsue if this is her specifc dx   Mixed bipolar I disorder (HCC) 07/12/2012   Morbid obesity (HCC) 01/25/2009   Night sweats    Osteopenia determined by x-ray 06/13/2021   Other abnormal glucose 10/26/2012   Palpitations 10/25/2013   PEPTIC ULCER DISEASE,  HELICOBACTER PYLORI POSITIVE 10/03/2009   Peripheral edema 06/25/2015   Pernicious anemia 03/28/2012   Piriformis syndrome of both sides 09/02/2018   PUD (peptic ulcer disease) 10/03/2009        Right shoulder pain 05/03/2013   Dg Shoulder Right  05/03/2013   CLINICAL DATA Pain.  EXAM RIGHT SHOULDER - 2+ VIEW  COMPARISON None.  FINDINGS Acromioclavicular and glenohumeral degenerative change present. Questionable calcific density noted in the region of the supraspinatus space, possibly representing  calcific supraspinatus tendinitis. This could represent a sclerotic density in the acromion. MRI of the right shoulder suggest   Sleep apnea 08/12/2016   SMOKER 01/25/2009   Qualifier: Diagnosis of  By: Norleen MD, Lynwood ORN    Subacromial bursitis 05/17/2013   SUBSTANCE ABUSE 11/06/2009        Thiamin deficiency 10/30/2012   Tobacco abuse 06/09/2010   Visual disturbance 05/03/2013   Vitamin D  deficiency 10/27/2012    Past Surgical History:  Procedure Laterality Date   ABDOMINAL HYSTERECTOMY     BLADDER SURGERY     s/p with ?diverticulitis   HAND TENDON SURGERY  1991   s/p-Right-index and middle   LAPAROSCOPIC GASTRIC BAND REMOVAL WITH LAPAROSCOPIC GASTRIC SLEEVE RESECTION N/A 08/17/2016   Procedure: LAPAROSCOPIC GASTRIC BAND REMOVAL WITH LAPAROSCOPIC GASTRIC SLEEVE RESECTION WITH UPPER ENDO;  Surgeon: Ethyl Lenis, MD;  Location: [facility];  Service: General;  Laterality: N/A;   LAPAROSCOPIC GASTRIC BANDING  07/14/10   SHOULDER ARTHROSCOPY Right 07/21/2013   Procedure: RIGHT ARTHROSCOPY SHOULDER DEBRIDMENT EXTENTSIVE,ARTHROSCOPIC REMOVE LOOSE FOREIGN BODY, BICEPS TENOLYSIS ;  Surgeon: Fonda SHAUNNA Olmsted, MD;  Location: [name];  Service: Orthopedics;  Laterality: Right;   TUBAL LIGATION      Family History  Problem Relation Age of Onset   Hypertension Mother    Stroke Father    Cirrhosis Father        ETOH   Hypertension Father    Diabetes Father    Alcohol abuse Father    Hypertension Sister    Asthma Sister    Hypertension Brother    Hypertension Paternal Aunt    Diabetes Maternal Grandmother    ADD / ADHD Son    ADD / ADHD Other        3 nephews, also emotional issues   Diabetes Other        Uncle   Diabetes Other        Aunt   Suicidality Neg Hx     Social History   Socioeconomic History   Marital status: Divorced    Spouse name: Not on file   Number of children: 2   Years of education: Not on file   Highest education level: Professional school degree (e.g.,  MD, DDS, DVM, JD)  Occupational History   Occupation: STUDENT    Employer: UNEMPLOYED  Tobacco Use   Smoking status: Every Day    Current packs/day: 0.25    Average packs/day: 0.3 packs/day for 20.0 years (5.0 ttl pk-yrs)    Types: Cigarettes   Smokeless tobacco: Never   Tobacco comments:    States has cut back to 5-7 a day and working on this.   Vaping Use   Vaping status: Never Used  Substance and Sexual Activity   Alcohol use: No    Alcohol/week: 0.0 standard drinks of alcohol   Drug use: No   Sexual activity: Not Currently  Other Topics Concern   Not on file  Social History Narrative   Moved from  [place] , then [place] to [place] 2009   2 children-1 boy, 1 girl   Disabled-bipolar   Daily Caffeine Use-1 cup/day      Right Handed   Lives in 2nd floor condo   Social Drivers of Health   Financial Resource Strain: Medium Risk (10/13/2023)   Overall Financial Resource Strain (CARDIA)    Difficulty of Paying Living Expenses: Somewhat hard  Food Insecurity: Food Insecurity Present (11/18/2023)   Hunger Vital Sign    Worried About Running Out of Food in the Last Year: Sometimes true    Ran Out of Food in the Last Year: Sometimes true  Transportation Needs: Unmet Transportation Needs (11/18/2023)   PRAPARE - Transportation    Lack of Transportation (Medical): Yes    Lack of Transportation (Non-Medical): Yes  Physical Activity: Inactive (03/12/2023)   Exercise Vital Sign    Days of Exercise per Week: 0 days    Minutes of Exercise per Session: 0 min  Stress: No Stress Concern Present (10/13/2023)   [employer] of Occupational Health - Occupational Stress Questionnaire    Feeling of Stress: Only a little  Social Connections: Moderately Integrated (11/18/2023)   Social Connection and Isolation Panel    Frequency of Communication with Friends and Family: More than three times a week    Frequency of Social Gatherings with Friends and Family: Not on file    Attends Religious  Services: More than 4 times per year    Active Member of [employer] or Organizations: Yes    Attends Banker Meetings: Not on file    Marital Status: Separated  Recent Concern: Social Connections - Moderately Isolated (10/13/2023)   Social Connection and Isolation Panel    Frequency of Communication with Friends and Family: More than three times a week    Frequency of Social Gatherings with Friends and Family: Once a week    Attends Religious Services: More than 4 times per year    Active Member of [employer] or Organizations: No    Attends Banker Meetings: Never    Marital Status: Separated  Intimate Partner Violence: Not At Risk (06/23/2023)   Humiliation, Afraid, Rape, and Kick questionnaire    Fear of Current or Ex-Partner: No    Emotionally Abused: No    Physically Abused: No    Sexually Abused: No    Outpatient Medications Prior to Visit  Medication Sig Dispense Refill   atorvastatin  (LIPITOR) 20 MG tablet Take 1 tablet (20 mg total) by mouth daily. 90 tablet 1   Blood Pressure Monitoring (BLOOD PRESSURE MONITOR/ARM) DEVI UAD to monitor BP at home. Dx I10 1 each 0   busPIRone  (BUSPAR ) 5 MG tablet Take 1 tablet (5 mg total) by mouth 2 (two) times daily. 60 tablet 1   clonazePAM  (KLONOPIN ) 0.5 MG tablet Take 1 tablet (0.5 mg total) by mouth in the morning, at noon, and at bedtime. 90 tablet 2   gabapentin  (NEURONTIN ) 400 MG capsule take TWO capsules BY MOUTH THREE TIMES DAILY 180 capsule 1   hydrOXYzine  (VISTARIL ) 25 MG capsule Take 1 capsule (25 mg total) by mouth 2 (two) times daily as needed for itching. 60 capsule 1   AMBULATORY NON FORMULARY MEDICATION Walking cane R20.0 R20.2 (Patient not taking: Reported on 02/02/2024) 1 Units 0   ARIPiprazole  (ABILIFY ) 5 MG tablet Take 1 tablet (5 mg total) by mouth daily. (Patient not taking: Reported on 02/02/2024) 30 tablet 1   Cholecalciferol  (VITAMIN D3) 50 MCG (2000 UT) capsule  Take 1 capsule (2,000 Units total) by  mouth daily. 90 capsule 1   cyanocobalamin  1000 MCG tablet Take 1 tablet (1,000 mcg total) by mouth daily. (Patient not taking: Reported on 02/02/2024) 30 tablet 0   FLUoxetine  (PROZAC ) 20 MG capsule Take 1 capsule (20 mg total) by mouth daily. (Patient not taking: Reported on 02/02/2024) 30 capsule 1   Multiple Vitamins-Minerals (BARIATRIC MULTIVITAMINS/IRON ) CAPS Take 1 capsule by mouth daily. (Patient not taking: Reported on 02/02/2024) 30 capsule 2   pantoprazole  (PROTONIX ) 40 MG tablet Take 1 tablet (40 mg total) by mouth 2 (two) times daily before a meal. (Patient not taking: Reported on 02/02/2024) 180 tablet 1   potassium chloride  SA (KLOR-CON  M) 20 MEQ tablet TAKE 1 TABLET (20 MEQ TOTAL) BY MOUTH DAILY. (Patient not taking: Reported on 02/02/2024) 90 tablet 0   QUEtiapine  (SEROQUEL ) 400 MG tablet Take 1 tablet (400 mg total) by mouth at bedtime. (Patient not taking: Reported on 02/02/2024) 30 tablet 1   thiamine  (VITAMIN B1) 100 MG tablet Take 1 tablet (100 mg total) by mouth daily. (Patient not taking: Reported on 02/02/2024) 30 tablet 0   topiramate  (TOPAMAX ) 50 MG tablet Take 50 mg by mouth 2 (two) times daily. (Patient not taking: Reported on 02/02/2024)     valsartan  (DIOVAN ) 160 MG tablet Take 1 tablet (160 mg total) by mouth daily. (Patient not taking: Reported on 02/02/2024) 90 tablet 3   No facility-administered medications prior to visit.    Allergies  Allergen Reactions   Porcine (Pork) Protein-Containing Drug Products Other (See Comments) and Anaphylaxis    Religious Reasons (Muslim)   Lamictal  [Lamotrigine ] Rash    Review of Systems  Constitutional:  Positive for malaise/fatigue. Negative for chills and fever.  Eyes:  Positive for blurred vision and photophobia.  Respiratory:  Negative for shortness of breath.   Cardiovascular:  Negative for chest pain, palpitations and leg swelling.  Gastrointestinal:  Negative for abdominal pain, constipation, diarrhea, nausea and  vomiting.  Genitourinary:  Negative for dysuria, frequency and urgency.  Musculoskeletal:  Positive for falls, joint pain and myalgias.  Neurological:  Positive for dizziness and headaches. Negative for focal weakness.  Psychiatric/Behavioral:  Positive for depression. The patient is nervous/anxious.        Objective:    Physical Exam Constitutional:      General: She is not in acute distress.    Appearance: She is not ill-appearing.  Eyes:     Extraocular Movements: Extraocular movements intact.     Conjunctiva/sclera: Conjunctivae normal.  Cardiovascular:     Rate and Rhythm: Normal rate.  Pulmonary:     Effort: Pulmonary effort is normal.  Musculoskeletal:     Cervical back: Normal range of motion and neck supple.  Skin:    General: Skin is warm and dry.  Neurological:     General: No focal deficit present.     Mental Status: She is alert and oriented to person, place, and time.  Psychiatric:        Mood and Affect: Mood normal.        Behavior: Behavior normal.        Thought Content: Thought content normal.      BP 124/62 (BP Location: Left Arm, Patient Position: Standing)   Pulse (!) 116   Temp 98.6 F (37 C)   Ht 5' 4 (1.626 m)   Wt 228 lb 12.8 oz (103.8 kg)   SpO2 96%   BMI 39.27 kg/m  Wt Readings from  Last 3 Encounters:  02/02/24 228 lb 12.8 oz (103.8 kg)  01/27/24 236 lb (107 kg)  01/19/24 236 lb (107 kg)       Assessment & Plan:   Problem List Items Addressed This Visit     Anemia   At high risk for falls   Folate deficiency   Housing or economic problem   Thiamin deficiency   Vitamin B12 deficiency   Other Visit Diagnoses       Orthostatic hypotension    -  Primary     Recurrent falls         Neuropathy         Bipolar I disorder (HCC)         Vitamin D  deficiency           Assessment and Plan Assessment & Plan Recurrent falls due to orthostatic hypotension and polyneuropathy Reports recent falls and orthostatic hypotension.  Unable to stand and presents in a wheelchair. Neuropathy with inability to feel feet. Orthostatic hypotension likely contributing to falls. - she is postural currently  - EMS called to transport patient to ED for further evaluation and treatment.   Bipolar I disorder Recent manic episode during hospitalization in [place], [place] . Left against medical advice (AMA).   Vitamin B12, folate, thiamine , and vitamin D  deficiencies Unclear if currently taking supplements.  Anemia History of anemia.  Housing or economic problem Requests a letter for section eight housing and to move back to [place] . - Working on letter for section eight housing  Addendum:  Patient refused transport to hospital via EMS per CMA. She was advised that her health could deteriorate.      I am having Shannon GEANNIE [name] maintain her Bariatric Multivitamins/Iron , cyanocobalamin , thiamine , topiramate , AMBULATORY NON FORMULARY MEDICATION, Vitamin D3, potassium chloride  SA, pantoprazole , clonazePAM , busPIRone , atorvastatin , gabapentin , Blood Pressure Monitor/Arm, valsartan , QUEtiapine , ARIPiprazole , hydrOXYzine , and FLUoxetine .  No orders of the defined types were placed in this encounter.

## 2024-02-03 ENCOUNTER — Encounter: Payer: Self-pay | Admitting: Family Medicine

## 2024-02-07 ENCOUNTER — Other Ambulatory Visit (HOSPITAL_COMMUNITY): Payer: Self-pay

## 2024-02-07 DIAGNOSIS — F609 Personality disorder, unspecified: Secondary | ICD-10-CM

## 2024-02-07 DIAGNOSIS — F4310 Post-traumatic stress disorder, unspecified: Secondary | ICD-10-CM

## 2024-02-07 DIAGNOSIS — F319 Bipolar disorder, unspecified: Secondary | ICD-10-CM

## 2024-02-07 DIAGNOSIS — F411 Generalized anxiety disorder: Secondary | ICD-10-CM

## 2024-02-07 MED ORDER — QUETIAPINE FUMARATE 400 MG PO TABS: 400.0000 mg | ORAL_TABLET | Freq: Every day | ORAL | 1 refills | Status: DC

## 2024-02-07 MED ORDER — ARIPIPRAZOLE 5 MG PO TABS: 5.0000 mg | ORAL_TABLET | Freq: Every day | ORAL | 1 refills | Status: AC

## 2024-02-07 MED ORDER — FLUOXETINE HCL 20 MG PO CAPS: 20.0000 mg | ORAL_CAPSULE | Freq: Every day | ORAL | 1 refills | Status: AC

## 2024-02-07 MED ORDER — HYDROXYZINE PAMOATE 25 MG PO CAPS: 25.0000 mg | ORAL_CAPSULE | Freq: Two times a day (BID) | ORAL | 1 refills | Status: AC | PRN

## 2024-02-08 ENCOUNTER — Ambulatory Visit: Admitting: Family Medicine

## 2024-02-10 ENCOUNTER — Telehealth (HOSPITAL_COMMUNITY): Payer: Self-pay | Admitting: *Deleted

## 2024-02-10 NOTE — Telephone Encounter (Signed)
 error

## 2024-03-08 NOTE — Progress Notes (Unsigned)
 "               Odis Mace D.[facility] [address] [place] [address] Phone: [phone]   Assessment and Plan:     ***    Pertinent previous records reviewed include ***   Follow Up: ***     Subjective:   I, Shannon [name], am serving as a neurosurgeon for [employer]  Chief Complaint: Numbness and tingling in all extremities, progressive weakness, continued pain   HPI:    01/27/24 Patient is a 59 year old female she is asking for a letter stating that she should return to [employer] for housing.Patient has multiple musculoskeletal issues    03/09/2024 Patient states   Relevant Historical Information: Bipolar 1, vitamin deficiency, housing insecurity  Additional pertinent review of systems negative.  Current Medications[1]   Objective:     There were no vitals filed for this visit.    There is no height or weight on file to calculate BMI.    Physical Exam:    ***   Electronically signed by:  Odis Mace D.[facility] 7:26 AM 03/08/2024    [1]  Current Outpatient Medications:    AMBULATORY NON FORMULARY MEDICATION, Walking cane R20.0 R20.2 (Patient not taking: Reported on 02/02/2024), Disp: 1 Units, Rfl: 0   ARIPiprazole  (ABILIFY ) 5 MG tablet, Take 1 tablet (5 mg total) by mouth daily., Disp: 30 tablet, Rfl: 1   atorvastatin  (LIPITOR) 20 MG tablet, Take 1 tablet (20 mg total) by mouth daily., Disp: 90 tablet, Rfl: 1   Blood Pressure Monitoring (BLOOD PRESSURE MONITOR/ARM) DEVI, UAD to monitor BP at home. Dx I10, Disp: 1 each, Rfl: 0   busPIRone  (BUSPAR ) 5 MG tablet, Take 1 tablet (5 mg total) by mouth 2 (two) times daily., Disp: 60 tablet, Rfl: 1   Cholecalciferol  (VITAMIN D3) 50 MCG (2000 UT) capsule, Take 1 capsule (2,000 Units total) by mouth daily., Disp: 90 capsule, Rfl: 1   clonazePAM  (KLONOPIN ) 0.5 MG tablet, Take 1 tablet (0.5 mg total) by mouth in the morning, at noon, and at bedtime.,  Disp: 90 tablet, Rfl: 2   cyanocobalamin  1000 MCG tablet, Take 1 tablet (1,000 mcg total) by mouth daily. (Patient not taking: Reported on 02/02/2024), Disp: 30 tablet, Rfl: 0   FLUoxetine  (PROZAC ) 20 MG capsule, Take 1 capsule (20 mg total) by mouth daily., Disp: 30 capsule, Rfl: 1   gabapentin  (NEURONTIN ) 400 MG capsule, take TWO capsules BY MOUTH THREE TIMES DAILY, Disp: 180 capsule, Rfl: 1   hydrOXYzine  (VISTARIL ) 25 MG capsule, Take 1 capsule (25 mg total) by mouth 2 (two) times daily as needed for itching., Disp: 60 capsule, Rfl: 1   Multiple Vitamins-Minerals (BARIATRIC MULTIVITAMINS/IRON ) CAPS, Take 1 capsule by mouth daily. (Patient not taking: Reported on 02/02/2024), Disp: 30 capsule, Rfl: 2   pantoprazole  (PROTONIX ) 40 MG tablet, Take 1 tablet (40 mg total) by mouth 2 (two) times daily before a meal. (Patient not taking: Reported on 02/02/2024), Disp: 180 tablet, Rfl: 1   potassium chloride  SA (KLOR-CON  M) 20 MEQ tablet, TAKE 1 TABLET (20 MEQ TOTAL) BY MOUTH DAILY. (Patient not taking: Reported on 02/02/2024), Disp: 90 tablet, Rfl: 0   QUEtiapine  (SEROQUEL ) 400 MG tablet, Take 1 tablet (400 mg total) by mouth at bedtime., Disp: 30 tablet, Rfl: 1   thiamine  (VITAMIN B1) 100 MG tablet, Take 1 tablet (100 mg total) by mouth daily. (Patient not taking: Reported on 02/02/2024), Disp:  30 tablet, Rfl: 0   topiramate  (TOPAMAX ) 50 MG tablet, Take 50 mg by mouth 2 (two) times daily. (Patient not taking: Reported on 02/02/2024), Disp: , Rfl:    valsartan  (DIOVAN ) 160 MG tablet, Take 1 tablet (160 mg total) by mouth daily. (Patient not taking: Reported on 02/02/2024), Disp: 90 tablet, Rfl: 3  "

## 2024-03-09 ENCOUNTER — Ambulatory Visit: Admitting: Sports Medicine

## 2024-03-13 ENCOUNTER — Ambulatory Visit: Payer: Self-pay | Admitting: Neurology

## 2024-03-13 ENCOUNTER — Encounter: Payer: Self-pay | Admitting: Neurology

## 2024-03-15 ENCOUNTER — Telehealth: Payer: Self-pay | Admitting: Neurology

## 2024-03-15 NOTE — Telephone Encounter (Signed)
 We are writing to inform you that [facility], including all providers within this practice, will no longer be able to provider medical care to you. This decision is the result of repeated missed appointments without adequate notice, which has disrupted our ability to provide timely and effective care to all patients.  03/13/24

## 2024-03-24 ENCOUNTER — Other Ambulatory Visit (HOSPITAL_COMMUNITY): Payer: Self-pay | Admitting: *Deleted

## 2024-03-24 DIAGNOSIS — F4310 Post-traumatic stress disorder, unspecified: Secondary | ICD-10-CM

## 2024-03-24 DIAGNOSIS — F319 Bipolar disorder, unspecified: Secondary | ICD-10-CM

## 2024-03-24 MED ORDER — CLONAZEPAM 0.5 MG PO TABS: 0.5000 mg | ORAL_TABLET | Freq: Three times a day (TID) | ORAL | 0 refills | Status: AC

## 2024-03-28 ENCOUNTER — Other Ambulatory Visit (HOSPITAL_COMMUNITY): Payer: Self-pay | Admitting: *Deleted

## 2024-03-28 DIAGNOSIS — F411 Generalized anxiety disorder: Secondary | ICD-10-CM

## 2024-03-28 DIAGNOSIS — F319 Bipolar disorder, unspecified: Secondary | ICD-10-CM

## 2024-03-28 DIAGNOSIS — F4310 Post-traumatic stress disorder, unspecified: Secondary | ICD-10-CM

## 2024-03-28 MED ORDER — QUETIAPINE FUMARATE 400 MG PO TABS: 400.0000 mg | ORAL_TABLET | Freq: Every day | ORAL | 1 refills | Status: AC

## 2024-04-06 ENCOUNTER — Ambulatory Visit (HOSPITAL_COMMUNITY): Admitting: Psychiatry

## 2024-05-18 ENCOUNTER — Ambulatory Visit: Admitting: Internal Medicine

## 2024-06-13 ENCOUNTER — Ambulatory Visit: Admitting: Neurology
